# Patient Record
Sex: Female | Born: 1935 | Race: White | Hispanic: No | Smoking: Former smoker
Health system: Southern US, Community
[De-identification: ages and names within clinical notes are randomized; demographics above are authoritative.]

## PROBLEM LIST (undated history)

## (undated) DIAGNOSIS — K5909 Other constipation: Secondary | ICD-10-CM

## (undated) DIAGNOSIS — M5416 Radiculopathy, lumbar region: Secondary | ICD-10-CM

## (undated) DIAGNOSIS — IMO0001 Reserved for inherently not codable concepts without codable children: Secondary | ICD-10-CM

## (undated) DIAGNOSIS — E7800 Pure hypercholesterolemia, unspecified: Secondary | ICD-10-CM

## (undated) DIAGNOSIS — F411 Generalized anxiety disorder: Secondary | ICD-10-CM

## (undated) DIAGNOSIS — I700 Atherosclerosis of aorta: Secondary | ICD-10-CM

## (undated) DIAGNOSIS — E785 Hyperlipidemia, unspecified: Secondary | ICD-10-CM

## (undated) DIAGNOSIS — G43909 Migraine, unspecified, not intractable, without status migrainosus: Secondary | ICD-10-CM

## (undated) DIAGNOSIS — K219 Gastro-esophageal reflux disease without esophagitis: Secondary | ICD-10-CM

## (undated) DIAGNOSIS — J40 Bronchitis, not specified as acute or chronic: Secondary | ICD-10-CM

## (undated) DIAGNOSIS — J309 Allergic rhinitis, unspecified: Secondary | ICD-10-CM

## (undated) DIAGNOSIS — M129 Arthropathy, unspecified: Secondary | ICD-10-CM

## (undated) DIAGNOSIS — I10 Essential (primary) hypertension: Secondary | ICD-10-CM

## (undated) DIAGNOSIS — F410 Panic disorder [episodic paroxysmal anxiety] without agoraphobia: Secondary | ICD-10-CM

## (undated) DIAGNOSIS — D509 Iron deficiency anemia, unspecified: Secondary | ICD-10-CM

## (undated) DIAGNOSIS — H269 Unspecified cataract: Secondary | ICD-10-CM

## (undated) DIAGNOSIS — J45909 Unspecified asthma, uncomplicated: Secondary | ICD-10-CM

## (undated) DIAGNOSIS — N3010 Interstitial cystitis (chronic) without hematuria: Secondary | ICD-10-CM

## (undated) HISTORY — PX: ROTATOR CUFF REPAIR: SHX139

## (undated) HISTORY — DX: Essential (primary) hypertension: I10

## (undated) HISTORY — PX: TOTAL SHOULDER ARTHROPLASTY: SHX126

## (undated) HISTORY — PX: APPENDECTOMY: SHX54

## (undated) HISTORY — DX: Unspecified cataract: H26.9

## (undated) HISTORY — PX: BACK SURGERY: SHX140

## (undated) HISTORY — DX: Generalized anxiety disorder: F41.1

## (undated) HISTORY — PX: ABDOMINAL HYSTERECTOMY: SHX81

## (undated) HISTORY — PX: LUMBAR DISC SURGERY: SHX700

## (undated) HISTORY — DX: Radiculopathy, lumbar region: M54.16

## (undated) HISTORY — DX: Panic disorder (episodic paroxysmal anxiety): F41.0

## (undated) HISTORY — DX: Bronchitis, not specified as acute or chronic: J40

## (undated) HISTORY — PX: CATARACT EXTRACTION: SUR2

## (undated) HISTORY — DX: Pure hypercholesterolemia, unspecified: E78.00

## (undated) HISTORY — DX: Atherosclerosis of aorta: I70.0

## (undated) HISTORY — DX: Hyperlipidemia, unspecified: E78.5

## (undated) HISTORY — DX: Other constipation: K59.09

## (undated) HISTORY — PX: DOPPLER ECHOCARDIOGRAPHY: SHX263

## (undated) HISTORY — PX: ESOPHAGOGASTRODUODENOSCOPY ENDOSCOPY: SHX5814

## (undated) HISTORY — PX: JOINT REPLACEMENT: SHX530

## (undated) HISTORY — DX: Reserved for inherently not codable concepts without codable children: IMO0001

## (undated) HISTORY — DX: Allergic rhinitis, unspecified: J30.9

## (undated) HISTORY — PX: TOTAL HIP ARTHROPLASTY: SHX124

## (undated) HISTORY — DX: Iron deficiency anemia, unspecified: D50.9

## (undated) HISTORY — PX: OTHER SURGICAL HISTORY: SHX169

## (undated) HISTORY — DX: Migraine, unspecified, not intractable, without status migrainosus: G43.909

## (undated) HISTORY — DX: Unspecified asthma, uncomplicated: J45.909

## (undated) HISTORY — PX: NM MYOVIEW LTD: HXRAD82

## (undated) HISTORY — PX: COLONOSCOPY: SHX174

## (undated) HISTORY — DX: Arthropathy, unspecified: M12.9

## (undated) HISTORY — DX: Gastro-esophageal reflux disease without esophagitis: K21.9

## (undated) HISTORY — DX: Interstitial cystitis (chronic) without hematuria: N30.10

---

## 1997-10-04 ENCOUNTER — Inpatient Hospital Stay (HOSPITAL_COMMUNITY): Admission: RE | Admit: 1997-10-04 | Discharge: 1997-10-10 | Payer: Self-pay | Admitting: Orthopedic Surgery

## 1997-10-11 ENCOUNTER — Encounter (HOSPITAL_COMMUNITY): Admission: RE | Admit: 1997-10-11 | Discharge: 1998-01-09 | Payer: Self-pay | Admitting: Orthopedic Surgery

## 1998-03-28 ENCOUNTER — Encounter: Payer: Self-pay | Admitting: Orthopedic Surgery

## 1998-04-04 ENCOUNTER — Inpatient Hospital Stay (HOSPITAL_COMMUNITY): Admission: RE | Admit: 1998-04-04 | Discharge: 1998-04-08 | Payer: Self-pay | Admitting: Orthopedic Surgery

## 1998-04-04 ENCOUNTER — Encounter: Payer: Self-pay | Admitting: Orthopedic Surgery

## 1998-04-11 ENCOUNTER — Encounter (HOSPITAL_COMMUNITY): Admission: RE | Admit: 1998-04-11 | Discharge: 1998-07-10 | Payer: Self-pay | Admitting: Orthopedic Surgery

## 1998-05-09 ENCOUNTER — Encounter: Payer: Self-pay | Admitting: Endocrinology

## 1998-05-09 ENCOUNTER — Ambulatory Visit (HOSPITAL_COMMUNITY): Admission: RE | Admit: 1998-05-09 | Discharge: 1998-05-09 | Payer: Self-pay | Admitting: Endocrinology

## 1999-05-15 ENCOUNTER — Encounter: Payer: Self-pay | Admitting: Endocrinology

## 1999-05-15 ENCOUNTER — Ambulatory Visit (HOSPITAL_COMMUNITY): Admission: RE | Admit: 1999-05-15 | Discharge: 1999-05-15 | Payer: Self-pay | Admitting: Endocrinology

## 1999-06-21 ENCOUNTER — Other Ambulatory Visit: Admission: RE | Admit: 1999-06-21 | Discharge: 1999-06-21 | Payer: Self-pay | Admitting: Endocrinology

## 2000-05-16 ENCOUNTER — Encounter: Payer: Self-pay | Admitting: Endocrinology

## 2000-05-16 ENCOUNTER — Ambulatory Visit (HOSPITAL_COMMUNITY): Admission: RE | Admit: 2000-05-16 | Discharge: 2000-05-16 | Payer: Self-pay | Admitting: Endocrinology

## 2000-07-02 ENCOUNTER — Other Ambulatory Visit: Admission: RE | Admit: 2000-07-02 | Discharge: 2000-07-02 | Payer: Self-pay | Admitting: Endocrinology

## 2001-05-25 ENCOUNTER — Encounter: Payer: Self-pay | Admitting: Endocrinology

## 2001-05-25 ENCOUNTER — Ambulatory Visit (HOSPITAL_COMMUNITY): Admission: RE | Admit: 2001-05-25 | Discharge: 2001-05-25 | Payer: Self-pay | Admitting: Endocrinology

## 2001-08-06 ENCOUNTER — Other Ambulatory Visit: Admission: RE | Admit: 2001-08-06 | Discharge: 2001-08-06 | Payer: Self-pay | Admitting: Endocrinology

## 2002-02-16 ENCOUNTER — Encounter: Payer: Self-pay | Admitting: Critical Care Medicine

## 2002-05-17 ENCOUNTER — Encounter: Payer: Self-pay | Admitting: Critical Care Medicine

## 2002-06-14 ENCOUNTER — Ambulatory Visit (HOSPITAL_COMMUNITY): Admission: RE | Admit: 2002-06-14 | Discharge: 2002-06-14 | Payer: Self-pay | Admitting: Endocrinology

## 2002-06-14 ENCOUNTER — Encounter: Payer: Self-pay | Admitting: Endocrinology

## 2002-08-10 ENCOUNTER — Encounter: Payer: Self-pay | Admitting: Gastroenterology

## 2003-07-07 ENCOUNTER — Ambulatory Visit (HOSPITAL_COMMUNITY): Admission: RE | Admit: 2003-07-07 | Discharge: 2003-07-07 | Payer: Self-pay | Admitting: Endocrinology

## 2003-07-20 ENCOUNTER — Other Ambulatory Visit: Admission: RE | Admit: 2003-07-20 | Discharge: 2003-07-20 | Payer: Self-pay | Admitting: Endocrinology

## 2003-08-10 ENCOUNTER — Encounter: Payer: Self-pay | Admitting: Gastroenterology

## 2004-01-10 ENCOUNTER — Emergency Department (HOSPITAL_COMMUNITY): Admission: EM | Admit: 2004-01-10 | Discharge: 2004-01-10 | Payer: Self-pay | Admitting: Emergency Medicine

## 2004-07-09 ENCOUNTER — Ambulatory Visit (HOSPITAL_COMMUNITY): Admission: RE | Admit: 2004-07-09 | Discharge: 2004-07-09 | Payer: Self-pay | Admitting: Endocrinology

## 2005-03-30 ENCOUNTER — Emergency Department (HOSPITAL_COMMUNITY): Admission: EM | Admit: 2005-03-30 | Discharge: 2005-03-30 | Payer: Self-pay | Admitting: Emergency Medicine

## 2005-04-08 ENCOUNTER — Encounter: Admission: RE | Admit: 2005-04-08 | Discharge: 2005-04-08 | Payer: Self-pay | Admitting: Orthopedic Surgery

## 2005-04-12 ENCOUNTER — Inpatient Hospital Stay (HOSPITAL_COMMUNITY): Admission: RE | Admit: 2005-04-12 | Discharge: 2005-04-14 | Payer: Self-pay | Admitting: Orthopedic Surgery

## 2005-07-11 ENCOUNTER — Ambulatory Visit (HOSPITAL_COMMUNITY): Admission: RE | Admit: 2005-07-11 | Discharge: 2005-07-11 | Payer: Self-pay | Admitting: Endocrinology

## 2006-06-04 ENCOUNTER — Ambulatory Visit: Payer: Self-pay | Admitting: Gastroenterology

## 2006-06-30 ENCOUNTER — Encounter: Admission: RE | Admit: 2006-06-30 | Discharge: 2006-06-30 | Payer: Self-pay | Admitting: Orthopedic Surgery

## 2006-08-13 ENCOUNTER — Ambulatory Visit (HOSPITAL_COMMUNITY): Admission: RE | Admit: 2006-08-13 | Discharge: 2006-08-13 | Payer: Self-pay | Admitting: Endocrinology

## 2007-06-17 ENCOUNTER — Emergency Department (HOSPITAL_COMMUNITY): Admission: EM | Admit: 2007-06-17 | Discharge: 2007-06-17 | Payer: Self-pay | Admitting: Emergency Medicine

## 2007-08-05 ENCOUNTER — Encounter: Payer: Self-pay | Admitting: Critical Care Medicine

## 2007-08-12 DIAGNOSIS — F411 Generalized anxiety disorder: Secondary | ICD-10-CM | POA: Insufficient documentation

## 2007-08-12 DIAGNOSIS — E785 Hyperlipidemia, unspecified: Secondary | ICD-10-CM

## 2007-08-12 DIAGNOSIS — I10 Essential (primary) hypertension: Secondary | ICD-10-CM | POA: Insufficient documentation

## 2007-08-12 DIAGNOSIS — M1990 Unspecified osteoarthritis, unspecified site: Secondary | ICD-10-CM | POA: Insufficient documentation

## 2007-08-12 DIAGNOSIS — K219 Gastro-esophageal reflux disease without esophagitis: Secondary | ICD-10-CM | POA: Insufficient documentation

## 2007-08-12 DIAGNOSIS — J309 Allergic rhinitis, unspecified: Secondary | ICD-10-CM | POA: Insufficient documentation

## 2007-08-12 DIAGNOSIS — M797 Fibromyalgia: Secondary | ICD-10-CM

## 2007-08-12 DIAGNOSIS — Z8719 Personal history of other diseases of the digestive system: Secondary | ICD-10-CM

## 2007-08-12 DIAGNOSIS — M129 Arthropathy, unspecified: Secondary | ICD-10-CM

## 2007-08-12 HISTORY — DX: Hyperlipidemia, unspecified: E78.5

## 2007-08-12 HISTORY — DX: Gastro-esophageal reflux disease without esophagitis: K21.9

## 2007-08-12 HISTORY — DX: Allergic rhinitis, unspecified: J30.9

## 2007-08-12 HISTORY — DX: Essential (primary) hypertension: I10

## 2007-08-12 HISTORY — DX: Generalized anxiety disorder: F41.1

## 2007-08-12 HISTORY — DX: Unspecified osteoarthritis, unspecified site: M19.90

## 2007-08-13 ENCOUNTER — Ambulatory Visit: Payer: Self-pay | Admitting: Critical Care Medicine

## 2007-08-14 ENCOUNTER — Ambulatory Visit: Payer: Self-pay | Admitting: Cardiovascular Disease

## 2007-08-15 ENCOUNTER — Encounter: Payer: Self-pay | Admitting: Critical Care Medicine

## 2007-08-18 ENCOUNTER — Encounter: Payer: Self-pay | Admitting: Critical Care Medicine

## 2007-08-31 ENCOUNTER — Ambulatory Visit (HOSPITAL_COMMUNITY): Admission: RE | Admit: 2007-08-31 | Discharge: 2007-08-31 | Payer: Self-pay | Admitting: Endocrinology

## 2007-09-21 ENCOUNTER — Ambulatory Visit: Payer: Self-pay | Admitting: Critical Care Medicine

## 2007-12-10 ENCOUNTER — Ambulatory Visit: Payer: Self-pay | Admitting: Critical Care Medicine

## 2008-03-17 ENCOUNTER — Ambulatory Visit: Payer: Self-pay | Admitting: Critical Care Medicine

## 2008-03-17 ENCOUNTER — Telehealth (INDEPENDENT_AMBULATORY_CARE_PROVIDER_SITE_OTHER): Payer: Self-pay | Admitting: *Deleted

## 2008-03-22 ENCOUNTER — Telehealth (INDEPENDENT_AMBULATORY_CARE_PROVIDER_SITE_OTHER): Payer: Self-pay | Admitting: *Deleted

## 2008-03-23 ENCOUNTER — Ambulatory Visit: Payer: Self-pay | Admitting: Critical Care Medicine

## 2008-03-25 ENCOUNTER — Encounter: Payer: Self-pay | Admitting: Internal Medicine

## 2008-03-25 LAB — HM MAMMOGRAPHY

## 2008-04-01 ENCOUNTER — Telehealth: Payer: Self-pay | Admitting: Adult Health

## 2008-04-25 ENCOUNTER — Telehealth (INDEPENDENT_AMBULATORY_CARE_PROVIDER_SITE_OTHER): Payer: Self-pay | Admitting: *Deleted

## 2008-05-20 ENCOUNTER — Ambulatory Visit: Payer: Self-pay | Admitting: Critical Care Medicine

## 2008-08-18 ENCOUNTER — Ambulatory Visit: Payer: Self-pay | Admitting: Critical Care Medicine

## 2008-09-06 ENCOUNTER — Ambulatory Visit: Payer: Self-pay | Admitting: Critical Care Medicine

## 2008-09-16 ENCOUNTER — Telehealth: Payer: Self-pay | Admitting: Adult Health

## 2008-12-19 ENCOUNTER — Ambulatory Visit: Payer: Self-pay | Admitting: Critical Care Medicine

## 2008-12-19 ENCOUNTER — Ambulatory Visit (HOSPITAL_COMMUNITY): Admission: RE | Admit: 2008-12-19 | Discharge: 2008-12-19 | Payer: Self-pay | Admitting: Endocrinology

## 2008-12-19 DIAGNOSIS — J4530 Mild persistent asthma, uncomplicated: Secondary | ICD-10-CM

## 2008-12-19 HISTORY — DX: Mild persistent asthma, uncomplicated: J45.30

## 2009-01-11 ENCOUNTER — Telehealth: Payer: Self-pay | Admitting: Critical Care Medicine

## 2009-01-11 ENCOUNTER — Ambulatory Visit: Payer: Self-pay | Admitting: Critical Care Medicine

## 2009-03-01 ENCOUNTER — Ambulatory Visit: Payer: Self-pay | Admitting: Critical Care Medicine

## 2009-03-03 ENCOUNTER — Encounter: Payer: Self-pay | Admitting: Critical Care Medicine

## 2009-06-13 ENCOUNTER — Telehealth: Payer: Self-pay | Admitting: Critical Care Medicine

## 2009-06-21 ENCOUNTER — Ambulatory Visit: Payer: Self-pay | Admitting: Critical Care Medicine

## 2009-09-18 ENCOUNTER — Telehealth: Payer: Self-pay | Admitting: Gastroenterology

## 2009-10-04 ENCOUNTER — Telehealth: Payer: Self-pay | Admitting: Critical Care Medicine

## 2009-10-04 ENCOUNTER — Ambulatory Visit: Payer: Self-pay | Admitting: Critical Care Medicine

## 2009-10-12 ENCOUNTER — Encounter: Payer: Self-pay | Admitting: Gastroenterology

## 2009-10-13 ENCOUNTER — Ambulatory Visit: Payer: Self-pay | Admitting: Critical Care Medicine

## 2009-10-13 DIAGNOSIS — J209 Acute bronchitis, unspecified: Secondary | ICD-10-CM

## 2009-10-13 LAB — CONVERTED CEMR LAB
Eosinophils Absolute: 0.2 10*3/uL (ref 0.0–0.7)
Eosinophils Relative: 3.2 % (ref 0.0–5.0)
HCT: 31.5 % — ABNORMAL LOW (ref 36.0–46.0)
Lymphs Abs: 1.3 10*3/uL (ref 0.7–4.0)
MCHC: 33.3 g/dL (ref 30.0–36.0)
MCV: 94.7 fL (ref 78.0–100.0)
Monocytes Absolute: 0.4 10*3/uL (ref 0.1–1.0)
Neutrophils Relative %: 60.8 % (ref 43.0–77.0)
Platelets: 152 10*3/uL (ref 150.0–400.0)
WBC: 4.8 10*3/uL (ref 4.5–10.5)

## 2009-10-16 ENCOUNTER — Encounter: Payer: Self-pay | Admitting: Gastroenterology

## 2009-10-17 ENCOUNTER — Encounter: Payer: Self-pay | Admitting: Gastroenterology

## 2009-10-23 ENCOUNTER — Ambulatory Visit: Payer: Self-pay | Admitting: Gastroenterology

## 2009-10-23 DIAGNOSIS — R1319 Other dysphagia: Secondary | ICD-10-CM

## 2009-10-24 ENCOUNTER — Encounter: Payer: Self-pay | Admitting: Gastroenterology

## 2009-10-24 LAB — CONVERTED CEMR LAB
Ferritin: 68.1 ng/mL (ref 10.0–291.0)
Folate: >20 ng/mL
Iron: 53 ug/dL (ref 42–145)
Vitamin B-12: 496 pg/mL (ref 211–911)

## 2009-11-03 ENCOUNTER — Ambulatory Visit: Payer: Self-pay | Admitting: Critical Care Medicine

## 2009-11-03 DIAGNOSIS — D509 Iron deficiency anemia, unspecified: Secondary | ICD-10-CM | POA: Insufficient documentation

## 2009-11-03 HISTORY — DX: Iron deficiency anemia, unspecified: D50.9

## 2009-11-03 LAB — CONVERTED CEMR LAB
Basophils Relative: 0.3 % (ref 0.0–3.0)
HCT: 31.3 % — ABNORMAL LOW (ref 36.0–46.0)
Hemoglobin: 10.5 g/dL — ABNORMAL LOW (ref 12.0–15.0)
Lymphocytes Relative: 25 % (ref 12.0–46.0)
Lymphs Abs: 1.3 10*3/uL (ref 0.7–4.0)
Monocytes Relative: 7.9 % (ref 3.0–12.0)
Neutro Abs: 3.5 10*3/uL (ref 1.4–7.7)
RBC: 3.26 M/uL — ABNORMAL LOW (ref 3.87–5.11)
RDW: 17.2 % — ABNORMAL HIGH (ref 11.5–14.6)

## 2009-11-08 ENCOUNTER — Encounter: Payer: Self-pay | Admitting: Gastroenterology

## 2009-11-13 ENCOUNTER — Ambulatory Visit: Payer: Self-pay | Admitting: Gastroenterology

## 2009-11-13 DIAGNOSIS — R141 Gas pain: Secondary | ICD-10-CM

## 2009-11-13 DIAGNOSIS — R143 Flatulence: Secondary | ICD-10-CM

## 2009-11-13 DIAGNOSIS — R142 Eructation: Secondary | ICD-10-CM

## 2009-11-14 ENCOUNTER — Ambulatory Visit: Payer: Self-pay | Admitting: Gastroenterology

## 2009-11-16 ENCOUNTER — Encounter: Payer: Self-pay | Admitting: Gastroenterology

## 2009-11-17 ENCOUNTER — Telehealth: Payer: Self-pay | Admitting: Gastroenterology

## 2009-11-17 ENCOUNTER — Ambulatory Visit: Payer: Self-pay | Admitting: Oncology

## 2009-11-20 ENCOUNTER — Encounter: Payer: Self-pay | Admitting: Gastroenterology

## 2009-11-20 LAB — CBC & DIFF AND RETIC
BASO%: 0.4 % (ref 0.0–2.0)
HCT: 37.7 % (ref 34.8–46.6)
Immature Retic Fract: 9 % (ref 0.00–10.70)
MCHC: 31.8 g/dL (ref 31.5–36.0)
MONO#: 0.4 10*3/uL (ref 0.1–0.9)
RBC: 3.88 10*6/uL (ref 3.70–5.45)
Retic %: 0.67 % (ref 0.50–1.50)
WBC: 5.6 10*3/uL (ref 3.9–10.3)
lymph#: 1.2 10*3/uL (ref 0.9–3.3)

## 2009-11-20 LAB — COMPREHENSIVE METABOLIC PANEL
ALT: 25 U/L (ref 0–35)
Alkaline Phosphatase: 65 U/L (ref 39–117)
Sodium: 142 mEq/L (ref 135–145)
Total Bilirubin: 0.5 mg/dL (ref 0.3–1.2)
Total Protein: 6.8 g/dL (ref 6.0–8.3)

## 2010-02-16 ENCOUNTER — Ambulatory Visit: Payer: Self-pay | Admitting: Oncology

## 2010-02-20 LAB — CBC WITH DIFFERENTIAL/PLATELET
BASO%: 0.4 % (ref 0.0–2.0)
EOS%: 2.9 % (ref 0.0–7.0)
HCT: 37.3 % (ref 34.8–46.6)
LYMPH%: 27.2 % (ref 14.0–49.7)
MCH: 30.6 pg (ref 25.1–34.0)
MCHC: 33.9 g/dL (ref 31.5–36.0)
MONO#: 0.3 10*3/uL (ref 0.1–0.9)
NEUT%: 62 % (ref 38.4–76.8)
Platelets: 155 10*3/uL (ref 145–400)

## 2010-02-20 LAB — IRON AND TIBC: %SAT: 24 % (ref 20–55)

## 2010-03-05 ENCOUNTER — Ambulatory Visit: Payer: Self-pay | Admitting: Critical Care Medicine

## 2010-03-13 ENCOUNTER — Ambulatory Visit: Payer: Self-pay | Admitting: Critical Care Medicine

## 2010-04-15 ENCOUNTER — Encounter: Payer: Self-pay | Admitting: Orthopedic Surgery

## 2010-04-16 ENCOUNTER — Ambulatory Visit
Admission: RE | Admit: 2010-04-16 | Discharge: 2010-04-16 | Payer: Self-pay | Source: Home / Self Care | Attending: Critical Care Medicine | Admitting: Critical Care Medicine

## 2010-04-26 NOTE — Assessment & Plan Note (Signed)
Summary: Pulmonary OV   Visit Type:  Follow-up Copy to:  Ranjan sharma Primary Provider/Referring Provider:  Dr. Adela Lank  CC:  Asthma.  The patient has no complaints today. Marland Kitchen  History of Present Illness: 77  years old female who presents with asthma with reflux disease and mild sinusitis as triggers  for the patient's asthma.  The patient had received immunotherapy in the past without improvement.  She had did have positive skin testing.  However, her IgE level obtained at the last visit was only 4.5.  Sinus CT scan showed only mild sinusitis without overt infection.  She is not a Xolair candidate due to the low IgE levels.     October 04, 2009 10:39 AM Doing welll No new issues.   Pt denies any significant sore throat, nasal congestion or excess secretions, fever, chills, sweats, unintended weight loss, pleurtic or exertional chest pain, orthopnea PND, or leg swelling Pt denies any increase in rescue therapy over baseline, denies waking up needing it or having any early am or nocturnal exacerbations of coughing/wheezing/or dyspnea.   Asthma History    Asthma Control Assessment:    Age range: 12+ years    Symptoms: 0-2 days/week    Nighttime Awakenings: 0-2/month    Interferes w/ normal activity: no limitations    SABA use (not for EIB): 0-2 days/week    ATAQ questionnaire: 0    FEV1: 2.05 liters (today)    FEV1 Pred: 2.20 liters (today)    Exacerbations requiring oral systemic steroids: 0-1/year    Asthma Control Assessment: Well Controlled   Preventive Screening-Counseling & Management  Alcohol-Tobacco     Smoking Status: quit > 6 months     Year Quit: 1970     Pack years: 2 cigarettes daily x10 years  Current Medications (verified): 1)  Meloxicam 15 Mg  Tabs (Meloxicam) .... One Tablet Daily 2)  Diovan 160 Mg  Tabs (Valsartan) .... One Tablet Daily. 3)  Vitamin B-1 100 Mg  Tabs (Thiamine Hcl) .... One As Needed 4)  Multivitamins   Tabs (Multiple Vitamin) .... One  Tablet Daily. 5)  Zyrtec Allergy 10 Mg  Tabs (Cetirizine Hcl) .... By Mouth Daily 6)  Singulair 10 Mg  Tabs (Montelukast Sodium) .... One Tablet Daily. 7)  Paxil Cr 25 Mg Xr24h-Tab (Paroxetine Hcl) .Marland Kitchen.. 1 By Mouth Daily 8)  Proair Hfa 108 (90 Base) Mcg/act  Aers (Albuterol Sulfate) .... Inhale 2 Puffs Every 4-6 Hours As Needed. 9)  Skelaxin 800 Mg  Tabs (Metaxalone) .... Take 1/2 As Needed 10)  Crestor 20 Mg  Tabs (Rosuvastatin Calcium) .... One Tablet Daily. 11)  Actos 30 Mg  Tabs (Pioglitazone Hcl) .Marland Kitchen.. 1 By Mouth Daily 12)  Clonazepam 0.5 Mg  Tabs (Clonazepam) .... 1/2 By Mouth Dailly 13)  Pulmicort Flexhaler 180 Mcg/act  Inha (Budesonide (Inhalation)) .... Two  Puffs Twice Daily 14)  Fluticasone Propionate 50 Mcg/act Susp (Fluticasone Propionate) .... Two Sprays Each Nostril Daily  Allergies (verified): 1)  ! * Morphine Sulfate  Past History:  Past medical, surgical, family and social histories (including risk factors) reviewed, and no changes noted (except as noted below).  Past Medical History: Current Problems:  CONSTIPATION, CHRONIC, HX OF (ICD-V12.79) * S/P HYSTERECTOMY ANXIETY (ICD-300.00) FIBROMYALGIA (ICD-729.1) ARTHRITIS (ICD-716.90) HYPERTENSION (ICD-401.9) HYPERLIPIDEMIA (ICD-272.4) G E R D (ICD-530.81) ALLERGIC RHINITIS (ICD-477.9) Asthma Interstitial Cystitis     -Darvin Neighbours GU  Past Surgical History: Reviewed history from 08/12/2007 and no changes required. Cataract Extraction Total Hip Arthroplasty (bilateral)  Status post hysterectomy  Past Pulmonary History:  Pulmonary History:      Exam Type: CT Sinus Results: IMPRESSION: Very mild chronic sinusitis bilaterally.  Pulmonary Function Test  Date: 08/13/2007 Height (in.): 66 Gender: Female  Pre-Spirometry  FVC     Value: 2.68 L/min   Pred: 2.94 L/min     % Pred: 91 % FEV1     Value: 2.05 L     Pred: 2.20 L     % Pred: 93 % FEV1/FVC   Value: 76 %     Pred: 77 %     FEF 25-75   Value: 1.72 L/min    Pred: 1.94 L/min     % Pred: 88 %  Comments:  Normal spirometry  Evaluation:  normal  Family History: Reviewed history from 08/13/2007 and no changes required. non contrib  Social History: Reviewed history from 08/13/2007 and no changes required. Former smoker Married with two children Retired Scientist, clinical (histocompatibility and immunogenetics)  Review of Systems  The patient denies shortness of breath with activity, shortness of breath at rest, productive cough, non-productive cough, coughing up blood, chest pain, irregular heartbeats, acid heartburn, indigestion, loss of appetite, weight change, abdominal pain, difficulty swallowing, sore throat, tooth/dental problems, headaches, nasal congestion/difficulty breathing through nose, sneezing, itching, ear ache, anxiety, depression, hand/feet swelling, joint stiffness or pain, rash, change in color of mucus, and fever.    Vital Signs:  Patient profile:   77 year old female Height:      65 inches (165.10 cm) Weight:      164 pounds (74.55 kg) BMI:     27.39 O2 Sat:      94 % on Room air Temp:     97.7 degrees F (36.50 degrees C) oral Pulse rate:   95 / minute BP sitting:   136 / 70  (right arm) Cuff size:   regular  Vitals Entered By: Michel Bickers CMA (October 04, 2009 10:35 AM)  O2 Sat at Rest %:  94 O2 Flow:  Room air CC: Asthma.  The patient has no complaints today.  Comments Medications reviewed. Daytime phone verified. Michel Bickers [facility]  October 04, 2009 10:36 AM   Physical Exam  Additional Exam:  Gen: Pleasant, well nourished, in no distress ENT: no lesions, clear discharge.  Neck: No JVD, no TMG, no carotid bruits Lungs:clear Cardiovascular: RRR, heart sounds normal, no murmurs or gallops, no peripheral edema Musculoskeletal: No deformities, no cyanosis or clubbing     Pre-Spirometry FEV1    Value: 2.05 L     Pred: 2.20 L     Impression & Recommendations:  Problem # 1:  ASTHMA, EXTRINSIC (ICD-493.00) Assessment Improved  Moderate persistent asthma  improved plan Try to eliminate LABA and go with ICS alone >>>pulmicort   Medications Added to Medication List This Visit: 1)  Pulmicort Flexhaler 180 Mcg/act Inha (Budesonide (inhalation)) .... Two  puffs twice daily  Complete Medication List: 1)  Meloxicam 15 Mg Tabs (Meloxicam) .... One tablet daily 2)  Diovan 160 Mg Tabs (Valsartan) .... One tablet daily. 3)  Vitamin B-1 100 Mg Tabs (Thiamine hcl) .... One as needed 4)  Multivitamins Tabs (Multiple vitamin) .... One tablet daily. 5)  Zyrtec Allergy 10 Mg Tabs (Cetirizine hcl) .... By mouth daily 6)  Singulair 10 Mg Tabs (Montelukast sodium) .... One tablet daily. 7)  Paxil Cr 25 Mg Xr24h-tab (Paroxetine hcl) .Marland Kitchen.. 1 by mouth daily 8)  Proair Hfa 108 (90 Base) Mcg/act Aers (Albuterol sulfate) .... Inhale 2  puffs every 4-6 hours as needed. 9)  Skelaxin 800 Mg Tabs (Metaxalone) .... Take 1/2 as needed 10)  Crestor 20 Mg Tabs (Rosuvastatin calcium) .... One tablet daily. 11)  Actos 30 Mg Tabs (Pioglitazone hcl) .Marland Kitchen.. 1 by mouth daily 12)  Clonazepam 0.5 Mg Tabs (Clonazepam) .... 1/2 by mouth dailly 13)  Pulmicort Flexhaler 180 Mcg/act Inha (Budesonide (inhalation)) .... Two  puffs twice daily 14)  Fluticasone Propionate 50 Mcg/act Susp (Fluticasone propionate) .... Two sprays each nostril daily 15)  Phenazopyridine Hcl 200 Mg Tabs (Phenazopyridine hcl) .... One by mouth two times a day  Other Orders: Est. Patient Level IV (78295)  Patient Instructions: 1)  Stop symbicort one week before cannister runs out then start Pulmicort two puff twice daily 2)  No other medication changes 3)  Call with report on how you are doing on pulmicort 4)  Return 4 months Prescriptions: PULMICORT FLEXHALER 180 MCG/ACT  INHA (BUDESONIDE (INHALATION)) Two  puffs twice daily  #1 x 6   Entered and Authorized by:   Storm Frisk MD   Signed by:   Storm Frisk MD on 10/04/2009   Method used:   Print then Give to Patient   RxID:    [id number]     Appended Document: Pulmonary OV fax dennis kohut

## 2010-04-26 NOTE — Letter (Signed)
Summary: Diabetic Instructions  [facility]  [address] [place], [place] [address]   Phone: [phone]  Fax: [phone]    [name] 15-Sep-1933 MRN: [id number]   _ x _   ORAL DIABETIC MEDICATION INSTRUCTIONS  The day before your procedure:   Take your diabetic pill as you do normally  The day of your procedure:   Do not take your diabetic pill    We will check your blood sugar levels during the admission process and again in Recovery before discharging you home

## 2010-04-26 NOTE — Progress Notes (Signed)
Summary: triage   Phone Note Call from Patient   Caller: Patient Call For: Dr. Russella Dar Reason for Call: Talk to Nurse Summary of Call: would like to be worked in for dysphagia Initial call taken by: Vallarie Mare,  September 18, 2009 3:51 PM  Follow-up for Phone Call        Patient c/o 4-5 episodes of dysphagia mostly with chips.  Patient  advised to eat her food slowly, chew her bites carefully.  She is scheduled to see Dr Russella Dar 10/23/09 3:00 Follow-up by: Darcey Nora RN, CGRN,  September 18, 2009 4:05 PM

## 2010-04-26 NOTE — Progress Notes (Signed)
Summary: talk to nurse  Phone Note Call from Patient Call back at Home Phone [phone]   Caller: Patient Call For: wright Summary of Call: Pt states that PW wanted her to call and give him the name of a new med she was taking, it is phenazopyridine hcl 200mg  and she takes one tab 2x a day. Initial call taken by: Darletta Moll,  October 04, 2009 2:32 PM  Follow-up for Phone Call        called and spoke with pt and she stated that the new med that she is on--phenazopyridine hcl 200mg   1 by mouth two times a day  and she has been taking this for 2 wks.  FYI for Dr. Delford Field. Randell Loop [facility]  October 04, 2009 2:51 PM   Additional Follow-up for Phone Call Additional follow up Details #1::        noted Additional Follow-up by: Storm Frisk MD,  October 04, 2009 3:07 PM    New/Updated Medications: PHENAZOPYRIDINE HCL 200 MG TABS (PHENAZOPYRIDINE HCL) one by mouth two times a day

## 2010-04-26 NOTE — Assessment & Plan Note (Signed)
Summary: F/U APPT, tightness in chest, belching...LSW.   History of Present Illness Visit Type: Follow-up Visit Primary GI MD: Elie Goody MD [facility] Primary Provider: Adela Lank, MD Chief Complaint: Pt states she is having 4-5 firm, black stools per day.  Her GERD is better, although she still has some belching.   History of Present Illness:   This is a return office visit for GERD, dysphagia, diarrhea, and iron deficiency anemia. Her reflux symptoms have improved with the regular use of omeprazole, but she has persistent problems with 4-5 stools per day, increased flatus, and intermittent solid dysphagia. Stools have been black since beginning iron.  Iron studies returned showing a saturation of 16.4%. Her hemoglobin has remained stable at 10.5.   GI Review of Systems    Reports bloating and  dysphagia with solids.      Denies abdominal pain, acid reflux, belching, chest pain, dysphagia with liquids, heartburn, loss of appetite, nausea, vomiting, vomiting blood, weight loss, and  weight gain.      Reports black tarry stools.     Denies anal fissure, change in bowel habit, constipation, diarrhea, diverticulosis, fecal incontinence, heme positive stool, hemorrhoids, irritable bowel syndrome, jaundice, light color stool, liver problems, rectal bleeding, and  rectal pain.   Current Medications (verified): 1)  Meloxicam 15 Mg  Tabs (Meloxicam) .... One Tablet Daily Hold For Now 2)  Diovan 160 Mg  Tabs (Valsartan) .... One Tablet Daily. 3)  Multivitamins   Tabs (Multiple Vitamin) .... One Tablet Daily. 4)  Zyrtec Allergy 10 Mg  Tabs (Cetirizine Hcl) .... By Mouth Daily 5)  Singulair 10 Mg  Tabs (Montelukast Sodium) .... One Tablet Daily. 6)  Paxil Cr 25 Mg Xr24h-Tab (Paroxetine Hcl) .Marland Kitchen.. 1 By Mouth Daily 7)  Proair Hfa 108 (90 Base) Mcg/act  Aers (Albuterol Sulfate) .... Inhale 2 Puffs Every 4-6 Hours As Needed. 8)  Skelaxin 800 Mg  Tabs (Metaxalone) .... Take 1/2 As Needed 9)  Actos 30  Mg  Tabs (Pioglitazone Hcl) .Marland Kitchen.. 1 By Mouth Daily 10)  Clonazepam 0.5 Mg  Tabs (Clonazepam) .... 1/2 By Mouth Dailly 11)  Symbicort 160-4.5 Mcg/act  Aero (Budesonide-Formoterol Fumarate) .... Two Puffs Twice Daily Cancel Pulmicort Rx 12)  Fluticasone Propionate 50 Mcg/act Susp (Fluticasone Propionate) .... Two Sprays Each Nostril Daily 13)  Super Calcium/d 600-125 Mg-Unit Tabs (Calcium-Vitamin D) .... Take Two By Mouth Once Daily 14)  Omeprazole 20 Mg Cpdr (Omeprazole) .... One Tablet By Mouth Once Daily 15)  Iron 325 (65 Fe) Mg Tabs (Ferrous Sulfate) .Marland Kitchen.. 1 By Mouth Two Times A Day 16)  Tylenol Arthritis Pain 650 Mg Cr-Tabs (Acetaminophen) .... Take 2 Tablets By Mouth Two Times A Day  Allergies (verified): 1)  ! * Morphine Sulfate  Past History:  Past Medical History: Reviewed history from 10/23/2009 and no changes required. CONSTIPATION, CHRONIC, HX OF (ICD-V12.79) ANXIETY (ICD-300.00) FIBROMYALGIA (ICD-729.1) ARTHRITIS (ICD-716.90) HYPERTENSION (ICD-401.9) HYPERLIPIDEMIA (ICD-272.4) GERD (ICD-530.81) ALLERGIC RHINITIS (ICD-477.9) Asthma Hemorrhoids, internal and external Interstitial Cystitis     -Darvin Neighbours GU  Past Surgical History: Reviewed history from 10/23/2009 and no changes required. Cataract Extraction bilateral Total Hip Arthroplasty (bilateral) Hysterectomy Ovarian cyst removed Lumbar disc surgery  Family History: Reviewed history from 10/23/2009 and no changes required. non contrib No FH of Colon Cancer: Family History of Heart Disease: father  Social History: Former smoker Married with two children Retired Scientist, clinical (histocompatibility and immunogenetics) Alcohol Use - no Daily Caffeine Use coffee in AM Illicit Drug Use - no  Review of  Systems       The patient complains of arthritis/joint pain and fatigue.         The pertinent positives and negatives are noted as above and in the HPI. All other ROS were reviewed and were negative.   Vital Signs:  Patient profile:   77 year old  female Height:      65 inches Weight:      166 pounds BMI:     27.72 Pulse rate:   64 / minute Pulse rhythm:   irregular BP sitting:   114 / 72  (left arm) Cuff size:   regular  Vitals Entered By: Francee Piccolo CMA Duncan Dull) (November 13, 2009 3:21 PM)  Physical Exam  General:  Well developed, well nourished, no acute distress. Head:  Normocephalic and atraumatic. Eyes:  PERRLA, no icterus. Mouth:  No deformity or lesions, dentition normal. Lungs:  Clear throughout to auscultation. Heart:  Regular rate and rhythm; no murmurs, rubs,  or bruits. Abdomen:  Soft, nontender and nondistended. No masses, hepatosplenomegaly or hernias noted. Normal bowel sounds. Rectal:  deferred until time of colonoscopy.   Psych:  Alert and cooperative. Normal mood and affect.  Impression & Recommendations:  Problem # 1:  ANEMIA, IRON DEFICIENCY (ICD-280.9) Iron deficiency anemia associated with diarrhea, GERD, flatus, and dysphagia. Rule out colorectal neoplasms, IBD, ulcer disease, AVMs, and other disorders. Continue omeprazole 20 mg daily. Continue iron replacement. Continue to avoid Mexilocam until endoscopy and colonoscopy are completed. The risks, benefits and alternatives to colonoscopy with possible biopsy and possible polypectomy were discussed with the patient and they consent to proceed. The procedure will be scheduled electively. The risks, benefits and alternatives to endoscopy with possible biopsy and possible dilation were discussed with the patient and they consent to proceed. The procedure will be scheduled electively.  Problem # 2:  DYSPHAGIA (ICD-787.29) Rule out strictures, neoplasms and esophagitis. EGD as above.  Problem # 3:  DIARRHEA (ICD-787.91) See problem #1.  Problem # 4:  FLATULENCE ERUCTATION AND GAS PAIN (ICD-787.3) Begin Gas-X t.i.d. as needed.  Other Orders: Colon/Endo (Colon/Endo)  Patient Instructions: 1)  Colonoscopy brochure given.  2)  Upper Endoscopy brochure  given.  3)  Copy sent to : Adela Lank, MD 4)  The medication list was reviewed and reconciled.  All changed / newly prescribed medications were explained.  A complete medication list was provided to the patient / caregiver.  Prescriptions: MOVIPREP 100 GM  SOLR (PEG-KCL-NACL-NASULF-NA ASC-C) As per prep instructions.  #1 x 0   Entered by:   Christie Nottingham CMA (AAMA)   Authorized by:   Meryl Dare MD [facility]   Signed by:   Meryl Dare MD [facility] on 11/13/2009   Method used:   Electronically to        [facility]* (retail)       [address] [place], [place]  [address]       Ph: [phone] or [phone]       Fax: [phone]   RxID:   [id number]

## 2010-04-26 NOTE — Letter (Signed)
Summary: [facility] Instructions  [facility]  [address] [place], [place] [address]   Phone: [phone]  Fax: [phone]       [name]    July 01, 1933    MRN: [id number]        Procedure Day /Date: Tuesday August 23rd, 2011     Arrival Time: 12:30pm     Procedure Time: 1:30pmx     Location of Procedure:                    _x _  [facility] (4th Floor)                        PREPARATION FOR COLONOSCOPY WITH MOVIPREP   Starting 5 days prior to your procedure today do not eat nuts, seeds, popcorn, corn, beans, peas,  salads, or any raw vegetables.  Do not take any fiber supplements (e.g. Metamucil, Citrucel, and Benefiber).  THE DAY BEFORE YOUR PROCEDURE         DATE:11/13/09  DAY: Today  1.  Drink clear liquids the entire day-NO SOLID FOOD  2.  Do not drink anything colored red or purple.  Avoid juices with pulp.  No orange juice.  3.  Drink at least 64 oz. (8 glasses) of fluid/clear liquids during the day to prevent dehydration and help the prep work efficiently.  CLEAR LIQUIDS INCLUDE: Water Jello Ice Popsicles Tea (sugar ok, no milk/cream) Powdered fruit flavored drinks Coffee (sugar ok, no milk/cream) Gatorade Juice: apple, white grape, white cranberry  Lemonade Clear bullion, consomm, broth Carbonated beverages (any kind) Strained chicken noodle soup Hard Candy                             4.  In the morning, mix first dose of MoviPrep solution:    Empty 1 Pouch A and 1 Pouch B into the disposable container    Add lukewarm drinking water to the top line of the container. Mix to dissolve    Refrigerate (mixed solution should be used within 24 hrs)  5.  Begin drinking the prep at 5:00 p.m. The MoviPrep container is divided by 4 marks.   Every 15 minutes drink the solution down to the next mark (approximately 8 oz) until the full liter is complete.   6.  Follow completed prep with 16 oz of clear liquid of your choice  (Nothing red or purple).  Continue to drink clear liquids until bedtime.  7.  Before going to bed, mix second dose of MoviPrep solution:    Empty 1 Pouch A and 1 Pouch B into the disposable container    Add lukewarm drinking water to the top line of the container. Mix to dissolve    Refrigerate  THE DAY OF YOUR PROCEDURE      DATE: 11/14/09 DAY: Tuesday  Beginning at 8:30 a.m. (5 hours before procedure):         1. Every 15 minutes, drink the solution down to the next mark (approx 8 oz) until the full liter is complete.  2. Follow completed prep with 16 oz. of clear liquid of your choice.    3. You may drink clear liquids until 11:30am (2 HOURS BEFORE PROCEDURE).   MEDICATION INSTRUCTIONS  Unless otherwise instructed, you should take regular prescription medications with a small sip of water   as early as possible the morning of your procedure.  Diabetic patients - see separate instructions.        OTHER INSTRUCTIONS  You will need a responsible adult at least 75 years of age to accompany you and drive you home.   This person must remain in the waiting room during your procedure.  Wear loose fitting clothing that is easily removed.  Leave jewelry and other valuables at home.  However, you may wish to bring a book to read or  an iPod/MP3 player to listen to music as you wait for your procedure to start.  Remove all body piercing jewelry and leave at home.  Total time from sign-in until discharge is approximately 2-3 hours.  You should go home directly after your procedure and rest.  You can resume normal activities the  day after your procedure.  The day of your procedure you should not:   Drive   Make legal decisions   Operate machinery   Drink alcohol   Return to work  You will receive specific instructions about eating, activities and medications before you leave.    The above instructions have been reviewed and explained to me by   Marchelle Folks.     I  fully understand and can verbalize these instructions _____________________________ Date _________

## 2010-04-26 NOTE — Letter (Signed)
Summary: [facility]  [facility]   Imported By: Lester Alberta 12/07/2009 07:23:18  _____________________________________________________________________  External Attachment:    Type:   Image     Comment:   External Document

## 2010-04-26 NOTE — Progress Notes (Signed)
Summary: Talk to nurse   Phone Note Call from Patient Call back at Home Phone [phone]   Caller: Patient Call For: Dr. Russella Dar Reason for Call: Talk to Nurse Summary of Call: Pt had colon on Tues, take 650mg  iron daily...but concerned that she has not had a BM since her colon.  Please call her at home and advise what she should do.   Initial call taken by: Misty Stanley,  November 17, 2009 8:21 AM  Follow-up for Phone Call        Patient  hasn't had a BM since the colon on Monday.  Patient advised that she can start on Miralax 1-3 times a day until she has a BM then titrate as needed.  patient verblaized understanding she will call back for further questions or concerns. Passing gas, denies any other complaints. Follow-up by: Darcey Nora RN, CGRN,  November 17, 2009 8:45 AM  Additional Follow-up for Phone Call Additional follow up Details #1::        Agree. Additional Follow-up by: Meryl Dare MD Clementeen Graham,  November 17, 2009 9:14 AM

## 2010-04-26 NOTE — Assessment & Plan Note (Signed)
Summary: Pulmonary OV   Copy to:  Ranjan sharma Primary Provider/Referring Provider:  Dr. Adela Lank  CC:  Acute Visit.  c/o prod cough with green mucus, chest tightness, hoarseness, increased SOB with activity x 9days.  Denies wheezing, and f/c/s.  Marland Kitchen  History of Present Illness: 77  years old female who presents with asthma with reflux disease and mild sinusitis as triggers  for the patient's asthma.  The patient had received immunotherapy in the past without improvement.  She had did have positive skin testing.  However, her IgE level obtained at the last visit was only 4.5.  Sinus CT scan showed only mild sinusitis without overt infection.  She is not a Xolair candidate due to the low IgE levels.    October 04, 2009 10:39 AM Doing welll No new issues.   Pt denies any significant sore throat, nasal congestion or excess secretions, fever, chills, sweats, unintended weight loss, pleurtic or exertional chest pain, orthopnea PND, or leg swelling Pt denies any increase in rescue therapy over baseline, denies waking up needing it or having any early am or nocturnal exacerbations of coughing/wheezing/or dyspnea.   October 13, 2009 10:03 AM Micah Flesher outside in heat and did water aerobics and now worse x one week.   Symptoms : tight, green, cough, hoarse, raw in throat, nose is ok,  feels tired,  pt is more dyspneic. Nares are draining.  Sinuses have some pressure.  Pt feels very fatigued.   ? of anemia. At last ov pt was to try pulmicort and stop symbicort, she has not yet done this   Preventive Screening-Counseling & Management  Alcohol-Tobacco     Smoking Status: quit > 6 months     Year Quit: 1970     Pack years: 2 cigarettes daily x10 years  Current Medications (verified): 1)  Meloxicam 15 Mg  Tabs (Meloxicam) .... One Tablet Daily 2)  Diovan 160 Mg  Tabs (Valsartan) .... One Tablet Daily. 3)  Vitamin B-1 100 Mg  Tabs (Thiamine Hcl) .... One As Needed 4)  Multivitamins   Tabs (Multiple  Vitamin) .... One Tablet Daily. 5)  Zyrtec Allergy 10 Mg  Tabs (Cetirizine Hcl) .... By Mouth Daily 6)  Singulair 10 Mg  Tabs (Montelukast Sodium) .... One Tablet Daily. 7)  Paxil Cr 25 Mg Xr24h-Tab (Paroxetine Hcl) .Marland Kitchen.. 1 By Mouth Daily 8)  Proair Hfa 108 (90 Base) Mcg/act  Aers (Albuterol Sulfate) .... Inhale 2 Puffs Every 4-6 Hours As Needed. 9)  Skelaxin 800 Mg  Tabs (Metaxalone) .... Take 1/2 As Needed 10)  Crestor 20 Mg  Tabs (Rosuvastatin Calcium) .... One Tablet Daily. 11)  Actos 30 Mg  Tabs (Pioglitazone Hcl) .Marland Kitchen.. 1 By Mouth Daily 12)  Clonazepam 0.5 Mg  Tabs (Clonazepam) .... 1/2 By Mouth Dailly 13)  Pulmicort Flexhaler 180 Mcg/act  Inha (Budesonide (Inhalation)) .... Two  Puffs Twice Daily 14)  Fluticasone Propionate 50 Mcg/act Susp (Fluticasone Propionate) .... Two Sprays Each Nostril Daily 15)  Phenazopyridine Hcl 200 Mg Tabs (Phenazopyridine Hcl) .... One By Mouth Two Times A Day  Allergies (verified): 1)  ! * Morphine Sulfate  Past History:  Past medical, surgical, family and social histories (including risk factors) reviewed, and no changes noted (except as noted below).  Past Medical History: Reviewed history from 10/04/2009 and no changes required. Current Problems:  CONSTIPATION, CHRONIC, HX OF (ICD-V12.79) * S/P HYSTERECTOMY ANXIETY (ICD-300.00) FIBROMYALGIA (ICD-729.1) ARTHRITIS (ICD-716.90) HYPERTENSION (ICD-401.9) HYPERLIPIDEMIA (ICD-272.4) G E R D (ICD-530.81) ALLERGIC RHINITIS (ICD-477.9)  Asthma Interstitial Cystitis     -Darvin Neighbours GU  Past Surgical History: Reviewed history from 08/12/2007 and no changes required. Cataract Extraction Total Hip Arthroplasty (bilateral) Status post hysterectomy  Past Pulmonary History:  Pulmonary History:      Exam Type: CT Sinus Results: IMPRESSION: Very mild chronic sinusitis bilaterally.  Pulmonary Function Test  Date: 08/13/2007 Height (in.): 66 Gender: Female  Pre-Spirometry  FVC     Value: 2.68  L/min   Pred: 2.94 L/min     % Pred: 91 % FEV1     Value: 2.05 L     Pred: 2.20 L     % Pred: 93 % FEV1/FVC   Value: 76 %     Pred: 77 %     FEF 25-75   Value: 1.72 L/min   Pred: 1.94 L/min     % Pred: 88 %  Comments:  Normal spirometry  Evaluation:  normal  Family History: Reviewed history from 08/13/2007 and no changes required. non contrib  Social History: Reviewed history from 08/13/2007 and no changes required. Former smoker Married with two children Retired Scientist, clinical (histocompatibility and immunogenetics)  Review of Systems       The patient complains of shortness of breath with activity, shortness of breath at rest, productive cough, non-productive cough, nasal congestion/difficulty breathing through nose, and change in color of mucus.  The patient denies coughing up blood, chest pain, irregular heartbeats, acid heartburn, indigestion, loss of appetite, weight change, abdominal pain, difficulty swallowing, sore throat, tooth/dental problems, headaches, sneezing, itching, ear ache, anxiety, depression, hand/feet swelling, joint stiffness or pain, rash, and fever.    Vital Signs:  Patient profile:   77 year old female Height:      65 inches Weight:      164.13 pounds BMI:     27.41 O2 Sat:      90 % on Room air Temp:     98.1 degrees F oral Pulse rate:   80 / minute BP sitting:   140 / 60  (left arm) Cuff size:   regular  Vitals Entered By: Gweneth Dimitri RN (October 13, 2009 9:56 AM)  O2 Flow:  Room air CC: Acute Visit.  c/o prod cough with green mucus, chest tightness, hoarseness, increased SOB with activity x 9days.  Denies wheezing, f/c/s.   Comments Medications reviewed with patient Daytime contact number verified with patient. Gweneth Dimitri RN  October 13, 2009 9:57 AM    Physical Exam  Additional Exam:  Gen: Pleasant, well nourished, in no distress ENT: no lesions, clear discharge.  Neck: No JVD, no TMG, no carotid bruits Lungs:inspir/exp wheeze,   Cardiovascular: RRR, heart sounds normal, no  murmurs or gallops, no peripheral edema Musculoskeletal: No deformities, no cyanosis or clubbing   White Cell Count          4.8 K/uL                    4.5-10.5   Red Cell Count       [L]  3.32 Mil/uL                 3.87-5.11   Hemoglobin           [L]  10.5 g/dL                   16.1-09.6   Hematocrit           [L]  31.5 %  36.0-46.0   MCV                       94.7 fl                     78.0-100.0   MCHC                      33.3 g/dL                   35.0-09.3   RDW                  [H]  16.5 %                      11.5-14.6   Platelet Count            152.0 K/uL                  150.0-400.0   Neutrophil %              60.8 %                      43.0-77.0   Lymphocyte %              27.0 %                      12.0-46.0   Monocyte %                8.5 %                       3.0-12.0   Eosinophils%              3.2 %                       0.0-5.0   Basophils %               0.5 %                       0.0-3.0   Neutrophill Absolute      2.9 K/uL                    1.4-7.7   Lymphocyte Absolute       1.3 K/uL                    0.7-4.0   Monocyte Absolute         0.4 K/uL                    0.1-1.0  Eosinophils, Absolute                             0.2 K/uL                    0.0-0.7   Basophils Absolute        0.0 K/uL                    0.0-0.1  Impression & Recommendations:  Problem # 1:  ACUTE BRONCHITIS (ICD-466.0) Assessment Deteriorated acute tracheobronchitis with flare plan augmenin x 7days pulse prednisone cont inhaled meds, do not change to pulmicort,  maintain symbicort for now Her updated medication list for this problem includes:    Singulair 10 Mg Tabs (Montelukast sodium) ..... One tablet daily.    Proair Hfa 108 (90 Base) Mcg/act Aers (Albuterol sulfate) ..... Inhale 2 puffs every 4-6 hours as needed.    Symbicort 160-4.5 Mcg/act Aero (Budesonide-formoterol fumarate) .Marland Kitchen..Marland Kitchen Two puffs twice daily cancel pulmicort rx    Amoxicillin-pot  Clavulanate 875-125 Mg Tabs (Amoxicillin-pot clavulanate) ..... One by mouth two times a day  Orders: Est. Patient Level IV (16109) Prescription Created Electronically [phone] [id number]) TLB-CBC Platelet - w/Differential (85025-CBCD)  Problem # 2:  G E R D (ICD-530.81) Assessment: Unchanged no true gerd hx but pt fatigued and has hx of gastritis in past plan note CBC reveals normocytic normochromic anemia ? cause?   f/u with pcp dr Juleen China.  Medications Added to Medication List This Visit: 1)  Symbicort 160-4.5 Mcg/act Aero (Budesonide-formoterol fumarate) .... Two puffs twice daily cancel pulmicort rx 2)  Prednisone 10 Mg Tabs (Prednisone) .... Take as directed 4 each am x3days, 3 x 3days, 2 x 3days, 1 x 3days then stop 3)  Amoxicillin-pot Clavulanate 875-125 Mg Tabs (Amoxicillin-pot clavulanate) .... One by mouth two times a day  Complete Medication List: 1)  Meloxicam 15 Mg Tabs (Meloxicam) .... One tablet daily 2)  Diovan 160 Mg Tabs (Valsartan) .... One tablet daily. 3)  Vitamin B-1 100 Mg Tabs (Thiamine hcl) .... One as needed 4)  Multivitamins Tabs (Multiple vitamin) .... One tablet daily. 5)  Zyrtec Allergy 10 Mg Tabs (Cetirizine hcl) .... By mouth daily 6)  Singulair 10 Mg Tabs (Montelukast sodium) .... One tablet daily. 7)  Paxil Cr 25 Mg Xr24h-tab (Paroxetine hcl) .Marland Kitchen.. 1 by mouth daily 8)  Proair Hfa 108 (90 Base) Mcg/act Aers (Albuterol sulfate) .... Inhale 2 puffs every 4-6 hours as needed. 9)  Skelaxin 800 Mg Tabs (Metaxalone) .... Take 1/2 as needed 10)  Crestor 20 Mg Tabs (Rosuvastatin calcium) .... One tablet daily. 11)  Actos 30 Mg Tabs (Pioglitazone hcl) .Marland Kitchen.. 1 by mouth daily 12)  Clonazepam 0.5 Mg Tabs (Clonazepam) .... 1/2 by mouth dailly 13)  Symbicort 160-4.5 Mcg/act Aero (Budesonide-formoterol fumarate) .... Two puffs twice daily cancel pulmicort rx 14)  Fluticasone Propionate 50 Mcg/act Susp (Fluticasone propionate) .... Two sprays each nostril daily 15)  Phenazopyridine  Hcl 200 Mg Tabs (Phenazopyridine hcl) .... One by mouth two times a day 16)  Prednisone 10 Mg Tabs (Prednisone) .... Take as directed 4 each am x3days, 3 x 3days, 2 x 3days, 1 x 3days then stop 17)  Amoxicillin-pot Clavulanate 875-125 Mg Tabs (Amoxicillin-pot clavulanate) .... One by mouth two times a day  Patient Instructions: 1)  Augmentin one two times a day x 7days 2)  Prednisone 10mg  4 each am x3days, 3 x 3days, 2 x 3days, 1 x 3days then stop 3)  Do not start pulmicort 4)  STay on symbicort two puff two times a day  5)  Labs today 6)  Return 3 weeks for recheck Prescriptions: AMOXICILLIN-POT CLAVULANATE 875-125 MG TABS (AMOXICILLIN-POT CLAVULANATE) one by mouth two times a day  #14 x 0   Entered and Authorized by:   Storm Frisk MD   Signed by:   Storm Frisk MD on 10/13/2009   Method used:   Electronically to        [facility]* (retail)       [address]       [place]  [address] [place], [place]  [address]       Ph: [phone] or [phone]       Fax: [phone]   RxID:   [id number] PREDNISONE 10 MG  TABS (PREDNISONE) Take as directed 4 each am x3days, 3 x 3days, 2 x 3days, 1 x 3days then stop  #30 x 0   Entered and Authorized by:   Storm Frisk MD   Signed by:   Storm Frisk MD on 10/13/2009   Method used:   Electronically to        [facility]* (retail)       [address] [place], [place]  [address]       Ph: [phone] or [phone]       Fax: [phone]   RxID:   [id number] SYMBICORT 160-4.5 MCG/ACT  AERO (BUDESONIDE-FORMOTEROL FUMARATE) Two puffs twice daily cancel pulmicort Rx  #1 x 6   Entered and Authorized by:   Storm Frisk MD   Signed by:   Storm Frisk MD on 10/13/2009   Method used:   Electronically to        [facility]* (retail)       [address] [place], [place]  [address]       Ph: [phone] or [phone]       Fax: [phone]   RxID:   [id number]   Appended Document: Pulmonary OV fax dennis kohut

## 2010-04-26 NOTE — Procedures (Signed)
Summary: EGD   EGD  Procedure date:  08/10/2003  Findings:      Findings: Gastritis  Location: [facility]   Patient Name: [name], [name]. MRN:  Procedure Procedures: Panendoscopy (EGD) CPT: 43235.    with biopsy(s)/brushing(s). CPT: D1846139.  Personnel: Endoscopist: Venita Lick. Russella Dar, MD, Clementeen Graham.  Exam Location: Exam performed in Outpatient Clinic. Outpatient  Patient Consent: Procedure, Alternatives, Risks and Benefits discussed, consent obtained, from patient. Consent was obtained by the RN.  Indications Symptoms: Dysphagia. Chest Pain. Nausea. Vomiting.  History  Current Medications: Patient is not currently taking Coumadin.  Pre-Exam Physical: Performed Aug 10, 2003  Entire physical exam was normal.  Exam Exam Info: Maximum depth of insertion Duodenum, intended Duodenum. Vocal cords not visualized. Gastric retroflexion performed. ASA Classification: II. Tolerance: excellent.  Sedation Meds: Patient assessed and found to be appropriate for moderate (conscious) sedation. Fentanyl 50 mcg. given IV. Versed 5 mg. given IV. Cetacaine Spray 2 sprays given aerosolized.  Monitoring: BP and pulse monitoring done. Oximetry used. Supplemental O2 given  Findings Normal: Proximal Esophagus to Body.  Normal: Duodenal Bulb to Duodenal 2nd Portion.  MUCOSAL ABNORMALITY: Antrum to Pyloric Sphincter. Erythematous mucosa. RUT done, results pending. ICD9: Gastritis, Unspecified: 535.50.   Assessment  Diagnoses: 535.50: Gastritis, Unspecified.   Events  Unplanned Intervention: No unplanned interventions were required.  Unplanned Events: There were no complications. Plans Medication(s): Await pathology. Continue current medications. PPI: Pantoprazole/Protonix 40 mg QAM,   Patient Education: Patient given standard instructions for: Reflux. Mucosal Abnormality.  Disposition: After procedure patient sent to recovery. After recovery patient sent home.    Scheduling: Office Visit, to [employer]. Russella Dar, MD, Clementeen Graham, prn  Primary Care Provider, to Adela Lank, MD,    This report was created from the original endoscopy report, which was reviewed and signed by the above listed endoscopist.    cc: Adela Lank, MD   RUT Negative

## 2010-04-26 NOTE — Assessment & Plan Note (Signed)
Summary: NP follow up - bronchitis   Copy to:  Ranjan sharma Primary Provider/Referring Provider:  Adela Lank, MD  CC:  still having tightness in chest, dry cough, chest congestion - states does feel better than last ov, and finished zpak and pred taper.  was given cortisone injection 4-5days ago by Dr. Teressa Senter which she states helped.Marland Kitchen  History of Present Illness: 77  years old female who presents with asthma with reflux disease and mild sinusitis as triggers  for the patient's asthma.  The patient had received immunotherapy in the past without improvement.  She had did have positive skin testing.  However, her IgE level obtained at the last visit was only 4.5.  Sinus CT scan showed only mild sinusitis without overt infection.  She is not a Xolair candidate due to the low IgE levels.   October 13, 2009 10:03 AM Micah Flesher outside in heat and did water aerobics and now worse x one week.   Symptoms : tight, green, cough, hoarse, raw in throat, nose is ok,  feels tired,  pt is more dyspneic. Nares are draining.  Sinuses have some pressure.  Pt feels very fatigued.   ? of anemia. At last ov pt was to try pulmicort and stop symbicort, she has not yet done this  November 03, 2009 2:20 PM Pt took pred and abx for sinusitis.  Now is better but is having reflux.  Has Fe and this causes burping and belching.  The patient notes now is less dyspneic. There is no cough or wheezing.  There is no chest pain.     March 05, 2010 -Presents for an acute office visit. Complains of prod cough with small amount of yellow mucus, wheezing, hoarseness, some increased SOB x3-4 days. She is under enormous stress. 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 "do not have time to be sick". Also has cold sore for last 1 week on top lips. Denies chest pain, , orthopnea, hemoptysis, fever, n/v/d, edema, headache,recent travel or antibiotics.   March 13, 2010--Presents for follow up. Tx last week for bronchitis w/ zpack and  prednisone taper. Returns she is improved however not completely well yet. Complains she is still having tightness in chest, dry cough, chest congestion - states does feel better than last ov. No discolored mucus or fever. just not back to baseline. Husband going for Bypass surgery 03/30/10 , needs to be well. Denies chest pain, orthopnea, hemoptysis, fever, n/v/d, headache.   Medications Prior to Update: 1)  Meloxicam 15 Mg  Tabs (Meloxicam) .... One Tablet Daily Hold For Now 2)  Diovan 160 Mg  Tabs (Valsartan) .... One Tablet Daily. 3)  Multivitamins   Tabs (Multiple Vitamin) .... One Tablet Daily. 4)  Zyrtec Allergy 10 Mg  Tabs (Cetirizine Hcl) .... By Mouth Daily 5)  Singulair 10 Mg  Tabs (Montelukast Sodium) .... One Tablet Daily. 6)  Paxil Cr 25 Mg Xr24h-Tab (Paroxetine Hcl) .Marland Kitchen.. 1 By Mouth Daily 7)  Proair Hfa 108 (90 Base) Mcg/act  Aers (Albuterol Sulfate) .... Inhale 2 Puffs Every 4-6 Hours As Needed. 8)  Skelaxin 800 Mg  Tabs (Metaxalone) .... Take 1/2 As Needed 9)  Actos 30 Mg  Tabs (Pioglitazone Hcl) .Marland Kitchen.. 1 By Mouth Daily 10)  Clonazepam 0.5 Mg  Tabs (Clonazepam) .... 1/2 By Mouth Dailly 11)  Symbicort 160-4.5 Mcg/act  Aero (Budesonide-Formoterol Fumarate) .... Two Puffs Twice Daily Cancel Pulmicort Rx 12)  Fluticasone Propionate 50 Mcg/act Susp (Fluticasone Propionate) .... Two Sprays Each Nostril Daily  13)  Super Calcium/d 600-125 Mg-Unit Tabs (Calcium-Vitamin D) .... Take Two By Mouth Once Daily 14)  Omeprazole 20 Mg Cpdr (Omeprazole) .... One Tablet By Mouth Once Daily 15)  Iron 325 (65 Fe) Mg Tabs (Ferrous Sulfate) .Marland Kitchen.. 1 By Mouth Two Times A Day 16)  Tylenol Arthritis Pain 650 Mg Cr-Tabs (Acetaminophen) .... Take 2 Tablets By Mouth Two Times A Day 17)  Zithromax Z-Pak 250 Mg Tabs (Azithromycin) .... Take As Directed 18)  Valtrex 1 Gm Tabs (Valacyclovir Hcl) .... 2 By Mouth Two Times A Day X 1 Day 19)  Hydromet 5-1.5 Mg/52ml Syrp (Hydrocodone-Homatropine) .Marland Kitchen.. 1-2 Tsp Every 4-6 Hrs  As Needed Cough , May Make You Sleepy 20)  Prednisone 10 Mg Tabs (Prednisone) .... 4 Tabs For 2 Days, Then 3 Tabs For 2 Days, 2 Tabs For 2 Days, Then 1 Tab For 2 Days, Then Stop  Current Medications (verified): 1)  Meloxicam 15 Mg  Tabs (Meloxicam) .... One Tablet Daily Hold For Now 2)  Diovan 160 Mg  Tabs (Valsartan) .... One Tablet Daily. 3)  Multivitamins   Tabs (Multiple Vitamin) .... One Tablet Daily. 4)  Zyrtec Allergy 10 Mg  Tabs (Cetirizine Hcl) .... By Mouth Daily 5)  Singulair 10 Mg  Tabs (Montelukast Sodium) .... One Tablet Daily. 6)  Paxil Cr 25 Mg Xr24h-Tab (Paroxetine Hcl) .Marland Kitchen.. 1 By Mouth Daily 7)  Proair Hfa 108 (90 Base) Mcg/act  Aers (Albuterol Sulfate) .... Inhale 2 Puffs Every 4-6 Hours As Needed. 8)  Skelaxin 800 Mg  Tabs (Metaxalone) .... Take 1/2 As Needed 9)  Actos 30 Mg  Tabs (Pioglitazone Hcl) .Marland Kitchen.. 1 By Mouth Daily 10)  Clonazepam 0.5 Mg  Tabs (Clonazepam) .... 1/2 By Mouth Dailly 11)  Symbicort 160-4.5 Mcg/act  Aero (Budesonide-Formoterol Fumarate) .... Two Puffs Twice Daily Cancel Pulmicort Rx 12)  Fluticasone Propionate 50 Mcg/act Susp (Fluticasone Propionate) .... Two Sprays Each Nostril Daily 13)  Super Calcium/d 600-125 Mg-Unit Tabs (Calcium-Vitamin D) .... Take Two By Mouth Once Daily 14)  Omeprazole 20 Mg Cpdr (Omeprazole) .... One Tablet By Mouth Once Daily 15)  Iron 325 (65 Fe) Mg Tabs (Ferrous Sulfate) .Marland Kitchen.. 1 By Mouth Two Times A Day 16)  Tylenol Arthritis Pain 650 Mg Cr-Tabs (Acetaminophen) .... Take 2 Tablets By Mouth Two Times A Day 17)  Hydromet 5-1.5 Mg/73ml Syrp (Hydrocodone-Homatropine) .Marland Kitchen.. 1-2 Tsp Every 4-6 Hrs As Needed Cough , May Make You Sleepy  Allergies (verified): 1)  ! * Morphine Sulfate  Past History:  Past Medical History: Last updated: 10/23/2009 CONSTIPATION, CHRONIC, HX OF (ICD-V12.79) ANXIETY (ICD-300.00) FIBROMYALGIA (ICD-729.1) ARTHRITIS (ICD-716.90) HYPERTENSION (ICD-401.9) HYPERLIPIDEMIA (ICD-272.4) GERD  (ICD-530.81) ALLERGIC RHINITIS (ICD-477.9) Asthma Hemorrhoids, internal and external Interstitial Cystitis     -Darvin Neighbours GU  Past Surgical History: Last updated: 10/23/2009 Cataract Extraction bilateral Total Hip Arthroplasty (bilateral) Hysterectomy Ovarian cyst removed Lumbar disc surgery  Family History: Last updated: 10/23/2009 non contrib No FH of Colon Cancer: Family History of Heart Disease: father  Social History: Last updated: 11/13/2009 Former smoker Married with two children Retired Med [employer] Alcohol Use - no Daily Caffeine Use coffee in AM Illicit Drug Use - no  Risk Factors: Smoking Status: quit > 6 months (11/03/2009)  Past Pulmonary History:  Pulmonary History:      Exam Type: CT Sinus Results: IMPRESSION: Very mild chronic sinusitis bilaterally.  Pulmonary Function Test  Date: 08/13/2007 Height (in.): 66 Gender: Female  Pre-Spirometry  FVC     Value: 2.68 L/min   Pred:  2.94 L/min     % Pred: 91 % FEV1     Value: 2.05 L     Pred: 2.20 L     % Pred: 93 % FEV1/FVC   Value: 76 %     Pred: 77 %     FEF 25-75   Value: 1.72 L/min   Pred: 1.94 L/min     % Pred: 88 %  Comments:  Normal spirometry  Evaluation:  normal  Review of Systems      See HPI  Vital Signs:  Patient profile:   77 year old female Height:      65 inches Weight:      161.50 pounds BMI:     26.97 O2 Sat:      99 % on Room air Temp:     97.6 degrees F oral Pulse rate:   80 / minute BP sitting:   130 / 82  (left arm) Cuff size:   regular  Vitals Entered By: Boone Master CNA/MA (March 13, 2010 2:15 PM)  O2 Flow:  Room air CC: still having tightness in chest, dry cough, chest congestion - states does feel better than last ov, finished zpak and pred taper.  was given cortisone injection 4-5days ago by Dr. Teressa Senter which she states helped. Is Patient Diabetic? No Comments Medications reviewed with patient Daytime contact number verified with patient. Boone Master  CNA/MA  March 13, 2010 2:14 PM    Physical Exam  Additional Exam:  Gen: Pleasant, well nourished, in no distress ENT: no lesions, clear discharge, post pharynx clear  Neck: No JVD, no TMG, no carotid bruits Lungs coarse BS w/ no wheezing  Cardiovascular: RRR, heart sounds normal, no murmurs or gallops, no peripheral edema Musculoskeletal: No deformities, no cyanosis or clubbing     Impression & Recommendations:  Problem # 1:  ACUTE BRONCHITIS (ICD-466.0)  Slow to resolve flare.  No further abx or steroids at this time.  xopenex neb in office  Plan:  Mucinex DM two times a day as needed cough/congestion  Hydromet 1-2 tsp every 4-6 hrs as needed cough/congestion  Increase fluids and rest .  Please contact office for sooner follow up if symptoms do not improve or worsen  The following medications were removed from the medication list:    Zithromax Z-pak 250 Mg Tabs (Azithromycin) .Marland Kitchen... Take as directed Her updated medication list for this problem includes:    Singulair 10 Mg Tabs (Montelukast sodium) ..... One tablet daily.    Proair Hfa 108 (90 Base) Mcg/act Aers (Albuterol sulfate) ..... Inhale 2 puffs every 4-6 hours as needed.    Symbicort 160-4.5 Mcg/act Aero (Budesonide-formoterol fumarate) .Marland Kitchen..Marland Kitchen Two puffs twice daily cancel pulmicort rx    Hydromet 5-1.5 Mg/35ml Syrp (Hydrocodone-homatropine) .Marland Kitchen... 1-2 tsp every 4-6 hrs as needed cough , may make you sleepy  Orders: Est. Patient Level III (11914)  Patient Instructions: 1)  Mucinex DM two times a day as needed cough/congestion  2)  Hydromet 1-2 tsp every 4-6 hrs as needed cough/congestion  3)  Increase fluids and rest .  4)  Please contact office for sooner follow up if symptoms do not improve or worsen

## 2010-04-26 NOTE — Assessment & Plan Note (Signed)
Summary: Pulmonary OV   Copy to:  Ranjan sharma Primary Provider/Referring Provider:  Adela Lank, MD  CC:  1 month follow up.  Pt states she does have some chest tightness, sinus congestion, drainage, and prod cough with light yellow mucus when using netti pot x 1 month.  Denies SOB.Marland Kitchen  History of Present Illness: 77  years old female who presents with asthma with reflux disease and mild sinusitis as triggers  for the patient's asthma.  The patient had received immunotherapy in the past without improvement.  She had did have positive skin testing.  However, her IgE level obtained at the last visit was only 4.5.  Sinus CT scan showed only mild sinusitis without overt infection.  She is not a Xolair candidate due to the low IgE levels.   October 13, 2009 10:03 AM Micah Flesher outside in heat and did water aerobics and now worse x one week.   Symptoms : tight, green, cough, hoarse, raw in throat, nose is ok,  feels tired,  pt is more dyspneic. Nares are draining.  Sinuses have some pressure.  Pt feels very fatigued.   ? of anemia. At last ov pt was to try pulmicort and stop symbicort, she has not yet done this  November 03, 2009 2:20 PM Pt took pred and abx for sinusitis.  Now is better but is having reflux.  Has Fe and this causes burping and belching.  The patient notes now is less dyspneic. There is no cough or wheezing.  There is no chest pain.     March 05, 2010 -Presents for an acute office visit. Complains of prod cough with small amount of yellow mucus, wheezing, hoarseness, some increased SOB x3-4 days. She is under enormous stress. 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 "do not have time to be sick". Also has cold sore for last 1 week on top lips. Denies chest pain, , orthopnea, hemoptysis, fever, n/v/d, edema, headache,recent travel or antibiotics.   March 13, 2010--Presents for follow up. Tx last week for bronchitis w/ zpack and prednisone taper. Returns she is improved however not  completely well yet. Complains she is still having tightness in chest, dry cough, chest congestion - states does feel better than last ov. No discolored mucus or fever. just not back to baseline. Husband going for Bypass surgery 03/30/10 , needs to be well. Denies chest pain, orthopnea, hemoptysis, fever, n/v/d, headache. April 16, 2010 9:44 AM Had two OVs for bronchitis and sinusitis in 12/11.  Uses nedi pot.  Not coughing now.  Now no wheeze.  No real chest pain.  Has occ chest tightness.   Pt overall now is better.  No other new issues.  Current Medications (verified): 1)  Meloxicam 15 Mg  Tabs (Meloxicam) .... One Tablet Daily Hold For Now 2)  Diovan 160 Mg  Tabs (Valsartan) .... One Tablet Daily. 3)  Multivitamins   Tabs (Multiple Vitamin) .... One Tablet Daily. 4)  Zyrtec Allergy 10 Mg  Tabs (Cetirizine Hcl) .... By Mouth Daily 5)  Singulair 10 Mg  Tabs (Montelukast Sodium) .... One Tablet Daily. 6)  Paxil Cr 25 Mg Xr24h-Tab (Paroxetine Hcl) .Marland Kitchen.. 1 By Mouth Daily 7)  Proair Hfa 108 (90 Base) Mcg/act  Aers (Albuterol Sulfate) .... Inhale 2 Puffs Every 4-6 Hours As Needed. 8)  Skelaxin 800 Mg  Tabs (Metaxalone) .... Take 1/2 As Needed 9)  Actos 30 Mg  Tabs (Pioglitazone Hcl) .Marland Kitchen.. 1 By Mouth Daily 10)  Clonazepam 0.5 Mg  Tabs (Clonazepam) .... 1/2 By Mouth Dailly 11)  Symbicort 160-4.5 Mcg/act  Aero (Budesonide-Formoterol Fumarate) .... Two Puffs Twice Daily Cancel Pulmicort Rx 12)  Fluticasone Propionate 50 Mcg/act Susp (Fluticasone Propionate) .... Two Sprays Each Nostril Daily 13)  Super Calcium/d 600-125 Mg-Unit Tabs (Calcium-Vitamin D) .... Take Two By Mouth Once Daily 14)  Omeprazole 20 Mg Cpdr (Omeprazole) .... One Tablet By Mouth Once Daily 15)  Iron 325 (65 Fe) Mg Tabs (Ferrous Sulfate) .Marland Kitchen.. 1 By Mouth Two Times A Day 16)  Tylenol Arthritis Pain 650 Mg Cr-Tabs (Acetaminophen) .... Take 2 Tablets By Mouth Two Times A Day As Needed  Allergies (verified): 1)  ! * Morphine  Sulfate  Past History:  Past medical, surgical, family and social histories (including risk factors) reviewed, and no changes noted (except as noted below).  Past Medical History: Reviewed history from 10/23/2009 and no changes required. CONSTIPATION, CHRONIC, HX OF (ICD-V12.79) ANXIETY (ICD-300.00) FIBROMYALGIA (ICD-729.1) ARTHRITIS (ICD-716.90) HYPERTENSION (ICD-401.9) HYPERLIPIDEMIA (ICD-272.4) GERD (ICD-530.81) ALLERGIC RHINITIS (ICD-477.9) Asthma Hemorrhoids, internal and external Interstitial Cystitis     -[facility]  Past Surgical History: Reviewed history from 10/23/2009 and no changes required. Cataract Extraction bilateral Total Hip Arthroplasty (bilateral) Hysterectomy Ovarian cyst removed Lumbar disc surgery  Past Pulmonary History:  Pulmonary History:      Exam Type: CT Sinus Results: IMPRESSION: Very mild chronic sinusitis bilaterally.  Pulmonary Function Test  Date: 08/13/2007 Height (in.): 66 Gender: Female  Pre-Spirometry  FVC     Value: 2.68 L/min   Pred: 2.94 L/min     % Pred: 91 % FEV1     Value: 2.05 L     Pred: 2.20 L     % Pred: 93 % FEV1/FVC   Value: 76 %     Pred: 77 %     FEF 25-75   Value: 1.72 L/min   Pred: 1.94 L/min     % Pred: 88 %  Comments:  Normal spirometry  Evaluation:  normal  Family History: Reviewed history from 10/23/2009 and no changes required. non contrib No FH of Colon Cancer: Family History of Heart Disease: father  Social History: Reviewed history from 11/13/2009 and no changes required. Former smoker Married with two children Retired Scientist, clinical (histocompatibility and immunogenetics) Alcohol Use - no Daily Caffeine Use coffee in AM Illicit Drug Use - no  Review of Systems       The patient complains of shortness of breath with activity.  The patient denies shortness of breath at rest, productive cough, non-productive cough, coughing up blood, chest pain, irregular heartbeats, acid heartburn, indigestion, loss of appetite, weight change,  abdominal pain, difficulty swallowing, sore throat, tooth/dental problems, headaches, nasal congestion/difficulty breathing through nose, sneezing, itching, ear ache, anxiety, depression, hand/feet swelling, joint stiffness or pain, rash, change in color of mucus, and fever.    Vital Signs:  Patient profile:   77 year old female Height:      65 inches Weight:      164.25 pounds BMI:     27.43 O2 Sat:      99 % on Room air Temp:     98.3 degrees F oral Pulse rate:   83 / minute BP sitting:   140 / 88  (right arm) Cuff size:   regular  Vitals Entered By: Gweneth Dimitri RN (April 16, 2010 9:38 AM)  O2 Flow:  Room air CC: 1 month follow up.  Pt states she does have some chest tightness, sinus congestion, drainage, prod cough with light  yellow mucus when using netti pot x 1 month.  Denies SOB. Comments Medications reviewed with patient Daytime contact number verified with patient. Gweneth Dimitri RN  April 16, 2010 9:38 AM    Physical Exam  Additional Exam:  Gen: Pleasant, well nourished, in no distress ENT: no lesions, clear discharge, post pharynx clear  Neck: No JVD, no TMG, no carotid bruits Lungs coarse BS w/ no wheezing  Cardiovascular: RRR, heart sounds normal, no murmurs or gallops, no peripheral edema Musculoskeletal: No deformities, no cyanosis or clubbing     Impression & Recommendations:  Problem # 1:  ASTHMA, EXTRINSIC (ICD-493.00) Assessment Unchanged  Moderate persistent asthma improved plan stay on Laba/ics for now  Medications Added to Medication List This Visit: 1)  Tylenol Arthritis Pain 650 Mg Cr-tabs (Acetaminophen) .... Take 2 tablets by mouth two times a day as needed  Complete Medication List: 1)  Meloxicam 15 Mg Tabs (Meloxicam) .... One tablet daily hold for now 2)  Diovan 160 Mg Tabs (Valsartan) .... One tablet daily. 3)  Multivitamins Tabs (Multiple vitamin) .... One tablet daily. 4)  Zyrtec Allergy 10 Mg Tabs (Cetirizine hcl) .... By mouth  daily 5)  Singulair 10 Mg Tabs (Montelukast sodium) .... One tablet daily. 6)  Paxil Cr 25 Mg Xr24h-tab (Paroxetine hcl) .Marland Kitchen.. 1 by mouth daily 7)  Proair Hfa 108 (90 Base) Mcg/act Aers (Albuterol sulfate) .... Inhale 2 puffs every 4-6 hours as needed. 8)  Skelaxin 800 Mg Tabs (Metaxalone) .... Take 1/2 as needed 9)  Actos 30 Mg Tabs (Pioglitazone hcl) .Marland Kitchen.. 1 by mouth daily 10)  Clonazepam 0.5 Mg Tabs (Clonazepam) .... 1/2 by mouth dailly 11)  Symbicort 160-4.5 Mcg/act Aero (Budesonide-formoterol fumarate) .... Two puffs twice daily cancel pulmicort rx 12)  Fluticasone Propionate 50 Mcg/act Susp (Fluticasone propionate) .... Two sprays each nostril daily 13)  Super Calcium/d 600-125 Mg-unit Tabs (Calcium-vitamin d) .... Take two by mouth once daily 14)  Omeprazole 20 Mg Cpdr (Omeprazole) .... One tablet by mouth once daily 15)  Iron 325 (65 Fe) Mg Tabs (Ferrous sulfate) .Marland Kitchen.. 1 by mouth two times a day 16)  Tylenol Arthritis Pain 650 Mg Cr-tabs (Acetaminophen) .... Take 2 tablets by mouth two times a day as needed  Other Orders: Est. Patient Level III (57846)  Patient Instructions: 1)  No change in medications 2)  Return in    4      months     Prevention & Chronic Care Immunizations   Influenza vaccine: Historical  (12/23/2009)    Tetanus booster: Not documented    Pneumococcal vaccine: Historical  (12/24/2007)    H. zoster vaccine: Not documented  Colorectal Screening   Hemoccult: Not documented    Colonoscopy: DONE  (11/14/2009)   Colonoscopy due: 08/2009  Other Screening   Pap smear: Not documented    Mammogram: Not documented    DXA bone density scan: Not documented   Smoking status: quit > 6 months  (11/03/2009)  Lipids   Total Cholesterol: Not documented   LDL: Not documented   LDL Direct: Not documented   HDL: Not documented   Triglycerides: Not documented    SGOT (AST): Not documented   SGPT (ALT): Not documented   Alkaline phosphatase: Not  documented   Total bilirubin: Not documented  Hypertension   Last Blood Pressure: 140 / 88  (04/16/2010)   Serum creatinine: Not documented   Serum potassium Not documented  Self-Management Support :    Hypertension self-management support: Not documented  Lipid self-management support: Not documented

## 2010-04-26 NOTE — Progress Notes (Signed)
Summary: proair / pharmacy calling  Phone Note From Pharmacy   Caller: Patient Call For: Candies Palm Summary of Call: [facility] requests a new rx for pt wanting refill of proair hfa. [phone] Caller: [facility] Call For: Aidin Doane  Summary of Call: pharmacy says pt is requesting refill of proair hfa. this rx has expired. [phone] Initial call taken by: Tivis Ringer, CNA,  June 13, 2009 9:34 AM    Prescriptions: PROAIR HFA 108 (90 BASE) MCG/ACT  AERS (ALBUTEROL SULFATE) Inhale 2 puffs every 4-6 hours as needed.  #1 x 5   Entered by:   Carron Curie CMA   Authorized by:   Storm Frisk MD   Signed by:   Carron Curie CMA on 06/13/2009   Method used:   Electronically to        [facility]* (retail)       [address] [place], [place]  [address]       Ph: [phone] or [phone]       Fax: [phone]   RxID:   [id number]

## 2010-04-26 NOTE — Letter (Signed)
Summary: List of current problems/Patient  List of current problems/Patient   Imported By: Sherian Rein 10/26/2009 09:13:09  _____________________________________________________________________  External Attachment:    Type:   Image     Comment:   External Document

## 2010-04-26 NOTE — Assessment & Plan Note (Signed)
Summary: Pulmonary OV   Copy to:  Ranjan sharma Primary Provider/Referring Provider:  Dr. Adela Lank  CC:  3 month follow up.  Pt states breathing doing well overall.  Denies wheezing and chest tightness and cough.  Denies problems with sinuses.  No complaints.  .  History of Present Illness: 77  years old female who presents with asthma with reflux disease and mild sinusitis as triggers  for the patient's asthma.  The patient had received immunotherapy in the past without improvement.  She had did have positive skin testing.  However, her IgE level obtained at the last visit was only 4.5.  Sinus CT scan showed only mild sinusitis without overt infection.  She is not a Xolair candidate due to the low IgE levels.     March 01, 2009 9:22 AM at last ov rx for sinusitis with avelox and depomedrol/neil med system Doing better, and still using nasal wash.  Overall much improved. Pt denies any significant sore throat, nasal congestion or excess secretions, fever, chills, sweats, unintended weight loss, pleurtic or exertional chest pain, orthopnea PND, or leg swelling Pt denies any increase in rescue therapy over baseline, denies waking up needing it or having any early am or nocturnal exacerbations of coughing/wheezing/or dyspnea.   June 21, 2009 4:38 PM Since dec 10 is ok.  No real cough . Pt denies any significant sore throat, nasal congestion or excess secretions, fever, chills, sweats, unintended weight loss, pleurtic or exertional chest pain, orthopnea PND, or leg swelling Pt denies any increase in rescue therapy over baseline, denies waking up needing it or having any early am or nocturnal exacerbations of coughing/wheezing/or dyspnea.   Asthma History    Asthma Control Assessment:    Age range: 12+ years    Symptoms: 0-2 days/week    Nighttime Awakenings: 0-2/month    Interferes w/ normal activity: no limitations    SABA use (not for EIB): 0-2 days/week    ATAQ questionnaire:  0    FEV1: 2.05 liters (today)    FEV1 Pred: 2.20 liters (today)    Exacerbations requiring oral systemic steroids: 0-1/year    Asthma Control Assessment: Well Controlled   Preventive Screening-Counseling & Management  Alcohol-Tobacco     Smoking Status: quit > 6 months  Current Medications (verified): 1)  Meloxicam 15 Mg  Tabs (Meloxicam) .... One Tablet Daily 2)  Diovan 160 Mg  Tabs (Valsartan) .... One Tablet Daily. 3)  Vitamin B-1 100 Mg  Tabs (Thiamine Hcl) .... One As Needed 4)  Multivitamins   Tabs (Multiple Vitamin) .... One Tablet Daily. 5)  Zyrtec Allergy 10 Mg  Tabs (Cetirizine Hcl) .... By Mouth Daily 6)  Singulair 10 Mg  Tabs (Montelukast Sodium) .... One Tablet Daily. 7)  Paxil Cr 25 Mg Xr24h-Tab (Paroxetine Hcl) .Marland Kitchen.. 1 By Mouth Daily 8)  Proair Hfa 108 (90 Base) Mcg/act  Aers (Albuterol Sulfate) .... Inhale 2 Puffs Every 4-6 Hours As Needed. 9)  Skelaxin 800 Mg  Tabs (Metaxalone) .... Take 1/2 As Needed 10)  Crestor 20 Mg  Tabs (Rosuvastatin Calcium) .... One Tablet Daily. 11)  Actos 30 Mg  Tabs (Pioglitazone Hcl) .Marland Kitchen.. 1 By Mouth Daily 12)  Clonazepam 0.5 Mg  Tabs (Clonazepam) .... 1/2 By Mouth Dailly 13)  Symbicort 160-4.5 Mcg/act Aero (Budesonide-Formoterol Fumarate) .... 2 Puffs 2 Times A Day 14)  Flonase 50 Mcg/act Susp (Fluticasone Propionate) .... 2 Sprays Each Nostril Once Daily  Allergies (verified): 1)  ! * Morphine Sulfate  Past History:  Past medical, surgical, family and social histories (including risk factors) reviewed, and no changes noted (except as noted below).  Past Medical History: Current Problems:  CONSTIPATION, CHRONIC, HX OF (ICD-V12.79) * S/P HYSTERECTOMY ANXIETY (ICD-300.00) FIBROMYALGIA (ICD-729.1) ARTHRITIS (ICD-716.90) HYPERTENSION (ICD-401.9) HYPERLIPIDEMIA (ICD-272.4) G E R D (ICD-530.81) ALLERGIC RHINITIS (ICD-477.9) Asthma  Past Surgical History: Reviewed history from 08/12/2007 and no changes required. Cataract  Extraction Total Hip Arthroplasty (bilateral) Status post hysterectomy  Past Pulmonary History:  Pulmonary History:      Exam Type: CT Sinus Results: IMPRESSION: Very mild chronic sinusitis bilaterally.  Pulmonary Function Test  Date: 08/13/2007 Height (in.): 66 Gender: Female  Pre-Spirometry  FVC     Value: 2.68 L/min   Pred: 2.94 L/min     % Pred: 91 % FEV1     Value: 2.05 L     Pred: 2.20 L     % Pred: 93 % FEV1/FVC   Value: 76 %     Pred: 77 %     FEF 25-75   Value: 1.72 L/min   Pred: 1.94 L/min     % Pred: 88 %  Comments:  Normal spirometry  Evaluation:  normal  Family History: Reviewed history from 08/13/2007 and no changes required. non contrib  Social History: Reviewed history from 08/13/2007 and no changes required. 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  The patient denies shortness of breath with activity, shortness of breath at rest, productive cough, non-productive cough, coughing up blood, chest pain, irregular heartbeats, acid heartburn, loss of appetite, weight change, abdominal pain, difficulty swallowing, sore throat, tooth/dental problems, headaches, nasal congestion/difficulty breathing through nose, sneezing, itching, ear ache, anxiety, depression, hand/feet swelling, joint stiffness or pain, rash, change in color of mucus, and fever.    Vital Signs:  Patient profile:   77 year old female Height:      65 inches Weight:      164.13 pounds BMI:     27.41 O2 Sat:      93 % on Room air Temp:     97.6 degrees F oral Pulse rate:   73 / minute BP sitting:   136 / 64  (right arm) Cuff size:   regular  Vitals Entered By: Gweneth Dimitri RN (June 21, 2009 4:33 PM)  O2 Flow:  Room air CC: 3 month follow up.  Pt states breathing doing well overall.  Denies wheezing, chest tightness and cough.  Denies problems with sinuses.  No complaints.     Comments Medications reviewed with patient Daytime contact number verified with patient. Gweneth Dimitri RN  June 21, 2009 4:33 PM    Physical Exam  Additional Exam:  Gen: Pleasant, well nourished, in no distress ENT: no lesions, clear discharge.  Neck: No JVD, no TMG, no carotid bruits Lungs:clear Cardiovascular: RRR, heart sounds normal, no murmurs or gallops, no peripheral edema Musculoskeletal: No deformities, no cyanosis or clubbing     Pre-Spirometry FEV1    Value: 2.05 L     Pred: 2.20 L     Impression & Recommendations:  Problem # 1:  ASTHMA, EXTRINSIC (ICD-493.00) Assessment Improved  Moderate persistent asthma improved plan No change in inhaled medications.   Maintain treatment program as currently prescribed.  Medications Added to Medication List This Visit: 1)  Fluticasone Propionate 50 Mcg/act Susp (Fluticasone propionate) .... Two sprays each  nostril daily  Complete Medication List: 1)  Meloxicam 15 Mg Tabs (Meloxicam) .... One tablet daily 2)  Diovan 160 Mg Tabs (Valsartan) .... One tablet daily. 3)  Vitamin B-1 100 Mg Tabs (Thiamine hcl) .... One as needed 4)  Multivitamins Tabs (Multiple vitamin) .... One tablet daily. 5)  Zyrtec Allergy 10 Mg Tabs (Cetirizine hcl) .... By mouth daily 6)  Singulair 10 Mg Tabs (Montelukast sodium) .... One tablet daily. 7)  Paxil Cr 25 Mg Xr24h-tab (Paroxetine hcl) .Marland Kitchen.. 1 by mouth daily 8)  Proair Hfa 108 (90 Base) Mcg/act Aers (Albuterol sulfate) .... Inhale 2 puffs every 4-6 hours as needed. 9)  Skelaxin 800 Mg Tabs (Metaxalone) .... Take 1/2 as needed 10)  Crestor 20 Mg Tabs (Rosuvastatin calcium) .... One tablet daily. 11)  Actos 30 Mg Tabs (Pioglitazone hcl) .Marland Kitchen.. 1 by mouth daily 12)  Clonazepam 0.5 Mg Tabs (Clonazepam) .... 1/2 by mouth dailly 13)  Symbicort 160-4.5 Mcg/act Aero (Budesonide-formoterol fumarate) .... 2 puffs 2 times a day 14)  Fluticasone Propionate 50 Mcg/act Susp (Fluticasone propionate) .... Two  sprays each nostril daily  Other Orders: Est. Patient Level III (69629)  Patient Instructions: 1)  No change in medications 2)  Return in      4    months Prescriptions: FLUTICASONE PROPIONATE 50 MCG/ACT SUSP (FLUTICASONE PROPIONATE) two sprays each nostril daily  #3 x 4   Entered and Authorized by:   Storm Frisk MD   Signed by:   Storm Frisk MD on 06/21/2009   Method used:   Electronically to        [facility]* (retail)       [address] [place], [place]  [address]       Ph: [phone] or [phone]       Fax: [phone]   RxID:   [id number] SYMBICORT 160-4.5 MCG/ACT AERO (BUDESONIDE-FORMOTEROL FUMARATE) 2 puffs 2 times a day  #3 x 4   Entered and Authorized by:   Storm Frisk MD   Signed by:   Storm Frisk MD on 06/21/2009   Method used:   Electronically to        [facility]* (retail)       [address] [place], [place]  [address]       Ph: [phone] or [phone]       Fax: [phone]   RxID:   [id number] SINGULAIR 10 MG  TABS (MONTELUKAST SODIUM) One tablet daily.  #90 x 4   Entered and Authorized by:   Storm Frisk MD   Signed by:   Storm Frisk MD on 06/21/2009   Method used:   Electronically to        [facility]* (retail)       [address] [place], [place]  [address]       Ph: [phone] or [phone]       Fax: [phone]   RxID:   [id number]    Immunization History:  Pneumovax Immunization History:    Pneumovax:  historical (12/24/2007)

## 2010-04-26 NOTE — Assessment & Plan Note (Signed)
Summary: Pulmonary OV   Copy to:  Ranjan sharma Primary Provider/Referring Provider:  Adela Lank, MD  CC:  3 week follow up.  Pt states she is better since last OV but still having SOB with any exertion and chest heaviness/tightness x2wks.  Denies wheezing and cough..  History of Present Illness: 77  years old female who presents with asthma with reflux disease and mild sinusitis as triggers  for the patient's asthma.  The patient had received immunotherapy in the past without improvement.  She had did have positive skin testing.  However, her IgE level obtained at the last visit was only 4.5.  Sinus CT scan showed only mild sinusitis without overt infection.  She is not a Xolair candidate due to the low IgE levels.   October 13, 2009 10:03 AM Micah Flesher outside in heat and did water aerobics and now worse x one week.   Symptoms : tight, green, cough, hoarse, raw in throat, nose is ok,  feels tired,  pt is more dyspneic. Nares are draining.  Sinuses have some pressure.  Pt feels very fatigued.   ? of anemia. At last ov pt was to try pulmicort and stop symbicort, she has not yet done this  November 03, 2009 2:20 PM Pt took pred and abx for sinusitis.  Now is better but is having reflux.  Has Fe and this causes burping and belching.  The patient notes now is less dyspneic. There is no cough or wheezing.  There is no chest pain.   Pt denies any significant sore throat, nasal congestion or excess secretions, fever, chills, sweats, unintended weight loss, pleurtic or exertional chest pain, orthopnea PND, or leg swelling Pt denies any increase in rescue therapy over baseline, denies waking up needing it or having any early am or nocturnal exacerbations of coughing/wheezing/or dyspnea.    Preventive Screening-Counseling & Management  Alcohol-Tobacco     Smoking Status: quit > 6 months     Year Quit: 1970     Pack years: 2 cigarettes daily x10 years  Current Medications (verified): 1)  Meloxicam 15 Mg   Tabs (Meloxicam) .... One Tablet Daily 2)  Diovan 160 Mg  Tabs (Valsartan) .... One Tablet Daily. 3)  Multivitamins   Tabs (Multiple Vitamin) .... One Tablet Daily. 4)  Zyrtec Allergy 10 Mg  Tabs (Cetirizine Hcl) .... By Mouth Daily 5)  Singulair 10 Mg  Tabs (Montelukast Sodium) .... One Tablet Daily. 6)  Paxil Cr 25 Mg Xr24h-Tab (Paroxetine Hcl) .Marland Kitchen.. 1 By Mouth Daily 7)  Proair Hfa 108 (90 Base) Mcg/act  Aers (Albuterol Sulfate) .... Inhale 2 Puffs Every 4-6 Hours As Needed. 8)  Skelaxin 800 Mg  Tabs (Metaxalone) .... Take 1/2 As Needed 9)  Actos 30 Mg  Tabs (Pioglitazone Hcl) .Marland Kitchen.. 1 By Mouth Daily 10)  Clonazepam 0.5 Mg  Tabs (Clonazepam) .... 1/2 By Mouth Dailly 11)  Symbicort 160-4.5 Mcg/act  Aero (Budesonide-Formoterol Fumarate) .... Two Puffs Twice Daily Cancel Pulmicort Rx 12)  Fluticasone Propionate 50 Mcg/act Susp (Fluticasone Propionate) .... Two Sprays Each Nostril Daily 13)  Super Calcium/d 600-125 Mg-Unit Tabs (Calcium-Vitamin D) .... Take Two By Mouth Once Daily 14)  Omeprazole 20 Mg Cpdr (Omeprazole) .... One Tablet By Mouth Once Daily 15)  Iron 325 (65 Fe) Mg Tabs (Ferrous Sulfate) .Marland Kitchen.. 1 By Mouth Two Times A Day  Allergies (verified): 1)  ! * Morphine Sulfate  Past History:  Past medical, surgical, family and social histories (including risk factors) reviewed, and no  changes noted (except as noted below).  Past Medical History: Reviewed history from 10/23/2009 and no changes required. CONSTIPATION, CHRONIC, HX OF (ICD-V12.79) ANXIETY (ICD-300.00) FIBROMYALGIA (ICD-729.1) ARTHRITIS (ICD-716.90) HYPERTENSION (ICD-401.9) HYPERLIPIDEMIA (ICD-272.4) GERD (ICD-530.81) ALLERGIC RHINITIS (ICD-477.9) Asthma Hemorrhoids, internal and external Interstitial Cystitis     -[facility]  Past Surgical History: Reviewed history from 10/23/2009 and no changes required. Cataract Extraction bilateral Total Hip Arthroplasty (bilateral) Hysterectomy Ovarian cyst  removed Lumbar disc surgery  Past Pulmonary History:  Pulmonary History:      Exam Type: CT Sinus Results: IMPRESSION: Very mild chronic sinusitis bilaterally.  Pulmonary Function Test  Date: 08/13/2007 Height (in.): 66 Gender: Female  Pre-Spirometry  FVC     Value: 2.68 L/min   Pred: 2.94 L/min     % Pred: 91 % FEV1     Value: 2.05 L     Pred: 2.20 L     % Pred: 93 % FEV1/FVC   Value: 76 %     Pred: 77 %     FEF 25-75   Value: 1.72 L/min   Pred: 1.94 L/min     % Pred: 88 %  Comments:  Normal spirometry  Evaluation:  normal  Family History: Reviewed history from 10/23/2009 and no changes required. non contrib No FH of Colon Cancer: Family History of Heart Disease: father  Social History: Reviewed history from 10/23/2009 and no changes required. Former smoker Married with two children Retired Scientist, clinical (histocompatibility and immunogenetics) Alcohol Use - no Daily Caffeine Use alot of soda Illicit Drug Use - no  Review of Systems       The patient complains of shortness of breath with activity.  The patient denies shortness of breath at rest, productive cough, non-productive cough, coughing up blood, chest pain, irregular heartbeats, acid heartburn, indigestion, loss of appetite, weight change, abdominal pain, difficulty swallowing, sore throat, tooth/dental problems, headaches, nasal congestion/difficulty breathing through nose, sneezing, itching, ear ache, anxiety, depression, hand/feet swelling, joint stiffness or pain, rash, change in color of mucus, and fever.    Vital Signs:  Patient profile:   77 year old female Height:      65 inches Weight:      167.38 pounds BMI:     27.95 O2 Sat:      97 % on Room air Temp:     98.4 degrees F oral Pulse rate:   79 / minute BP sitting:   120 / 70  (left arm) Cuff size:   regular  Vitals Entered By: Gweneth Dimitri RN (November 03, 2009 2:22 PM)  O2 Flow:  Room air CC: 3 week follow up.  Pt states she is better since last OV but still having SOB with any  exertion and chest heaviness/tightness x2wks.  Denies wheezing and cough. Comments Medications reviewed with patient Daytime contact number verified with patient. Gweneth Dimitri RN  November 03, 2009 2:25 PM    Physical Exam  Additional Exam:  Gen: Pleasant, well nourished, in no distress ENT: no lesions, clear discharge.  Neck: No JVD, no TMG, no carotid bruits Lungs clear Cardiovascular: RRR, heart sounds normal, no murmurs or gallops, no peripheral edema Musculoskeletal: No deformities, no cyanosis or clubbing     Pre-Spirometry FEV1    Value: 2.05 L     Pred: 2.20 L     Impression & Recommendations:  Problem # 1:  ACUTE BRONCHITIS (ICD-466.0) Assessment Improved resolved acute bronchitis, stable asthma plan cont symbicort on schedule  The following medications were removed from the  medication list:    Keflex 250 Mg Caps (Cephalexin) .Marland Kitchen... Take one by mouth four times a day Her updated medication list for this problem includes:    Singulair 10 Mg Tabs (Montelukast sodium) ..... One tablet daily.    Proair Hfa 108 (90 Base) Mcg/act Aers (Albuterol sulfate) ..... Inhale 2 puffs every 4-6 hours as needed.    Symbicort 160-4.5 Mcg/act Aero (Budesonide-formoterol fumarate) .Marland Kitchen..Marland Kitchen Two puffs twice daily cancel pulmicort rx  Orders: Est. Patient Level IV ([address])  Problem # 2:  ASTHMA, EXTRINSIC (ICD-493.00) Assessment: Improved  Moderate persistent asthma improved plan stay on Laba/ics for now  Problem # 3:  ANEMIA, IRON DEFICIENCY (ICD-280.9) Assessment: Unchanged Hx of Fe deficiency anemia with neg GI w/u to date plan recheck CBC cont fe supp Her updated medication list for this problem includes:    Iron 325 (65 Fe) Mg Tabs (Ferrous sulfate) .Marland Kitchen... 1 by mouth two times a day  Orders: Est. Patient Level IV ([address]) TLB-CBC Platelet - w/Differential (85025-CBCD)  Medications Added to Medication List This Visit: 1)  Meloxicam 15 Mg Tabs (Meloxicam) .... One tablet daily  hold for now  Complete Medication List: 1)  Meloxicam 15 Mg Tabs (Meloxicam) .... One tablet daily hold for now 2)  Diovan 160 Mg Tabs (Valsartan) .... One tablet daily. 3)  Multivitamins Tabs (Multiple vitamin) .... One tablet daily. 4)  Zyrtec Allergy 10 Mg Tabs (Cetirizine hcl) .... By mouth daily 5)  Singulair 10 Mg Tabs (Montelukast sodium) .... One tablet daily. 6)  Paxil Cr 25 Mg Xr24h-tab (Paroxetine hcl) .Marland Kitchen.. 1 by mouth daily 7)  Proair Hfa 108 (90 Base) Mcg/act Aers (Albuterol sulfate) .... Inhale 2 puffs every 4-6 hours as needed. 8)  Skelaxin 800 Mg Tabs (Metaxalone) .... Take 1/2 as needed 9)  Actos 30 Mg Tabs (Pioglitazone hcl) .Marland Kitchen.. 1 by mouth daily 10)  Clonazepam 0.5 Mg Tabs (Clonazepam) .... 1/2 by mouth dailly 11)  Symbicort 160-4.5 Mcg/act Aero (Budesonide-formoterol fumarate) .... Two puffs twice daily cancel pulmicort rx 12)  Fluticasone Propionate 50 Mcg/act Susp (Fluticasone propionate) .... Two sprays each nostril daily 13)  Super Calcium/d 600-125 Mg-unit Tabs (Calcium-vitamin d) .... Take two by mouth once daily 14)  Omeprazole 20 Mg Cpdr (Omeprazole) .... One tablet by mouth once daily 15)  Iron 325 (65 Fe) Mg Tabs (Ferrous sulfate) .Marland Kitchen.. 1 by mouth two times a day  Patient Instructions: 1)  Labs today 2)  Stop meloxicam 3)  No other medication changes 4)  May use tylenol for arthritis off meloxicam 5)  Return 2 months  Appended Document: Pulmonary OV fax Adela Lank

## 2010-04-26 NOTE — Procedures (Signed)
Summary: Colonoscopy  Patient: [name] Note: All result statuses are Final unless otherwise noted.  Tests: (1) Colonoscopy (COL)   COL Colonoscopy           DONE     [facility]     [address]     [place], [place]  [address]           COLONOSCOPY PROCEDURE REPORT     PATIENT:  [name], [name]  MR#:  [id number]     BIRTHDATE:  03-24-1934, 76 yrs. old  GENDER:  female     ENDOSCOPIST:  Judie Petit T. Russella Dar, MD, [facility]           PROCEDURE DATE:  11/14/2009     PROCEDURE:  Colonoscopy with biopsy     ASA CLASS:  Class II     INDICATIONS:  1) Iron deficiency anemia  2) unexplained diarrhea           MEDICATIONS:   Fentanyl 100 mcg IV, Versed 8 mg IV     DESCRIPTION OF PROCEDURE:   After the risks benefits and     alternatives of the procedure were thoroughly explained, informed     consent was obtained.  Digital rectal exam was performed and     revealed external hemorrhoids.  The LB [id number]-[id number]     endoscope was introduced through the anus and advanced to the     cecum, which was identified by both the appendix and ileocecal     valve, limited by a tortuous colon. The quality of the prep was     good, using MoviPrep.  The instrument was then slowly withdrawn as     the colon was fully examined.     <<PROCEDUREIMAGES>>     FINDINGS:  A normal appearing cecum, ileocecal valve, and     appendiceal orifice were identified. The ascending, hepatic     flexure, transverse, splenic flexure, descending, sigmoid colon,     and rectum appeared unremarkable.  Random biopsies were obtained     and sent to pathology.   Retroflexed views in the rectum revealed     internal hemorrhoids, small.  The time to cecum =  6.75  minutes.     The scope was then withdrawn (time =  8.25  min) from the patient     and the procedure completed.           COMPLICATIONS:  None           ENDOSCOPIC IMPRESSION:     1) Normal colon     2) Internal and external hemorrhoids        RECOMMENDATIONS:     1) Await pathology results     2) No plans for future screening colonoscopies given age and     normal exam today           Malcolm T. Russella Dar, MD, [facility]           CC: Adela Lank, MD           n.     Rosalie DoctorVenita Lick. Stark at 11/14/2009 01:59 PM           [name] Sours, [id number]  Note: An exclamation mark (!) indicates a result that was not dispersed into the flowsheet. Document Creation Date: 11/14/2009 2:01 PM _______________________________________________________________________  (1) Order result status: Final Collection or observation date-time: 11/14/2009 13:54 Requested date-time:  Receipt date-time:  Reported date-time:  Referring Physician:  Ordering Physician: Claudette Head [id number]) Specimen Source:  Source: Launa Grill Order Number: [id number] Lab site:

## 2010-04-26 NOTE — Letter (Signed)
Summary: Patient Notice- Colon Biospy Results  [facility]  [address] [place], [place] [address]   Phone: [phone]  Fax: [phone]        November 16, 2009 MRN: [id number]    [name] [address] [place], [place]  [address]    Dear Ms. Lindor,  I am pleased to inform you that the biopsies taken during your recent colonoscopy did not show any evidence of cancer upon pathologic examination. The colonic biopsies were normal. The duodenal biopsies showed duodenitis.  Continue with the treatment plan as outlined on the day of your      exam.  Please call us if you are having persistent problems or have questions about your condition that have not been fully answered at this time.  Sincerely,  Meryl Dare MD [facility]  This letter has been electronically signed by your physician.  Appended Document: Patient Notice- Colon Biospy Results letter mailed

## 2010-04-26 NOTE — Assessment & Plan Note (Signed)
Summary: Acute NP office visit - bronchitis   Copy to:  Ranjan sharma Primary Chantea Surace/Referring Offie Waide:  Adela Lank, MD  CC:  prod cough with small amount of yellow mucus, wheezing, hoarseness, and some increased SOB x3days - denies f/c/s.  History of Present Illness: 77  years old female who presents with asthma with reflux disease and mild sinusitis as triggers  for the patient's asthma.  The patient had received immunotherapy in the past without improvement.  She had did have positive skin testing.  However, her IgE level obtained at the last visit was only 4.5.  Sinus CT scan showed only mild sinusitis without overt infection.  She is not a Xolair candidate due to the low IgE levels.   October 13, 2009 10:03 AM Micah Flesher outside in heat and did water aerobics and now worse x one week.   Symptoms : tight, green, cough, hoarse, raw in throat, nose is ok,  feels tired,  pt is more dyspneic. Nares are draining.  Sinuses have some pressure.  Pt feels very fatigued.   ? of anemia. At last ov pt was to try pulmicort and stop symbicort, she has not yet done this  November 03, 2009 2:20 PM Pt took pred and abx for sinusitis.  Now is better but is having reflux.  Has Fe and this causes burping and belching.  The patient notes now is less dyspneic. There is no cough or wheezing.  There is no chest pain.     March 05, 2010 -Presents for an acute office visit. Complains of prod cough with small amount of yellow mucus, wheezing, hoarseness, some increased SOB x3-4 days. She is under enormous stress. 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 "do not have time to be sick". Also has cold sore for last 1 week on top lips. Denies chest pain, , orthopnea, hemoptysis, fever, n/v/d, edema, headache,recent travel or antibiotics.   Medications Prior to Update: 1)  Meloxicam 15 Mg  Tabs (Meloxicam) .... One Tablet Daily Hold For Now 2)  Diovan 160 Mg  Tabs (Valsartan) .... One Tablet Daily. 3)   Multivitamins   Tabs (Multiple Vitamin) .... One Tablet Daily. 4)  Zyrtec Allergy 10 Mg  Tabs (Cetirizine Hcl) .... By Mouth Daily 5)  Singulair 10 Mg  Tabs (Montelukast Sodium) .... One Tablet Daily. 6)  Paxil Cr 25 Mg Xr24h-Tab (Paroxetine Hcl) .Marland Kitchen.. 1 By Mouth Daily 7)  Proair Hfa 108 (90 Base) Mcg/act  Aers (Albuterol Sulfate) .... Inhale 2 Puffs Every 4-6 Hours As Needed. 8)  Skelaxin 800 Mg  Tabs (Metaxalone) .... Take 1/2 As Needed 9)  Actos 30 Mg  Tabs (Pioglitazone Hcl) .Marland Kitchen.. 1 By Mouth Daily 10)  Clonazepam 0.5 Mg  Tabs (Clonazepam) .... 1/2 By Mouth Dailly 11)  Symbicort 160-4.5 Mcg/act  Aero (Budesonide-Formoterol Fumarate) .... Two Puffs Twice Daily Cancel Pulmicort Rx 12)  Fluticasone Propionate 50 Mcg/act Susp (Fluticasone Propionate) .... Two Sprays Each Nostril Daily 13)  Super Calcium/d 600-125 Mg-Unit Tabs (Calcium-Vitamin D) .... Take Two By Mouth Once Daily 14)  Omeprazole 20 Mg Cpdr (Omeprazole) .... One Tablet By Mouth Once Daily 15)  Iron 325 (65 Fe) Mg Tabs (Ferrous Sulfate) .Marland Kitchen.. 1 By Mouth Two Times A Day 16)  Tylenol Arthritis Pain 650 Mg Cr-Tabs (Acetaminophen) .... Take 2 Tablets By Mouth Two Times A Day  Current Medications (verified): 1)  Meloxicam 15 Mg  Tabs (Meloxicam) .... One Tablet Daily Hold For Now 2)  Diovan 160 Mg  Tabs (Valsartan) .Marland KitchenMarland KitchenMarland Kitchen  One Tablet Daily. 3)  Multivitamins   Tabs (Multiple Vitamin) .... One Tablet Daily. 4)  Zyrtec Allergy 10 Mg  Tabs (Cetirizine Hcl) .... By Mouth Daily 5)  Singulair 10 Mg  Tabs (Montelukast Sodium) .... One Tablet Daily. 6)  Paxil Cr 25 Mg Xr24h-Tab (Paroxetine Hcl) .Marland Kitchen.. 1 By Mouth Daily 7)  Proair Hfa 108 (90 Base) Mcg/act  Aers (Albuterol Sulfate) .... Inhale 2 Puffs Every 4-6 Hours As Needed. 8)  Skelaxin 800 Mg  Tabs (Metaxalone) .... Take 1/2 As Needed 9)  Actos 30 Mg  Tabs (Pioglitazone Hcl) .Marland Kitchen.. 1 By Mouth Daily 10)  Clonazepam 0.5 Mg  Tabs (Clonazepam) .... 1/2 By Mouth Dailly 11)  Symbicort 160-4.5 Mcg/act   Aero (Budesonide-Formoterol Fumarate) .... Two Puffs Twice Daily Cancel Pulmicort Rx 12)  Fluticasone Propionate 50 Mcg/act Susp (Fluticasone Propionate) .... Two Sprays Each Nostril Daily 13)  Super Calcium/d 600-125 Mg-Unit Tabs (Calcium-Vitamin D) .... Take Two By Mouth Once Daily 14)  Omeprazole 20 Mg Cpdr (Omeprazole) .... One Tablet By Mouth Once Daily 15)  Iron 325 (65 Fe) Mg Tabs (Ferrous Sulfate) .Marland Kitchen.. 1 By Mouth Two Times A Day 16)  Tylenol Arthritis Pain 650 Mg Cr-Tabs (Acetaminophen) .... Take 2 Tablets By Mouth Two Times A Day  Allergies (verified): 1)  ! * Morphine Sulfate  Past History:  Past Medical History: Last updated: 10/23/2009 CONSTIPATION, CHRONIC, HX OF (ICD-V12.79) ANXIETY (ICD-300.00) FIBROMYALGIA (ICD-729.1) ARTHRITIS (ICD-716.90) HYPERTENSION (ICD-401.9) HYPERLIPIDEMIA (ICD-272.4) GERD (ICD-530.81) ALLERGIC RHINITIS (ICD-477.9) Asthma Hemorrhoids, internal and external Interstitial Cystitis     -Darvin Neighbours GU  Past Surgical History: Last updated: 10/23/2009 Cataract Extraction bilateral Total Hip Arthroplasty (bilateral) Hysterectomy Ovarian cyst removed Lumbar disc surgery  Family History: Last updated: 10/23/2009 non contrib No FH of Colon Cancer: Family History of Heart Disease: father  Social History: Last updated: 11/13/2009 Former smoker Married with two children Retired Med [employer] Alcohol Use - no Daily Caffeine Use coffee in AM Illicit Drug Use - no  Risk Factors: Smoking Status: quit > 6 months (11/03/2009)  Past Pulmonary History:  Pulmonary History:      Exam Type: CT Sinus Results: IMPRESSION: Very mild chronic sinusitis bilaterally.  Pulmonary Function Test  Date: 08/13/2007 Height (in.): 66 Gender: Female  Pre-Spirometry  FVC     Value: 2.68 L/min   Pred: 2.94 L/min     % Pred: 91 % FEV1     Value: 2.05 L     Pred: 2.20 L     % Pred: 93 % FEV1/FVC   Value: 76 %     Pred: 77 %     FEF 25-75   Value: 1.72 L/min    Pred: 1.94 L/min     % Pred: 88 %  Comments:  Normal spirometry  Evaluation:  normal  Review of Systems      See HPI  Vital Signs:  Patient profile:   77 year old female Height:      65 inches Weight:      164.13 pounds BMI:     27.41 O2 Sat:      97 % on Room air Temp:     98.1 degrees F oral Pulse rate:   81 / minute BP sitting:   140 / 80  (left arm) Cuff size:   regular  Vitals Entered By: Boone Master CNA/MA (March 05, 2010 12:10 PM)  O2 Flow:  Room air CC: prod cough with small amount of yellow mucus, wheezing, hoarseness, some increased SOB x3days -  denies f/c/s Is Patient Diabetic? Yes Comments Medications reviewed with patient Daytime contact number verified with patient. Boone Master CNA/MA  March 05, 2010 12:11 PM    Physical Exam  Additional Exam:  Gen: Pleasant, well nourished, in no distress ENT: no lesions, clear discharge. ., hoarse voice  Neck: No JVD, no TMG, no carotid bruits Lungs coarse BS w/ no wheezing  Cardiovascular: RRR, heart sounds normal, no murmurs or gallops, no peripheral edema Musculoskeletal: No deformities, no cyanosis or clubbing     Impression & Recommendations:  Problem # 1:  ACUTE BRONCHITIS (ICD-466.0)  Flare w/ HSV 1  Zpack take as directed.  Mucinex DM two times a day as needed cough/congestion Hydromet 1-2 tsp every 4-6 hr as needed cough, may make you sleepy.  Prednisone taper over next week.  Valtrex 1 gram two times a day x 1 day for cold sore.  Please contact office for sooner follow up if symptoms do not improve or worsen  follow up Dr. Delford Field 6 weeks  Her updated medication list for this problem includes:    Singulair 10 Mg Tabs (Montelukast sodium) ..... One tablet daily.    Proair Hfa 108 (90 Base) Mcg/act Aers (Albuterol sulfate) ..... Inhale 2 puffs every 4-6 hours as needed.    Symbicort 160-4.5 Mcg/act Aero (Budesonide-formoterol fumarate) .Marland Kitchen..Marland Kitchen Two puffs twice daily cancel pulmicort rx     Zithromax Z-pak 250 Mg Tabs (Azithromycin) .Marland Kitchen... Take as directed    Hydromet 5-1.5 Mg/29ml Syrp (Hydrocodone-homatropine) .Marland Kitchen... 1-2 tsp every 4-6 hrs as needed cough , may make you sleepy  Orders: Est. Patient Level IV (16109)  Medications Added to Medication List This Visit: 1)  Zithromax Z-pak 250 Mg Tabs (Azithromycin) .... Take as directed 2)  Valtrex 1 Gm Tabs (Valacyclovir hcl) .... 2 by mouth two times a day x 1 day 3)  Hydromet 5-1.5 Mg/5ml Syrp (Hydrocodone-homatropine) .Marland Kitchen.. 1-2 tsp every 4-6 hrs as needed cough , may make you sleepy 4)  Prednisone 10 Mg Tabs (Prednisone) .... 4 tabs for 2 days, then 3 tabs for 2 days, 2 tabs for 2 days, then 1 tab for 2 days, then stop  Complete Medication List: 1)  Meloxicam 15 Mg Tabs (Meloxicam) .... One tablet daily hold for now 2)  Diovan 160 Mg Tabs (Valsartan) .... One tablet daily. 3)  Multivitamins Tabs (Multiple vitamin) .... One tablet daily. 4)  Zyrtec Allergy 10 Mg Tabs (Cetirizine hcl) .... By mouth daily 5)  Singulair 10 Mg Tabs (Montelukast sodium) .... One tablet daily. 6)  Paxil Cr 25 Mg Xr24h-tab (Paroxetine hcl) .Marland Kitchen.. 1 by mouth daily 7)  Proair Hfa 108 (90 Base) Mcg/act Aers (Albuterol sulfate) .... Inhale 2 puffs every 4-6 hours as needed. 8)  Skelaxin 800 Mg Tabs (Metaxalone) .... Take 1/2 as needed 9)  Actos 30 Mg Tabs (Pioglitazone hcl) .Marland Kitchen.. 1 by mouth daily 10)  Clonazepam 0.5 Mg Tabs (Clonazepam) .... 1/2 by mouth dailly 11)  Symbicort 160-4.5 Mcg/act Aero (Budesonide-formoterol fumarate) .... Two puffs twice daily cancel pulmicort rx 12)  Fluticasone Propionate 50 Mcg/act Susp (Fluticasone propionate) .... Two sprays each nostril daily 13)  Super Calcium/d 600-125 Mg-unit Tabs (Calcium-vitamin d) .... Take two by mouth once daily 14)  Omeprazole 20 Mg Cpdr (Omeprazole) .... One tablet by mouth once daily 15)  Iron 325 (65 Fe) Mg Tabs (Ferrous sulfate) .Marland Kitchen.. 1 by mouth two times a day 16)  Tylenol Arthritis Pain 650 Mg  Cr-tabs (Acetaminophen) .... Take 2 tablets by mouth  two times a day 17)  Zithromax Z-pak 250 Mg Tabs (Azithromycin) .... Take as directed 18)  Valtrex 1 Gm Tabs (Valacyclovir hcl) .... 2 by mouth two times a day x 1 day 19)  Hydromet 5-1.5 Mg/39ml Syrp (Hydrocodone-homatropine) .Marland Kitchen.. 1-2 tsp every 4-6 hrs as needed cough , may make you sleepy 20)  Prednisone 10 Mg Tabs (Prednisone) .... 4 tabs for 2 days, then 3 tabs for 2 days, 2 tabs for 2 days, then 1 tab for 2 days, then stop  Patient Instructions: 1)  Zpack take as directed.  2)  Mucinex DM two times a day as needed cough/congestion 3)  Hydromet 1-2 tsp every 4-6 hr as needed cough, may make you sleepy.  4)  Prednisone taper over next week.  5)  Valtrex 1 gram 2 tabs two times a day x 1 day for cold sore  6)  Please contact office for sooner follow up if symptoms do not improve or worsen  7)  follow up Dr. Delford Field 6 weeks  Prescriptions: PREDNISONE 10 MG TABS (PREDNISONE) 4 tabs for 2 days, then 3 tabs for 2 days, 2 tabs for 2 days, then 1 tab for 2 days, then stop  #20 x 0   Entered and Authorized by:   Rubye Oaks NP   Signed by:   [name] Parrett NP on 03/05/2010   Method used:   Electronically to        [facility]* (retail)       [address] [place], [place]  [address]       Ph: [phone] or [phone]       Fax: [phone]   RxID:   [id number] HYDROMET 5-1.5 MG/5ML SYRP (HYDROCODONE-HOMATROPINE) 1-2 tsp every 4-6 hrs as needed cough , may make you sleepy  #8 oz x 0   Entered and Authorized by:   Rubye Oaks NP   Signed by:   [name] Parrett NP on 03/05/2010   Method used:   Print then Give to Patient   RxID:   [id number] VALTREX 1 GM TABS (VALACYCLOVIR HCL) 2 by mouth two times a day x 1 day  #4 x 0   Entered and Authorized by:   Rubye Oaks NP   Signed by:   [name] Parrett NP on 03/05/2010   Method used:   Electronically to        [facility]* (retail)       [address] [place], [place]  [address]       Ph: [phone] or [phone]       Fax: [phone]   RxID:   [id number] ZITHROMAX Z-PAK 250 MG TABS (AZITHROMYCIN) take as directed  #1 x 0   Entered and Authorized by:   Rubye Oaks NP   Signed by:   [name] Parrett NP on 03/05/2010   Method used:   Electronically to        [facility]* (retail)       [address] [place], [place]  [address]       Ph: [phone] or [phone]       Fax: [phone]   RxID:  [id number]    Immunization History:  Influenza Immunization History:    Influenza:  historical (12/23/2009)

## 2010-04-26 NOTE — Procedures (Signed)
Summary: Upper Endoscopy w/DIL  Patient: [name] Note: All result statuses are Final unless otherwise noted.  Tests: (1) Upper Endoscopy w/DIL (UED)  UED Upper Endoscopy w/DIL                             DONE     [facility]     [address]     [place], [place]  [address]           ENDOSCOPY PROCEDURE REPORT     PATIENT:  [name], [name]  MR#:  [id number]     BIRTHDATE:  Dec 25, 1933, 76 yrs. old  GENDER:  female     ENDOSCOPIST:  Judie Petit T. Russella Dar, MD, [facility]           PROCEDURE DATE:  11/14/2009     PROCEDURE:  EGD with dilatation over guidewire, EGD with biopsy     ASA CLASS:  Class II     INDICATIONS:  1) dysphagia  2) GERD  3) iron deficiency anemia     MEDICATIONS:  There was residual sedation effect present from     prior procedure, Versed 2 mg IV     TOPICAL ANESTHETIC:  Exactacain Spray     DESCRIPTION OF PROCEDURE:   After the risks benefits and     alternatives of the procedure were thoroughly explained, informed     consent was obtained.  The [facility] [id number] endoscope was     introduced through the mouth and advanced to the second portion of     the duodenum, without limitations.  The instrument was slowly     withdrawn as the mucosa was carefully examined.     <<PROCEDUREIMAGES>>     The esophagus and gastroesophageal junction were completely normal     in appearance.  The stomach was entered and closely examined. The     angularis, and lesser curvature were well visualized, including a     retroflexed view of the cardia and fundus. The stomach wall was     normally distensable. The scope passed easily through the pylorus     into the duodenum. The duodenal bulb was normal in appearance, as     was the postbulbar duodenum. Random biopsies were obtained and     sent to pathology.  Mild gastritis was found in the antrum and     prepyloric area. It was erythematous.    Dilation was then     performed at the total esophagus for dysphagia without a   stricture:           1) Dilator:  Savary over guidewire  Size:  17 mm     Resistance:  none  Heme:  none           COMPLICATIONS:  None           ENDOSCOPIC IMPRESSION:     1) Mild gastritis           RECOMMENDATIONS:     1) await pathology results     2) anti-reflux regimen long term     3) continue PPI qam long term, increase strength of PPI if     symptoms not fully controlled     4) post dilation instructions     5) follow up with Dr. Juleen China, if fe def anemia does not resolve or     recurs consider SB capsule endoscopy  Venita Lick. Russella Dar, MD, [facility]           n.     eSIGNED:   Venita Lick. Stark at 11/14/2009 02:10 PM           [name], [id number]  Note: An exclamation mark (!) indicates a result that was not dispersed into the flowsheet. Document Creation Date: 11/14/2009 2:12 PM _______________________________________________________________________  (1) Order result status: Final Collection or observation date-time: 11/14/2009 14:04 Requested date-time:  Receipt date-time:  Reported date-time:  Referring Physician:   Ordering Physician: Claudette Head [phone]) Specimen Source:  Source: Launa Grill Order Number: [id number] Lab site:

## 2010-04-26 NOTE — Assessment & Plan Note (Signed)
Summary: dysphagia, bloating/sheri   History of Present Illness Visit Type: Initial Visit Primary GI MD: Elie Goody MD [facility] Primary Provider: Adela Lank, MD Chief Complaint: Patient noticed that when she has milk she has more diarrhea so she has been taking Lactaid, and it helps. She has some solid food dysphagia since April.   History of Present Illness:   This is a 77 year old female seen in the past for GERD, gastritis and chronic constipation. She underwent colonoscopy in May 2004 which showed hemorrhoids, and an upper endoscopy in May 2005 that showed gastritis. She was treated for presumed GERD as well.  She has noted 4-6 loose stools, primarily in the mornings and after meals for the past 2 months associated with abdominal bloating and. She has been on several antibiotics over the past several weeks. Her diarrhea started before she started antibiotics. She has worsening diarrhea and intestinal gas with lactose products and she now avoids latose.  She has noted recurrent reflux symptoms and intermittent solid food dysphagia for the past several months. CBC recently performed Dr. Juleen China office revealed a hemoglobin of 10.9, and is an MCV of 93.7. A comprehensive metabolic panel and TSH were unremarkable.   GI Review of Systems    Reports bloating and  dysphagia with solids.      Denies abdominal pain, acid reflux, belching, chest pain, dysphagia with liquids, heartburn, loss of appetite, nausea, vomiting, vomiting blood, weight loss, and  weight gain.      Reports diarrhea and  rectal pain.     Denies anal fissure, black tarry stools, change in bowel habit, constipation, diverticulosis, fecal incontinence, heme positive stool, hemorrhoids, irritable bowel syndrome, jaundice, light color stool, liver problems, and  rectal bleeding. Preventive Screening-Counseling & Management      Drug Use:  no.     Current Medications (verified): 1)  Meloxicam 15 Mg  Tabs (Meloxicam) .... One  Tablet Daily 2)  Diovan 160 Mg  Tabs (Valsartan) .... One Tablet Daily. 3)  Multivitamins   Tabs (Multiple Vitamin) .... One Tablet Daily. 4)  Zyrtec Allergy 10 Mg  Tabs (Cetirizine Hcl) .... By Mouth Daily 5)  Singulair 10 Mg  Tabs (Montelukast Sodium) .... One Tablet Daily. 6)  Paxil Cr 25 Mg Xr24h-Tab (Paroxetine Hcl) .Marland Kitchen.. 1 By Mouth Daily 7)  Proair Hfa 108 (90 Base) Mcg/act  Aers (Albuterol Sulfate) .... Inhale 2 Puffs Every 4-6 Hours As Needed. 8)  Skelaxin 800 Mg  Tabs (Metaxalone) .... Take 1/2 As Needed 9)  Actos 30 Mg  Tabs (Pioglitazone Hcl) .Marland Kitchen.. 1 By Mouth Daily 10)  Clonazepam 0.5 Mg  Tabs (Clonazepam) .... 1/2 By Mouth Dailly 11)  Symbicort 160-4.5 Mcg/act  Aero (Budesonide-Formoterol Fumarate) .... Two Puffs Twice Daily Cancel Pulmicort Rx 12)  Fluticasone Propionate 50 Mcg/act Susp (Fluticasone Propionate) .... Two Sprays Each Nostril Daily 13)  Prednisone 10 Mg  Tabs (Prednisone) .... Take As Directed 4 Each Am X3days, 3 X 3days, 2 X 3days, 1 X 3days Then Stop 14)  Keflex 250 Mg Caps (Cephalexin) .... Take One By Mouth Four Times A Day 15)  Colace 100 Mg Caps (Docusate Sodium) .... Take One By Mouth Once Daily 16)  Super Calcium/d 600-125 Mg-Unit Tabs (Calcium-Vitamin D) .... Take Two By Mouth Once Daily  Allergies (verified): 1)  ! * Morphine Sulfate  Past History:  Past Medical History: CONSTIPATION, CHRONIC, HX OF (ICD-V12.79) ANXIETY (ICD-300.00) FIBROMYALGIA (ICD-729.1) ARTHRITIS (ICD-716.90) HYPERTENSION (ICD-401.9) HYPERLIPIDEMIA (ICD-272.4) GERD (ICD-530.81) ALLERGIC RHINITIS (ICD-477.9) Asthma Hemorrhoids,  internal and external Interstitial Cystitis     -Darvin Neighbours GU  Past Surgical History: Cataract Extraction bilateral Total Hip Arthroplasty (bilateral) Hysterectomy Ovarian cyst removed Lumbar disc surgery  Family History: non contrib No FH of Colon Cancer: Family History of Heart Disease: father  Social History: Former smoker Married with  two children Retired Scientist, clinical (histocompatibility and immunogenetics) Alcohol Use - no Daily Caffeine Use alot of soda Illicit Drug Use - no Drug Use:  no  Review of Systems       The patient complains of allergy/sinus, anxiety-new, arthritis/joint pain, muscle pains/cramps, shortness of breath, thirst - excessive, urination - excessive, and urination changes/pain.         The pertinent positives and negatives are noted as above and in the HPI. All other ROS were reviewed and were negative.   Vital Signs:  Patient profile:   77 year old female Height:      65 inches Weight:      164.2 pounds BMI:     27.42 Pulse rate:   88 / minute Pulse rhythm:   regular BP sitting:   140 / 58  (left arm) Cuff size:   regular  Vitals Entered By: Harlow Mares CMA Duncan Dull) (October 23, 2009 2:46 PM)  Physical Exam  General:  Well developed, well nourished, no acute distress. Head:  Normocephalic and atraumatic. Eyes:  PERRLA, no icterus. Mouth:  No deformity or lesions, dentition normal. Lungs:  Clear throughout to auscultation. Heart:  Regular rate and rhythm; no murmurs, rubs,  or bruits. Abdomen:  Soft and nondistended. No masses, hepatosplenomegaly or hernias noted. Normal bowel sounds. Mild lower abdominal tenderness to deep palpation without rebound or guarding. Rectal:  Exam by Dr. Juleen China last week showed no lesions and Hemoccult-negative stool by patient report. Pulses:  Normal pulses noted. Extremities:  No clubbing, cyanosis, edema or deformities noted. Neurologic:  Alert and  oriented x4;  grossly normal neurologically. Cervical Nodes:  No significant cervical adenopathy. Inguinal Nodes:  No significant inguinal adenopathy. Psych:  Alert and cooperative. Normal mood and affect.  Impression & Recommendations:  Problem # 1:  DIARRHEA (ICD-787.91) Diarrhea for 2 months without a clear etiology. Rule out chronic intestinal infections, colitis. She appears to have a component of lactose intolerance. Avoid lactose products.  Consider colonoscopy if no cause is uncovered in symptoms do not abate. Consider an empiric course of metronidazole and/or probiotics given antibiotic usage. Orders: T-Culture, Stool ([id number]) T-Stool for Fat, Quantitative [id number]) T-Stool Fats [place] Stain [id number]) T-Stool Giardia / Crypto- EIA (40347) T-Stool for O&P ([id number]) T-Fecal WBC ([id number]) T-Culture, C-Diff Toxin A/B ([id number])  Problem # 2:  DYSPHAGIA ([facility]-932.35) Solid food dysphasia with recurrent GERD symptoms. Similar symptom complex in 2005 with no stricture noted at endoscopy. Begin treatment for GERD with omeprazole 20 mg daily, and standard antireflux measures. Consider a barium esophagram or EGD for further evaluation if her symptoms do not resolve.  Problem # 3:  GERD (ICD-530.81) See problem #2.  Problem # 4:  UNSPECIFIED ANEMIA (ICD-285.9) Normocytic anemia with Hemoccult negative stool. Standard anemia evaluation. Orders: TLB-Ferritin (82728-FER) TLB-IBC Pnl (Iron/FE;Transferrin) (83550-IBC) TLB-Folic Acid (Folate) (82746-FOL) TLB-B12, Serum-Total ONLY (82607-B12) TLB-Iron, (Fe) Total (83540-FE)  Patient Instructions: 1)  Get your labs drawn today in the basement.  2)  Pick up your prescription from your pharmacy.  3)  Avoid foods high in acid content ( tomatoes, citrus juices, spicy foods) . Avoid eating within 3 to 4 hours of lying down or before exercising. Do not  over eat; try smaller more frequent meals. Elevate head of bed four inches when sleeping.  4)  Copy sent to : Adela Lank, MD 5)  The medication list was reviewed and reconciled.  All changed / newly prescribed medications were explained.  A complete medication list was provided to the patient / caregiver.  Prescriptions: OMEPRAZOLE 20 MG CPDR (OMEPRAZOLE) one tablet by mouth once daily  #30 x 11   Entered by:   Christie Nottingham CMA (AAMA)   Authorized by:   Meryl Dare MD [facility]   Signed by:   Meryl Dare  MD [facility] on 10/23/2009   Method used:   Electronically to        [facility]* (retail)       [address] [place], [place]  [address]       Ph: [phone] or [phone]       Fax: [phone]   RxID:   [id number]

## 2010-04-26 NOTE — Procedures (Signed)
Summary: Colonoscopy   Colonoscopy  Procedure date:  08/10/2002  Findings:      Results: Hemorrhoids.     Location:  [facility].    Procedures Next Due Date:    Colonoscopy: 08/2009 Patient Name: [name], Sonya A. MRN:  Procedure Procedures: Colonoscopy CPT: [phone].  Personnel: Endoscopist: Venita Lick. Russella Dar, MD, Clementeen Graham.  Referred By: Adela Lank, MD.  Exam Location: Exam performed in Outpatient Clinic. Outpatient  Patient Consent: Procedure, Alternatives, Risks and Benefits discussed, consent obtained, from patient. Consent was obtained by the RN.  Indications Symptoms: Constipation  Average Risk Screening Routine.  History  Pre-Exam Physical: Performed Aug 10, 2002. Cardio-pulmonary exam WNL. Rectal exam abnormal. HEENT exam , Abdominal exam, Extremity exam, Neurological exam, Mental status exam WNL. Abnormal PE findings include: ext. hem.  Exam Exam: Extent of exam reached: Cecum, extent intended: Cecum.  The cecum was identified by appendiceal orifice and IC valve. Colon retroflexion performed. ASA Classification: II. Tolerance: excellent.  Monitoring: Pulse and BP monitoring, Oximetry used. Supplemental O2 given.  Colon Prep Used Golytely for colon prep. Prep results: excellent.  Sedation Meds: Patient assessed and found to be appropriate for moderate (conscious) sedation. Fentanyl 50 mcg. given IV. Versed 5 mg. given IV.  Findings NORMAL EXAM: Cecum to Sigmoid Colon.  HEMORRHOIDS: Internal. Size: Small. Not bleeding. Not thrombosed. ICD9: Hemorrhoids, Internal and  External: 455.6.   Assessment  Diagnoses: 455.6: Hemorrhoids, Internal and  External.   Events  Unplanned Interventions: No intervention was required.  Unplanned Events: There were no complications. Plans Medication Plan: Continue current medications.  Patient Education: Patient given standard instructions for: Hemorrhoids. Constipation. Disposition: After  procedure patient sent to recovery. After recovery patient sent home.  Scheduling/Referral: Colonoscopy, to [facility] T. Russella Dar, MD, [facility], around Aug 09, 2009.  Primary Care Provider, to Adela Lank, MD,    This report was created from the original endoscopy report, which was reviewed and signed by the above listed endoscopist.    cc: Adela Lank, MD

## 2010-05-21 ENCOUNTER — Other Ambulatory Visit: Payer: Self-pay | Admitting: Oncology

## 2010-05-21 ENCOUNTER — Encounter: Payer: Self-pay | Admitting: Internal Medicine

## 2010-05-21 ENCOUNTER — Encounter (HOSPITAL_BASED_OUTPATIENT_CLINIC_OR_DEPARTMENT_OTHER): Payer: Medicare Other | Admitting: Oncology

## 2010-05-21 DIAGNOSIS — D649 Anemia, unspecified: Secondary | ICD-10-CM

## 2010-05-21 DIAGNOSIS — M810 Age-related osteoporosis without current pathological fracture: Secondary | ICD-10-CM

## 2010-05-21 DIAGNOSIS — K219 Gastro-esophageal reflux disease without esophagitis: Secondary | ICD-10-CM

## 2010-05-21 DIAGNOSIS — I10 Essential (primary) hypertension: Secondary | ICD-10-CM

## 2010-05-21 LAB — IRON AND TIBC
%SAT: 20 % (ref 20–55)
Iron: 63 ug/dL (ref 42–145)
TIBC: 322 ug/dL (ref 250–470)
UIBC: 259 ug/dL

## 2010-05-21 LAB — COMPREHENSIVE METABOLIC PANEL
Albumin: 4.3 g/dL (ref 3.5–5.2)
BUN: 13 mg/dL (ref 6–23)
CO2: 27 mEq/L (ref 19–32)
Calcium: 9.1 mg/dL (ref 8.4–10.5)
Chloride: 102 mEq/L (ref 96–112)
Creatinine, Ser: 0.9 mg/dL (ref 0.40–1.20)
Glucose, Bld: 110 mg/dL — ABNORMAL HIGH (ref 70–99)
Potassium: 3.8 mEq/L (ref 3.5–5.3)

## 2010-05-21 LAB — CBC WITH DIFFERENTIAL/PLATELET
Basophils Absolute: 0 10*3/uL (ref 0.0–0.1)
Eosinophils Absolute: 0.1 10*3/uL (ref 0.0–0.5)
HCT: 36.4 % (ref 34.8–46.6)
HGB: 12.2 g/dL (ref 11.6–15.9)
MCH: 30.9 pg (ref 25.1–34.0)
NEUT#: 2.8 10*3/uL (ref 1.5–6.5)
NEUT%: 61.8 % (ref 38.4–76.8)
RDW: 15.4 % — ABNORMAL HIGH (ref 11.2–14.5)
lymph#: 1.3 10*3/uL (ref 0.9–3.3)

## 2010-05-30 ENCOUNTER — Encounter: Payer: Self-pay | Admitting: Internal Medicine

## 2010-05-30 DIAGNOSIS — Z87448 Personal history of other diseases of urinary system: Secondary | ICD-10-CM | POA: Insufficient documentation

## 2010-05-30 DIAGNOSIS — J438 Other emphysema: Secondary | ICD-10-CM | POA: Insufficient documentation

## 2010-05-30 DIAGNOSIS — G43909 Migraine, unspecified, not intractable, without status migrainosus: Secondary | ICD-10-CM | POA: Insufficient documentation

## 2010-05-30 DIAGNOSIS — Z9189 Other specified personal risk factors, not elsewhere classified: Secondary | ICD-10-CM | POA: Insufficient documentation

## 2010-05-30 DIAGNOSIS — Z87440 Personal history of urinary (tract) infections: Secondary | ICD-10-CM | POA: Insufficient documentation

## 2010-05-30 HISTORY — DX: Personal history of urinary (tract) infections: Z87.440

## 2010-05-31 ENCOUNTER — Other Ambulatory Visit: Payer: Medicare Other

## 2010-05-31 ENCOUNTER — Ambulatory Visit (INDEPENDENT_AMBULATORY_CARE_PROVIDER_SITE_OTHER): Payer: Medicare Other | Admitting: Internal Medicine

## 2010-05-31 ENCOUNTER — Encounter: Payer: Self-pay | Admitting: Internal Medicine

## 2010-05-31 ENCOUNTER — Other Ambulatory Visit: Payer: Self-pay | Admitting: Internal Medicine

## 2010-05-31 DIAGNOSIS — E785 Hyperlipidemia, unspecified: Secondary | ICD-10-CM

## 2010-05-31 DIAGNOSIS — F411 Generalized anxiety disorder: Secondary | ICD-10-CM

## 2010-05-31 DIAGNOSIS — E119 Type 2 diabetes mellitus without complications: Secondary | ICD-10-CM

## 2010-05-31 DIAGNOSIS — I10 Essential (primary) hypertension: Secondary | ICD-10-CM

## 2010-05-31 DIAGNOSIS — Z79899 Other long term (current) drug therapy: Secondary | ICD-10-CM

## 2010-05-31 LAB — HEPATIC FUNCTION PANEL
ALT: 26 U/L (ref 0–35)
AST: 26 U/L (ref 0–37)
Albumin: 4.1 g/dL (ref 3.5–5.2)
Total Bilirubin: 0.4 mg/dL (ref 0.3–1.2)
Total Protein: 6.2 g/dL (ref 6.0–8.3)

## 2010-05-31 LAB — LIPID PANEL
Cholesterol: 295 mg/dL — ABNORMAL HIGH (ref 0–200)
Total CHOL/HDL Ratio: 6

## 2010-06-05 NOTE — Assessment & Plan Note (Signed)
Summary: new pt to establish/medicare/aarp,#/cd---mailed new pkt   Vital Signs:  Patient profile:   77 year old female Weight:      168.4 pounds (76.55 kg) O2 Sat:      97 % on Room air Temp:     97.9 degrees F (36.61 degrees C) oral Pulse rate:   88 / minute BP sitting:   130 / 84  (left arm) Cuff size:   regular  Vitals Entered By: Orlan Leavens RMA (May 31, 2010 9:46 AM)  O2 Flow:  Room air CC: New patient Is Patient Diabetic? No Pain Assessment Patient in pain? no        Primary Care Provider:  Newt Lukes MD  CC:  New patient.  History of Present Illness: new pt to me and our division, here to est care prev followed with dr. Juleen China - known to our other divisions (pulm, GI)  anxiety and depression -concerned about inc symptoms - onset change past 3 months - feels symptoms precipitated by stress (illness and hosp/rehab of spouse) - hx same - on paxil for years w/o change for depression/anxiety hx - no SI/HI - currently using additional clonazepam for same with good relief but needing higher dose (full tab, not 1/2 as prev rx'd)  DM2 - dx years ago - intol of metformin trial - on actos w/o change since that time. does not check cbgs at home.  dyslipidemia - prev on simva for years- stopped 01/2010 due to generalized myalgias and feeling bad - no significant improvement in symptoms off med and concerned about uncontrolled chol off tx - willing to resume simva if needed  Preventive Screening-Counseling & Management  Alcohol-Tobacco     Alcohol drinks/day: 0     Alcohol Counseling: not indicated; patient does not drink     Smoking Status: quit > 6 months     Year Quit: 1970     Pack years: 2 cigarettes daily x10 years     Tobacco Counseling: not indicated; no tobacco use  Caffeine-Diet-Exercise     Exercise Counseling: to improve exercise regimen  Safety-Violence-Falls     Seat Belt Counseling: not indicated; patient wears seat belts     Helmet Counseling:  not indicated; patient wears helmet when riding bicycle/motocycle     Firearm Counseling: not applicable     Violence Counseling: not indicated; no violence risk noted  Clinical Review Panels:  Prevention   Last Mammogram:  No specific mammographic evidence of malignancy.   (03/25/2008)   Last Pap Smear:  Interpretation Result:Negative for intraepithelial Lesion or Malignancy.    (03/25/2008)   Last Colonoscopy:  DONE (11/14/2009)  Immunizations   Last Flu Vaccine:  Historical (12/23/2009)   Last Pneumovax:  Historical (12/24/2007)  Diabetes Management   Last Dilated Eye Exam:  normal (03/25/2009)   Last Flu Vaccine:  Historical (12/23/2009)   Last Pneumovax:  Historical (12/24/2007)  CBC   WBC:  5.4 (11/03/2009)   RBC:  3.26 (11/03/2009)   Hgb:  10.5 (11/03/2009)   Hct:  31.3 (11/03/2009)   Platelets:  153.0 (11/03/2009)   MCV  95.8 (11/03/2009)   MCHC  33.5 (11/03/2009)   RDW  17.2 (11/03/2009)   PMN:  64.8 (11/03/2009)   Lymphs:  25.0 (11/03/2009)   Monos:  7.9 (11/03/2009)   Eosinophils:  2.0 (11/03/2009)   Basophil:  0.3 (11/03/2009)   Current Medications (verified): 1)  Diovan 160 Mg  Tabs (Valsartan) .... One Tablet Daily. 2)  Multivitamins  Tabs (Multiple Vitamin) .... One Tablet Daily. 3)  Zyrtec Allergy 10 Mg  Tabs (Cetirizine Hcl) .... By Mouth Daily 4)  Singulair 10 Mg  Tabs (Montelukast Sodium) .... One Tablet Daily. 5)  Paxil Cr 25 Mg Xr24h-Tab (Paroxetine Hcl) .Marland Kitchen.. 1 By Mouth Daily 6)  Proair Hfa 108 (90 Base) Mcg/act  Aers (Albuterol Sulfate) .... Inhale 2 Puffs Every 4-6 Hours As Needed. 7)  Skelaxin 800 Mg  Tabs (Metaxalone) .... Take 1/2 As Needed 8)  Actos 30 Mg  Tabs (Pioglitazone Hcl) .Marland Kitchen.. 1 By Mouth Daily 9)  Clonazepam 0.5 Mg  Tabs (Clonazepam) .... 1/2 By Mouth Dailly 10)  Symbicort 160-4.5 Mcg/act  Aero (Budesonide-Formoterol Fumarate) .... Two Puffs Twice Daily Cancel Pulmicort Rx 11)  Fluticasone Propionate 50 Mcg/act Susp (Fluticasone  Propionate) .... Two Sprays Each Nostril Daily 12)  Super Calcium/d 600-125 Mg-Unit Tabs (Calcium-Vitamin D) .... Take Two By Mouth Once Daily 13)  Omeprazole 20 Mg Cpdr (Omeprazole) .... One Tablet By Mouth Once Daily 14)  Iron 325 (65 Fe) Mg Tabs (Ferrous Sulfate) .Marland Kitchen.. 1 By Mouth Two Times A Day 15)  Diphenhydramine Hcl 25 Mg Tabs (Diphenhydramine Hcl) .... Take 1 By Mouth Once Daily 16)  Stool Softener 100 Mg Caps (Docusate Sodium) .... Take 1 By Mouth Once Daily 17)  Vitamin D 1000 Unit Tabs (Cholecalciferol) .... Take 1 By Mouth Once Daily  Allergies (verified): 1)  ! * Morphine Sulfate  Past History:  Past Medical History: CONSTIPATION, CHRONIC ANXIETY FIBROMYALGIA ARTHRITIS HYPERTENSION  HYPERLIPIDEMIA DM2 GERD ALLERGIC RHINITIS  Asthma Hemorrhoids, internal and external Interstitial Cystitis  anemia, iron defic  MD roster: pulm - [facility] - [facility] -Ron Davis,[facility] - [facility] - [facility]- gioffre hand - sypher  Past Surgical History: Cataract Extraction bilateral Total Hip Arthroplasty (bilateral) Hysterectomy Ovarian cyst removed  Lumbar disc surgery  Family History: No FH of Colon Cancer: Family History of Heart Disease: father Family History of Arthritis (parent) Family History High cholesterol (parent, other relative) Family History Hypertension (parent, other relative)  father had alzheimers, died from MI @ 43 in 23-Jul-1994. Had right hip replacement at 88.  Mother is still living age 51 1 sister has high cholestrol & depression  Social History: Former smoker (remote - quit 65s) Married with two children Retired Med [employer] Alcohol Use - no Daily Caffeine Use coffee in AM Illicit Drug Use - no  Review of Systems       see HPI above. I have reviewed all other systems and they were negative.   Physical Exam  General:  alert, well-developed, well-nourished, and cooperative to examination.    Head:  Normocephalic and atraumatic  without obvious abnormalities. No apparent alopecia or balding. Eyes:  vision grossly intact; pupils equal, round and reactive to light.  conjunctiva and lids normal.    Ears:  R ear normal and L ear normal.   Neck:  supple, full ROM, no masses, no thyromegaly; no thyroid nodules or tenderness. no JVD or carotid bruits.   Lungs:  normal respiratory effort, no intercostal retractions or use of accessory muscles; normal breath sounds bilaterally - no crackles and no wheezes.    Heart:  normal rate, regular rhythm, no murmur, and no rub. BLE without edema. Abdomen:  soft, non-tender, normal bowel sounds, no distention; no masses and no appreciable hepatomegaly or splenomegaly.   Genitalia:  defer Msk:  No deformity or scoliosis noted of thoracic or lumbar spine.   Neurologic:  alert & oriented  X3 and cranial nerves II-XII symetrically intact.  strength normal in all extremities, sensation intact to light touch, and gait normal. speech fluent without dysarthria or aphasia; follows commands with good comprehension.  Skin:  no rashes, vesicles, ulcers, or erythema. No nodules or irregularity to palpation.  Psych:  Oriented X3, memory intact for recent and remote, normally interactive, good eye contact,min anxious appearing, min depressed appearing, and not agitated.      Impression & Recommendations:  Problem # 1:  ANXIETY (ICD-300.00) inc symptoms precipitated by illnes of spouse inc SSRI dose now and cont BZ as ongoing - encouraged as needed dosing at lowest poss dose f/u 4-6 wk on same- ?other med tx if not improved Her updated medication list for this problem includes:    Paxil Cr 25 Mg Xr24h-tab (Paroxetine hcl) .Marland Kitchen... 1 by mouth two times a day    Clonazepam 0.5 Mg Tabs (Clonazepam) .Marland Kitchen... 1/2 by mouth dailly  Problem # 2:  DIABETES MELLITUS, TYPE II (ICD-250.00)  does not check home cbg prior tx metformin not well tol (n/v despite 3 wk use) send for prior records to review and check a1c  now Her updated medication list for this problem includes:    Diovan 160 Mg Tabs (Valsartan) ..... One tablet daily.    Actos 30 Mg Tabs (Pioglitazone hcl) .Marland Kitchen... 1 by mouth daily  Orders: TLB-A1C / Hgb A1C (Glycohemoglobin) (83036-A1C) TLB-Creatinine, Blood (82565-CREA)  Last Eye Exam: normal (03/25/2009)  Problem # 3:  HYPERLIPIDEMIA (ICD-272.4) send for prior records and check now with LFTs simva 40mg  stopped 01/2010 due to myalgias but reports symptoms not improved off statin will plan to resume same if FLP shows need for same - pt understands and agrees Orders: TLB-Lipid Panel (80061-LIPID)  Problem # 4:  FIBROMYALGIA (ICD-729.1)  Her updated medication list for this problem includes:    Skelaxin 800 Mg Tabs (Metaxalone) .Marland Kitchen... Take 1/2 as needed  Problem # 5:  HYPERTENSION (ICD-401.9)  Her updated medication list for this problem includes:    Diovan 160 Mg Tabs (Valsartan) ..... One tablet daily.  Orders: TLB-Creatinine, Blood (82565-CREA)  BP today: 130/84 Prior BP: 140/88 (04/16/2010)  Problem # 6:  G E R D (ICD-530.81)  Her updated medication list for this problem includes:    Omeprazole 20 Mg Cpdr (Omeprazole) ..... One tablet by mouth once daily  EGD: DONE (11/14/2009)  Complete Medication List: 1)  Diovan 160 Mg Tabs (Valsartan) .... One tablet daily. 2)  Multivitamins Tabs (Multiple vitamin) .... One tablet daily. 3)  Zyrtec Allergy 10 Mg Tabs (Cetirizine hcl) .... By mouth daily 4)  Singulair 10 Mg Tabs (Montelukast sodium) .... One tablet daily. 5)  Paxil Cr 25 Mg Xr24h-tab (Paroxetine hcl) .Marland Kitchen.. 1 by mouth two times a day 6)  Proair Hfa 108 (90 Base) Mcg/act Aers (Albuterol sulfate) .... Inhale 2 puffs every 4-6 hours as needed. 7)  Skelaxin 800 Mg Tabs (Metaxalone) .... Take 1/2 as needed 8)  Actos 30 Mg Tabs (Pioglitazone hcl) .Marland Kitchen.. 1 by mouth daily 9)  Clonazepam 0.5 Mg Tabs (Clonazepam) .... 1/2 by mouth dailly 10)  Symbicort 160-4.5 Mcg/act Aero  (Budesonide-formoterol fumarate) .... Two puffs twice daily cancel pulmicort rx 11)  Fluticasone Propionate 50 Mcg/act Susp (Fluticasone propionate) .... Two sprays each nostril daily 12)  Super Calcium/d 600-125 Mg-unit Tabs (Calcium-vitamin d) .... Take two by mouth once daily 13)  Omeprazole 20 Mg Cpdr (Omeprazole) .... One tablet by mouth once daily 14)  Iron 325 (65 Fe)  Mg Tabs (Ferrous sulfate) .Marland Kitchen.. 1 by mouth two times a day 15)  Diphenhydramine Hcl 25 Mg Tabs (Diphenhydramine hcl) .... Take 1 by mouth once daily 16)  Stool Softener 100 Mg Caps (Docusate sodium) .... Take 1 by mouth once daily 17)  Vitamin D 1000 Unit Tabs (Cholecalciferol) .... Take 1 by mouth once daily  Other Orders: TLB-Hepatic/Liver Function Pnl (80076-HEPATIC)  Patient Instructions: 1)  it was good to see you today. 2)  medications and history reviewed today 3)  test(s) ordered today - your results will be posted on the phone tree for review in 48-72 hours from the time of test completion; call [phone] and enter your 9 digit MRN (listed above on this page, just below your name); if any changes need to be made or there are abnormal results, you will be contacted directly.  4)  increase paxil to two times a day and continue clonazepam as ongoing 5)  will send for records from dr. Juleen China to review as discussed 6)  Please schedule a follow-up appointment in 4-6 weeks to review depression and anxiety symptoms, call sooner if problems.    Orders Added: 1)  TLB-A1C / Hgb A1C (Glycohemoglobin) [83036-A1C] 2)  TLB-Hepatic/Liver Function Pnl [80076-HEPATIC] 3)  TLB-Lipid Panel [80061-LIPID] 4)  TLB-Creatinine, Blood [82565-CREA] 5)  New Patient Level IV [09811]

## 2010-06-08 LAB — GLUCOSE, CAPILLARY
Glucose-Capillary: 72 mg/dL (ref 70–99)
Glucose-Capillary: 91 mg/dL (ref 70–99)

## 2010-06-12 NOTE — Letter (Signed)
Summary: [facility]  [facility]   Imported By: Sherian Rein 06/07/2010 10:57:35  _____________________________________________________________________  External Attachment:    Type:   Image     Comment:   External Document

## 2010-06-12 NOTE — Letter (Signed)
Summary: Med List & Patient History / Patient  Med List & Patient History / Patient   Imported ByLennie Odor 06/05/2010 16:13:52  _____________________________________________________________________  External Attachment:    Type:   Image     Comment:   External Document

## 2010-06-26 ENCOUNTER — Other Ambulatory Visit: Payer: Self-pay | Admitting: Adult Health

## 2010-06-26 NOTE — Progress Notes (Signed)
Received refill request from [facility] for pt's valacyclovir 1gm.  Pt was first given rx for this med by TP 02/2010 in EMR @ ov.  Per TP, okay to fill x1 but future refills must go to PCP.  This was written on the refill request and faxed back to [facility] on 06-22-10.  rx sent to be scanned into Epic.

## 2010-07-02 ENCOUNTER — Ambulatory Visit (INDEPENDENT_AMBULATORY_CARE_PROVIDER_SITE_OTHER): Payer: Medicare Other | Admitting: Internal Medicine

## 2010-07-02 ENCOUNTER — Encounter: Payer: Self-pay | Admitting: Internal Medicine

## 2010-07-02 DIAGNOSIS — E785 Hyperlipidemia, unspecified: Secondary | ICD-10-CM

## 2010-07-02 DIAGNOSIS — E119 Type 2 diabetes mellitus without complications: Secondary | ICD-10-CM

## 2010-07-02 DIAGNOSIS — F411 Generalized anxiety disorder: Secondary | ICD-10-CM

## 2010-07-02 DIAGNOSIS — N3010 Interstitial cystitis (chronic) without hematuria: Secondary | ICD-10-CM | POA: Insufficient documentation

## 2010-07-02 NOTE — Assessment & Plan Note (Signed)
Resumed simva 05/2010 - The current medical regimen is effective;  continue present plan and medications.

## 2010-07-02 NOTE — Assessment & Plan Note (Signed)
The current medical regimen is effective;  continue present plan and medications. Lab Results  Component Value Date   HGBA1C 6.0 05/31/2010

## 2010-07-02 NOTE — Assessment & Plan Note (Signed)
  inc symptoms precipitated by illnes of spouse The current medical regimen is effective;  continue present plan and medications.  encouraged as needed dosing at lowest poss dose Consider behav health - if not improved

## 2010-07-02 NOTE — Patient Instructions (Addendum)
It was good to see you today. Medications reviewed, no changes at this time. Your depression and anxiety seem appropriate for the situation at hand, let us know if there are any changes. Please schedule followup in 3-4 months for diabetes and medication review, call sooner if problems.

## 2010-07-02 NOTE — Progress Notes (Signed)
  Subjective:    Patient ID: [name], female    DOB: 07/02/1933, 77 y.o.   MRN: [id number]  HPI  Here for followup - reviewed chronic medical issues today:  anxiety and depression -chronic symptoms with increase symptoms 02/2010 precipitated by stress (illness and hosp/rehab of spouse) - on paxil for years w/o change for depression/anxiety hx - no SI/HI - currently using additional clonazepam for same with good relief -recommended increase ssri dose 05/2010 but not needed -   DM2 - prev intol of metformin trial - on actos w/o change since that time. does not check cbgs at home.  dyslipidemia - prev on simva for years- stopped 01/2010 due to generalized myalgias and feeling bad - no significant improvement in symptoms off med and concerned about uncontrolled chol off tx - resumed simva 04/2010 due to uncontrolled lipids - the patient reports compliance with medication(s) as prescribed. Denies adverse side effects.  Past Medical History  Diagnosis Date  . HYPERLIPIDEMIA 08/12/2007  . ANEMIA, IRON DEFICIENCY 11/03/2009  . ANXIETY 08/12/2007  . HYPERTENSION 08/12/2007  . ALLERGIC RHINITIS 08/12/2007  . ASTHMA, EXTRINSIC 12/19/2008  . G E R D 08/12/2007  . ARTHRITIS 08/12/2007  . MIGRAINE HEADACHE 05/30/2010  . EMPHYSEMA 05/30/2010  . FIBROMYALGIA 08/12/2007    fibromyalgia  . DIABETES MELLITUS, TYPE II 05/31/2010  . Interstitial cystitis    Review of Systems  Constitutional: Positive for fatigue. Negative for fever and unexpected weight change.  Psychiatric/Behavioral: Negative for suicidal ideas, hallucinations, behavioral problems, sleep disturbance, dysphoric mood and decreased concentration. The patient is not nervous/anxious.        Objective:   Physical Exam  Constitutional: She appears well-developed and well-nourished. No distress.  Cardiovascular: Normal rate, regular rhythm and normal heart sounds.  Exam reveals no friction rub.   No murmur heard. Pulmonary/Chest: Effort normal  and breath sounds normal. No respiratory distress. She has no wheezes. She exhibits no tenderness.  Psychiatric: She has a normal mood and affect. Her behavior is normal. Judgment and thought content normal.   BP 132/72  Pulse 86  Temp(Src) 97.4 F (36.3 C) (Oral)  Ht 5\' 5"  (1.651 m)  Wt 167 lb (75.751 kg)  BMI 27.79 kg/m2  SpO2 96%      Assessment & Plan:  See problem list. Medications and labs reviewed today. Time spent with the patient 25 minutes, greater than 50% time spent counseling patient on diabetes, cholesterol, depression and medication review. Also review of prior records

## 2010-07-17 ENCOUNTER — Other Ambulatory Visit: Payer: Self-pay | Admitting: Critical Care Medicine

## 2010-08-10 NOTE — Op Note (Signed)
[name], [name]              ACCOUNT NO.:  [id number]   MEDICAL RECORD NO.:  [id number]          PATIENT TYPE:  INP   LOCATION:  X005                         FACILITY:  [facility]   PHYSICIAN:  Georges Lynch. Gioffre, M.D.DATE OF BIRTH:  [birth year]   DATE OF PROCEDURE:  04/12/2005  DATE OF DISCHARGE:                                 OPERATIVE REPORT   SURGEON:  Dr. Darrelyn Hillock.   ASSISTANT:  Dr. Marlowe Kays.   PREOPERATIVE DIAGNOSES:  1.  Severe spinal stenosis at L3-4.  2.  Large herniated lumbar disk at L3-4 on the left. She had strictly severe      left lower extremity pain preop.   POSTOPERATIVE DIAGNOSES:  1.  Severe spinal stenosis at L3-4.  2.  Large herniated lumbar disk at L3-4 on the left. She had strictly severe      left lower extremity pain preop.   OPERATION:  1.  Complete decompressive lumbar laminectomy at L3-4.  2.  Exploration of two nerve roots, the L3 root and the L4 root.  3.  Microdiskectomy at L3-4 on the left and foraminotomies.   DESCRIPTION OF PROCEDURE:  Under general anesthesia, routine orthopedic prep  and draping of the lower back was carried out. The patient was placed in a  spinal frame with great care taken not to injure the total hips. At this  time, we went ahead and put two needles in the back, x-ray was taken for  verification of our level. We then made an incision over the L3-4, 4-5  space. Bleeders identified and cauterized. The muscle was taken down on both  sides. We then inserted self retaining retractor and another x-ray was taken  to verify our position. We then removed the spinous processes of L3 and L4  two levels. We then went down and did a complete decompressive lumbar  laminectomy at L3-L4. We went down distally and proximally. We then removed  the ligamentum flavum. We decompressed the central canal quite nicely. The  microscope was used during the procedure. Following that, we went out  laterally and decompressed the lateral  recess, cauterized the lateral recess  veins. We identified the dura and the L3 root and the L4 root. We then  gently retracted the dura with the D'Errico retractor. We protected the dura  with cottonoid. At this time, I made a cruciate incision in the disk space,  went down into the L3-4 space and did a diskectomy. Note when one reviewed  the myelograms preop and the CAT scan, we noted that disk fragment migrated  proximally so we went up proximal, isolated the L3 root and there was a  large disk herniation up under the L3 root. We gently teased that out with  the nerve hook, removed all the fragments, the root now was completely free.  We were able to easily pass the hockey stick out the foramina for the 3 root  and the 4 root as well. We decompressed the recess quite nicely. We made  multiple passages into the disk space and also made sure that the dura and  roots were  free. We thoroughly irrigated out the area, removed all the  fluid, loosely applied some thrombin-soaked Gelfoam and closed the wound in  layers in the usual fashion except I left the deep proximal part of the  wound open for drainage purposes. Sterile dressings were applied after we  closed the skin. Prior to closing the  skin, I injected 20 mL of 0.5% Marcaine with epinephrine into the  surrounding muscle. The patient left the operating room in satisfactory  condition. She had 1 gram of IV Ancef preop. At the end of the case, we gave  her 30 mg of IV Toradol.           ______________________________  Georges Lynch. Darrelyn Hillock, M.D.     RAG/MEDQ  D:  04/12/2005  T:  04/12/2005  Job:  [id number]

## 2010-08-10 NOTE — H&P (Signed)
NAMEELVETA, RAPE              ACCOUNT NO.:  [id number]   MEDICAL RECORD NO.:  [id number]          PATIENT TYPE:  INP   LOCATION:  NA                           FACILITY:  [facility]   PHYSICIAN:  Georges Lynch. Gioffre, M.D.DATE OF BIRTH:  Mar 06, 1934   DATE OF ADMISSION:  04/12/2005  DATE OF DISCHARGE:                                HISTORY & PHYSICAL   CHIEF COMPLAINT:  Lower back and left leg pain.   HISTORY OF PRESENT ILLNESS:  Patient is a 77 year old female who has been  having severe lower back and left leg pain who was seen in the office by Dr.  Darrelyn Hillock and myself.  Patient's evaluation revealed severe spinal stenosis  and a large herniated disc on the left at L3-4.  Patient is significantly  symptomatic in this fashion, so Dr. Darrelyn Hillock and the patient discussed  surgical treatment and the patient would like to proceed with a central  decompression at L3-4 with left L3-4 HNP on microdiscectomy.   ALLERGIES:  NO KNOWN DRUG ALLERGIES.   CURRENT MEDICATIONS:  1.  Mobic 15 mg a day.  2.  Diovan 80 mg a day.  3.  Clarinex 5 mg a day.  4.  Singulair 10 mg a day.  5.  Paxil 5 mg a day.  6.  Tylenol arthritis four times a day.  7.  Advair Diskus 100/50 two puffs a day.  8.  Rhinocort Aqua nasal spray two times a day.  9.  Albuterol inhaler p.r.n.  10. Skelaxin 400 mg a day p.r.n.  11. Mepergan Fortis one tablet every four hours at night for pain.  12. Valium 5 mg q.12h. p.r.n. spasm.  13. Percocet one or two tablets every six hours during the day for pain.   PRESENT MEDICAL HISTORY:  1.  Hypertension.  2.  Migraines.  3.  Frequent bronchitis.  4.  Occasional reflux.   PAST SURGICAL HISTORY:  1.  Hysterectomy.  2.  Right total hip arthroplasty.  3.  Left total hip arthroplasty.  4.  Cataract removal with lens implants.  5.  Patient denies any complications.   SOCIAL HISTORY:  Patient denies any alcohol.  She has not smoked since 77  years of age.   PRIMARY CARE PHYSICIAN:   Adela Lank, M.D.   REVIEW OF SYSTEMS:  Positive for migraines but have not recurred since being  on blood pressure medicines.  She does have a history of cataracts but  presently has lens implants.  She has had significant issues in the past  with bronchitis but it has not recurred in several years since being on the  above mentioned respiratory medications.  She did have a cardiac work-up in  2000 without any significant cardiac disease other than slight electrical  variant.  She does have occasional reflux for which she uses over-the-  counter Zantac.  She did have a recent colonoscopy which was clear and no  follow-up for seven years.  She does have easy bruising and bleeding.  She  indicates that a lab tech has checked her and she has some type of factor  K  deficiency.   PHYSICAL EXAMINATION:  GENERAL APPEARANCE:  Patient is conscious, alert and  appropriate 77 year old female.  She does ambulate with the use of a walker.  She does ambulate with a slight stooped forward position.  She gets herself  on and off the exam table.  VITAL SIGNS:  Height is 5 feet 5 inches, weight is 157, blood pressure  138/88, pulse 80 and regular, respirations 12.  Patient was afebrile.  HEENT:  Head was normocephalic.  Pupils are equal, round and reactive.  Gross hearing is intact.  NECK:  Supple.  No palpable lymphadenopathy.  Good range of motion.  CHEST:  Lung sounds clear and equal bilaterally.  CARDIOVASCULAR:  Regular rate and rhythm.  ABDOMEN:  Soft and nontender.  EXTREMITIES:  Upper extremities were symmetrically sized and shaped.  She  had good range of motion of her shoulders, elbows and wrists.  Lower  extremities:  Right and left hip had full extension, flexion up to 110  degrees, 20 degrees internal and external rotation.  Both knees were normal-  appearing without any discomfort.  No erythema or ecchymosis.  She had full  extension.  Flexed them back to 100% easily.  Calves were soft  and  nontender.  Ankles had good dorsi- and plantar flexion.  NEUROLOGIC:  Patient was conscious, alert and appropriate.  Easy  conversation with examiner.  Cranial nerves II-XII grossly intact.  Bilateral lower extremities had normal sensation.  Good motor strength at  great toe extension, ankle flexion, knee extension but she was about a 3/5  on hip flexor on the left and 5/5 on the right.  PERIPHERAL VASCULAR:  Carotid pulses were 2+, no bruits.  Radial pulses were  2+.  Dorsalis pedis pulses were 2+.  She had a few __________ varicosities  in the lower extremity.  BREAST/RECTAL/GU:  Examinations were deferred at this time.   IMPRESSION:  1.  Spinal stenosis at L3-4 with large herniated nucleus pulposus on the      left.  2.  History of hypertension.  3.  History of migraines.  4.  History of frequent bronchitis.  5.  History of reflux.  6.  History of easy bruising with factor K deficiency.   PLAN:  Patient will undergo routine labs and tests prior to having a central  decompression at L3-4 and a microdiscectomy on L3-4 on the left with Dr.  Darrelyn Hillock at [facility] on April 12, 2005.      Jamelle Rushing, P.A.    ______________________________  Georges Lynch Darrelyn Hillock, M.D.    RWK/MEDQ  D:  04/11/2005  T:  04/11/2005  Job:  [id number]

## 2010-08-10 NOTE — Assessment & Plan Note (Signed)
[facility]                         GASTROENTEROLOGY OFFICE NOTE   NAME:Facer, MAEZIE JUSTIN                     MRN:          [id number]  DATE:06/04/2006                            DOB:          1933/05/12    Mrs. Manter notes worsening problems with belching, regurgitation,  intermittent solid food dysphagia, and a tightness in her throat and  upper chest. These symptoms are related to swallowing and reflux. The  symptoms are not related to exertion or associated with shortness of  breath. The pain and tightness do not radiate. She has a history of GERD  and underwent endoscopy in May 2005 which showed gastritis and GERD. She  was treated with Protonix for a short period of time and her symptoms  resolved. Over the past 2-3 months, all of the above symptoms have  recurred. She has tried over-the-counter Zantac which has substantially  improved her symptoms. Her weight is stable and her appetite is good.  She notes no odynophagia.   MEDICATIONS:  Listed on the chart, updated and reviewed.   ALLERGIES:  MORPHINE   PHYSICAL EXAMINATION:  GENERAL: No acute distress.  VITAL SIGNS: Weight 155.8, blood pressure 140/88, pulse 72 and regular.  HEENT: Anicteric sclerae, oral pharynx clear.  CHEST: Clear to auscultation bilaterally.  CARDIAC: Regular rate and rhythm without murmurs appreciated.  ABDOMEN: Soft and nontender with normal active bowel sounds. No palpable  organomegaly, masses, or hernias.   ASSESSMENT AND PLAN:  Presumed GERD. Resume standard anti-reflux  measures and begin Omeprazole 20 mg p.o. q.a.m. for the next 6-8 weeks.  If her symptoms do not completely resolve within the next 6-8 weeks, she  is to return for further follow up. If her symptoms completely resolve,  she may use Omeprazole on demand.     Venita Lick. Russella Dar, MD, [facility]  Electronically Signed    MTS/MedQ  DD: 06/04/2006  DT: 06/06/2006  Job #: [id number]

## 2010-08-10 NOTE — Discharge Summary (Signed)
[name], [name]              ACCOUNT NO.:  [id number]   MEDICAL RECORD NO.:  [id number]          PATIENT TYPE:  INP   LOCATION:  1503                         FACILITY:  [facility]   PHYSICIAN:  Georges Lynch. Gioffre, M.D.DATE OF BIRTH:  [birth year]   DATE OF ADMISSION:  04/12/2005  DATE OF DISCHARGE:  04/14/2005                                 DISCHARGE SUMMARY   ADMISSION DIAGNOSES:  1.  Spinal stenosis at lumbar verterbrae-3/4 with a large herniated disc on      the left.  2.  History of hypertension.  3.  History of migraines.  4.  History of bronchitis.  5.  History of reflux.  6.  History of easy bruising with a factor K deficiency.   DISCHARGE DIAGNOSES:  1.  Central decompression at lumbar vertebrae-3/4 for stenosis with a lumbar      vertebrae-3/4 microdiskectomy.  2.  Hypertension.  3.  History of migraines.  4.  History of bronchitis.  5.  History of reflux.  6.  History of easy bruising with factor K deficiency.   HISTORY OF PRESENT ILLNESS:  The patient is a 77 year old female who  presented with Dr. Georges Lynch. Gioffre with severe lower back pain and left  leg pain. Evaluation revealed severe spinal stenosis with a large herniated  disc on the left at L3/4. The patient was received with a central  decompression at L3/4 with a left L3/4 microdiskectomy.   ALLERGIES:  No known drug allergies.   MEDICATIONS:  1.  Mobic 15 milligrams a day.  2.  Diovan 80 milligrams a day.  3.  Clarinex 5 milligrams a day.  4.  Singulair 10 milligrams a day.  5.  Paxil 5 milligrams a day.  6.  Tylenol arthritis four times a day p.r.n.  7.  Advair discus 100/50 2 puffs a day.  8.  Rhinocort Aqua Nasal Spray 2 times a day.  9.  Albuterol inhaler p.r.n.  10. Skelaxin 400 milligrams daily p.r.n.  11. Mepergan fortis 1 tablet q.4h. at night for pain.  12. Valium 5 milligrams q.12h. p.r.n. spasm.  13. Percocet 1-2 tablets every six hours during the day for pain.   PROCEDURE:  On  April 12, 2005, the patient was taken to the OR by Dr.  Georges Lynch. Gioffre, assisted by Dr. Marlowe Kays. Under general  anesthesia, the patient underwent central decompression at L3/4 for a spinal  stenosis and a left 3/4 microdiskectomy. No complications. The patient was  transferred to the recovery room and then to the orthopedic floor in good  condition.   CONSULTATIONS:  The following routine consults requested:  Occupational,  physical therapy, case management.   HOSPITAL COURSE:  On April 12, 2005, the patient was admitted to [facility] under the care of Dr. Windy Fast A. Gioffre. The patient was taken  to the OR where a central decompression at L3/4 with a left L3/4  microdiskectomy was performed without any complications. The patient was  transferred to the recovery room and then the orthopedic floor in good  condition where the patient spent two days  postoperative care on the  orthopedic floor. The patient did very well during her postoperative course.  She was able to transition from IV pain medications to p.o. medications  well. She tolerated her IV antibiotics in the first 24 hours. The patient's  wound remained benign for any signs of infections. Her legs remained  neuromotor vascularly intact and she has minimal lower back or leg pain.   On postoperative day two, the patient's vital signs were stable. She was  neuromotor vascularly intact and ready and eager to go home for routine  outpatient care. The patient was discharged in good condition.   LABORATORY DATA:  CBC on admission showed a WBC 5.6, hemoglobin 13.4,  hematocrit 39.6, platelets of 176,000. H&H on April 13, 2004 showed  hemoglobin 11.4, hematocrit 33.0. Routine chemistries on admission were  normal with the exception of glucose at 158 and urinalysis on admission was  normal with the exception of some small leukocyte esterase.   DIET:  No restrictions.   ACTIVITY:  The patient is to follow  the restrictions on the printed  laminectomy discharge sheet and ambulate with the use of crutches.   WOUND CARE:  The patient is to change the dressing daily and keep it clean  and dry.   DISCHARGE MEDICATIONS:  The patient is to resume routine home medications  with the addition of Percocet 1-2 tablets every four to six hours for pain,  Robaxin 500 milligrams p.o. q.6h. p.r.n. spasm.   FOLLOW UP:  The patient is to follow up with Dr. Georges Lynch. Gioffre in two  weeks from discharge in his office. The patient is to call for an  appointment.   CONDITION ON DISCHARGE:  Listed as improved.      Jamelle Rushing, P.A.    ______________________________  Georges Lynch Darrelyn Hillock, M.D.    RWK/MEDQ  D:  04/23/2005  T:  04/24/2005  Job:  [id number]

## 2010-08-21 ENCOUNTER — Other Ambulatory Visit: Payer: Self-pay | Admitting: Oncology

## 2010-08-21 ENCOUNTER — Encounter (HOSPITAL_BASED_OUTPATIENT_CLINIC_OR_DEPARTMENT_OTHER): Payer: Medicare Other | Admitting: Oncology

## 2010-08-21 DIAGNOSIS — D649 Anemia, unspecified: Secondary | ICD-10-CM

## 2010-08-21 LAB — CBC WITH DIFFERENTIAL/PLATELET
Basophils Absolute: 0 10*3/uL (ref 0.0–0.1)
Eosinophils Absolute: 0.1 10*3/uL (ref 0.0–0.5)
HGB: 12.5 g/dL (ref 11.6–15.9)
LYMPH%: 28.8 % (ref 14.0–49.7)
MCV: 92.3 fL (ref 79.5–101.0)
MONO#: 0.4 10*3/uL (ref 0.1–0.9)
NEUT#: 2.4 10*3/uL (ref 1.5–6.5)
Platelets: 158 10*3/uL (ref 145–400)
RBC: 3.97 10*6/uL (ref 3.70–5.45)
WBC: 4.2 10*3/uL (ref 3.9–10.3)

## 2010-08-21 LAB — IRON AND TIBC
%SAT: 17 % — ABNORMAL LOW (ref 20–55)
Iron: 50 ug/dL (ref 42–145)
TIBC: 302 ug/dL (ref 250–470)

## 2010-08-21 LAB — FERRITIN: Ferritin: 79 ng/mL (ref 10–291)

## 2010-09-04 ENCOUNTER — Encounter: Payer: Self-pay | Admitting: Critical Care Medicine

## 2010-09-04 ENCOUNTER — Ambulatory Visit (INDEPENDENT_AMBULATORY_CARE_PROVIDER_SITE_OTHER): Payer: Medicare Other | Admitting: Critical Care Medicine

## 2010-09-04 DIAGNOSIS — J45909 Unspecified asthma, uncomplicated: Secondary | ICD-10-CM

## 2010-09-04 NOTE — Progress Notes (Signed)
Subjective:    Patient ID: [name], female    DOB: [birth year], 77 y.o.   MRN: [id number]  HPI 77 y.o.    female who presents with asthma with reflux disease and mild sinusitis as triggers for the patient's asthma. The patient had received immunotherapy in the past without improvement. She had did have positive skin testing. However, her IgE level obtained at the last visit was only 4.5. Sinus CT scan showed only mild sinusitis without overt infection. She is not a Xolair candidate due to the low IgE levels.  October 13, 2009 10:03 AM  [name] outside in heat and did water aerobics and now worse x one week.  Symptoms : tight, green, cough, hoarse, raw in throat, nose is ok, feels tired, pt is more dyspneic.  Nares are draining. Sinuses have some pressure. Pt feels very fatigued. ? of anemia.  At last ov pt was to try pulmicort and stop symbicort, she has not yet done this  November 03, 2009 2:20 PM  Pt took pred and abx for sinusitis. Now is better but is having reflux. Has Fe and this causes burping and belching. The patient notes now is less dyspneic. There is no cough or wheezing. There is no chest pain.  March 05, 2010 -Presents for an acute office visit. Complains of prod cough with small amount of yellow mucus, wheezing, hoarseness, some increased SOB x3-4 days. She is under enormous stress. 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 "do not have time to be sick". Also has cold sore for last 1 week on top lips. Denies chest pain, , orthopnea, hemoptysis, fever, n/v/d, edema, headache,recent travel or antibiotics.  March 13, 2010--Presents for follow up. Tx last week for bronchitis w/ zpack and prednisone taper. Returns she is improved however not completely well yet. Complains she is still having tightness in chest, dry cough, chest congestion - states does feel better than last ov. No discolored mucus or fever. just not back to baseline. Husband going for Bypass surgery 03/30/10 , needs  to be well. Denies chest pain, orthopnea, hemoptysis, fever, n/v/d, headache.   April 16, 2010 9:44 AM  Had two OVs for bronchitis and sinusitis in 12/11. Uses nedi pot. Not coughing now. Now no wheeze. No real chest pain. Has occ chest tightness. Pt overall now is better. No other new issues.  09/04/2010 Here for 61month checkup , ok until got a new dog and has some pn drip and congestion.  Odor issues.   No sneezing or itching.  Notes sl chest tightness.   PUL ASTHMA HISTORY 09/04/2010  Symptoms 0-2 days/week  Nighttime awakenings 0-2/month  Interference with activity No limitations  SABA use 0-2 days/wk  Exacerbations requiring oral steroids 0-1 / year   PFT Conversion 08/13/2007  FVC 2.68  FVC PREDICT 2.94  FVC  % Predicted 91  FEV1 2.05  FEV1 PREDICT 2.20  FEV % Predicted 93  FEV1/FVC 76.5  FEV1/FVC PRE 77  FeF 25-75 1.72  FeF 25-75 % Predicted 1.94  FEF % EXPEC 88  PFT RSLT normal   Review of Systems Constitutional:   No  weight loss, night sweats,  Fevers, chills, fatigue, lassitude. HEENT:   No headaches,  Difficulty swallowing,  Tooth/dental problems,  Sore throat,                No sneezing, itching, ear ache, nasal congestion, post nasal drip,   CV:  No chest pain,  Orthopnea, PND, swelling in lower extremities,  anasarca, dizziness, palpitations  GI  No heartburn, indigestion, abdominal pain, nausea, vomiting, diarrhea, change in bowel habits, loss of appetite  Resp: No shortness of breath with exertion or at rest.  No excess mucus, no productive cough,  No non-productive cough,  No coughing up of blood.  No change in color of mucus.  No wheezing.  No chest wall deformity  Skin: no rash or lesions.  GU: no dysuria, change in color of urine, no urgency or frequency.  No flank pain.  MS:  No joint pain or swelling.  No decreased range of motion.  No back pain.  Psych:  No change in mood or affect. No depression or anxiety.  No memory loss.     Objective:    Physical Exam Filed Vitals:   09/04/10 1409  BP: 124/66  Pulse: 82  Temp: 97.9 F (36.6 C)  TempSrc: Oral  Height: 5' 5.5" (1.664 m)  Weight: 169 lb 9.6 oz (76.93 kg)  SpO2: 94%    Gen: Pleasant, well-nourished, in no distress,  normal affect  ENT: No lesions,  mouth clear,  oropharynx clear, no postnasal drip  Neck: No JVD, no TMG, no carotid bruits  Lungs: No use of accessory muscles, no dullness to percussion, clear without rales or rhonchi  Cardiovascular: RRR, heart sounds normal, no murmur or gallops, no peripheral edema  Abdomen: soft and NT, no HSM,  BS normal  Musculoskeletal: No deformities, no cyanosis or clubbing  Neuro: alert, non focal  Skin: Warm, no lesions or rashes        Assessment & Plan:   ASTHMA, EXTRINSIC Stable moderate persistent asthma controlled with singulair and nasal steroid,  No need for ICS Plan No change in inhaled or maintenance medications. Return in  6 months    Updated Medication List Outpatient Encounter Prescriptions as of 09/04/2010  Medication Sig Dispense Refill  . albuterol (PROAIR HFA) 108 (90 BASE) MCG/ACT inhaler Inhale 2 puffs into the lungs every 6 (six) hours as needed.        . budesonide-formoterol (SYMBICORT) 160-4.5 MCG/ACT inhaler Inhale 2 puffs into the lungs 2 (two) times daily.        . Calcium-Vitamin D (SUPER CALCIUM/D) 600-125 MG-UNIT TABS Take by mouth 2 (two) times daily.        . cetirizine (ZYRTEC) 10 MG tablet Take 10 mg by mouth daily.        . Cholecalciferol (VITAMIN D3) 1000 UNITS CAPS Take by mouth daily.        . clonazePAM (KLONOPIN) 0.5 MG tablet Take 0.5 mg by mouth daily as needed.        Tery Sanfilippo Sodium (STOOL SOFTENER) 100 MG capsule Take 100 mg by mouth daily.        . ferrous sulfate (IRON SUPPLEMENT) 325 (65 FE) MG tablet Take 325 mg by mouth daily.       . fluticasone (FLONASE) 50 MCG/ACT nasal spray 2 sprays by Nasal route daily.        . metaxalone (SKELAXIN) 800 MG tablet Take  800 mg by mouth daily as needed.        . Multiple Vitamin (MULTIVITAMIN) tablet Take 1 tablet by mouth daily.        Marland Kitchen omeprazole (PRILOSEC) 20 MG capsule Take 20 mg by mouth daily.        Marland Kitchen PARoxetine (PAXIL-CR) 25 MG 24 hr tablet Take 25 mg by mouth 2 (two) times daily.        Marland Kitchen  pioglitazone (ACTOS) 30 MG tablet Take 30 mg by mouth daily.        Marland Kitchen SINGULAIR 10 MG tablet TAKE 1 TABLET BY MOUTH ONCE DAILY  90 tablet  6  . valsartan (DIOVAN) 160 MG tablet Take 160 mg by mouth daily.        Marland Kitchen DISCONTD: simvastatin (ZOCOR) 40 MG tablet Take 40 mg by mouth at bedtime.        Marland Kitchen DISCONTD: valACYclovir (VALTREX) 1000 MG tablet Take 2 tablets (2,000 mg total) by mouth 2 (two) times daily.  4 tablet  0

## 2010-09-04 NOTE — Patient Instructions (Signed)
No change in medications. Return in        6 months        

## 2010-09-05 NOTE — Assessment & Plan Note (Signed)
Stable moderate persistent asthma controlled with singulair and nasal steroid,  No need for ICS Plan No change in inhaled or maintenance medications. Return in  6 months

## 2010-09-10 ENCOUNTER — Encounter: Payer: Self-pay | Admitting: Internal Medicine

## 2010-09-10 ENCOUNTER — Ambulatory Visit (INDEPENDENT_AMBULATORY_CARE_PROVIDER_SITE_OTHER): Payer: Medicare Other | Admitting: Internal Medicine

## 2010-09-10 DIAGNOSIS — IMO0002 Reserved for concepts with insufficient information to code with codable children: Secondary | ICD-10-CM

## 2010-09-10 DIAGNOSIS — E785 Hyperlipidemia, unspecified: Secondary | ICD-10-CM

## 2010-09-10 DIAGNOSIS — F411 Generalized anxiety disorder: Secondary | ICD-10-CM

## 2010-09-10 DIAGNOSIS — E119 Type 2 diabetes mellitus without complications: Secondary | ICD-10-CM

## 2010-09-10 DIAGNOSIS — S86899A Other injury of other muscle(s) and tendon(s) at lower leg level, unspecified leg, initial encounter: Secondary | ICD-10-CM

## 2010-09-10 MED ORDER — PAROXETINE HCL 20 MG PO TABS: 20.0000 mg | ORAL_TABLET | Freq: Two times a day (BID) | ORAL | Status: DC

## 2010-09-10 MED ORDER — NAPROXEN 500 MG PO TABS: 500.0000 mg | ORAL_TABLET | Freq: Two times a day (BID) | ORAL | Status: DC

## 2010-09-10 NOTE — Assessment & Plan Note (Signed)
  inc symptoms precipitated by illnes of spouse - increased dose paxil cr to bid spring 2012 and doing well Change SSRI to short acting form, slightly lower dose

## 2010-09-10 NOTE — Assessment & Plan Note (Signed)
Resumed simva 05/2010 - caused leg pains Will change to crestor 20 every other day or as tolerated - Recheck lipids 10/2010

## 2010-09-10 NOTE — Progress Notes (Signed)
  Subjective:    Patient ID: [name], female    DOB: Jul 04, 1933, 77 y.o.   MRN: [id number]  HPI Here for follow up - reviewed chronic medical issues:  anxiety and depression - inc symptoms spring 2012 precipitated by stress (illness and hosp/rehab of spouse) - hx same - on paxil for years with increase dose 06/2010 - improved but ?alt med for cost- no SI/HI - also using additional clonazepam for same with good relief - dose increase 06/2010  DM2 - dx years ago - intol of metformin trial - on actos without change since that time. does not check cbgs at home.  dyslipidemia - prev on simva for years- stopped 01/2010 due to generalized myalgias and feeling bad - no significant improvement in symptoms off med and concerned about uncontrolled chol off tx - tried to resume 06/2010 but recurrent myalgia so no tx in past months - ?crestor trial at qod dosing?  Past Medical History  Diagnosis Date  . ARTHRITIS     s/p bilateral THR  . MIGRAINE HEADACHE   . Interstitial cystitis   . ALLERGIC RHINITIS   . ANEMIA, IRON DEFICIENCY   . ANXIETY   . ASTHMA, EXTRINSIC   . EMPHYSEMA   . HYPERTENSION   . HYPERLIPIDEMIA   . G E R D   . DIABETES MELLITUS, TYPE II   . FIBROMYALGIA     fibromyalgia  . Chronic constipation       Review of Systems  Respiratory: Negative for shortness of breath.   Cardiovascular: Negative for chest pain.  Musculoskeletal:       Complains of left shin pain x 2 weeks, no trauma but +overuse  Neurological: Negative for weakness and headaches.       Objective:   Physical Exam BP 130/80  Pulse 70  Temp(Src) 97 F (36.1 C) (Oral)  Ht 5\' 5"  (1.651 m)  Wt 169 lb (76.658 kg)  BMI 28.12 kg/m2  SpO2 97% Physical Exam  Constitutional: She is oriented to person, place, and time. She appears well-developed and well-nourished. No distress. Cardiovascular: Normal rate, regular rhythm and normal heart sounds.  No murmur heard. No BLE edema. Pulmonary/Chest: Effort  normal and breath sounds normal. No respiratory distress. She has no wheezes.  Musculoskeletal: tender to palpation over left sinh, painful dorsiflexion of foot but FROM. Normal range of motion, no joint effusions. No gross deformities Neurological: She is alert and oriented to person, place, and time. No cranial nerve deficit. Coordination normal.  Skin: Skin is warm and dry. No rash noted. No erythema or bruising.  Psychiatric: She has a normal mood and affect. Her behavior is normal. Judgment and thought content normal.   Lab Results  Component Value Date   WBC 5.4 11/03/2009   HGB 12.5 08/21/2010   HCT 36.7 08/21/2010   PLT 158 08/21/2010   CHOL 295* 05/31/2010   TRIG 247.0* 05/31/2010   HDL 52.60 05/31/2010   LDLDIRECT 213.1 05/31/2010   ALT 26 05/31/2010   AST 26 05/31/2010   NA 139 05/21/2010   NA 139 05/21/2010   K 3.8 05/21/2010   K 3.8 05/21/2010   CL 102 05/21/2010   CL 102 05/21/2010   CREATININE 0.7 05/31/2010   BUN 13 05/21/2010   BUN 13 05/21/2010   CO2 27 05/21/2010   CO2 27 05/21/2010   HGBA1C 6.0 05/31/2010        Assessment & Plan:  See problem list. Medications and labs reviewed today.

## 2010-09-10 NOTE — Patient Instructions (Signed)
It was good to see you today. Use crestor in place of simvastatin, change paxil to short acting and use naprosyn for right shin splint pain - Your prescription(s) have been submitted to your pharmacy. Please take as directed and contact our office if you believe you are having problem(s) with the medication(s). Wear supportive shoes and avoid running/straining as discussed Please schedule followup in 2 months for physical, call sooner if problems.

## 2010-09-11 ENCOUNTER — Encounter: Payer: Self-pay | Admitting: Internal Medicine

## 2010-09-11 NOTE — Assessment & Plan Note (Signed)
Intolerant of metformin due to diarrhea Cont actos - The current medical regimen is effective;  continue present plan and medications.  Lab Results  Component Value Date   HGBA1C 6.0 05/31/2010

## 2010-10-29 ENCOUNTER — Other Ambulatory Visit: Payer: Self-pay | Admitting: Critical Care Medicine

## 2010-11-08 ENCOUNTER — Other Ambulatory Visit: Payer: Medicare Other

## 2010-11-13 ENCOUNTER — Other Ambulatory Visit: Payer: Self-pay | Admitting: Internal Medicine

## 2010-11-13 ENCOUNTER — Other Ambulatory Visit (INDEPENDENT_AMBULATORY_CARE_PROVIDER_SITE_OTHER): Payer: Medicare Other

## 2010-11-13 ENCOUNTER — Encounter: Payer: Self-pay | Admitting: Internal Medicine

## 2010-11-13 ENCOUNTER — Ambulatory Visit (INDEPENDENT_AMBULATORY_CARE_PROVIDER_SITE_OTHER): Payer: Medicare Other | Admitting: Internal Medicine

## 2010-11-13 VITALS — BP 112/78 | HR 68 | Temp 97.7°F | Ht 65.0 in | Wt 168.0 lb

## 2010-11-13 DIAGNOSIS — Z136 Encounter for screening for cardiovascular disorders: Secondary | ICD-10-CM

## 2010-11-13 DIAGNOSIS — E785 Hyperlipidemia, unspecified: Secondary | ICD-10-CM

## 2010-11-13 DIAGNOSIS — I447 Left bundle-branch block, unspecified: Secondary | ICD-10-CM

## 2010-11-13 DIAGNOSIS — R5383 Other fatigue: Secondary | ICD-10-CM

## 2010-11-13 DIAGNOSIS — E119 Type 2 diabetes mellitus without complications: Secondary | ICD-10-CM

## 2010-11-13 DIAGNOSIS — Z23 Encounter for immunization: Secondary | ICD-10-CM

## 2010-11-13 DIAGNOSIS — Z0000 Encounter for general adult medical examination without abnormal findings: Secondary | ICD-10-CM

## 2010-11-13 DIAGNOSIS — R5381 Other malaise: Secondary | ICD-10-CM

## 2010-11-13 DIAGNOSIS — F411 Generalized anxiety disorder: Secondary | ICD-10-CM

## 2010-11-13 HISTORY — DX: Left bundle-branch block, unspecified: I44.7

## 2010-11-13 LAB — BASIC METABOLIC PANEL
BUN: 24 mg/dL — ABNORMAL HIGH (ref 6–23)
Calcium: 9.5 mg/dL (ref 8.4–10.5)
GFR: 60.13 mL/min (ref >60.00)
Glucose, Bld: 85 mg/dL (ref 70–99)

## 2010-11-13 LAB — HEPATIC FUNCTION PANEL
AST: 20 U/L (ref 0–37)
Alkaline Phosphatase: 55 U/L (ref 39–117)
Total Bilirubin: 0.5 mg/dL (ref 0.3–1.2)

## 2010-11-13 LAB — CBC WITH DIFFERENTIAL/PLATELET
Basophils Absolute: 0 10*3/uL (ref 0.0–0.1)
Eosinophils Absolute: 0.2 10*3/uL (ref 0.0–0.7)
Lymphocytes Relative: 26.6 % (ref 12.0–46.0)
Lymphs Abs: 1.5 10*3/uL (ref 0.7–4.0)
Monocytes Relative: 9.5 % (ref 3.0–12.0)
Platelets: 149 10*3/uL — ABNORMAL LOW (ref 150.0–400.0)
RDW: 15.4 % — ABNORMAL HIGH (ref 11.5–14.6)

## 2010-11-13 LAB — LIPID PANEL
Cholesterol: 215 mg/dL — ABNORMAL HIGH (ref 0–200)
Total CHOL/HDL Ratio: 4
VLDL: 27.4 mg/dL (ref 0.0–40.0)

## 2010-11-13 LAB — LDL CHOLESTEROL, DIRECT: Direct LDL: 136.3 mg/dL

## 2010-11-13 MED ORDER — OMEPRAZOLE 20 MG PO CPDR: 20.0000 mg | DELAYED_RELEASE_CAPSULE | Freq: Every day | ORAL | Status: DC

## 2010-11-13 MED ORDER — METAXALONE 800 MG PO TABS: 800.0000 mg | ORAL_TABLET | Freq: Every day | ORAL | Status: DC | PRN

## 2010-11-13 MED ORDER — ROSUVASTATIN CALCIUM 20 MG PO TABS: 20.0000 mg | ORAL_TABLET | ORAL | Status: DC

## 2010-11-13 MED ORDER — VALSARTAN 160 MG PO TABS: 160.0000 mg | ORAL_TABLET | Freq: Every day | ORAL | Status: DC

## 2010-11-13 MED ORDER — PAROXETINE HCL 20 MG PO TABS: 20.0000 mg | ORAL_TABLET | Freq: Two times a day (BID) | ORAL | Status: DC

## 2010-11-13 NOTE — Assessment & Plan Note (Addendum)
Intolerant of metformin due to diarrhea Continue actos - check a1c and titrate up/down as needed Also rx glucometer/supplies to look for ?hypoglycemia  Lab Results  Component Value Date   HGBA1C 6.0 05/31/2010

## 2010-11-13 NOTE — Assessment & Plan Note (Signed)
Resumed simva 05/2010 - caused leg pains Changed to crestor 20 every other day 08/2010  Recheck lipids today

## 2010-11-13 NOTE — Progress Notes (Signed)
Subjective:    Patient ID: [name], female    DOB: 1933/08/06, 77 y.o.   MRN: [id number]  HPI Here for medicare wellness  Diet: heart healthy, DM  Physical activity: fair/active Depression/mood screen: negative/well controlled on tx Hearing: intact to whispered voice Visual acuity: grossly normal, performs annual eye exam  ADLs: capable Fall risk: none Home safety: good Cognitive evaluation: intact to orientation, naming, recall and repetition EOL planning: adv directives, full code/ I agree  I have personally reviewed and have noted 1. The patient's medical and social history 2. Their use of alcohol, tobacco or illicit drugs 3. Their current medications and supplements 4. The patient's functional ability including ADL's, fall risks, home safety risks and hearing or visual impairment. 5. Diet and physical activities 6. Evidence for depression or mood disorders  Also reviewed chronic medical issues:  anxiety and depression - well controlled - no sadness/SI/HI/sleep issues, prior increase depression symptoms spring 2012 precipitated by stress (illness and hosp/rehab of spouse) - long hx same so paxil dose increased 06/2010 - improved, no SI/HI - also using prn clonazepam for same with good relief -  DM2 - intol of metformin due to side effects - on actos without change since that time. does not check cbgs at home. occ "shaky and sweaty" before meals (1-2x/wk), ?hypoglycemia  dyslipidemia - prev on simva - stopped 01/2010 due to generalized myalgias - no significant improvement in symptoms off med and concerned about uncontrolled chol off tx - tried to resume 06/2010 but recurrent myalgia  - resumed 08/2010 crestor at qod dose  Past Medical History  Diagnosis Date  . ARTHRITIS     s/p bilateral THR  . MIGRAINE HEADACHE   . Interstitial cystitis   . ALLERGIC RHINITIS   . ANEMIA, IRON DEFICIENCY   . ANXIETY   . ASTHMA, EXTRINSIC   . EMPHYSEMA   . HYPERTENSION   .  HYPERLIPIDEMIA   . G E R D   . DIABETES MELLITUS, TYPE II   . FIBROMYALGIA     fibromyalgia  . Chronic constipation    Family History  Problem Relation Age of Onset  . Arthritis Mother   . Hypertension Mother   . Heart disease Father   . Arthritis Father   . Hypertension Father   . Depression Sister   . Hyperlipidemia Sister   . Hypertension Other   . Hyperlipidemia Other    History  Substance Use Topics  . Smoking status: Former Smoker    Types: Cigarettes    Quit date: 03/25/1968  . Smokeless tobacco: Never Used   Comment: Married with two children. Retired Scientist, clinical (histocompatibility and immunogenetics)  . Alcohol Use: No    Review of Systems  Constitutional: Positive for fatigue.  Respiratory: Negative for shortness of breath.   Cardiovascular: Negative for chest pain.  Neurological: Negative for weakness and headaches.  No other specific complaints in a complete review of systems (except as listed in HPI above).      Objective:   Physical Exam  BP 112/78  Pulse 68  Temp(Src) 97.7 F (36.5 C) (Oral)  Ht 5\' 5"  (1.651 m)  Wt 168 lb (76.204 kg)  BMI 27.96 kg/m2  SpO2 96% Wt Readings from Last 3 Encounters:  11/13/10 168 lb (76.204 kg)  09/10/10 169 lb (76.658 kg)  09/04/10 169 lb 9.6 oz (76.93 kg)   Constitutional: She appears well-developed and well-nourished. No distress. spouse at side HENT: Head: Normocephalic and atraumatic. Ears: B TMs ok, no erythema  or effusion; Nose: Nose normal.  Mouth/Throat: Oropharynx is clear and moist. No oropharyngeal exudate.  Eyes: Conjunctivae and EOM are normal. Pupils are equal, round, and reactive to light. No scleral icterus.  Neck: Normal range of motion. Neck supple. No JVD or LAD present. No thyromegaly present.  Cardiovascular: Normal rate, regular rhythm and normal heart sounds.  No murmur heard. No BLE edema. Pulmonary/Chest: Effort normal and breath sounds normal. No respiratory distress. She has no wheezes.  Abdominal: Soft. Bowel sounds are  normal. She exhibits no distension. There is no tenderness. no masses Musculoskeletal: Normal range of motion, no joint effusions. No gross deformities Neurological: She is alert and oriented to person, place, and time. No cranial nerve deficit. Coordination normal.  Skin: Skin is warm and dry. No rash noted. No erythema.  Psychiatric: She has a normal mood and affect. Her behavior is normal. Judgment and thought content normal.    Lab Results  Component Value Date   WBC 5.4 11/03/2009   HGB 12.5 08/21/2010   HCT 36.7 08/21/2010   PLT 158 08/21/2010   CHOL 295* 05/31/2010   TRIG 247.0* 05/31/2010   HDL 52.60 05/31/2010   LDLDIRECT 213.1 05/31/2010   ALT 26 05/31/2010   AST 26 05/31/2010   NA 139 05/21/2010   NA 139 05/21/2010   K 3.8 05/21/2010   K 3.8 05/21/2010   CL 102 05/21/2010   CL 102 05/21/2010   CREATININE 0.7 05/31/2010   BUN 13 05/21/2010   BUN 13 05/21/2010   CO2 27 05/21/2010   CO2 27 05/21/2010   HGBA1C 6.0 05/31/2010   EKG: LBBB, 71bpm - (no prior EKG on record to compare)     Assessment & Plan:  AWV - v70.0 -Today patient counseled on age appropriate routine health concerns for screening and prevention, each reviewed and up to date or declined. Immunizations reviewed and up to date or declined. Labs/ECG reviewed. Risk factors for depression reviewed and negative/treated. Hearing function and visual acuity are intact. ADLs screened and addressed as needed. Functional ability and level of safety reviewed and appropriate. Education, counseling and referrals performed based on assessed risks today. Patient provided with a copy of personalized plan for preventive services.  LBBB - no anginal symptoms - no prior EKG on EMR to compare - refer to cards to eval same given multiple CRF (dm, chol, age)  Also see problem list. Medications and labs reviewed today.

## 2010-11-13 NOTE — Assessment & Plan Note (Signed)
inc symptoms 06/2010 precipitated by illness of spouse - increased dose paxil cr to bid spring 2012 and doing well Changes SSRI to short acting form, slightly lower dose 08/2010 - continue same

## 2010-11-13 NOTE — Patient Instructions (Signed)
It was good to see you today. Test(s) ordered today. Your results will be called to you after review (48-72hours after test completion). If any changes need to be made, you will be notified at that time. Tdap shot given today - other immunizations and health maintained up to date Medications reviewed, no changes at this time. Refill on medication(s) as discussed today. we'll make referral to Dr. Allyson Sabal for cardiac review. Our office will contact you regarding appointment(s) once made. Glucometer and supplies to check your sugar 1-2x/week and as needed for ?low sugar episodes - call if >200 or <70 Please schedule followup in 6 months, call sooner if problems.

## 2010-11-14 ENCOUNTER — Other Ambulatory Visit: Payer: Self-pay | Admitting: Internal Medicine

## 2010-11-14 DIAGNOSIS — E785 Hyperlipidemia, unspecified: Secondary | ICD-10-CM

## 2010-11-22 ENCOUNTER — Other Ambulatory Visit: Payer: Self-pay | Admitting: Oncology

## 2010-11-22 ENCOUNTER — Encounter (HOSPITAL_BASED_OUTPATIENT_CLINIC_OR_DEPARTMENT_OTHER): Payer: Medicare Other | Admitting: Oncology

## 2010-11-22 DIAGNOSIS — K219 Gastro-esophageal reflux disease without esophagitis: Secondary | ICD-10-CM

## 2010-11-22 DIAGNOSIS — I10 Essential (primary) hypertension: Secondary | ICD-10-CM

## 2010-11-22 DIAGNOSIS — D649 Anemia, unspecified: Secondary | ICD-10-CM

## 2010-11-22 DIAGNOSIS — M810 Age-related osteoporosis without current pathological fracture: Secondary | ICD-10-CM

## 2010-11-22 LAB — COMPREHENSIVE METABOLIC PANEL
AST: 19 U/L (ref 0–37)
Albumin: 4.5 g/dL (ref 3.5–5.2)
Alkaline Phosphatase: 54 U/L (ref 39–117)
Potassium: 4.6 mEq/L (ref 3.5–5.3)
Sodium: 141 mEq/L (ref 135–145)
Total Bilirubin: 0.3 mg/dL (ref 0.3–1.2)
Total Protein: 6.3 g/dL (ref 6.0–8.3)

## 2010-11-22 LAB — CBC WITH DIFFERENTIAL/PLATELET
Basophils Absolute: 0 10*3/uL (ref 0.0–0.1)
EOS%: 2.4 % (ref 0.0–7.0)
Eosinophils Absolute: 0.1 10*3/uL (ref 0.0–0.5)
HCT: 36.4 % (ref 34.8–46.6)
HGB: 12.4 g/dL (ref 11.6–15.9)
MCH: 31.5 pg (ref 25.1–34.0)
NEUT#: 2.8 10*3/uL (ref 1.5–6.5)
NEUT%: 58.9 % (ref 38.4–76.8)
lymph#: 1.3 10*3/uL (ref 0.9–3.3)

## 2010-11-22 LAB — IRON AND TIBC
%SAT: 14 % — ABNORMAL LOW (ref 20–55)
UIBC: 283 ug/dL (ref 125–400)

## 2010-12-11 ENCOUNTER — Other Ambulatory Visit: Payer: Self-pay | Admitting: *Deleted

## 2010-12-11 MED ORDER — PIOGLITAZONE HCL 30 MG PO TABS: 15.0000 mg | ORAL_TABLET | Freq: Every day | ORAL | Status: DC

## 2010-12-11 MED ORDER — MELOXICAM 15 MG PO TABS: 15.0000 mg | ORAL_TABLET | Freq: Every day | ORAL | Status: DC

## 2010-12-11 MED ORDER — CLONAZEPAM 0.5 MG PO TABS: 0.5000 mg | ORAL_TABLET | Freq: Every day | ORAL | Status: DC | PRN

## 2010-12-12 NOTE — Telephone Encounter (Signed)
Rx faxed to pharmacy  

## 2011-01-01 ENCOUNTER — Encounter: Payer: Self-pay | Admitting: Critical Care Medicine

## 2011-01-01 ENCOUNTER — Ambulatory Visit (INDEPENDENT_AMBULATORY_CARE_PROVIDER_SITE_OTHER): Payer: Medicare Other | Admitting: Critical Care Medicine

## 2011-01-01 DIAGNOSIS — J45909 Unspecified asthma, uncomplicated: Secondary | ICD-10-CM

## 2011-01-01 NOTE — Assessment & Plan Note (Signed)
Moderate persistent asthma now stable with significant atopic component Plan Continued inhaled medications as prescribed Note patient has already received flu vaccine Continue Singulair

## 2011-01-01 NOTE — Progress Notes (Signed)
Subjective:    Patient ID: [name], female    DOB: [birth year], 77 y.o.   MRN: [id number]  HPI  78 y.o.    female who presents with asthma with reflux disease and mild sinusitis as triggers for the patient's asthma. The patient had received immunotherapy in the past without improvement. She had did have positive skin testing. However, her IgE level obtained at the last visit was only 4.5. Sinus CT scan showed only mild sinusitis without overt infection. She is not a Xolair candidate due to the low IgE levels.   01/01/2011 No new issues since 6/12 Asthma History:   Symptoms  0-2 days/week         Nighttime Awakenings  0-2/month         Asthma interference with normal activity  No limitations         SABA use (not for EIB)  0-2 days/wk         Risk: Exacerbations requiring oral systemic steroids  0-1 / year           Now no real cough.  No real heartburn   Review of Systems  Constitutional:   No  weight loss, night sweats,  Fevers, chills, fatigue, lassitude. HEENT:   No headaches,  Difficulty swallowing,  Tooth/dental problems,  Sore throat,                No sneezing, itching, ear ache, nasal congestion, post nasal drip,   CV:  No chest pain,  Orthopnea, PND, swelling in lower extremities, anasarca, dizziness, palpitations  GI  No heartburn, indigestion, abdominal pain, nausea, vomiting, diarrhea, change in bowel habits, loss of appetite  Resp: No shortness of breath with exertion or at rest.  No excess mucus, no productive cough,  No non-productive cough,  No coughing up of blood.  No change in color of mucus.  No wheezing.  No chest wall deformity  Skin: no rash or lesions.  GU: no dysuria, change in color of urine, no urgency or frequency.  No flank pain.  MS:  No joint pain or swelling.  No decreased range of motion.  No back pain.  Psych:  No change in mood or affect. No depression or anxiety.  No memory loss.     Objective:   Physical Exam  Filed Vitals:   01/01/11 1105  BP: 136/84  Pulse: 78  Temp: 97.7 F (36.5 C)  TempSrc: Oral  Height: 5\' 5"  (1.651 m)  Weight: 169 lb 12.8 oz (77.021 kg)  SpO2: 94%    Gen: Pleasant, well-nourished, in no distress,  normal affect  ENT: No lesions,  mouth clear,  oropharynx clear, no postnasal drip  Neck: No JVD, no TMG, no carotid bruits  Lungs: No use of accessory muscles, no dullness to percussion, clear without rales or rhonchi  Cardiovascular: RRR, heart sounds normal, no murmur or gallops, no peripheral edema  Abdomen: soft and NT, no HSM,  BS normal  Musculoskeletal: No deformities, no cyanosis or clubbing  Neuro: alert, non focal  Skin: Warm, no lesions or rashes        Assessment & Plan:   ASTHMA, EXTRINSIC Moderate persistent asthma now stable with significant atopic component Plan Continued inhaled medications as prescribed Note patient has already received flu vaccine Continue Singulair     Updated Medication List Outpatient Encounter Prescriptions as of 01/01/2011  Medication Sig Dispense Refill  . albuterol (PROAIR HFA) 108 (90 BASE) MCG/ACT inhaler Inhale 2 puffs into the  lungs every 6 (six) hours as needed.        . Calcium-Vitamin D (SUPER CALCIUM/D) 600-125 MG-UNIT TABS Take by mouth 2 (two) times daily.        . cetirizine (ZYRTEC) 10 MG tablet Take 10 mg by mouth daily.        . Cholecalciferol (VITAMIN D3) 1000 UNITS CAPS Take by mouth daily.        . clonazePAM (KLONOPIN) 0.5 MG tablet Take 1 tablet (0.5 mg total) by mouth daily as needed.  30 tablet  1  . Docusate Sodium (STOOL SOFTENER) 100 MG capsule Take 100 mg by mouth daily.        . ferrous sulfate (IRON SUPPLEMENT) 325 (65 FE) MG tablet Take 325 mg by mouth daily.       . fluticasone (FLONASE) 50 MCG/ACT nasal spray 2 sprays by Nasal route daily.        . meloxicam (MOBIC) 15 MG tablet Take 1 tablet (15 mg total) by mouth daily.  90 tablet  1  . metaxalone (SKELAXIN) 800 MG tablet Take 1 tablet (800  mg total) by mouth daily as needed.  90 tablet  1  . Multiple Vitamin (MULTIVITAMIN) tablet Take 1 tablet by mouth daily.        Marland Kitchen omeprazole (PRILOSEC) 20 MG capsule Take 1 capsule (20 mg total) by mouth daily.  90 capsule  3  . PARoxetine (PAXIL) 20 MG tablet Take 1 tablet (20 mg total) by mouth 2 (two) times daily.  180 tablet  3  . pioglitazone (ACTOS) 30 MG tablet Take 0.5 tablets (15 mg total) by mouth daily.  90 tablet  2  . rosuvastatin (CRESTOR) 20 MG tablet Take 20mg  tab by mouth 5 times weekly (or as directed)  90 tablet  3  . SINGULAIR 10 MG tablet TAKE 1 TABLET BY MOUTH ONCE DAILY  90 tablet  6  . SYMBICORT 160-4.5 MCG/ACT inhaler INHALE 2 PUFFS BY MOUTH TWICE DAILY  10.2 g  5  . valsartan (DIOVAN) 160 MG tablet Take 1 tablet (160 mg total) by mouth daily.  90 tablet  3  . DISCONTD: naproxen (NAPROSYN) 500 MG tablet Take 1 tablet (500 mg total) by mouth 2 (two) times daily with a meal. X 1 week, then as needed  60 tablet  0

## 2011-01-01 NOTE — Patient Instructions (Signed)
No change in medications. Return in         4 months 

## 2011-01-04 ENCOUNTER — Ambulatory Visit: Payer: Medicare Other

## 2011-02-11 ENCOUNTER — Telehealth: Payer: Self-pay | Admitting: Internal Medicine

## 2011-02-11 NOTE — Telephone Encounter (Signed)
Received copies from [facility],on 02/11/11. Forwarded  2pages to Dr. Morrison Old review.

## 2011-02-19 ENCOUNTER — Other Ambulatory Visit: Payer: Self-pay | Admitting: *Deleted

## 2011-02-19 MED ORDER — CLONAZEPAM 0.5 MG PO TABS: 0.5000 mg | ORAL_TABLET | Freq: Every day | ORAL | Status: DC | PRN

## 2011-02-19 NOTE — Telephone Encounter (Signed)
Faxed script back to [facility]...02/19/11@4 ;09pm/LMB

## 2011-02-27 ENCOUNTER — Ambulatory Visit: Payer: Medicare Other | Admitting: Critical Care Medicine

## 2011-04-04 ENCOUNTER — Encounter: Payer: Self-pay | Admitting: Internal Medicine

## 2011-04-05 ENCOUNTER — Other Ambulatory Visit: Payer: Self-pay | Admitting: Critical Care Medicine

## 2011-04-05 MED ORDER — MONTELUKAST SODIUM 10 MG PO TABS: 10.0000 mg | ORAL_TABLET | Freq: Every day | ORAL | Status: DC

## 2011-04-05 MED ORDER — FLUTICASONE PROPIONATE 50 MCG/ACT NA SUSP: 2.0000 | Freq: Every day | NASAL | Status: DC

## 2011-04-05 MED ORDER — ALBUTEROL SULFATE HFA 108 (90 BASE) MCG/ACT IN AERS: 2.0000 | INHALATION_SPRAY | Freq: Four times a day (QID) | RESPIRATORY_TRACT | Status: DC | PRN

## 2011-04-05 MED ORDER — BUDESONIDE-FORMOTEROL FUMARATE 160-4.5 MCG/ACT IN AERO: 2.0000 | INHALATION_SPRAY | Freq: Two times a day (BID) | RESPIRATORY_TRACT | Status: DC

## 2011-04-06 ENCOUNTER — Telehealth: Payer: Self-pay | Admitting: Oncology

## 2011-04-06 NOTE — Telephone Encounter (Signed)
Mailed the pt her feb 2013 appt calendar °

## 2011-04-12 ENCOUNTER — Ambulatory Visit (INDEPENDENT_AMBULATORY_CARE_PROVIDER_SITE_OTHER): Payer: Medicare Other | Admitting: Internal Medicine

## 2011-04-12 ENCOUNTER — Encounter: Payer: Self-pay | Admitting: Internal Medicine

## 2011-04-12 ENCOUNTER — Other Ambulatory Visit (INDEPENDENT_AMBULATORY_CARE_PROVIDER_SITE_OTHER): Payer: Medicare Other

## 2011-04-12 VITALS — BP 136/82 | HR 80 | Temp 97.2°F | Resp 16 | Wt 163.0 lb

## 2011-04-12 DIAGNOSIS — B029 Zoster without complications: Secondary | ICD-10-CM

## 2011-04-12 DIAGNOSIS — R233 Spontaneous ecchymoses: Secondary | ICD-10-CM

## 2011-04-12 DIAGNOSIS — D509 Iron deficiency anemia, unspecified: Secondary | ICD-10-CM | POA: Diagnosis not present

## 2011-04-12 DIAGNOSIS — E785 Hyperlipidemia, unspecified: Secondary | ICD-10-CM

## 2011-04-12 DIAGNOSIS — E119 Type 2 diabetes mellitus without complications: Secondary | ICD-10-CM | POA: Diagnosis not present

## 2011-04-12 DIAGNOSIS — I10 Essential (primary) hypertension: Secondary | ICD-10-CM

## 2011-04-12 HISTORY — DX: Zoster without complications: B02.9

## 2011-04-12 LAB — CBC WITH DIFFERENTIAL/PLATELET
Basophils Absolute: 0 10*3/uL (ref 0.0–0.1)
Eosinophils Absolute: 0.1 10*3/uL (ref 0.0–0.7)
Hemoglobin: 13.4 g/dL (ref 12.0–15.0)
Lymphocytes Relative: 16.3 % (ref 12.0–46.0)
MCHC: 33.9 g/dL (ref 30.0–36.0)
Monocytes Relative: 6.2 % (ref 3.0–12.0)
Neutrophils Relative %: 75.3 % (ref 43.0–77.0)
Platelets: 158 10*3/uL (ref 150.0–400.0)
RDW: 15.4 % — ABNORMAL HIGH (ref 11.5–14.6)

## 2011-04-12 LAB — LIPID PANEL
Cholesterol: 298 mg/dL — ABNORMAL HIGH (ref 0–200)
HDL: 58.8 mg/dL (ref >39.00)
Triglycerides: 311 mg/dL — ABNORMAL HIGH (ref 0.0–149.0)
VLDL: 62.2 mg/dL — ABNORMAL HIGH (ref 0.0–40.0)

## 2011-04-12 LAB — COMPREHENSIVE METABOLIC PANEL
Alkaline Phosphatase: 63 U/L (ref 39–117)
BUN: 18 mg/dL (ref 6–23)
CO2: 29 mEq/L (ref 19–32)
GFR: 63.09 mL/min (ref >60.00)
Glucose, Bld: 120 mg/dL — ABNORMAL HIGH (ref 70–99)
Total Bilirubin: 0.5 mg/dL (ref 0.3–1.2)
Total Protein: 6.6 g/dL (ref 6.0–8.3)

## 2011-04-12 LAB — IBC PANEL: Saturation Ratios: 16.5 % — ABNORMAL LOW (ref 20.0–50.0)

## 2011-04-12 LAB — TSH: TSH: 1.67 u[IU]/mL (ref 0.35–5.50)

## 2011-04-12 LAB — PROTIME-INR: INR: 0.9 ratio (ref 0.8–1.0)

## 2011-04-12 LAB — APTT: aPTT: 28.9 s — ABNORMAL HIGH (ref 21.7–28.8)

## 2011-04-12 LAB — SEDIMENTATION RATE: Sed Rate: 11 mm/hr (ref 0–22)

## 2011-04-12 LAB — FERRITIN: Ferritin: 86.1 ng/mL (ref 10.0–291.0)

## 2011-04-12 MED ORDER — VALACYCLOVIR HCL 500 MG PO TABS: 500.0000 mg | ORAL_TABLET | Freq: Three times a day (TID) | ORAL | Status: DC

## 2011-04-12 NOTE — Patient Instructions (Signed)
Diabetes, Type 2 Diabetes is a long-lasting (chronic) disease. In type 2 diabetes, the pancreas does not make enough insulin (a hormone), and the body does not respond normally to the insulin that is made. This type of diabetes was also previously called adult-onset diabetes. It usually occurs after the age of 34, but it can occur at any age.  CAUSES  Type 2 diabetes happens because the pancreasis not making enough insulin or your body has trouble using the insulin that your pancreas does make properly. SYMPTOMS   Drinking more than usual.   Urinating more than usual.   Blurred vision.   Dry, itchy skin.   Frequent infections.   Feeling more tired than usual (fatigue).  DIAGNOSIS The diagnosis of type 2 diabetes is usually made by one of the following tests:  Fasting blood glucose test. You will not eat for at least 8 hours and then take a blood test.   Random blood glucose test. Your blood glucose (sugar) is checked at any time of the day regardless of when you ate.   Oral glucose tolerance test (OGTT). Your blood glucose is measured after you have not eaten (fasted) and then after you drink a glucose containing beverage.  TREATMENT   Healthy eating.   Exercise.   Medicine, if needed.   Monitoring blood glucose.   Seeing your caregiver regularly.  HOME CARE INSTRUCTIONS   Check your blood glucose at least once a day. More frequent monitoring may be necessary, depending on your medicines and on how well your diabetes is controlled. Your caregiver will advise you.   Take your medicine as directed by your caregiver.   Do not smoke.   Make wise food choices. Ask your caregiver for information. Weight loss can improve your diabetes.   Learn about low blood glucose (hypoglycemia) and how to treat it.   Get your eyes checked regularly.   Have a yearly physical exam. Have your blood pressure checked and your blood and urine tested.   Wear a pendant or bracelet saying  that you have diabetes.   Check your feet every night for cuts, sores, blisters, and redness. Let your caregiver know if you have any problems.  SEEK MEDICAL CARE IF:   You have problems keeping your blood glucose in target range.   You have problems with your medicines.   You have symptoms of an illness that do not improve after 24 hours.   You have a sore or wound that is not healing.   You notice a change in vision or a new problem with your vision.   You have a fever.  MAKE SURE YOU:  Understand these instructions.   Will watch your condition.   Will get help right away if you are not doing well or get worse.  Document Released: 03/11/2005 Document Revised: 11/22/2010 Document Reviewed: 08/27/2010 [facility] Patient Information 2012 [place], [place].Shingles Shingles is caused by the same virus that causes chickenpox (varicella zoster virus or VZV). Shingles often occurs many years or decades after having chickenpox. That is why it is more common in adults older than 50 years. The virus reactivates and breaks out as an infection in a nerve root. SYMPTOMS   The initial feeling (sensations) may be pain. This pain is usually described as:   Burning.   Stabbing.   Throbbing.   Tingling in the nerve root.   A red rash will follow in a couple days. The rash may occur in any area of the body  and is usually on one side (unilateral) of the body in a band or belt-like pattern. The rash usually starts out as very small blisters (vesicles). They will dry up after 7 to 10 days. This is not usually a significant problem except for the pain it causes.   Long-lasting (chronic) pain is more likely in an elderly person. It can last months to years. This condition is called postherpetic neuralgia.  Shingles can be an extremely severe infection in someone with AIDS, a weakened immune system, or with forms of leukemia. It can also be severe if you are taking transplant medicines or other  medicines that weaken the immune system. TREATMENT  Your caregiver will often treat you with:  Antiviral drugs.   Anti-inflammatory drugs.   Pain medicines.  Bed rest is very important in preventing the pain associated with herpes zoster (postherpetic neuralgia). Application of heat in the form of a hot water bottle or electric heating pad or gentle pressure with the hand is recommended to help with the pain or discomfort. PREVENTION  A varicella zoster vaccine is available to help protect against the virus. The Food and Drug Administration approved the varicella zoster vaccine for individuals 76 years of age and older. HOME CARE INSTRUCTIONS   Cool compresses to the area of rash may be helpful.   Only take over-the-counter or prescription medicines for pain, discomfort, or fever as directed by your caregiver.   Avoid contact with:   Babies.   Pregnant women.   Children with eczema.   Elderly people with transplants.   People with chronic illnesses, such as leukemia and AIDS.   If the area involved is on your face, you may receive a referral for follow-up to a specialist. It is very important to keep all follow-up appointments. This will help avoid eye complications, chronic pain, or disability.  SEEK IMMEDIATE MEDICAL CARE IF:   You develop any pain (headache) in the area of the face or eye. This must be followed carefully by your caregiver or ophthalmologist. An infection in part of your eye (cornea) can be very serious. It could lead to blindness.   You do not have pain relief from prescribed medicines.   Your redness or swelling spreads.   The area involved becomes very swollen and painful.   You have a fever.   You notice any red or painful lines extending away from the affected area toward your heart (lymphangitis).   Your condition is worsening or has changed.  Document Released: 03/11/2005 Document Revised: 11/21/2010 Document Reviewed: 02/13/2009 [facility]  Patient Information 2012 [place], [place].

## 2011-04-12 NOTE — Progress Notes (Signed)
  Subjective:    Patient ID: [name], female    DOB: [birth year], 78 y.o.   MRN: [id number]  HPI New to me she complains of rash and itching over her right lower back into her right flank. She says the area has spots that look bruised. The rash is not painful.   Review of Systems  Constitutional: Negative for fever, chills, diaphoresis, activity change, appetite change, fatigue and unexpected weight change.  HENT: Negative.   Eyes: Negative.   Respiratory: Negative for cough, choking, shortness of breath, wheezing and stridor.   Cardiovascular: Negative for chest pain, palpitations and leg swelling.  Gastrointestinal: Negative.   Genitourinary: Negative.   Musculoskeletal: Negative for myalgias, back pain, joint swelling, arthralgias and gait problem.  Skin: Positive for rash. Negative for color change, pallor and wound.  Neurological: Negative for dizziness, tremors, seizures, syncope, facial asymmetry, speech difficulty, weakness, light-headedness, numbness and headaches.  Hematological: Negative for adenopathy. Does not bruise/bleed easily.  Psychiatric/Behavioral: Negative.        Objective:   Physical Exam  Vitals reviewed. Constitutional: She is oriented to person, place, and time. She appears well-developed and well-nourished. No distress.  HENT:  Head: Normocephalic and atraumatic.  Mouth/Throat: Oropharynx is clear and moist. No oropharyngeal exudate.  Eyes: Conjunctivae are normal. Right eye exhibits no discharge. Left eye exhibits no discharge. No scleral icterus.  Neck: Normal range of motion. Neck supple. No JVD present. No tracheal deviation present. No thyromegaly present.  Cardiovascular: Normal rate, regular rhythm, normal heart sounds and intact distal pulses.  Exam reveals no gallop and no friction rub.   No murmur heard. Pulmonary/Chest: Effort normal and breath sounds normal. No stridor. No respiratory distress. She has no wheezes. She has no rales. She  exhibits no tenderness.  Abdominal: Soft. Bowel sounds are normal. She exhibits no distension and no mass. There is no tenderness. There is no rebound and no guarding.  Musculoskeletal: Normal range of motion. She exhibits no edema.  Lymphadenopathy:    She has no cervical adenopathy.  Neurological: She is oriented to person, place, and time.  Skin: Skin is warm and dry. Rash noted. She is not diaphoretic. There is erythema. No pallor.          She has about 7 dusky/erythematous macules over her right lower back and flank, there is no streaking, vesicles, warmth, induration, fluctuance. The lesions do follow a dermatomal pattern.  Psychiatric: She has a normal mood and affect. Her behavior is normal. Judgment and thought content normal.      Lab Results  Component Value Date   WBC 4.8 11/22/2010   HGB 12.4 11/22/2010   HCT 36.4 11/22/2010   PLT 151 11/22/2010   GLUCOSE 97 11/22/2010   GLUCOSE 97 11/22/2010   CHOL 215* 11/13/2010   TRIG 137.0 11/13/2010   HDL 61.30 11/13/2010   LDLDIRECT 136.3 11/13/2010   ALT 17 11/22/2010   ALT 17 11/22/2010   AST 19 11/22/2010   AST 19 11/22/2010   NA 141 11/22/2010   NA 141 11/22/2010   K 4.6 11/22/2010   K 4.6 11/22/2010   CL 105 11/22/2010   CL 105 11/22/2010   CREATININE 1.00 11/22/2010   CREATININE 1.00 11/22/2010   BUN 26* 11/22/2010   BUN 26* 11/22/2010   CO2 26 11/22/2010   CO2 26 11/22/2010   TSH 2.43 11/13/2010   HGBA1C 6.0 11/13/2010      Assessment & Plan:

## 2011-04-14 ENCOUNTER — Encounter: Payer: Self-pay | Admitting: Internal Medicine

## 2011-04-14 NOTE — Assessment & Plan Note (Signed)
Her BP is well controlled, I will check her lytes and renal function 

## 2011-04-14 NOTE — Assessment & Plan Note (Signed)
The appearance of the rash is atypical but the pattern is classic, I will start her on antivirals and will check her labs to look for other causes.

## 2011-04-14 NOTE — Assessment & Plan Note (Signed)
I will check her labs to look for secondary causes and complications, she will let me knw if she develops any new or worsening symptoms

## 2011-04-14 NOTE — Assessment & Plan Note (Signed)
I will check her FLP 

## 2011-04-19 ENCOUNTER — Ambulatory Visit: Payer: Medicare Other | Admitting: Internal Medicine

## 2011-05-03 ENCOUNTER — Encounter: Payer: Self-pay | Admitting: Critical Care Medicine

## 2011-05-03 ENCOUNTER — Ambulatory Visit (INDEPENDENT_AMBULATORY_CARE_PROVIDER_SITE_OTHER): Payer: Medicare Other | Admitting: Critical Care Medicine

## 2011-05-03 DIAGNOSIS — J45909 Unspecified asthma, uncomplicated: Secondary | ICD-10-CM | POA: Diagnosis not present

## 2011-05-03 MED ORDER — FLUTICASONE PROPIONATE 50 MCG/ACT NA SUSP: 2.0000 | Freq: Every day | NASAL | Status: DC

## 2011-05-03 MED ORDER — BUDESONIDE-FORMOTEROL FUMARATE 160-4.5 MCG/ACT IN AERO: 2.0000 | INHALATION_SPRAY | Freq: Two times a day (BID) | RESPIRATORY_TRACT | Status: DC

## 2011-05-03 MED ORDER — ALBUTEROL SULFATE HFA 108 (90 BASE) MCG/ACT IN AERS: 2.0000 | INHALATION_SPRAY | Freq: Four times a day (QID) | RESPIRATORY_TRACT | Status: DC | PRN

## 2011-05-03 MED ORDER — MONTELUKAST SODIUM 10 MG PO TABS: 10.0000 mg | ORAL_TABLET | Freq: Every day | ORAL | Status: DC

## 2011-05-03 NOTE — Assessment & Plan Note (Addendum)
Stable moderate persistent asthma on current treatment plan of long-acting bronchodilator and inhaled steroid Plan Maintain Symbicort as prescribed Return 4 months

## 2011-05-03 NOTE — Patient Instructions (Signed)
No change in medications. Return in         4 months 

## 2011-05-03 NOTE — Progress Notes (Signed)
Subjective:    Patient ID: [name], female    DOB: 1933-10-22, 78 y.o.   MRN: [id number]  Asthma Her past medical history is significant for asthma.   78 y.o.    female who presents with asthma with reflux disease   05/03/2011 Had shingles on R lower chest wall area.  Otherwise doing well PUL ASTHMA HISTORY 05/03/2011 01/01/2011 09/05/2010 09/04/2010  Symptoms 0-2 days/week 0-2 days/week 0-2 days/week 0-2 days/week  Nighttime awakenings 0-2/month 0-2/month 0-2/month 0-2/month  Interference with activity No limitations No limitations No limitations No limitations  SABA use 0-2 days/wk 0-2 days/wk 0-2 days/wk 0-2 days/wk  Exacerbations requiring oral steroids 0-1 / year 0-1 / year 0-1 / year 0-1 / year   Past Medical History  Diagnosis Date  . ARTHRITIS     s/p bilateral THR  . MIGRAINE HEADACHE   . Interstitial cystitis   . ALLERGIC RHINITIS   . ANEMIA, IRON DEFICIENCY   . ANXIETY   . ASTHMA, EXTRINSIC   . HYPERTENSION   . HYPERLIPIDEMIA   . G E R D   . DIABETES MELLITUS, TYPE II   . FIBROMYALGIA     fibromyalgia  . Chronic constipation      Family History  Problem Relation Age of Onset  . Arthritis Mother   . Hypertension Mother   . Heart disease Father   . Arthritis Father   . Hypertension Father   . Depression Sister   . Hyperlipidemia Sister   . Hypertension Other   . Hyperlipidemia Other      History   Social History  . Marital Status: Married    Spouse Name: N/A    Number of Children: N/A  . Years of Education: N/A   Occupational History  . Not on file.   Social History Main Topics  . Smoking status: Former Smoker -- 0.3 packs/day for 10 years    Types: Cigarettes    Quit date: 03/25/1968  . Smokeless tobacco: Never Used   Comment: Married with two children. Retired Scientist, clinical (histocompatibility and immunogenetics)  . Alcohol Use: No  . Drug Use: No  . Sexually Active: Not on file   Other Topics Concern  . Not on file   Social History Narrative  . No narrative on file      Allergies  Allergen Reactions  . Morphine Sulfate      Outpatient Prescriptions Prior to Visit  Medication Sig Dispense Refill  . Calcium-Vitamin D (SUPER CALCIUM/D) 600-125 MG-UNIT TABS Take by mouth 2 (two) times daily.        . cetirizine (ZYRTEC) 10 MG tablet Take 10 mg by mouth daily.        . Cholecalciferol (VITAMIN D3) 1000 UNITS CAPS Take by mouth daily.        . clonazePAM (KLONOPIN) 0.5 MG tablet Take 1 tablet (0.5 mg total) by mouth daily as needed.  30 tablet  1  . Docusate Sodium (STOOL SOFTENER) 100 MG capsule Take 100 mg by mouth daily.        . ferrous sulfate (IRON SUPPLEMENT) 325 (65 FE) MG tablet Take 325 mg by mouth daily.       . meloxicam (MOBIC) 15 MG tablet Take 1 tablet (15 mg total) by mouth daily.  90 tablet  1  . metaxalone (SKELAXIN) 800 MG tablet Take 1 tablet (800 mg total) by mouth daily as needed.  90 tablet  1  . Multiple Vitamin (MULTIVITAMIN) tablet Take 1 tablet by mouth daily.        Marland Kitchen  omeprazole (PRILOSEC) 20 MG capsule Take 1 capsule (20 mg total) by mouth daily.  90 capsule  3  . pioglitazone (ACTOS) 30 MG tablet Take 0.5 tablets (15 mg total) by mouth daily.  90 tablet  2  . rosuvastatin (CRESTOR) 20 MG tablet Take 20mg  tab by mouth 5 times weekly (or as directed)  90 tablet  3  . valsartan (DIOVAN) 160 MG tablet Take 1 tablet (160 mg total) by mouth daily.  90 tablet  3  . albuterol (PROAIR HFA) 108 (90 BASE) MCG/ACT inhaler Inhale 2 puffs into the lungs every 6 (six) hours as needed.  3 Inhaler  3  . budesonide-formoterol (SYMBICORT) 160-4.5 MCG/ACT inhaler Inhale 2 puffs into the lungs 2 (two) times daily.  3 Inhaler  3  . fluticasone (FLONASE) 50 MCG/ACT nasal spray Place 2 sprays into the nose daily.  3 g  3  . montelukast (SINGULAIR) 10 MG tablet Take 1 tablet (10 mg total) by mouth at bedtime.  90 tablet  3  . PARoxetine (PAXIL) 20 MG tablet Take 1 tablet (20 mg total) by mouth 2 (two) times daily.  180 tablet  3  . valACYclovir (VALTREX)  500 MG tablet Take 1 tablet (500 mg total) by mouth 3 (three) times daily.  30 tablet  0     Review of Systems  Constitutional:   No  weight loss, night sweats,  Fevers, chills, fatigue, lassitude. HEENT:   No headaches,  Difficulty swallowing,  Tooth/dental problems,  Sore throat,                No sneezing, itching, ear ache, nasal congestion, post nasal drip,   CV:  No chest pain,  Orthopnea, PND, swelling in lower extremities, anasarca, dizziness, palpitations  GI  No heartburn, indigestion, abdominal pain, nausea, vomiting, diarrhea, change in bowel habits, loss of appetite  Resp: No shortness of breath with exertion or at rest.  No excess mucus, no productive cough,  No non-productive cough,  No coughing up of blood.  No change in color of mucus.  No wheezing.  No chest wall deformity  Skin: no rash or lesions.  GU: no dysuria, change in color of urine, no urgency or frequency.  No flank pain.  MS:  No joint pain or swelling.  No decreased range of motion.  No back pain.  Psych:  No change in mood or affect. No depression or anxiety.  No memory loss.     Objective:   Physical Exam  Filed Vitals:   05/03/11 1402  BP: 122/70  Pulse: 77  Temp: 97.9 F (36.6 C)  TempSrc: Oral  Height: 5\' 5"  (1.651 m)  Weight: 165 lb 9.6 oz (75.116 kg)  SpO2: 95%    Gen: Pleasant, well-nourished, in no distress,  normal affect  ENT: No lesions,  mouth clear,  oropharynx clear, no postnasal drip  Neck: No JVD, no TMG, no carotid bruits  Lungs: No use of accessory muscles, no dullness to percussion, clear without rales or rhonchi  Cardiovascular: RRR, heart sounds normal, no murmur or gallops, no peripheral edema  Abdomen: soft and NT, no HSM,  BS normal  Musculoskeletal: No deformities, no cyanosis or clubbing  Neuro: alert, non focal  Skin: Warm, no lesions or rashes        Assessment & Plan:   ASTHMA, EXTRINSIC Stable moderate persistent asthma on current treatment  plan of long-acting bronchodilator and inhaled steroid Plan Maintain Symbicort as prescribed Return 4 months  Updated Medication List Outpatient Encounter Prescriptions as of 05/03/2011  Medication Sig Dispense Refill  . albuterol (PROAIR HFA) 108 (90 BASE) MCG/ACT inhaler Inhale 2 puffs into the lungs every 6 (six) hours as needed.  3 Inhaler  3  . budesonide-formoterol (SYMBICORT) 160-4.5 MCG/ACT inhaler Inhale 2 puffs into the lungs 2 (two) times daily.  3 Inhaler  3  . Calcium-Vitamin D (SUPER CALCIUM/D) 600-125 MG-UNIT TABS Take by mouth 2 (two) times daily.        . cetirizine (ZYRTEC) 10 MG tablet Take 10 mg by mouth daily.        . Cholecalciferol (VITAMIN D3) 1000 UNITS CAPS Take by mouth daily.        . clonazePAM (KLONOPIN) 0.5 MG tablet Take 1 tablet (0.5 mg total) by mouth daily as needed.  30 tablet  1  . Docusate Sodium (STOOL SOFTENER) 100 MG capsule Take 100 mg by mouth daily.        . ferrous sulfate (IRON SUPPLEMENT) 325 (65 FE) MG tablet Take 325 mg by mouth daily.       . fluticasone (FLONASE) 50 MCG/ACT nasal spray Place 2 sprays into the nose daily.  3 g  3  . meloxicam (MOBIC) 15 MG tablet Take 1 tablet (15 mg total) by mouth daily.  90 tablet  1  . metaxalone (SKELAXIN) 800 MG tablet Take 1 tablet (800 mg total) by mouth daily as needed.  90 tablet  1  . montelukast (SINGULAIR) 10 MG tablet Take 1 tablet (10 mg total) by mouth at bedtime.  90 tablet  3  . Multiple Vitamin (MULTIVITAMIN) tablet Take 1 tablet by mouth daily.        Marland Kitchen omeprazole (PRILOSEC) 20 MG capsule Take 1 capsule (20 mg total) by mouth daily.  90 capsule  3  . PARoxetine (PAXIL) 20 MG tablet Take 20 mg by mouth daily.      . pioglitazone (ACTOS) 30 MG tablet Take 0.5 tablets (15 mg total) by mouth daily.  90 tablet  2  . rosuvastatin (CRESTOR) 20 MG tablet Take 20mg  tab by mouth 5 times weekly (or as directed)  90 tablet  3  . valsartan (DIOVAN) 160 MG tablet Take 1 tablet (160 mg total) by  mouth daily.  90 tablet  3  . DISCONTD: albuterol (PROAIR HFA) 108 (90 BASE) MCG/ACT inhaler Inhale 2 puffs into the lungs every 6 (six) hours as needed.  3 Inhaler  3  . DISCONTD: budesonide-formoterol (SYMBICORT) 160-4.5 MCG/ACT inhaler Inhale 2 puffs into the lungs 2 (two) times daily.  3 Inhaler  3  . DISCONTD: fluticasone (FLONASE) 50 MCG/ACT nasal spray Place 2 sprays into the nose daily.  3 g  3  . DISCONTD: montelukast (SINGULAIR) 10 MG tablet Take 1 tablet (10 mg total) by mouth at bedtime.  90 tablet  3  . DISCONTD: PARoxetine (PAXIL) 20 MG tablet Take 1 tablet (20 mg total) by mouth 2 (two) times daily.  180 tablet  3  . DISCONTD: valACYclovir (VALTREX) 500 MG tablet Take 1 tablet (500 mg total) by mouth 3 (three) times daily.  30 tablet  0

## 2011-05-20 ENCOUNTER — Ambulatory Visit (HOSPITAL_BASED_OUTPATIENT_CLINIC_OR_DEPARTMENT_OTHER): Payer: Medicare Other | Admitting: Oncology

## 2011-05-20 ENCOUNTER — Other Ambulatory Visit: Payer: Medicare Other | Admitting: Lab

## 2011-05-20 VITALS — BP 130/59 | HR 85 | Temp 97.6°F | Ht 65.0 in | Wt 165.9 lb

## 2011-05-20 DIAGNOSIS — D509 Iron deficiency anemia, unspecified: Secondary | ICD-10-CM | POA: Diagnosis not present

## 2011-05-20 LAB — IRON AND TIBC
Iron: 48 ug/dL (ref 42–145)
UIBC: 269 ug/dL (ref 125–400)

## 2011-05-20 LAB — COMPREHENSIVE METABOLIC PANEL
Albumin: 4.5 g/dL (ref 3.5–5.2)
Alkaline Phosphatase: 58 U/L (ref 39–117)
CO2: 27 mEq/L (ref 19–32)
Glucose, Bld: 110 mg/dL — ABNORMAL HIGH (ref 70–99)
Potassium: 4 mEq/L (ref 3.5–5.3)
Sodium: 142 mEq/L (ref 135–145)
Total Protein: 6 g/dL (ref 6.0–8.3)

## 2011-05-20 LAB — CBC WITH DIFFERENTIAL/PLATELET
Basophils Absolute: 0 10*3/uL (ref 0.0–0.1)
Eosinophils Absolute: 0.1 10*3/uL (ref 0.0–0.5)
HGB: 12.6 g/dL (ref 11.6–15.9)
MCV: 93.7 fL (ref 79.5–101.0)
MONO#: 0.4 10*3/uL (ref 0.1–0.9)
MONO%: 7 % (ref 0.0–14.0)
NEUT#: 4.3 10*3/uL (ref 1.5–6.5)
Platelets: 156 10*3/uL (ref 145–400)
RBC: 4 10*6/uL (ref 3.70–5.45)
RDW: 15.3 % — ABNORMAL HIGH (ref 11.2–14.5)
WBC: 6.1 10*3/uL (ref 3.9–10.3)

## 2011-05-20 NOTE — Progress Notes (Signed)
[facility] OFFICE PROGRESS NOTE  Cc:  Rene Paci, MD, MD  DIAGNOSIS:   Normocytic anemia, most likely anemia of chronic disease and ineffective erythropoiesis and possibly iron-deficiency anemia.  CURRENT THERAPY:  Iron sulfate 325 mg p.o. daily.  INTERVAL HISTORY: [name] 78 y.o. female returns for regular follow up.  She feels stressed.  Her mother and sister recently passed away these past few weeks.  Her husband is not doing too well with URI symptoms.  She denies depression or feeling hopelessness.  However, she recently had oral herpes and zoster in the right flank.   Otherwise, she denies fatigue, headache, visual changes, confusion, drenching night sweats, palpable lymph node swelling, mucositis, odynophagia, dysphagia, nausea vomiting, jaundice, chest pain, palpitation, shortness of breath, dyspnea on exertion, productive cough, gum bleeding, epistaxis, hematemesis, hemoptysis, abdominal pain, abdominal swelling, early satiety, melena, hematochezia, hematuria, spontaneous bleeding, joint swelling, joint pain, heat or cold intolerance, bowel bladder incontinence, back pain, focal motor weakness, paresthesia, depression, suicidal or homocidal ideation, feeling hopelessness.   Past Medical History  Diagnosis Date  . ARTHRITIS     s/p bilateral THR  . MIGRAINE HEADACHE   . Interstitial cystitis   . ALLERGIC RHINITIS   . ANEMIA, IRON DEFICIENCY   . ANXIETY   . ASTHMA, EXTRINSIC   . HYPERTENSION   . HYPERLIPIDEMIA   . G E R D   . DIABETES MELLITUS, TYPE II   . FIBROMYALGIA     fibromyalgia  . Chronic constipation     Past Surgical History  Procedure Date  . Cataract extraction   . Total hip arthroplasty   . Abdominal hysterectomy   . Ovarian cyst removed   . Lumbar disc surgery     Current Outpatient Prescriptions  Medication Sig Dispense Refill  . albuterol (PROAIR HFA) 108 (90 BASE) MCG/ACT inhaler Inhale 2 puffs into the lungs every 6  (six) hours as needed.  3 Inhaler  3  . budesonide-formoterol (SYMBICORT) 160-4.5 MCG/ACT inhaler Inhale 2 puffs into the lungs 2 (two) times daily.  3 Inhaler  3  . Calcium-Vitamin D (SUPER CALCIUM/D) 600-125 MG-UNIT TABS Take by mouth 2 (two) times daily.        . cetirizine (ZYRTEC) 10 MG tablet Take 10 mg by mouth daily.        . Cholecalciferol (VITAMIN D3) 1000 UNITS CAPS Take by mouth daily.        . clonazePAM (KLONOPIN) 0.5 MG tablet Take 1 tablet (0.5 mg total) by mouth daily as needed.  30 tablet  1  . Docusate Sodium (STOOL SOFTENER) 100 MG capsule Take 100 mg by mouth daily.        . ferrous sulfate (IRON SUPPLEMENT) 325 (65 FE) MG tablet Take 325 mg by mouth daily.       . fluticasone (FLONASE) 50 MCG/ACT nasal spray Place 2 sprays into the nose daily.  3 g  3  . meloxicam (MOBIC) 15 MG tablet Take 1 tablet (15 mg total) by mouth daily.  90 tablet  1  . metaxalone (SKELAXIN) 800 MG tablet Take 1 tablet (800 mg total) by mouth daily as needed.  90 tablet  1  . montelukast (SINGULAIR) 10 MG tablet Take 1 tablet (10 mg total) by mouth at bedtime.  90 tablet  3  . Multiple Vitamin (MULTIVITAMIN) tablet Take 1 tablet by mouth daily.        Marland Kitchen omeprazole (PRILOSEC) 20 MG capsule Take 1 capsule (20 mg  total) by mouth daily.  90 capsule  3  . PARoxetine (PAXIL) 20 MG tablet Take 20 mg by mouth daily.      . pioglitazone (ACTOS) 30 MG tablet Take 0.5 tablets (15 mg total) by mouth daily.  90 tablet  2  . rosuvastatin (CRESTOR) 20 MG tablet Take 20mg  tab by mouth 5 times weekly (or as directed)  90 tablet  3  . valsartan (DIOVAN) 160 MG tablet Take 1 tablet (160 mg total) by mouth daily.  90 tablet  3    ALLERGIES:  is allergic to morphine sulfate.  REVIEW OF SYSTEMS:  The rest of the 14-point review of system was negative.   Filed Vitals:   05/20/11 1352  BP: 130/59  Pulse: 85  Temp: 97.6 F (36.4 C)   Wt Readings from Last 3 Encounters:  05/20/11 165 lb 14.4 oz (75.252 kg)    05/03/11 165 lb 9.6 oz (75.116 kg)  04/12/11 163 lb (73.936 kg)   ECOG Performance status: 0  PHYSICAL EXAMINATION:   General:  well-nourished in no acute distress.  Eyes:  no scleral icterus.  ENT:  There were no oropharyngeal lesions.  There was a honey crusted lesion on the right lower lib.  Neck was without thyromegaly.  Lymphatics:  Negative cervical, supraclavicular or axillary adenopathy.  Respiratory: lungs were clear bilaterally without wheezing or crackles.  Cardiovascular:  Regular rate and rhythm, S1/S2, without murmur, rub or gallop.  There was no pedal edema.  GI:  abdomen was soft, flat, nontender, nondistended, without organomegaly.  Muscoloskeletal:  no spinal tenderness of palpation of vertebral spine.  Skin exam was without echymosis, petichae.  There was a scab in the right lower abdomen residual from her recent  Neuro exam was nonfocal.  Patient was able to get on and off exam table without assistance.  Gait was normal.  Patient was alerted and oriented.  Attention was good.   Language was appropriate.  Mood was normal without depression.  Speech was not pressured.  Thought content was not tangential.     LABORATORY/RADIOLOGY DATA:  Lab Results  Component Value Date   WBC 6.1 05/20/2011   HGB 12.6 05/20/2011   HCT 37.5 05/20/2011   PLT 156 05/20/2011   GLUCOSE 110* 05/20/2011   CHOL 298* 04/12/2011   TRIG 311.0* 04/12/2011   HDL 58.80 04/12/2011   LDLDIRECT 195.1 04/12/2011   ALT 20 05/20/2011   AST 23 05/20/2011   NA 142 05/20/2011   K 4.0 05/20/2011   CL 105 05/20/2011   CREATININE 0.90 05/20/2011   BUN 14 05/20/2011   CO2 27 05/20/2011   TSH 1.67 04/12/2011   INR 0.9 04/12/2011   HGBA1C 6.0 04/12/2011   ASSESSMENT AND PLAN:   1. Normocytic anemia, most likely anemia of chronic disease/ineffective erythropoiesis and possible iron-deficiency anemia.  Her hemoglobin has been normal for this past year.  I advised her to continue oral iron sup since it is not causing her much  problem except for some constipation.  2. Hypertension:  Well controlled on Valsartan per PCP.   3. Osteoarthritis:  She is on Mobic per PCP.  4. Asthma:  She is on Proair, Symbicort, Singulair, Flonase per PCP.  5. Gastroesophageal reflux disease:  She is on PPI per PCP.  6. HLP:  She is on Crestor per PCP.  7. HSV and shingles:  Recently on Acyclovir per PCP.  Resolving symptoms without post herpetic neuralgia.  8. Depression/anxiety:  Well adjusted despite her  mother and sister's recent passing.  She claims that she is at peace with their passing.  We spent a lot of time talking about this issue today.  She is on Paxil per PCP.  9. Diabetes mellitus type 2.  She denies history of diabetic complications such as neuropathy, nephropathy, coronary artery disease, CHF, CVA. 10. Dispo:  Since her anemia has completely resolved, I discharge patient back to her PCP.  I recommend yearly CBC.  I will be more than happy to see her again if her Hgb <10 or pancytopenia.  She expressed informed understanding and agreed with stated recommendation.   The length of time of the face-to-face encounter was 15 minutes. More than 50% of time was spent counseling and coordination of care.

## 2011-05-21 ENCOUNTER — Encounter: Payer: Self-pay | Admitting: Internal Medicine

## 2011-05-21 ENCOUNTER — Ambulatory Visit (INDEPENDENT_AMBULATORY_CARE_PROVIDER_SITE_OTHER): Payer: Medicare Other | Admitting: Internal Medicine

## 2011-05-21 DIAGNOSIS — E119 Type 2 diabetes mellitus without complications: Secondary | ICD-10-CM

## 2011-05-21 DIAGNOSIS — J45909 Unspecified asthma, uncomplicated: Secondary | ICD-10-CM

## 2011-05-21 DIAGNOSIS — E785 Hyperlipidemia, unspecified: Secondary | ICD-10-CM

## 2011-05-21 DIAGNOSIS — I10 Essential (primary) hypertension: Secondary | ICD-10-CM

## 2011-05-21 NOTE — Assessment & Plan Note (Signed)
Stable mod persist asthma - LABA and inhaled steroid - follows with pulm for same

## 2011-05-21 NOTE — Patient Instructions (Signed)
It was good to see you today. We have reviewed your interval history and records including labs and tests today Medications reviewed, no changes at this time. Please schedule followup in 6 months, call sooner if problems.

## 2011-05-21 NOTE — Assessment & Plan Note (Signed)
BP Readings from Last 3 Encounters:  05/21/11 122/80  05/20/11 130/59  05/03/11 122/70   The current medical regimen is effective;  continue present plan and medications.

## 2011-05-21 NOTE — Assessment & Plan Note (Signed)
Resumed simva 05/2010 - caused leg pains Changed to crestor 20 every other day 08/2010 - minimally compliant due to side effects Reviewed lipids from last month - pt will consider the rx meds No new rx change recommended today

## 2011-05-21 NOTE — Assessment & Plan Note (Signed)
Intolerant of metformin due to diarrhea Continue actos - reviewed recent a1c The current medical regimen is effective;  continue present plan and medications.   Lab Results  Component Value Date   HGBA1C 6.0 04/12/2011

## 2011-05-21 NOTE — Progress Notes (Signed)
  Subjective:    Patient ID: [name], female    DOB: 07/27/1933, 78 y.o.   MRN: [id number]  HPI  Here for follow up - reviewed chronic medical issues:  anxiety and depression - despite family stressors (mother's death and other family illness) feels symptoms well controlled - no sadness/SI/HI/sleep issues, last flare depression spring 2012 precipitated by stress (illness and hosp/rehab of spouse) - long hx same so paxil dose increased 06/2010 - improved, no SI/HI - also using prn clonazepam for same with good relief -  DM2 - intol of metformin due to side effects - on actos without change since that time. does not check cbgs at home. occ "shaky and sweaty" before meals (1-2x/wk), hypoglycemia  dyslipidemia - prev on simva - stopped 01/2010 due to generalized myalgias - no significant improvement in symptoms off med and concerned about uncontrolled chol off tx - tried to resume 06/2010 but recurrent myalgia  - resumed 08/2010 crestor at qod dose  Past Medical History  Diagnosis Date  . ARTHRITIS     s/p bilateral THR  . MIGRAINE HEADACHE   . Interstitial cystitis   . ALLERGIC RHINITIS   . ANEMIA, IRON DEFICIENCY   . ANXIETY   . ASTHMA, EXTRINSIC   . HYPERTENSION   . HYPERLIPIDEMIA   . G E R D   . DIABETES MELLITUS, TYPE II   . FIBROMYALGIA     fibromyalgia  . Chronic constipation     Review of Systems  Constitutional: Positive for fatigue. Negative for fever and unexpected weight change.  Respiratory: Negative for cough and shortness of breath.   Cardiovascular: Negative for chest pain and palpitations.  Neurological: Negative for weakness and headaches.       Objective:   Physical Exam  BP 122/80  Pulse 79  Temp(Src) 97.3 F (36.3 C) (Oral)  Ht 5\' 6"  (1.676 m)  Wt 164 lb 6.4 oz (74.571 kg)  BMI 26.53 kg/m2  SpO2 98% Wt Readings from Last 3 Encounters:  05/21/11 164 lb 6.4 oz (74.571 kg)  05/20/11 165 lb 14.4 oz (75.252 kg)  05/03/11 165 lb 9.6 oz (75.116 kg)     Constitutional: She appears well-developed and well-nourished. No distress.  Neck: Normal range of motion. Neck supple. No JVD or LAD present. No thyromegaly present.  Cardiovascular: Normal rate, regular rhythm and normal heart sounds.  No murmur heard. No BLE edema. Pulmonary/Chest: Effort normal and breath sounds normal. No respiratory distress. She has no wheezes.  Psychiatric: She has a normal mood and affect. Her behavior is normal. Judgment and thought content normal.    Lab Results  Component Value Date   WBC 6.1 05/20/2011   HGB 12.6 05/20/2011   HCT 37.5 05/20/2011   PLT 156 05/20/2011   CHOL 298* 04/12/2011   TRIG 311.0* 04/12/2011   HDL 58.80 04/12/2011   LDLDIRECT 195.1 04/12/2011   ALT 20 05/20/2011   AST 23 05/20/2011   NA 142 05/20/2011   K 4.0 05/20/2011   CL 105 05/20/2011   CREATININE 0.90 05/20/2011   BUN 14 05/20/2011   CO2 27 05/20/2011   TSH 1.67 04/12/2011   INR 0.9 04/12/2011   HGBA1C 6.0 04/12/2011        Assessment & Plan:   see problem list. Medications and labs reviewed today. Time spent with pt today 25 minutes, greater than 50% time spent counseling patient on diabetes, depression/anxiety/stressors and recent lab-medication review. Also review of interval records from other providers

## 2011-05-22 ENCOUNTER — Other Ambulatory Visit: Payer: Self-pay | Admitting: Internal Medicine

## 2011-05-22 DIAGNOSIS — Z1231 Encounter for screening mammogram for malignant neoplasm of breast: Secondary | ICD-10-CM

## 2011-06-14 ENCOUNTER — Ambulatory Visit (HOSPITAL_COMMUNITY)
Admission: RE | Admit: 2011-06-14 | Discharge: 2011-06-14 | Disposition: A | Discharge status: Home / Self Care | Payer: Medicare Other | Source: Ambulatory Visit | Attending: Internal Medicine | Admitting: Internal Medicine

## 2011-06-14 DIAGNOSIS — Z1231 Encounter for screening mammogram for malignant neoplasm of breast: Secondary | ICD-10-CM | POA: Diagnosis not present

## 2011-07-03 DIAGNOSIS — R350 Frequency of micturition: Secondary | ICD-10-CM | POA: Diagnosis not present

## 2011-07-03 DIAGNOSIS — N3000 Acute cystitis without hematuria: Secondary | ICD-10-CM | POA: Diagnosis not present

## 2011-07-03 DIAGNOSIS — R109 Unspecified abdominal pain: Secondary | ICD-10-CM | POA: Diagnosis not present

## 2011-07-08 DIAGNOSIS — M719 Bursopathy, unspecified: Secondary | ICD-10-CM | POA: Diagnosis not present

## 2011-07-08 DIAGNOSIS — M67919 Unspecified disorder of synovium and tendon, unspecified shoulder: Secondary | ICD-10-CM | POA: Diagnosis not present

## 2011-07-16 DIAGNOSIS — M67919 Unspecified disorder of synovium and tendon, unspecified shoulder: Secondary | ICD-10-CM | POA: Diagnosis not present

## 2011-07-16 DIAGNOSIS — M719 Bursopathy, unspecified: Secondary | ICD-10-CM | POA: Diagnosis not present

## 2011-07-23 DIAGNOSIS — M719 Bursopathy, unspecified: Secondary | ICD-10-CM | POA: Diagnosis not present

## 2011-07-23 DIAGNOSIS — M67919 Unspecified disorder of synovium and tendon, unspecified shoulder: Secondary | ICD-10-CM | POA: Diagnosis not present

## 2011-07-26 DIAGNOSIS — M67919 Unspecified disorder of synovium and tendon, unspecified shoulder: Secondary | ICD-10-CM | POA: Diagnosis not present

## 2011-07-29 DIAGNOSIS — M67919 Unspecified disorder of synovium and tendon, unspecified shoulder: Secondary | ICD-10-CM | POA: Diagnosis not present

## 2011-07-29 DIAGNOSIS — M719 Bursopathy, unspecified: Secondary | ICD-10-CM | POA: Diagnosis not present

## 2011-08-05 DIAGNOSIS — I10 Essential (primary) hypertension: Secondary | ICD-10-CM | POA: Diagnosis not present

## 2011-08-05 DIAGNOSIS — E782 Mixed hyperlipidemia: Secondary | ICD-10-CM | POA: Diagnosis not present

## 2011-08-05 DIAGNOSIS — R9431 Abnormal electrocardiogram [ECG] [EKG]: Secondary | ICD-10-CM | POA: Diagnosis not present

## 2011-08-06 DIAGNOSIS — M67919 Unspecified disorder of synovium and tendon, unspecified shoulder: Secondary | ICD-10-CM | POA: Diagnosis not present

## 2011-08-06 DIAGNOSIS — M719 Bursopathy, unspecified: Secondary | ICD-10-CM | POA: Diagnosis not present

## 2011-08-08 DIAGNOSIS — M67919 Unspecified disorder of synovium and tendon, unspecified shoulder: Secondary | ICD-10-CM | POA: Diagnosis not present

## 2011-08-08 DIAGNOSIS — M719 Bursopathy, unspecified: Secondary | ICD-10-CM | POA: Diagnosis not present

## 2011-08-18 ENCOUNTER — Ambulatory Visit (INDEPENDENT_AMBULATORY_CARE_PROVIDER_SITE_OTHER): Payer: Medicare Other | Admitting: Family Medicine

## 2011-08-18 VITALS — BP 147/77 | HR 77 | Temp 97.4°F | Resp 16 | Ht 64.0 in | Wt 166.4 lb

## 2011-08-18 DIAGNOSIS — L259 Unspecified contact dermatitis, unspecified cause: Secondary | ICD-10-CM | POA: Diagnosis not present

## 2011-08-18 DIAGNOSIS — L309 Dermatitis, unspecified: Secondary | ICD-10-CM

## 2011-08-18 MED ORDER — TRIAMCINOLONE ACETONIDE 0.1 % EX CREA: TOPICAL_CREAM | Freq: Two times a day (BID) | CUTANEOUS | Status: DC

## 2011-08-18 MED ORDER — VALACYCLOVIR HCL 1 G PO TABS: 1000.0000 mg | ORAL_TABLET | Freq: Two times a day (BID) | ORAL | Status: DC

## 2011-08-18 NOTE — Patient Instructions (Signed)
Return if at all worse or a painful blistered rash occurs.

## 2011-08-18 NOTE — Progress Notes (Signed)
Subjective: 78 year old lady with a rash on her face and neck that occurred yesterday. It itches him. It is not really painful. She has had previous shingles around the right side of her chest and had concerns that this was shingles. Her father had shingles in the same distribution. She knows of no contact allergens. Has not been working in the yard, though she has walking by a cold a few weeks and things. She does have a dog. She's been to comp care for her husband who has had a lot of health problems.  Objective: Erythematous macular dermatitis on a right for head with a little bit better the channel the right side of the neck. There is one area that has a linear pattern just in today to the hairline at the top of the forehead.  Assessment: Nonspecific dermatitis, probably contact. Doubt zoster.  Plan: Triamcinolone cream. Did decide going to give her Valtrex just in case. Return if worse. Discussed shingles vaccine.

## 2011-08-27 ENCOUNTER — Telehealth: Payer: Self-pay

## 2011-08-27 MED ORDER — PAROXETINE HCL 20 MG PO TABS: 20.0000 mg | ORAL_TABLET | Freq: Every day | ORAL | Status: DC

## 2011-08-27 MED ORDER — CLONAZEPAM 0.5 MG PO TABS: 0.5000 mg | ORAL_TABLET | Freq: Every day | ORAL | Status: DC | PRN

## 2011-08-27 NOTE — Telephone Encounter (Signed)
Pt called requesting 90 day supply of Klonopin and Paroxetine. Paroxetine sent to mail order, please advise on Klonopin for this Dr Felicity Coyer pt, thanks!

## 2011-08-27 NOTE — Telephone Encounter (Signed)
Called pt no answer LMOM sent paroxetine to mail service, but Klonopin was only filled for # 30. Has to see md b4 quanity can be change since it is control substance. Faxing over klonopin renewal to [facility]... 08/27/11@12 :57pm/LMB

## 2011-08-27 NOTE — Telephone Encounter (Signed)
I can only refill what has been done before as this is a controlled substance -   Done hardcopy to [name]

## 2011-09-02 ENCOUNTER — Telehealth: Payer: Self-pay | Admitting: Critical Care Medicine

## 2011-09-02 NOTE — Telephone Encounter (Signed)
Made in error. Emily E McAlister  °

## 2011-09-10 DIAGNOSIS — H40019 Open angle with borderline findings, low risk, unspecified eye: Secondary | ICD-10-CM | POA: Diagnosis not present

## 2011-09-10 DIAGNOSIS — H26499 Other secondary cataract, unspecified eye: Secondary | ICD-10-CM | POA: Diagnosis not present

## 2011-09-10 DIAGNOSIS — Z961 Presence of intraocular lens: Secondary | ICD-10-CM | POA: Diagnosis not present

## 2011-09-10 DIAGNOSIS — H04129 Dry eye syndrome of unspecified lacrimal gland: Secondary | ICD-10-CM | POA: Diagnosis not present

## 2011-10-04 ENCOUNTER — Encounter: Payer: Self-pay | Admitting: Critical Care Medicine

## 2011-10-04 ENCOUNTER — Ambulatory Visit (INDEPENDENT_AMBULATORY_CARE_PROVIDER_SITE_OTHER): Payer: Medicare Other | Admitting: Critical Care Medicine

## 2011-10-04 VITALS — BP 126/86 | HR 90 | Temp 97.6°F | Ht 65.0 in | Wt 164.8 lb

## 2011-10-04 DIAGNOSIS — J45909 Unspecified asthma, uncomplicated: Secondary | ICD-10-CM | POA: Diagnosis not present

## 2011-10-04 MED ORDER — PREDNISONE 10 MG PO TABS: ORAL_TABLET | ORAL | Status: DC

## 2011-10-04 NOTE — Patient Instructions (Addendum)
Prednisone 10mg  Take 4 for two days three for two days two for two days one for two days No other medication changes Get an air purifier to sleep in the home Return 4 months

## 2011-10-04 NOTE — Progress Notes (Signed)
Subjective:    Patient ID: [name], female    DOB: 29-Dec-1933, 78 y.o.   MRN: [id number]  Asthma Her past medical history is significant for asthma.   78 y.o.    female who presents with asthma with reflux disease   10/04/2011 Pt noting more tightness in chest and difficulty in deep breathing. Has a sister in [place] and went to visit for three days .  No A/C and damp and not clean.   No real mucus.    PUL ASTHMA HISTORY 10/04/2011 05/03/2011 01/01/2011 09/05/2010 09/04/2010  Symptoms Daily 0-2 days/week 0-2 days/week 0-2 days/week 0-2 days/week  Nighttime awakenings 0-2/month 0-2/month 0-2/month 0-2/month 0-2/month  Interference with activity Some limitations No limitations No limitations No limitations No limitations  SABA use Daily 0-2 days/wk 0-2 days/wk 0-2 days/wk 0-2 days/wk  Exacerbations requiring oral steroids 0-1 / year 0-1 / year 0-1 / year 0-1 / year 0-1 / year   Past Medical History  Diagnosis Date  . ARTHRITIS     s/p bilateral THR  . MIGRAINE HEADACHE   . Interstitial cystitis   . ALLERGIC RHINITIS   . ANEMIA, IRON DEFICIENCY   . ANXIETY   . ASTHMA, EXTRINSIC   . HYPERTENSION   . HYPERLIPIDEMIA   . G E R D   . DIABETES MELLITUS, TYPE II   . FIBROMYALGIA     fibromyalgia  . Chronic constipation      Family History  Problem Relation Age of Onset  . Arthritis Mother   . Hypertension Mother   . Heart disease Father   . Arthritis Father   . Hypertension Father   . Depression Sister   . Hyperlipidemia Sister   . Hypertension Other   . Hyperlipidemia Other      History   Social History  . Marital Status: Married    Spouse Name: N/A    Number of Children: N/A  . Years of Education: N/A   Occupational History  . Not on file.   Social History Main Topics  . Smoking status: Former Smoker -- 0.3 packs/day for 10 years    Types: Cigarettes    Quit date: 03/25/1968  . Smokeless tobacco: Never Used   Comment: Married with two children. Retired  Scientist, clinical (histocompatibility and immunogenetics)  . Alcohol Use: No  . Drug Use: No  . Sexually Active: Not on file   Other Topics Concern  . Not on file   Social History Narrative  . No narrative on file     Allergies  Allergen Reactions  . Morphine Sulfate      Outpatient Prescriptions Prior to Visit  Medication Sig Dispense Refill  . albuterol (PROAIR HFA) 108 (90 BASE) MCG/ACT inhaler Inhale 2 puffs into the lungs every 6 (six) hours as needed.  3 Inhaler  3  . budesonide-formoterol (SYMBICORT) 160-4.5 MCG/ACT inhaler Inhale 2 puffs into the lungs 2 (two) times daily.  3 Inhaler  3  . Calcium-Vitamin D (SUPER CALCIUM/D) 600-125 MG-UNIT TABS Take by mouth 2 (two) times daily.        . cetirizine (ZYRTEC) 10 MG tablet Take 10 mg by mouth daily.        . Cholecalciferol (VITAMIN D3) 1000 UNITS CAPS Take by mouth daily.        . clonazePAM (KLONOPIN) 0.5 MG tablet Take 1 tablet (0.5 mg total) by mouth daily as needed.  30 tablet  1  . Docusate Sodium (STOOL SOFTENER) 100 MG capsule Take 100 mg  by mouth daily.        . ferrous sulfate (IRON SUPPLEMENT) 325 (65 FE) MG tablet Take 325 mg by mouth daily.       . fluticasone (FLONASE) 50 MCG/ACT nasal spray Place 2 sprays into the nose daily.  3 g  3  . metaxalone (SKELAXIN) 800 MG tablet Take 1 tablet (800 mg total) by mouth daily as needed.  90 tablet  1  . montelukast (SINGULAIR) 10 MG tablet Take 1 tablet (10 mg total) by mouth at bedtime.  90 tablet  3  . Multiple Vitamin (MULTIVITAMIN) tablet Take 1 tablet by mouth daily.        Marland Kitchen omeprazole (PRILOSEC) 20 MG capsule Take 1 capsule (20 mg total) by mouth daily.  90 capsule  3  . PARoxetine (PAXIL) 20 MG tablet Take 1 tablet (20 mg total) by mouth daily.  90 tablet  1  . pioglitazone (ACTOS) 30 MG tablet Take 0.5 tablets (15 mg total) by mouth daily.  90 tablet  2  . valsartan (DIOVAN) 160 MG tablet Take 1 tablet (160 mg total) by mouth daily.  90 tablet  3  . triamcinolone cream (KENALOG) 0.1 % Apply topically 2 (two)  times daily.  30 g  0  . valACYclovir (VALTREX) 1000 MG tablet Take 1 tablet (1,000 mg total) by mouth 2 (two) times daily.  20 tablet  0  . meloxicam (MOBIC) 15 MG tablet Take 1 tablet (15 mg total) by mouth daily.  90 tablet  1  . rosuvastatin (CRESTOR) 20 MG tablet Take 20mg  tab by mouth 5 times weekly (or as directed)  90 tablet  3     Review of Systems  Constitutional:   No  weight loss, night sweats,  Fevers, chills, fatigue, lassitude. HEENT:   No headaches,  Difficulty swallowing,  Tooth/dental problems,  Sore throat,                No sneezing, itching, ear ache, nasal congestion, post nasal drip,   CV:  No chest pain,  Orthopnea, PND, swelling in lower extremities, anasarca, dizziness, palpitations  GI  No heartburn, indigestion, abdominal pain, nausea, vomiting, diarrhea, change in bowel habits, loss of appetite  Resp: No shortness of breath with exertion or at rest.  No excess mucus, no productive cough,  No non-productive cough,  No coughing up of blood.  No change in color of mucus.  No wheezing.  No chest wall deformity  Skin: no rash or lesions.  GU: no dysuria, change in color of urine, no urgency or frequency.  No flank pain.  MS:  No joint pain or swelling.  No decreased range of motion.  No back pain.  Psych:  No change in mood or affect. No depression or anxiety.  No memory loss.     Objective:   Physical Exam  Filed Vitals:   10/04/11 1535  BP: 126/86  Pulse: 90  Temp: 97.6 F (36.4 C)  TempSrc: Oral  Height: 5\' 5"  (1.651 m)  Weight: 164 lb 12.8 oz (74.753 kg)  SpO2: 95%    Gen: Pleasant, well-nourished, in no distress,  normal affect  ENT: No lesions,  mouth clear,  oropharynx clear, no postnasal drip  Neck: No JVD, no TMG, no carotid bruits  Lungs: No use of accessory muscles, no dullness to percussion, expired wheeze  Cardiovascular: RRR, heart sounds normal, no murmur or gallops, no peripheral edema  Abdomen: soft and NT, no HSM,  BS  normal  Musculoskeletal: No deformities, no cyanosis or clubbing  Neuro: alert, non focal  Skin: Warm, no lesions or rashes        Assessment & Plan:   ASTHMA, EXTRINSIC Moderate persistent asthma with flare d/t dust/mold exposure  Plan Prednisone 10mg  Take 4 for two days three for two days two for two days one for two days No other medication changes Get an air purifier to sleep in the home Return 4 months      Updated Medication List Outpatient Encounter Prescriptions as of 10/04/2011  Medication Sig Dispense Refill  . albuterol (PROAIR HFA) 108 (90 BASE) MCG/ACT inhaler Inhale 2 puffs into the lungs every 6 (six) hours as needed.  3 Inhaler  3  . budesonide-formoterol (SYMBICORT) 160-4.5 MCG/ACT inhaler Inhale 2 puffs into the lungs 2 (two) times daily.  3 Inhaler  3  . Calcium-Vitamin D (SUPER CALCIUM/D) 600-125 MG-UNIT TABS Take by mouth 2 (two) times daily.        . cetirizine (ZYRTEC) 10 MG tablet Take 10 mg by mouth daily.        . Cholecalciferol (VITAMIN D3) 1000 UNITS CAPS Take by mouth daily.        . clonazePAM (KLONOPIN) 0.5 MG tablet Take 1 tablet (0.5 mg total) by mouth daily as needed.  30 tablet  1  . Docusate Sodium (STOOL SOFTENER) 100 MG capsule Take 100 mg by mouth daily.        . ferrous sulfate (IRON SUPPLEMENT) 325 (65 FE) MG tablet Take 325 mg by mouth daily.       . fluticasone (FLONASE) 50 MCG/ACT nasal spray Place 2 sprays into the nose daily.  3 g  3  . metaxalone (SKELAXIN) 800 MG tablet Take 1 tablet (800 mg total) by mouth daily as needed.  90 tablet  1  . montelukast (SINGULAIR) 10 MG tablet Take 1 tablet (10 mg total) by mouth at bedtime.  90 tablet  3  . Multiple Vitamin (MULTIVITAMIN) tablet Take 1 tablet by mouth daily.        Marland Kitchen omeprazole (PRILOSEC) 20 MG capsule Take 1 capsule (20 mg total) by mouth daily.  90 capsule  3  . PARoxetine (PAXIL) 20 MG tablet Take 1 tablet (20 mg total) by mouth daily.  90 tablet  1  . pioglitazone (ACTOS)  30 MG tablet Take 0.5 tablets (15 mg total) by mouth daily.  90 tablet  2  . valACYclovir (VALTREX) 1000 MG tablet Take 1,000 mg by mouth 2 (two) times daily as needed.      . valsartan (DIOVAN) 160 MG tablet Take 1 tablet (160 mg total) by mouth daily.  90 tablet  3  . DISCONTD: triamcinolone cream (KENALOG) 0.1 % Apply topically 2 (two) times daily.  30 g  0  . DISCONTD: valACYclovir (VALTREX) 1000 MG tablet Take 1 tablet (1,000 mg total) by mouth 2 (two) times daily.  20 tablet  0  . predniSONE (DELTASONE) 10 MG tablet Take 4 for two days three for two days two for two days one for two days  20 tablet  0  . triamcinolone cream (KENALOG) 0.1 % Apply topically 2 (two) times daily as needed.      Marland Kitchen DISCONTD: meloxicam (MOBIC) 15 MG tablet Take 1 tablet (15 mg total) by mouth daily.  90 tablet  1  . DISCONTD: rosuvastatin (CRESTOR) 20 MG tablet Take 20mg  tab by mouth 5 times weekly (or as directed)  90 tablet  3      

## 2011-10-06 NOTE — Assessment & Plan Note (Signed)
Moderate persistent asthma with flare d/t dust/mold exposure  Plan Prednisone 10mg  Take 4 for two days three for two days two for two days one for two days No other medication changes Get an air purifier to sleep in the home Return 4 months

## 2011-10-14 ENCOUNTER — Telehealth: Payer: Self-pay | Admitting: Critical Care Medicine

## 2011-10-14 NOTE — Telephone Encounter (Signed)
Pt has been scheduled per Crystal to see PW on Wed., 7/24 @ 9am. Pt aware that if her breathing or any of her symptoms become worse she should contact her PCP or go to the ER. Pt verbalized understanding.

## 2011-10-14 NOTE — Telephone Encounter (Signed)
Needs to see someone   ??overbook?? Or ED or PCP

## 2011-10-14 NOTE — Telephone Encounter (Signed)
I spoke with pt and she c/o shallow breathing this AM, occasional dry cough, pain in the lower part of chest. Denies any wheezing. She was giving prednisone 10/04/11 from last OV but did not help. Pt is requesting to be seen or further recs. No available openings with any provider and TP is not here this week. Please advise Dr. Delford Field thanks  Allergies  Allergen Reactions  . Morphine Sulfate

## 2011-10-16 ENCOUNTER — Encounter: Payer: Self-pay | Admitting: Critical Care Medicine

## 2011-10-16 ENCOUNTER — Ambulatory Visit (INDEPENDENT_AMBULATORY_CARE_PROVIDER_SITE_OTHER): Payer: Medicare Other | Admitting: Critical Care Medicine

## 2011-10-16 VITALS — BP 138/70 | HR 79 | Temp 97.9°F | Ht 65.0 in | Wt 163.4 lb

## 2011-10-16 DIAGNOSIS — J45909 Unspecified asthma, uncomplicated: Secondary | ICD-10-CM

## 2011-10-16 MED ORDER — PREDNISONE 10 MG PO TABS: ORAL_TABLET | ORAL | Status: DC

## 2011-10-16 NOTE — Assessment & Plan Note (Signed)
Moderate persistent asthma with poor HFA technique.   Plan  work on [facility] technique. Pulse prednisone

## 2011-10-16 NOTE — Progress Notes (Signed)
Subjective:    Patient ID: [name], female    DOB: 02/06/1934, 77 y.o.   MRN: [id number]  Asthma Her past medical history is significant for asthma.   78 y.o.    female who presents with asthma with reflux disease   10/04/2011 Pt noting more tightness in chest and difficulty in deep breathing. Has a sister in [place] and went to visit for three days .  No A/C and damp and not clean.   No real mucus.    10/16/2011 Just finished pred taper and is unimproved.  Pt notes still tight in chest.  Pt notes dry cough. No heartburn. Pt notes some pndrip.  No mucus.  No regurg or dysphagia. Pt notes a headache. No wheezing.  Pt remains very dyspneic with exertion.  No qhs dyspnea  PUL ASTHMA HISTORY 10/16/2011 10/04/2011 05/03/2011 01/01/2011 09/05/2010  Symptoms Daily Daily 0-2 days/week 0-2 days/week 0-2 days/week  Nighttime awakenings 0-2/month 0-2/month 0-2/month 0-2/month 0-2/month  Interference with activity Some limitations Some limitations No limitations No limitations No limitations  SABA use Daily Daily 0-2 days/wk 0-2 days/wk 0-2 days/wk  Exacerbations requiring oral steroids 0-1 / year 0-1 / year 0-1 / year 0-1 / year 0-1 / year   Past Medical History  Diagnosis Date  . ARTHRITIS     s/p bilateral THR  . MIGRAINE HEADACHE   . Interstitial cystitis   . ALLERGIC RHINITIS   . ANEMIA, IRON DEFICIENCY   . ANXIETY   . ASTHMA, EXTRINSIC   . HYPERTENSION   . HYPERLIPIDEMIA   . G E R D   . DIABETES MELLITUS, TYPE II   . FIBROMYALGIA     fibromyalgia  . Chronic constipation      Family History  Problem Relation Age of Onset  . Arthritis Mother   . Hypertension Mother   . Heart disease Father   . Arthritis Father   . Hypertension Father   . Depression Sister   . Hyperlipidemia Sister   . Hypertension Other   . Hyperlipidemia Other      History   Social History  . Marital Status: Married    Spouse Name: N/A    Number of Children: N/A  . Years of Education: N/A    Occupational History  . Not on file.   Social History Main Topics  . Smoking status: Former Smoker -- 0.3 packs/day for 10 years    Types: Cigarettes    Quit date: 03/25/1968  . Smokeless tobacco: Never Used   Comment: Married with two children. Retired Scientist, clinical (histocompatibility and immunogenetics)  . Alcohol Use: No  . Drug Use: No  . Sexually Active: Not on file   Other Topics Concern  . Not on file   Social History Narrative  . No narrative on file     Allergies  Allergen Reactions  . Morphine Sulfate      Outpatient Prescriptions Prior to Visit  Medication Sig Dispense Refill  . albuterol (PROAIR HFA) 108 (90 BASE) MCG/ACT inhaler Inhale 2 puffs into the lungs every 6 (six) hours as needed.  3 Inhaler  3  . budesonide-formoterol (SYMBICORT) 160-4.5 MCG/ACT inhaler Inhale 2 puffs into the lungs 2 (two) times daily.  3 Inhaler  3  . Calcium-Vitamin D (SUPER CALCIUM/D) 600-125 MG-UNIT TABS Take by mouth daily.       . cetirizine (ZYRTEC) 10 MG tablet Take 10 mg by mouth daily.        . Cholecalciferol (VITAMIN D3) 1000 UNITS  CAPS Take by mouth daily.        . clonazePAM (KLONOPIN) 0.5 MG tablet Take 1 tablet (0.5 mg total) by mouth daily as needed.  30 tablet  1  . Docusate Sodium (STOOL SOFTENER) 100 MG capsule Take 100 mg by mouth daily.        . ferrous sulfate (IRON SUPPLEMENT) 325 (65 FE) MG tablet Take 325 mg by mouth daily.       . fluticasone (FLONASE) 50 MCG/ACT nasal spray Place 2 sprays into the nose daily.  3 g  3  . metaxalone (SKELAXIN) 800 MG tablet Take 1 tablet (800 mg total) by mouth daily as needed.  90 tablet  1  . montelukast (SINGULAIR) 10 MG tablet Take 1 tablet (10 mg total) by mouth at bedtime.  90 tablet  3  . Multiple Vitamin (MULTIVITAMIN) tablet Take 1 tablet by mouth daily.        Marland Kitchen omeprazole (PRILOSEC) 20 MG capsule Take 1 capsule (20 mg total) by mouth daily.  90 capsule  3  . PARoxetine (PAXIL) 20 MG tablet Take 1 tablet (20 mg total) by mouth daily.  90 tablet  1  .  pioglitazone (ACTOS) 30 MG tablet Take 0.5 tablets (15 mg total) by mouth daily.  90 tablet  2  . triamcinolone cream (KENALOG) 0.1 % Apply topically 2 (two) times daily as needed.      . valACYclovir (VALTREX) 1000 MG tablet Take 1,000 mg by mouth 2 (two) times daily as needed.      . valsartan (DIOVAN) 160 MG tablet Take 1 tablet (160 mg total) by mouth daily.  90 tablet  3  . predniSONE (DELTASONE) 10 MG tablet Take 4 for two days three for two days two for two days one for two days  20 tablet  0     Review of Systems  Constitutional:   No  weight loss, night sweats,  Fevers, chills, fatigue, lassitude. HEENT:   No headaches,  Difficulty swallowing,  Tooth/dental problems,  Sore throat,                No sneezing, itching, ear ache, nasal congestion, post nasal drip,   CV:  No chest pain,  Orthopnea, PND, swelling in lower extremities, anasarca, dizziness, palpitations  GI  No heartburn, indigestion, abdominal pain, nausea, vomiting, diarrhea, change in bowel habits, loss of appetite  Resp: No shortness of breath with exertion or at rest.  No excess mucus, no productive cough,  No non-productive cough,  No coughing up of blood.  No change in color of mucus.  No wheezing.  No chest wall deformity  Skin: no rash or lesions.  GU: no dysuria, change in color of urine, no urgency or frequency.  No flank pain.  MS:  No joint pain or swelling.  No decreased range of motion.  No back pain.  Psych:  No change in mood or affect. No depression or anxiety.  No memory loss.     Objective:   Physical Exam  Filed Vitals:   10/16/11 0901  BP: 138/70  Pulse: 79  Temp: 97.9 F (36.6 C)  TempSrc: Oral  Height: 5\' 5"  (1.651 m)  Weight: 74.118 kg (163 lb 6.4 oz)  SpO2: 97%    Gen: Pleasant, well-nourished, in no distress,  normal affect  ENT: No lesions,  mouth clear,  oropharynx clear, no postnasal drip  Neck: No JVD, no TMG, no carotid bruits  Lungs: No use of accessory  muscles, no  dullness to percussion, expired wheeze  Cardiovascular: RRR, heart sounds normal, no murmur or gallops, no peripheral edema  Abdomen: soft and NT, no HSM,  BS normal  Musculoskeletal: No deformities, no cyanosis or clubbing  Neuro: alert, non focal  Skin: Warm, no lesions or rashes        Assessment & Plan:   ASTHMA, EXTRINSIC Moderate persistent asthma with poor HFA technique.   Plan  work on [facility] technique. Pulse prednisone     Updated Medication List Outpatient Encounter Prescriptions as of 10/16/2011  Medication Sig Dispense Refill  . albuterol (PROAIR HFA) 108 (90 BASE) MCG/ACT inhaler Inhale 2 puffs into the lungs every 6 (six) hours as needed.  3 Inhaler  3  . budesonide-formoterol (SYMBICORT) 160-4.5 MCG/ACT inhaler Inhale 2 puffs into the lungs 2 (two) times daily.  3 Inhaler  3  . Calcium-Vitamin D (SUPER CALCIUM/D) 600-125 MG-UNIT TABS Take by mouth daily.       . cetirizine (ZYRTEC) 10 MG tablet Take 10 mg by mouth daily.        . Cholecalciferol (VITAMIN D3) 1000 UNITS CAPS Take by mouth daily.        . clonazePAM (KLONOPIN) 0.5 MG tablet Take 1 tablet (0.5 mg total) by mouth daily as needed.  30 tablet  1  . Docusate Sodium (STOOL SOFTENER) 100 MG capsule Take 100 mg by mouth daily.        . ferrous sulfate (IRON SUPPLEMENT) 325 (65 FE) MG tablet Take 325 mg by mouth daily.       . fluticasone (FLONASE) 50 MCG/ACT nasal spray Place 2 sprays into the nose daily.  3 g  3  . metaxalone (SKELAXIN) 800 MG tablet Take 1 tablet (800 mg total) by mouth daily as needed.  90 tablet  1  . montelukast (SINGULAIR) 10 MG tablet Take 1 tablet (10 mg total) by mouth at bedtime.  90 tablet  3  . Multiple Vitamin (MULTIVITAMIN) tablet Take 1 tablet by mouth daily.        Marland Kitchen omeprazole (PRILOSEC) 20 MG capsule Take 1 capsule (20 mg total) by mouth daily.  90 capsule  3  . PARoxetine (PAXIL) 20 MG tablet Take 1 tablet (20 mg total) by mouth daily.  90 tablet  1  . pioglitazone  (ACTOS) 30 MG tablet Take 0.5 tablets (15 mg total) by mouth daily.  90 tablet  2  . triamcinolone cream (KENALOG) 0.1 % Apply topically 2 (two) times daily as needed.      . valACYclovir (VALTREX) 1000 MG tablet Take 1,000 mg by mouth 2 (two) times daily as needed.      . valsartan (DIOVAN) 160 MG tablet Take 1 tablet (160 mg total) by mouth daily.  90 tablet  3  . predniSONE (DELTASONE) 10 MG tablet Take 4 tablets daily for 5 days then stop  20 tablet  0  . DISCONTD: predniSONE (DELTASONE) 10 MG tablet Take 4 for two days three for two days two for two days one for two days  20 tablet  0

## 2011-10-16 NOTE — Patient Instructions (Addendum)
Work on inhaler technique Prednisone 10mg  Take 4 tablets daily for 5 days then stop Return 3 months

## 2011-10-22 DIAGNOSIS — R350 Frequency of micturition: Secondary | ICD-10-CM | POA: Diagnosis not present

## 2011-10-22 DIAGNOSIS — N3000 Acute cystitis without hematuria: Secondary | ICD-10-CM | POA: Diagnosis not present

## 2011-11-19 ENCOUNTER — Encounter: Payer: Self-pay | Admitting: Internal Medicine

## 2011-11-19 ENCOUNTER — Other Ambulatory Visit (INDEPENDENT_AMBULATORY_CARE_PROVIDER_SITE_OTHER): Payer: Medicare Other

## 2011-11-19 ENCOUNTER — Ambulatory Visit (INDEPENDENT_AMBULATORY_CARE_PROVIDER_SITE_OTHER): Payer: Medicare Other | Admitting: Internal Medicine

## 2011-11-19 VITALS — BP 120/82 | HR 82 | Temp 97.4°F | Ht 65.5 in | Wt 165.0 lb

## 2011-11-19 DIAGNOSIS — J45909 Unspecified asthma, uncomplicated: Secondary | ICD-10-CM | POA: Diagnosis not present

## 2011-11-19 DIAGNOSIS — E119 Type 2 diabetes mellitus without complications: Secondary | ICD-10-CM

## 2011-11-19 DIAGNOSIS — IMO0001 Reserved for inherently not codable concepts without codable children: Secondary | ICD-10-CM

## 2011-11-19 DIAGNOSIS — E785 Hyperlipidemia, unspecified: Secondary | ICD-10-CM | POA: Diagnosis not present

## 2011-11-19 MED ORDER — PREGABALIN 25 MG PO CAPS: 25.0000 mg | ORAL_CAPSULE | Freq: Three times a day (TID) | ORAL | Status: DC

## 2011-11-19 MED ORDER — ROSUVASTATIN CALCIUM 20 MG PO TABS: 20.0000 mg | ORAL_TABLET | Freq: Every day | ORAL | Status: DC

## 2011-11-19 MED ORDER — MELOXICAM 15 MG PO TABS: 15.0000 mg | ORAL_TABLET | Freq: Every day | ORAL | Status: DC

## 2011-11-19 MED ORDER — CLONAZEPAM 0.5 MG PO TABS: 0.5000 mg | ORAL_TABLET | Freq: Every day | ORAL | Status: DC | PRN

## 2011-11-19 MED ORDER — PAROXETINE HCL 20 MG PO TABS: 20.0000 mg | ORAL_TABLET | Freq: Every day | ORAL | Status: DC

## 2011-11-19 MED ORDER — VALSARTAN 160 MG PO TABS: 160.0000 mg | ORAL_TABLET | Freq: Every day | ORAL | Status: DC

## 2011-11-19 MED ORDER — METAXALONE 800 MG PO TABS: 800.0000 mg | ORAL_TABLET | Freq: Every day | ORAL | Status: DC | PRN

## 2011-11-19 MED ORDER — PIOGLITAZONE HCL 30 MG PO TABS: 15.0000 mg | ORAL_TABLET | Freq: Every day | ORAL | Status: DC

## 2011-11-19 MED ORDER — OMEPRAZOLE 20 MG PO CPDR: 20.0000 mg | DELAYED_RELEASE_CAPSULE | Freq: Every day | ORAL | Status: DC

## 2011-11-19 NOTE — Assessment & Plan Note (Signed)
Stable mod persist asthma, recent flares summer 2013 reviewed LABA and inhaled steroid - follows with pulm for same

## 2011-11-19 NOTE — Assessment & Plan Note (Signed)
Prev rx'd simva 05/2010 - caused leg pains Changed to crestor 20 every other day 08/2010 - non compliant due to side effects Reviewed last lipids from last month No new rx change recommended today

## 2011-11-19 NOTE — Assessment & Plan Note (Signed)
Intolerant of metformin due to diarrhea Continue actos - check a1c and titrate if needed On ARB and statin, regular eye exams The current medical regimen is effective;  continue present plan and medications.  Lab Results  Component Value Date   HGBA1C 6.0 04/12/2011

## 2011-11-19 NOTE — Patient Instructions (Signed)
It was good to see you today. We have reviewed your interval history and records including labs and tests today Test(s) ordered today. Your results will be called to you after review (48-72hours after test completion). If any changes need to be made, you will be notified at that time. Medications reviewed, add Lyrica for fibromyalgia as discussed - no other changes at this time. Your prescription(s) have been submitted to your local and mail order pharmacy. Please take as directed and contact our office if you believe you are having problem(s) with the medication(s). Please schedule followup in 6 months, call sooner if problems.

## 2011-11-19 NOTE — Progress Notes (Signed)
Subjective:    Patient ID: [name], female    DOB: 03-08-1934, 78 y.o.   MRN: [id number]  HPI  Here for follow up - reviewed chronic medical issues:  anxiety and depression - stable despite family stressors (mother's death, sister's dementia and other family illness). no sadness/SI/HI but increase in chronic poor sleep issues - last flare depression spring 2012 precipitated by stress (illness and hosp/rehab of spouse) - paxil dose increased 06/2010 - improved, no SI/HI - also using prn clonazepam for same with good relief -  DM2 - intol of metformin due to side effects - on actos without change since that time. does not check cbgs at home. Occasionally "shaky and sweaty" before meals (1-2x/wk) -?hypoglycemia  dyslipidemia - prev on simva (stopped 01/2010 due to generalized myalgias) - no significant improvement in symptoms off med and concerned about uncontrolled chol off tx - tried to resume simva 06/2010 but recurrent myalgia  - in 08/2010, began crestor at qod dose but stopped 08/2011 due to myalgias  fibromyalgia - increase in diffuse pain with increase emotional stress - ?try Lyrica  Past Medical History  Diagnosis Date  . ARTHRITIS     s/p bilateral THR  . MIGRAINE HEADACHE   . Interstitial cystitis   . ALLERGIC RHINITIS   . ANEMIA, IRON DEFICIENCY   . ANXIETY   . ASTHMA, EXTRINSIC   . HYPERTENSION   . HYPERLIPIDEMIA   . G E R D   . DIABETES MELLITUS, TYPE II   . FIBROMYALGIA     fibromyalgia  . Chronic constipation     Review of Systems  Constitutional: Positive for fatigue. Negative for fever and unexpected weight change.  Respiratory: Negative for cough and shortness of breath.   Cardiovascular: Negative for chest pain and palpitations.  Neurological: Negative for weakness and headaches.       Objective:   Physical Exam  BP 120/82  Pulse 82  Temp 97.4 F (36.3 C) (Oral)  Ht 5' 5.5" (1.664 m)  Wt 165 lb (74.844 kg)  BMI 27.04 kg/m2  SpO2 95% Wt  Readings from Last 3 Encounters:  11/19/11 165 lb (74.844 kg)  10/16/11 163 lb 6.4 oz (74.118 kg)  10/04/11 164 lb 12.8 oz (74.753 kg)   Constitutional: She appears well-developed and well-nourished. No distress. Spouse at side Neck: Normal range of motion. Neck supple. No JVD or LAD present. No thyromegaly present.  Cardiovascular: Normal rate, regular rhythm and normal heart sounds.  No murmur heard. No BLE edema. Pulmonary/Chest: Effort normal and breath sounds normal. No respiratory distress. She has no wheezes.  Psychiatric: She has a normal mood and affect. Her behavior is normal. Judgment and thought content normal.    Lab Results  Component Value Date   WBC 6.1 05/20/2011   HGB 12.6 05/20/2011   HCT 37.5 05/20/2011   PLT 156 05/20/2011   CHOL 298* 04/12/2011   TRIG 311.0* 04/12/2011   HDL 58.80 04/12/2011   LDLDIRECT 195.1 04/12/2011   ALT 20 05/20/2011   AST 23 05/20/2011   NA 142 05/20/2011   K 4.0 05/20/2011   CL 105 05/20/2011   CREATININE 0.90 05/20/2011   BUN 14 05/20/2011   CO2 27 05/20/2011   TSH 1.67 04/12/2011   INR 0.9 04/12/2011   HGBA1C 6.0 04/12/2011        Assessment & Plan:   see problem list. Medications and labs reviewed today.  Time spent with pt today 25 minutes, greater than 50% time  spent counseling patient on diabetes, depression/anxiety/stressors and lab-medication review. Also review of interval records from other providers

## 2011-11-19 NOTE — Assessment & Plan Note (Signed)
Dx 1998 - exac by emotional stressors and weather Prior treatment with amitriptyline poorly tolerated due to side effects  Will add Lyrica tid to ongoing paxil/clonazeam - consider cymbalta in place of paxil if lyrica poorly tolerated

## 2011-11-29 ENCOUNTER — Ambulatory Visit: Payer: Medicare Other | Admitting: Critical Care Medicine

## 2011-12-05 DIAGNOSIS — M12269 Villonodular synovitis (pigmented), unspecified knee: Secondary | ICD-10-CM | POA: Diagnosis not present

## 2011-12-05 DIAGNOSIS — I8310 Varicose veins of unspecified lower extremity with inflammation: Secondary | ICD-10-CM | POA: Diagnosis not present

## 2011-12-20 ENCOUNTER — Other Ambulatory Visit: Payer: Self-pay | Admitting: Orthopedic Surgery

## 2011-12-20 DIAGNOSIS — M5137 Other intervertebral disc degeneration, lumbosacral region: Secondary | ICD-10-CM | POA: Diagnosis not present

## 2011-12-20 DIAGNOSIS — M5136 Other intervertebral disc degeneration, lumbar region: Secondary | ICD-10-CM

## 2011-12-30 ENCOUNTER — Ambulatory Visit
Admission: RE | Admit: 2011-12-30 | Discharge: 2011-12-30 | Disposition: A | Discharge status: Home / Self Care | Payer: Medicare Other | Source: Ambulatory Visit | Attending: Orthopedic Surgery | Admitting: Orthopedic Surgery

## 2011-12-30 VITALS — BP 146/63 | HR 64

## 2011-12-30 DIAGNOSIS — M5136 Other intervertebral disc degeneration, lumbar region: Secondary | ICD-10-CM

## 2011-12-30 DIAGNOSIS — M5126 Other intervertebral disc displacement, lumbar region: Secondary | ICD-10-CM | POA: Diagnosis not present

## 2011-12-30 DIAGNOSIS — M48061 Spinal stenosis, lumbar region without neurogenic claudication: Secondary | ICD-10-CM | POA: Diagnosis not present

## 2011-12-30 MED ORDER — IOHEXOL 180 MG/ML  SOLN
15.0000 mL | Freq: Once | INTRAMUSCULAR | Status: AC | PRN
  Administered 2011-12-30: 15 mL via INTRATHECAL

## 2011-12-30 MED ORDER — DIAZEPAM 5 MG PO TABS
10.0000 mg | ORAL_TABLET | Freq: Once | ORAL | Status: AC
  Administered 2011-12-30: 5 mg via ORAL

## 2011-12-30 NOTE — Progress Notes (Signed)
Patient states she has been off Paroxetine for the past two days.  Donell Sievert, RN

## 2012-01-02 DIAGNOSIS — M5137 Other intervertebral disc degeneration, lumbosacral region: Secondary | ICD-10-CM | POA: Diagnosis not present

## 2012-01-03 DIAGNOSIS — Z23 Encounter for immunization: Secondary | ICD-10-CM | POA: Diagnosis not present

## 2012-01-06 ENCOUNTER — Ambulatory Visit (INDEPENDENT_AMBULATORY_CARE_PROVIDER_SITE_OTHER): Payer: Medicare Other | Admitting: Critical Care Medicine

## 2012-01-06 ENCOUNTER — Encounter: Payer: Self-pay | Admitting: Critical Care Medicine

## 2012-01-06 ENCOUNTER — Telehealth: Payer: Self-pay | Admitting: Internal Medicine

## 2012-01-06 VITALS — BP 140/88 | HR 80 | Temp 97.7°F | Ht 65.0 in | Wt 166.6 lb

## 2012-01-06 DIAGNOSIS — M5416 Radiculopathy, lumbar region: Secondary | ICD-10-CM

## 2012-01-06 DIAGNOSIS — J45909 Unspecified asthma, uncomplicated: Secondary | ICD-10-CM | POA: Diagnosis not present

## 2012-01-06 NOTE — Patient Instructions (Addendum)
No change in medications. Return in         4 months 

## 2012-01-06 NOTE — Progress Notes (Signed)
Subjective:    Patient ID: [name], female    DOB: Mar 16, 1934, 78 y.o.   MRN: [id number]  Asthma Her past medical history is significant for asthma.   78 y.o.    female who presents with asthma with reflux disease   10/04/2011 Pt noting more tightness in chest and difficulty in deep breathing. Has a sister in [place] and went to visit for three days .  No A/C and damp and not clean.   No real mucus.    10/16/2011 Just finished pred taper and is unimproved.  Pt notes still tight in chest.  Pt notes dry cough. No heartburn. Pt notes some pndrip.  No mucus.  No regurg or dysphagia. Pt notes a headache. No wheezing.  Pt remains very dyspneic with exertion.  No qhs dyspnea  01/06/2012 At last OV was unimproved d/t poor HFA technique and so got another round of pred. Now using inhaler better and has helped.  Now no real cough. No chest pain.  No wheeze Notes pndrip. Recently Dx Lumbar 3-4-5 disk rupture.  Ramos to perform an injection. Pt denies any significant sore throat, nasal congestion or excess secretions, fever, chills, sweats, unintended weight loss, pleurtic or exertional chest pain, orthopnea PND, or leg swelling Pt denies any increase in rescue therapy over baseline, denies waking up needing it or having any early am or nocturnal exacerbations of coughing/wheezing/or dyspnea. Pt also denies any obvious fluctuation in symptoms with  weather or environmental change or other alleviating or aggravating factors     PUL ASTHMA HISTORY 01/06/2012 10/16/2011 10/04/2011 05/03/2011 01/01/2011  Symptoms 0-2 days/week Daily Daily 0-2 days/week 0-2 days/week  Nighttime awakenings 0-2/month 0-2/month 0-2/month 0-2/month 0-2/month  Interference with activity No limitations Some limitations Some limitations No limitations No limitations  SABA use 0-2 days/wk Daily Daily 0-2 days/wk 0-2 days/wk  Exacerbations requiring oral steroids 0-1 / year 0-1 / year 0-1 / year 0-1 / year 0-1 / year    Past Medical History  Diagnosis Date  . ARTHRITIS     s/p bilateral THR  . MIGRAINE HEADACHE   . Interstitial cystitis   . ALLERGIC RHINITIS   . ANEMIA, IRON DEFICIENCY   . ANXIETY   . ASTHMA, EXTRINSIC   . HYPERTENSION   . HYPERLIPIDEMIA   . G E R D   . DIABETES MELLITUS, TYPE II   . FIBROMYALGIA     fibromyalgia  . Chronic constipation      Family History  Problem Relation Age of Onset  . Arthritis Mother   . Hypertension Mother   . Heart disease Father   . Arthritis Father   . Hypertension Father   . Depression Sister   . Hyperlipidemia Sister   . Hypertension Other   . Hyperlipidemia Other      History   Social History  . Marital Status: Married    Spouse Name: N/A    Number of Children: N/A  . Years of Education: N/A   Occupational History  . Not on file.   Social History Main Topics  . Smoking status: Former Smoker -- 0.3 packs/day for 10 years    Types: Cigarettes    Quit date: 03/25/1968  . Smokeless tobacco: Never Used   Comment: Married with two children. Retired Scientist, clinical (histocompatibility and immunogenetics)  . Alcohol Use: No  . Drug Use: No  . Sexually Active: Not on file   Other Topics Concern  . Not on file   Social History Narrative  .  No narrative on file     Allergies  Allergen Reactions  . Morphine Sulfate Other (See Comments)    Makes her hyper     Outpatient Prescriptions Prior to Visit  Medication Sig Dispense Refill  . albuterol (PROAIR HFA) 108 (90 BASE) MCG/ACT inhaler Inhale 2 puffs into the lungs every 6 (six) hours as needed.  3 Inhaler  3  . budesonide-formoterol (SYMBICORT) 160-4.5 MCG/ACT inhaler Inhale 2 puffs into the lungs 2 (two) times daily.  3 Inhaler  3  . Calcium-Vitamin D (SUPER CALCIUM/D) 600-125 MG-UNIT TABS Take by mouth daily.       . cetirizine (ZYRTEC) 10 MG tablet Take 10 mg by mouth daily.        . Cholecalciferol (VITAMIN D3) 1000 UNITS CAPS Take by mouth daily.        . clonazePAM (KLONOPIN) 0.5 MG tablet Take 1 tablet (0.5 mg  total) by mouth daily as needed.  90 tablet  0  . Docusate Sodium (STOOL SOFTENER) 100 MG capsule Take 100 mg by mouth daily.        . ferrous sulfate (IRON SUPPLEMENT) 325 (65 FE) MG tablet Take 325 mg by mouth daily.       . fluticasone (FLONASE) 50 MCG/ACT nasal spray Place 2 sprays into the nose daily.  3 g  3  . meloxicam (MOBIC) 15 MG tablet Take 1 tablet (15 mg total) by mouth daily.  90 tablet  1  . metaxalone (SKELAXIN) 800 MG tablet Take 1 tablet (800 mg total) by mouth daily as needed.  90 tablet  1  . montelukast (SINGULAIR) 10 MG tablet Take 1 tablet (10 mg total) by mouth at bedtime.  90 tablet  3  . Multiple Vitamin (MULTIVITAMIN) tablet Take 1 tablet by mouth daily.        Marland Kitchen omeprazole (PRILOSEC) 20 MG capsule Take 1 capsule (20 mg total) by mouth daily.  90 capsule  3  . pioglitazone (ACTOS) 30 MG tablet Take 0.5 tablets (15 mg total) by mouth daily.  45 tablet  3  . rosuvastatin (CRESTOR) 20 MG tablet Take 1 tablet (20 mg total) by mouth daily. On HOLD since 08/2011  90 tablet  3  . valACYclovir (VALTREX) 1000 MG tablet Take 1,000 mg by mouth 2 (two) times daily as needed.      . valsartan (DIOVAN) 160 MG tablet Take 1 tablet (160 mg total) by mouth daily.  90 tablet  3  . PARoxetine (PAXIL) 20 MG tablet Take 1 tablet (20 mg total) by mouth daily.  90 tablet  3  . pregabalin (LYRICA) 25 MG capsule Take 1 capsule (25 mg total) by mouth 3 (three) times daily.  90 capsule  1  . triamcinolone cream (KENALOG) 0.1 % Apply topically 2 (two) times daily as needed.         Review of Systems  Constitutional:   No  weight loss, night sweats,  Fevers, chills, fatigue, lassitude. HEENT:   No headaches,  Difficulty swallowing,  Tooth/dental problems,  Sore throat,                No sneezing, itching, ear ache, nasal congestion, post nasal drip,   CV:  No chest pain,  Orthopnea, PND, swelling in lower extremities, anasarca, dizziness, palpitations  GI  No heartburn, indigestion,  abdominal pain, nausea, vomiting, diarrhea, change in bowel habits, loss of appetite  Resp: No shortness of breath with exertion or at rest.  No excess mucus,  no productive cough,  No non-productive cough,  No coughing up of blood.  No change in color of mucus.  No wheezing.  No chest wall deformity  Skin: no rash or lesions.  GU: no dysuria, change in color of urine, no urgency or frequency.  No flank pain.  MS:  No joint pain or swelling.  No decreased range of motion.  No back pain.  Psych:  No change in mood or affect. No depression or anxiety.  No memory loss.     Objective:   Physical Exam  Filed Vitals:   01/06/12 1520  BP: 140/88  Pulse: 80  Temp: 97.7 F (36.5 C)  TempSrc: Oral  Height: 5\' 5"  (1.651 m)  Weight: 166 lb 9.6 oz (75.569 kg)  SpO2: 97%    Gen: Pleasant, well-nourished, in no distress,  normal affect  ENT: No lesions,  mouth clear,  oropharynx clear, no postnasal drip  Neck: No JVD, no TMG, no carotid bruits  Lungs: No use of accessory muscles, no dullness to percussion, expired wheeze  Cardiovascular: RRR, heart sounds normal, no murmur or gallops, no peripheral edema  Abdomen: soft and NT, no HSM,  BS normal  Musculoskeletal: No deformities, no cyanosis or clubbing  Neuro: alert, non focal  Skin: Warm, no lesions or rashes        Assessment & Plan:   ASTHMA, EXTRINSIC Moderate persistent asthma with significant atopic features stable now with recent prednisone pulse and taper Plan Maintain inhaled medications as prescribed   Note patient's HFA technique is a major issue at the last visit and she is improved from 10-20% to 70% with coaching   this is a major improvement in her lung function didn't improve inhaler technique Updated Medication List Outpatient Encounter Prescriptions as of 01/06/2012  Medication Sig Dispense Refill  . albuterol (PROAIR HFA) 108 (90 BASE) MCG/ACT inhaler Inhale 2 puffs into the lungs every 6 (six) hours  as needed.  3 Inhaler  3  . budesonide-formoterol (SYMBICORT) 160-4.5 MCG/ACT inhaler Inhale 2 puffs into the lungs 2 (two) times daily.  3 Inhaler  3  . Calcium-Vitamin D (SUPER CALCIUM/D) 600-125 MG-UNIT TABS Take by mouth daily.       . cetirizine (ZYRTEC) 10 MG tablet Take 10 mg by mouth daily.        . Cholecalciferol (VITAMIN D3) 1000 UNITS CAPS Take by mouth daily.        . clonazePAM (KLONOPIN) 0.5 MG tablet Take 1 tablet (0.5 mg total) by mouth daily as needed.  90 tablet  0  . Docusate Sodium (STOOL SOFTENER) 100 MG capsule Take 100 mg by mouth daily.        . ferrous sulfate (IRON SUPPLEMENT) 325 (65 FE) MG tablet Take 325 mg by mouth daily.       . fluticasone (FLONASE) 50 MCG/ACT nasal spray Place 2 sprays into the nose daily.  3 g  3  . HYDROcodone-acetaminophen (NORCO/VICODIN) 5-325 MG per tablet as needed.      . meloxicam (MOBIC) 15 MG tablet Take 1 tablet (15 mg total) by mouth daily.  90 tablet  1  . metaxalone (SKELAXIN) 800 MG tablet Take 1 tablet (800 mg total) by mouth daily as needed.  90 tablet  1  . montelukast (SINGULAIR) 10 MG tablet Take 1 tablet (10 mg total) by mouth at bedtime.  90 tablet  3  . Multiple Vitamin (MULTIVITAMIN) tablet Take 1 tablet by mouth daily.        Marland Kitchen  omeprazole (PRILOSEC) 20 MG capsule Take 1 capsule (20 mg total) by mouth daily.  90 capsule  3  . pioglitazone (ACTOS) 30 MG tablet Take 0.5 tablets (15 mg total) by mouth daily.  45 tablet  3  . rosuvastatin (CRESTOR) 20 MG tablet Take 1 tablet (20 mg total) by mouth daily. On HOLD since 08/2011  90 tablet  3  . traMADol (ULTRAM) 50 MG tablet Take 1 tablet by mouth as needed.      . valACYclovir (VALTREX) 1000 MG tablet Take 1,000 mg by mouth 2 (two) times daily as needed.      . valsartan (DIOVAN) 160 MG tablet Take 1 tablet (160 mg total) by mouth daily.  90 tablet  3  . PARoxetine (PAXIL) 20 MG tablet ON HOLD      . pregabalin (LYRICA) 25 MG capsule ON HOLD      . DISCONTD: PARoxetine (PAXIL)  20 MG tablet Take 1 tablet (20 mg total) by mouth daily.  90 tablet  3  . DISCONTD: pregabalin (LYRICA) 25 MG capsule Take 1 capsule (25 mg total) by mouth 3 (three) times daily.  90 capsule  1  . DISCONTD: triamcinolone cream (KENALOG) 0.1 % Apply topically 2 (two) times daily as needed.

## 2012-01-06 NOTE — Telephone Encounter (Signed)
Request referral to Neurology Dr Barnett Abu for back problems

## 2012-01-07 NOTE — Assessment & Plan Note (Signed)
Moderate persistent asthma with significant atopic features stable now with recent prednisone pulse and taper Plan Maintain inhaled medications as prescribed

## 2012-01-07 NOTE — Telephone Encounter (Signed)
done

## 2012-01-08 DIAGNOSIS — M5137 Other intervertebral disc degeneration, lumbosacral region: Secondary | ICD-10-CM | POA: Diagnosis not present

## 2012-01-13 DIAGNOSIS — IMO0002 Reserved for concepts with insufficient information to code with codable children: Secondary | ICD-10-CM | POA: Diagnosis not present

## 2012-01-13 DIAGNOSIS — M48061 Spinal stenosis, lumbar region without neurogenic claudication: Secondary | ICD-10-CM | POA: Diagnosis not present

## 2012-01-24 DIAGNOSIS — M5137 Other intervertebral disc degeneration, lumbosacral region: Secondary | ICD-10-CM | POA: Diagnosis not present

## 2012-01-24 DIAGNOSIS — M961 Postlaminectomy syndrome, not elsewhere classified: Secondary | ICD-10-CM | POA: Diagnosis not present

## 2012-01-28 DIAGNOSIS — M5137 Other intervertebral disc degeneration, lumbosacral region: Secondary | ICD-10-CM | POA: Diagnosis not present

## 2012-01-31 DIAGNOSIS — E782 Mixed hyperlipidemia: Secondary | ICD-10-CM | POA: Diagnosis not present

## 2012-01-31 DIAGNOSIS — I10 Essential (primary) hypertension: Secondary | ICD-10-CM | POA: Diagnosis not present

## 2012-01-31 DIAGNOSIS — R9431 Abnormal electrocardiogram [ECG] [EKG]: Secondary | ICD-10-CM | POA: Diagnosis not present

## 2012-01-31 DIAGNOSIS — E119 Type 2 diabetes mellitus without complications: Secondary | ICD-10-CM | POA: Diagnosis not present

## 2012-02-10 ENCOUNTER — Ambulatory Visit (INDEPENDENT_AMBULATORY_CARE_PROVIDER_SITE_OTHER): Payer: Medicare Other | Admitting: Internal Medicine

## 2012-02-10 ENCOUNTER — Encounter: Payer: Self-pay | Admitting: Internal Medicine

## 2012-02-10 VITALS — BP 122/80 | HR 82 | Temp 97.0°F | Ht 65.5 in | Wt 166.6 lb

## 2012-02-10 DIAGNOSIS — E785 Hyperlipidemia, unspecified: Secondary | ICD-10-CM | POA: Diagnosis not present

## 2012-02-10 DIAGNOSIS — IMO0002 Reserved for concepts with insufficient information to code with codable children: Secondary | ICD-10-CM | POA: Diagnosis not present

## 2012-02-10 DIAGNOSIS — M5416 Radiculopathy, lumbar region: Secondary | ICD-10-CM

## 2012-02-10 DIAGNOSIS — B029 Zoster without complications: Secondary | ICD-10-CM | POA: Diagnosis not present

## 2012-02-10 DIAGNOSIS — M5137 Other intervertebral disc degeneration, lumbosacral region: Secondary | ICD-10-CM | POA: Diagnosis not present

## 2012-02-10 MED ORDER — VALACYCLOVIR HCL 1 G PO TABS: 1000.0000 mg | ORAL_TABLET | Freq: Three times a day (TID) | ORAL | Status: DC

## 2012-02-10 NOTE — Patient Instructions (Signed)
It was good to see you today. Valtrex 3x/day x 1 week - Your prescription(s) have been given to you to submit to your pharmacy. Please take as directed and contact our office if you believe you are having problem(s) with the medication(s). follow up with Dr Dione Booze as planned and call if problems Ok for shingles shot once this episode resolves

## 2012-02-10 NOTE — Progress Notes (Signed)
  Subjective:    Patient ID: [name], female    DOB: 1933-07-06, 78 y.o.   MRN: [id number]  HPI  ?shingles - Hx same, currently affecting L upper eyelid and L scalp  Past Medical History  Diagnosis Date  . ARTHRITIS     s/p bilateral THR  . MIGRAINE HEADACHE   . Interstitial cystitis   . ALLERGIC RHINITIS   . ANEMIA, IRON DEFICIENCY   . ANXIETY   . ASTHMA, EXTRINSIC   . HYPERTENSION   . HYPERLIPIDEMIA   . G E R D   . DIABETES MELLITUS, TYPE II   . FIBROMYALGIA     fibromyalgia  . Chronic constipation    Review of Systems  Constitutional: Negative for fever and fatigue.  Eyes: Negative for redness and visual disturbance.       Objective:   Physical Exam BP 122/80  Pulse 82  Temp 97 F (36.1 C) (Oral)  Ht 5' 5.5" (1.664 m)  Wt 166 lb 9.6 oz (75.569 kg)  BMI 27.30 kg/m2  SpO2 98% Wt Readings from Last 3 Encounters:  02/10/12 166 lb 9.6 oz (75.569 kg)  01/06/12 166 lb 9.6 oz (75.569 kg)  11/19/11 165 lb (74.844 kg)   Constitutional: She appears well-developed and well-nourished. No distress.  HENT: Head: small vesicles on L side of scalp -Normocephalic and atraumatic. Ears: B TMs ok, no erythema or effusion; Nose: Nose normal. Mouth/Throat: Oropharynx is clear and moist. No oropharyngeal exudate.  Eyes: mild swelling and edema of upper L eyelid without vesicles -Conjunctivae and EOM are normal. Pupils are equal, round, and reactive to light. No scleral icterus.  Neck: Normal range of motion. Neck supple. No JVD present. No thyromegaly present.  Cardiovascular: Normal rate, regular rhythm and normal heart sounds.  No murmur heard. No BLE edema. Pulmonary/Chest: Effort normal and breath sounds normal. No respiratory distress. She has no wheezes.  Neurological: She is alert and oriented to person, place, and time. No cranial nerve deficit. Coordination normal.  Skin: see above -Skin is warm and dry. No rash noted. No erythema.   Lab Results  Component Value  Date   WBC 6.1 05/20/2011   HGB 12.6 05/20/2011   HCT 37.5 05/20/2011   PLT 156 05/20/2011   GLUCOSE 110* 05/20/2011   CHOL 298* 04/12/2011   TRIG 311.0* 04/12/2011   HDL 58.80 04/12/2011   LDLDIRECT 195.1 04/12/2011   ALT 20 05/20/2011   AST 23 05/20/2011   NA 142 05/20/2011   K 4.0 05/20/2011   CL 105 05/20/2011   CREATININE 0.90 05/20/2011   BUN 14 05/20/2011   CO2 27 05/20/2011   TSH 1.67 04/12/2011   INR 0.9 04/12/2011   HGBA1C 5.9 11/19/2011      Assessment & Plan:   Recurrent shingles - not classic vesicles on forehead/eyelid but scalp affected - describes neuropathy sensation in same distribution - tx with valtrex and follow up with optho in 48h as scheduled (groat)  RLE lumbar radiculopathy symptoms - interval hx reviewed -working with Geographical information systems officer (ortho), Ramos and [facility]) on same - some improvement s/p "injection" at [employer] office x 1 - ?benefit of additional injections vs surgery - advised pt on same and encouraged to maximize less invasive therapy before surgery so long as finding some benefit from injections

## 2012-02-10 NOTE — Assessment & Plan Note (Signed)
Resumed simva 05/2010 - caused leg pains Changed to crestor 20 every other day 08/2010 -  minimally compliant due to side effects, but resumed regular effort at compliance with same fall 2013 Reviewed last lipids - No new rx change recommended today  

## 2012-02-12 DIAGNOSIS — B0239 Other herpes zoster eye disease: Secondary | ICD-10-CM | POA: Diagnosis not present

## 2012-02-12 DIAGNOSIS — H40019 Open angle with borderline findings, low risk, unspecified eye: Secondary | ICD-10-CM | POA: Diagnosis not present

## 2012-02-12 DIAGNOSIS — Z961 Presence of intraocular lens: Secondary | ICD-10-CM | POA: Diagnosis not present

## 2012-02-25 ENCOUNTER — Telehealth: Payer: Self-pay | Admitting: Internal Medicine

## 2012-02-25 NOTE — Telephone Encounter (Signed)
Patient informed. 

## 2012-02-25 NOTE — Telephone Encounter (Signed)
No further tx for the shingles itself is needed and will cont to heal over the next few wks  If the pain is significant to her, or persists in the next few wks, this would likely be the PHN  (post herpetic neuralgia) and might need tx such as gabapentin

## 2012-02-25 NOTE — Telephone Encounter (Signed)
Patient Information:  Caller Name: [name]  Phone: [phone]  Patient: [name]  Gender: Female  DOB: 1933/08/26  Age: 78 Years  PCP: Rene Paci (Adults only)   Symptoms  Reason For Call & Symptoms: Shingles still present after treatment completed  Reviewed Health History In EMR: Yes  Reviewed Medications In EMR: Yes  Reviewed Allergies In EMR: Yes  Reviewed Surgeries / Procedures: Yes  Date of Onset of Symptoms: 02/02/2012  Treatments Tried: Valtrex  Treatments Tried Worked: Yes  Guideline(s) Used:  No Protocol Available - Sick Adult  Disposition Per Guideline:   Discuss with PCP and Callback by Nurse within 1 Hour  Reason For Disposition Reached:   Nursing judgment  Advice Given:  Call Back If:  New symptoms develop  You become worse.  Office Follow Up:  Does the office need to follow up with this patient?: Yes  Instructions For The Office: Patient has completed medication prescribed for Shingles as of last week.  Shingles to face and left eye have dried up however is still having itching and pain with touch to scalp area.  See nurses note and if any additional follow up needed please follow up with patient with further instructions.  RN Note:  Itchy, no drainage to lesions, tiny pimple like.  Still present to left of eye and forehead area.  States is drying up on left head and left eye area. Area in scalp are drying but are not gone.  Still painful if touched 8/10.  Reviewed Health eduction with patient to inform that even after treatment that it can take a few months for symptoms to completely subside.  Patient would like to know if further medication treatment is needed.

## 2012-03-13 ENCOUNTER — Ambulatory Visit (INDEPENDENT_AMBULATORY_CARE_PROVIDER_SITE_OTHER): Payer: Medicare Other | Admitting: Critical Care Medicine

## 2012-03-13 ENCOUNTER — Encounter: Payer: Self-pay | Admitting: Critical Care Medicine

## 2012-03-13 VITALS — BP 136/94 | HR 75 | Temp 98.1°F | Ht 65.0 in | Wt 163.0 lb

## 2012-03-13 DIAGNOSIS — J019 Acute sinusitis, unspecified: Secondary | ICD-10-CM

## 2012-03-13 DIAGNOSIS — J45909 Unspecified asthma, uncomplicated: Secondary | ICD-10-CM

## 2012-03-13 MED ORDER — AMOXICILLIN-POT CLAVULANATE 875-125 MG PO TABS: 1.0000 | ORAL_TABLET | Freq: Two times a day (BID) | ORAL | Status: DC

## 2012-03-13 MED ORDER — CETIRIZINE HCL 10 MG PO TABS: ORAL_TABLET | ORAL | Status: DC

## 2012-03-13 MED ORDER — PREDNISONE 10 MG PO TABS: ORAL_TABLET | ORAL | Status: DC

## 2012-03-13 NOTE — Assessment & Plan Note (Signed)
Moderate persistent asthma with acute sinusitis and flare Plan Augmentin 875mg  twice daily for 7days HOLD zyrtec for 10days Prednisone 10mg  Take 4 for two days three for two days two for two days one for two days Stay on flonase Sinus rinse twice a day for 7days Return 4 months or sooner if unimproving

## 2012-03-13 NOTE — Progress Notes (Signed)
Subjective:    Patient ID: [name], female    DOB: 13-Aug-1933, 78 y.o.   MRN: [id number]  Asthma Her past medical history is significant for asthma.   78 y.o.    female who presents with asthma with reflux disease   10/04/2011 Pt noting more tightness in chest and difficulty in deep breathing. Has a sister in [place] and went to visit for three days .  No A/C and damp and not clean.   No real mucus.    10/16/2011 Just finished pred taper and is unimproved.  Pt notes still tight in chest.  Pt notes dry cough. No heartburn. Pt notes some pndrip.  No mucus.  No regurg or dysphagia. Pt notes a headache. No wheezing.  Pt remains very dyspneic with exertion.  No qhs dyspnea  01/06/2012 At last OV was unimproved d/t poor HFA technique and so got another round of pred. Now using inhaler better and has helped.  Now no real cough. No chest pain.  No wheeze Notes pndrip. Recently Dx Lumbar 3-4-5 disk rupture.  Ramos to perform an injection. Pt denies any significant sore throat, nasal congestion or excess secretions, fever, chills, sweats, unintended weight loss, pleurtic or exertional chest pain, orthopnea PND, or leg swelling Pt denies any increase in rescue therapy over baseline, denies waking up needing it or having any early am or nocturnal exacerbations of coughing/wheezing/or dyspnea. Pt also denies any obvious fluctuation in symptoms with  weather or environmental change or other alleviating or aggravating factors  03/13/2012 Had a 78yo for 9days and then developed sore throat, H/A, and cough.  Now mucus is productive yellow. Notes more dyspnea, no wheezing.  No use of rescue inhaler. Pt denies any significant sore throat, nasal congestion or excess secretions, fever, chills, sweats, unintended weight loss, pleurtic or exertional chest pain, orthopnea PND, or leg swelling Pt denies any increase in rescue therapy over baseline, denies waking up needing it or having any early am or  nocturnal exacerbations of coughing/wheezing/or dyspnea. Pt also denies any obvious fluctuation in symptoms with  weather or environmental change or other alleviating or aggravating factors      PUL ASTHMA HISTORY 03/13/2012 01/06/2012 10/16/2011 10/04/2011 05/03/2011  Symptoms Daily 0-2 days/week Daily Daily 0-2 days/week  Nighttime awakenings 0-2/month 0-2/month 0-2/month 0-2/month 0-2/month  Interference with activity No limitations No limitations Some limitations Some limitations No limitations  SABA use 0-2 days/wk 0-2 days/wk Daily Daily 0-2 days/wk  Exacerbations requiring oral steroids 0-1 / year 0-1 / year 0-1 / year 0-1 / year 0-1 / year   Past Medical History  Diagnosis Date  . ARTHRITIS     s/p bilateral THR  . MIGRAINE HEADACHE   . Interstitial cystitis   . ALLERGIC RHINITIS   . ANEMIA, IRON DEFICIENCY   . ANXIETY   . ASTHMA, EXTRINSIC   . HYPERTENSION   . HYPERLIPIDEMIA   . G E R D   . DIABETES MELLITUS, TYPE II   . FIBROMYALGIA     fibromyalgia  . Chronic constipation      Family History  Problem Relation Age of Onset  . Arthritis Mother   . Hypertension Mother   . Heart disease Father   . Arthritis Father   . Hypertension Father   . Depression Sister   . Hyperlipidemia Sister   . Hypertension Other   . Hyperlipidemia Other      History   Social History  . Marital Status: Married  Spouse Name: N/A    Number of Children: N/A  . Years of Education: N/A   Occupational History  . Not on file.   Social History Main Topics  . Smoking status: Former Smoker -- 0.3 packs/day for 10 years    Types: Cigarettes    Quit date: 03/25/1968  . Smokeless tobacco: Never Used     Comment: Married with two children. Retired Scientist, clinical (histocompatibility and immunogenetics)  . Alcohol Use: No  . Drug Use: No  . Sexually Active: Not on file   Other Topics Concern  . Not on file   Social History Narrative  . No narrative on file     Allergies  Allergen Reactions  . Morphine Sulfate Other (See  Comments)    Makes her hyper     Outpatient Prescriptions Prior to Visit  Medication Sig Dispense Refill  . albuterol (PROAIR HFA) 108 (90 BASE) MCG/ACT inhaler Inhale 2 puffs into the lungs every 6 (six) hours as needed.  3 Inhaler  3  . budesonide-formoterol (SYMBICORT) 160-4.5 MCG/ACT inhaler Inhale 2 puffs into the lungs 2 (two) times daily.  3 Inhaler  3  . Calcium-Vitamin D (SUPER CALCIUM/D) 600-125 MG-UNIT TABS Take by mouth daily.       . Cholecalciferol (VITAMIN D3) 1000 UNITS CAPS Take by mouth daily.        . clonazePAM (KLONOPIN) 0.5 MG tablet Take 1 tablet (0.5 mg total) by mouth daily as needed.  90 tablet  0  . Docusate Sodium (STOOL SOFTENER) 100 MG capsule Take 100 mg by mouth daily.        . fluticasone (FLONASE) 50 MCG/ACT nasal spray Place 2 sprays into the nose daily.  3 g  3  . HYDROcodone-acetaminophen (NORCO/VICODIN) 5-325 MG per tablet as needed.      . meloxicam (MOBIC) 15 MG tablet Take 1 tablet (15 mg total) by mouth daily.  90 tablet  1  . montelukast (SINGULAIR) 10 MG tablet Take 1 tablet (10 mg total) by mouth at bedtime.  90 tablet  3  . Multiple Vitamin (MULTIVITAMIN) tablet Take 1 tablet by mouth daily.        Marland Kitchen omeprazole (PRILOSEC) 20 MG capsule Take 1 capsule (20 mg total) by mouth daily.  90 capsule  3  . pioglitazone (ACTOS) 30 MG tablet Take 0.5 tablets (15 mg total) by mouth daily.  45 tablet  3  . traMADol (ULTRAM) 50 MG tablet Take 1 tablet by mouth as needed.      . valsartan (DIOVAN) 160 MG tablet Take 1 tablet (160 mg total) by mouth daily.  90 tablet  3  . [DISCONTINUED] cetirizine (ZYRTEC) 10 MG tablet Take 10 mg by mouth daily.        . [DISCONTINUED] rosuvastatin (CRESTOR) 20 MG tablet Take 1 tablet (20 mg total) by mouth daily. On HOLD since 08/2011  90 tablet  3  . PARoxetine (PAXIL) 20 MG tablet ON HOLD      . [DISCONTINUED] pregabalin (LYRICA) 25 MG capsule ON HOLD      . [DISCONTINUED] valACYclovir (VALTREX) 1000 MG tablet Take 1 tablet  (1,000 mg total) by mouth 3 (three) times daily.  21 tablet  0  Last reviewed on 03/13/2012 10:44 AM by Storm Frisk, MD   Review of Systems  Constitutional:   No  weight loss, night sweats,  Fevers, chills, fatigue, lassitude. HEENT:   No headaches,  Difficulty swallowing,  Tooth/dental problems,  +++Sore throat,  No sneezing, itching, ear ache,+++ nasal congestion, +++post nasal drip, facial pain is noted  CV:  No chest pain,  Orthopnea, PND, swelling in lower extremities, anasarca, dizziness, palpitations  GI  No heartburn, indigestion, abdominal pain, nausea, vomiting, diarrhea, change in bowel habits, loss of appetite  Resp: No shortness of breath with exertion or at rest.  No excess mucus, no productive cough,  No non-productive cough,  No coughing up of blood.  No change in color of mucus.  No wheezing.  No chest wall deformity  Skin: no rash or lesions.  GU: no dysuria, change in color of urine, no urgency or frequency.  No flank pain.  MS:  No joint pain or swelling.  No decreased range of motion.  No back pain.  Psych:  No change in mood or affect. No depression or anxiety.  No memory loss.     Objective:   Physical Exam  Filed Vitals:   03/13/12 1035  BP: 136/94  Pulse: 75  Temp: 98.1 F (36.7 C)  TempSrc: Oral  Height: 5\' 5"  (1.651 m)  Weight: 163 lb (73.936 kg)  SpO2: 95%    Gen: Pleasant, well-nourished, in no distress,  normal affect  ENT: No lesions,  mouth clear,  oropharynx clear, ++ postnasal drip, nasal purulence  Neck: No JVD, no TMG, no carotid bruits  Lungs: No use of accessory muscles, no dullness to percussion, expired wheeze  Cardiovascular: RRR, heart sounds normal, no murmur or gallops, no peripheral edema  Abdomen: soft and NT, no HSM,  BS normal  Musculoskeletal: No deformities, no cyanosis or clubbing  Neuro: alert, non focal  Skin: Warm, no lesions or rashes        Assessment & Plan:   Moderate  persistent asthma with atopic features Moderate persistent asthma with acute sinusitis and flare Plan Augmentin 875mg  twice daily for 7days HOLD zyrtec for 10days Prednisone 10mg  Take 4 for two days three for two days two for two days one for two days Stay on flonase Sinus rinse twice a day for 7days Return 4 months or sooner if unimproving    Updated Medication List Outpatient Encounter Prescriptions as of 03/13/2012  Medication Sig Dispense Refill  . albuterol (PROAIR HFA) 108 (90 BASE) MCG/ACT inhaler Inhale 2 puffs into the lungs every 6 (six) hours as needed.  3 Inhaler  3  . budesonide-formoterol (SYMBICORT) 160-4.5 MCG/ACT inhaler Inhale 2 puffs into the lungs 2 (two) times daily.  3 Inhaler  3  . Calcium-Vitamin D (SUPER CALCIUM/D) 600-125 MG-UNIT TABS Take by mouth daily.       . cetirizine (ZYRTEC) 10 MG tablet HOLD for 10days then may resume      . Cholecalciferol (VITAMIN D3) 1000 UNITS CAPS Take by mouth daily.        . clonazePAM (KLONOPIN) 0.5 MG tablet Take 1 tablet (0.5 mg total) by mouth daily as needed.  90 tablet  0  . Docusate Sodium (STOOL SOFTENER) 100 MG capsule Take 100 mg by mouth daily.        . fluticasone (FLONASE) 50 MCG/ACT nasal spray Place 2 sprays into the nose daily.  3 g  3  . HYDROcodone-acetaminophen (NORCO/VICODIN) 5-325 MG per tablet as needed.      . meloxicam (MOBIC) 15 MG tablet Take 1 tablet (15 mg total) by mouth daily.  90 tablet  1  . montelukast (SINGULAIR) 10 MG tablet Take 1 tablet (10 mg total) by mouth at bedtime.  90 tablet  3  . Multiple Vitamin (MULTIVITAMIN) tablet Take 1 tablet by mouth daily.        Marland Kitchen omeprazole (PRILOSEC) 20 MG capsule Take 1 capsule (20 mg total) by mouth daily.  90 capsule  3  . pioglitazone (ACTOS) 30 MG tablet Take 0.5 tablets (15 mg total) by mouth daily.  45 tablet  3  . rosuvastatin (CRESTOR) 20 MG tablet Take 20 mg by mouth daily.      . traMADol (ULTRAM) 50 MG tablet Take 1 tablet by mouth as needed.       . valsartan (DIOVAN) 160 MG tablet Take 1 tablet (160 mg total) by mouth daily.  90 tablet  3  . [DISCONTINUED] cetirizine (ZYRTEC) 10 MG tablet Take 10 mg by mouth daily.        . [DISCONTINUED] rosuvastatin (CRESTOR) 20 MG tablet Take 1 tablet (20 mg total) by mouth daily. On HOLD since 08/2011  90 tablet  3  . amoxicillin-clavulanate (AUGMENTIN) 875-125 MG per tablet Take 1 tablet by mouth 2 (two) times daily.  14 tablet  0  . PARoxetine (PAXIL) 20 MG tablet ON HOLD      . predniSONE (DELTASONE) 10 MG tablet Take 4 for two days three for two days two for two days one for two days  20 tablet  0  . [DISCONTINUED] pregabalin (LYRICA) 25 MG capsule ON HOLD      . [DISCONTINUED] valACYclovir (VALTREX) 1000 MG tablet Take 1 tablet (1,000 mg total) by mouth 3 (three) times daily.  21 tablet  0

## 2012-03-13 NOTE — Patient Instructions (Addendum)
Augmentin 875mg  twice daily for 7days HOLD zyrtec for 10days Prednisone 10mg  Take 4 for two days three for two days two for two days one for two days Stay on flonase Sinus rinse twice a day for 7days Return 4 months or sooner if unimproving

## 2012-03-17 ENCOUNTER — Telehealth: Payer: Self-pay | Admitting: Critical Care Medicine

## 2012-03-17 MED ORDER — HYDROCOD POLST-CHLORPHEN POLST 10-8 MG/5ML PO LQCR: 5.0000 mL | Freq: Two times a day (BID) | ORAL | Status: DC | PRN

## 2012-03-17 NOTE — Telephone Encounter (Signed)
rx sent. Pt is aware. Jennifer Castillo, CMA  

## 2012-03-17 NOTE — Telephone Encounter (Signed)
Per SN----call in tussionex #4 oz  1 tsp every 12 hours as needed for cough.  Use plain mucinex 600 mg( blue box)   2 po bid with plenty of fluids.  thanks

## 2012-03-17 NOTE — Telephone Encounter (Signed)
Dr. Delford Field patient, last seen on 03-13-12 and was given Augmentin and prednisone. Pt states she is having increased cough and is having a hard time coughing up mucus. She states she coughs sometimes until she vomits. Pt states she feels a rattling in her chest but is having a hard time getting mucus up. I did recommend that the pt try plain mucinex 1200mg  twice daily with plenty of water to try to thin out mucus. Pt also wants something to help with the cough, especially at night. PW out of office so I will send to doc-of-the-day. Please advise.Carron Curie, CMA   Allergies  Allergen Reactions  . Morphine Sulfate Other (See Comments)    Makes her hyper

## 2012-03-23 ENCOUNTER — Encounter: Payer: Self-pay | Admitting: Internal Medicine

## 2012-03-23 ENCOUNTER — Ambulatory Visit (INDEPENDENT_AMBULATORY_CARE_PROVIDER_SITE_OTHER)
Admission: RE | Admit: 2012-03-23 | Discharge: 2012-03-23 | Disposition: A | Discharge status: Home / Self Care | Payer: Medicare Other | Source: Ambulatory Visit | Attending: Internal Medicine | Admitting: Internal Medicine

## 2012-03-23 ENCOUNTER — Telehealth: Payer: Self-pay | Admitting: Critical Care Medicine

## 2012-03-23 ENCOUNTER — Ambulatory Visit (INDEPENDENT_AMBULATORY_CARE_PROVIDER_SITE_OTHER): Payer: Medicare Other | Admitting: Internal Medicine

## 2012-03-23 VITALS — BP 130/90 | HR 81 | Ht 65.0 in | Wt 162.6 lb

## 2012-03-23 DIAGNOSIS — J45909 Unspecified asthma, uncomplicated: Secondary | ICD-10-CM | POA: Diagnosis not present

## 2012-03-23 DIAGNOSIS — J40 Bronchitis, not specified as acute or chronic: Secondary | ICD-10-CM

## 2012-03-23 DIAGNOSIS — B029 Zoster without complications: Secondary | ICD-10-CM | POA: Diagnosis not present

## 2012-03-23 DIAGNOSIS — B009 Herpesviral infection, unspecified: Secondary | ICD-10-CM | POA: Diagnosis not present

## 2012-03-23 DIAGNOSIS — B001 Herpesviral vesicular dermatitis: Secondary | ICD-10-CM

## 2012-03-23 MED ORDER — HYDROCODONE-HOMATROPINE 5-1.5 MG/5ML PO SYRP: 5.0000 mL | ORAL_SOLUTION | Freq: Four times a day (QID) | ORAL | Status: DC | PRN

## 2012-03-23 MED ORDER — ACYCLOVIR 5 % EX OINT: TOPICAL_OINTMENT | CUTANEOUS | Status: DC

## 2012-03-23 MED ORDER — DOXYCYCLINE HYCLATE 100 MG PO TABS: ORAL_TABLET | ORAL | Status: DC

## 2012-03-23 NOTE — Progress Notes (Signed)
sthma Her past medical history is significant for asthma.   78 y.o.    female who presents with asthma with reflux disease   10/04/2011 Pt noting more tightness in chest and difficulty in deep breathing. Has a sister in [place] and went to visit for three days .  No A/C and damp and not clean.   No real mucus.    10/16/2011 Just finished pred taper and is unimproved.  Pt notes still tight in chest.  Pt notes dry cough. No heartburn. Pt notes some pndrip.  No mucus.  No regurg or dysphagia. Pt notes a headache. No wheezing.  Pt remains very dyspneic with exertion.  No qhs dyspnea  01/06/2012 At last OV was unimproved d/t poor HFA technique and so got another round of pred. Now using inhaler better and has helped.  Now no real cough. No chest pain.  No wheeze Notes pndrip. Recently Dx Lumbar 3-4-5 disk rupture.  Ramos to perform an injection. Pt denies any significant sore throat, nasal congestion or excess secretions, fever, chills, sweats, unintended weight loss, pleurtic or exertional chest pain, orthopnea PND, or leg swelling Pt denies any increase in rescue therapy over baseline, denies waking up needing it or having any early am or nocturnal exacerbations of coughing/wheezing/or dyspnea. Pt also denies any obvious fluctuation in symptoms with  weather or environmental change or other alleviating or aggravating factors  03/13/2012 Had a 78yo for 9days and then developed sore throat, H/A, and cough.  Now mucus is productive yellow. Notes more dyspnea, no wheezing.  No use of rescue inhaler. Pt denies any significant sore throat, nasal congestion or excess secretions, fever, chills, sweats, unintended weight loss, pleurtic or exertional chest pain, orthopnea PND, or leg swelling Pt denies any increase in rescue therapy over baseline, denies waking up needing it or having any early am or nocturnal exacerbations of coughing/wheezing/or dyspnea. Pt also denies any obvious fluctuation in  symptoms with  weather or environmental change or other alleviating or aggravating factors  03/23/12- 78 yoF former smoker followed by Dr Delford Field for asthma, GERD  ACUTE VISIT- [facility]- chest tightness(feels raw), coughing spells-frothy with slight yellow in color. Denies any fevers but has been sweating; "cool" feelings yesterday. Started Augmentin and prednisone with cough syrup at last visit. Still blowing clear mucus from nose and coughing it from upper chest. Feels secretions move in her upper airway. Denies fever or pain. Resolving her fourth round of shingles-left face and also has a new "fever blister" on upper lip.  ROS-see HPI Constitutional:   No-   weight loss, night sweats, fevers, chills, fatigue, lassitude. HEENT:   No-  headaches, difficulty swallowing, tooth/dental problems, sore throat,       No-  sneezing, itching, ear ache, +nasal congestion, post nasal drip,  CV:  No-   chest pain, orthopnea, PND, swelling in lower extremities, anasarca,                                  dizziness, palpitations Resp: + shortness of breath with exertion or at rest.              +  productive cough,  No non-productive cough,  No- coughing up of blood.              No-   change in color of mucus.  No- wheezing.   Skin: No-   rash or lesions. GI:  No-   heartburn, indigestion, abdominal pain, nausea, vomiting,  GU:  MS:  No-   joint pain or swelling.  . Neuro-     nothing unusual Psych:  No- change in mood or affect. No depression or anxiety.  No memory loss.  OBJ- Physical Exam General- Alert, Oriented, Affect-appropriate, Distress- none acute Skin- rash-none, lesions- none, excoriation- none Lymphadenopathy- none Head- atraumatic            Eyes- Gross vision intact, PERRLA, conjunctivae and secretions clear            Ears- Hearing, canals-normal            Nose- Clear, no-Septal dev, mucus, polyps, erosion, perforation             Throat- Mallampati II , mucosa clear , drainage- none,  tonsils- atrophic Neck- flexible , trachea midline, no stridor , thyroid nl, carotid no bruit Chest - symmetrical excursion , unlabored           Heart/CV- RRR , no murmur , no gallop  , no rub, nl s1 s2                           - JVD- none , edema- none, stasis changes- none, varices- none           Lung- clear to P&A, wheeze- none, + raspy cough , dullness-none, rub- none           Chest wall-  Abd-  Br/ Gen/ Rectal- Not done, not indicated Extrem- cyanosis- none, clubbing, none, atrophy- none, strength- nl Neuro- grossly intact to observation

## 2012-03-23 NOTE — Patient Instructions (Addendum)
Scripts to hold for cough syrup and for doxycycline antibiotic to use if needed  Script for valacyclovir for your lip  Order- CXR  Dx bronchitis  Consider trying a probiotic with your antibiotic- such as Florastor, or Activia yogurt

## 2012-03-23 NOTE — Telephone Encounter (Signed)
Patient seen 03/13/12 and was given augmentin, cough syrup and pred. Patient states that she is still coughing up light yellow mucus, some at times thick. Patient has been using mucinex also since appt. Patient states that she still have some chest congestion. Patient would like to be seen today. Patient is available to come and been seen 1:30-2:30 (has to take husband to physical therapy but can come be seen while waiting. Patient agreed to come in today or tomorrow, whatever we have available.  Patient aware that PW out of office but there is some availability with Dr Maple Hudson. Patient is to come in and see Dr Maple Hudson at 11:15 today.    Will route this message to [facility] as FYI that CY "hold" appt was filled @11 :15.

## 2012-03-24 ENCOUNTER — Telehealth: Payer: Self-pay | Admitting: *Deleted

## 2012-03-24 MED ORDER — VALACYCLOVIR HCL 500 MG PO TABS: ORAL_TABLET | ORAL | Status: DC

## 2012-03-24 NOTE — Telephone Encounter (Signed)
Per CY - Valtrex 500mg  take 4 tabs this AM, then 4 tabs this PM #8.  Rx has been sent in, pt is aware.

## 2012-03-24 NOTE — Telephone Encounter (Signed)
Spoke to pt to give the results of her CXR and she states that the ointment that CY gave her yesterday to put on a cold sore on her lip is $200. The pharmacist recommended Valtrex which will only cost her $5.  Please advise.

## 2012-04-03 ENCOUNTER — Emergency Department (HOSPITAL_COMMUNITY): Payer: Medicare Other

## 2012-04-03 ENCOUNTER — Emergency Department (HOSPITAL_COMMUNITY)
Admission: EM | Admit: 2012-04-03 | Discharge: 2012-04-03 | Disposition: A | Discharge status: Home / Self Care | Payer: Medicare Other | Attending: Emergency Medicine | Admitting: Emergency Medicine

## 2012-04-03 ENCOUNTER — Encounter (HOSPITAL_COMMUNITY): Payer: Self-pay | Admitting: *Deleted

## 2012-04-03 DIAGNOSIS — Y9389 Activity, other specified: Secondary | ICD-10-CM | POA: Insufficient documentation

## 2012-04-03 DIAGNOSIS — Z8739 Personal history of other diseases of the musculoskeletal system and connective tissue: Secondary | ICD-10-CM | POA: Insufficient documentation

## 2012-04-03 DIAGNOSIS — Z87891 Personal history of nicotine dependence: Secondary | ICD-10-CM | POA: Diagnosis not present

## 2012-04-03 DIAGNOSIS — K219 Gastro-esophageal reflux disease without esophagitis: Secondary | ICD-10-CM | POA: Diagnosis not present

## 2012-04-03 DIAGNOSIS — T148XXA Other injury of unspecified body region, initial encounter: Secondary | ICD-10-CM | POA: Diagnosis not present

## 2012-04-03 DIAGNOSIS — I10 Essential (primary) hypertension: Secondary | ICD-10-CM | POA: Diagnosis not present

## 2012-04-03 DIAGNOSIS — E119 Type 2 diabetes mellitus without complications: Secondary | ICD-10-CM | POA: Diagnosis not present

## 2012-04-03 DIAGNOSIS — E785 Hyperlipidemia, unspecified: Secondary | ICD-10-CM | POA: Diagnosis not present

## 2012-04-03 DIAGNOSIS — Z862 Personal history of diseases of the blood and blood-forming organs and certain disorders involving the immune mechanism: Secondary | ICD-10-CM | POA: Insufficient documentation

## 2012-04-03 DIAGNOSIS — Z79899 Other long term (current) drug therapy: Secondary | ICD-10-CM | POA: Insufficient documentation

## 2012-04-03 DIAGNOSIS — W010XXA Fall on same level from slipping, tripping and stumbling without subsequent striking against object, initial encounter: Secondary | ICD-10-CM | POA: Insufficient documentation

## 2012-04-03 DIAGNOSIS — S43016A Anterior dislocation of unspecified humerus, initial encounter: Secondary | ICD-10-CM | POA: Diagnosis not present

## 2012-04-03 DIAGNOSIS — J45909 Unspecified asthma, uncomplicated: Secondary | ICD-10-CM | POA: Insufficient documentation

## 2012-04-03 DIAGNOSIS — K5900 Constipation, unspecified: Secondary | ICD-10-CM | POA: Diagnosis not present

## 2012-04-03 DIAGNOSIS — Y92009 Unspecified place in unspecified non-institutional (private) residence as the place of occurrence of the external cause: Secondary | ICD-10-CM | POA: Insufficient documentation

## 2012-04-03 DIAGNOSIS — B001 Herpesviral vesicular dermatitis: Secondary | ICD-10-CM | POA: Insufficient documentation

## 2012-04-03 DIAGNOSIS — M25519 Pain in unspecified shoulder: Secondary | ICD-10-CM | POA: Diagnosis not present

## 2012-04-03 DIAGNOSIS — F411 Generalized anxiety disorder: Secondary | ICD-10-CM | POA: Diagnosis not present

## 2012-04-03 DIAGNOSIS — M79609 Pain in unspecified limb: Secondary | ICD-10-CM | POA: Diagnosis not present

## 2012-04-03 DIAGNOSIS — IMO0002 Reserved for concepts with insufficient information to code with codable children: Secondary | ICD-10-CM | POA: Insufficient documentation

## 2012-04-03 DIAGNOSIS — G43909 Migraine, unspecified, not intractable, without status migrainosus: Secondary | ICD-10-CM | POA: Diagnosis not present

## 2012-04-03 DIAGNOSIS — Z8719 Personal history of other diseases of the digestive system: Secondary | ICD-10-CM | POA: Insufficient documentation

## 2012-04-03 DIAGNOSIS — Z4789 Encounter for other orthopedic aftercare: Secondary | ICD-10-CM | POA: Diagnosis not present

## 2012-04-03 HISTORY — DX: Herpesviral vesicular dermatitis: B00.1

## 2012-04-03 MED ORDER — MIDAZOLAM HCL 2 MG/2ML IJ SOLN
2.0000 mg | Freq: Once | INTRAMUSCULAR | Status: AC
  Administered 2012-04-03: 2 mg via INTRAVENOUS
  Filled 2012-04-03: qty 2

## 2012-04-03 MED ORDER — HYDROMORPHONE HCL PF 1 MG/ML IJ SOLN
1.0000 mg | Freq: Once | INTRAMUSCULAR | Status: AC
  Administered 2012-04-03: 1 mg via INTRAVENOUS
  Filled 2012-04-03: qty 1

## 2012-04-03 MED ORDER — MIDAZOLAM HCL 2 MG/2ML IJ SOLN
INTRAMUSCULAR | Status: AC | PRN
  Administered 2012-04-03: 2 mg via INTRAVENOUS

## 2012-04-03 MED ORDER — ETOMIDATE 2 MG/ML IV SOLN
0.1500 mg/kg | Freq: Once | INTRAVENOUS | Status: AC
  Administered 2012-04-03: 8 mg via INTRAVENOUS
  Filled 2012-04-03: qty 10

## 2012-04-03 MED ORDER — HYDROCODONE-ACETAMINOPHEN 5-325 MG PO TABS: ORAL_TABLET | ORAL | Status: DC

## 2012-04-03 MED ORDER — ETOMIDATE 2 MG/ML IV SOLN
INTRAVENOUS | Status: AC | PRN
  Administered 2012-04-03: 8 mg via INTRAVENOUS

## 2012-04-03 MED ORDER — ETOMIDATE 2 MG/ML IV SOLN
INTRAVENOUS | Status: AC | PRN
  Administered 2012-04-03: 2 mg via INTRAVENOUS

## 2012-04-03 MED ORDER — ONDANSETRON HCL 4 MG/2ML IJ SOLN
4.0000 mg | Freq: Once | INTRAMUSCULAR | Status: AC
  Administered 2012-04-03: 4 mg via INTRAVENOUS
  Filled 2012-04-03: qty 2

## 2012-04-03 NOTE — ED Notes (Signed)
Vital signs stable. 

## 2012-04-03 NOTE — ED Notes (Signed)
Tripped & fell landing on left shoulder. Denies LOC. Given total of fentanyl per EMS without any relief.

## 2012-04-03 NOTE — ED Notes (Signed)
Patient transported to X-ray 

## 2012-04-03 NOTE — Discharge Instructions (Signed)
 Shoulder Dislocation Your shoulder is made up of three bones: the collar bone (clavicle); the shoulder blade (scapula), which includes the socket (glenoid cavity); and the upper arm bone (humerus). Your shoulder joint is the place where these bones meet. Strong, fibrous tissues hold these bones together (ligaments). Muscles and strong, fibrous tissues that connect the muscles to these bones (tendons) allow your arm to move through this joint. The range of motion of your shoulder joint is more extensive than most of your other joints, and the glenoid cavity is very shallow. That is the reason that your shoulder joint is one of the most unstable joints in your body. It is far more prone to dislocation than your other joints. Shoulder dislocation is when your humerus is forced out of your shoulder joint. CAUSES Shoulder dislocation is caused by a forceful impact on your shoulder. This impact usually is from an injury, such as a sports injury or a fall. SYMPTOMS Symptoms of shoulder dislocation include:  Deformity of your shoulder.  Intense pain.  Inability to move your shoulder joint.  Numbness, weakness, or tingling around your shoulder joint (your neck or down your arm).  Bruising or swelling around your shoulder. DIAGNOSIS In order to diagnose a dislocated shoulder, your caregiver will perform a physical exam. Your caregiver also may have an X-ray exam done to see if you have any broken bones. Magnetic resonance imaging (MRI) is a procedure that sometimes is done to help your caregiver see any damage to the soft tissues around your shoulder, particularly your rotator cuff tendons. Additionally, your caregiver also may have electromyography done to measure the electrical discharges produced in your muscles if you have signs or symptoms of nerve damage. TREATMENT A shoulder dislocation is treated by placing the humerus back in the joint (reduction). Your caregiver does this either manually (closed  reduction), by moving your humerus back into the joint through manipulation, or through surgery (open reduction). When your humerus is back in place, severe pain should improve almost immediately. You also may need to have surgery if you have a weak shoulder joint or ligaments, and you have recurring shoulder dislocations, despite rehabilitation. In rare cases, surgery is necessary if your nerves or blood vessels are damaged during the dislocation. After your reduction, your arm will be placed in a shoulder immobilizer or sling to keep it from moving. Your caregiver will have you wear your shoulder immoblizer or sling for 3 days to 3 weeks, depending on how serious your dislocation is. When your shoulder immobilizer or sling is removed, your caregiver may prescribe physical therapy to help improve the range of motion in your shoulder joint. HOME CARE INSTRUCTIONS  The following measures can help to reduce pain and speed up the healing process:  Rest your injured joint. Do not move it. Avoid activities similar to the one that caused your injury.  Apply ice to your injured joint for the first day or two after your reduction or as directed by your caregiver. Applying ice helps to reduce inflammation and pain.  Put ice in a plastic bag.  Place a towel between your skin and the bag.  Leave the ice on for 15 to 20 minutes at a time, every 2 hours while you are awake.  Exercise your hand by squeezing a soft ball. This helps to eliminate stiffness and swelling in your hand and wrist.  Take over-the-counter or prescription medicine for pain or discomfort as told by your caregiver. SEEK IMMEDIATE MEDICAL CARE IF:  Your shoulder immobilizer or sling becomes damaged.  Your pain becomes worse rather than better.  You lose feeling in your arm or hand, or they become white and cold. MAKE SURE YOU:   Understand these instructions.  Will watch your condition.  Will get help right away if you are not  doing well or get worse. Document Released: 12/04/2000 Document Revised: 06/03/2011 Document Reviewed: 12/30/2010 [facility] Patient Information 2013 [place], [place].    Narcotic and benzodiazepine use may cause drowsiness, slowed breathing or dependence.  Please use with caution and do not drive, operate machinery or watch young children alone while taking them.  Taking combinations of these medications or drinking alcohol will potentiate these effects.

## 2012-04-03 NOTE — Assessment & Plan Note (Signed)
Asthma with bronchitis-acute exacerbation. Just finished Augmentin and prednisone. She is not describing purulent discharge. Plan-chest x-ray. Discussed use of probiotics with antibiotics. Cough syrup.

## 2012-04-03 NOTE — Assessment & Plan Note (Signed)
Concerning that she has had 4 separate episodes. Consider immune evaluation when she is stable.

## 2012-04-03 NOTE — Progress Notes (Signed)
Orthopedic Tech Progress Note Patient Details:  [name] 01/22/1934 [id number]  Ortho Devices Type of Ortho Device: Sling immobilizer Ortho Device/Splint Location: lefty arm Ortho Device/Splint Interventions: Application   [name] 04/03/2012, 8:07 PM

## 2012-04-03 NOTE — ED Notes (Signed)
MD at bedside. 

## 2012-04-03 NOTE — ED Notes (Signed)
States landed on left shoulder blade area when fell on concrete. Denies other injuries. No obvious deformity noted. Presently LUE in a sling placed per EMS. Good CSM distal LUE.

## 2012-04-03 NOTE — ED Provider Notes (Signed)
History     CSN: [id number]  Arrival date & time 04/03/12  1742   First MD Initiated Contact with Patient 04/03/12 1757      Chief Complaint  Patient presents with  . Fall  . Shoulder Injury    (Consider location/radiation/quality/duration/timing/severity/associated sxs/prior treatment) HPI Comments: Level V caveat due to patient condition and severe pain. Patient was at home and do to an uneven floor, the patient lost her balance and fell backward right onto her posterior left shoulder. She denies head injury or loss of consciousness. She has severe pain to her left shoulder blade and posterior shoulder area that is much worse with palpation in that area or any movement of her left shoulder. She denies prior shoulder injury. She denies neck pain. She does have back pain but an area of her posterior shoulder. She denies any low back pain, pelvic pain, lower extremity pain. She's not on Coumadin. She has a prior history of multiple orthopedic surgeries including hip prostheses. She denies syncopal event, headache. EMS provided 150 mcg of  IV fentanyl in total with no significant improvement in her pain. She denies tingling or numbness of her hand. She reports movement of her left hand causes pain in her shoulder. She denies any pain in her elbow or forearm.  Patient is a 79 y.o. female presenting with fall and shoulder injury. The history is provided by the patient and a relative.  Fall  Shoulder Injury    Past Medical History  Diagnosis Date  . ARTHRITIS     s/p bilateral THR  . MIGRAINE HEADACHE   . Interstitial cystitis   . ALLERGIC RHINITIS   . ANEMIA, IRON DEFICIENCY   . ANXIETY   . ASTHMA, EXTRINSIC   . HYPERTENSION   . HYPERLIPIDEMIA   . G E R D   . DIABETES MELLITUS, TYPE II   . FIBROMYALGIA     fibromyalgia  . Chronic constipation     Past Surgical History  Procedure Date  . Cataract extraction   . Total hip arthroplasty   . Abdominal hysterectomy   . Ovarian  cyst removed   . Lumbar disc surgery     Family History  Problem Relation Age of Onset  . Arthritis Mother   . Hypertension Mother   . Heart disease Father   . Arthritis Father   . Hypertension Father   . Depression Sister   . Hyperlipidemia Sister   . Hypertension Other   . Hyperlipidemia Other     History  Substance Use Topics  . Smoking status: Former Smoker -- 0.3 packs/day for 10 years    Types: Cigarettes    Quit date: 03/25/1968  . Smokeless tobacco: Never Used     Comment: Married with two children. Retired Scientist, clinical (histocompatibility and immunogenetics)  . Alcohol Use: No    OB History    Grav Para Term Preterm Abortions TAB SAB Ect Mult Living                  Review of Systems  Unable to perform ROS: Other    Allergies  Morphine sulfate  Home Medications   Current Outpatient Rx  Name  Route  Sig  Dispense  Refill  . ACETAMINOPHEN 500 MG PO TABS   Oral   Take 1,000 mg by mouth every 6 (six) hours as needed. For pain         . ALBUTEROL SULFATE HFA 108 (90 BASE) MCG/ACT IN AERS   Inhalation  Inhale 2 puffs into the lungs every 6 (six) hours as needed.   3 Inhaler   3   . BUDESONIDE-FORMOTEROL FUMARATE 160-4.5 MCG/ACT IN AERO   Inhalation   Inhale 2 puffs into the lungs 2 (two) times daily.   3 Inhaler   3   . CALCIUM-VITAMIN D 600-125 MG-UNIT PO TABS   Oral   Take 1 tablet by mouth daily.          Marland Kitchen CETIRIZINE HCL 10 MG PO TABS      HOLD for 10days then may resume         . VITAMIN D3 1000 UNITS PO CAPS   Oral   Take 1 capsule by mouth daily.          Marland Kitchen CLONAZEPAM 0.5 MG PO TABS   Oral   Take 1 tablet (0.5 mg total) by mouth daily as needed.   90 tablet   0   . DOCUSATE SODIUM 100 MG PO TABS   Oral   Take 100 mg by mouth daily.           . DULOXETINE HCL 60 MG PO CPEP   Oral   Take 60 mg by mouth daily.         Marland Kitchen FLUTICASONE PROPIONATE 50 MCG/ACT NA SUSP   Nasal   Place 2 sprays into the nose daily.   3 g   3   . GUAIFENESIN ER 600 MG PO  TB12   Oral   Take 1,200 mg by mouth 2 (two) times daily.         Marland Kitchen HYDROCODONE-ACETAMINOPHEN 5-325 MG PO TABS   Oral   Take 1 tablet by mouth every 4 (four) hours as needed. For pain         . HYDROCODONE-HOMATROPINE 5-1.5 MG/5ML PO SYRP   Oral   Take 5 mLs by mouth every 6 (six) hours as needed for cough.   180 mL   0   . MELOXICAM 15 MG PO TABS   Oral   Take 1 tablet (15 mg total) by mouth daily.   90 tablet   1   . MONTELUKAST SODIUM 10 MG PO TABS   Oral   Take 1 tablet (10 mg total) by mouth at bedtime.   90 tablet   3   . ONE-DAILY MULTI VITAMINS PO TABS   Oral   Take 1 tablet by mouth daily.           Marland Kitchen OMEPRAZOLE 20 MG PO CPDR   Oral   Take 1 capsule (20 mg total) by mouth daily.   90 capsule   3   . PIOGLITAZONE HCL 30 MG PO TABS   Oral   Take 0.5 tablets (15 mg total) by mouth daily.   45 tablet   3   . ROSUVASTATIN CALCIUM 20 MG PO TABS   Oral   Take 20 mg by mouth daily.         . TRAMADOL HCL 50 MG PO TABS   Oral   Take 1 tablet by mouth every 6 (six) hours as needed. For pain         . VALSARTAN 160 MG PO TABS   Oral   Take 1 tablet (160 mg total) by mouth daily.   90 tablet   3     BP 166/73  Pulse 92  Temp 98 F (36.7 C)  Resp 24  SpO2 97%  Physical Exam  Nursing note and  vitals reviewed. Constitutional: She is oriented to person, place, and time. She appears well-developed and well-nourished. She appears distressed.  HENT:  Head: Normocephalic and atraumatic.  Eyes: No scleral icterus.  Neck: Normal range of motion. Neck supple. No spinous process tenderness and no muscular tenderness present.  Cardiovascular: Normal rate and regular rhythm.   No murmur heard. Pulmonary/Chest: Effort normal. No respiratory distress. She has no wheezes.  Abdominal: Soft. There is no tenderness. There is no rebound and no guarding.  Musculoskeletal:       Left shoulder: She exhibits decreased range of motion and deformity. She  exhibits no bony tenderness, normal pulse and normal strength.       Arms:      No clavicular deformity, crepitus or tenderness.  Neurological: She is alert and oriented to person, place, and time. She has normal strength. No sensory deficit. She exhibits normal muscle tone. GCS eye subscore is 4. GCS verbal subscore is 5. GCS motor subscore is 6.  Skin: Skin is warm and dry. No rash noted. She is not diaphoretic.  Psychiatric: She has a normal mood and affect.    ED Course  Reduction of dislocation Date/Time: 04/03/2012 8:04 PM Performed by: Lear Ng. Authorized by: Lear Ng Consent: Verbal consent obtained. Written consent obtained. Risks and benefits: risks, benefits and alternatives were discussed Consent given by: patient Patient understanding: patient states understanding of the procedure being performed Patient consent: the patient's understanding of the procedure matches consent given Procedure consent: procedure consent matches procedure scheduled Patient identity confirmed: arm band Time out: Immediately prior to procedure a "time out" was called to verify the correct patient, procedure, equipment, support staff and site/side marked as required. Patient sedated: yes Sedation type: moderate (conscious) sedation Sedatives: etomidate, diazepam and fentanyl Sedation start date/time: 04/03/2012 7:50 PM Sedation end date/time: 04/03/2012 8:05 PM Patient tolerance: Patient tolerated the procedure well with no immediate complications.   (including critical care time)  Labs Reviewed - No data to display Dg Shoulder Left  04/03/2012  *RADIOLOGY REPORT*  Clinical Data: Left shoulder pain  LEFT SHOULDER - 2+ VIEW  Comparison: None.  Findings: There is anterior shoulder dislocation with the humeral head anterior and  medial to the glenoid fossa.  No evidence of fracture of the humeral head or scapula.  IMPRESSION: Anterior shoulder dislocation.   Original Report Authenticated  By: Genevive Bi, M.D.      1. Anterior shoulder dislocation     Room air saturation is 97% interpret this to be normal.  8:21 PM I reviewed the second portable shoulder x-ray and reduction is successful. No fractures are noted.  MDM   7:09 PM I reviewed the patient's review left shoulder x-ray myself and CVA anterior-inferior dislocation without obvious fracture. Will perform procedural sedation for purpose of reduction of dislocation.          Gavin Pound. Oletta Lamas, MD 04/03/12 2021

## 2012-04-03 NOTE — Assessment & Plan Note (Addendum)
Fever blister sports impression that her recent airway symptoms were initially a viral illness Plan- antiviral therapy

## 2012-04-03 NOTE — Sedation Documentation (Signed)
Medication dose calculated and verified for 

## 2012-04-03 NOTE — ED Notes (Signed)
Family updated as to patient's status.

## 2012-04-06 DIAGNOSIS — M24819 Other specific joint derangements of unspecified shoulder, not elsewhere classified: Secondary | ICD-10-CM | POA: Diagnosis not present

## 2012-04-17 DIAGNOSIS — M5137 Other intervertebral disc degeneration, lumbosacral region: Secondary | ICD-10-CM | POA: Diagnosis not present

## 2012-04-17 DIAGNOSIS — M961 Postlaminectomy syndrome, not elsewhere classified: Secondary | ICD-10-CM | POA: Diagnosis not present

## 2012-05-19 ENCOUNTER — Telehealth: Payer: Self-pay | Admitting: Critical Care Medicine

## 2012-05-19 MED ORDER — MONTELUKAST SODIUM 10 MG PO TABS: 10.0000 mg | ORAL_TABLET | Freq: Every day | ORAL | Status: DC

## 2012-05-19 NOTE — Telephone Encounter (Signed)
lmtcb x1 for pt. rx has been sent 

## 2012-05-20 NOTE — Telephone Encounter (Signed)
[email or web address]  

## 2012-05-21 ENCOUNTER — Ambulatory Visit (INDEPENDENT_AMBULATORY_CARE_PROVIDER_SITE_OTHER): Payer: Medicare Other | Admitting: Internal Medicine

## 2012-05-21 ENCOUNTER — Encounter: Payer: Self-pay | Admitting: Internal Medicine

## 2012-05-21 ENCOUNTER — Other Ambulatory Visit (INDEPENDENT_AMBULATORY_CARE_PROVIDER_SITE_OTHER): Payer: Medicare Other

## 2012-05-21 VITALS — BP 132/70 | HR 81 | Temp 97.7°F | Wt 167.8 lb

## 2012-05-21 DIAGNOSIS — IMO0002 Reserved for concepts with insufficient information to code with codable children: Secondary | ICD-10-CM

## 2012-05-21 DIAGNOSIS — E119 Type 2 diabetes mellitus without complications: Secondary | ICD-10-CM

## 2012-05-21 DIAGNOSIS — M5416 Radiculopathy, lumbar region: Secondary | ICD-10-CM | POA: Insufficient documentation

## 2012-05-21 DIAGNOSIS — F411 Generalized anxiety disorder: Secondary | ICD-10-CM | POA: Diagnosis not present

## 2012-05-21 DIAGNOSIS — E785 Hyperlipidemia, unspecified: Secondary | ICD-10-CM

## 2012-05-21 DIAGNOSIS — S43005S Unspecified dislocation of left shoulder joint, sequela: Secondary | ICD-10-CM

## 2012-05-21 DIAGNOSIS — S43429A Sprain of unspecified rotator cuff capsule, initial encounter: Secondary | ICD-10-CM

## 2012-05-21 DIAGNOSIS — M75102 Unspecified rotator cuff tear or rupture of left shoulder, not specified as traumatic: Secondary | ICD-10-CM

## 2012-05-21 LAB — HEMOGLOBIN A1C: Hgb A1c MFr Bld: 6.1 % (ref 4.6–6.5)

## 2012-05-21 LAB — LIPID PANEL
Cholesterol: 184 mg/dL (ref 0–200)
LDL Cholesterol: 100 mg/dL — ABNORMAL HIGH (ref 0–99)
Triglycerides: 117 mg/dL (ref 0.0–149.0)

## 2012-05-21 MED ORDER — MELOXICAM 15 MG PO TABS: 15.0000 mg | ORAL_TABLET | Freq: Every day | ORAL | Status: DC

## 2012-05-21 MED ORDER — CLONAZEPAM 0.5 MG PO TABS
0.5000 mg | ORAL_TABLET | Freq: Every day | ORAL | Status: DC | PRN
Start: 2012-05-21 — End: 2012-11-19

## 2012-05-21 NOTE — Telephone Encounter (Signed)
Pt aware that rx has been sent.

## 2012-05-21 NOTE — Assessment & Plan Note (Signed)
Intolerant of metformin due to diarrhea Continue actos - check a1c and titrate if needed On ARB and statin, regular eye exams The current medical regimen is effective;  continue present plan and medications.  Lab Results  Component Value Date   HGBA1C 5.9 11/19/2011

## 2012-05-21 NOTE — Assessment & Plan Note (Signed)
inc symptoms 06/2010 precipitated by illness of spouse - increased dose paxil cr to bid spring 2012 and doing well Changed SSRI to short acting form, slightly lower dose 08/2010 - continue same Has never been prescribed Cymbalta, question entered and air or that emergency room visit January 10. Medication removed from med list today

## 2012-05-21 NOTE — Progress Notes (Signed)
  Subjective:    Patient ID: [name], female    DOB: 1933/08/14, 79 y.o.   MRN: [id number]  HPI  Here for follow up  -reviewed interval history Pending surgery for left rotator cuff tear next Friday 05/01/12  Past Medical History  Diagnosis Date  . ARTHRITIS     s/p bilateral THR  . MIGRAINE HEADACHE   . Interstitial cystitis   . ALLERGIC RHINITIS   . ANEMIA, IRON DEFICIENCY   . ANXIETY   . ASTHMA, EXTRINSIC   . HYPERTENSION   . HYPERLIPIDEMIA   . G E R D   . DIABETES MELLITUS, TYPE II   . FIBROMYALGIA     fibromyalgia  . Chronic constipation    Review of Systems  Constitutional: Negative for fever and fatigue.  Respiratory: Negative for cough and shortness of breath.   Cardiovascular: Negative for chest pain and palpitations.  Musculoskeletal: Negative for back pain and gait problem.       Objective:   Physical Exam  BP 132/70  Pulse 81  Temp(Src) 97.7 F (36.5 C) (Oral)  Wt 167 lb 12.8 oz (76.114 kg)  BMI 27.92 kg/m2  SpO2 97% Wt Readings from Last 3 Encounters:  05/21/12 167 lb 12.8 oz (76.114 kg)  03/23/12 162 lb 9.6 oz (73.755 kg)  03/13/12 163 lb (73.936 kg)   Constitutional: She appears well-developed and well-nourished. No distress. spouse at side Neck: Normal range of motion. Neck supple. No JVD present. No thyromegaly present.  Cardiovascular: Normal rate, regular rhythm and normal heart sounds.  No murmur heard. No BLE edema. Pulmonary/Chest: Effort normal and breath sounds normal. No respiratory distress. She has no wheezes.  MSkel: L shoulder not examined today Neurological: She is alert and oriented to person, place, and time. No cranial nerve deficit. Coordination normal.    Lab Results  Component Value Date   WBC 6.1 05/20/2011   HGB 12.6 05/20/2011   HCT 37.5 05/20/2011   PLT 156 05/20/2011   GLUCOSE 110* 05/20/2011   CHOL 298* 04/12/2011   TRIG 311.0* 04/12/2011   HDL 58.80 04/12/2011   LDLDIRECT 195.1 04/12/2011   ALT 20 05/20/2011   AST 23 05/20/2011   NA 142 05/20/2011   K 4.0 05/20/2011   CL 105 05/20/2011   CREATININE 0.90 05/20/2011   BUN 14 05/20/2011   CO2 27 05/20/2011   TSH 1.67 04/12/2011   INR 0.9 04/12/2011   HGBA1C 5.9 11/19/2011      Assessment & Plan:  Left shoulder rotator cuff tear - sequela of anterior dislocation from accidental fall 04/03/2012 reviewed. Working with orthopedics for same (supple) and planning surgical repair February 7. Interval history reviewed, continue current pain medications, refill him meloxicam as for other arthritic pain -encouragement and support provided at length today  RLE lumbar radiculopathy symptoms, improved in interval following lumbar injection x2 by Dr [name] 02/2012 -hx reviewed -has been working with [name] (ortho), [name] and [name] [name]) on same - some improvement s/p "injection" at [employer] office x 1, then 2 at [name] - ?benefit of additional opinion from ortho spine ([name]) if flares again - advised pt on same and encouraged to maximize less invasive therapy before surgery so long as finding some benefit from injections

## 2012-05-21 NOTE — Patient Instructions (Signed)
It was good to see you today. We have reviewed your interval history and records including labs and tests today Test(s) ordered today. Your results will be released to MyChart (or called to you) after review, usually within 72hours after test completion. If any changes need to be made, you will be notified at that same time. Medications reviewed and updated, no changes at this time.. Your prescription(s) have been submitted to your local and mail order pharmacy. Please take as directed and contact our office if you believe you are having problem(s) with the medication(s). Please schedule followup in 6 months, call sooner if problems.

## 2012-05-21 NOTE — Assessment & Plan Note (Signed)
Resumed simva 05/2010 - caused leg pains Changed to crestor 20 every other day 08/2010 -  minimally compliant due to side effects, but resumed regular effort at compliance with same fall 2013 Reviewed last lipids - No new rx change recommended today

## 2012-06-02 DIAGNOSIS — M19019 Primary osteoarthritis, unspecified shoulder: Secondary | ICD-10-CM | POA: Diagnosis not present

## 2012-06-02 DIAGNOSIS — S43006A Unspecified dislocation of unspecified shoulder joint, initial encounter: Secondary | ICD-10-CM | POA: Diagnosis not present

## 2012-06-02 DIAGNOSIS — Y929 Unspecified place or not applicable: Secondary | ICD-10-CM | POA: Diagnosis not present

## 2012-06-02 DIAGNOSIS — M9420 Chondromalacia, unspecified site: Secondary | ICD-10-CM | POA: Diagnosis not present

## 2012-06-02 DIAGNOSIS — M24119 Other articular cartilage disorders, unspecified shoulder: Secondary | ICD-10-CM | POA: Diagnosis not present

## 2012-06-02 DIAGNOSIS — M67919 Unspecified disorder of synovium and tendon, unspecified shoulder: Secondary | ICD-10-CM | POA: Diagnosis not present

## 2012-06-02 DIAGNOSIS — R296 Repeated falls: Secondary | ICD-10-CM | POA: Diagnosis not present

## 2012-06-02 DIAGNOSIS — M75100 Unspecified rotator cuff tear or rupture of unspecified shoulder, not specified as traumatic: Secondary | ICD-10-CM | POA: Diagnosis not present

## 2012-06-10 DIAGNOSIS — M25519 Pain in unspecified shoulder: Secondary | ICD-10-CM | POA: Diagnosis not present

## 2012-06-10 DIAGNOSIS — Z9889 Other specified postprocedural states: Secondary | ICD-10-CM | POA: Diagnosis not present

## 2012-06-16 DIAGNOSIS — M25519 Pain in unspecified shoulder: Secondary | ICD-10-CM | POA: Diagnosis not present

## 2012-06-18 DIAGNOSIS — M25519 Pain in unspecified shoulder: Secondary | ICD-10-CM | POA: Diagnosis not present

## 2012-06-23 DIAGNOSIS — M25519 Pain in unspecified shoulder: Secondary | ICD-10-CM | POA: Diagnosis not present

## 2012-06-25 DIAGNOSIS — M25519 Pain in unspecified shoulder: Secondary | ICD-10-CM | POA: Diagnosis not present

## 2012-06-30 DIAGNOSIS — M25519 Pain in unspecified shoulder: Secondary | ICD-10-CM | POA: Diagnosis not present

## 2012-07-02 DIAGNOSIS — M25519 Pain in unspecified shoulder: Secondary | ICD-10-CM | POA: Diagnosis not present

## 2012-07-06 DIAGNOSIS — M25519 Pain in unspecified shoulder: Secondary | ICD-10-CM | POA: Diagnosis not present

## 2012-07-08 DIAGNOSIS — M25519 Pain in unspecified shoulder: Secondary | ICD-10-CM | POA: Diagnosis not present

## 2012-07-14 DIAGNOSIS — M25519 Pain in unspecified shoulder: Secondary | ICD-10-CM | POA: Diagnosis not present

## 2012-07-16 DIAGNOSIS — M25519 Pain in unspecified shoulder: Secondary | ICD-10-CM | POA: Diagnosis not present

## 2012-07-21 DIAGNOSIS — M25519 Pain in unspecified shoulder: Secondary | ICD-10-CM | POA: Diagnosis not present

## 2012-07-23 DIAGNOSIS — M25519 Pain in unspecified shoulder: Secondary | ICD-10-CM | POA: Diagnosis not present

## 2012-07-28 DIAGNOSIS — M25519 Pain in unspecified shoulder: Secondary | ICD-10-CM | POA: Diagnosis not present

## 2012-07-31 DIAGNOSIS — M5137 Other intervertebral disc degeneration, lumbosacral region: Secondary | ICD-10-CM | POA: Diagnosis not present

## 2012-07-31 DIAGNOSIS — M961 Postlaminectomy syndrome, not elsewhere classified: Secondary | ICD-10-CM | POA: Diagnosis not present

## 2012-08-03 DIAGNOSIS — Z9889 Other specified postprocedural states: Secondary | ICD-10-CM | POA: Diagnosis not present

## 2012-08-04 DIAGNOSIS — M961 Postlaminectomy syndrome, not elsewhere classified: Secondary | ICD-10-CM | POA: Diagnosis not present

## 2012-08-06 DIAGNOSIS — M25519 Pain in unspecified shoulder: Secondary | ICD-10-CM | POA: Diagnosis not present

## 2012-08-11 ENCOUNTER — Encounter: Payer: Self-pay | Admitting: Critical Care Medicine

## 2012-08-11 ENCOUNTER — Ambulatory Visit (INDEPENDENT_AMBULATORY_CARE_PROVIDER_SITE_OTHER): Payer: Medicare Other | Admitting: Critical Care Medicine

## 2012-08-11 VITALS — BP 130/82 | HR 84 | Temp 97.4°F | Ht 65.0 in | Wt 166.0 lb

## 2012-08-11 DIAGNOSIS — J45909 Unspecified asthma, uncomplicated: Secondary | ICD-10-CM | POA: Diagnosis not present

## 2012-08-11 DIAGNOSIS — M25519 Pain in unspecified shoulder: Secondary | ICD-10-CM | POA: Diagnosis not present

## 2012-08-11 MED ORDER — METHYLPREDNISOLONE ACETATE 80 MG/ML IJ SUSP
120.0000 mg | Freq: Once | INTRAMUSCULAR | Status: AC
  Administered 2012-08-11: 120 mg via INTRAMUSCULAR

## 2012-08-11 NOTE — Progress Notes (Signed)
Subjective:    Patient ID: [name], female    DOB: Jan 28, 1934, 79 y.o.   MRN: [id number]  HPI sthma Her past medical history is significant for asthma.   79 y.o.    female who presents with asthma with reflux disease   08/11/2012 Notes more chest tightness. No mucus.  More dyspnea. Using rescue inhaler more. PUL ASTHMA HISTORY 08/11/2012 03/13/2012 01/06/2012 10/16/2011 10/04/2011  Symptoms Daily Daily 0-2 days/week Daily Daily  Nighttime awakenings 3-4/month 0-2/month 0-2/month 0-2/month 0-2/month  Interference with activity No limitations No limitations No limitations Some limitations Some limitations  SABA use Daily 0-2 days/wk 0-2 days/wk Daily Daily  Exacerbations requiring oral steroids 2 or more / year 0-1 / year 0-1 / year 0-1 / year 0-1 / year        Review of Systems Constitutional:   No  weight loss, night sweats,  Fevers, chills, fatigue, lassitude. HEENT:   No headaches,  Difficulty swallowing,  Tooth/dental problems,  Sore throat,                No sneezing, itching, ear ache, nasal congestion, post nasal drip,   CV:  No chest pain,  Orthopnea, PND, swelling in lower extremities, anasarca, dizziness, palpitations  GI  No heartburn, indigestion, abdominal pain, nausea, vomiting, diarrhea, change in bowel habits, loss of appetite  Resp: ++ shortness of breath with exertion not  at rest.  No excess mucus, no productive cough,  No non-productive cough,  No coughing up of blood.  No change in color of mucus.  No wheezing.  No chest wall deformity  Skin: no rash or lesions.  GU: no dysuria, change in color of urine, no urgency or frequency.  No flank pain.  MS:  No joint pain or swelling.  No decreased range of motion.  No back pain.  Psych:  No change in mood or affect. No depression or anxiety.  No memory loss.     Objective:   Physical Exam Filed Vitals:   08/11/12 0950  BP: 130/82  Pulse: 84  Temp: 97.4 F (36.3 C)  TempSrc: Oral  Height: 5\' 5"   (1.651 m)  Weight: 166 lb (75.297 kg)  SpO2: 96%    Gen: Pleasant, well-nourished, in no distress,  normal affect  ENT: No lesions,  mouth clear,  oropharynx clear, no postnasal drip  Neck: No JVD, no TMG, no carotid bruits  Lungs: No use of accessory muscles, no dullness to percussion,Expired wheezes  Cardiovascular: RRR, heart sounds normal, no murmur or gallops, no peripheral edema  Abdomen: soft and NT, no HSM,  BS normal  Musculoskeletal: No deformities, no cyanosis or clubbing  Neuro: alert, non focal  Skin: Warm, no lesions or rashes  No results found.        Assessment & Plan:   Moderate persistent asthma with atopic features Moderate persistent asthma with significant atopic  features now with acute flare due to significant allergic trigger Plan A depomedrol 120mg  injection was given Stay on symbicort two puff twice daily Use proair as needed Return 4 months     Updated Medication List Outpatient Encounter Prescriptions as of 08/11/2012  Medication Sig Dispense Refill  . acetaminophen (TYLENOL) 500 MG tablet Take 1,000 mg by mouth every 6 (six) hours as needed. For pain      . albuterol (PROAIR HFA) 108 (90 BASE) MCG/ACT inhaler Inhale 2 puffs into the lungs every 6 (six) hours as needed.  3 Inhaler  3  . Calcium-Vitamin  D (SUPER CALCIUM/D) 600-125 MG-UNIT TABS Take 1 tablet by mouth daily.       . cetirizine (ZYRTEC) 10 MG tablet       . Cholecalciferol (VITAMIN D3) 1000 UNITS CAPS Take 1 capsule by mouth daily.       . clonazePAM (KLONOPIN) 0.5 MG tablet Take 1 tablet (0.5 mg total) by mouth daily as needed for anxiety.  90 tablet  1  . Docusate Sodium (STOOL SOFTENER) 100 MG capsule Take 100 mg by mouth daily.        . fluticasone (FLONASE) 50 MCG/ACT nasal spray Place 2 sprays into the nose daily.  3 g  3  . guaiFENesin (MUCINEX) 600 MG 12 hr tablet Take 1,200 mg by mouth 2 (two) times daily.      Marland Kitchen HYDROcodone-acetaminophen (NORCO/VICODIN) 5-325 MG  per tablet 1-2 tablets po q 6 hours prn moderate to severe pain  20 tablet  0  . meloxicam (MOBIC) 15 MG tablet Take 1 tablet (15 mg total) by mouth daily.  90 tablet  1  . montelukast (SINGULAIR) 10 MG tablet Take 1 tablet (10 mg total) by mouth at bedtime.  90 tablet  3  . Multiple Vitamin (MULTIVITAMIN) tablet Take 1 tablet by mouth daily.        Marland Kitchen omeprazole (PRILOSEC) 20 MG capsule Take 1 capsule (20 mg total) by mouth daily.  90 capsule  3  . PARoxetine (PAXIL) 20 MG tablet Take 1 by mouth daily      . pioglitazone (ACTOS) 30 MG tablet Take 0.5 tablets (15 mg total) by mouth daily.  45 tablet  3  . rosuvastatin (CRESTOR) 20 MG tablet Take 20 mg by mouth daily.      . valsartan (DIOVAN) 160 MG tablet Take 1 tablet (160 mg total) by mouth daily.  90 tablet  3  . zolpidem (AMBIEN) 10 MG tablet Take 10 mg by mouth at bedtime as needed.       . [DISCONTINUED] cetirizine (ZYRTEC) 10 MG tablet HOLD for 10days then may resume      . budesonide-formoterol (SYMBICORT) 160-4.5 MCG/ACT inhaler Inhale 2 puffs into the lungs 2 (two) times daily.  3 Inhaler  3  . [EXPIRED] methylPREDNISolone acetate (DEPO-MEDROL) injection 120 mg        No facility-administered encounter medications on file as of 08/11/2012.

## 2012-08-11 NOTE — Patient Instructions (Addendum)
A depomedrol 120mg  injection was given Stay on symbicort two puff twice daily Use proair as needed Return 4 months

## 2012-08-11 NOTE — Assessment & Plan Note (Signed)
Moderate persistent asthma with significant atopic  features now with acute flare due to significant allergic trigger Plan A depomedrol 120mg  injection was given Stay on symbicort two puff twice daily Use proair as needed Return 4 months

## 2012-08-21 DIAGNOSIS — M25519 Pain in unspecified shoulder: Secondary | ICD-10-CM | POA: Diagnosis not present

## 2012-08-21 DIAGNOSIS — M75120 Complete rotator cuff tear or rupture of unspecified shoulder, not specified as traumatic: Secondary | ICD-10-CM | POA: Diagnosis not present

## 2012-08-26 DIAGNOSIS — M25519 Pain in unspecified shoulder: Secondary | ICD-10-CM | POA: Diagnosis not present

## 2012-08-26 DIAGNOSIS — M75120 Complete rotator cuff tear or rupture of unspecified shoulder, not specified as traumatic: Secondary | ICD-10-CM | POA: Diagnosis not present

## 2012-08-31 DIAGNOSIS — M25519 Pain in unspecified shoulder: Secondary | ICD-10-CM | POA: Diagnosis not present

## 2012-08-31 DIAGNOSIS — M75120 Complete rotator cuff tear or rupture of unspecified shoulder, not specified as traumatic: Secondary | ICD-10-CM | POA: Diagnosis not present

## 2012-09-05 DIAGNOSIS — M25519 Pain in unspecified shoulder: Secondary | ICD-10-CM | POA: Diagnosis not present

## 2012-09-05 DIAGNOSIS — M75120 Complete rotator cuff tear or rupture of unspecified shoulder, not specified as traumatic: Secondary | ICD-10-CM | POA: Diagnosis not present

## 2012-09-11 DIAGNOSIS — H40019 Open angle with borderline findings, low risk, unspecified eye: Secondary | ICD-10-CM | POA: Diagnosis not present

## 2012-09-11 DIAGNOSIS — Z961 Presence of intraocular lens: Secondary | ICD-10-CM | POA: Diagnosis not present

## 2012-09-11 DIAGNOSIS — H26499 Other secondary cataract, unspecified eye: Secondary | ICD-10-CM | POA: Diagnosis not present

## 2012-09-11 DIAGNOSIS — H04129 Dry eye syndrome of unspecified lacrimal gland: Secondary | ICD-10-CM | POA: Diagnosis not present

## 2012-09-15 DIAGNOSIS — M25519 Pain in unspecified shoulder: Secondary | ICD-10-CM | POA: Diagnosis not present

## 2012-09-15 DIAGNOSIS — M75120 Complete rotator cuff tear or rupture of unspecified shoulder, not specified as traumatic: Secondary | ICD-10-CM | POA: Diagnosis not present

## 2012-09-17 DIAGNOSIS — M25519 Pain in unspecified shoulder: Secondary | ICD-10-CM | POA: Diagnosis not present

## 2012-09-17 DIAGNOSIS — M75120 Complete rotator cuff tear or rupture of unspecified shoulder, not specified as traumatic: Secondary | ICD-10-CM | POA: Diagnosis not present

## 2012-09-22 DIAGNOSIS — M25519 Pain in unspecified shoulder: Secondary | ICD-10-CM | POA: Diagnosis not present

## 2012-09-22 DIAGNOSIS — M75120 Complete rotator cuff tear or rupture of unspecified shoulder, not specified as traumatic: Secondary | ICD-10-CM | POA: Diagnosis not present

## 2012-09-24 DIAGNOSIS — M75120 Complete rotator cuff tear or rupture of unspecified shoulder, not specified as traumatic: Secondary | ICD-10-CM | POA: Diagnosis not present

## 2012-09-24 DIAGNOSIS — M25519 Pain in unspecified shoulder: Secondary | ICD-10-CM | POA: Diagnosis not present

## 2012-09-29 DIAGNOSIS — M25519 Pain in unspecified shoulder: Secondary | ICD-10-CM | POA: Diagnosis not present

## 2012-09-29 DIAGNOSIS — M75120 Complete rotator cuff tear or rupture of unspecified shoulder, not specified as traumatic: Secondary | ICD-10-CM | POA: Diagnosis not present

## 2012-09-30 ENCOUNTER — Telehealth: Payer: Self-pay | Admitting: Critical Care Medicine

## 2012-09-30 MED ORDER — BUDESONIDE-FORMOTEROL FUMARATE 160-4.5 MCG/ACT IN AERO: 2.0000 | INHALATION_SPRAY | Freq: Two times a day (BID) | RESPIRATORY_TRACT | Status: DC

## 2012-09-30 NOTE — Telephone Encounter (Signed)
LMOM that rx for Symbicort was sent to pharmacy.

## 2012-10-01 DIAGNOSIS — M25519 Pain in unspecified shoulder: Secondary | ICD-10-CM | POA: Diagnosis not present

## 2012-10-01 DIAGNOSIS — M75120 Complete rotator cuff tear or rupture of unspecified shoulder, not specified as traumatic: Secondary | ICD-10-CM | POA: Diagnosis not present

## 2012-10-02 DIAGNOSIS — Z9889 Other specified postprocedural states: Secondary | ICD-10-CM | POA: Diagnosis not present

## 2012-10-06 DIAGNOSIS — M25519 Pain in unspecified shoulder: Secondary | ICD-10-CM | POA: Diagnosis not present

## 2012-10-06 DIAGNOSIS — M75120 Complete rotator cuff tear or rupture of unspecified shoulder, not specified as traumatic: Secondary | ICD-10-CM | POA: Diagnosis not present

## 2012-10-08 DIAGNOSIS — M75120 Complete rotator cuff tear or rupture of unspecified shoulder, not specified as traumatic: Secondary | ICD-10-CM | POA: Diagnosis not present

## 2012-10-08 DIAGNOSIS — M25519 Pain in unspecified shoulder: Secondary | ICD-10-CM | POA: Diagnosis not present

## 2012-10-12 DIAGNOSIS — M25519 Pain in unspecified shoulder: Secondary | ICD-10-CM | POA: Diagnosis not present

## 2012-10-12 DIAGNOSIS — M75120 Complete rotator cuff tear or rupture of unspecified shoulder, not specified as traumatic: Secondary | ICD-10-CM | POA: Diagnosis not present

## 2012-10-15 DIAGNOSIS — M25519 Pain in unspecified shoulder: Secondary | ICD-10-CM | POA: Diagnosis not present

## 2012-10-15 DIAGNOSIS — M75120 Complete rotator cuff tear or rupture of unspecified shoulder, not specified as traumatic: Secondary | ICD-10-CM | POA: Diagnosis not present

## 2012-10-20 DIAGNOSIS — M25519 Pain in unspecified shoulder: Secondary | ICD-10-CM | POA: Diagnosis not present

## 2012-10-20 DIAGNOSIS — M75120 Complete rotator cuff tear or rupture of unspecified shoulder, not specified as traumatic: Secondary | ICD-10-CM | POA: Diagnosis not present

## 2012-10-22 DIAGNOSIS — M75120 Complete rotator cuff tear or rupture of unspecified shoulder, not specified as traumatic: Secondary | ICD-10-CM | POA: Diagnosis not present

## 2012-10-22 DIAGNOSIS — M25519 Pain in unspecified shoulder: Secondary | ICD-10-CM | POA: Diagnosis not present

## 2012-10-27 DIAGNOSIS — M75120 Complete rotator cuff tear or rupture of unspecified shoulder, not specified as traumatic: Secondary | ICD-10-CM | POA: Diagnosis not present

## 2012-10-27 DIAGNOSIS — M25519 Pain in unspecified shoulder: Secondary | ICD-10-CM | POA: Diagnosis not present

## 2012-11-03 DIAGNOSIS — M25519 Pain in unspecified shoulder: Secondary | ICD-10-CM | POA: Diagnosis not present

## 2012-11-03 DIAGNOSIS — M75120 Complete rotator cuff tear or rupture of unspecified shoulder, not specified as traumatic: Secondary | ICD-10-CM | POA: Diagnosis not present

## 2012-11-05 DIAGNOSIS — M25519 Pain in unspecified shoulder: Secondary | ICD-10-CM | POA: Diagnosis not present

## 2012-11-05 DIAGNOSIS — M75120 Complete rotator cuff tear or rupture of unspecified shoulder, not specified as traumatic: Secondary | ICD-10-CM | POA: Diagnosis not present

## 2012-11-06 ENCOUNTER — Encounter: Payer: Self-pay | Admitting: Internal Medicine

## 2012-11-06 ENCOUNTER — Ambulatory Visit (INDEPENDENT_AMBULATORY_CARE_PROVIDER_SITE_OTHER): Payer: Medicare Other | Admitting: Internal Medicine

## 2012-11-06 VITALS — BP 152/90 | HR 80 | Temp 97.7°F | Wt 168.0 lb

## 2012-11-06 DIAGNOSIS — B372 Candidiasis of skin and nail: Secondary | ICD-10-CM

## 2012-11-06 DIAGNOSIS — L259 Unspecified contact dermatitis, unspecified cause: Secondary | ICD-10-CM | POA: Diagnosis not present

## 2012-11-06 DIAGNOSIS — B029 Zoster without complications: Secondary | ICD-10-CM | POA: Diagnosis not present

## 2012-11-06 MED ORDER — PREGABALIN 50 MG PO CAPS: 50.0000 mg | ORAL_CAPSULE | Freq: Three times a day (TID) | ORAL | Status: DC

## 2012-11-06 MED ORDER — TRIAMCINOLONE ACETONIDE 0.1 % EX CREA: TOPICAL_CREAM | Freq: Two times a day (BID) | CUTANEOUS | Status: DC

## 2012-11-06 MED ORDER — VALACYCLOVIR HCL 1 G PO TABS: 1000.0000 mg | ORAL_TABLET | Freq: Three times a day (TID) | ORAL | Status: DC

## 2012-11-06 MED ORDER — NYSTATIN 100000 UNIT/GM EX POWD: CUTANEOUS | Status: DC

## 2012-11-06 NOTE — Patient Instructions (Signed)
It was good to see you today. Valtrex 3 times daily for one week and Lyrica as needed for shingles pain Triamcinolone steroid cream to right face as needed for poison ivy dermatitis Nystatin powder beneath breasts for yeast infection as needed Your prescription(s) have been submitted to your pharmacy. Please take as directed and contact our office if you believe you are having problem(s) with the medication(s). Call if symptoms worse or unimproved

## 2012-11-06 NOTE — Progress Notes (Addendum)
  Subjective:    Patient ID: [name], female    DOB: 09-21-1933, 79 y.o.   MRN: [id number]  HPI  complains of rash - 2-3 different kinds?  Shingles on neck - hx same x 5 episodes in past - associated with burning Also under L breast and r face  Past Medical History  Diagnosis Date  . ARTHRITIS     s/p bilateral THR  . MIGRAINE HEADACHE   . Interstitial cystitis   . ALLERGIC RHINITIS   . ANEMIA, IRON DEFICIENCY   . ANXIETY   . ASTHMA, EXTRINSIC   . HYPERTENSION   . HYPERLIPIDEMIA   . G E R D   . DIABETES MELLITUS, TYPE II   . FIBROMYALGIA     fibromyalgia  . Chronic constipation     Review of Systems  Constitutional: Positive for fatigue. Negative for fever.  Skin: Negative for color change and wound.  Hematological: Negative for adenopathy. Does not bruise/bleed easily.  Psychiatric/Behavioral: Negative for suicidal ideas, sleep disturbance and self-injury. The patient is not nervous/anxious.        Objective:   Physical Exam  BP 152/90  Pulse 80  Temp(Src) 97.7 F (36.5 C) (Oral)  Wt 168 lb (76.204 kg)  BMI 27.96 kg/m2  SpO2 97% Wt Readings from Last 3 Encounters:  11/06/12 168 lb (76.204 kg)  08/11/12 166 lb (75.297 kg)  05/21/12 167 lb 12.8 oz (76.114 kg)   Constitutional: She appears well-developed and well-nourished. No distress. nontoxic Neck: Normal range of motion. Neck supple. No JVD present. No thyromegaly present.  Cardiovascular: Normal rate, regular rhythm and normal heart sounds.  No murmur heard. No BLE edema. Pulmonary/Chest: Effort normal and breath sounds normal. No respiratory distress. She has no wheezes.  Skin: contact dermatitis in 2 cm patch over right jaw. Vesicular rash of herpetic nature over left shoulder and neck. Candidiasis changes under left breast - Psychiatric: She has a normal mood and affect. Her behavior is normal. Judgment and thought content normal.   Lab Results  Component Value Date   WBC 6.1 05/20/2011   HGB  12.6 05/20/2011   HCT 37.5 05/20/2011   PLT 156 05/20/2011   GLUCOSE 110* 05/20/2011   CHOL 184 05/21/2012   TRIG 117.0 05/21/2012   HDL 60.30 05/21/2012   LDLDIRECT 195.1 04/12/2011   LDLCALC 100* 05/21/2012   ALT 20 05/20/2011   AST 23 05/20/2011   NA 142 05/20/2011   K 4.0 05/20/2011   CL 105 05/20/2011   CREATININE 0.90 05/20/2011   BUN 14 05/20/2011   CO2 27 05/20/2011   TSH 1.67 04/12/2011   INR 0.9 04/12/2011   HGBA1C 6.1 05/21/2012       Assessment & Plan:   Recurrent herpetic outbreak, left shoulder and neck - Valtrex x1 week prescribed with Lyrica for pain  Contact dermatitis right mandible. Presumed poison ivy contact from patient's yard by history - topical cortisone and education provided  Candidiasis, beneath right breast - nystatin powder prescribed

## 2012-11-10 ENCOUNTER — Encounter: Payer: Self-pay | Admitting: Internal Medicine

## 2012-11-10 DIAGNOSIS — M75120 Complete rotator cuff tear or rupture of unspecified shoulder, not specified as traumatic: Secondary | ICD-10-CM | POA: Diagnosis not present

## 2012-11-10 DIAGNOSIS — M25519 Pain in unspecified shoulder: Secondary | ICD-10-CM | POA: Diagnosis not present

## 2012-11-11 DIAGNOSIS — Z9889 Other specified postprocedural states: Secondary | ICD-10-CM | POA: Diagnosis not present

## 2012-11-17 DIAGNOSIS — M25519 Pain in unspecified shoulder: Secondary | ICD-10-CM | POA: Diagnosis not present

## 2012-11-17 DIAGNOSIS — M75120 Complete rotator cuff tear or rupture of unspecified shoulder, not specified as traumatic: Secondary | ICD-10-CM | POA: Diagnosis not present

## 2012-11-18 ENCOUNTER — Encounter: Payer: Self-pay | Admitting: Gastroenterology

## 2012-11-19 ENCOUNTER — Encounter: Payer: Self-pay | Admitting: Internal Medicine

## 2012-11-19 ENCOUNTER — Ambulatory Visit (INDEPENDENT_AMBULATORY_CARE_PROVIDER_SITE_OTHER): Payer: Medicare Other | Admitting: Internal Medicine

## 2012-11-19 ENCOUNTER — Other Ambulatory Visit (INDEPENDENT_AMBULATORY_CARE_PROVIDER_SITE_OTHER): Payer: Medicare Other

## 2012-11-19 VITALS — BP 132/84 | HR 74 | Temp 97.7°F | Wt 165.4 lb

## 2012-11-19 DIAGNOSIS — R5383 Other fatigue: Secondary | ICD-10-CM

## 2012-11-19 DIAGNOSIS — Z0000 Encounter for general adult medical examination without abnormal findings: Secondary | ICD-10-CM

## 2012-11-19 DIAGNOSIS — Z23 Encounter for immunization: Secondary | ICD-10-CM

## 2012-11-19 DIAGNOSIS — F411 Generalized anxiety disorder: Secondary | ICD-10-CM | POA: Diagnosis not present

## 2012-11-19 DIAGNOSIS — R5381 Other malaise: Secondary | ICD-10-CM

## 2012-11-19 DIAGNOSIS — E785 Hyperlipidemia, unspecified: Secondary | ICD-10-CM | POA: Diagnosis not present

## 2012-11-19 DIAGNOSIS — E119 Type 2 diabetes mellitus without complications: Secondary | ICD-10-CM

## 2012-11-19 DIAGNOSIS — IMO0001 Reserved for inherently not codable concepts without codable children: Secondary | ICD-10-CM

## 2012-11-19 LAB — CBC WITH DIFFERENTIAL/PLATELET
Basophils Relative: 0.2 % (ref 0.0–3.0)
Hemoglobin: 13.4 g/dL (ref 12.0–15.0)
Lymphocytes Relative: 20.6 % (ref 12.0–46.0)
Monocytes Relative: 8.4 % (ref 3.0–12.0)
Neutro Abs: 5.2 10*3/uL (ref 1.4–7.7)
RBC: 4.32 Mil/uL (ref 3.87–5.11)
WBC: 7.7 10*3/uL (ref 4.5–10.5)

## 2012-11-19 LAB — MICROALBUMIN / CREATININE URINE RATIO
Creatinine,U: 93 mg/dL
Microalb Creat Ratio: 0.4 mg/g (ref 0.0–30.0)

## 2012-11-19 MED ORDER — PIOGLITAZONE HCL 30 MG PO TABS: 15.0000 mg | ORAL_TABLET | Freq: Every day | ORAL | Status: DC

## 2012-11-19 MED ORDER — VALSARTAN 160 MG PO TABS: 160.0000 mg | ORAL_TABLET | Freq: Every day | ORAL | Status: DC

## 2012-11-19 MED ORDER — CLONAZEPAM 0.5 MG PO TABS: 0.5000 mg | ORAL_TABLET | Freq: Every day | ORAL | Status: DC | PRN

## 2012-11-19 MED ORDER — PAROXETINE HCL 20 MG PO TABS: 20.0000 mg | ORAL_TABLET | ORAL | Status: DC

## 2012-11-19 MED ORDER — PREGABALIN 25 MG PO CAPS: 25.0000 mg | ORAL_CAPSULE | Freq: Every day | ORAL | Status: DC

## 2012-11-19 MED ORDER — MELOXICAM 15 MG PO TABS: 15.0000 mg | ORAL_TABLET | Freq: Every day | ORAL | Status: DC

## 2012-11-19 MED ORDER — ROSUVASTATIN CALCIUM 20 MG PO TABS: 20.0000 mg | ORAL_TABLET | Freq: Every day | ORAL | Status: DC

## 2012-11-19 MED ORDER — OMEPRAZOLE 20 MG PO CPDR: 20.0000 mg | DELAYED_RELEASE_CAPSULE | Freq: Every day | ORAL | Status: DC

## 2012-11-19 NOTE — Assessment & Plan Note (Signed)
Resumed simva 05/2010 - caused leg pains Changed to crestor 20 every other day 08/2010 -  minimally compliant due to side effects, but resumed regular effort at compliance with same fall 2013 Reviewed last lipids - No new rx change recommended today

## 2012-11-19 NOTE — Assessment & Plan Note (Signed)
Intolerant of metformin due to diarrhea Continue actos - check a1c and titrate if needed On ARB and statin, regular eye exams The current medical regimen is effective;  continue present plan and medications.  Lab Results  Component Value Date   HGBA1C 6.1 05/21/2012

## 2012-11-19 NOTE — Patient Instructions (Signed)
It was good to see you today. We have reviewed your prior records including labs and tests today Health Maintenance reviewed - all recommended immunizations and age-appropriate screenings are up-to-date. Test(s) ordered today. Your results will be released to MyChart (or called to you) after review, usually within 72hours after test completion. If any changes need to be made, you will be notified at that same time. Medications reviewed and updated, reduce Lyrica 25 mg daily-no other changes recommended at this time. Refill on medication(s) as discussed today. Please schedule followup in 6 months, call sooner if problems.

## 2012-11-19 NOTE — Progress Notes (Signed)
Subjective:    Patient ID: [name], female    DOB: [birth year], 79 y.o.   MRN: [id number]  HPI  Here for medicare wellness  Diet: heart healthy and diabetic Physical activity: sedentary Depression/mood screen: negative Hearing: intact to whispered voice Visual acuity: grossly normal, performs annual eye exam  ADLs: capable Fall risk: none Home safety: good Cognitive evaluation: intact to orientation, naming, recall and repetition EOL planning: adv directives, full code/ I agree  I have personally reviewed and have noted 1. The patient's medical and social history 2. Their use of alcohol, tobacco or illicit drugs 3. Their current medications and supplements 4. The patient's functional ability including ADL's, fall risks, home safety risks and hearing or visual impairment. 5. Diet and physical activities 6. Evidence for depression or mood disorders  also reviewed chronic medical issues:  anxiety and depression - stable despite family stressors (mother's death, sister's dementia and other family illness). no sadness/SI/HI but increase in chronic poor sleep issues - last flare depression spring 2012 precipitated by stress (illness and hosp/rehab of spouse) - paxil dose increased 06/2010 - improved, no SI/HI - also using prn clonazepam for same with good relief -  DM2 - intol of metformin due to side effects - on actos without change since that time. does not check cbgs at home. Occasionally "shaky and sweaty" before meals (1-2x/wk)  dyslipidemia - prev on simva (stopped 01/2010 due to generalized myalgias) - no significant improvement in symptoms off med, but concerned about uncontrolled LDL without statin - tried to resume simva 06/2010 but recurrent myalgia  - in 08/2010, began crestor at qod - variable compliance due to myalgias  fibromyalgia - increase in diffuse pain with increase emotional stress - trial Lyrica 10/2012 as for herpetic neuralgia: 50mg  too strong; ?try lower  dose  Past Medical History  Diagnosis Date  . ARTHRITIS     s/p bilateral THR  . MIGRAINE HEADACHE   . Interstitial cystitis   . ALLERGIC RHINITIS   . ANEMIA, IRON DEFICIENCY   . ANXIETY   . ASTHMA, EXTRINSIC   . HYPERTENSION   . HYPERLIPIDEMIA   . G E R D   . DIABETES MELLITUS, TYPE II   . FIBROMYALGIA     fibromyalgia  . Chronic constipation     Review of Systems  Constitutional: Positive for fatigue. Negative for fever and unexpected weight change.  Respiratory: Negative for cough and shortness of breath.   Cardiovascular: Negative for chest pain and palpitations.  Neurological: Negative for weakness and headaches.       Objective:   Physical Exam BP 132/84  Pulse 74  Temp(Src) 97.7 F (36.5 C) (Oral)  Wt 165 lb 6.4 oz (75.025 kg)  BMI 27.52 kg/m2  SpO2 96% Wt Readings from Last 3 Encounters:  11/19/12 165 lb 6.4 oz (75.025 kg)  11/06/12 168 lb (76.204 kg)  08/11/12 166 lb (75.297 kg)   Constitutional: She appears well-developed and well-nourished. No distress. Spouse at side Neck: Normal range of motion. Neck supple. No JVD or LAD present. No thyromegaly present.  Cardiovascular: Normal rate, regular rhythm and normal heart sounds.  No murmur heard. No BLE edema. Pulmonary/Chest: Effort normal and breath sounds normal. No respiratory distress. She has no wheezes.  Psychiatric: She has a normal mood and affect. Her behavior is normal. Judgment and thought content normal.    Lab Results  Component Value Date   WBC 6.1 05/20/2011   HGB 12.6 05/20/2011   HCT 37.5  05/20/2011   PLT 156 05/20/2011   CHOL 184 05/21/2012   TRIG 117.0 05/21/2012   HDL 60.30 05/21/2012   LDLDIRECT 195.1 04/12/2011   ALT 20 05/20/2011   AST 23 05/20/2011   NA 142 05/20/2011   K 4.0 05/20/2011   CL 105 05/20/2011   CREATININE 0.90 05/20/2011   BUN 14 05/20/2011   CO2 27 05/20/2011   TSH 1.67 04/12/2011   INR 0.9 04/12/2011   HGBA1C 6.1 05/21/2012        Assessment & Plan:   AWV/v70.0  - Today patient counseled on age appropriate routine health concerns for screening and prevention, each reviewed and up to date or declined. Immunizations reviewed and up to date or declined. Labs/ECG reviewed. Risk factors for depression reviewed and negative. Hearing function and visual acuity are intact. ADLs screened and addressed as needed. Functional ability and level of safety reviewed and appropriate. Education, counseling and referrals performed based on assessed risks today. Patient provided with a copy of personalized plan for preventive services.  Fatigue - nonspecific symptoms/exam - check screening labs  Also see problem list. Medications and labs reviewed today.

## 2012-11-19 NOTE — Assessment & Plan Note (Signed)
Dx 1998 - exac by emotional stressors and weather Prior treatment with amitriptyline poorly tolerated due to side effects  Will add low dose Lyrica to ongoing paxil/clonazeam - consider cymbalta in place of paxil if lyrica poorly tolerated

## 2012-11-19 NOTE — Assessment & Plan Note (Signed)
inc symptoms 06/2010 precipitated by illness of spouse - increased dose paxil cr to bid spring 2012 and doing well Changed SSRI to short acting form, slightly lower dose 08/2010 - continue same

## 2012-11-20 ENCOUNTER — Other Ambulatory Visit: Payer: Self-pay | Admitting: Critical Care Medicine

## 2012-11-20 LAB — HEPATIC FUNCTION PANEL
Alkaline Phosphatase: 55 U/L (ref 39–117)
Bilirubin, Direct: 0 mg/dL (ref 0.0–0.3)
Total Protein: 6.7 g/dL (ref 6.0–8.3)

## 2012-11-20 LAB — BASIC METABOLIC PANEL
GFR: 76.01 mL/min (ref >60.00)
Glucose, Bld: 87 mg/dL (ref 70–99)
Potassium: 4.8 mEq/L (ref 3.5–5.1)
Sodium: 140 mEq/L (ref 135–145)

## 2012-11-20 LAB — TSH: TSH: 1.58 u[IU]/mL (ref 0.35–5.50)

## 2012-12-03 ENCOUNTER — Encounter: Payer: Self-pay | Admitting: Internal Medicine

## 2012-12-08 DIAGNOSIS — M25519 Pain in unspecified shoulder: Secondary | ICD-10-CM | POA: Diagnosis not present

## 2012-12-08 DIAGNOSIS — M75120 Complete rotator cuff tear or rupture of unspecified shoulder, not specified as traumatic: Secondary | ICD-10-CM | POA: Diagnosis not present

## 2012-12-10 DIAGNOSIS — M25519 Pain in unspecified shoulder: Secondary | ICD-10-CM | POA: Diagnosis not present

## 2012-12-15 DIAGNOSIS — M75120 Complete rotator cuff tear or rupture of unspecified shoulder, not specified as traumatic: Secondary | ICD-10-CM | POA: Diagnosis not present

## 2012-12-15 DIAGNOSIS — M25519 Pain in unspecified shoulder: Secondary | ICD-10-CM | POA: Diagnosis not present

## 2012-12-16 DIAGNOSIS — M545 Low back pain: Secondary | ICD-10-CM | POA: Diagnosis not present

## 2012-12-22 ENCOUNTER — Ambulatory Visit: Payer: Medicare Other | Admitting: Critical Care Medicine

## 2012-12-22 DIAGNOSIS — M25519 Pain in unspecified shoulder: Secondary | ICD-10-CM | POA: Diagnosis not present

## 2012-12-22 DIAGNOSIS — M75120 Complete rotator cuff tear or rupture of unspecified shoulder, not specified as traumatic: Secondary | ICD-10-CM | POA: Diagnosis not present

## 2012-12-28 DIAGNOSIS — M75120 Complete rotator cuff tear or rupture of unspecified shoulder, not specified as traumatic: Secondary | ICD-10-CM | POA: Diagnosis not present

## 2012-12-28 DIAGNOSIS — M25519 Pain in unspecified shoulder: Secondary | ICD-10-CM | POA: Diagnosis not present

## 2013-01-07 ENCOUNTER — Encounter: Payer: Self-pay | Admitting: Critical Care Medicine

## 2013-01-07 ENCOUNTER — Ambulatory Visit (INDEPENDENT_AMBULATORY_CARE_PROVIDER_SITE_OTHER): Payer: Medicare Other | Admitting: Critical Care Medicine

## 2013-01-07 ENCOUNTER — Telehealth: Payer: Self-pay | Admitting: *Deleted

## 2013-01-07 VITALS — BP 116/78 | HR 81 | Ht 65.6 in | Wt 161.6 lb

## 2013-01-07 DIAGNOSIS — J45909 Unspecified asthma, uncomplicated: Secondary | ICD-10-CM

## 2013-01-07 NOTE — Telephone Encounter (Signed)
Message copied by Deatra James on Thu Jan 07, 2013  1:25 PM ------      Message from: Rene Paci A      Created: Thu Jan 07, 2013 12:51 PM       Dennie Bible - Yes - I will have my office call to arrange nurse visit to administer this            Valentina Gu - please arrange same.            Thanks!            VAL            ----- Message -----         From: Storm Frisk, MD         Sent: 01/07/2013  10:30 AM           To: Newt Lukes, MD            Can you give ms [place] a prevnar 13 vaccine?            We do not have this yet and are trying to get this             pw       ------

## 2013-01-07 NOTE — Patient Instructions (Signed)
You need Prevnar 13 pneumonia vaccine per Leschber No other medication changes Return 4 months

## 2013-01-07 NOTE — Progress Notes (Signed)
Subjective:    Patient ID: [name], female    DOB: 24-Oct-1933, 79 y.o.   MRN: [id number]  HPI  sthma Her past medical history is significant for asthma.   79 y.o.    female who presents with asthma with reflux disease   08/11/2012 Notes more chest tightness. No mucus.  More dyspnea. Using rescue inhaler more. PUL ASTHMA HISTORY 01/07/2013 08/11/2012 03/13/2012 01/06/2012 10/16/2011  Symptoms 0-2 days/week Daily Daily 0-2 days/week Daily  Nighttime awakenings 0-2/month 3-4/month 0-2/month 0-2/month 0-2/month  Interference with activity No limitations No limitations No limitations No limitations Some limitations  SABA use 0-2 days/wk Daily 0-2 days/wk 0-2 days/wk Daily  Exacerbations requiring oral steroids 0-1 / year 2 or more / year 0-1 / year 0-1 / year 0-1 / year    01/07/2013 Chief Complaint  Patient presents with  . 4 month follow up    Breathing doing well overall.  Does have runny nose with clear drainage.  No SOB, wheezing, chest tightness, chest pain, or cough at this time.  Has been doing well overall pulm wise.  Pt had rotator cuff surgery L. Supple. Pt fell and dislocated 03/2012.  Now low back issues, L 4-5 L2-3. Ruptured discs. Spinal stenosis.  Injections per Ramos not as helpful.     Review of Systems  Constitutional:   No  weight loss, night sweats,  Fevers, chills, fatigue, lassitude. HEENT:   No headaches,  Difficulty swallowing,  Tooth/dental problems,  Sore throat,                No sneezing, itching, ear ache, nasal congestion, post nasal drip,   CV:  No chest pain,  Orthopnea, PND, swelling in lower extremities, anasarca, dizziness, palpitations  GI  No heartburn, indigestion, abdominal pain, nausea, vomiting, diarrhea, change in bowel habits, loss of appetite  Resp: ++ shortness of breath with exertion not  at rest.  No excess mucus, no productive cough,  No non-productive cough,  No coughing up of blood.  No change in color of mucus.  No wheezing.   No chest wall deformity  Skin: no rash or lesions.  GU: no dysuria, change in color of urine, no urgency or frequency.  No flank pain.  MS:  No joint pain or swelling.  No decreased range of motion.  No back pain.  Psych:  No change in mood or affect. No depression or anxiety.  No memory loss.     Objective:   Physical Exam  Filed Vitals:   01/07/13 0950  BP: 116/78  Pulse: 81  Height: 5' 5.6" (1.666 m)  Weight: 161 lb 9.6 oz (73.301 kg)  SpO2: 98%    Gen: Pleasant, well-nourished, in no distress,  normal affect  ENT: No lesions,  mouth clear,  oropharynx clear, no postnasal drip  Neck: No JVD, no TMG, no carotid bruits  Lungs: No use of accessory muscles, no dullness to percussion,Expired wheezes  Cardiovascular: RRR, heart sounds normal, no murmur or gallops, no peripheral edema  Abdomen: soft and NT, no HSM,  BS normal  Musculoskeletal: No deformities, no cyanosis or clubbing  Neuro: alert, non focal  Skin: Warm, no lesions or rashes  No results found.        Assessment & Plan:   Moderate persistent asthma with atopic features Moderate persistent asthma with significant atopic features stable at this time Plan Maintain inhaled medications as prescribed Would recommend Prevnar 13    Updated Medication List Outpatient Encounter Prescriptions as  of 01/07/2013  Medication Sig Dispense Refill  . acetaminophen (TYLENOL) 500 MG tablet Take 1,000 mg by mouth every 6 (six) hours as needed. For pain      . albuterol (PROAIR HFA) 108 (90 BASE) MCG/ACT inhaler Inhale 2 puffs into the lungs every 6 (six) hours as needed.  3 Inhaler  3  . budesonide-formoterol (SYMBICORT) 160-4.5 MCG/ACT inhaler Inhale 2 puffs into the lungs 2 (two) times daily.  1 Inhaler  6  . Calcium-Vitamin D (SUPER CALCIUM/D) 600-125 MG-UNIT TABS Take 1 tablet by mouth daily.       . cetirizine (ZYRTEC) 10 MG tablet Take 10 mg by mouth daily.       . Cholecalciferol (VITAMIN D3) 1000 UNITS  CAPS Take 1 capsule by mouth daily.       . clonazePAM (KLONOPIN) 0.5 MG tablet Take 1 tablet (0.5 mg total) by mouth daily as needed for anxiety.  90 tablet  1  . Docusate Sodium (STOOL SOFTENER) 100 MG capsule Take 100 mg by mouth daily.        . fluticasone (FLONASE) 50 MCG/ACT nasal spray USE 2 SPRAYS INTO EACH NOSTRIL EVERY DAY  16 g  5  . meloxicam (MOBIC) 15 MG tablet Take 1 tablet (15 mg total) by mouth daily.  90 tablet  1  . montelukast (SINGULAIR) 10 MG tablet Take 1 tablet (10 mg total) by mouth at bedtime.  90 tablet  3  . Multiple Vitamin (MULTIVITAMIN) tablet Take 1 tablet by mouth daily.        Marland Kitchen nystatin (MYCOSTATIN/NYSTOP) 100000 UNIT/GM POWD Apply BID prn  60 g  0  . omeprazole (PRILOSEC) 20 MG capsule Take 1 capsule (20 mg total) by mouth daily.  90 capsule  3  . PARoxetine (PAXIL) 20 MG tablet Take 1 tablet (20 mg total) by mouth every morning. Take 1 by mouth daily  90 tablet  3  . pioglitazone (ACTOS) 30 MG tablet Take 0.5 tablets (15 mg total) by mouth daily.  45 tablet  3  . pregabalin (LYRICA) 25 MG capsule Take 25 mg by mouth as needed.      . rosuvastatin (CRESTOR) 20 MG tablet Take 1 tablet (20 mg total) by mouth daily.  90 tablet  3  . traMADol (ULTRAM) 50 MG tablet as needed.      . triamcinolone cream (KENALOG) 0.1 % Apply topically 2 (two) times daily.  30 g  0  . valsartan (DIOVAN) 160 MG tablet Take 1 tablet (160 mg total) by mouth daily.  90 tablet  3  . zolpidem (AMBIEN) 10 MG tablet Take 10 mg by mouth at bedtime as needed.       . [DISCONTINUED] pregabalin (LYRICA) 25 MG capsule Take 1 capsule (25 mg total) by mouth daily.  30 capsule  0   No facility-administered encounter medications on file as of 01/07/2013.

## 2013-01-07 NOTE — Telephone Encounter (Signed)
Called pt inform her md wanted her to get set-up for nurse visit to get a prevnar. Transfer to schedulers to set up nurse visit...lmb

## 2013-01-08 NOTE — Assessment & Plan Note (Signed)
Moderate persistent asthma with significant atopic features stable at this time Plan Maintain inhaled medications as prescribed Would recommend Prevnar 13

## 2013-01-21 ENCOUNTER — Telehealth: Payer: Self-pay

## 2013-01-21 DIAGNOSIS — M25519 Pain in unspecified shoulder: Secondary | ICD-10-CM | POA: Diagnosis not present

## 2013-01-21 DIAGNOSIS — M75120 Complete rotator cuff tear or rupture of unspecified shoulder, not specified as traumatic: Secondary | ICD-10-CM | POA: Diagnosis not present

## 2013-01-21 NOTE — Telephone Encounter (Signed)
We did send her a recall letter.

## 2013-01-21 NOTE — Telephone Encounter (Signed)
If is it scheduled for screening please cancel. If she is referred for a new sign or symptoms please let me know.  Did we send a recall letter to her?

## 2013-01-21 NOTE — Telephone Encounter (Signed)
In 2011 pt's colonoscopy reads, No need for further colonoscopies as that one was normal.  She is scheduled for a previsit tomorrow.  Do you want me to cancel her procedure?

## 2013-01-21 NOTE — Telephone Encounter (Signed)
I called the pt , she's not having any issues.  Also we are putting in notes to prevent her from getting future recall letters.

## 2013-01-26 ENCOUNTER — Ambulatory Visit: Payer: Medicare Other

## 2013-01-28 ENCOUNTER — Encounter: Payer: Self-pay | Admitting: Cardiovascular Disease

## 2013-01-28 ENCOUNTER — Ambulatory Visit (INDEPENDENT_AMBULATORY_CARE_PROVIDER_SITE_OTHER): Payer: Medicare Other | Admitting: Cardiovascular Disease

## 2013-01-28 ENCOUNTER — Ambulatory Visit (INDEPENDENT_AMBULATORY_CARE_PROVIDER_SITE_OTHER): Payer: Medicare Other | Admitting: *Deleted

## 2013-01-28 VITALS — BP 168/88 | HR 78 | Ht 65.5 in | Wt 156.0 lb

## 2013-01-28 DIAGNOSIS — E785 Hyperlipidemia, unspecified: Secondary | ICD-10-CM

## 2013-01-28 DIAGNOSIS — I10 Essential (primary) hypertension: Secondary | ICD-10-CM | POA: Diagnosis not present

## 2013-01-28 DIAGNOSIS — Z23 Encounter for immunization: Secondary | ICD-10-CM | POA: Diagnosis not present

## 2013-01-28 NOTE — Assessment & Plan Note (Signed)
Blood pressure is elevated today despite antihypertensive medications because of chronic back pain.

## 2013-01-28 NOTE — Assessment & Plan Note (Signed)
On statin therapy with her most recent lipid profile performed 05/21/12 revealing a total cholesterol of 184, LDL of 100 and HDL of 60, adequate for primary prevention

## 2013-01-28 NOTE — Patient Instructions (Signed)
Your physician wants you to follow-up in: 1 year with Dr Berry. You will receive a reminder letter in the mail two months in advance. If you don't receive a letter, please call our office to schedule the follow-up appointment.  

## 2013-01-28 NOTE — Progress Notes (Signed)
01/28/2013 [name]   09/25/1933  [id number]  Primary Physician Rene Paci, MD Primary Cardiologist: Runell Gess MD [name]   HPI:  The patient returns today for followup with her husband, [name]. I saw them both one year  ago. She is a 79 year old mildly-overweight married Caucasian female, mother of 3 and grandmother of 1 grandchild, who works as a Conservator, museum/gallery. Her risk factors include type 2 diabetes, hypertension, and hyperlipidemia. She does have fibromyalgia. She has had a normal 2D echo and Myoview stress test in the past. She is otherwise asymptomatic. Dr. Felicity Coyer follows her laboratories.   Her last lipid profile, performed 05/21/12 revealed a total cholesterol 24, LDL 100 HDL of 60 on Crestor 20 mg a day.     Current Outpatient Prescriptions  Medication Sig Dispense Refill  . acetaminophen (TYLENOL) 500 MG tablet Take 1,000 mg by mouth every 6 (six) hours as needed. For pain      . albuterol (PROAIR HFA) 108 (90 BASE) MCG/ACT inhaler Inhale 2 puffs into the lungs every 6 (six) hours as needed.  3 Inhaler  3  . budesonide-formoterol (SYMBICORT) 160-4.5 MCG/ACT inhaler Inhale 2 puffs into the lungs 2 (two) times daily.  1 Inhaler  6  . Calcium-Vitamin D (SUPER CALCIUM/D) 600-125 MG-UNIT TABS Take 1 tablet by mouth daily.       . cetirizine (ZYRTEC) 10 MG tablet Take 10 mg by mouth daily.       . Cholecalciferol (VITAMIN D3) 1000 UNITS CAPS Take 1 capsule by mouth daily.       . clonazePAM (KLONOPIN) 0.5 MG tablet Take 1 tablet (0.5 mg total) by mouth daily as needed for anxiety.  90 tablet  1  . Docusate Sodium (STOOL SOFTENER) 100 MG capsule Take 100 mg by mouth daily.        . fluticasone (FLONASE) 50 MCG/ACT nasal spray USE 2 SPRAYS INTO EACH NOSTRIL EVERY DAY  16 g  5  . meloxicam (MOBIC) 15 MG tablet Take 1 tablet (15 mg total) by mouth daily.  90 tablet  1  . montelukast (SINGULAIR) 10 MG tablet Take 1 tablet (10  mg total) by mouth at bedtime.  90 tablet  3  . Multiple Vitamin (MULTIVITAMIN) tablet Take 1 tablet by mouth daily.        Marland Kitchen nystatin (MYCOSTATIN/NYSTOP) 100000 UNIT/GM POWD Apply BID prn  60 g  0  . omeprazole (PRILOSEC) 20 MG capsule Take 1 capsule (20 mg total) by mouth daily.  90 capsule  3  . PARoxetine (PAXIL) 20 MG tablet Take 20 mg by mouth daily.      . pioglitazone (ACTOS) 30 MG tablet Take 0.5 tablets (15 mg total) by mouth daily.  45 tablet  3  . rosuvastatin (CRESTOR) 20 MG tablet Take 1 tablet (20 mg total) by mouth daily.  90 tablet  3  . traMADol (ULTRAM) 50 MG tablet as needed.      . triamcinolone cream (KENALOG) 0.1 % Apply topically 2 (two) times daily.  30 g  0  . valsartan (DIOVAN) 160 MG tablet Take 1 tablet (160 mg total) by mouth daily.  90 tablet  3   No current facility-administered medications for this visit.    Allergies  Allergen Reactions  . Morphine Sulfate Other (See Comments)    Makes her hyper    History   Social History  . Marital Status: Married    Spouse Name: N/A  Number of Children: N/A  . Years of Education: N/A   Occupational History  . Not on file.   Social History Main Topics  . Smoking status: Former Smoker -- 0.30 packs/day for 10 years    Types: Cigarettes    Quit date: 03/25/1968  . Smokeless tobacco: Never Used     Comment: Married with two children. Retired Scientist, clinical (histocompatibility and immunogenetics)  . Alcohol Use: No  . Drug Use: No  . Sexual Activity: Not on file   Other Topics Concern  . Not on file   Social History Narrative  . No narrative on file     Review of Systems: General: negative for chills, fever, night sweats or weight changes.  Cardiovascular: negative for chest pain, dyspnea on exertion, edema, orthopnea, palpitations, paroxysmal nocturnal dyspnea or shortness of breath Dermatological: negative for rash Respiratory: negative for cough or wheezing Urologic: negative for hematuria Abdominal: negative for nausea, vomiting,  diarrhea, bright red blood per rectum, melena, or hematemesis Neurologic: negative for visual changes, syncope, or dizziness All other systems reviewed and are otherwise negative except as noted above.    Blood pressure 168/88, pulse 78, height 5' 5.5" (1.664 m), weight 156 lb (70.761 kg).  General appearance: alert and no distress Neck: no adenopathy, no carotid bruit, no JVD, supple, symmetrical, trachea midline and thyroid not enlarged, symmetric, no tenderness/mass/nodules Lungs: clear to auscultation bilaterally Heart: regular rate and rhythm, S1, S2 normal, no murmur, click, rub or gallop Extremities: extremities normal, atraumatic, no cyanosis or edema  EKG sinus rhythm at 78 with left bundle branch block unchanged from prior EKGs  ASSESSMENT AND PLAN:   HYPERTENSION Blood pressure is elevated today despite antihypertensive medications because of chronic back pain.  HYPERLIPIDEMIA On statin therapy with her most recent lipid profile performed 05/21/12 revealing a total cholesterol of 184, LDL of 100 and HDL of 60, adequate for primary prevention      Runell Gess MD [facility], [facility] 01/28/2013 11:39 AM

## 2013-02-02 DIAGNOSIS — M961 Postlaminectomy syndrome, not elsewhere classified: Secondary | ICD-10-CM | POA: Diagnosis not present

## 2013-02-02 DIAGNOSIS — M48062 Spinal stenosis, lumbar region with neurogenic claudication: Secondary | ICD-10-CM | POA: Diagnosis not present

## 2013-02-02 DIAGNOSIS — M4716 Other spondylosis with myelopathy, lumbar region: Secondary | ICD-10-CM | POA: Diagnosis not present

## 2013-02-02 DIAGNOSIS — M5430 Sciatica, unspecified side: Secondary | ICD-10-CM | POA: Diagnosis not present

## 2013-02-05 ENCOUNTER — Encounter: Payer: Medicare Other | Admitting: Gastroenterology

## 2013-02-13 ENCOUNTER — Encounter: Payer: Self-pay | Admitting: Internal Medicine

## 2013-02-15 MED ORDER — PREGABALIN 25 MG PO CAPS: 25.0000 mg | ORAL_CAPSULE | Freq: Every day | ORAL | Status: DC

## 2013-02-23 DIAGNOSIS — M545 Low back pain: Secondary | ICD-10-CM | POA: Diagnosis not present

## 2013-03-02 ENCOUNTER — Other Ambulatory Visit: Payer: Self-pay | Admitting: Critical Care Medicine

## 2013-03-02 ENCOUNTER — Ambulatory Visit (INDEPENDENT_AMBULATORY_CARE_PROVIDER_SITE_OTHER): Payer: Medicare Other | Admitting: Pulmonary Disease

## 2013-03-02 ENCOUNTER — Encounter: Payer: Self-pay | Admitting: Pulmonary Disease

## 2013-03-02 VITALS — BP 132/84 | HR 68 | Ht 65.0 in | Wt 157.0 lb

## 2013-03-02 DIAGNOSIS — J45901 Unspecified asthma with (acute) exacerbation: Secondary | ICD-10-CM | POA: Diagnosis not present

## 2013-03-02 MED ORDER — PREDNISONE 20 MG PO TABS: 20.0000 mg | ORAL_TABLET | Freq: Every day | ORAL | Status: AC

## 2013-03-02 MED ORDER — BUDESONIDE-FORMOTEROL FUMARATE 160-4.5 MCG/ACT IN AERO: 2.0000 | INHALATION_SPRAY | Freq: Two times a day (BID) | RESPIRATORY_TRACT | Status: DC

## 2013-03-02 NOTE — Assessment & Plan Note (Signed)
She is having a mild flare of Asthma due to an upper respiratory infection (likely viral).  Some of this is probably also related to Symbicort non-compliance.  Plan: -encouraged her to use symbicort regularly -prednisone short course -reviewed conservative therapy (ie. OTC meds) -if no improvement or if worse, amoxicillin 500mg  po q8h written Rx given

## 2013-03-02 NOTE — Patient Instructions (Signed)
Take the prednisone 20mg  dailiy for 5 days If you don't see improvement in the next 2-3 days or if you get worse, start taking the amoxicillin Use over the counter nasal decongestants like pseudoephedrine or phenylephrine as needed for sinus congestion We will see you back as previously scheduled with Dr. Delford Field

## 2013-03-02 NOTE — Progress Notes (Signed)
Subjective:    Patient ID: [name], female    DOB: [birth year], 79 y.o.   MRN: [id number]  HPI  03/02/2013 acute visit> This is a patient of Dr. Florene Route who has moderate persistent asthma hwo comes to see me today for concern she is developing bronchitis.  Two days ago she developed a sore throat, nose congestion, and chest tightness.  She had yellow nasal mucus but no sputum production. Her husband has a sinus infection right now and is on antibiotics for it right now.   She notes that she is not completely compliant with the Symbicort.  She may take it ten times in a week.  She forgets when she feels good.  Past Medical History  Diagnosis Date  . ARTHRITIS     s/p bilateral THR  . MIGRAINE HEADACHE   . Interstitial cystitis   . ALLERGIC RHINITIS   . ANEMIA, IRON DEFICIENCY   . ANXIETY   . ASTHMA, EXTRINSIC   . HYPERTENSION   . HYPERLIPIDEMIA   . G E R D   . DIABETES MELLITUS, TYPE II   . FIBROMYALGIA     fibromyalgia  . Chronic constipation      Review of Systems     Objective:   Physical Exam Filed Vitals:   03/02/13 1026  BP: 132/84  Pulse: 68  Height: 5\' 5"  (1.651 m)  Weight: 157 lb (71.215 kg)  SpO2: 98%   RA  Gen:  no acute distress HEENT: NCAT, EOMi, OP with mild soft pallate erythema, no exudate, neck supple without masses PULM: rhonchi R base, slight wheeze left base CV: RRR, no mgr, no JVD AB: BS+, soft, nontender, no hsm Ext: warm, no cyanosis     Assessment & Plan:   Asthma with acute exacerbation She is having a mild flare of Asthma due to an upper respiratory infection (likely viral).  Some of this is probably also related to Symbicort non-compliance.  Plan: -encouraged her to use symbicort regularly -prednisone short course -reviewed conservative therapy (ie. OTC meds) -if no improvement or if worse, amoxicillin 500mg  po q8h written Rx given    Updated Medication List Outpatient Encounter Prescriptions as of 03/02/2013   Medication Sig  . acetaminophen (TYLENOL) 500 MG tablet Take 1,000 mg by mouth every 6 (six) hours as needed. For pain  . albuterol (PROAIR HFA) 108 (90 BASE) MCG/ACT inhaler Inhale 2 puffs into the lungs every 6 (six) hours as needed.  . budesonide-formoterol (SYMBICORT) 160-4.5 MCG/ACT inhaler Inhale 2 puffs into the lungs 2 (two) times daily.  . Calcium-Vitamin D (SUPER CALCIUM/D) 600-125 MG-UNIT TABS Take 1 tablet by mouth daily.   . cetirizine (ZYRTEC) 10 MG tablet Take 10 mg by mouth daily.   . Cholecalciferol (VITAMIN D3) 1000 UNITS CAPS Take 1 capsule by mouth daily.   . clonazePAM (KLONOPIN) 0.5 MG tablet Take 1 tablet (0.5 mg total) by mouth daily as needed for anxiety.  Tery Sanfilippo Sodium (STOOL SOFTENER) 100 MG capsule Take 100 mg by mouth daily.    . fluticasone (FLONASE) 50 MCG/ACT nasal spray USE 2 SPRAYS INTO EACH NOSTRIL EVERY DAY  . meloxicam (MOBIC) 15 MG tablet Take 1 tablet (15 mg total) by mouth daily.  . montelukast (SINGULAIR) 10 MG tablet Take 1 tablet (10 mg total) by mouth at bedtime.  . Multiple Vitamin (MULTIVITAMIN) tablet Take 1 tablet by mouth daily.    Marland Kitchen nystatin (MYCOSTATIN/NYSTOP) 100000 UNIT/GM POWD Apply BID prn  . omeprazole (PRILOSEC) 20  MG capsule Take 1 capsule (20 mg total) by mouth daily.  Marland Kitchen PARoxetine (PAXIL) 20 MG tablet Take 20 mg by mouth daily.  . pioglitazone (ACTOS) 30 MG tablet Take 0.5 tablets (15 mg total) by mouth daily.  . pregabalin (LYRICA) 25 MG capsule Take 1 capsule (25 mg total) by mouth daily.  . rosuvastatin (CRESTOR) 20 MG tablet Take 1 tablet (20 mg total) by mouth daily.  . traMADol (ULTRAM) 50 MG tablet as needed.  . triamcinolone cream (KENALOG) 0.1 % Apply topically 2 (two) times daily.  . valsartan (DIOVAN) 160 MG tablet Take 1 tablet (160 mg total) by mouth daily.

## 2013-04-07 ENCOUNTER — Encounter: Payer: Self-pay | Admitting: Internal Medicine

## 2013-04-08 MED ORDER — OLMESARTAN MEDOXOMIL 20 MG PO TABS: 20.0000 mg | ORAL_TABLET | Freq: Every day | ORAL | Status: DC

## 2013-04-19 ENCOUNTER — Encounter: Payer: Self-pay | Admitting: Adult Health

## 2013-04-19 ENCOUNTER — Ambulatory Visit (INDEPENDENT_AMBULATORY_CARE_PROVIDER_SITE_OTHER): Payer: Medicare Other | Admitting: Adult Health

## 2013-04-19 VITALS — BP 132/66 | HR 81 | Temp 98.4°F | Ht 65.0 in | Wt 155.8 lb

## 2013-04-19 DIAGNOSIS — J45909 Unspecified asthma, uncomplicated: Secondary | ICD-10-CM

## 2013-04-19 MED ORDER — LEVALBUTEROL HCL 0.63 MG/3ML IN NEBU
0.6300 mg | INHALATION_SOLUTION | Freq: Once | RESPIRATORY_TRACT | Status: AC
Start: 2013-04-19 — End: 2013-04-19
  Administered 2013-04-19: 0.63 mg via RESPIRATORY_TRACT

## 2013-04-19 MED ORDER — AZITHROMYCIN 250 MG PO TABS: ORAL_TABLET | ORAL | Status: DC

## 2013-04-19 NOTE — Progress Notes (Signed)
   Subjective:    Patient ID: [name], female    DOB: [birth year], 80 y.o.   MRN: [id number]  HPI 80 yo WF with known hx of Asthma   04/19/2013 Acute OV  Complains of chest tightness, hoarseness, sweats, mucus in back of throat, prod cough with clear mucus, increased SOB, head congestion w/ clear mucus x3days.  Denies fever, nausea, vomiting, hemoptysis, edema, body aches.  No increased SABA use  No recent abx use.      Review of Systems Constitutional:   No  weight loss, night sweats,  Fevers, chills, fatigue, or  lassitude.  HEENT:   No headaches,  Difficulty swallowing,  Tooth/dental problems, or  Sore throat,                No sneezing, itching, ear ache,  +nasal congestion, post nasal drip,   CV:  No chest pain,  Orthopnea, PND, swelling in lower extremities, anasarca, dizziness, palpitations, syncope.   GI  No heartburn, indigestion, abdominal pain, nausea, vomiting, diarrhea, change in bowel habits, loss of appetite, bloody stools.   Resp:    No chest wall deformity  Skin: no rash or lesions.  GU: no dysuria, change in color of urine, no urgency or frequency.  No flank pain, no hematuria   MS:  No joint pain or swelling.  No decreased range of motion.  No back pain.  Psych:  No change in mood or affect. No depression or anxiety.  No memory loss.         Objective:   Physical Exam  GEN: A/Ox3; pleasant , NAD, well nourished   HEENT:  [place]/AT,  EACs-clear, TMs-wnl, NOSE-clear drainage , THROAT-clear, no lesions,+ postnasal drip -clear    NECK:  Supple w/ fair ROM; no JVD; normal carotid impulses w/o bruits; no thyromegaly or nodules palpated; no lymphadenopathy.  RESP  Clear  P & A; w/o, wheezes/ rales/ or rhonchi.no accessory muscle use, no dullness to percussion  CARD:  RRR, no m/r/g  , no peripheral edema, pulses intact, no cyanosis or clubbing.  GI:   Soft & nt; nml bowel sounds; no organomegaly or masses detected.  Musco: Warm bil, no deformities  or joint swelling noted.   Neuro: alert, no focal deficits noted.    Skin: Warm, no lesions or rashes        Assessment & Plan:

## 2013-04-19 NOTE — Addendum Note (Signed)
Addended by: Parke Poisson E on: 04/19/2013 10:14 AM   Modules accepted: Orders

## 2013-04-19 NOTE — Assessment & Plan Note (Signed)
Mild flare with URI /AR  xopenex neb in office  No active wheezing - hold on steroids for now  abx on hold as this is most likely viral   Plan  Z-Pak to have on hold if symptoms worsen with discolored mucus or do not improve. Mucinex DM twice daily as needed. For cough and congestion. Saline nasal rinses as needed. Followup with Dr. Joya Gaskins next month as planned and as needed

## 2013-04-19 NOTE — Patient Instructions (Signed)
Z-Pak to have on hold if symptoms worsen with discolored mucus or do not improve. Mucinex DM twice daily as needed. For cough and congestion. Saline nasal rinses as needed. Followup with Dr. Joya Gaskins next month as planned and as needed

## 2013-04-26 ENCOUNTER — Encounter: Payer: Self-pay | Admitting: Pulmonary Disease

## 2013-04-26 ENCOUNTER — Ambulatory Visit (INDEPENDENT_AMBULATORY_CARE_PROVIDER_SITE_OTHER): Payer: Medicare Other | Admitting: Pulmonary Disease

## 2013-04-26 VITALS — BP 124/72 | HR 81 | Temp 97.5°F | Ht 65.0 in | Wt 156.6 lb

## 2013-04-26 DIAGNOSIS — J45909 Unspecified asthma, uncomplicated: Secondary | ICD-10-CM

## 2013-04-26 DIAGNOSIS — J45901 Unspecified asthma with (acute) exacerbation: Secondary | ICD-10-CM | POA: Diagnosis not present

## 2013-04-26 MED ORDER — PREDNISONE 10 MG PO TABS: ORAL_TABLET | ORAL | Status: DC

## 2013-04-26 NOTE — Progress Notes (Signed)
Subjective:    Patient ID: [name], female    DOB: [birth year], 80 y.o.   MRN: [id number]  HPI  80 y/o female patient of Dr. Joya Gaskins with asthma and frequent exacerbations.  04/26/2013 ROV >> Angelyn Punt is here to see me because she has ben having ongoing cough, wheezing and dyspnea.  She is having her house remodeled with a lot of painting which has been making her lungs worse.  [place] but wan't to get better before that.  She is producing clear nasal mucus and thick mucus which is tan in color.  The color has improved in the last few days on the zpack which she completed on Friday.  She has been having more dyspna, chest tighenss and inability to walk up a in incline wihtout difficulty.  No fevers.  She feels that this spell is different than normal because she not been coughing much at night.  Past Medical History  Diagnosis Date  . ARTHRITIS     s/p bilateral THR  . MIGRAINE HEADACHE   . Interstitial cystitis   . ALLERGIC RHINITIS   . ANEMIA, IRON DEFICIENCY   . ANXIETY   . ASTHMA, EXTRINSIC   . HYPERTENSION   . HYPERLIPIDEMIA   . G E R D   . DIABETES MELLITUS, TYPE II   . FIBROMYALGIA     fibromyalgia  . Chronic constipation      Review of Systems     Objective:   Physical Exam Filed Vitals:   04/26/13 1432  BP: 124/72  Pulse: 81  Temp: 97.5 F (36.4 C)  TempSrc: Oral  Height: 5\' 5"  (1.651 m)  Weight: 156 lb 9.6 oz (71.033 kg)  SpO2: 99%  RA  Gen: no acute distress HEENT: NCAT, EOMi PULM: Wheezing in bases, good air movement CV: RRR, no mgr AB: BS+, soft, nontender Ext: warm, no edema      Assessment & Plan:   Moderate persistent asthma with atopic features She is having another flare of her asthmatic bronchitis  I agree with her that she should have an allergy test at some point given her recent onslaught of flares  Plan: -pred taper again -sputum culture -Abx by week's end if no improvement -consider allergy  eval -f/u with Dr Joya Gaskins as previously scheduled   Updated Medication List Outpatient Encounter Prescriptions as of 04/26/2013  Medication Sig  . acetaminophen (TYLENOL) 500 MG tablet Take 1,000 mg by mouth every 6 (six) hours as needed. For pain  . albuterol (PROAIR HFA) 108 (90 BASE) MCG/ACT inhaler Inhale 2 puffs into the lungs every 6 (six) hours as needed.  . budesonide-formoterol (SYMBICORT) 160-4.5 MCG/ACT inhaler Inhale 2 puffs into the lungs 2 (two) times daily.  . Calcium-Vitamin D (SUPER CALCIUM/D) 600-125 MG-UNIT TABS Take 1 tablet by mouth daily.   . cetirizine (ZYRTEC) 10 MG tablet Take 10 mg by mouth daily.   . Cholecalciferol (VITAMIN D3) 1000 UNITS CAPS Take 1 capsule by mouth daily.   . clonazePAM (KLONOPIN) 0.5 MG tablet Take 1 tablet (0.5 mg total) by mouth daily as needed for anxiety.  Mariane Baumgarten Sodium (STOOL SOFTENER) 100 MG capsule Take 100 mg by mouth daily.    . fluticasone (FLONASE) 50 MCG/ACT nasal spray USE 2 SPRAYS INTO EACH NOSTRIL EVERY DAY  . meloxicam (MOBIC) 15 MG tablet Take 1 tablet (15 mg total) by mouth daily.  . montelukast (SINGULAIR) 10 MG tablet Take 1 tablet (10  mg total) by mouth at bedtime.  . Multiple Vitamin (MULTIVITAMIN) tablet Take 1 tablet by mouth daily.    Marland Kitchen nystatin (MYCOSTATIN/NYSTOP) 100000 UNIT/GM POWD Apply BID prn  . olmesartan (BENICAR) 20 MG tablet Take 1 tablet (20 mg total) by mouth daily.  Marland Kitchen omeprazole (PRILOSEC) 20 MG capsule Take 1 capsule (20 mg total) by mouth daily.  Marland Kitchen PARoxetine (PAXIL) 20 MG tablet Take 20 mg by mouth daily.  . pioglitazone (ACTOS) 30 MG tablet Take 0.5 tablets (15 mg total) by mouth daily.  . pregabalin (LYRICA) 25 MG capsule Take 1 capsule (25 mg total) by mouth daily.  . rosuvastatin (CRESTOR) 20 MG tablet Take 1 tablet (20 mg total) by mouth daily.  . traMADol (ULTRAM) 50 MG tablet as needed.  . triamcinolone cream (KENALOG) 0.1 % Apply 1 application topically 2 (two) times daily as needed.  .  predniSONE (DELTASONE) 10 MG tablet Take 30mg  daily for 3 days, then 10mg  daily for 3 days, then off  . [DISCONTINUED] azithromycin (ZITHROMAX) 250 MG tablet Take as directed

## 2013-04-26 NOTE — Assessment & Plan Note (Signed)
She is having another flare of her asthmatic bronchitis  I agree with her that she should have an allergy test at some point given her recent onslaught of flares  Plan: -pred taper again -sputum culture -Abx by week's end if no improvement -consider allergy eval -f/u with Dr Joya Gaskins as previously scheduled

## 2013-04-26 NOTE — Patient Instructions (Signed)
Take the prednisone as written We will refer you to an allergist Call us if you are not better by the end of the week We wil see you back as previously scheduled

## 2013-05-13 ENCOUNTER — Ambulatory Visit: Payer: Medicare Other | Admitting: Critical Care Medicine

## 2013-05-22 ENCOUNTER — Encounter: Payer: Self-pay | Admitting: Internal Medicine

## 2013-05-24 ENCOUNTER — Other Ambulatory Visit: Payer: Self-pay | Admitting: *Deleted

## 2013-05-24 NOTE — Telephone Encounter (Signed)
Pt sent email requesting refill on her lyrica 90 day supply sent to [facility]. MD out of office pls advise on refill...Johny Chess

## 2013-05-24 NOTE — Telephone Encounter (Signed)
Called to verify [facility] request. No answer LMOM RTC...Johny Chess

## 2013-05-25 ENCOUNTER — Ambulatory Visit: Payer: Medicare Other | Admitting: Internal Medicine

## 2013-05-25 NOTE — Telephone Encounter (Signed)
Called pt back to verify mg on Lyrica. Pt states she is taking 50 mg. She saw md back in 11/06/12 and was rx the 50 mg. Pt states she takes 1 at bedtime. Due to insurance she has to have 90 day supply...[name]

## 2013-05-26 MED ORDER — LYRICA 25 MG PO CAPS: 25.0000 mg | ORAL_CAPSULE | Freq: Every day | ORAL | Status: DC

## 2013-05-26 NOTE — Telephone Encounter (Signed)
Done hardcopy to robin  

## 2013-05-26 NOTE — Telephone Encounter (Signed)
Dr. Jenny Reichmann will you please address this msg for Dr. Asa Lente. Pt has called again requesting status of refill on her lyrica. Pls advise...Johny Chess

## 2013-05-26 NOTE — Telephone Encounter (Signed)
Called pt no answer LMOM Dr. Jenny Reichmann has approve the lyrica 25 mg instead of the 50 mg. Whn she was seen for her cpx 11/19/12 md had decrease lyrica to 25 mg. If she is wanting 50 mg she will need to f/u with Dr. Asa Lente...Johny Chess

## 2013-05-27 ENCOUNTER — Institutional Professional Consult (permissible substitution): Payer: Medicare Other | Admitting: Internal Medicine

## 2013-06-01 DIAGNOSIS — M545 Low back pain, unspecified: Secondary | ICD-10-CM | POA: Diagnosis not present

## 2013-06-04 ENCOUNTER — Other Ambulatory Visit (INDEPENDENT_AMBULATORY_CARE_PROVIDER_SITE_OTHER): Payer: Medicare Other

## 2013-06-04 ENCOUNTER — Ambulatory Visit (INDEPENDENT_AMBULATORY_CARE_PROVIDER_SITE_OTHER): Payer: Medicare Other | Admitting: Internal Medicine

## 2013-06-04 ENCOUNTER — Encounter: Payer: Self-pay | Admitting: Internal Medicine

## 2013-06-04 VITALS — BP 132/80 | HR 76 | Temp 97.0°F | Wt 157.0 lb

## 2013-06-04 DIAGNOSIS — E119 Type 2 diabetes mellitus without complications: Secondary | ICD-10-CM

## 2013-06-04 DIAGNOSIS — F411 Generalized anxiety disorder: Secondary | ICD-10-CM

## 2013-06-04 DIAGNOSIS — E785 Hyperlipidemia, unspecified: Secondary | ICD-10-CM

## 2013-06-04 LAB — BASIC METABOLIC PANEL
BUN: 16 mg/dL (ref 6–23)
CHLORIDE: 102 meq/L (ref 96–112)
CO2: 29 mEq/L (ref 19–32)
Calcium: 9.2 mg/dL (ref 8.4–10.5)
Creatinine, Ser: 0.7 mg/dL (ref 0.4–1.2)
GFR: 81.93 mL/min (ref >60.00)
Glucose, Bld: 95 mg/dL (ref 70–99)
Potassium: 4.2 mEq/L (ref 3.5–5.1)
SODIUM: 142 meq/L (ref 135–145)

## 2013-06-04 LAB — LIPID PANEL
CHOLESTEROL: 197 mg/dL (ref 0–200)
HDL: 69.3 mg/dL (ref >39.00)
LDL Cholesterol: 98 mg/dL (ref 0–99)
Total CHOL/HDL Ratio: 3
Triglycerides: 151 mg/dL — ABNORMAL HIGH (ref 0.0–149.0)
VLDL: 30.2 mg/dL (ref 0.0–40.0)

## 2013-06-04 LAB — HEMOGLOBIN A1C: HEMOGLOBIN A1C: 6.2 % (ref 4.6–6.5)

## 2013-06-04 MED ORDER — MELOXICAM 15 MG PO TABS: 15.0000 mg | ORAL_TABLET | Freq: Every day | ORAL | Status: DC

## 2013-06-04 MED ORDER — OLMESARTAN MEDOXOMIL 20 MG PO TABS: 20.0000 mg | ORAL_TABLET | Freq: Every day | ORAL | Status: DC

## 2013-06-04 MED ORDER — DULOXETINE HCL 20 MG PO CPEP: 20.0000 mg | ORAL_CAPSULE | Freq: Every day | ORAL | Status: DC

## 2013-06-04 MED ORDER — FLUTICASONE PROPIONATE 50 MCG/ACT NA SUSP: NASAL | Status: AC

## 2013-06-04 MED ORDER — ALBUTEROL SULFATE HFA 108 (90 BASE) MCG/ACT IN AERS: 2.0000 | INHALATION_SPRAY | Freq: Four times a day (QID) | RESPIRATORY_TRACT | Status: DC | PRN

## 2013-06-04 MED ORDER — CLONAZEPAM 0.5 MG PO TABS: 0.2500 mg | ORAL_TABLET | ORAL | Status: DC

## 2013-06-04 MED ORDER — OMEPRAZOLE 20 MG PO CPDR: 20.0000 mg | DELAYED_RELEASE_CAPSULE | Freq: Every day | ORAL | Status: DC

## 2013-06-04 MED ORDER — BUDESONIDE-FORMOTEROL FUMARATE 160-4.5 MCG/ACT IN AERO: 2.0000 | INHALATION_SPRAY | Freq: Two times a day (BID) | RESPIRATORY_TRACT | Status: DC

## 2013-06-04 NOTE — Assessment & Plan Note (Signed)
Resumed simva 05/2010 - caused leg pains Changed to crestor 20 every other day 08/2010 -  minimally compliant due to side effects, but resumed regular effort at compliance with same fall 2013 Reviewed last lipids, check annually  No new rx change recommended today 

## 2013-06-04 NOTE — Assessment & Plan Note (Signed)
inc symptoms 06/2010 precipitated by illness of spouse and family stressors-  increased dose paxil cr to bid spring 2012  Then changed SSRI to short acting form, slightly lower dose 08/2010 -  Wean off BZ as tolerated, change to SNRI now we reviewed potential risk/benefit and possible side effects - pt understands and agrees to same

## 2013-06-04 NOTE — Progress Notes (Signed)
Subjective:    Patient ID: [name], female    DOB: [birth year], 80 y.o.   MRN: [id number]  HPI  Here for follow up - reviewed chronic medical issues and interval events:  anxiety and depression; DM2; dyslipidemia; fibromyalgia -  Past Medical History  Diagnosis Date  . ARTHRITIS     s/p bilateral THR  . MIGRAINE HEADACHE   . Interstitial cystitis   . ALLERGIC RHINITIS   . ANEMIA, IRON DEFICIENCY   . ANXIETY   . ASTHMA, EXTRINSIC   . HYPERTENSION   . HYPERLIPIDEMIA   . G E R D   . DIABETES MELLITUS, TYPE II   . FIBROMYALGIA     fibromyalgia  . Chronic constipation     Review of Systems  Constitutional: Positive for fatigue. Negative for fever and unexpected weight change.  Respiratory: Negative for cough and shortness of breath.   Cardiovascular: Negative for chest pain and palpitations.  Neurological: Negative for weakness and headaches.       Objective:   Physical Exam BP 132/80  Pulse 76  Temp(Src) 97 F (36.1 C) (Oral)  Wt 157 lb (71.215 kg)  SpO2 98% Wt Readings from Last 3 Encounters:  06/04/13 157 lb (71.215 kg)  04/26/13 156 lb 9.6 oz (71.033 kg)  04/19/13 155 lb 12.8 oz (70.67 kg)   Constitutional: She appears well-developed and well-nourished. No distress. Spouse at side Neck: Normal range of motion. Neck supple. No JVD or LAD present. No thyromegaly present.  Cardiovascular: Normal rate, regular rhythm and normal heart sounds.  No murmur heard. No BLE edema. Pulmonary/Chest: Effort normal and breath sounds normal. No respiratory distress. She has no wheezes.  Psychiatric: She has a normal mood and affect. Her behavior is normal. Judgment and thought content normal.    Lab Results  Component Value Date   WBC 7.7 11/19/2012   HGB 13.4 11/19/2012   HCT 39.5 11/19/2012   PLT 174.0 11/19/2012   CHOL 184 05/21/2012   TRIG 117.0 05/21/2012   HDL 60.30 05/21/2012   LDLDIRECT 195.1 04/12/2011   ALT 22 11/19/2012   AST 19 11/19/2012   NA 140  11/19/2012   K 4.8 11/19/2012   CL 105 11/19/2012   CREATININE 0.8 11/19/2012   BUN 15 11/19/2012   CO2 30 11/19/2012   TSH 1.58 11/19/2012   INR 0.9 04/12/2011   HGBA1C 6.2 11/19/2012   MICROALBUR 0.4 11/19/2012        Assessment & Plan:   Problem List Items Addressed This Visit   ANXIETY - Primary     inc symptoms 06/2010 precipitated by illness of spouse and family stressors-  increased dose paxil cr to bid spring 2012  Then changed SSRI to short acting form, slightly lower dose 08/2010 -  Wean off BZ as tolerated, change to SNRI now we reviewed potential risk/benefit and possible side effects - pt understands and agrees to same     Relevant Medications      DULoxetine (CYMBALTA) DR capsule   DIABETES MELLITUS, TYPE II      Intolerant of metformin due to diarrhea Continue actos - check a1c and titrate if needed On ARB and statin, regular eye exams The current medical regimen is effective;  continue present plan and medications.  Lab Results  Component Value Date   HGBA1C 6.2 11/19/2012      Relevant Medications      olmesartan (BENICAR) tablet   Other Relevant Orders  Hemoglobin A1c      Lipid panel      Basic metabolic panel   HYPERLIPIDEMIA     Resumed simva 05/2010 - caused leg pains Changed to crestor 20 every other day 08/2010 -  minimally compliant due to side effects, but resumed regular effort at compliance with same fall 2013 Reviewed last lipids, check annually  No new rx change recommended today

## 2013-06-04 NOTE — Assessment & Plan Note (Signed)
Intolerant of metformin due to diarrhea Continue actos - check a1c and titrate if needed On ARB and statin, regular eye exams The current medical regimen is effective;  continue present plan and medications.  Lab Results  Component Value Date   HGBA1C 6.2 11/19/2012

## 2013-06-04 NOTE — Addendum Note (Signed)
Addended by: Earnstine Regal on: 06/04/2013 02:52 PM   Modules accepted: Orders

## 2013-06-04 NOTE — Patient Instructions (Addendum)
It was good to see you today.  We have reviewed your prior records including labs and tests today  Test(s) ordered today. Your results will be released to [place] (or called to you) after review, usually within 72hours after test completion. If any changes need to be made, you will be notified at that same time.  Medications reviewed and updated Wean off clonazepam using half tablet every other day for 2 weeks, then half tablet every third day for 2 weeks, then stop. Okay to use as needed if needed for anxiety or panic attack symptoms Generic Paxil. Begin generic Cymbalta 20 mg daily for anxiety depression and chronic pain symptoms No other changes recommended at this time.  Your prescription(s) have been submitted to your pharmacy. Please take as directed and contact our office if you believe you are having problem(s) with the medication(s).  Refill on medication(s) as discussed today.  Please schedule followup in 6 months, call sooner if problems.  Diabetes and Standards of Medical Care  Diabetes is complicated. You may find that your diabetes team includes a dietitian, nurse, diabetes educator, eye doctor, and more. To help everyone know what is going on and to help you get the care you deserve, the following schedule of care was developed to help keep you on track. Below are the tests, exams, vaccines, medicines, education, and plans you will need. HbA1c test This test shows how well you have controlled your glucose over the past 2 3 months. It is used to see if your diabetes management plan needs to be adjusted.   It is performed at least 2 times a year if you are meeting treatment goals.  It is performed 4 times a year if therapy has changed or if you are not meeting treatment goals. Blood pressure test  This test is performed at every routine medical visit. The goal is less than 140/90 mmHg for most people, but 130/80 mmHg in some cases. Ask your health care provider about your  goal. Dental exam  Follow up with the dentist regularly. Eye exam  If you are diagnosed with type 1 diabetes as a child, get an exam upon reaching the age of 36 years or older and have had diabetes for 3 5 years. Yearly eye exams are recommended after that initial eye exam.  If you are diagnosed with type 1 diabetes as an adult, get an exam within 5 years of diagnosis and then yearly.  If you are diagnosed with type 2 diabetes, get an exam as soon as possible after the diagnosis and then yearly. Foot care exam  Visual foot exams are performed at every routine medical visit. The exams check for cuts, injuries, or other problems with the feet.  A comprehensive foot exam should be done yearly. This includes visual inspection as well as assessing foot pulses and testing for loss of sensation.  Check your feet nightly for cuts, injuries, or other problems with your feet. Tell your health care provider if anything is not healing. Kidney function test (urine microalbumin)  This test is performed once a year.  Type 1 diabetes: The first test is performed 5 years after diagnosis.  Type 2 diabetes: The first test is performed at the time of diagnosis.  A serum creatinine and estimated glomerular filtration rate (eGFR) test is done once a year to assess the level of chronic kidney disease (CKD), if present. Lipid profile (cholesterol, HDL, LDL, triglycerides)  Performed every 5 years for most people.  The goal  for LDL is less than 100 mg/dL. If you are at high risk, the goal is less than 70 mg/dL.  The goal for HDL is 40 mg/dL 50 mg/dL for men and 50 mg/dL 60 mg/dL for women. An HDL cholesterol of 60 mg/dL or higher gives some protection against heart disease.  The goal for triglycerides is less than 150 mg/dL. Influenza vaccine, pneumococcal vaccine, and hepatitis B vaccine  The influenza vaccine is recommended yearly.  The pneumococcal vaccine is generally given once in a lifetime.  However, there are some instances when another vaccination is recommended. Check with your health care provider.  The hepatitis B vaccine is also recommended for adults with diabetes. Diabetes self-management education  Education is recommended at diagnosis and ongoing as needed. Treatment plan  Your treatment plan is reviewed at every medical visit. Document Released: 01/06/2009 Document Revised: 11/11/2012 Document Reviewed: 08/11/2012 [facility] Patient Information 2014 [place].

## 2013-06-04 NOTE — Progress Notes (Signed)
Pre visit review using our clinic review tool, if applicable. No additional management support is needed unless otherwise documented below in the visit note. 

## 2013-06-10 ENCOUNTER — Other Ambulatory Visit: Payer: Self-pay | Admitting: *Deleted

## 2013-07-10 ENCOUNTER — Other Ambulatory Visit: Payer: Self-pay | Admitting: Critical Care Medicine

## 2013-07-13 ENCOUNTER — Encounter: Payer: Self-pay | Admitting: Internal Medicine

## 2013-07-13 ENCOUNTER — Other Ambulatory Visit (INDEPENDENT_AMBULATORY_CARE_PROVIDER_SITE_OTHER): Payer: Medicare Other

## 2013-07-13 ENCOUNTER — Ambulatory Visit (INDEPENDENT_AMBULATORY_CARE_PROVIDER_SITE_OTHER): Payer: Medicare Other | Admitting: Internal Medicine

## 2013-07-13 VITALS — BP 134/90 | HR 91 | Temp 97.2°F | Wt 152.1 lb

## 2013-07-13 DIAGNOSIS — F411 Generalized anxiety disorder: Secondary | ICD-10-CM | POA: Diagnosis not present

## 2013-07-13 DIAGNOSIS — R413 Other amnesia: Secondary | ICD-10-CM | POA: Diagnosis not present

## 2013-07-13 DIAGNOSIS — IMO0001 Reserved for inherently not codable concepts without codable children: Secondary | ICD-10-CM | POA: Diagnosis not present

## 2013-07-13 LAB — TSH: TSH: 2.09 u[IU]/mL (ref 0.35–5.50)

## 2013-07-13 MED ORDER — CLONAZEPAM 0.5 MG PO TABS: 0.2500 mg | ORAL_TABLET | ORAL | Status: DC

## 2013-07-13 MED ORDER — DULOXETINE HCL 20 MG PO CPEP: 40.0000 mg | ORAL_CAPSULE | Freq: Every day | ORAL | Status: DC

## 2013-07-13 NOTE — Patient Instructions (Signed)
It was good to see you today.  We have reviewed your prior records including labs and tests today  Test(s) ordered today. Your results will be released to [place] (or called to you) after review, usually within 72hours after test completion. If any changes need to be made, you will be notified at that same time.  Medications reviewed and updated Increase Cymbalta to 40 mg (2- 20 mg tablet) daily Resume clonazepam half tablet each morning  Please send me a message regarding dosing of Cymbalta prior to need for refills  Please schedule followup in 3-4 months, call sooner if problems.

## 2013-07-13 NOTE — Progress Notes (Signed)
Subjective:    Patient ID: [name], female    DOB: October 10, 1933, 80 y.o.   MRN: [id number]  HPI  Patient is here for follow up  Reviewed chronic medical issues and interval medical events  Past Medical History  Diagnosis Date  . ARTHRITIS     s/p bilateral THR  . MIGRAINE HEADACHE   . Interstitial cystitis   . ALLERGIC RHINITIS   . ANEMIA, IRON DEFICIENCY   . ANXIETY   . ASTHMA, EXTRINSIC   . HYPERTENSION   . HYPERLIPIDEMIA   . G E R D   . DIABETES MELLITUS, TYPE II   . FIBROMYALGIA     fibromyalgia  . Chronic constipation     Review of Systems  Constitutional: Positive for fatigue and unexpected weight change (down approx 5# w/o effort in 2 months). Negative for fever.  Musculoskeletal: Positive for arthralgias and myalgias. Negative for joint swelling.  Skin: Negative for pallor and wound.  Neurological: Negative for weakness and headaches.       ?memory loss  Psychiatric/Behavioral: Positive for sleep disturbance and decreased concentration. Negative for suicidal ideas and self-injury. The patient is nervous/anxious.        Objective:   Physical Exam  BP 134/90  Pulse 91  Temp(Src) 97.2 F (36.2 C) (Oral)  Wt 152 lb 1.9 oz (69.001 kg)  SpO2 96% Wt Readings from Last 3 Encounters:  07/13/13 152 lb 1.9 oz (69.001 kg)  06/04/13 157 lb (71.215 kg)  04/26/13 156 lb 9.6 oz (71.033 kg)   Constitutional: She appears well-developed and well-nourished. No distress.  Neck: Normal range of motion. Neck supple. No JVD present. No thyromegaly present.  Cardiovascular: Normal rate, regular rhythm and normal heart sounds.  No murmur heard. No BLE edema. Pulmonary/Chest: Effort normal and breath sounds normal. No respiratory distress. She has no wheezes.  Psychiatric: She has a mildly anxious mood and affect. Her behavior is normal. Judgment and thought content normal.   Lab Results  Component Value Date   WBC 7.7 11/19/2012   HGB 13.4 11/19/2012   HCT 39.5  11/19/2012   PLT 174.0 11/19/2012   GLUCOSE 95 06/04/2013   CHOL 197 06/04/2013   TRIG 151.0* 06/04/2013   HDL 69.30 06/04/2013   LDLDIRECT 195.1 04/12/2011   LDLCALC 98 06/04/2013   ALT 22 11/19/2012   AST 19 11/19/2012   NA 142 06/04/2013   K 4.2 06/04/2013   CL 102 06/04/2013   CREATININE 0.7 06/04/2013   BUN 16 06/04/2013   CO2 29 06/04/2013   TSH 1.58 11/19/2012   INR 0.9 04/12/2011   HGBA1C 6.2 06/04/2013   MICROALBUR 0.4 11/19/2012    Dg Shoulder Left  04/03/2012   *RADIOLOGY REPORT*  Clinical Data: Left shoulder pain  LEFT SHOULDER - 2+ VIEW  Comparison: None.  Findings: There is anterior shoulder dislocation with the humeral head anterior and  medial to the glenoid fossa.  No evidence of fracture of the humeral head or scapula.  IMPRESSION: Anterior shoulder dislocation.   Original Report Authenticated By: Suzy Bouchard, M.D.   Dg Shoulder Left Port  04/03/2012   *RADIOLOGY REPORT*  Clinical Data: Post reduction  PORTABLE LEFT SHOULDER - 2+ VIEW  Comparison: Plain film 04/03/2012  Findings: Interval reduction of the anterior shoulder dislocation. The glenohumeral joint is intact.  No evidence of fracture.  IMPRESSION: Post reduction of anterior shoulder dislocation.   Original Report Authenticated By: Suzy Bouchard, M.D.       Assessment &  Plan:   Problem List Items Addressed This Visit   ANXIETY     chronic symptoms  Flare 06/2010 precipitated by illness of spouse and family stressors- increased dose paxil cr to bid spring 2012  Then changed SSRI to short acting form, slightly lower dose 08/2010 -  Early 2015 attempted wean from chronic low dose BZ (clonazepam) - increased symptoms so will resume now Changed SSRI to SNRI 05/2013 - increase dose cymbalta now we reviewed potential risk/benefit and possible side effects - pt understands and agrees to same     Relevant Medications      DULoxetine (CYMBALTA) DR capsule   Other Relevant Orders      TSH (Completed)   FIBROMYALGIA -  Primary     Dx 1998 - exac by emotional stressors and weather Prior treatment with amitriptyline poorly tolerated due to side effects  added low dose Lyrica 10/2012 to ongoing SSRI/clonazeam - Changed paxil to cymbalta 04/2013 for overlapping mood disorder - see next Increase dose cymbalta now    Relevant Orders      TSH (Completed)   Memory loss     Appears symptoms overlap with flare of anxiety and FM - ?psudodementia Reviewed FH dementia - dad, sister Continue med stabilization of other mood disorder - see above Will further eval if worsening cognitive fx, neuro change or if memory unimproved when anxiety/FM stable Pt understands and agrees - will call if change/worse    Relevant Orders      TSH (Completed)

## 2013-07-13 NOTE — Assessment & Plan Note (Signed)
Dx 1998 - exac by emotional stressors and weather Prior treatment with amitriptyline poorly tolerated due to side effects  added low dose Lyrica 10/2012 to ongoing SSRI/clonazeam - Changed paxil to cymbalta 04/2013 for overlapping mood disorder - see next Increase dose cymbalta now

## 2013-07-13 NOTE — Assessment & Plan Note (Signed)
Appears symptoms overlap with flare of anxiety and FM - ?psudodementia Reviewed FH dementia - dad, sister Continue med stabilization of other mood disorder - see above Will further eval if worsening cognitive fx, neuro change or if memory unimproved when anxiety/FM stable Pt understands and agrees - will call if change/worse

## 2013-07-13 NOTE — Progress Notes (Signed)
Pre visit review using our clinic review tool, if applicable. No additional management support is needed unless otherwise documented below in the visit note. 

## 2013-07-13 NOTE — Assessment & Plan Note (Signed)
chronic symptoms  Flare 06/2010 precipitated by illness of spouse and family stressors- increased dose paxil cr to bid spring 2012  Then changed SSRI to short acting form, slightly lower dose 08/2010 -  Early 2015 attempted wean from chronic low dose BZ (clonazepam) - increased symptoms so will resume now Changed SSRI to SNRI 05/2013 - increase dose cymbalta now we reviewed potential risk/benefit and possible side effects - pt understands and agrees to same

## 2013-07-23 ENCOUNTER — Encounter: Payer: Self-pay | Admitting: Internal Medicine

## 2013-07-27 MED ORDER — DULOXETINE HCL 30 MG PO CPEP: 30.0000 mg | ORAL_CAPSULE | Freq: Every day | ORAL | Status: DC

## 2013-09-08 DIAGNOSIS — M961 Postlaminectomy syndrome, not elsewhere classified: Secondary | ICD-10-CM | POA: Diagnosis not present

## 2013-09-23 DIAGNOSIS — E119 Type 2 diabetes mellitus without complications: Secondary | ICD-10-CM | POA: Diagnosis not present

## 2013-09-23 DIAGNOSIS — Z961 Presence of intraocular lens: Secondary | ICD-10-CM | POA: Diagnosis not present

## 2013-09-23 DIAGNOSIS — H40019 Open angle with borderline findings, low risk, unspecified eye: Secondary | ICD-10-CM | POA: Diagnosis not present

## 2013-09-23 DIAGNOSIS — H04129 Dry eye syndrome of unspecified lacrimal gland: Secondary | ICD-10-CM | POA: Diagnosis not present

## 2013-11-01 ENCOUNTER — Encounter: Payer: Self-pay | Admitting: Pulmonary Disease

## 2013-11-02 NOTE — Telephone Encounter (Signed)
Please advise on patient email Dr Lake Bells. Thanks.

## 2013-11-03 ENCOUNTER — Telehealth: Payer: Self-pay | Admitting: Cardiovascular Disease

## 2013-11-04 NOTE — Telephone Encounter (Signed)
Closed encounter °

## 2013-12-06 ENCOUNTER — Ambulatory Visit: Payer: Medicare Other | Admitting: Internal Medicine

## 2013-12-07 DIAGNOSIS — M545 Low back pain, unspecified: Secondary | ICD-10-CM | POA: Diagnosis not present

## 2013-12-13 ENCOUNTER — Ambulatory Visit (INDEPENDENT_AMBULATORY_CARE_PROVIDER_SITE_OTHER): Payer: Medicare Other | Admitting: Internal Medicine

## 2013-12-13 ENCOUNTER — Encounter: Payer: Self-pay | Admitting: Internal Medicine

## 2013-12-13 VITALS — BP 130/78 | HR 77 | Temp 97.9°F | Ht 65.0 in | Wt 147.5 lb

## 2013-12-13 DIAGNOSIS — F411 Generalized anxiety disorder: Secondary | ICD-10-CM

## 2013-12-13 DIAGNOSIS — E785 Hyperlipidemia, unspecified: Secondary | ICD-10-CM | POA: Diagnosis not present

## 2013-12-13 DIAGNOSIS — G4700 Insomnia, unspecified: Secondary | ICD-10-CM

## 2013-12-13 DIAGNOSIS — Z23 Encounter for immunization: Secondary | ICD-10-CM | POA: Diagnosis not present

## 2013-12-13 MED ORDER — ROSUVASTATIN CALCIUM 20 MG PO TABS: 20.0000 mg | ORAL_TABLET | Freq: Every day | ORAL | Status: DC

## 2013-12-13 MED ORDER — METAXALONE 800 MG PO TABS: 800.0000 mg | ORAL_TABLET | Freq: Three times a day (TID) | ORAL | Status: DC | PRN

## 2013-12-13 MED ORDER — ZOLPIDEM TARTRATE 5 MG PO TABS: 5.0000 mg | ORAL_TABLET | Freq: Every evening | ORAL | Status: DC | PRN

## 2013-12-13 MED ORDER — DULOXETINE HCL 60 MG PO CPEP: 60.0000 mg | ORAL_CAPSULE | Freq: Every day | ORAL | Status: DC

## 2013-12-13 NOTE — Patient Instructions (Signed)
It was good to see you today.  We have reviewed your prior records including labs and tests today  Your annual flu shot was given and/or updated today.  Medications reviewed and updated, no changes recommended at this time. Refill on medication(s) as discussed today.  Please schedule followup in 6 months for annual exam and labs, call sooner if problems.

## 2013-12-13 NOTE — Assessment & Plan Note (Signed)
chronic symptoms  Flare 06/2010 precipitated by illness of spouse and family stressors- increased dose paxil cr to bid spring 2012  Then changed SSRI to short acting form, slightly lower dose 08/2010 -  Early 2015 attempted wean from chronic low dose BZ (clonazepam) - increased symptoms so will resumed 06/2013 Changed SSRI to SNRI 05/2013 - increase dose cymbalta spring-summer 2015 Doing well on cymbalta 60mg  qd - off BZ again Support offered - The current medical regimen is effective;  continue present plan and medications.

## 2013-12-13 NOTE — Progress Notes (Signed)
Subjective:    Patient ID: [name], female    DOB: Sep 27, 1933, 80 y.o.   MRN: [id number]  HPI  Patient is here for follow up  Reviewed chronic medical issues and interval medical events  Past Medical History  Diagnosis Date  . ARTHRITIS     s/p bilateral THR  . MIGRAINE HEADACHE   . Interstitial cystitis   . ALLERGIC RHINITIS   . ANEMIA, IRON DEFICIENCY   . ANXIETY   . ASTHMA, EXTRINSIC   . HYPERTENSION   . HYPERLIPIDEMIA   . G E R D   . DIABETES MELLITUS, TYPE II   . FIBROMYALGIA     fibromyalgia  . Chronic constipation     Review of Systems  Constitutional: Positive for fatigue. Negative for fever and unexpected weight change.  Respiratory: Negative for cough and shortness of breath.   Cardiovascular: Negative for chest pain and leg swelling.  Psychiatric/Behavioral: Positive for sleep disturbance. Negative for suicidal ideas, behavioral problems, confusion, self-injury, dysphoric mood and decreased concentration. The patient is not nervous/anxious.        Objective:   Physical Exam  BP 130/78  Pulse 77  Temp(Src) 97.9 F (36.6 C) (Oral)  Ht 5\' 5"  (1.651 m)  Wt 147 lb 8 oz (66.906 kg)  BMI 24.55 kg/m2  SpO2 96% Wt Readings from Last 3 Encounters:  12/13/13 147 lb 8 oz (66.906 kg)  07/13/13 152 lb 1.9 oz (69.001 kg)  06/04/13 157 lb (71.215 kg)   Constitutional: She appears well-developed and well-nourished. No distress.  Neck: Normal range of motion. Neck supple. No JVD present. No thyromegaly present.  Cardiovascular: Normal rate, regular rhythm and normal heart sounds.  No murmur heard. No BLE edema. Pulmonary/Chest: Effort normal and breath sounds normal. No respiratory distress. She has no wheezes.  Psychiatric: She has a normal mood and affect. Her behavior is normal. Judgment and thought content normal.   Lab Results  Component Value Date   WBC 7.7 11/19/2012   HGB 13.4 11/19/2012   HCT 39.5 11/19/2012   PLT 174.0 11/19/2012   GLUCOSE 95  06/04/2013   CHOL 197 06/04/2013   TRIG 151.0* 06/04/2013   HDL 69.30 06/04/2013   LDLDIRECT 195.1 04/12/2011   LDLCALC 98 06/04/2013   ALT 22 11/19/2012   AST 19 11/19/2012   NA 142 06/04/2013   K 4.2 06/04/2013   CL 102 06/04/2013   CREATININE 0.7 06/04/2013   BUN 16 06/04/2013   CO2 29 06/04/2013   TSH 2.09 07/13/2013   INR 0.9 04/12/2011   HGBA1C 6.2 06/04/2013   MICROALBUR 0.4 11/19/2012    Dg Shoulder Left  04/03/2012   *RADIOLOGY REPORT*  Clinical Data: Left shoulder pain  LEFT SHOULDER - 2+ VIEW  Comparison: None.  Findings: There is anterior shoulder dislocation with the humeral head anterior and  medial to the glenoid fossa.  No evidence of fracture of the humeral head or scapula.  IMPRESSION: Anterior shoulder dislocation.   Original Report Authenticated By: Suzy Bouchard, M.D.   Dg Shoulder Left Port  04/03/2012   *RADIOLOGY REPORT*  Clinical Data: Post reduction  PORTABLE LEFT SHOULDER - 2+ VIEW  Comparison: Plain film 04/03/2012  Findings: Interval reduction of the anterior shoulder dislocation. The glenohumeral joint is intact.  No evidence of fracture.  IMPRESSION: Post reduction of anterior shoulder dislocation.   Original Report Authenticated By: Suzy Bouchard, M.D.       Assessment & Plan:   Insomnia - off BZ -  ok for low dose Ambien as needed - erx done  Problem List Items Addressed This Visit   ANXIETY     chronic symptoms  Flare 06/2010 precipitated by illness of spouse and family stressors- increased dose paxil cr to bid spring 2012  Then changed SSRI to short acting form, slightly lower dose 08/2010 -  Early 2015 attempted wean from chronic low dose BZ (clonazepam) - increased symptoms so will resumed 06/2013 Changed SSRI to SNRI 05/2013 - increase dose cymbalta spring-summer 2015 Doing well on cymbalta 60mg  qd - off BZ again Support offered - The current medical regimen is effective;  continue present plan and medications.     Relevant Medications      DULoxetine  (CYMBALTA) DR capsule   HYPERLIPIDEMIA     Resumed simva 05/2010 - caused leg pains Changed to crestor 20 every other day 08/2010 -  minimally compliant due to side effects, but resumed regular effort at compliance with same fall 2013 Reviewed last lipids, check annually  No new rx change recommended today    Relevant Medications      rosuvastatin (CRESTOR) tablet    Other Visit Diagnoses   Need for prophylactic vaccination and inoculation against influenza    -  Primary    Relevant Orders       Flu vaccine HIGH DOSE PF (Fluzone Tri High dose)

## 2013-12-13 NOTE — Assessment & Plan Note (Signed)
Resumed simva 05/2010 - caused leg pains Changed to crestor 20 every other day 08/2010 -  minimally compliant due to side effects, but resumed regular effort at compliance with same fall 2013 Reviewed last lipids, check annually  No new rx change recommended today

## 2013-12-27 ENCOUNTER — Encounter: Payer: Self-pay | Admitting: Internal Medicine

## 2014-01-27 ENCOUNTER — Encounter: Payer: Self-pay | Admitting: Cardiovascular Disease

## 2014-01-31 ENCOUNTER — Other Ambulatory Visit: Payer: Self-pay | Admitting: Internal Medicine

## 2014-02-01 ENCOUNTER — Encounter: Payer: Self-pay | Admitting: Cardiovascular Disease

## 2014-02-01 ENCOUNTER — Ambulatory Visit (INDEPENDENT_AMBULATORY_CARE_PROVIDER_SITE_OTHER): Payer: Medicare Other | Admitting: Cardiovascular Disease

## 2014-02-01 VITALS — BP 140/88 | HR 79 | Ht 65.0 in | Wt 148.7 lb

## 2014-02-01 DIAGNOSIS — I10 Essential (primary) hypertension: Secondary | ICD-10-CM | POA: Diagnosis not present

## 2014-02-01 NOTE — Assessment & Plan Note (Signed)
On statin therapy with her most recent lipid profile performed 06/04/13 revealing a total cholesterol 197, LDL of 98 and HDL of 70

## 2014-02-01 NOTE — Assessment & Plan Note (Signed)
Controlled on current medications 

## 2014-02-01 NOTE — Patient Instructions (Signed)
Your physician wants you to follow-up in 1 year with Dr. Berry. You will receive a reminder letter in the mail 2 months in advance. If you do not receive a letter, please call our office to schedule the follow-up appointment.  

## 2014-02-01 NOTE — Progress Notes (Signed)
02/01/2014 [name]   [birth year]  [id number]  Primary Physician Gwendolyn Grant, MD Primary Cardiologist: [name]   HPI:  The patient returns today for followup with her husband, [name]. I saw them both one year ago. She is a 80 year old mildly-overweight married Caucasian female, mother of 3 and grandmother of 1 grandchild, who works as a Acupuncturist. Her risk factors include type 2 diabetes, hypertension, and hyperlipidemia. She does have fibromyalgia. She has had a normal 2D echo and Myoview stress test in the past. She is otherwise asymptomatic. Dr. Asa Lente follows her laboratories.   Her last lipid profile, performed 06/04/13 revealed a total cholesterol 197, LDL of 98 and HDL of 69   Current Outpatient Prescriptions  Medication Sig Dispense Refill  . acetaminophen (TYLENOL) 500 MG tablet Take 1,000 mg by mouth every 6 (six) hours as needed. For pain    . albuterol (PROAIR HFA) 108 (90 BASE) MCG/ACT inhaler Inhale 2 puffs into the lungs every 6 (six) hours as needed. 3 Inhaler 3  . budesonide-formoterol (SYMBICORT) 160-4.5 MCG/ACT inhaler Inhale 2 puffs into the lungs 2 (two) times daily. 3 Inhaler 3  . Calcium-Vitamin D (SUPER CALCIUM/D) 600-125 MG-UNIT TABS Take 1 tablet by mouth daily.     . cetirizine (ZYRTEC) 10 MG tablet Take 10 mg by mouth daily.     . Cholecalciferol (VITAMIN D3) 1000 UNITS CAPS Take 1 capsule by mouth daily.     Mariane Baumgarten Sodium (STOOL SOFTENER) 100 MG capsule Take 100 mg by mouth daily.      . DULoxetine (CYMBALTA) 60 MG capsule Take 1 capsule (60 mg total) by mouth daily. 90 capsule 1  . fluticasone (FLONASE) 50 MCG/ACT nasal spray USE 2 SPRAYS INTO EACH NOSTRIL EVERY DAY 48 g 3  . meloxicam (MOBIC) 15 MG tablet Take 1 tablet (15 mg total) by mouth daily. 90 tablet 3  . metaxalone (SKELAXIN) 800 MG tablet Take 1 tablet (800 mg total) by mouth 3 (three) times daily as needed for muscle  spasms. 90 tablet 0  . montelukast (SINGULAIR) 10 MG tablet Take 1 tablet (10 mg total) by mouth at bedtime. 90 tablet 2  . Multiple Vitamin (MULTIVITAMIN) tablet Take 1 tablet by mouth daily.      Marland Kitchen nystatin (MYCOSTATIN/NYSTOP) 100000 UNIT/GM POWD Apply BID prn 60 g 0  . olmesartan (BENICAR) 20 MG tablet Take 1 tablet (20 mg total) by mouth daily. 90 tablet 3  . omeprazole (PRILOSEC) 20 MG capsule Take 1 capsule (20 mg total) by mouth daily. 90 capsule 3  . pioglitazone (ACTOS) 30 MG tablet Take one-half tablet by  mouth daily 45 tablet 3  . rosuvastatin (CRESTOR) 20 MG tablet Take 1 tablet (20 mg total) by mouth daily. 90 tablet 3  . traMADol (ULTRAM) 50 MG tablet Take 50 mg by mouth every 6 (six) hours as needed.     . triamcinolone cream (KENALOG) 0.1 % Apply 1 application topically 2 (two) times daily as needed.    . zolpidem (AMBIEN) 5 MG tablet Take 1 tablet (5 mg total) by mouth at bedtime as needed for sleep. 30 tablet 3   No current facility-administered medications for this visit.    Allergies  Allergen Reactions  . Morphine Sulfate Other (See Comments)    Makes her hyper    History   Social History  . Marital Status: Married    Spouse Name: N/A    Number of Children:  N/A  . Years of Education: N/A   Occupational History  . Not on file.   Social History Main Topics  . Smoking status: Former Smoker -- 0.30 packs/day for 10 years    Types: Cigarettes    Quit date: 03/25/1968  . 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 Alcohol Use: No  . Drug Use: No  . Sexual Activity: Not on file   Other Topics Concern  . Not on file   Social History Narrative     Review of Systems: General: negative for chills, fever, night sweats or weight changes.  Cardiovascular: negative for chest pain, dyspnea on exertion, edema, orthopnea, palpitations, paroxysmal nocturnal dyspnea or shortness of breath Dermatological: negative for  rash Respiratory: negative for cough or wheezing Urologic: negative for hematuria Abdominal: negative for nausea, vomiting, diarrhea, bright red blood per rectum, melena, or hematemesis Neurologic: negative for visual changes, syncope, or dizziness All other systems reviewed and are otherwise negative except as noted above.    Blood pressure 140/88, pulse 79, height 5\' 5"  (1.651 m), weight 148 lb 11.2 oz (67.45 kg).  General appearance: alert and no distress Neck: no adenopathy, no carotid bruit, no JVD, supple, symmetrical, trachea midline and thyroid not enlarged, symmetric, no tenderness/mass/nodules Lungs: clear to auscultation bilaterally Heart: regular rate and rhythm, S1, S2 normal, no murmur, click, rub or gallop Extremities: extremities normal, atraumatic, no cyanosis or edema  EKG normal sinus rhythm at 79 with left bundle branch block unchanged from prior EKGs  ASSESSMENT AND PLAN:   Essential hypertension Controlled on current medications  HYPERLIPIDEMIA On statin therapy with her most recent lipid profile performed 06/04/13 revealing a total cholesterol 197, LDL of 98 and HDL of [place] MD [facility] 02/01/2014 11:49 AM

## 2014-02-21 ENCOUNTER — Ambulatory Visit: Payer: Medicare Other | Admitting: Critical Care Medicine

## 2014-02-23 ENCOUNTER — Ambulatory Visit (INDEPENDENT_AMBULATORY_CARE_PROVIDER_SITE_OTHER): Payer: Medicare Other | Admitting: Critical Care Medicine

## 2014-02-23 ENCOUNTER — Encounter: Payer: Self-pay | Admitting: Critical Care Medicine

## 2014-02-23 VITALS — BP 138/76 | HR 84 | Temp 96.6°F | Ht 65.0 in | Wt 147.8 lb

## 2014-02-23 DIAGNOSIS — J4540 Moderate persistent asthma, uncomplicated: Secondary | ICD-10-CM | POA: Diagnosis not present

## 2014-02-23 NOTE — Patient Instructions (Signed)
No change in medications. Return in       12 months  

## 2014-02-23 NOTE — Progress Notes (Signed)
Subjective:    Patient ID: [name], female    DOB: [birth year], 80 y.o.   MRN: [id number]  HPI 80 yo WF with known hx of Asthma   02/23/2014 Chief Complaint  Patient presents with  . Follow-up    Has tension - believes this is contributing to SOB.  No wheezing or cough.  No recent issues.  Under stress with ill husband.  Pt notes some dyspnea on exertion.  Pt has some caregiver help.   Pt denies any significant sore throat, nasal congestion or excess secretions, fever, chills, sweats, unintended weight loss, pleurtic or exertional chest pain, orthopnea PND, or leg swelling Pt denies any increase in rescue therapy over baseline, denies waking up needing it or having any early am or nocturnal exacerbations of coughing/wheezing/or dyspnea. Pt also denies any obvious fluctuation in symptoms with  weather or environmental change or other alleviating or aggravating factors   Review of Systems Constitutional:   No  weight loss, night sweats,  Fevers, chills, fatigue, or  lassitude.  HEENT:   No headaches,  Difficulty swallowing,  Tooth/dental problems, or  Sore throat,                No sneezing, itching, ear ache,  +nasal congestion, post nasal drip,   CV:  No chest pain,  Orthopnea, PND, swelling in lower extremities, anasarca, dizziness, palpitations, syncope.   GI  No heartburn, indigestion, abdominal pain, nausea, vomiting, diarrhea, change in bowel habits, loss of appetite, bloody stools.   Resp:    No chest wall deformity  Skin: no rash or lesions.  GU: no dysuria, change in color of urine, no urgency or frequency.  No flank pain, no hematuria   MS:  No joint pain or swelling.  No decreased range of motion.  No back pain.  Psych:  No change in mood or affect. No depression or anxiety.  No memory loss.         Objective:   Physical Exam  GEN: A/Ox3; pleasant , NAD, well nourished   HEENT:  [place]/AT,  EACs-clear, TMs-wnl, NOSE-clear drainage , THROAT-clear, no  lesions,+ postnasal drip -clear    NECK:  Supple w/ fair ROM; no JVD; normal carotid impulses w/o bruits; no thyromegaly or nodules palpated; no lymphadenopathy.  RESP  Clear  P & A; w/o, wheezes/ rales/ or rhonchi.no accessory muscle use, no dullness to percussion  CARD:  RRR, no m/r/g  , no peripheral edema, pulses intact, no cyanosis or clubbing.  GI:   Soft & nt; nml bowel sounds; no organomegaly or masses detected.  Musco: Warm bil, no deformities or joint swelling noted.   Neuro: alert, no focal deficits noted.    Skin: Warm, no lesions or rashes        Assessment & Plan:   Asthma, moderate persistent Moderate persistent asthma stable at this time Plan Maintain inhaled medications as prescribed   Updated Medication List Outpatient Encounter Prescriptions as of 02/23/2014  Medication Sig  . acetaminophen (TYLENOL) 500 MG tablet Take 1,000 mg by mouth every 6 (six) hours as needed. For pain  . albuterol (PROAIR HFA) 108 (90 BASE) MCG/ACT inhaler Inhale 2 puffs into the lungs every 6 (six) hours as needed.  . budesonide-formoterol (SYMBICORT) 160-4.5 MCG/ACT inhaler Inhale 2 puffs into the lungs 2 (two) times daily.  . Calcium-Vitamin D (SUPER CALCIUM/D) 600-125 MG-UNIT TABS Take 1 tablet by mouth daily.   . cetirizine (ZYRTEC) 10 MG tablet Take 10 mg by  mouth daily.   . Cholecalciferol (VITAMIN D3) 1000 UNITS CAPS Take 1 capsule by mouth daily.   Mariane Baumgarten Sodium (STOOL SOFTENER) 100 MG capsule Take 100 mg by mouth daily.    . DULoxetine (CYMBALTA) 60 MG capsule Take 1 capsule (60 mg total) by mouth daily.  . fluticasone (FLONASE) 50 MCG/ACT nasal spray USE 2 SPRAYS INTO EACH NOSTRIL EVERY DAY  . meloxicam (MOBIC) 15 MG tablet Take 1 tablet (15 mg total) by mouth daily.  . metaxalone (SKELAXIN) 800 MG tablet Take 1 tablet (800 mg total) by mouth 3 (three) times daily as needed for muscle spasms.  . montelukast (SINGULAIR) 10 MG tablet Take 1 tablet (10 mg total) by mouth  at bedtime.  . Multiple Vitamin (MULTIVITAMIN) tablet Take 1 tablet by mouth daily.    Marland Kitchen nystatin (MYCOSTATIN/NYSTOP) 100000 UNIT/GM POWD Apply BID prn  . olmesartan (BENICAR) 20 MG tablet Take 1 tablet (20 mg total) by mouth daily.  Marland Kitchen omeprazole (PRILOSEC) 20 MG capsule Take 1 capsule (20 mg total) by mouth daily.  . pioglitazone (ACTOS) 30 MG tablet Take one-half tablet by  mouth daily  . rosuvastatin (CRESTOR) 20 MG tablet Take 1 tablet (20 mg total) by mouth daily.  . traMADol (ULTRAM) 50 MG tablet Take 50 mg by mouth every 6 (six) hours as needed.   . triamcinolone cream (KENALOG) 0.1 % Apply 1 application topically 2 (two) times daily as needed.  . zolpidem (AMBIEN) 5 MG tablet Take 1 tablet (5 mg total) by mouth at bedtime as needed for sleep.

## 2014-02-24 NOTE — Assessment & Plan Note (Signed)
Moderate persistent asthma stable at this time Plan Maintain inhaled medications as prescribed 

## 2014-03-08 DIAGNOSIS — M5415 Radiculopathy, thoracolumbar region: Secondary | ICD-10-CM | POA: Diagnosis not present

## 2014-03-08 DIAGNOSIS — M961 Postlaminectomy syndrome, not elsewhere classified: Secondary | ICD-10-CM | POA: Diagnosis not present

## 2014-03-14 ENCOUNTER — Encounter: Payer: Self-pay | Admitting: Internal Medicine

## 2014-03-15 ENCOUNTER — Encounter: Payer: Self-pay | Admitting: Internal Medicine

## 2014-03-16 ENCOUNTER — Encounter: Payer: Self-pay | Admitting: Internal Medicine

## 2014-03-16 ENCOUNTER — Other Ambulatory Visit: Payer: Self-pay

## 2014-03-16 MED ORDER — VALACYCLOVIR HCL 500 MG PO TABS: 500.0000 mg | ORAL_TABLET | Freq: Every day | ORAL | Status: DC | PRN

## 2014-03-21 DIAGNOSIS — L710 Perioral dermatitis: Secondary | ICD-10-CM | POA: Diagnosis not present

## 2014-04-25 ENCOUNTER — Other Ambulatory Visit: Payer: Self-pay | Admitting: Critical Care Medicine

## 2014-06-01 DIAGNOSIS — M5136 Other intervertebral disc degeneration, lumbar region: Secondary | ICD-10-CM | POA: Diagnosis not present

## 2014-06-14 ENCOUNTER — Encounter: Payer: Medicare Other | Admitting: Internal Medicine

## 2014-06-15 DIAGNOSIS — M5136 Other intervertebral disc degeneration, lumbar region: Secondary | ICD-10-CM | POA: Diagnosis not present

## 2014-06-15 DIAGNOSIS — M961 Postlaminectomy syndrome, not elsewhere classified: Secondary | ICD-10-CM | POA: Diagnosis not present

## 2014-06-20 ENCOUNTER — Telehealth: Payer: Self-pay | Admitting: Critical Care Medicine

## 2014-06-20 MED ORDER — LEVOFLOXACIN 500 MG PO TABS: 500.0000 mg | ORAL_TABLET | Freq: Every day | ORAL | Status: DC

## 2014-06-20 NOTE — Telephone Encounter (Signed)
Call in levoquin 500mg  daily x 5 days

## 2014-06-20 NOTE — Telephone Encounter (Signed)
Rx has been sent in per PW. Pt is aware. Nothing further was needed.

## 2014-06-20 NOTE — Telephone Encounter (Signed)
Spoke with pt. States that she has bronchitis. Reports deep cough with production of green mucus. SOB is also present. Denies fever or chest tightness.  PW - please advise. Thanks.

## 2014-06-27 ENCOUNTER — Other Ambulatory Visit: Payer: Self-pay

## 2014-06-27 NOTE — Telephone Encounter (Signed)
Spoke to [place], pt stated that she has not been able to sleep and that she did not feel like the cymbalta was doing everything that is was before. She wanted to know if she could have an rx for clonazepam to help with recent events that have been taking place.

## 2014-06-28 MED ORDER — CLONAZEPAM 0.5 MG PO TABS: 0.2500 mg | ORAL_TABLET | Freq: Every evening | ORAL | Status: DC | PRN

## 2014-06-28 NOTE — Telephone Encounter (Signed)
Ok to call in pended order as requested - see above - thanks

## 2014-06-28 NOTE — Telephone Encounter (Signed)
Spoke to pt to confirm the pharmacy to send rx to. Pt stated that she wanted it sent to mail order.   Rx called into [facility] as indicated below.

## 2014-07-04 ENCOUNTER — Encounter: Payer: Self-pay | Admitting: Internal Medicine

## 2014-07-04 ENCOUNTER — Ambulatory Visit (INDEPENDENT_AMBULATORY_CARE_PROVIDER_SITE_OTHER): Payer: Medicare Other | Admitting: Internal Medicine

## 2014-07-04 VITALS — BP 156/84 | HR 81 | Temp 97.6°F | Resp 16 | Wt 148.0 lb

## 2014-07-04 DIAGNOSIS — F411 Generalized anxiety disorder: Secondary | ICD-10-CM | POA: Diagnosis not present

## 2014-07-04 MED ORDER — DIAZEPAM 5 MG PO TABS: 5.0000 mg | ORAL_TABLET | Freq: Two times a day (BID) | ORAL | Status: DC | PRN

## 2014-07-04 MED ORDER — DULOXETINE HCL 60 MG PO CPEP: 60.0000 mg | ORAL_CAPSULE | Freq: Every day | ORAL | Status: DC

## 2014-07-04 MED ORDER — OMEPRAZOLE 20 MG PO CPDR: 20.0000 mg | DELAYED_RELEASE_CAPSULE | Freq: Every day | ORAL | Status: DC

## 2014-07-04 MED ORDER — MELOXICAM 15 MG PO TABS: 15.0000 mg | ORAL_TABLET | Freq: Every day | ORAL | Status: DC

## 2014-07-04 NOTE — Progress Notes (Signed)
   Subjective:    Patient ID: [name], female    DOB: [birth year], 81 y.o.   MRN: [id number]  HPI The patient is a 81 YO female who is coming in today to follow up on her anxiety. Her husband is in the end stages of lewy body dementia and hospice is in the house to help out. He is unable to control his actions and is occasionally violent. He is much calmer when she is around but it does not give her much time to relax. He does not hear well and cannot see which adds to his agitation. She is taking the cymbalta which helps some but she is needing something else to help with her stress. She does not have much time for exercise or for herself. Denies SI/HI. Has a daughter who helps to take away some of the stress.   Review of Systems  Constitutional: Positive for fatigue. Negative for fever, activity change, appetite change and unexpected weight change.  Respiratory: Negative.   Cardiovascular: Negative.   Neurological: Negative.   Psychiatric/Behavioral: Positive for dysphoric mood. Negative for suicidal ideas, sleep disturbance, self-injury, decreased concentration and agitation. The patient is nervous/anxious.       Objective:   Physical Exam  Constitutional: She is oriented to person, place, and time. She appears well-developed and well-nourished.  HENT:  Head: Normocephalic and atraumatic.  Cardiovascular: Normal rate and regular rhythm.   Pulmonary/Chest: Effort normal and breath sounds normal.  Abdominal: Soft.  Neurological: She is alert and oriented to person, place, and time. Coordination normal.  Skin: Skin is warm and dry.  Psychiatric: She has a normal mood and affect.  Slightly tearful at times.    Filed Vitals:   07/04/14 1048  BP: 156/84  Pulse: 81  Temp: 97.6 F (36.4 C)  TempSrc: Oral  Resp: 16  Weight: 148 lb (67.132 kg)  SpO2: 96%      Assessment & Plan:

## 2014-07-04 NOTE — Patient Instructions (Signed)
We will have you call us back when you have a couple weeks left of the benicar and we will send a replacement that is very similar to the [facility].   We have sent in valium to the [facility] and you can try using 1/2 or 1 pill up to twice a day for anxiety or stress. Keep taking the cymbalta as this will help to stabilize the mood somewhat.   If there is anything I can do to help out just let me know.

## 2014-07-04 NOTE — Assessment & Plan Note (Signed)
She had been doing okay with just cymbalta. Is not doing well with clonazepam. Will try valium which is longer acting. This will be a good short term solution for her. She does not wish to talk to a counselor. She is also having severe caretaker stress and would benefit from a respite stay however she does not feel that is possible at this time.

## 2014-07-04 NOTE — Progress Notes (Signed)
Pre visit review using our clinic review tool, if applicable. No additional management support is needed unless otherwise documented below in the visit note. 

## 2014-07-13 DIAGNOSIS — M5136 Other intervertebral disc degeneration, lumbar region: Secondary | ICD-10-CM | POA: Diagnosis not present

## 2014-07-25 DIAGNOSIS — H40013 Open angle with borderline findings, low risk, bilateral: Secondary | ICD-10-CM | POA: Diagnosis not present

## 2014-07-25 DIAGNOSIS — Z961 Presence of intraocular lens: Secondary | ICD-10-CM | POA: Diagnosis not present

## 2014-08-02 ENCOUNTER — Other Ambulatory Visit: Payer: Self-pay | Admitting: Internal Medicine

## 2014-08-02 NOTE — Telephone Encounter (Signed)
Faxed script back to [facility].../lmb 

## 2014-08-02 NOTE — Telephone Encounter (Signed)
Printed and signed.  

## 2014-08-02 NOTE — Telephone Encounter (Signed)
MD out of office pls advise on refill.../lmb 

## 2014-08-03 ENCOUNTER — Other Ambulatory Visit: Payer: Self-pay | Admitting: Internal Medicine

## 2014-08-03 ENCOUNTER — Other Ambulatory Visit: Payer: Self-pay | Admitting: Geriatric Medicine

## 2014-08-03 MED ORDER — DIAZEPAM 5 MG PO TABS: 5.0000 mg | ORAL_TABLET | Freq: Two times a day (BID) | ORAL | Status: DC | PRN

## 2014-08-04 ENCOUNTER — Encounter: Payer: Self-pay | Admitting: Internal Medicine

## 2014-08-09 ENCOUNTER — Encounter: Payer: Self-pay | Admitting: Internal Medicine

## 2014-08-09 ENCOUNTER — Other Ambulatory Visit: Payer: Self-pay | Admitting: Internal Medicine

## 2014-08-09 ENCOUNTER — Other Ambulatory Visit (INDEPENDENT_AMBULATORY_CARE_PROVIDER_SITE_OTHER): Payer: Medicare Other

## 2014-08-09 DIAGNOSIS — R300 Dysuria: Secondary | ICD-10-CM

## 2014-08-09 LAB — URINALYSIS, ROUTINE W REFLEX MICROSCOPIC
Bilirubin Urine: NEGATIVE
Ketones, ur: NEGATIVE
Nitrite: NEGATIVE
Specific Gravity, Urine: 1.01 (ref 1.000–1.030)
Total Protein, Urine: NEGATIVE
URINE GLUCOSE: NEGATIVE
UROBILINOGEN UA: 0.2 (ref 0.0–1.0)
pH: 6.5 (ref 5.0–8.0)

## 2014-08-09 MED ORDER — CIPROFLOXACIN HCL 250 MG PO TABS: 250.0000 mg | ORAL_TABLET | Freq: Two times a day (BID) | ORAL | Status: DC

## 2014-08-14 ENCOUNTER — Encounter: Payer: Self-pay | Admitting: Internal Medicine

## 2014-08-14 DIAGNOSIS — R300 Dysuria: Secondary | ICD-10-CM

## 2014-08-16 ENCOUNTER — Other Ambulatory Visit (INDEPENDENT_AMBULATORY_CARE_PROVIDER_SITE_OTHER): Payer: Medicare Other

## 2014-08-16 DIAGNOSIS — R300 Dysuria: Secondary | ICD-10-CM | POA: Diagnosis not present

## 2014-08-16 NOTE — Telephone Encounter (Signed)
Patient would like to know if she need to come back into the office and bring a urine sample or could Dr Doug Sou call some medication in for UTI, if so send [facility] please advise

## 2014-08-16 NOTE — Telephone Encounter (Signed)
She can come in to leave another specimen to check for infection.

## 2014-08-17 ENCOUNTER — Other Ambulatory Visit: Payer: Self-pay | Admitting: Internal Medicine

## 2014-08-17 LAB — URINALYSIS, ROUTINE W REFLEX MICROSCOPIC
BILIRUBIN URINE: NEGATIVE
Ketones, ur: NEGATIVE
NITRITE: NEGATIVE
PH: 6 (ref 5.0–8.0)
Total Protein, Urine: NEGATIVE
Urine Glucose: NEGATIVE
Urobilinogen, UA: 0.2 (ref 0.0–1.0)

## 2014-08-17 MED ORDER — NITROFURANTOIN MONOHYD MACRO 100 MG PO CAPS: 100.0000 mg | ORAL_CAPSULE | Freq: Two times a day (BID) | ORAL | Status: DC

## 2014-08-24 IMAGING — RF DG MYELOGRAM LUMBAR
5 of 16 series · 5 of 16 positions shown · IV contrast (omnipaque)
Comparison: MRI of the lumbar spine 06/30/2006.

CLINICAL DATA: Recurrent low back pain extending into the right
lower extremity.  The patient reports pain along the posterior
aspect of the lower extremity within an S1 distribution.

MYELOGRAM INJECTION
TECHNIQUE: Informed consent was obtained from the patient prior to
the procedure, including potential complications of headache,
allergy, infection and pain.  A timeout procedure was performed.
With the patient prone, the lower back was prepped with Betadine.
1% Lidocaine was used for local anesthesia.  Lumbar puncture was
performed at the right paramidline L2-3 level using a 22 gauge
needle with return of clear CSF.  15 ml of Omnipaque 513was
injected into the subarachnoid space .
TECHNIQUE: I personally performed the lumbar puncture and
administered the intrathecal contrast. I also personally supervised
acquisition of the myelogram images. Following injection of
intrathecal Omnipaque contrast, spine imaging in multiple
projections was performed using fluoroscopy.
Fluoroscopy Time: 30 seconds .
TECHNIQUE: CT imaging of the lumbar spine was performed after
intrathecal contrast administration.  Multiplanar CT image
reconstructions were also generated.

[Series 1: (hospital) · 1 of 1 slices shown]
[im 1/1]
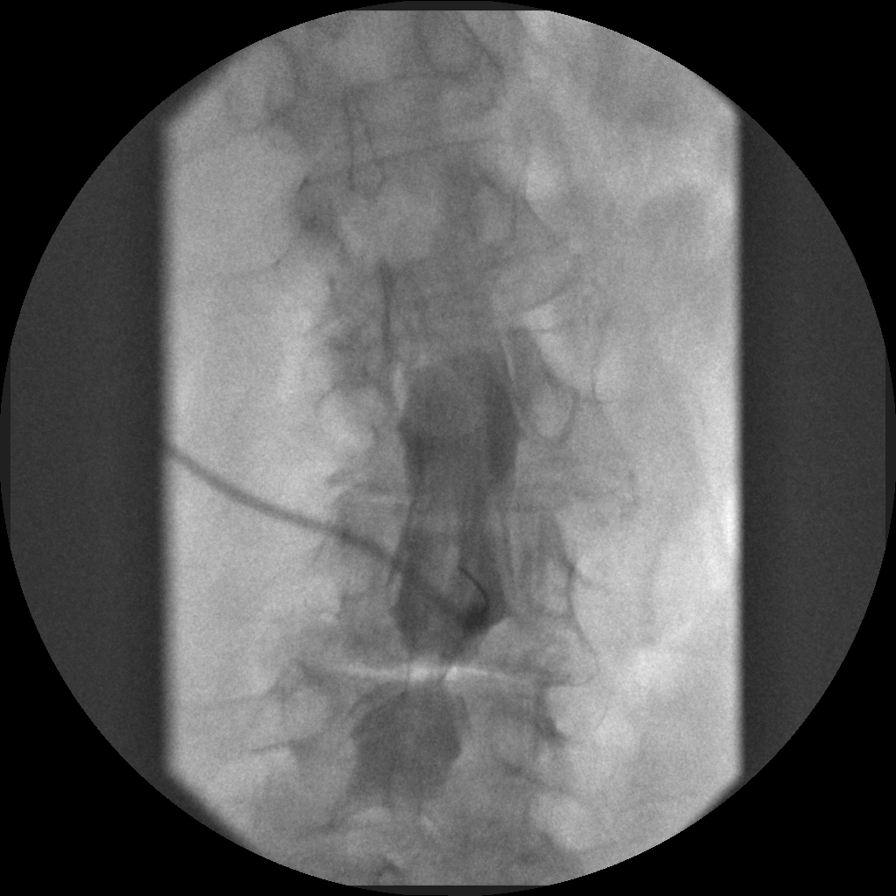

[Series 2: myelogram  white · 1 of 1 slices shown (1 of 4)]
[im 1/1]
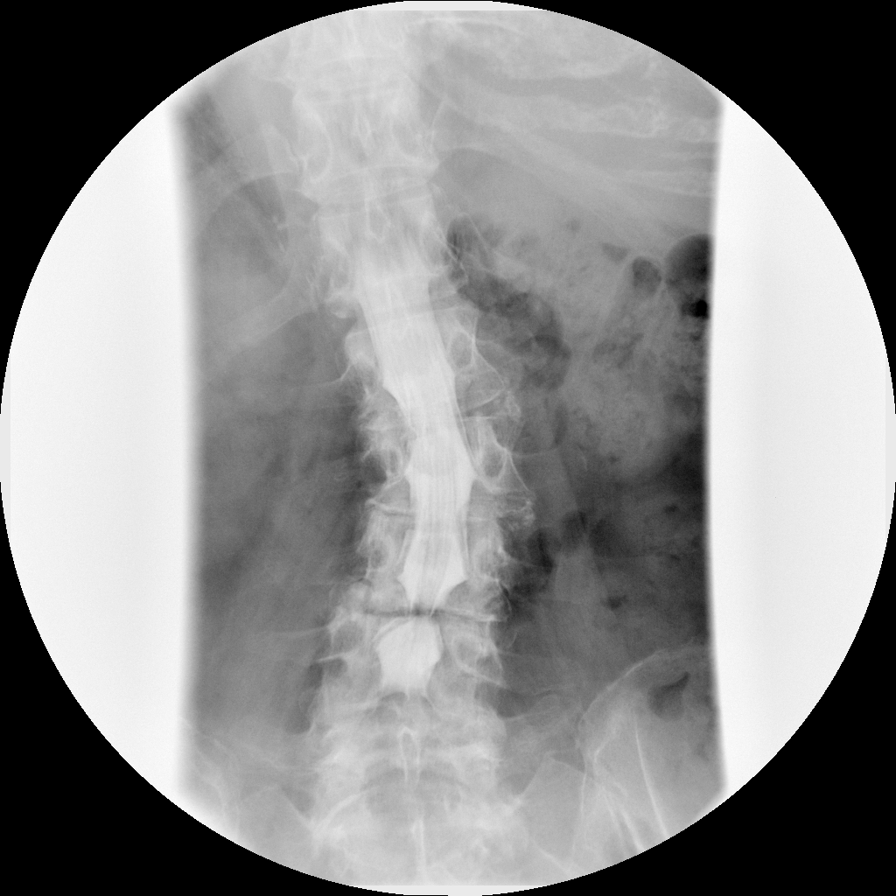

[Series 3: myelogram  white · 1 of 1 slices shown (2 of 4)]
[im 1/1]
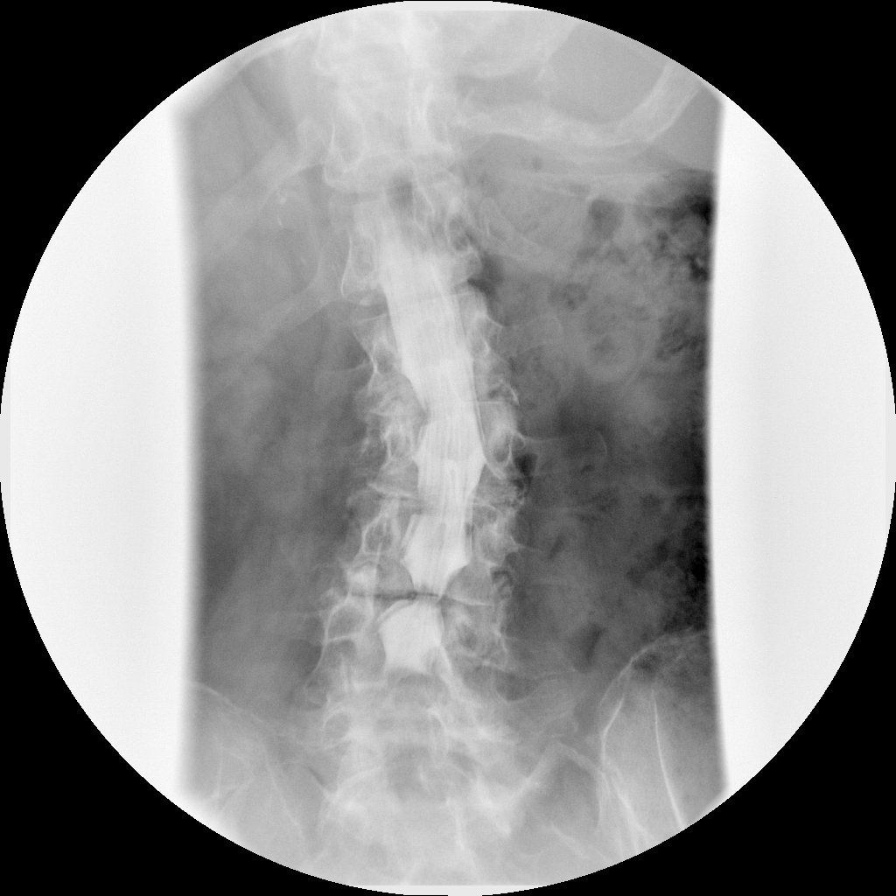

[Series 4: myelogram  white · 1 of 1 slices shown (3 of 4)]
[im 1/1]
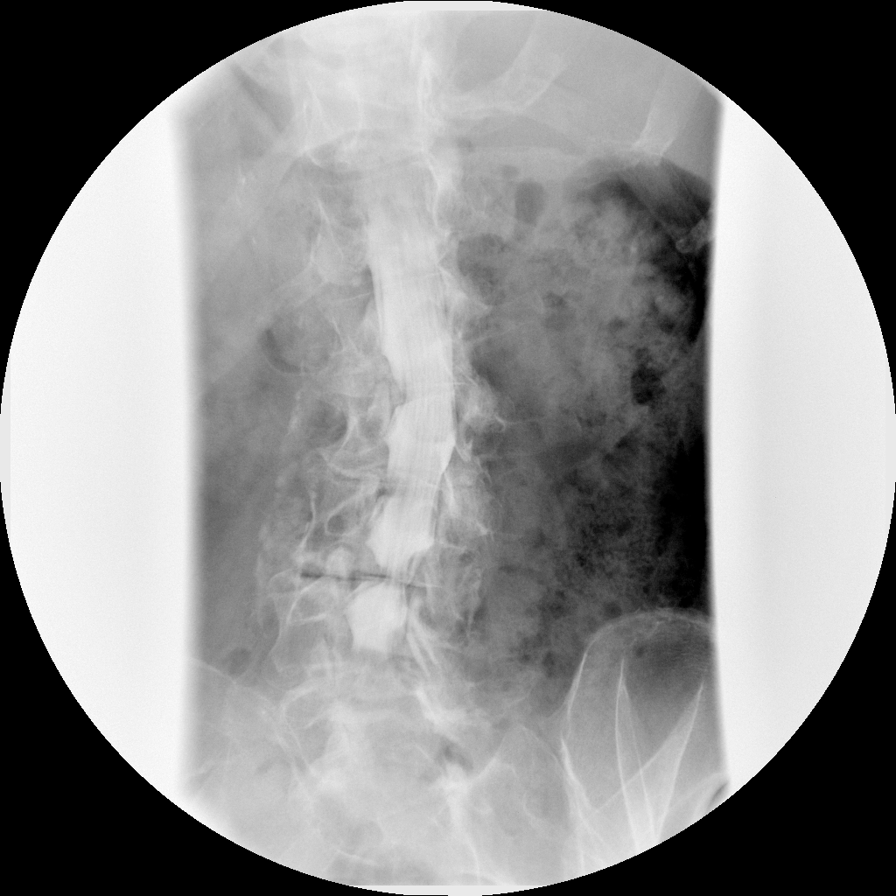

[Series 5: myelogram  white · 1 of 1 slices shown (4 of 4)]
[im 1/1]
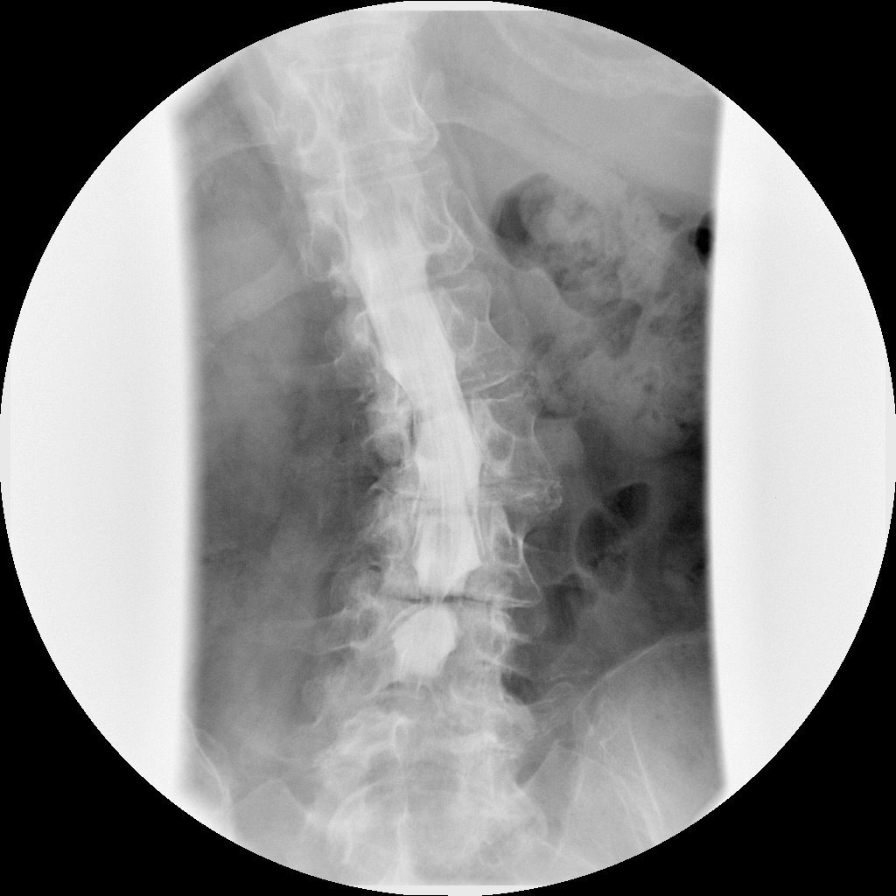

[5 of 16 positions shown; findings below may reference images not displayed]

IMPRESSION: Successful injection of  intrathecal contrast for myelography.

MYELOGRAM LUMBAR
FINDINGS: Five non-rib bearing lumbar type vertebral bodies are
present.  Rightward curvature of the lumbar spine is centered at
L2.  Moderate to severe central canal stenosis is present at L3-4.
There is lateral recess narrowing on the left, along the concave
aspect of the scoliosis at L1-2 and L2-3.  Moderate to severe
lateral recess narrowing is present bilaterally at L3-4 with
significant medial deviation of the lumbar nerve roots.

Moderate left lateral recess narrowing is also present at L4-5 with
impact on the traversing L5 nerve root.  There is mild right
lateral recess narrowing at L4-5 as well.  The L5-S1 disc level is
unremarkable.

Retrolisthesis at L2-3 is stable upon standing.  The disc disease,
particularly at L3-4 L4-5 does not change significantly with
standing.  There is no significant change with flexion or
extension.  Atherosclerotic calcifications are present in the aorta
and branch vessels.
IMPRESSION: 1.  Prominent disc herniation with moderate severe lateral recess
narrowing bilaterally at L3-4, right greater than left.
2.  Left lateral recess narrowing at L1-2 and L2-3.
3.  Mild lateral recess narrowing bilaterally L4-5 is worse on the
right.
4.  Rightward scoliosis is centered at L2.
5.  Atherosclerosis.


CT MYELOGRAPHY LUMBAR SPINE
FINDINGS: The lumbar spine is imaged from the midbody of T12-S2.
The conus medullaris terminates at L1, within normal limits.
Retrolisthesis at L2-3 appears less prominent than on the upright
images, suggesting there may be some motion at this level.
Alignment is otherwise anatomic.  Rightward curvature of lumbar
spine is centered at L2.  Vacuum phenomena are present across each
level.

Limited imaging of the abdomen demonstrates atherosclerotic
calcifications without significant stenosis.

T12-L1:  Asymmetric right-sided facet hypertrophy is present.
There is no significant disc herniation or stenosis.

L1-2:  Asymmetric left-sided facet hypertrophy and scoliosis
results in mild left lateral recess narrowing.  There is mild left
foraminal stenosis due to facet and endplate spurring.

L2-3:  Mild to moderate left lateral recess narrowing has
progressed some, secondary to a leftward disc herniation and facet
hypertrophy.  Mild left foraminal narrowing is similar to the prior
exam.  The right foramen is patent.  Bilateral facet hypertrophy is
evident.

L3-4:  Progressive facet hypertrophy is evident bilaterally.  This
results in moderate to severe lateral recess narrowing bilaterally.
The patient is status post laminectomy.  Moderate foraminal
stenosis is present bilaterally, secondary to a soft disc
herniation and facet hypertrophy. The patient is status post
laminectomy.

L4-5:  A broad-based disc herniation is asymmetric to the right.
Moderate facet hypertrophy is also asymmetric on the right.  This
results in moderate right and mild left lateral recess narrowing.
Moderate right foraminal stenosis is present.  The left foramen is
patent.

L5-S1:  Mild disc bulging is present.  There is mild facet
hypertrophy bilaterally.  No focal stenosis is evident.
IMPRESSION: 1.  Rightward curvature of the lumbar spine is centered at L2.
2.  Retrolisthesis at L2-3 is less prominent than on the plain
films.  There may be some motion at this level.
3.  Mild left foraminal stenosis at L1-2.
4.  Mild to moderate left lateral recess narrowing at L2-3 has
progressed some.  Mild left foraminal narrowing is similar to the
prior exam.
5.  Despite laminectomy, progressive facet hypertrophy and disc
herniation results in moderate to severe lateral recess narrowing
bilaterally at L3-4.
6.  Moderate foraminal stenosis bilaterally at L3-4.
7.  Moderate right and mild left lateral recess narrowing at L4-5.
8.  Moderate right foraminal stenosis at L4-5.
9.  Disc bulging and facet hypertrophy L5-S1 without significant
stenosis.

## 2014-08-26 DIAGNOSIS — R350 Frequency of micturition: Secondary | ICD-10-CM | POA: Diagnosis not present

## 2014-08-26 DIAGNOSIS — N3020 Other chronic cystitis without hematuria: Secondary | ICD-10-CM | POA: Diagnosis not present

## 2014-09-16 DIAGNOSIS — Z96643 Presence of artificial hip joint, bilateral: Secondary | ICD-10-CM | POA: Diagnosis not present

## 2014-09-16 DIAGNOSIS — Z471 Aftercare following joint replacement surgery: Secondary | ICD-10-CM | POA: Diagnosis not present

## 2014-09-19 ENCOUNTER — Other Ambulatory Visit: Payer: Self-pay

## 2014-09-23 ENCOUNTER — Ambulatory Visit: Payer: Medicare Other | Admitting: Internal Medicine

## 2014-09-26 ENCOUNTER — Encounter: Payer: Self-pay | Admitting: Internal Medicine

## 2014-09-27 MED ORDER — OLMESARTAN MEDOXOMIL 20 MG PO TABS: 20.0000 mg | ORAL_TABLET | Freq: Every day | ORAL | Status: DC

## 2014-10-10 ENCOUNTER — Encounter: Payer: Self-pay | Admitting: Gastroenterology

## 2014-10-24 ENCOUNTER — Encounter: Payer: Medicare Other | Admitting: Internal Medicine

## 2014-10-25 DIAGNOSIS — M5136 Other intervertebral disc degeneration, lumbar region: Secondary | ICD-10-CM | POA: Diagnosis not present

## 2014-10-27 ENCOUNTER — Telehealth: Payer: Self-pay

## 2014-10-27 NOTE — Telephone Encounter (Signed)
Spoke to Dtr regarding AWV as the patient was not available and apt made for 9:45 on 8/19 prior to seeing Dr. Doug Sou. [name] called in today to discuss and explained AWV and agreed to come in early at 9:30 or 9:45

## 2014-11-11 ENCOUNTER — Encounter: Payer: Self-pay | Admitting: Internal Medicine

## 2014-11-11 ENCOUNTER — Other Ambulatory Visit (INDEPENDENT_AMBULATORY_CARE_PROVIDER_SITE_OTHER): Payer: Medicare Other

## 2014-11-11 ENCOUNTER — Ambulatory Visit (INDEPENDENT_AMBULATORY_CARE_PROVIDER_SITE_OTHER): Payer: Medicare Other | Admitting: Internal Medicine

## 2014-11-11 VITALS — BP 170/88 | HR 72 | Temp 97.5°F | Resp 16 | Ht 64.0 in | Wt 150.8 lb

## 2014-11-11 DIAGNOSIS — D509 Iron deficiency anemia, unspecified: Secondary | ICD-10-CM

## 2014-11-11 DIAGNOSIS — Z780 Asymptomatic menopausal state: Secondary | ICD-10-CM

## 2014-11-11 DIAGNOSIS — Z0000 Encounter for general adult medical examination without abnormal findings: Secondary | ICD-10-CM | POA: Diagnosis not present

## 2014-11-11 DIAGNOSIS — I10 Essential (primary) hypertension: Secondary | ICD-10-CM

## 2014-11-11 DIAGNOSIS — R5383 Other fatigue: Secondary | ICD-10-CM | POA: Diagnosis not present

## 2014-11-11 DIAGNOSIS — F411 Generalized anxiety disorder: Secondary | ICD-10-CM | POA: Diagnosis not present

## 2014-11-11 DIAGNOSIS — J4530 Mild persistent asthma, uncomplicated: Secondary | ICD-10-CM

## 2014-11-11 DIAGNOSIS — E785 Hyperlipidemia, unspecified: Secondary | ICD-10-CM | POA: Diagnosis not present

## 2014-11-11 DIAGNOSIS — M5416 Radiculopathy, lumbar region: Secondary | ICD-10-CM

## 2014-11-11 DIAGNOSIS — J302 Other seasonal allergic rhinitis: Secondary | ICD-10-CM

## 2014-11-11 DIAGNOSIS — E119 Type 2 diabetes mellitus without complications: Secondary | ICD-10-CM | POA: Diagnosis not present

## 2014-11-11 LAB — COMPREHENSIVE METABOLIC PANEL
ALK PHOS: 59 U/L (ref 39–117)
ALT: 15 U/L (ref 0–35)
AST: 15 U/L (ref 0–37)
Albumin: 4.5 g/dL (ref 3.5–5.2)
BILIRUBIN TOTAL: 0.4 mg/dL (ref 0.2–1.2)
BUN: 17 mg/dL (ref 6–23)
CALCIUM: 9.5 mg/dL (ref 8.4–10.5)
CO2: 32 meq/L (ref 19–32)
CREATININE: 0.82 mg/dL (ref 0.40–1.20)
Chloride: 103 mEq/L (ref 96–112)
GFR: 71.38 mL/min (ref >60.00)
GLUCOSE: 94 mg/dL (ref 70–99)
Potassium: 4.6 mEq/L (ref 3.5–5.1)
Sodium: 141 mEq/L (ref 135–145)
TOTAL PROTEIN: 6.8 g/dL (ref 6.0–8.3)

## 2014-11-11 LAB — LIPID PANEL
CHOL/HDL RATIO: 3
Cholesterol: 208 mg/dL — ABNORMAL HIGH (ref 0–200)
HDL: 70.2 mg/dL (ref >39.00)
LDL Cholesterol: 116 mg/dL — ABNORMAL HIGH (ref 0–99)
NONHDL: 138.16
TRIGLYCERIDES: 112 mg/dL (ref 0.0–149.0)
VLDL: 22.4 mg/dL (ref 0.0–40.0)

## 2014-11-11 LAB — HEMOGLOBIN A1C: HEMOGLOBIN A1C: 5.9 % (ref 4.6–6.5)

## 2014-11-11 LAB — CBC
HCT: 39.6 % (ref 36.0–46.0)
Hemoglobin: 13.3 g/dL (ref 12.0–15.0)
MCHC: 33.5 g/dL (ref 30.0–36.0)
MCV: 92.1 fl (ref 78.0–100.0)
PLATELETS: 145 10*3/uL — AB (ref 150.0–400.0)
RBC: 4.3 Mil/uL (ref 3.87–5.11)
RDW: 15 % (ref 11.5–15.5)
WBC: 7 10*3/uL (ref 4.0–10.5)

## 2014-11-11 MED ORDER — OMEPRAZOLE 20 MG PO CPDR: 20.0000 mg | DELAYED_RELEASE_CAPSULE | Freq: Every day | ORAL | Status: DC

## 2014-11-11 MED ORDER — DULOXETINE HCL 60 MG PO CPEP: 60.0000 mg | ORAL_CAPSULE | Freq: Every day | ORAL | Status: DC

## 2014-11-11 MED ORDER — MONTELUKAST SODIUM 10 MG PO TABS: 10.0000 mg | ORAL_TABLET | Freq: Every day | ORAL | Status: DC

## 2014-11-11 MED ORDER — BUDESONIDE-FORMOTEROL FUMARATE 160-4.5 MCG/ACT IN AERO: 2.0000 | INHALATION_SPRAY | Freq: Two times a day (BID) | RESPIRATORY_TRACT | Status: DC

## 2014-11-11 MED ORDER — METAXALONE 800 MG PO TABS: 800.0000 mg | ORAL_TABLET | Freq: Three times a day (TID) | ORAL | Status: DC | PRN

## 2014-11-11 MED ORDER — MELOXICAM 15 MG PO TABS: 15.0000 mg | ORAL_TABLET | Freq: Every day | ORAL | Status: DC

## 2014-11-11 MED ORDER — ROSUVASTATIN CALCIUM 20 MG PO TABS: 20.0000 mg | ORAL_TABLET | Freq: Every day | ORAL | Status: DC

## 2014-11-11 MED ORDER — LOSARTAN POTASSIUM 50 MG PO TABS: 50.0000 mg | ORAL_TABLET | Freq: Every day | ORAL | Status: DC

## 2014-11-11 NOTE — Patient Instructions (Addendum)
Please stop taking the actos. This could be causing the feeling you are having in the morning.   We have sent in losartan which is a replacement for the benicar. It is once a day and can be taken morning or night.   Come back in about 6 months for a follow up.   Ms. [name] , Thank you for taking time to come for your Medicare Wellness Visit. I appreciate your ongoing commitment to your health goals. Please review the following plan we discussed and let me know if I can assist you in the future.   These are the goals we discussed: Goals    . Attending meditation / other stress management classes     Stress reduction; learn to live with stress; Setting boundaries for "down time"  Also eliminating any stressors than can be eliminated; Includes personnel etc. Made a list of contact       This is a list of the screening recommended for you and due dates:  Health Maintenance  Topic Date Due  . DEXA scan (bone density measurement)  05/02/2000  . Colon Cancer Screening  08/09/2012  . Complete foot exam   11/19/2013  . Urine Protein Check  11/19/2013  . Hemoglobin A1C  12/05/2013  . Flu Shot  10/24/2014  . Shingles Vaccine  11/13/2018*  . Eye exam for diabetics  07/24/2015  . Tetanus Vaccine  11/12/2020  . Pneumonia vaccines  Completed  *Topic was postponed. The date shown is not the original due date.     Health Maintenance Adopting a healthy lifestyle and getting preventive care can go a long way to promote health and wellness. Talk with your health care provider about what schedule of regular examinations is right for you. This is a good chance for you to check in with your provider about disease prevention and staying healthy. In between checkups, there are plenty of things you can do on your own. Experts have done a lot of research about which lifestyle changes and preventive measures are most likely to keep you healthy. Ask your health care provider for more information. WEIGHT  AND DIET  Eat a healthy diet  Be sure to include plenty of vegetables, fruits, low-fat dairy products, and lean protein.  Do not eat a lot of foods high in solid fats, added sugars, or salt.  Get regular exercise. This is one of the most important things you can do for your health.  Most adults should exercise for at least 150 minutes each week. The exercise should increase your heart rate and make you sweat (moderate-intensity exercise).  Most adults should also do strengthening exercises at least twice a week. This is in addition to the moderate-intensity exercise.  Maintain a healthy weight  Body mass index (BMI) is a measurement that can be used to identify possible weight problems. It estimates body fat based on height and weight. Your health care provider can help determine your BMI and help you achieve or maintain a healthy weight.  For females 39 years of age and older:   A BMI below 18.5 is considered underweight.  A BMI of 18.5 to 24.9 is normal.  A BMI of 25 to 29.9 is considered overweight.  A BMI of 30 and above is considered obese.  Watch levels of cholesterol and blood lipids  You should start having your blood tested for lipids and cholesterol at 79 years of age, then have this test every 5 years.  You may need to  have your cholesterol levels checked more often if:  Your lipid or cholesterol levels are high.  You are older than 79 years of age.  You are at high risk for heart disease.  CANCER SCREENING   Lung Cancer  Lung cancer screening is recommended for adults 57-11 years old who are at high risk for lung cancer because of a history of smoking.  A yearly low-dose CT scan of the lungs is recommended for people who:  Currently smoke.  Have quit within the past 15 years.  Have at least a 30-pack-year history of smoking. A pack year is smoking an average of one pack of cigarettes a day for 1 year.  Yearly screening should continue until it has  been 15 years since you quit.  Yearly screening should stop if you develop a health problem that would prevent you from having lung cancer treatment.  Breast Cancer  Practice breast self-awareness. This means understanding how your breasts normally appear and feel.  It also means doing regular breast self-exams. Let your health care provider know about any changes, no matter how small.  If you are in your 20s or 30s, you should have a clinical breast exam (CBE) by a health care provider every 1-3 years as part of a regular health exam.  If you are 8 or older, have a CBE every year. Also consider having a breast X-ray (mammogram) every year.  If you have a family history of breast cancer, talk to your health care provider about genetic screening.  If you are at high risk for breast cancer, talk to your health care provider about having an MRI and a mammogram every year.  Breast cancer gene (BRCA) assessment is recommended for women who have family members with BRCA-related cancers. BRCA-related cancers include:  Breast.  Ovarian.  Tubal.  Peritoneal cancers.  Results of the assessment will determine the need for genetic counseling and BRCA1 and BRCA2 testing. Cervical Cancer Routine pelvic examinations to screen for cervical cancer are no longer recommended for nonpregnant women who are considered low risk for cancer of the pelvic organs (ovaries, uterus, and vagina) and who do not have symptoms. A pelvic examination may be necessary if you have symptoms including those associated with pelvic infections. Ask your health care provider if a screening pelvic exam is right for you.   The Pap test is the screening test for cervical cancer for women who are considered at risk.  If you had a hysterectomy for a problem that was not cancer or a condition that could lead to cancer, then you no longer need Pap tests.  If you are older than 65 years, and you have had normal Pap tests for the  past 10 years, you no longer need to have Pap tests.  If you have had past treatment for cervical cancer or a condition that could lead to cancer, you need Pap tests and screening for cancer for at least 20 years after your treatment.  If you no longer get a Pap test, assess your risk factors if they change (such as having a new sexual partner). This can affect whether you should start being screened again.  Some women have medical problems that increase their chance of getting cervical cancer. If this is the case for you, your health care provider may recommend more frequent screening and Pap tests.  The human papillomavirus (HPV) test is another test that may be used for cervical cancer screening. The HPV test looks for the  virus that can cause cell changes in the cervix. The cells collected during the Pap test can be tested for HPV.  The HPV test can be used to screen women 1 years of age and older. Getting tested for HPV can extend the interval between normal Pap tests from three to five years.  An HPV test also should be used to screen women of any age who have unclear Pap test results.  After 79 years of age, women should have HPV testing as often as Pap tests.  Colorectal Cancer  This type of cancer can be detected and often prevented.  Routine colorectal cancer screening usually begins at 79 years of age and continues through 79 years of age.  Your health care provider may recommend screening at an earlier age if you have risk factors for colon cancer.  Your health care provider may also recommend using home test kits to check for hidden blood in the stool.  A small camera at the end of a tube can be used to examine your colon directly (sigmoidoscopy or colonoscopy). This is done to check for the earliest forms of colorectal cancer.  Routine screening usually begins at age 41.  Direct examination of the colon should be repeated every 5-10 years through 79 years of age. However,  you may need to be screened more often if early forms of precancerous polyps or small growths are found. Skin Cancer  Check your skin from head to toe regularly.  Tell your health care provider about any new moles or changes in moles, especially if there is a change in a mole's shape or color.  Also tell your health care provider if you have a mole that is larger than the size of a pencil eraser.  Always use sunscreen. Apply sunscreen liberally and repeatedly throughout the day.  Protect yourself by wearing long sleeves, pants, a wide-brimmed hat, and sunglasses whenever you are outside. HEART DISEASE, DIABETES, AND HIGH BLOOD PRESSURE   Have your blood pressure checked at least every 1-2 years. High blood pressure causes heart disease and increases the risk of stroke.  If you are between 66 years and 58 years old, ask your health care provider if you should take aspirin to prevent strokes.  Have regular diabetes screenings. This involves taking a blood sample to check your fasting blood sugar level.  If you are at a normal weight and have a low risk for diabetes, have this test once every three years after 79 years of age.  If you are overweight and have a high risk for diabetes, consider being tested at a younger age or more often. PREVENTING INFECTION  Hepatitis B  If you have a higher risk for hepatitis B, you should be screened for this virus. You are considered at high risk for hepatitis B if:  You were born in a country where hepatitis B is common. Ask your health care provider which countries are considered high risk.  Your parents were born in a high-risk country, and you have not been immunized against hepatitis B (hepatitis B vaccine).  You have HIV or AIDS.  You use needles to inject street drugs.  You live with someone who has hepatitis B.  You have had sex with someone who has hepatitis B.  You get hemodialysis treatment.  You take certain medicines for  conditions, including cancer, organ transplantation, and autoimmune conditions. Hepatitis C  Blood testing is recommended for:  Everyone born from [birth year] through 1963.  Anyone with  known risk factors for hepatitis C. Sexually transmitted infections (STIs)  You should be screened for sexually transmitted infections (STIs) including gonorrhea and chlamydia if:  You are sexually active and are younger than 79 years of age.  You are older than 79 years of age and your health care provider tells you that you are at risk for this type of infection.  Your sexual activity has changed since you were last screened and you are at an increased risk for chlamydia or gonorrhea. Ask your health care provider if you are at risk.  If you do not have HIV, but are at risk, it may be recommended that you take a prescription medicine daily to prevent HIV infection. This is called pre-exposure prophylaxis (PrEP). You are considered at risk if:  You are sexually active and do not regularly use condoms or know the HIV status of your partner(s).  You take drugs by injection.  You are sexually active with a partner who has HIV. Talk with your health care provider about whether you are at high risk of being infected with HIV. If you choose to begin PrEP, you should first be tested for HIV. You should then be tested every 3 months for as long as you are taking PrEP.  PREGNANCY   If you are premenopausal and you may become pregnant, ask your health care provider about preconception counseling.  If you may become pregnant, take 400 to 800 micrograms (mcg) of folic acid every day.  If you want to prevent pregnancy, talk to your health care provider about birth control (contraception). OSTEOPOROSIS AND MENOPAUSE   Osteoporosis is a disease in which the bones lose minerals and strength with aging. This can result in serious bone fractures. Your risk for osteoporosis can be identified using a bone density scan.  If  you are 22 years of age or older, or if you are at risk for osteoporosis and fractures, ask your health care provider if you should be screened.  Ask your health care provider whether you should take a calcium or vitamin D supplement to lower your risk for osteoporosis.  Menopause may have certain physical symptoms and risks.  Hormone replacement therapy may reduce some of these symptoms and risks. Talk to your health care provider about whether hormone replacement therapy is right for you.  HOME CARE INSTRUCTIONS   Schedule regular health, dental, and eye exams.  Stay current with your immunizations.   Do not use any tobacco products including cigarettes, chewing tobacco, or electronic cigarettes.  If you are pregnant, do not drink alcohol.  If you are breastfeeding, limit how much and how often you drink alcohol.  Limit alcohol intake to no more than 1 drink per day for nonpregnant women. One drink equals 12 ounces of beer, 5 ounces of wine, or 1 ounces of hard liquor.  Do not use street drugs.  Do not share needles.  Ask your health care provider for help if you need support or information about quitting drugs.  Tell your health care provider if you often feel depressed.  Tell your health care provider if you have ever been abused or do not feel safe at home. Document Released: 09/24/2010 Document Revised: 07/26/2013 Document Reviewed: 02/10/2013 [facility] Patient Information 2015 [place], [place]. This information is not intended to replace advice given to you by your health care provider. Make sure you discuss any questions you have with your health care provider.  Health Maintenance Adopting a healthy lifestyle and getting preventive  care can go a long way to promote health and wellness. Talk with your health care provider about what schedule of regular examinations is right for you. This is a good chance for you to check in with your provider about disease prevention and  staying healthy. In between checkups, there are plenty of things you can do on your own. Experts have done a lot of research about which lifestyle changes and preventive measures are most likely to keep you healthy. Ask your health care provider for more information. WEIGHT AND DIET  Eat a healthy diet  Be sure to include plenty of vegetables, fruits, low-fat dairy products, and lean protein.  Do not eat a lot of foods high in solid fats, added sugars, or salt.  Get regular exercise. This is one of the most important things you can do for your health.  Most adults should exercise for at least 150 minutes each week. The exercise should increase your heart rate and make you sweat (moderate-intensity exercise).  Most adults should also do strengthening exercises at least twice a week. This is in addition to the moderate-intensity exercise.  Maintain a healthy weight  Body mass index (BMI) is a measurement that can be used to identify possible weight problems. It estimates body fat based on height and weight. Your health care provider can help determine your BMI and help you achieve or maintain a healthy weight.  For females 54 years of age and older:   A BMI below 18.5 is considered underweight.  A BMI of 18.5 to 24.9 is normal.  A BMI of 25 to 29.9 is considered overweight.  A BMI of 30 and above is considered obese.  Watch levels of cholesterol and blood lipids  You should start having your blood tested for lipids and cholesterol at 79 years of age, then have this test every 5 years.  You may need to have your cholesterol levels checked more often if:  Your lipid or cholesterol levels are high.  You are older than 79 years of age.  You are at high risk for heart disease.  CANCER SCREENING   Lung Cancer  Lung cancer screening is recommended for adults 88-11 years old who are at high risk for lung cancer because of a history of smoking.  A yearly low-dose CT scan of the  lungs is recommended for people who:  Currently smoke.  Have quit within the past 15 years.  Have at least a 30-pack-year history of smoking. A pack year is smoking an average of one pack of cigarettes a day for 1 year.  Yearly screening should continue until it has been 15 years since you quit.  Yearly screening should stop if you develop a health problem that would prevent you from having lung cancer treatment.  Breast Cancer  Practice breast self-awareness. This means understanding how your breasts normally appear and feel.  It also means doing regular breast self-exams. Let your health care provider know about any changes, no matter how small.  If you are in your 20s or 30s, you should have a clinical breast exam (CBE) by a health care provider every 1-3 years as part of a regular health exam.  If you are 68 or older, have a CBE every year. Also consider having a breast X-ray (mammogram) every year.  If you have a family history of breast cancer, talk to your health care provider about genetic screening.  If you are at high risk for breast cancer, talk to  your health care provider about having an MRI and a mammogram every year.  Breast cancer gene (BRCA) assessment is recommended for women who have family members with BRCA-related cancers. BRCA-related cancers include:  Breast.  Ovarian.  Tubal.  Peritoneal cancers.  Results of the assessment will determine the need for genetic counseling and BRCA1 and BRCA2 testing. Cervical Cancer Routine pelvic examinations to screen for cervical cancer are no longer recommended for nonpregnant women who are considered low risk for cancer of the pelvic organs (ovaries, uterus, and vagina) and who do not have symptoms. A pelvic examination may be necessary if you have symptoms including those associated with pelvic infections. Ask your health care provider if a screening pelvic exam is right for you.   The Pap test is the screening test  for cervical cancer for women who are considered at risk.  If you had a hysterectomy for a problem that was not cancer or a condition that could lead to cancer, then you no longer need Pap tests.  If you are older than 65 years, and you have had normal Pap tests for the past 10 years, you no longer need to have Pap tests.  If you have had past treatment for cervical cancer or a condition that could lead to cancer, you need Pap tests and screening for cancer for at least 20 years after your treatment.  If you no longer get a Pap test, assess your risk factors if they change (such as having a new sexual partner). This can affect whether you should start being screened again.  Some women have medical problems that increase their chance of getting cervical cancer. If this is the case for you, your health care provider may recommend more frequent screening and Pap tests.  The human papillomavirus (HPV) test is another test that may be used for cervical cancer screening. The HPV test looks for the virus that can cause cell changes in the cervix. The cells collected during the Pap test can be tested for HPV.  The HPV test can be used to screen women 35 years of age and older. Getting tested for HPV can extend the interval between normal Pap tests from three to five years.  An HPV test also should be used to screen women of any age who have unclear Pap test results.  After 79 years of age, women should have HPV testing as often as Pap tests.  Colorectal Cancer  This type of cancer can be detected and often prevented.  Routine colorectal cancer screening usually begins at 79 years of age and continues through 79 years of age.  Your health care provider may recommend screening at an earlier age if you have risk factors for colon cancer.  Your health care provider may also recommend using home test kits to check for hidden blood in the stool.  A small camera at the end of a tube can be used to  examine your colon directly (sigmoidoscopy or colonoscopy). This is done to check for the earliest forms of colorectal cancer.  Routine screening usually begins at age 82.  Direct examination of the colon should be repeated every 5-10 years through 79 years of age. However, you may need to be screened more often if early forms of precancerous polyps or small growths are found. Skin Cancer  Check your skin from head to toe regularly.  Tell your health care provider about any new moles or changes in moles, especially if there is a change in  a mole's shape or color.  Also tell your health care provider if you have a mole that is larger than the size of a pencil eraser.  Always use sunscreen. Apply sunscreen liberally and repeatedly throughout the day.  Protect yourself by wearing long sleeves, pants, a wide-brimmed hat, and sunglasses whenever you are outside. HEART DISEASE, DIABETES, AND HIGH BLOOD PRESSURE   Have your blood pressure checked at least every 1-2 years. High blood pressure causes heart disease and increases the risk of stroke.  If you are between 68 years and 62 years old, ask your health care provider if you should take aspirin to prevent strokes.  Have regular diabetes screenings. This involves taking a blood sample to check your fasting blood sugar level.  If you are at a normal weight and have a low risk for diabetes, have this test once every three years after 79 years of age.  If you are overweight and have a high risk for diabetes, consider being tested at a younger age or more often. PREVENTING INFECTION  Hepatitis B  If you have a higher risk for hepatitis B, you should be screened for this virus. You are considered at high risk for hepatitis B if:  You were born in a country where hepatitis B is common. Ask your health care provider which countries are considered high risk.  Your parents were born in a high-risk country, and you have not been immunized against  hepatitis B (hepatitis B vaccine).  You have HIV or AIDS.  You use needles to inject street drugs.  You live with someone who has hepatitis B.  You have had sex with someone who has hepatitis B.  You get hemodialysis treatment.  You take certain medicines for conditions, including cancer, organ transplantation, and autoimmune conditions. Hepatitis C  Blood testing is recommended for:  Everyone born from [birth year] through 1963.  Anyone with known risk factors for hepatitis C. Sexually transmitted infections (STIs)  You should be screened for sexually transmitted infections (STIs) including gonorrhea and chlamydia if:  You are sexually active and are younger than 79 years of age.  You are older than 79 years of age and your health care provider tells you that you are at risk for this type of infection.  Your sexual activity has changed since you were last screened and you are at an increased risk for chlamydia or gonorrhea. Ask your health care provider if you are at risk.  If you do not have HIV, but are at risk, it may be recommended that you take a prescription medicine daily to prevent HIV infection. This is called pre-exposure prophylaxis (PrEP). You are considered at risk if:  You are sexually active and do not regularly use condoms or know the HIV status of your partner(s).  You take drugs by injection.  You are sexually active with a partner who has HIV. Talk with your health care provider about whether you are at high risk of being infected with HIV. If you choose to begin PrEP, you should first be tested for HIV. You should then be tested every 3 months for as long as you are taking PrEP.  PREGNANCY   If you are premenopausal and you may become pregnant, ask your health care provider about preconception counseling.  If you may become pregnant, take 400 to 800 micrograms (mcg) of folic acid every day.  If you want to prevent pregnancy, talk to your health care provider  about birth control (contraception). OSTEOPOROSIS  AND MENOPAUSE   Osteoporosis is a disease in which the bones lose minerals and strength with aging. This can result in serious bone fractures. Your risk for osteoporosis can be identified using a bone density scan.  If you are 21 years of age or older, or if you are at risk for osteoporosis and fractures, ask your health care provider if you should be screened.  Ask your health care provider whether you should take a calcium or vitamin D supplement to lower your risk for osteoporosis.  Menopause may have certain physical symptoms and risks.  Hormone replacement therapy may reduce some of these symptoms and risks. Talk to your health care provider about whether hormone replacement therapy is right for you.  HOME CARE INSTRUCTIONS   Schedule regular health, dental, and eye exams.  Stay current with your immunizations.   Do not use any tobacco products including cigarettes, chewing tobacco, or electronic cigarettes.  If you are pregnant, do not drink alcohol.  If you are breastfeeding, limit how much and how often you drink alcohol.  Limit alcohol intake to no more than 1 drink per day for nonpregnant women. One drink equals 12 ounces of beer, 5 ounces of wine, or 1 ounces of hard liquor.  Do not use street drugs.  Do not share needles.  Ask your health care provider for help if you need support or information about quitting drugs.  Tell your health care provider if you often feel depressed.  Tell your health care provider if you have ever been abused or do not feel safe at home. Document Released: 09/24/2010 Document Revised: 07/26/2013 Document Reviewed: 02/10/2013 [facility] Patient Information 2015 [place], [place]. This information is not intended to replace advice given to you by your health care provider. Make sure you discuss any questions you have with your health care provider.

## 2014-11-11 NOTE — Assessment & Plan Note (Signed)
Much improved with the cymbalta and valium prn. She is doing much better and still dealing with significant stressors in her life. Continue regimen and would continue until her stressors have decreased.

## 2014-11-11 NOTE — Assessment & Plan Note (Signed)
Uses skelaxin intermittently for pain. Suspect that the cymbalta has helped this some as well.

## 2014-11-11 NOTE — Assessment & Plan Note (Addendum)
Doing well on symbicort, montelukast, albuterol prn (has not used much recently). Will refill and still consider her to be mild persistent and continue symbicort and hers is triggered more by her allergic rhinitis which is finally better controlled on zyrtec, flonase, montelukast.

## 2014-11-11 NOTE — Assessment & Plan Note (Signed)
Switch benicar to losartan for better insurance coverage. Mildly elevated today and previous have all been at goal. Will recheck at home and if persistently elevated will increase to 2 drug regimen. Checking CMP today.

## 2014-11-11 NOTE — Progress Notes (Signed)
Medical screening examination/treatment/procedure(s) were performed by non-physician practitioner and as supervising physician I was immediately available for consultation/collaboration. I agree with above. Kollar, Elizabeth A, MD   

## 2014-11-11 NOTE — Assessment & Plan Note (Signed)
Contributes to her asthma and is well controlled right now on her flonase and zyrtec.

## 2014-11-11 NOTE — Progress Notes (Signed)
Subjective:   URIJAH RAYNOR is a 81 y.o. female who presents for Medicare Annual (Subsequent) preventive examination.  Review of Systems:   Cardiac Risk Factors include: advanced age (>64men, >52 women);diabetes mellitus;dyslipidemia;hypertension HRA assessment completed during visit;  Patient is here for Annual Wellness Assessment/    Reviewed problem list; Conditions are being managed medically;  Will educate for lifestyle changes as appropriate for HTN; Asthma; hyperlipidemia   Dx DM2 with hx of diabetes noted in 2008 at this practice; Notes reveal the patient is intolerance of metformin trial - on actos w/o change since that time. 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 Spouse's health declined rapidly after a fall in Sept of 2015;  Now mental status controlled on Ativan and Seroquel without angry outburst; bad dreams and inappropriate behavior; sleeps a lot;  Plan to provide ongoing care at home; dining room not converted to living room.  Dtr comes and walks the dog; Enjoys time to herself to deal with stress; puzzles, the paper; crochets some;  Spouse is also blind since 2006  and deaf secondary to stroke. Doesn't speak well enough to ask for help Stressor is housekeeper which may need to be removed for now; speaks loudly and cries with her spouse  Advance Directive; spouse was coherent a couple of months ago to discuss his funeral in very literal terms; discussing with family what he wants. Asked to see lawyer and accountant and discussed financial situation with wife/ even spoke to his wife about new car that she would need to buy.  Family Hx OA and  HTN mother Heart Disease and HTN: Father; deceased with MI  Social history: Lives in 2 story home; remodeled downstairs so they can live in 3 rooms. Wife retreats upstairs;    Fall in January 2014 noted but did not discuss; No falls this year; Looks young for her stated age; very mobile  Problem list review for risk  Lipids hdl 69;/ cho 197; trig 151/ LDL 98/ Ratio 3 BMI: <25 ; eating well; sleeping well Diet; satisfactory;  Exercise; not completing due to caregiving role  Screenings overdue Ophthalmology exam; eye exam; Dr. Carolynn Sayers; I will call and get result; No glaucoma; cataracts removed 2004 Dexa scan; States she had a bone scan and it was fine; No hx of osteopenia but this was completed a long time ago. Will order repeat  Colonoscopy; 07/2002 normal and the doctor told her she would not need to go back Foot Exam; deferred to Dr. Waylan Boga deferred to Dr. Doug Sou on exam A1c; deferred to Dr. Doug Sou on exam _________________________ Immunizations; Pneumonia series completed Shingles/ Delay 2020 EKG 02/01/2014 Mammogram 05/2011/ No specific mammographic evidence of malignancy/ declines repeating at this time; no hx of breast cancer in family. Hearing: 2000 hz both ears Dental: No issue voiced  Tetanus due 2022   Current Care Team reviewed and updated Needs new physician for pulmonary as Dr. Joya Gaskins left;     Objective:     Vitals: BP 170/88 mmHg  Pulse 72  Temp(Src) 97.5 F (36.4 C) (Oral)  Resp 16  Ht 5\' 4"  (1.626 m)  Wt 150 lb 12 oz (68.38 kg)  BMI 25.86 kg/m2  SpO2 90%  Tobacco History  Smoking status  . Former Smoker -- 0.30 packs/day for 10 years  . Types: Cigarettes  . Quit date: 03/25/1968  Smokeless tobacco  . Never Used    Comment: Married with two children. 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 ARTHRITIS     s/p bilateral THR  . MIGRAINE HEADACHE   . Interstitial cystitis   .  ALLERGIC RHINITIS   . ANEMIA, IRON DEFICIENCY   . ANXIETY   . ASTHMA, EXTRINSIC   . HYPERTENSION   . HYPERLIPIDEMIA   . G E R D   . DIABETES MELLITUS, TYPE II   . FIBROMYALGIA     fibromyalgia  . Chronic constipation   . Bronchitis   . High cholesterol   . Cataracts, both eyes    Past Surgical History  Procedure Laterality Date  . Cataract extraction    . Total hip arthroplasty    . Abdominal hysterectomy    . Ovarian cyst removed    . Lumbar disc surgery    . Colonoscopy    . Esophagogastroduodenoscopy endoscopy    . Doppler echocardiography    . Nm myoview ltd      stress test   Family History  Problem Relation Age of Onset  . Arthritis Mother   . Hypertension Mother   . Heart disease Father   . Arthritis Father   . Hypertension Father   . Depression Sister   . Hyperlipidemia Sister   . Hypertension Other   . Hyperlipidemia Other    History  Sexual Activity  . Sexual Activity: Not on file    Outpatient Encounter Prescriptions as of 11/11/2014  Medication Sig  . acetaminophen (TYLENOL) 500 MG tablet Take 1,000 mg by mouth every 6 (six) hours as needed. For pain  . albuterol (PROAIR HFA) 108 (90 BASE) MCG/ACT inhaler Inhale 2 puffs into the lungs every 6 (six) hours as needed.  . budesonide-formoterol (SYMBICORT) 160-4.5 MCG/ACT inhaler Inhale 2 puffs into the lungs 2 (two) times daily.  . cetirizine (ZYRTEC) 10 MG tablet Take 10 mg by mouth daily.   . Cholecalciferol (VITAMIN D3) 1000 UNITS CAPS Take 1 capsule by mouth daily.   . diazepam (VALIUM) 5 MG tablet Take 1 tablet (5 mg total) by mouth every 12 (twelve) hours as needed. for anxiety  . Docusate Sodium (STOOL SOFTENER) 100 MG capsule Take 100 mg by mouth daily.    . DULoxetine (CYMBALTA) 60 MG capsule Take 1 capsule (60 mg total) by mouth daily.  . fluticasone (FLONASE) 50 MCG/ACT nasal spray USE 2 SPRAYS INTO EACH NOSTRIL EVERY DAY  . meloxicam (MOBIC) 15 MG tablet Take 1 tablet (15 mg total) by mouth  daily.  . metaxalone (SKELAXIN) 800 MG tablet Take 1 tablet (800 mg total) by mouth 3 (three) times daily as needed for muscle spasms.  . montelukast (SINGULAIR) 10 MG tablet Take 1 tablet (10 mg total) by mouth at bedtime.  Marland Kitchen nystatin (MYCOSTATIN/NYSTOP) 100000 UNIT/GM POWD Apply BID prn  . omeprazole (PRILOSEC) 20 MG capsule Take 1 capsule (20 mg total) by mouth daily.  . pioglitazone (ACTOS) 30 MG tablet Take one-half tablet by  mouth daily  . rosuvastatin (CRESTOR) 20 MG tablet Take 1 tablet (20 mg total) by mouth daily.  . traMADol (ULTRAM) 50 MG tablet Take 50 mg  by mouth every 6 (six) hours as needed.   . triamcinolone cream (KENALOG) 0.1 % Apply 1 application topically 2 (two) times daily as needed.  . valACYclovir (VALTREX) 500 MG tablet Take 1 tablet (500 mg total) by mouth daily as needed (for break outs).  . zolpidem (AMBIEN) 5 MG tablet Take 1 tablet (5 mg total) by mouth at bedtime as needed for sleep.  . [DISCONTINUED] budesonide-formoterol (SYMBICORT) 160-4.5 MCG/ACT inhaler Inhale 2 puffs into the lungs 2 (two) times daily.  . [DISCONTINUED] DULoxetine (CYMBALTA) 60 MG capsule Take 1 capsule (60 mg total) by mouth daily.  . [DISCONTINUED] meloxicam (MOBIC) 15 MG tablet Take 1 tablet (15 mg total) by mouth daily.  . [DISCONTINUED] metaxalone (SKELAXIN) 800 MG tablet Take 1 tablet (800 mg total) by mouth 3 (three) times daily as needed for muscle spasms.  . [DISCONTINUED] montelukast (SINGULAIR) 10 MG tablet Take 1 tablet by mouth at  bedtime  . [DISCONTINUED] olmesartan (BENICAR) 20 MG tablet Take 1 tablet (20 mg total) by mouth daily.  . [DISCONTINUED] omeprazole (PRILOSEC) 20 MG capsule Take 1 capsule (20 mg total) by mouth daily.  . [DISCONTINUED] rosuvastatin (CRESTOR) 20 MG tablet Take 1 tablet (20 mg total) by mouth daily.  . Calcium-Vitamin D (SUPER CALCIUM/D) 600-125 MG-UNIT TABS Take 1 tablet by mouth daily.   Marland Kitchen losartan (COZAAR) 50 MG tablet Take 1 tablet (50 mg total)  by mouth daily.  . Multiple Vitamin (MULTIVITAMIN) tablet Take 1 tablet by mouth daily.    . [DISCONTINUED] nitrofurantoin, macrocrystal-monohydrate, (MACROBID) 100 MG capsule Take 1 capsule (100 mg total) by mouth 2 (two) times daily. (Patient not taking: Reported on 11/11/2014)   No facility-administered encounter medications on file as of 11/11/2014.    Activities of Daily Living In your present state of health, do you have any difficulty performing the following activities: 11/11/2014  Hearing? N  Vision? N  Difficulty concentrating or making decisions? N  Walking or climbing stairs? N  Dressing or bathing? N  Doing errands, shopping? N  Preparing Food and eating ? N  Using the Toilet? N  In the past six months, have you accidently leaked urine? N  Do you have problems with loss of bowel control? N  Managing your Medications? N  Managing your Finances? N  Housekeeping or managing your Housekeeping? N    Patient Care Team: Olga Millers, MD as PCP - General (Internal Medicine) Elsie Stain, MD as Referring Physician (Pulmonary Disease) Ladene Artist, MD as Referring Physician (Gastroenterology) Myrlene Broker, MD as Referring Physician (Urology) Clent Jacks, MD as Referring Physician (Ophthalmology) Nobie Putnam, MD as Referring Physician (Hematology and Oncology) Latanya Maudlin, MD as Referring Physician (Orthopedic Surgery) Theodis Sato, MD as Referring Physician (Orthopedic Surgery) [name], MD as Referring Physician (Cardiology)    Assessment:     The goal of the wellness visit is to assist the patient how to close the gaps in care and create a preventative care plan for the patient.  Personalized Education was given regarding stress reduction; the acknowledges sad moods but stable for now; medication helping; states she has a good support system; does puzzles and crochet for stress relief. Also retreats to upstairs to for time alone.   Assessment  included: smoking (secondary) hx reviewed; discussed Bone density scan and last scan many years ago was normal; Agreed to have one this year; Taking meds without issues; no barriers identified except with Benicar due to expense and  will discuss with Dr. Doug Sou; to recheck A1c and creat/albumin    No Risk for hepatitis or high risk social behavior identified via hepatitis screen Vaccines up to date; Cognition assessed by AD8; Score 0 MMSE deferred as the patient stated they had no memory issues; No identified risk were noted; The patient was oriented x 3; appropriate in dress and manner and no objective failures at ADL's or IADL's. Good historian at today's visit  Depression screen negative but admits to grief but coping at present. Functional status reviewed; no losses in function x 1 year  Bowel and bladder issues assessed  End of life planning was discussed and spouse and her have completed and reviewed recently   Exercise Activities and Dietary recommendations Current Exercise Habits:: Exercise is limited by, Limited by:: orthopedic condition(s) (long time back issues being treated)  Goals    . Attending meditation / other stress management classes     Stress reduction; learn to live with stress; Setting boundaries for "down time"  Also eliminating any stressors than can be eliminated; Includes personnel etc. Made a list of contact      Fall Risk Fall Risk  11/11/2014 11/19/2012  Falls in the past year? No No   Depression Screen PHQ 2/9 Scores 11/11/2014 11/19/2012  PHQ - 2 Score 0 1     Cognitive Testing No flowsheet data found.  Immunization History  Administered Date(s) Administered  . Influenza Split 01/03/2012  . Influenza Whole 12/19/2008, 12/23/2009, 11/13/2010  . Influenza, High Dose Seasonal PF 12/13/2013  . Influenza,inj,Quad PF,36+ Mos 11/19/2012  . Pneumococcal Conjugate-13 01/28/2013  . Pneumococcal Polysaccharide-23 12/24/2007  . Tdap 11/13/2010    Screening Tests Health Maintenance  Topic Date Due  . DEXA SCAN  05/02/2000  . COLONOSCOPY  08/09/2012  . FOOT EXAM  11/19/2013  . URINE MICROALBUMIN  11/19/2013  . HEMOGLOBIN A1C  12/05/2013  . INFLUENZA VACCINE  10/24/2014  . ZOSTAVAX  11/13/2018 (Originally 05/03/1995)  . OPHTHALMOLOGY EXAM  07/24/2015  . TETANUS/TDAP  11/12/2020  . PNA vac Low Risk Adult  Completed      Plan:     Plan: Will continue to monitor stress and take time to relax; Family assisting; spouse is 24/7 care Will obtain eye exam from Dr. Velvet Bathe office Will consider dexa scan in the future Declines mammogram; Does self-breast exam  During the course of the visit the patient was educated and counseled about the following appropriate screening and preventive services:   Vaccines to include Pneumoccal, Influenza, Hepatitis B, Td, Zostavax, HCV  Electrocardiogram/ 01/2014  Cardiovascular Disease/ deferred to Dr. Doug Sou  Colorectal cancer screening/ states she does not have to have another  Bone density screening/ to order and will attempt sometime this year  Diabetes screening/ to be done  Glaucoma screening/ Eye Exam Dr. Carolynn Sayers and neg for glaucoma this year  Mammography/PAP/ 2013 and does not want to repeat  Nutrition counseling / n/a  Patient Instructions (the written plan) was given to the patient.   Wynetta Fines, RN  11/11/2014

## 2014-11-11 NOTE — Assessment & Plan Note (Signed)
Checking lipid panel today, currently taking crestor 20 mg daily without side effects. Adjust as needed. Also checking CMP.

## 2014-11-11 NOTE — Assessment & Plan Note (Signed)
Unclear if this is a true diagnosis. Unable to review labs further back than 4 years and no true measurements to classify as diabetes, highest HgA1c is 6.2. Checking today and stopping actos. Suspect she is having hypoglycemia. She is on ARB. Eye exam up to date. Foot exam done today. Not complicated.

## 2014-11-11 NOTE — Assessment & Plan Note (Signed)
Checking CBC as not checked in several years and she is having more fatigue. No signs of blood loss including bleeding, dark stools.

## 2014-11-11 NOTE — Progress Notes (Signed)
Pre visit review using our clinic review tool, if applicable. No additional management support is needed unless otherwise documented below in the visit note. 

## 2014-11-11 NOTE — Progress Notes (Signed)
   Subjective:    Patient ID: [name], female    DOB: Mar 29, 1933, 81 y.o.   MRN: [id number]  HPI The patient is a 81 YO female coming in for follow up of her medical problems: diabetes type 2 (currently on actos, some symptoms concerning for hypoglycemia in the morning, last RKY7C 6.2, not complicated), hypertension (controlled on benicar, not complicated, no side effects), anxiety (doing much better on cymbalta and using valium prn, still dealing with a lot of caregiver stress with her husband's lewy body dementia, no SI/HI and mood stable). She is currently struggling with feeling shaky in the morning and slight headache, relieved by eating. Maybe once or twice a week. Has not checked her sugar during an episode. Breathing is doing well.   Review of Systems  Constitutional: Positive for fatigue. Negative for fever, activity change, appetite change and unexpected weight change.  Respiratory: Negative.   Cardiovascular: Negative.   Gastrointestinal: Negative.   Musculoskeletal: Negative.   Neurological: Positive for weakness.       Shaky episodes some mornings.  Psychiatric/Behavioral: Positive for dysphoric mood. Negative for suicidal ideas, sleep disturbance, self-injury, decreased concentration and agitation. The patient is nervous/anxious.       Objective:   Physical Exam  Constitutional: She is oriented to person, place, and time. She appears well-developed and well-nourished.  HENT:  Head: Normocephalic and atraumatic.  Cardiovascular: Normal rate and regular rhythm.   Pulmonary/Chest: Effort normal and breath sounds normal.  Abdominal: Soft.  Neurological: She is alert and oriented to person, place, and time. Coordination normal.  Skin: Skin is warm and dry.  Psychiatric: She has a normal mood and affect.  Not tearful during the visit.   Filed Vitals:   11/11/14 0956 11/11/14 1030  BP: 160/90 170/88  Pulse: 72   Temp: 97.5 F (36.4 C)   TempSrc: Oral   Resp: 16     Height: 5\' 4"  (1.626 m)   Weight: 150 lb 12 oz (68.38 kg)   SpO2: 90%       Assessment & Plan:

## 2014-11-16 ENCOUNTER — Encounter: Payer: Self-pay | Admitting: Internal Medicine

## 2014-11-16 ENCOUNTER — Other Ambulatory Visit: Payer: Self-pay | Admitting: Internal Medicine

## 2014-11-17 MED ORDER — DIAZEPAM 5 MG PO TABS: 5.0000 mg | ORAL_TABLET | Freq: Two times a day (BID) | ORAL | Status: DC | PRN

## 2014-11-17 NOTE — Telephone Encounter (Signed)
Valium printed and signed.

## 2014-11-20 ENCOUNTER — Encounter: Payer: Self-pay | Admitting: Internal Medicine

## 2015-01-10 DIAGNOSIS — Z23 Encounter for immunization: Secondary | ICD-10-CM | POA: Diagnosis not present

## 2015-01-13 ENCOUNTER — Encounter: Payer: Self-pay | Admitting: Internal Medicine

## 2015-01-24 DIAGNOSIS — M5136 Other intervertebral disc degeneration, lumbar region: Secondary | ICD-10-CM | POA: Diagnosis not present

## 2015-03-09 ENCOUNTER — Ambulatory Visit (INDEPENDENT_AMBULATORY_CARE_PROVIDER_SITE_OTHER): Payer: Medicare Other | Admitting: Pulmonary Disease

## 2015-03-09 ENCOUNTER — Encounter: Payer: Self-pay | Admitting: Pulmonary Disease

## 2015-03-09 VITALS — BP 122/66 | HR 85 | Ht 65.0 in | Wt 156.0 lb

## 2015-03-09 DIAGNOSIS — J302 Other seasonal allergic rhinitis: Secondary | ICD-10-CM | POA: Diagnosis not present

## 2015-03-09 DIAGNOSIS — J4530 Mild persistent asthma, uncomplicated: Secondary | ICD-10-CM

## 2015-03-09 MED ORDER — BUDESONIDE-FORMOTEROL FUMARATE 80-4.5 MCG/ACT IN AERO: 2.0000 | INHALATION_SPRAY | Freq: Two times a day (BID) | RESPIRATORY_TRACT | Status: DC

## 2015-03-09 NOTE — Assessment & Plan Note (Signed)
She continues to have a mild amount of persistent symptoms despite her current medical regimen. She sounds compliant with her medications for allergic rhinitis. She seems to be tolerating the symptoms well so I don't think it's necessary to add a nasal antihistamine at this time. I recommended that she continue the current regimen.

## 2015-03-09 NOTE — Patient Instructions (Signed)
Keep taking your medications as you are doing I will decrease the dose of the Symbicort We will see you back in 6 months or sooner if needed

## 2015-03-09 NOTE — Progress Notes (Signed)
Subjective:    Patient ID: [name], female    DOB: 12-Mar-1934, 81 y.o.   MRN: [id number]   Synopsis: Former patient of Dr. Joya Gaskins who has moderate persistent asthma.  HPI Chief Complaint  Patient presents with  . Follow-up    Former PW pt presents for 1 year rov.  pt c/o sinus congestion, rhinorrhea.    .  [name] is here to establish care with me.  She followed with Dr. Joya Gaskins for years for moderate persistent asthma. She has been struggling lately with taking care of her husband.  He is home bound and requires 24 hour care.  Her family helps and they have an aid. This has been keeping her upset lately.  She continues taking Symbicort.  Her use is fairly consistent, but not strictly 2 puffs bid.  She will sometimes only take one puff or sometimes she will skip days. Her breathing has been OK lately.  No cough or wheeze lately.  She has had a runny nose.  No albuterol recently, she says that she has very rare flare ups.    She says that anxiety is her major problem.  She is not getting out as much as she wants and thinks that this contributes.  She has chronic back pain and is going to see a physician in town on Monday to treat this.  She has received injections but these are of limited value.    Past Medical History  Diagnosis Date  . ARTHRITIS     s/p bilateral THR  . MIGRAINE HEADACHE   . Interstitial cystitis   . ALLERGIC RHINITIS   . ANEMIA, IRON DEFICIENCY   . ANXIETY   . ASTHMA, EXTRINSIC   . HYPERTENSION   . HYPERLIPIDEMIA   . G E R D   . DIABETES MELLITUS, TYPE II   . FIBROMYALGIA     fibromyalgia  . Chronic constipation   . Bronchitis   . High cholesterol   . Cataracts, both eyes       Review of Systems     Objective:   Physical Exam Filed Vitals:   03/09/15 1117  BP: 122/66  Pulse: 85  Height: 5\' 5"  (1.651 m)  Weight: 156 lb (70.761 kg)  SpO2: 96%   RA  Gen: well appearing HENT: OP clear, TM's clear, neck supple PULM: CTA B,  normal percussion CV: RRR, no mgr, trace edema GI: BS+, soft, nontender Derm: no cyanosis or rash Psyche: normal mood and affect        Assessment & Plan:  Allergic rhinitis She continues to have a mild amount of persistent symptoms despite her current medical regimen. She sounds compliant with her medications for allergic rhinitis. She seems to be tolerating the symptoms well so I don't think it's necessary to add a nasal antihistamine at this time. I recommended that she continue the current regimen.  Asthma, mild persistent She has moderate persistent disease and I know personally that she has experienced exacerbations requiring prednisone in the past. However, in terms of her Symbicort use she's anything but consistent. She has not had any sort of albuterol use for several months so I don't think there is a need for her to take the high-dose of Symbicort at this time.  I did educate her today on appropriate use of Symbicort and advised that if there are certain seasons when she has worsening symptoms that she start using the drug on a more consistent basis at least a  month before hand.  Plan: Decrease Symbicort dose to the 80/4.5 dose Continue albuterol as needed Continue Singulair Continue Zyrtec Flu shot is up-to-date Follow-up 6 months, consider backing down on therapy further that time if her disease remains stable     Current outpatient prescriptions:  .  acetaminophen (TYLENOL) 500 MG tablet, Take 1,000 mg by mouth every 6 (six) hours as needed. For pain, Disp: , Rfl:  .  albuterol (PROAIR HFA) 108 (90 BASE) MCG/ACT inhaler, Inhale 2 puffs into the lungs every 6 (six) hours as needed., Disp: 3 Inhaler, Rfl: 3 .  cetirizine (ZYRTEC) 10 MG tablet, Take 10 mg by mouth daily. , Disp: , Rfl:  .  Cholecalciferol (VITAMIN D3) 1000 UNITS CAPS, Take 1 capsule by mouth daily. , Disp: , Rfl:  .  diazepam (VALIUM) 5 MG tablet, Take 1 tablet (5 mg total) by mouth every 12 (twelve)  hours as needed. for anxiety, Disp: 60 tablet, Rfl: 1 .  Docusate Sodium (STOOL SOFTENER) 100 MG capsule, Take 100 mg by mouth daily.  , Disp: , Rfl:  .  DULoxetine (CYMBALTA) 60 MG capsule, Take 1 capsule (60 mg total) by mouth daily., Disp: 90 capsule, Rfl: 3 .  fluticasone (FLONASE) 50 MCG/ACT nasal spray, USE 2 SPRAYS INTO EACH NOSTRIL EVERY DAY, Disp: 48 g, Rfl: 3 .  losartan (COZAAR) 50 MG tablet, Take 1 tablet (50 mg total) by mouth daily., Disp: 90 tablet, Rfl: 3 .  meloxicam (MOBIC) 15 MG tablet, Take 1 tablet (15 mg total) by mouth daily., Disp: 90 tablet, Rfl: 3 .  metaxalone (SKELAXIN) 800 MG tablet, Take 1 tablet (800 mg total) by mouth 3 (three) times daily as needed for muscle spasms., Disp: 90 tablet, Rfl: 0 .  montelukast (SINGULAIR) 10 MG tablet, Take 1 tablet (10 mg total) by mouth at bedtime., Disp: 90 tablet, Rfl: 3 .  nystatin (MYCOSTATIN/NYSTOP) 100000 UNIT/GM POWD, Apply BID prn, Disp: 60 g, Rfl: 0 .  omeprazole (PRILOSEC) 20 MG capsule, Take 1 capsule (20 mg total) by mouth daily., Disp: 90 capsule, Rfl: 3 .  traMADol (ULTRAM) 50 MG tablet, Take 50 mg by mouth every 6 (six) hours as needed. , Disp: , Rfl:  .  triamcinolone cream (KENALOG) 0.1 %, Apply 1 application topically 2 (two) times daily as needed., Disp: , Rfl:  .  valACYclovir (VALTREX) 500 MG tablet, Take 1 tablet (500 mg total) by mouth daily as needed (for break outs)., Disp: 30 tablet, Rfl: 0 .  zolpidem (AMBIEN) 5 MG tablet, Take 1 tablet (5 mg total) by mouth at bedtime as needed for sleep., Disp: 30 tablet, Rfl: 3 .  budesonide-formoterol (SYMBICORT) 80-4.5 MCG/ACT inhaler, Inhale 2 puffs into the lungs 2 (two) times daily., Disp: 1 Inhaler, Rfl: 12

## 2015-03-09 NOTE — Assessment & Plan Note (Signed)
She has moderate persistent disease and I know personally that she has experienced exacerbations requiring prednisone in the past. However, in terms of her Symbicort use she's anything but consistent. She has not had any sort of albuterol use for several months so I don't think there is a need for her to take the high-dose of Symbicort at this time.  I did educate her today on appropriate use of Symbicort and advised that if there are certain seasons when she has worsening symptoms that she start using the drug on a more consistent basis at least a month before hand.  Plan: Decrease Symbicort dose to the 80/4.5 dose Continue albuterol as needed Continue Singulair Continue Zyrtec Flu shot is up-to-date Follow-up 6 months, consider backing down on therapy further that time if her disease remains stable

## 2015-03-13 DIAGNOSIS — M961 Postlaminectomy syndrome, not elsewhere classified: Secondary | ICD-10-CM | POA: Diagnosis not present

## 2015-03-13 DIAGNOSIS — M47816 Spondylosis without myelopathy or radiculopathy, lumbar region: Secondary | ICD-10-CM | POA: Diagnosis not present

## 2015-03-13 DIAGNOSIS — M5136 Other intervertebral disc degeneration, lumbar region: Secondary | ICD-10-CM | POA: Diagnosis not present

## 2015-03-28 DIAGNOSIS — M47816 Spondylosis without myelopathy or radiculopathy, lumbar region: Secondary | ICD-10-CM | POA: Diagnosis not present

## 2015-03-30 ENCOUNTER — Ambulatory Visit (INDEPENDENT_AMBULATORY_CARE_PROVIDER_SITE_OTHER)
Admission: RE | Admit: 2015-03-30 | Discharge: 2015-03-30 | Disposition: A | Discharge status: Home / Self Care | Payer: Medicare Other | Source: Ambulatory Visit | Attending: Internal Medicine | Admitting: Internal Medicine

## 2015-03-30 DIAGNOSIS — Z780 Asymptomatic menopausal state: Secondary | ICD-10-CM | POA: Diagnosis not present

## 2015-04-06 ENCOUNTER — Telehealth: Payer: Self-pay | Admitting: Internal Medicine

## 2015-04-06 NOTE — Telephone Encounter (Signed)
Pt called in and said that she is need a refill on her

## 2015-04-13 ENCOUNTER — Encounter: Payer: Self-pay | Admitting: Internal Medicine

## 2015-04-13 MED ORDER — DIAZEPAM 5 MG PO TABS: 5.0000 mg | ORAL_TABLET | Freq: Two times a day (BID) | ORAL | Status: DC | PRN

## 2015-05-09 DIAGNOSIS — M5136 Other intervertebral disc degeneration, lumbar region: Secondary | ICD-10-CM | POA: Diagnosis not present

## 2015-05-15 ENCOUNTER — Ambulatory Visit: Payer: Medicare Other | Admitting: Internal Medicine

## 2015-05-23 ENCOUNTER — Ambulatory Visit (INDEPENDENT_AMBULATORY_CARE_PROVIDER_SITE_OTHER): Payer: Medicare Other | Admitting: Adult Health

## 2015-05-23 ENCOUNTER — Telehealth: Payer: Self-pay | Admitting: Pulmonary Disease

## 2015-05-23 ENCOUNTER — Encounter: Payer: Self-pay | Admitting: Adult Health

## 2015-05-23 VITALS — BP 142/82 | HR 90 | Temp 97.6°F | Ht 63.0 in | Wt 155.0 lb

## 2015-05-23 DIAGNOSIS — M47816 Spondylosis without myelopathy or radiculopathy, lumbar region: Secondary | ICD-10-CM | POA: Diagnosis not present

## 2015-05-23 DIAGNOSIS — M5136 Other intervertebral disc degeneration, lumbar region: Secondary | ICD-10-CM | POA: Diagnosis not present

## 2015-05-23 DIAGNOSIS — J4531 Mild persistent asthma with (acute) exacerbation: Secondary | ICD-10-CM | POA: Diagnosis not present

## 2015-05-23 DIAGNOSIS — M961 Postlaminectomy syndrome, not elsewhere classified: Secondary | ICD-10-CM | POA: Diagnosis not present

## 2015-05-23 MED ORDER — AZITHROMYCIN 250 MG PO TABS: ORAL_TABLET | ORAL | Status: AC

## 2015-05-23 NOTE — Patient Instructions (Signed)
Zpack to have hold if symptoms worsen with discolored mucus .  Saline nasal rinses As needed   Mucinex DM Twice daily  As needed  Cough/congestion .  Continue on Zyrtec 10mg  At bedtime  As needed  Drainage  follow up Dr. Lake Bells as planned and .As needed   Please contact office for sooner follow up if symptoms do not improve or worsen or seek emergency care

## 2015-05-23 NOTE — Assessment & Plan Note (Signed)
Mild flare with AR/URI  No active wheezing , will hold on steroids for now, informed to call back if not improving will call in pred taper.   Plan  Zpack to have hold if symptoms worsen with discolored mucus .  Saline nasal rinses As needed   Mucinex DM Twice daily  As needed  Cough/congestion .  Continue on Zyrtec 10mg  At bedtime  As needed  Drainage  follow up Dr. Lake Bells as planned and .As needed   Please contact office for sooner follow up if symptoms do not improve or worsen or seek emergency care

## 2015-05-23 NOTE — Progress Notes (Signed)
Subjective:    Patient ID: [name], female    DOB: 1933/03/29, 82 y.o.   MRN: [id number]  HPI 82 yo female with remote smoking with moderate persistent asthma.   05/23/2015 Acute OV  Pt presents for an acute office visit.  Complains of 1 day of  chest tightness/congestion, prod cough with yellow mucus, sinus congestion, SOB and wheezing. . Denies any sinus drainage, fever, nausea or vomiting.  Believes this started after working in her flowers.  Remains on Symbicort, says she is taking. Takes Zyrtec daily.  No recent travel or abx use.  Under a lot of stress with caring for husband at home with dementia-on hospice.       Past Medical History  Diagnosis Date  . ARTHRITIS     s/p bilateral THR  . MIGRAINE HEADACHE   . Interstitial cystitis   . ALLERGIC RHINITIS   . ANEMIA, IRON DEFICIENCY   . ANXIETY   . ASTHMA, EXTRINSIC   . HYPERTENSION   . HYPERLIPIDEMIA   . G E R D   . DIABETES MELLITUS, TYPE II   . FIBROMYALGIA     fibromyalgia  . Chronic constipation   . Bronchitis   . High cholesterol   . Cataracts, both eyes    Current Outpatient Prescriptions on File Prior to Visit  Medication Sig Dispense Refill  . acetaminophen (TYLENOL) 500 MG tablet Take 1,000 mg by mouth every 6 (six) hours as needed. For pain    . albuterol (PROAIR HFA) 108 (90 BASE) MCG/ACT inhaler Inhale 2 puffs into the lungs every 6 (six) hours as needed. 3 Inhaler 3  . budesonide-formoterol (SYMBICORT) 80-4.5 MCG/ACT inhaler Inhale 2 puffs into the lungs 2 (two) times daily. 1 Inhaler 12  . cetirizine (ZYRTEC) 10 MG tablet Take 10 mg by mouth daily.     . Cholecalciferol (VITAMIN D3) 1000 UNITS CAPS Take 1 capsule by mouth daily.     . diazepam (VALIUM) 5 MG tablet Take 1 tablet (5 mg total) by mouth every 12 (twelve) hours as needed. for anxiety 60 tablet 1  . Docusate Sodium (STOOL SOFTENER) 100 MG capsule Take 100 mg by mouth daily.      . DULoxetine (CYMBALTA) 60 MG capsule Take 1  capsule (60 mg total) by mouth daily. 90 capsule 3  . fluticasone (FLONASE) 50 MCG/ACT nasal spray USE 2 SPRAYS INTO EACH NOSTRIL EVERY DAY 48 g 3  . losartan (COZAAR) 50 MG tablet Take 1 tablet (50 mg total) by mouth daily. 90 tablet 3  . meloxicam (MOBIC) 15 MG tablet Take 1 tablet (15 mg total) by mouth daily. 90 tablet 3  . montelukast (SINGULAIR) 10 MG tablet Take 1 tablet (10 mg total) by mouth at bedtime. 90 tablet 3  . nystatin (MYCOSTATIN/NYSTOP) 100000 UNIT/GM POWD Apply BID prn 60 g 0  . omeprazole (PRILOSEC) 20 MG capsule Take 1 capsule (20 mg total) by mouth daily. 90 capsule 3  . triamcinolone cream (KENALOG) 0.1 % Apply 1 application topically 2 (two) times daily as needed.    . valACYclovir (VALTREX) 500 MG tablet Take 1 tablet (500 mg total) by mouth daily as needed (for break outs). 30 tablet 0  . zolpidem (AMBIEN) 5 MG tablet Take 1 tablet (5 mg total) by mouth at bedtime as needed for sleep. 30 tablet 3  . metaxalone (SKELAXIN) 800 MG tablet Take 1 tablet (800 mg total) by mouth 3 (three) times daily as needed for muscle spasms. (Patient  not taking: Reported on 05/23/2015) 90 tablet 0  . traMADol (ULTRAM) 50 MG tablet Take 50 mg by mouth every 6 (six) hours as needed. Reported on 05/23/2015     No current facility-administered medications on file prior to visit.    Review of Systems Constitutional:   No  weight loss, night sweats,  Fevers, chills,  +fatigue, or  lassitude.  HEENT:   No headaches,  Difficulty swallowing,  Tooth/dental problems, or  Sore throat,                No sneezing, itching, ear ache,  +nasal congestion, post nasal drip,   CV:  No chest pain,  Orthopnea, PND, swelling in lower extremities, anasarca, dizziness, palpitations, syncope.   GI  No heartburn, indigestion, abdominal pain, nausea, vomiting, diarrhea, change in bowel habits, loss of appetite, bloody stools.   Resp:  No chest wall deformity  Skin: no rash or lesions.  GU: no dysuria,  change in color of urine, no urgency or frequency.  No flank pain, no hematuria   MS:  No joint pain or swelling.  No decreased range of motion.  No back pain.  Psych:  No change in mood or affect. No depression or anxiety.  No memory loss.         Objective:   Physical Exam  Filed Vitals:   05/23/15 1425  BP: 142/82  Pulse: 90  Temp: 97.6 F (36.4 C)  TempSrc: Oral  Height: 5\' 3"  (1.6 m)  Weight: 155 lb (70.308 kg)  SpO2: 98%   GEN: A/Ox3; pleasant , NAD, elderly   HEENT:  [place]/AT,  EACs-clear, TMs-wnl, NOSE-clear, THROAT-clear drainage, no lesions, no postnasal drip or exudate noted.   NECK:  Supple w/ fair ROM; no JVD; normal carotid impulses w/o bruits; no thyromegaly or nodules palpated; no lymphadenopathy.  RESP  Clear  P & A; w/o, wheezes/ rales/ or rhonchi.no accessory muscle use, no dullness to percussion  CARD:  RRR, no m/r/g  , no peripheral edema, pulses intact, no cyanosis or clubbing.  GI:   Soft & nt; nml bowel sounds; no organomegaly or masses detected.  Musco: Warm bil, no deformities or joint swelling noted.   Neuro: alert, no focal deficits noted.    Skin: Warm, no lesions or rashes         Assessment & Plan:

## 2015-05-23 NOTE — Progress Notes (Signed)
Reviewed, I agree with this plan of care for mild asthma/bronchitis flare up

## 2015-05-23 NOTE — Telephone Encounter (Signed)
Pt being added to TP's schedule today at 2:45.  Nothing further needed.

## 2015-05-30 ENCOUNTER — Ambulatory Visit (INDEPENDENT_AMBULATORY_CARE_PROVIDER_SITE_OTHER): Payer: Medicare Other | Admitting: Internal Medicine

## 2015-05-30 ENCOUNTER — Ambulatory Visit (INDEPENDENT_AMBULATORY_CARE_PROVIDER_SITE_OTHER)
Admission: RE | Admit: 2015-05-30 | Discharge: 2015-05-30 | Disposition: A | Discharge status: Home / Self Care | Payer: Medicare Other | Source: Ambulatory Visit | Attending: Internal Medicine | Admitting: Internal Medicine

## 2015-05-30 ENCOUNTER — Encounter: Payer: Self-pay | Admitting: Internal Medicine

## 2015-05-30 VITALS — BP 142/94 | HR 86 | Temp 97.8°F | Ht 63.0 in | Wt 155.0 lb

## 2015-05-30 DIAGNOSIS — R05 Cough: Secondary | ICD-10-CM | POA: Diagnosis not present

## 2015-05-30 DIAGNOSIS — J4541 Moderate persistent asthma with (acute) exacerbation: Secondary | ICD-10-CM

## 2015-05-30 MED ORDER — PREDNISONE 10 MG PO TABS: ORAL_TABLET | ORAL | Status: DC

## 2015-05-30 NOTE — Progress Notes (Signed)
Subjective:     Patient ID: [name], female   DOB: 1934/03/23, 82 y.o.   MRN: [id number]  PCP Hoyt Koch, MD   HPI  OV 05/30/2015  Chief Complaint  Patient presents with  . Acute Visit    BQ pt. Pt saw TP on 2.28.17 for an acute visit. Pt states her cough has worsened since OV with TP. Pt states the cough now has clear mucus. Pt states overall she feels better but her cough has worsened.    82 year old female with moderate persistent asthma followed by Dr. Lake Bells. Review of the chart indicates that in December 2016 a Symbicort dose was reduced. Then in end of February 2017 saw nurse practitioner for bronchitis and given Z-Pak. She only feels some better but still has significant cough with chest tightness and wheezing and white sputum that is significantly worse than baseline. Therefore she made an acute visit today. She is using albuterol for rescue 3 times daily [baseline is very rare use] although she is not waking up at night. There is no hemoptysis or fever or chills or colored sputum. Symptoms do feel at prior asthma exacerbations. She is worried about this because previous exacerbation was over 3 years ago. In the past prednisone has helped.     has a past medical history of ARTHRITIS; MIGRAINE HEADACHE; Interstitial cystitis; ALLERGIC RHINITIS; ANEMIA, IRON DEFICIENCY; ANXIETY; ASTHMA, EXTRINSIC; HYPERTENSION; HYPERLIPIDEMIA; G E R D; DIABETES MELLITUS, TYPE II; FIBROMYALGIA; Chronic constipation; Bronchitis; High cholesterol; and Cataracts, both eyes.   reports that she quit smoking about 47 years ago. Her smoking use included Cigarettes. She has a 3 pack-year smoking history. She has never used smokeless tobacco.  Past Surgical History  Procedure Laterality Date  . Cataract extraction    . Total hip arthroplasty    . Abdominal hysterectomy    . Ovarian cyst removed    . Lumbar disc surgery    . Colonoscopy    . Esophagogastroduodenoscopy endoscopy    .  Doppler echocardiography    . Nm myoview ltd      stress test    Allergies  Allergen Reactions  . Morphine Sulfate Other (See Comments)    Makes her hyper    Immunization History  Administered Date(s) Administered  . Influenza Split 01/03/2012  . Influenza Whole 12/19/2008, 12/23/2009, 11/13/2010  . Influenza, High Dose Seasonal PF 12/13/2013, 01/10/2015  . Influenza,inj,Quad PF,36+ Mos 11/19/2012  . Influenza-Unspecified 01/10/2015  . Pneumococcal Conjugate-13 01/28/2013  . Pneumococcal Polysaccharide-23 12/24/2007  . Tdap 11/13/2010    Family History  Problem Relation Age of Onset  . Arthritis Mother   . Hypertension Mother   . Heart disease Father   . Arthritis Father   . Hypertension Father   . Depression Sister   . Hyperlipidemia Sister   . Hypertension Other   . Hyperlipidemia Other      Current outpatient prescriptions:  .  acetaminophen (TYLENOL) 500 MG tablet, Take 1,000 mg by mouth every 6 (six) hours as needed. For pain, Disp: , Rfl:  .  albuterol (PROAIR HFA) 108 (90 BASE) MCG/ACT inhaler, Inhale 2 puffs into the lungs every 6 (six) hours as needed., Disp: 3 Inhaler, Rfl: 3 .  budesonide-formoterol (SYMBICORT) 80-4.5 MCG/ACT inhaler, Inhale 2 puffs into the lungs 2 (two) times daily., Disp: 1 Inhaler, Rfl: 12 .  cetirizine (ZYRTEC) 10 MG tablet, Take 10 mg by mouth daily. , Disp: , Rfl:  .  Cholecalciferol (VITAMIN D3) 1000 UNITS CAPS, Take  1 capsule by mouth daily. , Disp: , Rfl:  .  diazepam (VALIUM) 5 MG tablet, Take 1 tablet (5 mg total) by mouth every 12 (twelve) hours as needed. for anxiety, Disp: 60 tablet, Rfl: 1 .  Docusate Sodium (STOOL SOFTENER) 100 MG capsule, Take 100 mg by mouth daily.  , Disp: , Rfl:  .  DULoxetine (CYMBALTA) 60 MG capsule, Take 1 capsule (60 mg total) by mouth daily., Disp: 90 capsule, Rfl: 3 .  fluticasone (FLONASE) 50 MCG/ACT nasal spray, USE 2 SPRAYS INTO EACH NOSTRIL EVERY DAY, Disp: 48 g, Rfl: 3 .  losartan (COZAAR) 50  MG tablet, Take 1 tablet (50 mg total) by mouth daily., Disp: 90 tablet, Rfl: 3 .  meloxicam (MOBIC) 15 MG tablet, Take 1 tablet (15 mg total) by mouth daily., Disp: 90 tablet, Rfl: 3 .  montelukast (SINGULAIR) 10 MG tablet, Take 1 tablet (10 mg total) by mouth at bedtime., Disp: 90 tablet, Rfl: 3 .  nystatin (MYCOSTATIN/NYSTOP) 100000 UNIT/GM POWD, Apply BID prn, Disp: 60 g, Rfl: 0 .  traMADol (ULTRAM) 50 MG tablet, Take 50 mg by mouth every 6 (six) hours as needed. Reported on 05/23/2015, Disp: , Rfl:  .  triamcinolone cream (KENALOG) 0.1 %, Apply 1 application topically 2 (two) times daily as needed., Disp: , Rfl:  .  valACYclovir (VALTREX) 500 MG tablet, Take 1 tablet (500 mg total) by mouth daily as needed (for break outs)., Disp: 30 tablet, Rfl: 0 .  zolpidem (AMBIEN) 5 MG tablet, Take 1 tablet (5 mg total) by mouth at bedtime as needed for sleep., Disp: 30 tablet, Rfl: 3     Review of Systems     Objective:   Physical Exam  Constitutional: She is oriented to person, place, and time. She appears well-developed and well-nourished. No distress.  HENT:  Head: Normocephalic and atraumatic.  Right Ear: External ear normal.  Left Ear: External ear normal.  Mouth/Throat: Oropharynx is clear and moist. No oropharyngeal exudate.  Eyes: Conjunctivae and EOM are normal. Pupils are equal, round, and reactive to light. Right eye exhibits no discharge. Left eye exhibits no discharge. No scleral icterus.  Neck: Normal range of motion. Neck supple. No JVD present. No tracheal deviation present. No thyromegaly present.  Cardiovascular: Normal rate, regular rhythm, normal heart sounds and intact distal pulses.  Exam reveals no gallop and no friction rub.   No murmur heard. Pulmonary/Chest: Effort normal and breath sounds normal. No respiratory distress. She has no wheezes. She has no rales. She exhibits no tenderness.  ? occ wheeze 1 or 2 times and then disappeared  Abdominal: Soft. Bowel sounds are  normal. She exhibits no distension and no mass. There is no tenderness. There is no rebound and no guarding.  Musculoskeletal: Normal range of motion. She exhibits no edema or tenderness.  Lymphadenopathy:    She has no cervical adenopathy.  Neurological: She is alert and oriented to person, place, and time. She has normal reflexes. No cranial nerve deficit. She exhibits normal muscle tone. Coordination normal.  Skin: Skin is warm and dry. No rash noted. She is not diaphoretic. No erythema. No pallor.  Psychiatric: She has a normal mood and affect. Her behavior is normal. Judgment and thought content normal.  Vitals reviewed.   Filed Vitals:   05/30/15 1214  BP: 142/94  Pulse: 86  Temp: 97.8 F (36.6 C)  TempSrc: Oral  Height: 5\' 3"  (1.6 m)  Weight: 155 lb (70.308 kg)  SpO2: 97%  Assessment:       ICD-9-CM ICD-10-CM   1. Asthma, moderate persistent, with acute exacerbation 493.92 J45.41 DG Chest 2 View       Plan:      Do cxr 2 view - will call with results  Please take Take prednisone 40mg  once daily x 3 days, then 30mg  once daily x 3 days, then 20mg  once daily x 3 days, then prednisone 10mg  once daily  x 3 days and stop  Increase symbicort to 160/4.5 , 2puff twice daily  Followup Dr Lake Bells next few weeks or sooner if needed     Dr. Brand Males, M.D., [facility].C.P Pulmonary and Critical Care Medicine Staff Physician [place] [facility] Pager: [phone], If no answer or between  15:00h - 7:00h: call [phone]  05/30/2015 12:34 PM

## 2015-05-30 NOTE — Patient Instructions (Addendum)
ICD-9-CM ICD-10-CM   1. Asthma, moderate persistent, with acute exacerbation 493.92 J45.41     Do cxr 2 view - will call with results  Please take Take prednisone 40mg  once daily x 3 days, then 30mg  once daily x 3 days, then 20mg  once daily x 3 days, then prednisone 10mg  once daily  x 3 days and stop  Increase symbicort to 160/4.5 , 2puff twice daily  Followup Dr Lake Bells next few weeks or sooner if needed

## 2015-06-01 ENCOUNTER — Encounter: Payer: Self-pay | Admitting: Internal Medicine

## 2015-06-01 NOTE — Telephone Encounter (Signed)
Pt is requesting CXR from 05/30/15. Please advise MR thanks

## 2015-06-05 NOTE — Telephone Encounter (Signed)
Patient was informed on 06/02/15 that was normal. Triage pls confirm that is correct  Dr. Brand Males, M.D., [facility].C.P Pulmonary and Critical Care Medicine Staff Physician [place] [facility] Pager: [phone], If no answer or between  15:00h - 7:00h: call [phone]  06/05/2015 5:06 AM

## 2015-06-09 DIAGNOSIS — M47816 Spondylosis without myelopathy or radiculopathy, lumbar region: Secondary | ICD-10-CM | POA: Diagnosis not present

## 2015-06-20 ENCOUNTER — Ambulatory Visit (INDEPENDENT_AMBULATORY_CARE_PROVIDER_SITE_OTHER): Payer: Medicare Other | Admitting: Pulmonary Disease

## 2015-06-20 ENCOUNTER — Other Ambulatory Visit (INDEPENDENT_AMBULATORY_CARE_PROVIDER_SITE_OTHER): Payer: Medicare Other

## 2015-06-20 ENCOUNTER — Encounter: Payer: Self-pay | Admitting: Pulmonary Disease

## 2015-06-20 VITALS — BP 124/72 | HR 109 | Ht 65.0 in | Wt 158.0 lb

## 2015-06-20 DIAGNOSIS — J45909 Unspecified asthma, uncomplicated: Secondary | ICD-10-CM | POA: Diagnosis not present

## 2015-06-20 DIAGNOSIS — J4531 Mild persistent asthma with (acute) exacerbation: Secondary | ICD-10-CM

## 2015-06-20 DIAGNOSIS — R5383 Other fatigue: Secondary | ICD-10-CM

## 2015-06-20 LAB — CBC WITH DIFFERENTIAL/PLATELET
BASOS ABS: 0 10*3/uL (ref 0.0–0.1)
Basophils Relative: 0.6 % (ref 0.0–3.0)
EOS ABS: 0.1 10*3/uL (ref 0.0–0.7)
Eosinophils Relative: 2.1 % (ref 0.0–5.0)
HEMATOCRIT: 39.1 % (ref 36.0–46.0)
Hemoglobin: 12.9 g/dL (ref 12.0–15.0)
LYMPHS PCT: 21.7 % (ref 12.0–46.0)
Lymphs Abs: 1.5 10*3/uL (ref 0.7–4.0)
MCHC: 33 g/dL (ref 30.0–36.0)
MCV: 89.4 fl (ref 78.0–100.0)
Monocytes Absolute: 0.5 10*3/uL (ref 0.1–1.0)
Monocytes Relative: 7.2 % (ref 3.0–12.0)
NEUTROS ABS: 4.7 10*3/uL (ref 1.4–7.7)
NEUTROS PCT: 68.4 % (ref 43.0–77.0)
PLATELETS: 178 10*3/uL (ref 150.0–400.0)
RBC: 4.37 Mil/uL (ref 3.87–5.11)
RDW: 14.8 % (ref 11.5–15.5)
WBC: 6.9 10*3/uL (ref 4.0–10.5)

## 2015-06-20 NOTE — Assessment & Plan Note (Signed)
She has significant fatigue which frankly I think is related to all the stress she is having at home. However, I think it's worth working her up for obstructive sleep apnea if there is no evidence of worsening asthma based on her repeat PFTs and blood work which we have arranged for above.

## 2015-06-20 NOTE — Assessment & Plan Note (Signed)
She has had worsening asthma symptoms in the last 6 months which is unusual for her. She said that the prednisone taper helped her dramatically and today she is no longer wheezing. However, in general she feels more fatigue and dyspnea compared to a year ago at this time. She said about a year ago she had a cardiac workup performed by Dr. Alvester Chou which was normal.  Plan: Workup worsening asthma with serum IgE, CBC with differential, and pulmonary function testing If those are negative then I like to work her up for sleep apnea which could also explain her general fatigue in the setting of weight gain Continue Symbicort 160/4.52 puffs twice a day I will see you back in 6 weeks or sooner if needed

## 2015-06-20 NOTE — Progress Notes (Signed)
Subjective:    Patient ID: [name], female    DOB: 02/24/1934, 82 y.o.   MRN: [id number]   Synopsis: Former patient of Dr. Joya Gaskins who has moderate persistent asthma.  HPI Chief Complaint  Patient presents with  . Follow-up    pt has had 2 acute ov's in the past month-still c/o body weakness.  denies mucus production, fever, cough.     [name] has struggled lately.  She is tired from her recurrent illnesses and all the problems she has taking care of her husband at home.  She says that she is sweating more with exercise of any kind.  Even just dusting will make her have this problem. She runs a vacuum cleaner which will make her more dyspneic and diaphoretic.  She says that this is a marked difference compared to her health a year ago.  She doesn't use the rescue inhaler any more, but she needed it before the prednisone.   She has had two asthma flares in the last two months. This is unusual for her.  She first saw [name] and she took an antibiotic, then she had to see Dr. Chase Caller and needed prednisone.    She sleeps at night a lot but she doesn't awake refreshed.  She notes that she has gained weight.  She isn't sure if she snores or not.    She has never had any heart problems that she is aware of.    No sinus symptoms right now.  However she feels like there is something clogging up the back of her throat because there is a lot of mucus there.   Past Medical History  Diagnosis Date  . ARTHRITIS     s/p bilateral THR  . MIGRAINE HEADACHE   . Interstitial cystitis   . ALLERGIC RHINITIS   . ANEMIA, IRON DEFICIENCY   . ANXIETY   . ASTHMA, EXTRINSIC   . HYPERTENSION   . HYPERLIPIDEMIA   . G E R D   . DIABETES MELLITUS, TYPE II   . FIBROMYALGIA     fibromyalgia  . Chronic constipation   . Bronchitis   . High cholesterol   . Cataracts, both eyes       Review of Systems  Constitutional: Positive for diaphoresis and fatigue. Negative for chills and unexpected weight  change.  HENT: Negative for rhinorrhea, sinus pressure and sneezing.   Respiratory: Positive for shortness of breath. Negative for choking and wheezing.   Cardiovascular: Negative for chest pain, palpitations and leg swelling.       Objective:   Physical Exam Filed Vitals:   06/20/15 1546  BP: 124/72  Pulse: 109  Height: 5\' 5"  (1.651 m)  Weight: 158 lb (71.668 kg)  SpO2: 95%   Walked 500 feet on room air and O2 saturation remained above 95%.  Gen: well appearing HENT: OP clear, TM's clear, neck supple PULM: CTA B, normal percussion CV: RRR, no mgr, trace edema GI: BS+, soft, nontender Derm: no cyanosis or rash Psyche: normal mood and affect  Chest x-ray from March 2017 images personally reviewed showing hyperinflation but no infiltrate Records from my partner reviewed where she was treated for an asthma flare with prednisone.      Assessment & Plan:  Asthma, mild persistent She has had worsening asthma symptoms in the last 6 months which is unusual for her. She said that the prednisone taper helped her dramatically and today she is no longer wheezing. However, in general she feels  more fatigue and dyspnea compared to a year ago at this time. She said about a year ago she had a cardiac workup performed by Dr. Alvester Chou which was normal.  Plan: Workup worsening asthma with serum IgE, CBC with differential, and pulmonary function testing If those are negative then I like to work her up for sleep apnea which could also explain her general fatigue in the setting of weight gain Continue Symbicort 160/4.52 puffs twice a day I will see you back in 6 weeks or sooner if needed  Fatigue She has significant fatigue which frankly I think is related to all the stress she is having at home. However, I think it's worth working her up for obstructive sleep apnea if there is no evidence of worsening asthma based on her repeat PFTs and blood work which we have arranged for above.     Current  outpatient prescriptions:  .  acetaminophen (TYLENOL) 500 MG tablet, Take 1,000 mg by mouth every 6 (six) hours as needed. For pain, Disp: , Rfl:  .  albuterol (PROAIR HFA) 108 (90 BASE) MCG/ACT inhaler, Inhale 2 puffs into the lungs every 6 (six) hours as needed., Disp: 3 Inhaler, Rfl: 3 .  budesonide-formoterol (SYMBICORT) 160-4.5 MCG/ACT inhaler, Inhale 2 puffs into the lungs 2 (two) times daily., Disp: , Rfl:  .  cetirizine (ZYRTEC) 10 MG tablet, Take 10 mg by mouth daily. , Disp: , Rfl:  .  Cholecalciferol (VITAMIN D3) 1000 UNITS CAPS, Take 1 capsule by mouth daily. , Disp: , Rfl:  .  diazepam (VALIUM) 5 MG tablet, Take 1 tablet (5 mg total) by mouth every 12 (twelve) hours as needed. for anxiety, Disp: 60 tablet, Rfl: 1 .  Docusate Sodium (STOOL SOFTENER) 100 MG capsule, Take 100 mg by mouth daily.  , Disp: , Rfl:  .  DULoxetine (CYMBALTA) 60 MG capsule, Take 1 capsule (60 mg total) by mouth daily., Disp: 90 capsule, Rfl: 3 .  fluticasone (FLONASE) 50 MCG/ACT nasal spray, USE 2 SPRAYS INTO EACH NOSTRIL EVERY DAY, Disp: 48 g, Rfl: 3 .  losartan (COZAAR) 50 MG tablet, Take 1 tablet (50 mg total) by mouth daily., Disp: 90 tablet, Rfl: 3 .  meloxicam (MOBIC) 15 MG tablet, Take 1 tablet (15 mg total) by mouth daily., Disp: 90 tablet, Rfl: 3 .  montelukast (SINGULAIR) 10 MG tablet, Take 1 tablet (10 mg total) by mouth at bedtime., Disp: 90 tablet, Rfl: 3 .  nystatin (MYCOSTATIN/NYSTOP) 100000 UNIT/GM POWD, Apply BID prn, Disp: 60 g, Rfl: 0 .  traMADol (ULTRAM) 50 MG tablet, Take 50 mg by mouth every 6 (six) hours as needed. Reported on 05/23/2015, Disp: , Rfl:  .  triamcinolone cream (KENALOG) 0.1 %, Apply 1 application topically 2 (two) times daily as needed., Disp: , Rfl:  .  valACYclovir (VALTREX) 500 MG tablet, Take 1 tablet (500 mg total) by mouth daily as needed (for break outs)., Disp: 30 tablet, Rfl: 0 .  zolpidem (AMBIEN) 5 MG tablet, Take 1 tablet (5 mg total) by mouth at bedtime as needed  for sleep., Disp: 30 tablet, Rfl: 3

## 2015-06-20 NOTE — Patient Instructions (Signed)
We will call you with the results of today's blood work in the upcoming lung function test If those are normal then we will arrange for a home sleep study We will see you back in 6 weeks

## 2015-06-21 LAB — IGE: IGE (IMMUNOGLOBULIN E), SERUM: 3 kU/L (ref <115)

## 2015-06-24 DIAGNOSIS — M5136 Other intervertebral disc degeneration, lumbar region: Secondary | ICD-10-CM | POA: Diagnosis not present

## 2015-06-24 DIAGNOSIS — M47816 Spondylosis without myelopathy or radiculopathy, lumbar region: Secondary | ICD-10-CM | POA: Diagnosis not present

## 2015-06-24 DIAGNOSIS — M961 Postlaminectomy syndrome, not elsewhere classified: Secondary | ICD-10-CM | POA: Diagnosis not present

## 2015-07-21 ENCOUNTER — Encounter: Payer: Self-pay | Admitting: Internal Medicine

## 2015-07-21 DIAGNOSIS — R413 Other amnesia: Secondary | ICD-10-CM

## 2015-07-24 DIAGNOSIS — M5441 Lumbago with sciatica, right side: Secondary | ICD-10-CM | POA: Diagnosis not present

## 2015-07-24 DIAGNOSIS — M9903 Segmental and somatic dysfunction of lumbar region: Secondary | ICD-10-CM | POA: Diagnosis not present

## 2015-07-24 DIAGNOSIS — M9905 Segmental and somatic dysfunction of pelvic region: Secondary | ICD-10-CM | POA: Diagnosis not present

## 2015-07-24 DIAGNOSIS — Q72811 Congenital shortening of right lower limb: Secondary | ICD-10-CM | POA: Diagnosis not present

## 2015-07-24 DIAGNOSIS — M5136 Other intervertebral disc degeneration, lumbar region: Secondary | ICD-10-CM | POA: Diagnosis not present

## 2015-07-24 DIAGNOSIS — M9904 Segmental and somatic dysfunction of sacral region: Secondary | ICD-10-CM | POA: Diagnosis not present

## 2015-07-24 DIAGNOSIS — M25551 Pain in right hip: Secondary | ICD-10-CM | POA: Diagnosis not present

## 2015-07-25 ENCOUNTER — Telehealth: Payer: Self-pay | Admitting: Internal Medicine

## 2015-07-25 ENCOUNTER — Encounter: Payer: Self-pay | Admitting: Internal Medicine

## 2015-07-25 ENCOUNTER — Other Ambulatory Visit (INDEPENDENT_AMBULATORY_CARE_PROVIDER_SITE_OTHER): Payer: Medicare Other

## 2015-07-25 ENCOUNTER — Ambulatory Visit (INDEPENDENT_AMBULATORY_CARE_PROVIDER_SITE_OTHER): Payer: Medicare Other | Admitting: Internal Medicine

## 2015-07-25 ENCOUNTER — Ambulatory Visit: Payer: Medicare Other | Admitting: Family Medicine

## 2015-07-25 VITALS — BP 170/110 | HR 97 | Temp 97.9°F | Resp 18 | Ht 65.0 in | Wt 154.0 lb

## 2015-07-25 DIAGNOSIS — I10 Essential (primary) hypertension: Secondary | ICD-10-CM | POA: Diagnosis not present

## 2015-07-25 LAB — COMPREHENSIVE METABOLIC PANEL
ALBUMIN: 4.7 g/dL (ref 3.5–5.2)
ALK PHOS: 63 U/L (ref 39–117)
ALT: 13 U/L (ref 0–35)
AST: 14 U/L (ref 0–37)
BILIRUBIN TOTAL: 0.4 mg/dL (ref 0.2–1.2)
BUN: 18 mg/dL (ref 6–23)
CHLORIDE: 103 meq/L (ref 96–112)
CO2: 29 meq/L (ref 19–32)
Calcium: 10.1 mg/dL (ref 8.4–10.5)
Creatinine, Ser: 0.8 mg/dL (ref 0.40–1.20)
GFR: 73.31 mL/min (ref >60.00)
GLUCOSE: 108 mg/dL — AB (ref 70–99)
POTASSIUM: 4.4 meq/L (ref 3.5–5.1)
SODIUM: 141 meq/L (ref 135–145)
Total Protein: 7 g/dL (ref 6.0–8.3)

## 2015-07-25 LAB — TSH: TSH: 1.69 u[IU]/mL (ref 0.35–4.50)

## 2015-07-25 MED ORDER — LOSARTAN POTASSIUM 100 MG PO TABS: 100.0000 mg | ORAL_TABLET | Freq: Every day | ORAL | Status: DC

## 2015-07-25 NOTE — Patient Instructions (Addendum)
  Test(s) ordered today. Your results will be released to [place] (or called to you) after review, usually within 72hours after test completion. If any changes need to be made, you will be notified at that same time.  Medications reviewed and updated.  Changes include increasing the losartan to 100 mg daily.    Have the hospice nurse check your blood pressure when she comes.     Your prescription(s) have been submitted to your pharmacy. Please take as directed and contact our office if you believe you are having problem(s) with the medication(s).  Please followup in 1-2 weeks to have your blood pressure rechecked

## 2015-07-25 NOTE — Progress Notes (Signed)
Pre visit review using our clinic review tool, if applicable. No additional management support is needed unless otherwise documented below in the visit note. 

## 2015-07-25 NOTE — Telephone Encounter (Signed)
Patient is requesting to transfer from [place] to [place].  Please advise.

## 2015-07-25 NOTE — Assessment & Plan Note (Signed)
Not controlled Check cmp, tsh Will increase losartan to 100 mg daily She will have her husband's hospice nurse check her blood pressure twice a week and call if her BP is not controlled Will adjust medication as needed Will need to recheck cmp at next visit

## 2015-07-25 NOTE — Telephone Encounter (Signed)
Fine with me

## 2015-07-25 NOTE — Progress Notes (Signed)
Subjective:    Patient ID: [name], female    DOB: 08-08-1933, 82 y.o.   MRN: [id number]  HPI She is here for an acute visit.   Yesterday her blood pressure at the chiropractor was 200/99.  She took it at home yesterday when she got home and it was about the same.   The nurse that comes from her husband took her bp his morning and it was 170/?Marland Kitchen    She has been very tired and has been sleeping a lot.   She has gained weight or the past several months She knows she has been less actiev, but it still seems unusual to have gained this weight.  She has been depressed.  Her husband is on hospice - he has parkinsons with dementia with lewy bodies.    She takes her BP medication nightly.  She does not check her blood pressure at home. Her BP medication was changes last august from benicar to losartan.   She denies chest pain, headaches and leg swelling.   Medications and allergies reviewed with patient and updated if appropriate.  Patient Active Problem List   Diagnosis Date Noted  . Fatigue 06/20/2015  . Memory loss   . Lumbar radiculopathy, chronic   . Herpes simplex labialis 04/03/2012  . Shingles 04/12/2011  . LBBB (left bundle branch block) 11/13/2010  . Diabetes mellitus type 2, uncomplicated ([place]) [id number]  . MIGRAINE HEADACHE 05/30/2010  . Iron deficiency anemia 11/03/2009  . Asthma, mild persistent 12/19/2008  . Hyperlipidemia 08/12/2007  . Anxiety state 08/12/2007  . Essential hypertension 08/12/2007  . Allergic rhinitis 08/12/2007  . G E R D 08/12/2007  . ARTHRITIS 08/12/2007  . FIBROMYALGIA 08/12/2007    Current Outpatient Prescriptions on File Prior to Visit  Medication Sig Dispense Refill  . acetaminophen (TYLENOL) 500 MG tablet Take 1,000 mg by mouth every 6 (six) hours as needed. For pain    . albuterol (PROAIR HFA) 108 (90 BASE) MCG/ACT inhaler Inhale 2 puffs into the lungs every 6 (six) hours as needed. 3 Inhaler 3  . budesonide-formoterol (SYMBICORT)  160-4.5 MCG/ACT inhaler Inhale 2 puffs into the lungs 2 (two) times daily.    . cetirizine (ZYRTEC) 10 MG tablet Take 10 mg by mouth daily.     . Cholecalciferol (VITAMIN D3) 1000 UNITS CAPS Take 1 capsule by mouth daily.     . diazepam (VALIUM) 5 MG tablet Take 1 tablet (5 mg total) by mouth every 12 (twelve) hours as needed. for anxiety 60 tablet 1  . Docusate Sodium (STOOL SOFTENER) 100 MG capsule Take 100 mg by mouth daily.      . DULoxetine (CYMBALTA) 60 MG capsule Take 1 capsule (60 mg total) by mouth daily. 90 capsule 3  . fluticasone (FLONASE) 50 MCG/ACT nasal spray USE 2 SPRAYS INTO EACH NOSTRIL EVERY DAY 48 g 3  . losartan (COZAAR) 50 MG tablet Take 1 tablet (50 mg total) by mouth daily. 90 tablet 3  . meloxicam (MOBIC) 15 MG tablet Take 1 tablet (15 mg total) by mouth daily. 90 tablet 3  . montelukast (SINGULAIR) 10 MG tablet Take 1 tablet (10 mg total) by mouth at bedtime. 90 tablet 3  . nystatin (MYCOSTATIN/NYSTOP) 100000 UNIT/GM POWD Apply BID prn 60 g 0  . traMADol (ULTRAM) 50 MG tablet Take 50 mg by mouth every 6 (six) hours as needed. Reported on 05/23/2015    . triamcinolone cream (KENALOG) 0.1 % Apply 1 application topically 2 (  two) times daily as needed.    . valACYclovir (VALTREX) 500 MG tablet Take 1 tablet (500 mg total) by mouth daily as needed (for break outs). 30 tablet 0  . zolpidem (AMBIEN) 5 MG tablet Take 1 tablet (5 mg total) by mouth at bedtime as needed for sleep. 30 tablet 3   No current facility-administered medications on file prior to visit.    Past Medical History  Diagnosis Date  . ARTHRITIS     s/p bilateral THR  . MIGRAINE HEADACHE   . Interstitial cystitis   . ALLERGIC RHINITIS   . ANEMIA, IRON DEFICIENCY   . ANXIETY   . ASTHMA, EXTRINSIC   . HYPERTENSION   . HYPERLIPIDEMIA   . G E R D   . DIABETES MELLITUS, TYPE II   . FIBROMYALGIA     fibromyalgia  . Chronic constipation   . Bronchitis   . High cholesterol   . Cataracts, both eyes      Past Surgical History  Procedure Laterality Date  . Cataract extraction    . Total hip arthroplasty    . Abdominal hysterectomy    . Ovarian cyst removed    . Lumbar disc surgery    . Colonoscopy    . Esophagogastroduodenoscopy endoscopy    . Doppler echocardiography    . Nm myoview ltd      stress test    Social History   Social History  . Marital Status: Married    Spouse Name: N/A  . Number of Children: N/A  . Years of Education: N/A   Social History Main Topics  . Smoking status: Former Smoker -- 0.30 packs/day for 10 years    Types: Cigarettes    Quit date: 03/25/1968  . 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 Alcohol Use: No  . Drug Use: No  . Sexual Activity: Not Asked   Other Topics Concern  . None   Social History Narrative    Family History  Problem Relation Age of Onset  . Arthritis Mother   . Hypertension Mother   . Heart disease Father   . Arthritis Father   . Hypertension Father   . Depression Sister   . Hyperlipidemia Sister   . Hypertension Other   . Hyperlipidemia Other     Review of Systems  Constitutional: Positive for fatigue.  Respiratory: Positive for shortness of breath (with anxiety).   Cardiovascular: Positive for palpitations (with anxiety). Negative for chest pain and leg swelling.  Neurological: Positive for light-headedness. Negative for headaches (top of head sensitive).  Psychiatric/Behavioral: Positive for dysphoric mood.       Objective:   Filed Vitals:   07/25/15 1513  BP: 170/110  Pulse: 97  Temp: 97.9 F (36.6 C)  Resp: 18   Filed Weights   07/25/15 1513  Weight: 154 lb (69.854 kg)   Body mass index is 25.63 kg/(m^2).   Physical Exam Constitutional: Appears well-developed and well-nourished. No distress.  Neck: Neck supple. No tracheal deviation present. No thyromegaly present.  No carotid bruit. No cervical adenopathy.   Cardiovascular: Normal  rate, regular rhythm and normal heart sounds.   2/6 systolic murmur heard.  No edema Pulmonary/Chest: Effort normal and breath sounds normal. No respiratory distress. No wheezes.        Assessment & Plan:   See Problem List for Assessment and Plan of chronic medical problems.  Follow up in 1-2 weeks to  recheck BP

## 2015-07-26 NOTE — Telephone Encounter (Signed)
Let her know I am not currently accepting new patient transfers

## 2015-07-26 NOTE — Telephone Encounter (Signed)
Notified patient.

## 2015-08-09 ENCOUNTER — Ambulatory Visit (INDEPENDENT_AMBULATORY_CARE_PROVIDER_SITE_OTHER): Payer: Medicare Other | Admitting: Cardiovascular Disease

## 2015-08-09 ENCOUNTER — Encounter: Payer: Self-pay | Admitting: Cardiovascular Disease

## 2015-08-09 VITALS — BP 146/86 | HR 78 | Ht 64.0 in | Wt 153.4 lb

## 2015-08-09 DIAGNOSIS — I447 Left bundle-branch block, unspecified: Secondary | ICD-10-CM | POA: Diagnosis not present

## 2015-08-09 DIAGNOSIS — I10 Essential (primary) hypertension: Secondary | ICD-10-CM | POA: Diagnosis not present

## 2015-08-09 DIAGNOSIS — E785 Hyperlipidemia, unspecified: Secondary | ICD-10-CM | POA: Diagnosis not present

## 2015-08-09 MED ORDER — AMLODIPINE BESYLATE 5 MG PO TABS: 5.0000 mg | ORAL_TABLET | Freq: Every day | ORAL | Status: DC

## 2015-08-09 NOTE — Patient Instructions (Signed)
Medication Instructions:  Your physician has recommended you make the following change in your medication:  1- START amlodipine 5 mg (1 TABLET) by mouth daily.   Labwork: none  Testing/Procedures: none  Follow-Up: Your physician wants you to follow-up in: [place]. You will receive a reminder letter in the mail two months in advance. If you don't receive a letter, please call our office to schedule the follow-up appointment.  Your physician recommends that you schedule a follow-up appointment in 4 weeks with the Pharmacist for blood pressure check. PLEASE BRING A LOG OF YOUR BLOOD PRESSURES FROM THE LAST MONTH AND YOUR BP CUFF WITH YOU TO THIS APPOINTMENT.    Any Other Special Instructions Will Be Listed Below (If Applicable).  Please keep a log of your blood pressure for the next month.     If you need a refill on your cardiac medications before your next appointment, please call your pharmacy.

## 2015-08-09 NOTE — Assessment & Plan Note (Signed)
chronic

## 2015-08-09 NOTE — Progress Notes (Signed)
08/09/2015 [name]   [birth year]  [id number]  Primary Physician Sonya Koch, MD Primary Cardiologist: [name]   HPI:  Mrs. [name] is an 82 year old mildly-overweight married Caucasian female, mother of 3 and grandmother of 1 grandchild, who works as a Acupuncturist. Her husband [name] was a patient of mine as well unfortunately now has [name] and is on home hospice.Her risk factors include type 2 diabetes, hypertension, and hyperlipidemia. She does have fibromyalgia. She has had a normal 2D echo and Myoview stress test in the past. She is otherwise asymptomatic. Since I saw her a year and a half ago she's remained stable. She is somewhat emotional and anxious given her home situation Blood pressure has been running significantly higher at homeas well. She denies chest pain or shortness of breath.     Current Outpatient Prescriptions  Medication Sig Dispense Refill  . acetaminophen (TYLENOL) 500 MG tablet Take 1,000 mg by mouth every 6 (six) hours as needed. For pain    . albuterol (PROAIR HFA) 108 (90 BASE) MCG/ACT inhaler Inhale 2 puffs into the lungs every 6 (six) hours as needed. 3 Inhaler 3  . budesonide-formoterol (SYMBICORT) 160-4.5 MCG/ACT inhaler Inhale 2 puffs into the lungs 2 (two) times daily.    . cetirizine (ZYRTEC) 10 MG tablet Take 10 mg by mouth daily.     . Cholecalciferol (VITAMIN D3) 1000 UNITS CAPS Take 1 capsule by mouth daily.     . diazepam (VALIUM) 5 MG tablet Take 1 tablet (5 mg total) by mouth every 12 (twelve) hours as needed. for anxiety 60 tablet 1  . Docusate Sodium (STOOL SOFTENER) 100 MG capsule Take 100 mg by mouth daily.      . DULoxetine (CYMBALTA) 60 MG capsule Take 1 capsule (60 mg total) by mouth daily. 90 capsule 3  . fluticasone (FLONASE) 50 MCG/ACT nasal spray USE 2 SPRAYS INTO EACH NOSTRIL EVERY DAY 48 g 3  . losartan (COZAAR) 100 MG tablet Take 1 tablet (100 mg  total) by mouth daily. 90 tablet 3  . meloxicam (MOBIC) 15 MG tablet Take 1 tablet (15 mg total) by mouth daily. 90 tablet 3  . montelukast (SINGULAIR) 10 MG tablet Take 1 tablet (10 mg total) by mouth at bedtime. 90 tablet 3  . nystatin (MYCOSTATIN/NYSTOP) 100000 UNIT/GM POWD Apply BID prn 60 g 0  . traMADol (ULTRAM) 50 MG tablet Take 50 mg by mouth every 6 (six) hours as needed. Reported on 05/23/2015    . triamcinolone cream (KENALOG) 0.1 % Apply 1 application topically 2 (two) times daily as needed.    . valACYclovir (VALTREX) 500 MG tablet Take 1 tablet (500 mg total) by mouth daily as needed (for break outs). 30 tablet 0  . zolpidem (AMBIEN) 5 MG tablet Take 1 tablet (5 mg total) by mouth at bedtime as needed for sleep. 30 tablet 3  . amLODipine (NORVASC) 5 MG tablet Take 1 tablet (5 mg total) by mouth daily. 90 tablet 3   No current facility-administered medications for this visit.    Allergies  Allergen Reactions  . Morphine Sulfate Other (See Comments)    Makes her hyper    Social History   Social History  . Marital Status: Married    Spouse Name: N/A  . Number of Children: N/A  . Years of Education: N/A   Occupational History  . Not on file.   Social History Main Topics  .  Smoking status: Former Smoker -- 0.30 packs/day for 10 years    Types: Cigarettes    Quit date: 03/25/1968  . 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 Alcohol Use: No  . Drug Use: No  . Sexual Activity: Not on file   Other Topics Concern  . Not on file   Social History Narrative     Review of Systems: General: negative for chills, fever, night sweats or weight changes.  Cardiovascular: negative for chest pain, dyspnea on exertion, edema, orthopnea, palpitations, paroxysmal nocturnal dyspnea or shortness of breath Dermatological: negative for rash Respiratory: negative for cough or wheezing Urologic: negative for hematuria Abdominal:  negative for nausea, vomiting, diarrhea, bright red blood per rectum, melena, or hematemesis Neurologic: negative for visual changes, syncope, or dizziness All other systems reviewed and are otherwise negative except as noted above.    Blood pressure 146/86, pulse 78, height 5\' 4"  (1.626 m), weight 153 lb 6.4 oz (69.582 kg).  General appearance: alert and no distress Neck: no adenopathy, no carotid bruit, no JVD, supple, symmetrical, trachea midline and thyroid not enlarged, symmetric, no tenderness/mass/nodules Lungs: clear to auscultation bilaterally Heart: regular rate and rhythm, S1, S2 normal, no murmur, click, rub or gallop Extremities: extremities normal, atraumatic, no cyanosis or edema  EKG normal sinus rhythm at 78 with left bundle branch block. I personally reviewed this EKG  ASSESSMENT AND PLAN:   Hyperlipidemia History of hyperlipidemia not on statin therapy with recent lipid profile performed 11/11/14 revealing total cholesterol 208, LDL 116 and HDL of 70.  Essential hypertension History of hypertension with blood pressure measures 146/86. She says that at home her blood pressure has run significantly higher than this. She is on losartan. On 9 at 5 mg of amlodipine. She will keep the blood pressure log daily for a month and see [name] after that for further evaluation.  LBBB (left bundle branch block) chronic      [name] MD [facility], [facility] 08/09/2015 4:27 PM

## 2015-08-09 NOTE — Assessment & Plan Note (Signed)
History of hyperlipidemia not on statin therapy with recent lipid profile performed 11/11/14 revealing total cholesterol 208, LDL 116 and HDL of 70.

## 2015-08-09 NOTE — Assessment & Plan Note (Signed)
History of hypertension with blood pressure measures 146/86. She says that at home her blood pressure has run significantly higher than this. She is on losartan. On 9 at 5 mg of amlodipine. She will keep the blood pressure log daily for a month and see [name] after that for further evaluation.

## 2015-08-10 ENCOUNTER — Encounter: Payer: Self-pay | Admitting: Internal Medicine

## 2015-08-10 MED ORDER — DIAZEPAM 5 MG PO TABS: 5.0000 mg | ORAL_TABLET | Freq: Two times a day (BID) | ORAL | Status: DC | PRN

## 2015-08-10 NOTE — Telephone Encounter (Signed)
Received call pt states she had sent email [facility] sent request to have her Diazepam refill on Monday but haven't had response. Inform pt since Dr. Sharlet Salina is out of the office will forward to another MD for approval & ill give hear call bck once med has been approve. Is it ok to renew her diazepam..../lmb

## 2015-08-10 NOTE — Telephone Encounter (Signed)
NOTIFIED PT RX FAXED TO [facility]

## 2015-08-10 NOTE — Telephone Encounter (Signed)
rx printed

## 2015-08-14 ENCOUNTER — Telehealth: Payer: Self-pay | Admitting: Cardiovascular Disease

## 2015-08-14 NOTE — Telephone Encounter (Signed)
Pt got new rx for amlodipine last Wednesday.  She is not sure when to take and if should take with other bp meds.

## 2015-08-14 NOTE — Telephone Encounter (Signed)
Recommendations reviewed w/ patient. She notes she started taking Bp log about 4 days ago and started amlodipine on 5/20. She notes BPs around 140-150/70-80  Advised to continue amlodipine and daily Bp checks - she probably will not notice a BP lowering effect until on med x several days. Pt voiced understanding and thanks.  She confirmed BP visit appt w/ pharmD. No further questions at present.

## 2015-08-16 ENCOUNTER — Telehealth: Payer: Self-pay

## 2015-08-16 NOTE — Telephone Encounter (Signed)
Patient called and said that her pharmacy had yet to get diazepam refill request. Could you please fax it again. Thank you.

## 2015-08-16 NOTE — Telephone Encounter (Signed)
Called [facility] verified if they received script that was faxed on 5/18, per elizabeth they did not receive. Gave verbal authorization from 5/18 to fill. Notified pt refill has been called in...[name]

## 2015-08-18 ENCOUNTER — Ambulatory Visit (INDEPENDENT_AMBULATORY_CARE_PROVIDER_SITE_OTHER): Payer: Medicare Other | Admitting: Pulmonary Disease

## 2015-08-18 ENCOUNTER — Other Ambulatory Visit (INDEPENDENT_AMBULATORY_CARE_PROVIDER_SITE_OTHER): Payer: Medicare Other

## 2015-08-18 ENCOUNTER — Encounter: Payer: Self-pay | Admitting: Pulmonary Disease

## 2015-08-18 VITALS — BP 128/72 | HR 80 | Ht 63.0 in | Wt 152.0 lb

## 2015-08-18 DIAGNOSIS — J4531 Mild persistent asthma with (acute) exacerbation: Secondary | ICD-10-CM | POA: Diagnosis not present

## 2015-08-18 DIAGNOSIS — R300 Dysuria: Secondary | ICD-10-CM

## 2015-08-18 DIAGNOSIS — J45909 Unspecified asthma, uncomplicated: Secondary | ICD-10-CM

## 2015-08-18 HISTORY — DX: Dysuria: R30.0

## 2015-08-18 LAB — PULMONARY FUNCTION TEST
DL/VA % pred: 88 %
DL/VA: 4.12 ml/min/mmHg/L
DLCO COR % PRED: 74 %
DLCO UNC: 17.47 ml/min/mmHg
DLCO cor: 17.02 ml/min/mmHg
DLCO unc % pred: 76 %
FEF 25-75 POST: 2.41 L/s
FEF 25-75 Pre: 2.36 L/sec
FEF2575-%Change-Post: 1 %
FEF2575-%PRED-POST: 179 %
FEF2575-%Pred-Pre: 176 %
FEV1-%CHANGE-POST: 2 %
FEV1-%PRED-POST: 113 %
FEV1-%Pred-Pre: 111 %
FEV1-PRE: 2.05 L
FEV1-Post: 2.1 L
FEV1FVC-%Change-Post: 1 %
FEV1FVC-%PRED-PRE: 113 %
FEV6-%Change-Post: 0 %
FEV6-%PRED-POST: 104 %
FEV6-%Pred-Pre: 103 %
FEV6-POST: 2.45 L
FEV6-PRE: 2.43 L
FEV6FVC-%CHANGE-POST: 0 %
FEV6FVC-%PRED-POST: 106 %
FEV6FVC-%PRED-PRE: 106 %
FVC-%Change-Post: 0 %
FVC-%PRED-POST: 98 %
FVC-%PRED-PRE: 98 %
FVC-POST: 2.45 L
FVC-PRE: 2.43 L
POST FEV1/FVC RATIO: 86 %
PRE FEV6/FVC RATIO: 100 %
Post FEV6/FVC ratio: 100 %
Pre FEV1/FVC ratio: 84 %
RV % PRED: 92 %
RV: 2.16 L
TLC % pred: 93 %
TLC: 4.57 L

## 2015-08-18 LAB — URINALYSIS, ROUTINE W REFLEX MICROSCOPIC
BILIRUBIN URINE: NEGATIVE
HGB URINE DIPSTICK: NEGATIVE
Ketones, ur: NEGATIVE
Nitrite: POSITIVE — AB
Specific Gravity, Urine: 1.02 (ref 1.000–1.030)
UROBILINOGEN UA: 1 (ref 0.0–1.0)
Urine Glucose: NEGATIVE
pH: 6 (ref 5.0–8.0)

## 2015-08-18 MED ORDER — CIPROFLOXACIN HCL 250 MG PO TABS: 250.0000 mg | ORAL_TABLET | Freq: Two times a day (BID) | ORAL | Status: DC

## 2015-08-18 MED ORDER — BUDESONIDE-FORMOTEROL FUMARATE 80-4.5 MCG/ACT IN AERO: 2.0000 | INHALATION_SPRAY | Freq: Two times a day (BID) | RESPIRATORY_TRACT | Status: DC

## 2015-08-18 NOTE — Progress Notes (Signed)
Subjective:    Patient ID: [name], female    DOB: 1933-12-29, 82 y.o.   MRN: [id number]   Synopsis: Former patient of Dr. Joya Gaskins who has moderate persistent asthma. March 2017 eosinophil count 100 cells per microliter, IgE 26 Jul 2015 pulmonary function testing ratio 86%, FEV1 2.10 L (113% predicted), FVC 2.45 (98% predicted), no change in FEV1 with bronchodilator, total lung capacity 4.57 L (93% predicted). DLCO 17.47 (76% predicted).   HPI Chief Complaint  Patient presents with  . Follow-up    review PFT.  pt states she's doing well, states she has a lot of difficulties at home and does not take notice of her own breathing/health status.     [name] says that she has been struggling with her husband's illness.  She is the primary caretaker but she has some help during the daytime and he is now on hospice. She reports an occasional UTI (including right now). She notes several days of dysuria. She is dealing with chronic back problems. No albuterol use.   Past Medical History  Diagnosis Date  . ARTHRITIS     s/p bilateral THR  . MIGRAINE HEADACHE   . Interstitial cystitis   . ALLERGIC RHINITIS   . ANEMIA, IRON DEFICIENCY   . ANXIETY   . ASTHMA, EXTRINSIC   . HYPERTENSION   . HYPERLIPIDEMIA   . G E R D   . DIABETES MELLITUS, TYPE II   . FIBROMYALGIA     fibromyalgia  . Chronic constipation   . Bronchitis   . High cholesterol   . Cataracts, both eyes       Review of Systems  Constitutional: Positive for diaphoresis and fatigue. Negative for chills and unexpected weight change.  HENT: Negative for rhinorrhea, sinus pressure and sneezing.   Respiratory: Positive for shortness of breath. Negative for choking and wheezing.   Cardiovascular: Negative for chest pain, palpitations and leg swelling.       Objective:   Physical Exam Filed Vitals:   08/18/15 1008  BP: 128/72  Pulse: 80  Height: 5\' 3"  (1.6 m)  Weight: 152 lb (68.947 kg)  SpO2: 98%     Gen:  well appearing HENT: OP clear, TM's clear, neck supple PULM: CTA B, normal percussion CV: RRR, no mgr, trace edema GI: BS+, soft, nontender Derm: no cyanosis or rash Psyche: normal mood and affect  March 2017 eosinophil count 100 cells per microliter, IgE 26 Jul 2015 pulmonary function testing ratio 86%, FEV1 2.10 L (113% predicted), FVC 2.45 (98% predicted), no change in FEV1 with bronchodilator, total lung capacity 4.57 L (93% predicted). DLCO 17.47 (76% predicted).      Assessment & Plan:  Dysuria She reports several days of dysuria consistent with her prior episodes of UTI.  Plan: U/A and culture Cipro x3 days  Asthma, mild persistent This has been a stable interval for her. Her blood work did not show evidence of an elevated serum IgE or eosinophils. Her ulnar function testing today did not show evidence of airflow obstruction. However, I have seen her when she is having an acute asthma attack where she has significant wheezing so I think it's very reasonable to continue controller medicines.  Plan: Arguably, I could reduce her to an inhaled corticosteroid alone but because of the severity of symptoms she's had in the past and all the stress she is under right now from her husband being on hospice and just going to slightly decrease the Symbicort 80/4.5. If  she stable on the next visit then we will consider dropping to Qvar Follow-up 6 months     Current outpatient prescriptions:  .  acetaminophen (TYLENOL) 500 MG tablet, Take 1,000 mg by mouth every 6 (six) hours as needed. For pain, Disp: , Rfl:  .  albuterol (PROAIR HFA) 108 (90 BASE) MCG/ACT inhaler, Inhale 2 puffs into the lungs every 6 (six) hours as needed., Disp: 3 Inhaler, Rfl: 3 .  amLODipine (NORVASC) 5 MG tablet, Take 1 tablet (5 mg total) by mouth daily., Disp: 90 tablet, Rfl: 3 .  cetirizine (ZYRTEC) 10 MG tablet, Take 10 mg by mouth daily. , Disp: , Rfl:  .  Cholecalciferol (VITAMIN D3) 1000 UNITS CAPS, Take 1  capsule by mouth daily. , Disp: , Rfl:  .  diazepam (VALIUM) 5 MG tablet, Take 1 tablet (5 mg total) by mouth every 12 (twelve) hours as needed. for anxiety, Disp: 60 tablet, Rfl: 1 .  Docusate Sodium (STOOL SOFTENER) 100 MG capsule, Take 100 mg by mouth daily.  , Disp: , Rfl:  .  DULoxetine (CYMBALTA) 60 MG capsule, Take 1 capsule (60 mg total) by mouth daily., Disp: 90 capsule, Rfl: 3 .  fluticasone (FLONASE) 50 MCG/ACT nasal spray, USE 2 SPRAYS INTO EACH NOSTRIL EVERY DAY, Disp: 48 g, Rfl: 3 .  losartan (COZAAR) 100 MG tablet, Take 1 tablet (100 mg total) by mouth daily., Disp: 90 tablet, Rfl: 3 .  meloxicam (MOBIC) 15 MG tablet, Take 1 tablet (15 mg total) by mouth daily., Disp: 90 tablet, Rfl: 3 .  montelukast (SINGULAIR) 10 MG tablet, Take 1 tablet (10 mg total) by mouth at bedtime., Disp: 90 tablet, Rfl: 3 .  nystatin (MYCOSTATIN/NYSTOP) 100000 UNIT/GM POWD, Apply BID prn, Disp: 60 g, Rfl: 0 .  traMADol (ULTRAM) 50 MG tablet, Take 50 mg by mouth every 6 (six) hours as needed. Reported on 05/23/2015, Disp: , Rfl:  .  triamcinolone cream (KENALOG) 0.1 %, Apply 1 application topically 2 (two) times daily as needed., Disp: , Rfl:  .  valACYclovir (VALTREX) 500 MG tablet, Take 1 tablet (500 mg total) by mouth daily as needed (for break outs)., Disp: 30 tablet, Rfl: 0 .  zolpidem (AMBIEN) 5 MG tablet, Take 1 tablet (5 mg total) by mouth at bedtime as needed for sleep., Disp: 30 tablet, Rfl: 3 .  budesonide-formoterol (SYMBICORT) 80-4.5 MCG/ACT inhaler, Inhale 2 puffs into the lungs 2 (two) times daily., Disp: 1 Inhaler, Rfl: 12 .  ciprofloxacin (CIPRO) 250 MG tablet, Take 1 tablet (250 mg total) by mouth 2 (two) times daily., Disp: 6 tablet, Rfl: 0

## 2015-08-18 NOTE — Assessment & Plan Note (Signed)
She reports several days of dysuria consistent with her prior episodes of UTI.  Plan: U/A and culture Cipro x3 days

## 2015-08-18 NOTE — Patient Instructions (Signed)
Take the Cipro as prescribed Use the new dose of Symbicort 2 puffs twice a day after you have used up your current supply of Symbicort. I will see you back in 6 months or sooner if needed

## 2015-08-18 NOTE — Assessment & Plan Note (Signed)
This has been a stable interval for her. Her blood work did not show evidence of an elevated serum IgE or eosinophils. Her ulnar function testing today did not show evidence of airflow obstruction. However, I have seen her when she is having an acute asthma attack where she has significant wheezing so I think it's very reasonable to continue controller medicines.  Plan: Arguably, I could reduce her to an inhaled corticosteroid alone but because of the severity of symptoms she's had in the past and all the stress she is under right now from her husband being on hospice and just going to slightly decrease the Symbicort 80/4.5. If she stable on the next visit then we will consider dropping to Qvar Follow-up 6 months

## 2015-08-18 NOTE — Progress Notes (Signed)
PFT done today. 

## 2015-08-20 LAB — URINE CULTURE
COLONY COUNT: NO GROWTH
ORGANISM ID, BACTERIA: NO GROWTH

## 2015-08-25 ENCOUNTER — Encounter: Payer: Self-pay | Admitting: Neurology

## 2015-08-25 ENCOUNTER — Other Ambulatory Visit (INDEPENDENT_AMBULATORY_CARE_PROVIDER_SITE_OTHER): Payer: Medicare Other

## 2015-08-25 ENCOUNTER — Encounter: Payer: Self-pay | Admitting: Internal Medicine

## 2015-08-25 ENCOUNTER — Ambulatory Visit (INDEPENDENT_AMBULATORY_CARE_PROVIDER_SITE_OTHER): Payer: Medicare Other | Admitting: Neurology

## 2015-08-25 ENCOUNTER — Ambulatory Visit: Payer: Medicare Other | Admitting: Internal Medicine

## 2015-08-25 VITALS — BP 150/90 | HR 102 | Ht 63.0 in | Wt 151.6 lb

## 2015-08-25 DIAGNOSIS — G3184 Mild cognitive impairment, so stated: Secondary | ICD-10-CM

## 2015-08-25 DIAGNOSIS — F419 Anxiety disorder, unspecified: Secondary | ICD-10-CM | POA: Diagnosis not present

## 2015-08-25 LAB — VITAMIN B12: Vitamin B-12: 188 pg/mL — ABNORMAL LOW (ref 211–911)

## 2015-08-25 NOTE — Progress Notes (Signed)
[facility] Clinic Note - Initial Visit   Date: 08/25/2015  LURLEAN BATTISTELLI MRN: [id number] DOB: [birth year]   Dear Dr. Hoyt Koch, MD:  Thank you for your kind referral of [name] for consultation of memory changes. Although her history is well known to you, please allow [name] to reiterate it for the purpose of our medical record. The patient was accompanied to the clinic by self.   History of Present Illness: AVANNA RUESCH is a 82 y.o. right-handed Caucasian female with migraine, anemia, hypertension, asthma, anxiety, hyperlipidemia, diabetes mellitus, fibromylagia presenting for evaluation of memory changes.    She is concerned about dementia because her father and sister had dementia.  Her father developed Alzhiemer's at the age of 55 and died at 32.  Her sister is 66 and has severe dementia for the past 5 years.  Mrs. Stordahl denies any current memory problems, she sometimes she has word-finding difficulty and remembering names.  She endorses difficulty with concentration, especially with reading.  She can do her own household chores, manages finances, and denies any problems with driving.  She manages her own medications.  She plays bridge every other weekend.  She often misplaces items, but says this is an old habit.  Sleep is good, she gets about 7 hours per night. She endorses significant changes in her life after her husband was diagnosed with advanced Lwey Body Dementia.  He is at home with home hospice.  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  She endorses significant anxiety and takes Cymbalta 60mg  as well as valium 5mg  as needed for severe anxiety/panic attacks.   She also complains of bilateral hip and low back pain for which she sees Dr. Nelva Bush.  They have recommended to implant a implantable unit, but she would like to explore other noninvasive options.    Out-side paper records, electronic medical record, and  images have been reviewed where available and summarized as:  Lab Results  Component Value Date   TSH 1.69 07/25/2015   Lab Results  Component Value Date   [id number] 10/23/2009   Lab Results  Component Value Date   HGBA1C 5.9 11/11/2014     Past Medical History  Diagnosis Date  . ARTHRITIS     s/p bilateral THR  . MIGRAINE HEADACHE   . Interstitial cystitis   . ALLERGIC RHINITIS   . ANEMIA, IRON DEFICIENCY   . ANXIETY   . ASTHMA, EXTRINSIC   . HYPERTENSION   . HYPERLIPIDEMIA   . G E R D   . DIABETES MELLITUS, TYPE II   . FIBROMYALGIA     fibromyalgia  . Chronic constipation   . Bronchitis   . High cholesterol   . Cataracts, both eyes     Past Surgical History  Procedure Laterality Date  . Cataract extraction    . Total hip arthroplasty    . Abdominal hysterectomy    . Ovarian cyst removed    . Lumbar disc surgery    . Colonoscopy    . Esophagogastroduodenoscopy endoscopy    . Doppler echocardiography    . Nm myoview ltd      stress test     Medications:  Outpatient Encounter Prescriptions as of 08/25/2015  Medication Sig  . acetaminophen (TYLENOL) 500 MG tablet Take 1,000 mg by mouth every 6 (six) hours as needed. For pain  . albuterol (PROAIR HFA) 108 (90 BASE) MCG/ACT inhaler Inhale 2 puffs into  the lungs every 6 (six) hours as needed.  Marland Kitchen amLODipine (NORVASC) 5 MG tablet Take 1 tablet (5 mg total) by mouth daily.  . budesonide-formoterol (SYMBICORT) 80-4.5 MCG/ACT inhaler Inhale 2 puffs into the lungs 2 (two) times daily.  . cetirizine (ZYRTEC) 10 MG tablet Take 10 mg by mouth daily.   . Cholecalciferol (VITAMIN D3) 1000 UNITS CAPS Take 1 capsule by mouth daily.   . diazepam (VALIUM) 5 MG tablet Take 1 tablet (5 mg total) by mouth every 12 (twelve) hours as needed. for anxiety  . Docusate Sodium (STOOL SOFTENER) 100 MG capsule Take 100 mg by mouth daily.    . DULoxetine (CYMBALTA) 60 MG capsule Take 1 capsule (60 mg total) by mouth daily.  .  fluticasone (FLONASE) 50 MCG/ACT nasal spray USE 2 SPRAYS INTO EACH NOSTRIL EVERY DAY  . losartan (COZAAR) 100 MG tablet Take 1 tablet (100 mg total) by mouth daily.  . meloxicam (MOBIC) 15 MG tablet Take 1 tablet (15 mg total) by mouth daily.  . montelukast (SINGULAIR) 10 MG tablet Take 1 tablet (10 mg total) by mouth at bedtime.  Marland Kitchen nystatin (MYCOSTATIN/NYSTOP) 100000 UNIT/GM POWD Apply BID prn  . traMADol (ULTRAM) 50 MG tablet Take 50 mg by mouth every 6 (six) hours as needed. Reported on 05/23/2015  . triamcinolone cream (KENALOG) 0.1 % Apply 1 application topically 2 (two) times daily as needed.  . valACYclovir (VALTREX) 500 MG tablet Take 1 tablet (500 mg total) by mouth daily as needed (for break outs).  . zolpidem (AMBIEN) 5 MG tablet Take 1 tablet (5 mg total) by mouth at bedtime as needed for sleep.  . [DISCONTINUED] ciprofloxacin (CIPRO) 250 MG tablet Take 1 tablet (250 mg total) by mouth 2 (two) times daily.   No facility-administered encounter medications on file as of 08/25/2015.     Allergies:  Allergies  Allergen Reactions  . Morphine Sulfate Other (See Comments)    Makes her hyper    Family History: Family History  Problem Relation Age of Onset  . Arthritis Mother   . Hypertension Mother   . Heart disease Father   . Arthritis Father   . Hypertension Father   . Depression Sister   . Hyperlipidemia Sister   . Hypertension Other   . Hyperlipidemia Other     Social History: Social History  Substance Use Topics  . Smoking status: Former Smoker -- 0.30 packs/day for 10 years    Types: Cigarettes    Quit date: 03/25/1968  . 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 Alcohol Use: No   Social History   Social History Narrative   Lives with husband in a 3 story home.  Has 2 children.  Retired Glass blower/designer.  Education: Scientist, water quality.    Review of Systems:  CONSTITUTIONAL: No fevers, chills, night sweats, or  weight loss.   EYES: No visual changes or eye pain ENT: No hearing changes.  No history of nose bleeds.   RESPIRATORY: No cough, wheezing and shortness of breath.   CARDIOVASCULAR: Negative for chest pain, and palpitations.   GI: Negative for abdominal discomfort, blood in stools or black stools.  No recent change in bowel habits.   GU:  No history of incontinence.   MUSCLOSKELETAL: No history of joint pain or swelling.  No myalgias.   SKIN: Negative for lesions, rash, and itching.   HEMATOLOGY/ONCOLOGY: Negative for prolonged bleeding, bruising easily, and swollen nodes.  No history of cancer.   ENDOCRINE: Negative for cold or heat intolerance, polydipsia or goiter.   PSYCH:  No depression or anxiety symptoms.   NEURO: As Above.   Vital Signs:  BP 150/90 mmHg  Pulse 102  Ht 5\' 3"  (1.6 m)  Wt 151 lb 9 oz (68.748 kg)  BMI 26.85 kg/m2  SpO2 93%   General Medical Exam:   General:  Well appearing, comfortable.   Eyes/ENT: see cranial nerve examination.   Neck: No masses appreciated.  Full range of motion without tenderness.  No carotid bruits. Respiratory:  Clear to auscultation, good air entry bilaterally.   Cardiac:  Regular rate and rhythm, no murmur.   Extremities:  No deformities, edema, or skin discoloration.  Skin:  No rashes or lesions.  Neurological Exam: MENTAL STATUS including orientation to time, place, person, recent and remote memory, attention span and concentration, language, and fund of knowledge is normal.  Speech is not dysarthric. Montreal Cognitive Assessment  08/25/2015  Visuospatial/ Executive (0/5) 4  Naming (0/3) 3  Attention: Read list of digits (0/2) 2  Attention: Read list of letters (0/1) 1  Attention: Serial 7 subtraction starting at 100 (0/3) 3  Language: Repeat phrase (0/2) 2  Language : Fluency (0/1) 1  Abstraction (0/2) 2  Delayed Recall (0/5) 2  Orientation (0/6) 6  Total 26  Adjusted Score (based on education) 26   CRANIAL NERVES: II:  No  visual field defects.  Unremarkable fundi.   III-IV-VI: Pupils equal round and reactive to light.  Normal conjugate, extra-ocular eye movements in all directions of gaze.  No nystagmus.  No ptosis. V:  Normal facial sensation.   VII:  Normal facial symmetry and movements. VIII:  Normal hearing and vestibular function.   IX-X:  Normal palatal movement.   XI:  Normal shoulder shrug and head rotation.   XII:  Normal tongue strength and range of motion, no deviation or fasciculation.  MOTOR:  No atrophy, fasciculations or abnormal movements.  No pronator drift.  Tone is normal.    Right Upper Extremity:    Left Upper Extremity:    Deltoid  5/5   Deltoid  5/5   Biceps  5/5   Biceps  5/5   Triceps  5/5   Triceps  5/5   Wrist extensors  5/5   Wrist extensors  5/5   Wrist flexors  5/5   Wrist flexors  5/5   Finger extensors  5/5   Finger extensors  5/5   Finger flexors  5/5   Finger flexors  5/5   Dorsal interossei  5/5   Dorsal interossei  5/5   Abductor pollicis  5/5   Abductor pollicis  5/5   Tone (Ashworth scale)  0  Tone (Ashworth scale)  0   Right Lower Extremity:    Left Lower Extremity:    Hip flexors  5/5   Hip flexors  5/5   Hip extensors  5/5   Hip extensors  5/5   Knee flexors  5/5   Knee flexors  5/5   Knee extensors  5/5   Knee extensors  5/5   Dorsiflexors  5/5   Dorsiflexors  5/5   Plantarflexors  5/5   Plantarflexors  5/5   Toe extensors  5/5   Toe extensors  5/5   Toe flexors  5/5   Toe flexors  5/5   Tone (Ashworth scale)  0  Tone (Ashworth scale)  0   MSRs:  Right                                                                 Left brachioradialis 2+  brachioradialis 2+  biceps 2+  biceps 2+  triceps 2+  triceps 2+  patellar 2+  patellar 2+  ankle jerk 2+  ankle jerk 2+  Hoffman no  Hoffman no  plantar response down  plantar response down   SENSORY:  Normal and symmetric perception of light touch, pinprick, vibration, and proprioception.  Romberg's sign absent.    COORDINATION/GAIT: Normal finger-to- nose-finger and heel-to-shin.  Intact rapid alternating movements bilaterally. Gait is slightly antalgic, but overall stable.   IMPRESSION: Mrs. Hosea is a very pleasant 82 year-old female referred for evaluation of concerns of cognitive changes.  She scored well on her cognitive screen with MoCA 26/30 and denies any difficulty with her day-to-day activities.  She continues to maintain a very independent life. She endorses significant anxiety, which is understandable with the ailing health of her husband.  I reassured her that she does not have any signs of dementia, if anything, mild cognitive impairment which is most likely due to anxiety and less likely early dementia. I offered to have formal neuropsychological testing to better characterize the nature of her symptoms, but she would like to hold off on any additional testing at this time.           PLAN/RECOMMENDATIONS:  1.  Check vitamin B12 2.  Neuropsychological testing declined at this time by patient, but she will call if she changes her mind 3.  Recommend seeing a behavior counselor, but she would like to explore options through church  4.  Follow-up with cardiology for elevated blood pressure  Return to clinic as needed   The duration of this appointment visit was 45 minutes of face-to-face time with the patient.  Greater than 50% of this time was spent in counseling, explanation of diagnosis, planning of further management, and coordination of care.   Thank you for allowing me to participate in patient's care.  If I can answer any additional questions, I would be pleased to do so.    Sincerely,    Donika K. Posey Pronto, DO

## 2015-08-25 NOTE — Patient Instructions (Addendum)
Check vitamin B12 Recommend seeing a counselor and neuropsychological testing  Return to clinic as needed

## 2015-08-30 ENCOUNTER — Ambulatory Visit (INDEPENDENT_AMBULATORY_CARE_PROVIDER_SITE_OTHER): Payer: Medicare Other | Admitting: Family Medicine

## 2015-08-30 ENCOUNTER — Encounter: Payer: Self-pay | Admitting: Family Medicine

## 2015-08-30 VITALS — BP 148/82 | HR 82 | Ht 61.75 in | Wt 151.1 lb

## 2015-08-30 DIAGNOSIS — M47816 Spondylosis without myelopathy or radiculopathy, lumbar region: Secondary | ICD-10-CM | POA: Diagnosis not present

## 2015-08-30 DIAGNOSIS — M961 Postlaminectomy syndrome, not elsewhere classified: Secondary | ICD-10-CM | POA: Diagnosis not present

## 2015-08-30 DIAGNOSIS — G894 Chronic pain syndrome: Secondary | ICD-10-CM | POA: Diagnosis not present

## 2015-08-30 DIAGNOSIS — I10 Essential (primary) hypertension: Secondary | ICD-10-CM

## 2015-08-30 DIAGNOSIS — E538 Deficiency of other specified B group vitamins: Secondary | ICD-10-CM | POA: Insufficient documentation

## 2015-08-30 DIAGNOSIS — M5416 Radiculopathy, lumbar region: Secondary | ICD-10-CM | POA: Diagnosis not present

## 2015-08-30 HISTORY — DX: Deficiency of other specified B group vitamins: E53.8

## 2015-08-30 MED ORDER — CYANOCOBALAMIN 1000 MCG/ML IJ SOLN
1000.0000 ug | Freq: Once | INTRAMUSCULAR | Status: AC
  Administered 2015-08-30: 1000 ug via INTRAMUSCULAR

## 2015-08-30 NOTE — Assessment & Plan Note (Addendum)
Start Vitamin B12 1069mcg IM injection daily x 7 days, weekly x 4 weeks, then monthly thereafter x 1 year--> this is per Neuro, Dr Serita Grit recent note attached to the result of patient's recent B12 lab work.  -  Recheck in 5 weeks

## 2015-08-30 NOTE — Assessment & Plan Note (Addendum)
Being currently worked up by Dr. [name] of cardiology- last seen 08/09/2015.  She has upcoming appointment with the pharmacologist at their office as well in the very near future.    I explained to patient that I do not want to change or modify her medications at this time- we will leave that to the specialist/Dr. [name].   Also, at 148/82 with patient being 82 years old, she is at goal. We will await Dr. Kennon Holter note for further citation of what her goal blood pressures should be.     Once he has her on a stable medical regimen, I told patient I'm happy to follow her regularly, refill her medicines, get appropriate lab work etc.  She can then follow up with cardiology as they dictate or if she has any problems or concerns as needed.  ---> Continue to check ambulatory BPs and bring BP log in to next office visit with cardiology or myself

## 2015-08-30 NOTE — Assessment & Plan Note (Signed)
On Cymbalta, Mobic and Ultram at times.  We will be discussing whether or not she is a candidate for intrathecal TENS unit in the near future with Dr. Nelva Bush

## 2015-08-30 NOTE — Progress Notes (Signed)
[name], D.O. Primary care at [facility]  Subjective:    CC: New pt, here to establish care.   HPI: [name] is a pleasant 82 y.o. female who presents to [place] at [facility] today to become established.  She has 3 main concerns she would like to discuss today. Her blood pressure: She is currently seeing Dr. Gwenlyn Barr for, her B12 deficiency: Dr. Posey Barr of [place] [facility] and her chronic back pain: Treated by Dr. Nelva Barr pain management at [facility]  Was not happy with her doctor at [facility] on [address] this was close to her house so she thought she would try [name] out.  Very organized and inquisitive.   Dr [name] of [facility] she sees for her migraines, Barr B12 deficiency, and needs B12 injections once daily for 7 days.    B-12 Def Barr by Dr [name], of [facility], pt seeing as well, muscle deterioration, light skin, gen fatigue.   Dr. Nelva Barr- [facility] treats patients chronic lumbar radiculopathy and chronic back pain.  Dr. Gwenlyn Barr- seeing for active management of her rather new onset of hypertension.  Patient checks her blood pressure at home and it runs [id number] systolic; she does not know her diastolic numbers. She denies any chest pain, shortness of breath, visual change, dizziness, or peripheral edema  Emotional distress: Patient's elderly husband is sick and at end stages of Parkinson's disease currently. He has not eaten in 2 days.  This has caused some stress on patient and she occasionally uses Valium as needed if she gets emotionally worked up. She states she is think she is doing very well considering the circumstances. Patient does not think she needs to take a medicine on a daily basis, and is not interested in antidepressants or mood medications beyond her Cymbalta     Past Medical History  Diagnosis Date  . ARTHRITIS     s/p bilateral THR  . MIGRAINE HEADACHE   . Interstitial cystitis   . ALLERGIC RHINITIS    . ANEMIA, IRON DEFICIENCY   . ANXIETY   . ASTHMA, EXTRINSIC   . HYPERTENSION   . HYPERLIPIDEMIA   . G E R D   . DIABETES MELLITUS, TYPE II   . FIBROMYALGIA     fibromyalgia  . Chronic constipation   . Bronchitis   . High cholesterol   . Cataracts, both eyes     Past Surgical History  Procedure Laterality Date  . Cataract extraction    . Total hip arthroplasty    . Abdominal hysterectomy    . Ovarian cyst removed    . Lumbar disc surgery    . Colonoscopy    . Esophagogastroduodenoscopy endoscopy    . Doppler echocardiography    . Nm myoview ltd      stress test    Family History  Problem Relation Age of Onset  . Arthritis Mother   . Hypertension Mother   . Heart disease Father   . Arthritis Father   . Hypertension Father   . Depression Sister   . Hyperlipidemia Sister   . Hypertension Other   . Hyperlipidemia Other     History  Drug Use No  ,  History  Alcohol Use No  ,  History  Smoking status  . Former Smoker -- 0.30 packs/day for 10 years  . Types: Cigarettes  . Quit date: 03/25/1968  Smokeless tobacco  . Never Used  Comment: Married with two children. Retired Web designer  ,  History  Sexual Activity  . Sexual Activity: Not Currently    Patient's Medications  New Prescriptions   No medications on file  Previous Medications   ACETAMINOPHEN (TYLENOL) 500 MG TABLET    Take 1,000 mg by mouth every 6 (six) hours as needed. For pain   ALBUTEROL (PROAIR HFA) 108 (90 BASE) MCG/ACT INHALER    Inhale 2 puffs into the lungs every 6 (six) hours as needed.   AMLODIPINE (NORVASC) 5 MG TABLET    Take 1 tablet (5 mg total) by mouth daily.   BUDESONIDE-FORMOTEROL (SYMBICORT) 80-4.5 MCG/ACT INHALER    Inhale 2 puffs into the lungs 2 (two) times daily.   CETIRIZINE (ZYRTEC) 10 MG TABLET    Take 10 mg by mouth daily.    CHOLECALCIFEROL (VITAMIN D3) 1000 UNITS CAPS    Take 1 capsule by mouth daily.    DIAZEPAM (VALIUM) 5 MG TABLET    Take 1 tablet (5 mg total) by  mouth every 12 (twelve) hours as needed. for anxiety   DOCUSATE SODIUM (STOOL SOFTENER) 100 MG CAPSULE    Take 100 mg by mouth daily.     DULOXETINE (CYMBALTA) 60 MG CAPSULE    Take 1 capsule (60 mg total) by mouth daily.   FLUTICASONE (FLONASE) 50 MCG/ACT NASAL SPRAY    USE 2 SPRAYS INTO EACH NOSTRIL EVERY DAY   LOSARTAN (COZAAR) 100 MG TABLET    Take 1 tablet (100 mg total) by mouth daily.   MELOXICAM (MOBIC) 15 MG TABLET    Take 1 tablet (15 mg total) by mouth daily.   MONTELUKAST (SINGULAIR) 10 MG TABLET    Take 1 tablet (10 mg total) by mouth at bedtime.   NYSTATIN (MYCOSTATIN/NYSTOP) 100000 UNIT/GM POWD    Apply BID prn   TRAMADOL (ULTRAM) 50 MG TABLET    Take 50 mg by mouth every 6 (six) hours as needed. Reported on 05/23/2015   TRIAMCINOLONE CREAM (KENALOG) 0.1 %    Apply 1 application topically 2 (two) times daily as needed.   VALACYCLOVIR (VALTREX) 500 MG TABLET    Take 1 tablet (500 mg total) by mouth daily as needed (for break outs).   ZOLPIDEM (AMBIEN) 5 MG TABLET    Take 1 tablet (5 mg total) by mouth at bedtime as needed for sleep.  Modified Medications   No medications on file  Discontinued Medications   No medications on file    ALLERGIES: Morphine sulfate   Review of Systems: Full 14 point ROS performed via "adult medical history form".  Negative except for noted above    Objective:   Blood pressure 148/82, pulse 82, height 5' 1.75" (1.568 m), weight 151 lb 1.6 oz (68.539 kg). Body mass index is 27.88 kg/(m^2).  General: Well Developed, well nourished, and in no acute distress.  Neuro: Alert and oriented x3, extra-ocular muscles intact, sensation grossly intact.  HEENT: Normocephalic, atraumatic, pupils equal round reactive to light, neck supple, no gross masses, no carotid bruits, no JVD apprec Skin: no gross suspicious lesions or rashes  Cardiac: Regular rate and rhythm, no murmurs rubs or gallops.  Respiratory: Essentially clear to auscultation bilaterally. Not  using accessory muscles, speaking in full sentences.  Abdominal: Soft, not grossly distended Musculoskeletal: Ambulates w/o diff, FROM * 4 ext.  Vasc: less 2 sec cap RF, warm and pink  Psych:  No HI/SI, judgement and insight good.    Impression and Recommendations:  The patient was counselled, risk factors were discussed, anticipatory guidance given.  Gross side effects, risk and benefits, and alternatives of medications discussed with patient.  Patient is aware that all medications have potential side effects and we are unable to predict every sideeffect or drug-drug interaction that may occur.  Expresses verbal understanding and consents to current therapy plan and treatment regiment.    Note: This document was prepared using Dragon voice recognition software and may include unintentional dictation errors.

## 2015-08-30 NOTE — Patient Instructions (Addendum)
Reck levels B12 in 5-6wks- lab only visit Make appt for CPE August including fasting bldwork      Vitamin B12 Deficiency Not having enough vitamin B12 is called a deficiency. Vitamin B12 is an important vitamin. Your body needs vitamin B12 to:   Make red blood cells.  Make DNA. This is the genetic material inside all of your cells.  Help your nerves work properly so they can carry messages from your brain to your body. CAUSES  Not eating enough foods that contain vitamin B12.  Not having enough stomach acid and digestive juices. The body needs these to absorb vitamin B12 from the food you eat.  Having certain digestive system diseases that make it hard to absorb vitamin B12. These diseases include Crohn's disease, chronic pancreatitis, and cystic fibrosis.  Having pernicious anemia, which is a condition where the body has too few red blood cells. People with this condition do not make enough of a protein called "intrinsic factor," which is needed to absorb vitamin B12.  Having a surgery in which part of the stomach or small intestine is removed.  Taking certain medicines that make it hard for the body to absorb vitamin B12. These medicines include:  Heartburn medicine (antacids and proton pump inhibitors).  A certain antibiotic medicine called neomycin, which fights infection.  Some medicines used to treat diabetes, tuberculosis, gout, and high cholesterol. RISK FACTORS Risk factors are things that make you more likely to develop a vitamin B12 deficiency. They include:  Being older than 37.  Being a vegetarian.  Being pregnant and a vegetarian or having a poor diet.  Taking certain drugs.  Being an alcoholic. SYMPTOMS You may have a vitamin B12 deficiency with no symptoms. However, a vitamin B12 deficiency can cause health problems like anemia and nerve damage. These health problems can lead to many possible symptoms, including:  Weakness.  Fatigue.  Loss of  appetite.  Weight loss.  Numbness or tingling in your hands and feet.  Redness and burning of the tongue.  Confusion or memory problems.  Depression.  Dizziness.  Sensory problems, such as loss of taste, color blindness, and ringing in the ears.  Diarrhea or constipation.  Trouble walking. DIAGNOSIS Various types of tests can be given to help find the cause of your vitamin B12 deficiency. These tests include:  A complete blood count (CBC). This test gives your caregiver an overall picture of what makes up your blood.  A blood test to measure your B12 level.  A blood test to measure intrinsic factor.  An endoscopy. This procedure uses a thin tube with a camera on the end to look into your stomach or intestines. TREATMENT Treatment for vitamin B12 deficiency depends on what is causing it. Common options include:  Changing your eating and drinking habits, such as:  Eating more foods that contain vitamin B12.  Not drinking as much alcohol or any alcohol.  Taking vitamin B12 supplements. Your caregiver will tell you what dose is best for you.  Getting vitamin B12 injections. Some people get these a few times a week. Others get them once a month. HOME CARE INSTRUCTIONS  Take all supplements as directed by your caregiver. Follow the directions carefully.  Get any injections your caregiver prescribes. Do not miss your appointments.  Eat lots of healthy foods that contain vitamin B12. Ask your caregiver if you should work with a nutritionist. Good things to include in your diet are:  Meat.  Poultry.  Fish.  Eggs.  Fortified cereal and dairy products. This means vitamin B12 has been added to the food. Check the label on the package to be sure.  Do not abuse alcohol.  Keep all follow-up appointments. Your caregiver will need to perform blood tests to make sure your vitamin B12 deficiency is going away. SEEK MEDICAL CARE IF:  You have any questions about your  treatment.  Your symptoms come back. MAKE SURE YOU:  Understand these instructions.  Will watch your condition.  Will get help right away if you are not doing well or get worse.   This information is not intended to replace advice given to you by your health care provider. Make sure you discuss any questions you have with your health care provider.   Document Released: 06/03/2011 Document Reviewed: 07/27/2014 Elsevier Interactive Patient Education 2016 [place], Vitamin B12 injection What is this medicine? CYANOCOBALAMIN (sye an oh koe BAL a min) is a man made form of vitamin B12. Vitamin B12 is used in the growth of healthy blood cells, nerve cells, and proteins in the body. It also helps with the metabolism of fats and carbohydrates. This medicine is used to treat people who can not absorb vitamin B12. This medicine may be used for other purposes; ask your health care provider or pharmacist if you have questions. What should I tell my health care provider before I take this medicine? They need to know if you have any of these conditions: -kidney disease -Leber's disease -megaloblastic anemia -an unusual or allergic reaction to cyanocobalamin, cobalt, other medicines, foods, dyes, or preservatives -pregnant or trying to get pregnant -breast-feeding How should I use this medicine? This medicine is injected into a muscle or deeply under the skin. It is usually given by a health care professional in a clinic or doctor's office. However, your doctor may teach you how to inject yourself. Follow all instructions. Talk to your pediatrician regarding the use of this medicine in children. Special care may be needed. Overdosage: If you think you have taken too much of this medicine contact a poison control center or emergency room at once. NOTE: This medicine is only for you. Do not share this medicine with others. What if I miss a dose? If you are given your dose at a  clinic or doctor's office, call to reschedule your appointment. If you give your own injections and you miss a dose, take it as soon as you can. If it is almost time for your next dose, take only that dose. Do not take double or extra doses. What may interact with this medicine? -colchicine -heavy alcohol intake This list may not describe all possible interactions. Give your health care provider a list of all the medicines, herbs, non-prescription drugs, or dietary supplements you use. Also tell them if you smoke, drink alcohol, or use illegal drugs. Some items may interact with your medicine. What should I watch for while using this medicine? Visit your doctor or health care professional regularly. You may need blood work done while you are taking this medicine. You may need to follow a special diet. Talk to your doctor. Limit your alcohol intake and avoid smoking to get the best benefit. What side effects may I notice from receiving this medicine? Side effects that you should report to your doctor or health care professional as soon as possible: -allergic reactions like skin rash, itching or hives, swelling of the face, lips, or tongue -blue tint to skin -chest tightness, pain -difficulty breathing, wheezing -  dizziness -red, swollen painful area on the leg Side effects that usually do not require medical attention (report to your doctor or health care professional if they continue or are bothersome): -diarrhea -headache This list may not describe all possible side effects. Call your doctor for medical advice about side effects. You may report side effects to FDA at 1-800-FDA-1088. Where should I keep my medicine? Keep out of the reach of children. Store at room temperature between 15 and 30 degrees C (59 and 85 degrees F). Protect from light. Throw away any unused medicine after the expiration date. NOTE: This sheet is a summary. It may not cover all possible information. If you have  questions about this medicine, talk to your doctor, pharmacist, or health care provider.    2016, Elsevier/Gold Standard. (2007-06-22 22:10:20)

## 2015-08-31 ENCOUNTER — Ambulatory Visit: Payer: Medicare Other

## 2015-08-31 DIAGNOSIS — E538 Deficiency of other specified B group vitamins: Secondary | ICD-10-CM | POA: Diagnosis not present

## 2015-08-31 MED ORDER — CYANOCOBALAMIN 1000 MCG/ML IJ SOLN
1000.0000 ug | Freq: Once | INTRAMUSCULAR | Status: AC
  Administered 2015-08-31: 1000 ug via INTRAMUSCULAR

## 2015-08-31 NOTE — Progress Notes (Signed)
Pt here for B12 injection.  Injection administered in left deltoid.  Pt tolerated well.  Charyl Bigger, CMA

## 2015-09-01 ENCOUNTER — Ambulatory Visit (INDEPENDENT_AMBULATORY_CARE_PROVIDER_SITE_OTHER): Payer: Medicare Other

## 2015-09-01 DIAGNOSIS — E538 Deficiency of other specified B group vitamins: Secondary | ICD-10-CM | POA: Diagnosis not present

## 2015-09-01 MED ORDER — CYANOCOBALAMIN 1000 MCG/ML IJ SOLN
1000.0000 ug | Freq: Once | INTRAMUSCULAR | Status: AC
  Administered 2015-09-01: 1000 ug via INTRAMUSCULAR

## 2015-09-04 ENCOUNTER — Ambulatory Visit (INDEPENDENT_AMBULATORY_CARE_PROVIDER_SITE_OTHER): Payer: Medicare Other | Admitting: Family Medicine

## 2015-09-04 DIAGNOSIS — E538 Deficiency of other specified B group vitamins: Secondary | ICD-10-CM

## 2015-09-05 ENCOUNTER — Ambulatory Visit (INDEPENDENT_AMBULATORY_CARE_PROVIDER_SITE_OTHER): Payer: Medicare Other

## 2015-09-05 DIAGNOSIS — D510 Vitamin B12 deficiency anemia due to intrinsic factor deficiency: Secondary | ICD-10-CM

## 2015-09-05 MED ORDER — CYANOCOBALAMIN 1000 MCG/ML IJ SOLN
1000.0000 ug | Freq: Once | INTRAMUSCULAR | Status: AC
  Administered 2015-09-05: 1000 ug via INTRAMUSCULAR

## 2015-09-05 NOTE — Progress Notes (Unsigned)
This encounter was created in error - please disregard.  This encounter was created in error - please disregard.

## 2015-09-05 NOTE — Progress Notes (Signed)
   Subjective:    Patient ID: [name], female    DOB: Jul 31, 1933, 82 y.o.   MRN: [id number]  HPI  Pt here for Vitamin B12 injection.  Pt states that she has had some diarrhea, but only 1-2 episodes per day.  Review of Systems     Objective:   Physical Exam        Assessment & Plan:  Patient tolerated injection well without complications.  Patient advised to [facility] 09/06/2015 for next injection.

## 2015-09-06 ENCOUNTER — Ambulatory Visit (INDEPENDENT_AMBULATORY_CARE_PROVIDER_SITE_OTHER): Payer: Medicare Other

## 2015-09-06 VITALS — BP 149/81 | HR 78 | Wt 150.4 lb

## 2015-09-06 DIAGNOSIS — D510 Vitamin B12 deficiency anemia due to intrinsic factor deficiency: Secondary | ICD-10-CM | POA: Diagnosis not present

## 2015-09-06 MED ORDER — CYANOCOBALAMIN 1000 MCG/ML IJ SOLN
1000.0000 ug | Freq: Once | INTRAMUSCULAR | Status: AC
  Administered 2015-09-06: 1000 ug via INTRAMUSCULAR

## 2015-09-06 NOTE — Progress Notes (Signed)
   Subjective:    Patient ID: [name], female    DOB: 04-21-1933, 82 y.o.   MRN: [id number]  HPI  Pt is here for a vitamin B12 injection.  Denies gastrointestinal problems or dizziness  Review of Systems     Objective:   Physical Exam        Assessment & Plan:  Patient tolerated injection well without complications. Patient advised to schedule next injection for tomorrow (09/07/2015).  Charyl Bigger, CMA

## 2015-09-07 ENCOUNTER — Ambulatory Visit (INDEPENDENT_AMBULATORY_CARE_PROVIDER_SITE_OTHER): Payer: Medicare Other

## 2015-09-07 ENCOUNTER — Ambulatory Visit: Payer: Medicare Other | Admitting: Internal Medicine

## 2015-09-07 ENCOUNTER — Ambulatory Visit (INDEPENDENT_AMBULATORY_CARE_PROVIDER_SITE_OTHER): Payer: Medicare Other | Admitting: Pharmacist

## 2015-09-07 VITALS — Wt 150.1 lb

## 2015-09-07 VITALS — BP 149/67 | HR 88 | Wt 150.4 lb

## 2015-09-07 DIAGNOSIS — I10 Essential (primary) hypertension: Secondary | ICD-10-CM | POA: Diagnosis not present

## 2015-09-07 DIAGNOSIS — D510 Vitamin B12 deficiency anemia due to intrinsic factor deficiency: Secondary | ICD-10-CM

## 2015-09-07 MED ORDER — CYANOCOBALAMIN 1000 MCG/ML IJ SOLN
1000.0000 ug | Freq: Once | INTRAMUSCULAR | Status: AC
  Administered 2015-09-07: 1000 ug via INTRAMUSCULAR

## 2015-09-07 MED ORDER — AMLODIPINE BESYLATE 10 MG PO TABS: 10.0000 mg | ORAL_TABLET | Freq: Every day | ORAL | Status: DC

## 2015-09-07 NOTE — Patient Instructions (Signed)
Return for a a follow up appointment in 2-3 month.   Your blood pressure today is 140/74 Goal blood pressure is <140/90 if blood pressure decreases below 100/60 please call clinic or if you experience dizziness and low blood pressure.   Check your blood pressure at home daily (if able) and keep record of the readings.  Take your BP meds as follows: Start taking amlodipine 10mg  daily (you can take 2 of the 5 mg tablets until you complete your supply then pick up higher strength at pharmacy).   Bring all of your meds, your BP cuff and your record of home blood pressures to your next appointment.  Exercise as you're able, try to walk approximately 30 minutes per day.  Keep salt intake to a minimum, especially watch canned and prepared boxed foods.  Eat more fresh fruits and vegetables and fewer canned items.  Avoid eating in fast food restaurants.    HOW TO TAKE YOUR BLOOD PRESSURE: . Rest 5 minutes before taking your blood pressure. .  Don't smoke or drink caffeinated beverages for at least 30 minutes before. . Take your blood pressure before (not after) you eat. . Sit comfortably with your back supported and both feet on the floor (don't cross your legs). . Elevate your arm to heart level on a table or a desk. . Use the proper sized cuff. It should fit smoothly and snugly around your bare upper arm. There should be enough room to slip a fingertip under the cuff. The bottom edge of the cuff should be 1 inch above the crease of the elbow. . Ideally, take 3 measurements at one sitting and record the average.

## 2015-09-07 NOTE — Progress Notes (Signed)
Patient ID: [name]                 DOB: Aug 16, 1933                      MRN: [id number]     HPI: [name] is a 82 y.o. female referred by Dr. [name] to HTN clinic for evaluation. Her cardiac history includes HTN, HLD, LBBB. She was recently seen by Dr. [name] who started her on amlodipine 5mg  once daily.  She was also recently found to be vitamin B12 deficient and has been receiving shots to replete her supplies.   She is currently under a lot of stress as her husband is immobile due to his progressive parkinson disease.   Current HTN meds:  Amlodipine 5mg   Losartan 100 mg in the evening  BP goal: <140/90 - diabetes  Family History: no cardiac history that she is aware. One sister has high cholesterol she thinks.   Social History: smoked "recreationally" in 65s and 54s. Quit at 82 years old. Never used smokeless tobacco and denies alcohol  Diet: She eats all of her meals at home due to her husbands condition. Everything is cooked fresh occasionally using seasonings or salts/butter. She endorses 2-3 diet pepsi bottles a day. She also drinks 1 cup of coffee black each day.   Exercise: She is currently not exercising due to her back and taking care of her husband  Home BP readings: She has a cuff that is greater than 30 years old. Morning readings are 150/60-70s Evening readings are 120-130/60s  Wt Readings from Last 3 Encounters:  09/07/15 150 lb 1.6 oz (68.085 kg)  09/06/15 150 lb 6.4 oz (68.221 kg)  08/30/15 151 lb 1.6 oz (68.539 kg)   BP Readings from Last 3 Encounters:  09/06/15 149/81  08/30/15 148/82  08/25/15 150/90   Pulse Readings from Last 3 Encounters:  09/06/15 78  08/30/15 82  08/25/15 102    Renal function: CrCl cannot be calculated (Patient has no serum creatinine result on file.).  Past Medical History  Diagnosis Date  . ARTHRITIS     s/p bilateral THR  . MIGRAINE HEADACHE   . Interstitial cystitis   . ALLERGIC RHINITIS   . ANEMIA, IRON  DEFICIENCY   . ANXIETY   . ASTHMA, EXTRINSIC   . HYPERTENSION   . HYPERLIPIDEMIA   . G E R D   . DIABETES MELLITUS, TYPE II   . FIBROMYALGIA     fibromyalgia  . Chronic constipation   . Bronchitis   . High cholesterol   . Cataracts, both eyes     Current Outpatient Prescriptions on File Prior to Visit  Medication Sig Dispense Refill  . acetaminophen (TYLENOL) 500 MG tablet Take 1,000 mg by mouth every 6 (six) hours as needed. For pain    . albuterol (PROAIR HFA) 108 (90 BASE) MCG/ACT inhaler Inhale 2 puffs into the lungs every 6 (six) hours as needed. 3 Inhaler 3  . amLODipine (NORVASC) 5 MG tablet Take 1 tablet (5 mg total) by mouth daily. 90 tablet 3  . budesonide-formoterol (SYMBICORT) 80-4.5 MCG/ACT inhaler Inhale 2 puffs into the lungs 2 (two) times daily. 1 Inhaler 12  . cetirizine (ZYRTEC) 10 MG tablet Take 10 mg by mouth daily.     . Cholecalciferol (VITAMIN D3) 1000 UNITS CAPS Take 1 capsule by mouth daily.     . diazepam (VALIUM) 5 MG tablet Take 1 tablet (5  mg total) by mouth every 12 (twelve) hours as needed. for anxiety 60 tablet 1  . Docusate Sodium (STOOL SOFTENER) 100 MG capsule Take 100 mg by mouth daily.      . DULoxetine (CYMBALTA) 60 MG capsule Take 1 capsule (60 mg total) by mouth daily. 90 capsule 3  . fluticasone (FLONASE) 50 MCG/ACT nasal spray USE 2 SPRAYS INTO EACH NOSTRIL EVERY DAY 48 g 3  . losartan (COZAAR) 100 MG tablet Take 1 tablet (100 mg total) by mouth daily. 90 tablet 3  . meloxicam (MOBIC) 15 MG tablet Take 1 tablet (15 mg total) by mouth daily. 90 tablet 3  . montelukast (SINGULAIR) 10 MG tablet Take 1 tablet (10 mg total) by mouth at bedtime. 90 tablet 3  . nystatin (MYCOSTATIN/NYSTOP) 100000 UNIT/GM POWD Apply BID prn 60 g 0  . traMADol (ULTRAM) 50 MG tablet Take 50 mg by mouth every 6 (six) hours as needed. Reported on 05/23/2015    . triamcinolone cream (KENALOG) 0.1 % Apply 1 application topically 2 (two) times daily as needed.    .  valACYclovir (VALTREX) 500 MG tablet Take 1 tablet (500 mg total) by mouth daily as needed (for break outs). 30 tablet 0  . zolpidem (AMBIEN) 5 MG tablet Take 1 tablet (5 mg total) by mouth at bedtime as needed for sleep. 30 tablet 3   No current facility-administered medications on file prior to visit.    Allergies  Allergen Reactions  . Morphine Sulfate Other (See Comments)    Makes her hyper     Assessment/Plan: Hypertension: BP is slightly above goal today. Her home cuff appears to be relatively accurate despite its age. Will increase amlodipine to 10mg  once daily. Instructed her to bring her cuff for reevaluation at next visit as well given age. Follow up in hypertension clinic in 2-3 months.   Thank you, Lelan Pons. [place], [place]

## 2015-09-07 NOTE — Progress Notes (Signed)
   Subjective:    Patient ID: [name], female    DOB: 07-04-1933, 82 y.o.   MRN: [id number]  HPI  Pt is here for a vitamin B12 injection.  Pt denies gastrointestinal problems or dizziness.  Review of Systems     Objective:   Physical Exam        Assessment & Plan:  Patient tolerated injection well without complications.  Patient advised to schedule next injection in 30 days.

## 2015-09-08 ENCOUNTER — Ambulatory Visit: Payer: Medicare Other

## 2015-09-10 ENCOUNTER — Encounter: Payer: Self-pay | Admitting: Pharmacist

## 2015-09-10 NOTE — Assessment & Plan Note (Signed)
BP is slightly above goal today. Her home cuff appears to be relatively accurate despite its age. Will increase amlodipine to 10mg  once daily. Instructed her to bring her cuff for reevaluation at next visit as well given age. Follow up in hypertension clinic in 2-3 months.

## 2015-09-14 ENCOUNTER — Ambulatory Visit (INDEPENDENT_AMBULATORY_CARE_PROVIDER_SITE_OTHER): Payer: Medicare Other

## 2015-09-14 DIAGNOSIS — D510 Vitamin B12 deficiency anemia due to intrinsic factor deficiency: Secondary | ICD-10-CM

## 2015-09-14 MED ORDER — CYANOCOBALAMIN 1000 MCG/ML IJ SOLN: 1000.0000 ug | Freq: Once | INTRAMUSCULAR | Status: DC

## 2015-09-14 MED ORDER — CYANOCOBALAMIN 1000 MCG/ML IJ SOLN
1000.0000 ug | Freq: Once | INTRAMUSCULAR | Status: AC
  Administered 2015-09-14: 1000 ug via INTRAMUSCULAR

## 2015-09-14 NOTE — Progress Notes (Signed)
   Subjective:    Patient ID: [name], female    DOB: [birth year], 82 y.o.   MRN: [id number]  HPI  Pt is here for a vitamin B12 injection.  Pt denies gastrointestinal problems or dizziness.   Review of Systems     Objective:   Physical Exam        Assessment & Plan:   Patient tolerated injection well without complications.  Patient advised to schedule next injection in 30 days.   RX sent to pharmacy in error.  Contacted pharmacy and had them cancel the RX.  B12 injection given in office.

## 2015-09-21 ENCOUNTER — Encounter: Payer: Self-pay | Admitting: Family Medicine

## 2015-09-21 ENCOUNTER — Ambulatory Visit (INDEPENDENT_AMBULATORY_CARE_PROVIDER_SITE_OTHER): Payer: Medicare Other | Admitting: Family Medicine

## 2015-09-21 VITALS — BP 120/75 | HR 83

## 2015-09-21 DIAGNOSIS — F411 Generalized anxiety disorder: Secondary | ICD-10-CM | POA: Diagnosis not present

## 2015-09-21 DIAGNOSIS — F329 Major depressive disorder, single episode, unspecified: Secondary | ICD-10-CM | POA: Diagnosis not present

## 2015-09-21 DIAGNOSIS — F430 Acute stress reaction: Secondary | ICD-10-CM | POA: Diagnosis not present

## 2015-09-21 DIAGNOSIS — R5383 Other fatigue: Secondary | ICD-10-CM

## 2015-09-21 DIAGNOSIS — E538 Deficiency of other specified B group vitamins: Secondary | ICD-10-CM

## 2015-09-21 DIAGNOSIS — F32A Depression, unspecified: Secondary | ICD-10-CM

## 2015-09-21 MED ORDER — CYANOCOBALAMIN 1000 MCG/ML IJ SOLN
1000.0000 ug | Freq: Once | INTRAMUSCULAR | Status: AC
  Administered 2015-09-21: 1000 ug via INTRAMUSCULAR

## 2015-09-21 NOTE — Assessment & Plan Note (Signed)
Throughout patient's life, it appears there been multiple incidences where she experiences extreme stress in the form of fatigue and feeling exhausted.

## 2015-09-21 NOTE — Patient Instructions (Addendum)
- I recommend she take the advice of the Neuro doc and do the neuropsychological testing in near future  - use ambien (zolpidem) as needed for sleep if MELATONIN 3mg - 6mg  before bedtime doesn't work     Melatonin oral solid dosage forms What is this medicine? MELATONIN (mel uh TOH nin) is a dietary supplement. It is mostly promoted to help maintain normal sleep patterns. The FDA has not approved this supplement for any medical use. This supplement may be used for other purposes; ask your health care provider or pharmacist if you have questions. This medicine may be used for other purposes; ask your health care provider or pharmacist if you have questions. What should I tell my health care provider before I take this medicine? They need to know if you have any of these conditions: -asthma -cancer -depression or mental illness -diabetes -hormone problems -if you often drink alcohol -immune system problems -liver disease -organ transplant -seizure disorder -an unusual or allergic reaction to melatonin, other medicines, foods, dyes, or preservatives -pregnant or trying to get pregnant -breast-feeding How should I use this medicine? Take this supplement by mouth with a glass of water. Do not take with food. This supplement is usually taken 1 or 2 hours before bedtime. After taking this supplement, limit your activities to those needed to prepare for bed. Some products may be chewed or dissolved in the mouth before swallowing. Some tablets or capsules must be swallowed whole; do not cut, crush or chew. Follow the directions on the package labeling, or take as directed by your health care professional. Do not take this supplement more often than directed. Talk to your pediatrician regarding the use of this supplement in children. Special care may be needed. This supplement is not recommended for use in children without a prescription. Overdosage: If you think you have taken too much of this  medicine contact a poison control center or emergency room at once. NOTE: This medicine is only for you. Do not share this medicine with others. What if I miss a dose? If you miss taking your dose at the usual time, skip that dose. If it is almost time for your next dose, take only that dose. Do not take double or extra doses. What may interact with this medicine? Do not take this medicine with any of the following medications: -fluvoxamine -ramelteon -tasimelteon This medicine may also interact with the following medications: -alcohol -atazanavir -caffeine -carbamazepine -certain antibiotics like ciprofloxacin, enoxacin -certain medicines for depression, anxiety, or psychotic disturbances -cimetidine -female hormones, like estrogens and birth control pills, patches, rings, or injections -methoxsalen -nifedipine -other medications for sleep -other herbal or dietary supplements -phenobarbital -rifampin -smoking tobacco -tamoxifen -treatments for cancer, organ transplant, or immune disorders This list may not describe all possible interactions. Give your health care provider a list of all the medicines, herbs, non-prescription drugs, or dietary supplements you use. Also tell them if you smoke, drink alcohol, or use illegal drugs. Some items may interact with your medicine. What should I watch for while using this medicine? See your doctor if your symptoms do not get better or if they get worse. Do not take this supplement for more than 2 weeks unless your doctor tells you to. You may get drowsy or dizzy. Do not drive, use machinery, or do anything that needs mental alertness until you know how this medicine affects you. Do not stand or sit up quickly, especially if you are an older patient. This reduces the risk of  dizzy or fainting spells. Alcohol may interfere with the effect of this medicine. Avoid alcoholic drinks. Talk to your doctor before you use this supplement if you are  currently being treated for an emotional, mental, or sleep problem. This medicine may interfere with your treatment. Herbal or dietary supplements are not regulated like medicines. Rigid quality control standards are not required for dietary supplements. The purity and strength of these products can vary. The safety and effect of this dietary supplement for a certain disease or illness is not well known. This product is not intended to diagnose, treat, cure or prevent any disease. The Food and Drug Administration suggests the following to help consumers protect themselves: -Always read product labels and follow directions. -Natural does not mean a product is safe for humans to take. -Look for products that include USP after the ingredient name. This means that the manufacturer followed the standards of the [name] Pharmacopoeia. -Supplements made or sold by a nationally known food or drug company are more likely to be made under tight controls. You can write to the company for more information about how the product was made. What side effects may I notice from receiving this medicine? Side effects that you should report to your doctor or health care professional as soon as possible: -allergic reactions like skin rash, itching or hives, swelling of the face, lips, or tongue -breathing problems -change in sex drive or performance -confusion -depressed mood, irritable, or other changes in moods or behaviors -fast, irregular heartbeat -feeling faint or lightheaded, falls -irregular or missed menstrual periods -leakage of milk from the nipples in a person who is not breast-feeding -signs and symptoms of liver injury like dark yellow or brown urine; general ill feeling or flu-like symptoms; light-colored stools; loss of appetite; nausea; right upper belly pain; unusually weak or tired; yellowing of the eyes or skin -sleep-walking -trouble staying awake or alert during the day -unusual activities while you  are still asleep like driving, eating, making phone calls -unusual bleeding or bruising Side effects that usually do not require medical attention (report to your doctor or health care professional if they continue or are bothersome): -dizziness -drowsiness -headache -tiredness -unusual dreams or nightmares -upset stomach This list may not describe all possible side effects. Call your doctor for medical advice about side effects. You may report side effects to FDA at 1-800-FDA-1088. Where should I keep my medicine? Keep out of the reach of children. Store at room temperature or as directed on the package label. Protect from moisture. Throw away any unused supplement after the expiration date. NOTE: This sheet is a summary. It may not cover all possible information. If you have questions about this medicine, talk to your doctor, pharmacist, or health care provider.    2016, Elsevier/Gold Standard. (2014-11-23 09:44:59)

## 2015-09-21 NOTE — Progress Notes (Signed)
Subjective:    Chief Complaint  Patient presents with  . Fatigue    HPI: [name] is a 82 y.o. female who presents to [place] at [facility] today because of fatigue/ feeling tired and unmotivated to do anything.   Her husband died just 9 days ago.  He was ill for a good year or more and she was well prepared financially and logistically but yet not emotionally.    She has a son Nori Riis in [place] and a daughter [name] living right near her here in [facility].    When b12 injections changed from daily to [facility] she felt a decrease in energy level and feels more sluggish/ sleepy and legs ache.   wants to know if she can go to daily injections constantly/ or more than one per week.  She is wondering if her recent sluggishness the past 2 days is from lack of B12.  Mood- "ok".    - last two nights- diff with sleep, prior was sleeping fine.  Feels like she just sits there and feeling depressed.   Have not tried sleeping pills she was given-Ambien and wondering if she should start them.    She took a Valium the day of the funeral but otherwise she does not use it.    During the office visit she recalled many episodes in her past where she denies feeling "depressed ", but was extremely exhausted and tired when going through a very stressful times.  She discussed miscarriages in her past where she just recalls being exhausted and not really depressed. She also recalls difficult times during her marriage and or professional life where exhaustion and fatigue per dominated and not anxiety, or stress, or depression when undergoing very trying times.  I reviewed the most recent note from the neurologist Dr. Posey Pronto who patient recently seen in early June.       Past Medical History  Diagnosis Date  . ARTHRITIS     s/p bilateral THR  . MIGRAINE HEADACHE   . Interstitial cystitis   . ALLERGIC RHINITIS   . ANEMIA, IRON DEFICIENCY   . ANXIETY   . ASTHMA, EXTRINSIC   .  HYPERTENSION   . HYPERLIPIDEMIA   . G E R D   . DIABETES MELLITUS, TYPE II   . FIBROMYALGIA     fibromyalgia  . Chronic constipation   . Bronchitis   . High cholesterol   . Cataracts, both eyes      Past Surgical History  Procedure Laterality Date  . Cataract extraction    . Total hip arthroplasty    . Abdominal hysterectomy    . Ovarian cyst removed    . Lumbar disc surgery    . Colonoscopy    . Esophagogastroduodenoscopy endoscopy    . Doppler echocardiography    . Nm myoview ltd      stress test     Family History  Problem Relation Age of Onset  . Arthritis Mother   . Hypertension Mother   . Heart disease Father   . Arthritis Father   . Hypertension Father   . Depression Sister   . Hyperlipidemia Sister   . Hypertension Other   . Hyperlipidemia Other      History  Drug Use No  ,  History  Alcohol Use No  ,  History  Smoking status  . Former Smoker -- 0.30 packs/day for 10 years  . Types:  Cigarettes  . Quit date: 03/25/1968  Smokeless tobacco  . Never Used    Comment: Married with two children. Retired Web designer  ,  History  Sexual Activity  . Sexual Activity: Not Currently      Current Outpatient Prescriptions on File Prior to Visit  Medication Sig Dispense Refill  . acetaminophen (TYLENOL) 500 MG tablet Take 1,000 mg by mouth every 6 (six) hours as needed. For pain    . albuterol (PROAIR HFA) 108 (90 BASE) MCG/ACT inhaler Inhale 2 puffs into the lungs every 6 (six) hours as needed. 3 Inhaler 3  . amLODipine (NORVASC) 10 MG tablet Take 1 tablet (10 mg total) by mouth daily. 180 tablet 3  . budesonide-formoterol (SYMBICORT) 80-4.5 MCG/ACT inhaler Inhale 2 puffs into the lungs 2 (two) times daily. 1 Inhaler 12  . cetirizine (ZYRTEC) 10 MG tablet Take 10 mg by mouth daily.     . Cholecalciferol (VITAMIN D3) 1000 UNITS CAPS Take 1 capsule by mouth daily.     . cyanocobalamin (,VITAMIN B-12,) 1000 MCG/ML injection Inject 1 mL (1,000 mcg total) into  the muscle once. 1 mL 0  . diazepam (VALIUM) 5 MG tablet Take 1 tablet (5 mg total) by mouth every 12 (twelve) hours as needed. for anxiety 60 tablet 1  . Docusate Sodium (STOOL SOFTENER) 100 MG capsule Take 100 mg by mouth daily.      . DULoxetine (CYMBALTA) 60 MG capsule Take 1 capsule (60 mg total) by mouth daily. 90 capsule 3  . fluticasone (FLONASE) 50 MCG/ACT nasal spray USE 2 SPRAYS INTO EACH NOSTRIL EVERY DAY 48 g 3  . losartan (COZAAR) 100 MG tablet Take 1 tablet (100 mg total) by mouth daily. 90 tablet 3  . meloxicam (MOBIC) 15 MG tablet Take 1 tablet (15 mg total) by mouth daily. 90 tablet 3  . montelukast (SINGULAIR) 10 MG tablet Take 1 tablet (10 mg total) by mouth at bedtime. 90 tablet 3  . traMADol (ULTRAM) 50 MG tablet Take 50 mg by mouth every 6 (six) hours as needed. Reported on 05/23/2015    . valACYclovir (VALTREX) 500 MG tablet Take 1 tablet (500 mg total) by mouth daily as needed (for break outs). 30 tablet 0  . zolpidem (AMBIEN) 5 MG tablet Take 1 tablet (5 mg total) by mouth at bedtime as needed for sleep. 30 tablet 3   No current facility-administered medications on file prior to visit.    Allergies  Allergen Reactions  . Morphine Sulfate Other (See Comments)    Makes her hyper      Review of Systems:  ( Completed via adult medical history intake form today ) General:  Denies fever, chills, appetite changes, unexplained weight loss.  Respiratory: Denies SOB, DOE, cough, wheezing.  Cardiovascular: Denies chest pain, palpitations.  Gastrointestinal: Denies nausea, vomiting, diarrhea, abdominal pain.  Genitourinary: Denies dysuria, increased frequency, flank pain. Endocrine: Denies hot or cold intolerance, polyuria, polydipsia. Musculoskeletal: Denies myalgias, back pain, joint swelling, arthralgias, gait problems.  Skin: Denies pallor, rash, suspicious lesions.  Neurological: Denies dizziness, seizures, syncope, unexplained weakness, lightheadedness, numbness  and headaches.  Psychiatric/Behavioral: Denies mood changes, suicidal or homicidal ideations, hallucinations, sleep disturbances.    Objective:    Blood pressure 120/75, pulse 83. There is no weight on file to calculate BMI. General: Well Developed, well nourished, and in no acute distress.  HEENT: Normocephalic, atraumatic, pupils equal round reactive to light, neck supple, No carotid bruits no JVD Skin: Warm and dry, cap  RF less 2 sec Cardiac: Regular rate and rhythm, S1, S2 WNL's, no murmurs rubs or gallops Respiratory: ECTA B/L, Not using accessory muscles, speaking in full sentences. NeuroM-Sk: Ambulates w/o assistance, moves ext * 4 w/o difficulty, sensation grossly intact.  Psych: A and O *3, judgement and insight good.       Recent Results (from the past 2160 hour(s))  Comprehensive metabolic panel     Status: Abnormal   Collection Time: 07/25/15  4:15 PM  Result Value Ref Range   Sodium 141 135 - 145 mEq/L   Potassium 4.4 3.5 - 5.1 mEq/L   Chloride 103 96 - 112 mEq/L   CO2 29 19 - 32 mEq/L   Glucose, Bld 108 (H) 70 - 99 mg/dL   BUN 18 6 - 23 mg/dL   Creatinine, Ser 0.80 0.40 - 1.20 mg/dL   Total Bilirubin 0.4 0.2 - 1.2 mg/dL   Alkaline Phosphatase 63 39 - 117 U/L   AST 14 0 - 37 U/L   ALT 13 0 - 35 U/L   Total Protein 7.0 6.0 - 8.3 g/dL   Albumin 4.7 3.5 - 5.2 g/dL   Calcium 10.1 8.4 - 10.5 mg/dL   GFR 73.31 >60.00 mL/min  TSH     Status: None   Collection Time: 07/25/15  4:15 PM  Result Value Ref Range   TSH 1.69 0.35 - 4.50 uIU/mL  Pulmonary function test     Status: None (Preliminary result)   Collection Time: 08/18/15  9:00 AM  Result Value Ref Range   FVC-Pre 2.43 L   FVC-%Pred-Pre 98 %   FVC-Post 2.45 L   FVC-%Pred-Post 98 %   FVC-%Change-Post 0 %   FEV1-Pre 2.05 L   FEV1-%Pred-Pre 111 %   FEV1-Post 2.10 L   FEV1-%Pred-Post 113 %   FEV1-%Change-Post 2 %   FEV6-Pre 2.43 L   FEV6-%Pred-Pre 103 %   FEV6-Post 2.45 L   FEV6-%Pred-Post 104 %    FEV6-%Change-Post 0 %   Pre FEV1/FVC ratio 84 %   FEV1FVC-%Pred-Pre 113 %   Post FEV1/FVC ratio 86 %   FEV1FVC-%Change-Post 1 %   Pre FEV6/FVC Ratio 100 %   FEV6FVC-%Pred-Pre 106 %   Post FEV6/FVC ratio 100 %   FEV6FVC-%Pred-Post 106 %   FEV6FVC-%Change-Post 0 %   FEF 25-75 Pre 2.36 L/sec   FEF2575-%Pred-Pre 176 %   FEF 25-75 Post 2.41 L/sec   FEF2575-%Pred-Post 179 %   FEF2575-%Change-Post 1 %   RV 2.16 L   RV % pred 92 %   TLC 4.57 L   TLC % pred 93 %   DLCO unc 17.47 ml/min/mmHg   DLCO unc % pred 76 %   DLCO cor 17.02 ml/min/mmHg   DLCO cor % pred 74 %   DL/VA 4.12 ml/min/mmHg/L   DL/VA % pred 88 %  Urine Culture     Status: None   Collection Time: 08/18/15 10:47 AM  Result Value Ref Range   Colony Count NO GROWTH    Organism ID, Bacteria NO GROWTH   Urinalysis, Routine w reflex microscopic (not at [place])     Status: Abnormal   Collection Time: 08/18/15 10:47 AM  Result Value Ref Range   Color, Urine YELLOW Yellow;Lt. Yellow   APPearance CLEAR Clear   Specific Gravity, Urine 1.020 1.000-1.030   pH 6.0 5.0 - 8.0   Total Protein, Urine TRACE (A) Negative   Urine Glucose NEGATIVE Negative   Ketones, ur NEGATIVE Negative   Bilirubin Urine  NEGATIVE Negative   Hgb urine dipstick NEGATIVE Negative   Urobilinogen, UA 1.0 0.0 - 1.0   Leukocytes, UA TRACE (A) Negative   Nitrite POSITIVE (A) Negative   WBC, UA 0-2/hpf 0-2/hpf   RBC / HPF 0-2/hpf 0-2/hpf   Squamous Epithelial / LPF Rare(0-4/hpf) Rare(0-4/hpf)    Comment: transitional epith present   Renal Epithel, UA Rare(0-4/hpf) (A) None   Bacteria, UA Rare(<10/hpf) (A) None   Hyaline Casts, UA Presence of (A) None  Vitamin B12     Status: Abnormal   Collection Time: 08/25/15 11:02 AM  Result Value Ref Range   Vitamin B-12 188 (L) 211 - 911 pg/mL        Impression and Recommendations:    The patient was counselled, risk factors were discussed, anticipatory guidance given.   Fatigue Recent CBC done in March  2017 was completely within normal limits.  Currently being treated for B12 deficiency with injections under the direction of her neurologist, Dr. Posey Pronto   I explained to patient that I doubt her current symptoms of feeling unmotivated and extreme fatigue is related to lack of B12 or because her B12 injections went from daily dose down to once weekly.  We discussed various treatment options for her acute stress.    Patient does not wish to change her medications much but would rather focus on her sleep and keeping herself busy through reading, [place], church, and family/ friend gatherings.  We had a long discussion regarding side effects of benzodiazepines in the elderly and I explained that I prefer her to try melatonin for her sleep rather than Ambien.   Dosing suggested to patient.  Also suggest that patient consider the neuro psychological testing that Dr. Posey Pronto, her neurologist recently recommended in early June.  Patient has a strong family history of dementia and I explained that more Comprehensive Testing Is Often Needed to Discern Various Neuropsychological Diseases.  Patient did not wish to change around her mood medications today. She will continue Cymbalta at her current dose and if restoring her sleep patterns do not improve her mood and energy levels, we will address this in a follow-up visit in 2 weeks.   Of note, after the patient left and I was charting, I found this prior impression from one of my colleagues:  06/20/2015 Office Visit Written 06/20/2015 4:28 PM by Juanito Doom, MD   She has significant fatigue which frankly I think is related to all the stress she is having at home. However, I think it's worth working her up for obstructive sleep apnea if there is no evidence of worsening asthma based on her repeat PFTs and blood work which we have arranged for above.            Anxiety state Stable, denies current symptoms.  Again, due to patient age, recommend she avoid  Valium if possible  Reaction, situational, acute, to stress(husband dying) Throughout patient's life, it appears there been multiple incidences where she experiences extreme stress in the form of fatigue and feeling exhausted.  Please see AVS handed out to patient at the end of our visit for further patient instructions/ counseling done pertaining to today's office visit.  Pt was seen in the office today for 50+ minutes, with over 50% time spent in face the face counseling and coordination of care  Gross side effects, risk and benefits, and alternatives of medications discussed with patient.  Patient is aware that all medications have potential side effects and we are unable  to predict every sideeffect or drug-drug interaction that may occur.  Expresses verbal understanding and consents to current therapy plan and treatment regiment.  Note: This document was prepared using Dragon voice recognition software and may include unintentional dictation errors.

## 2015-09-21 NOTE — Assessment & Plan Note (Addendum)
Recent CBC done in March 2017 was completely within normal limits.  Currently being treated for B12 deficiency with injections under the direction of her neurologist, Dr. Posey Pronto   I explained to patient that I doubt her current symptoms of feeling unmotivated and extreme fatigue is related to lack of B12 or because her B12 injections went from daily dose down to once weekly.  We discussed various treatment options for her acute stress.    Patient does not wish to change her medications much but would rather focus on her sleep and keeping herself busy through reading, [place], church, and family/ friend gatherings.  We had a long discussion regarding side effects of benzodiazepines in the elderly and I explained that I prefer her to try melatonin for her sleep rather than Ambien.   Dosing suggested to patient.  Also suggest that patient consider the neuro psychological testing that Dr. Posey Pronto, her neurologist recently recommended in early June.  Patient has a strong family history of dementia and I explained that more Comprehensive Testing Is Often Needed to Discern Various Neuropsychological Diseases.  Patient did not wish to change around her mood medications today. She will continue Cymbalta at her current dose and if restoring her sleep patterns do not improve her mood and energy levels, we will address this in a follow-up visit in 2 weeks.   Of note, after the patient left and I was charting, I found this prior impression from one of my colleagues:  06/20/2015 Office Visit Written 06/20/2015 4:28 PM by Juanito Doom, MD   She has significant fatigue which frankly I think is related to all the stress she is having at home. However, I think it's worth working her up for obstructive sleep apnea if there is no evidence of worsening asthma based on her repeat PFTs and blood work which we have arranged for above.

## 2015-09-21 NOTE — Assessment & Plan Note (Addendum)
Stable, denies current symptoms.  Again, due to patient age, recommend she avoid Valium if possible

## 2015-09-25 ENCOUNTER — Telehealth: Payer: Self-pay | Admitting: Cardiovascular Disease

## 2015-09-25 NOTE — Telephone Encounter (Signed)
New Message  Pt requested to speak w/ Pharm about new medication she is going o start. Please call back and discuss.

## 2015-09-25 NOTE — Telephone Encounter (Signed)
Patient dose of amlodipine increased on June 26 to 10 mg daily.  Reports some slight swelling first day or two after increasing.  Then was fine until Friday June 30 developed edema from toes to knees, in both legs.  Held dose x 1 day then cut back to 5 mg daily.  States today edema is only trace, she can "live with that".    Home BP readings all WNL.  She also admits to some stress as her husband died in the past two weeks.    She will continue with amlodipine 5 mg daily and losartan 100 mg daily.  She can call if she notes a consistent trend to > 150, otherwise she can keep CVRR appt scheduled for August.  Patient voiced understanding.

## 2015-09-28 ENCOUNTER — Ambulatory Visit (INDEPENDENT_AMBULATORY_CARE_PROVIDER_SITE_OTHER): Payer: Medicare Other

## 2015-09-28 VITALS — BP 118/70 | HR 91 | Wt 152.0 lb

## 2015-09-28 DIAGNOSIS — E538 Deficiency of other specified B group vitamins: Secondary | ICD-10-CM

## 2015-09-28 MED ORDER — CYANOCOBALAMIN 1000 MCG/ML IJ SOLN
1000.0000 ug | Freq: Once | INTRAMUSCULAR | Status: AC
  Administered 2015-09-28: 1000 ug via INTRAMUSCULAR

## 2015-09-28 NOTE — Progress Notes (Signed)
   Subjective:    Patient ID: [name], female    DOB: 09-Mar-1934, 82 y.o.   MRN: [id number]  HPI  Pt is here for a vitamin B12 injection.  Pt denies gastrointestinal problems or dizziness.   Review of Systems     Objective:   Physical Exam        Assessment & Plan:   Patient tolerated injection well without complications.  Patient advised to schedule next injection in 7 days.

## 2015-09-29 DIAGNOSIS — M9905 Segmental and somatic dysfunction of pelvic region: Secondary | ICD-10-CM | POA: Diagnosis not present

## 2015-09-29 DIAGNOSIS — M5441 Lumbago with sciatica, right side: Secondary | ICD-10-CM | POA: Diagnosis not present

## 2015-09-29 DIAGNOSIS — M25551 Pain in right hip: Secondary | ICD-10-CM | POA: Diagnosis not present

## 2015-09-29 DIAGNOSIS — M9903 Segmental and somatic dysfunction of lumbar region: Secondary | ICD-10-CM | POA: Diagnosis not present

## 2015-09-29 DIAGNOSIS — M5136 Other intervertebral disc degeneration, lumbar region: Secondary | ICD-10-CM | POA: Diagnosis not present

## 2015-09-29 DIAGNOSIS — M9904 Segmental and somatic dysfunction of sacral region: Secondary | ICD-10-CM | POA: Diagnosis not present

## 2015-10-02 DIAGNOSIS — M9905 Segmental and somatic dysfunction of pelvic region: Secondary | ICD-10-CM | POA: Diagnosis not present

## 2015-10-02 DIAGNOSIS — M25551 Pain in right hip: Secondary | ICD-10-CM | POA: Diagnosis not present

## 2015-10-02 DIAGNOSIS — M5136 Other intervertebral disc degeneration, lumbar region: Secondary | ICD-10-CM | POA: Diagnosis not present

## 2015-10-02 DIAGNOSIS — M9903 Segmental and somatic dysfunction of lumbar region: Secondary | ICD-10-CM | POA: Diagnosis not present

## 2015-10-02 DIAGNOSIS — M9904 Segmental and somatic dysfunction of sacral region: Secondary | ICD-10-CM | POA: Diagnosis not present

## 2015-10-02 DIAGNOSIS — M5441 Lumbago with sciatica, right side: Secondary | ICD-10-CM | POA: Diagnosis not present

## 2015-10-03 ENCOUNTER — Other Ambulatory Visit: Payer: Medicare Other

## 2015-10-04 DIAGNOSIS — M5136 Other intervertebral disc degeneration, lumbar region: Secondary | ICD-10-CM | POA: Diagnosis not present

## 2015-10-04 DIAGNOSIS — M5441 Lumbago with sciatica, right side: Secondary | ICD-10-CM | POA: Diagnosis not present

## 2015-10-04 DIAGNOSIS — M9903 Segmental and somatic dysfunction of lumbar region: Secondary | ICD-10-CM | POA: Diagnosis not present

## 2015-10-04 DIAGNOSIS — M25551 Pain in right hip: Secondary | ICD-10-CM | POA: Diagnosis not present

## 2015-10-04 DIAGNOSIS — M9904 Segmental and somatic dysfunction of sacral region: Secondary | ICD-10-CM | POA: Diagnosis not present

## 2015-10-04 DIAGNOSIS — M9905 Segmental and somatic dysfunction of pelvic region: Secondary | ICD-10-CM | POA: Diagnosis not present

## 2015-10-05 ENCOUNTER — Ambulatory Visit (INDEPENDENT_AMBULATORY_CARE_PROVIDER_SITE_OTHER): Payer: Medicare Other

## 2015-10-05 VITALS — BP 120/69 | HR 97 | Wt 153.9 lb

## 2015-10-05 DIAGNOSIS — E538 Deficiency of other specified B group vitamins: Secondary | ICD-10-CM | POA: Diagnosis not present

## 2015-10-05 MED ORDER — CYANOCOBALAMIN 1000 MCG/ML IJ SOLN
1000.0000 ug | Freq: Once | INTRAMUSCULAR | Status: AC
  Administered 2015-10-05: 1000 ug via INTRAMUSCULAR

## 2015-10-05 NOTE — Progress Notes (Signed)
   Subjective:    Patient ID: [name], female    DOB: Apr 02, 1933, 82 y.o.   MRN: [id number]  HPI  Pt is here for a vitamin B12 injection.  Pt denies gastrointestinal problems or dizziness.   Review of Systems     Objective:   Physical Exam        Assessment & Plan:   Patient tolerated injection well without complications.  Patient advised to schedule next injection in 30 days.

## 2015-10-05 NOTE — Progress Notes (Deleted)
Pt is here for a vitamin B12 injection.  Pt denies gastrointestinal problems or dizziness.  Charyl Bigger, CMA

## 2015-10-09 DIAGNOSIS — M9905 Segmental and somatic dysfunction of pelvic region: Secondary | ICD-10-CM | POA: Diagnosis not present

## 2015-10-09 DIAGNOSIS — M9904 Segmental and somatic dysfunction of sacral region: Secondary | ICD-10-CM | POA: Diagnosis not present

## 2015-10-09 DIAGNOSIS — M5136 Other intervertebral disc degeneration, lumbar region: Secondary | ICD-10-CM | POA: Diagnosis not present

## 2015-10-09 DIAGNOSIS — M25551 Pain in right hip: Secondary | ICD-10-CM | POA: Diagnosis not present

## 2015-10-09 DIAGNOSIS — M5441 Lumbago with sciatica, right side: Secondary | ICD-10-CM | POA: Diagnosis not present

## 2015-10-09 DIAGNOSIS — M9903 Segmental and somatic dysfunction of lumbar region: Secondary | ICD-10-CM | POA: Diagnosis not present

## 2015-10-11 DIAGNOSIS — M9903 Segmental and somatic dysfunction of lumbar region: Secondary | ICD-10-CM | POA: Diagnosis not present

## 2015-10-11 DIAGNOSIS — M9905 Segmental and somatic dysfunction of pelvic region: Secondary | ICD-10-CM | POA: Diagnosis not present

## 2015-10-11 DIAGNOSIS — M5441 Lumbago with sciatica, right side: Secondary | ICD-10-CM | POA: Diagnosis not present

## 2015-10-11 DIAGNOSIS — M25551 Pain in right hip: Secondary | ICD-10-CM | POA: Diagnosis not present

## 2015-10-11 DIAGNOSIS — M5136 Other intervertebral disc degeneration, lumbar region: Secondary | ICD-10-CM | POA: Diagnosis not present

## 2015-10-11 DIAGNOSIS — M9904 Segmental and somatic dysfunction of sacral region: Secondary | ICD-10-CM | POA: Diagnosis not present

## 2015-10-13 DIAGNOSIS — M25551 Pain in right hip: Secondary | ICD-10-CM | POA: Diagnosis not present

## 2015-10-13 DIAGNOSIS — M5136 Other intervertebral disc degeneration, lumbar region: Secondary | ICD-10-CM | POA: Diagnosis not present

## 2015-10-13 DIAGNOSIS — M9905 Segmental and somatic dysfunction of pelvic region: Secondary | ICD-10-CM | POA: Diagnosis not present

## 2015-10-13 DIAGNOSIS — M9904 Segmental and somatic dysfunction of sacral region: Secondary | ICD-10-CM | POA: Diagnosis not present

## 2015-10-13 DIAGNOSIS — M5441 Lumbago with sciatica, right side: Secondary | ICD-10-CM | POA: Diagnosis not present

## 2015-10-13 DIAGNOSIS — M9903 Segmental and somatic dysfunction of lumbar region: Secondary | ICD-10-CM | POA: Diagnosis not present

## 2015-10-16 ENCOUNTER — Telehealth: Payer: Self-pay | Admitting: Pulmonary Disease

## 2015-10-16 DIAGNOSIS — M5441 Lumbago with sciatica, right side: Secondary | ICD-10-CM | POA: Diagnosis not present

## 2015-10-16 DIAGNOSIS — M5136 Other intervertebral disc degeneration, lumbar region: Secondary | ICD-10-CM | POA: Diagnosis not present

## 2015-10-16 DIAGNOSIS — M9903 Segmental and somatic dysfunction of lumbar region: Secondary | ICD-10-CM | POA: Diagnosis not present

## 2015-10-16 DIAGNOSIS — M9904 Segmental and somatic dysfunction of sacral region: Secondary | ICD-10-CM | POA: Diagnosis not present

## 2015-10-16 DIAGNOSIS — M25551 Pain in right hip: Secondary | ICD-10-CM | POA: Diagnosis not present

## 2015-10-16 DIAGNOSIS — M9905 Segmental and somatic dysfunction of pelvic region: Secondary | ICD-10-CM | POA: Diagnosis not present

## 2015-10-16 NOTE — Telephone Encounter (Signed)
Called spoke with pt. She states that she is calling for a refill on her Flonase. I explained to her that Dr. Katheren Puller office she states that she will contact her PCP for the refill. She voiced understanding and had no further questions. Nothing further needed.

## 2015-10-18 DIAGNOSIS — M9904 Segmental and somatic dysfunction of sacral region: Secondary | ICD-10-CM | POA: Diagnosis not present

## 2015-10-18 DIAGNOSIS — M9903 Segmental and somatic dysfunction of lumbar region: Secondary | ICD-10-CM | POA: Diagnosis not present

## 2015-10-18 DIAGNOSIS — M25551 Pain in right hip: Secondary | ICD-10-CM | POA: Diagnosis not present

## 2015-10-18 DIAGNOSIS — Z961 Presence of intraocular lens: Secondary | ICD-10-CM | POA: Diagnosis not present

## 2015-10-18 DIAGNOSIS — H40013 Open angle with borderline findings, low risk, bilateral: Secondary | ICD-10-CM | POA: Diagnosis not present

## 2015-10-18 DIAGNOSIS — M9905 Segmental and somatic dysfunction of pelvic region: Secondary | ICD-10-CM | POA: Diagnosis not present

## 2015-10-18 DIAGNOSIS — M5136 Other intervertebral disc degeneration, lumbar region: Secondary | ICD-10-CM | POA: Diagnosis not present

## 2015-10-18 DIAGNOSIS — M5441 Lumbago with sciatica, right side: Secondary | ICD-10-CM | POA: Diagnosis not present

## 2015-10-18 LAB — HM DIABETES EYE EXAM

## 2015-10-20 DIAGNOSIS — M9904 Segmental and somatic dysfunction of sacral region: Secondary | ICD-10-CM | POA: Diagnosis not present

## 2015-10-20 DIAGNOSIS — M5441 Lumbago with sciatica, right side: Secondary | ICD-10-CM | POA: Diagnosis not present

## 2015-10-20 DIAGNOSIS — M25551 Pain in right hip: Secondary | ICD-10-CM | POA: Diagnosis not present

## 2015-10-20 DIAGNOSIS — M9905 Segmental and somatic dysfunction of pelvic region: Secondary | ICD-10-CM | POA: Diagnosis not present

## 2015-10-20 DIAGNOSIS — M5136 Other intervertebral disc degeneration, lumbar region: Secondary | ICD-10-CM | POA: Diagnosis not present

## 2015-10-20 DIAGNOSIS — M9903 Segmental and somatic dysfunction of lumbar region: Secondary | ICD-10-CM | POA: Diagnosis not present

## 2015-10-23 DIAGNOSIS — M5136 Other intervertebral disc degeneration, lumbar region: Secondary | ICD-10-CM | POA: Diagnosis not present

## 2015-10-23 DIAGNOSIS — M9904 Segmental and somatic dysfunction of sacral region: Secondary | ICD-10-CM | POA: Diagnosis not present

## 2015-10-23 DIAGNOSIS — M25551 Pain in right hip: Secondary | ICD-10-CM | POA: Diagnosis not present

## 2015-10-23 DIAGNOSIS — M9905 Segmental and somatic dysfunction of pelvic region: Secondary | ICD-10-CM | POA: Diagnosis not present

## 2015-10-23 DIAGNOSIS — M5441 Lumbago with sciatica, right side: Secondary | ICD-10-CM | POA: Diagnosis not present

## 2015-10-23 DIAGNOSIS — M9903 Segmental and somatic dysfunction of lumbar region: Secondary | ICD-10-CM | POA: Diagnosis not present

## 2015-10-26 DIAGNOSIS — M9903 Segmental and somatic dysfunction of lumbar region: Secondary | ICD-10-CM | POA: Diagnosis not present

## 2015-10-26 DIAGNOSIS — M9904 Segmental and somatic dysfunction of sacral region: Secondary | ICD-10-CM | POA: Diagnosis not present

## 2015-10-26 DIAGNOSIS — M5441 Lumbago with sciatica, right side: Secondary | ICD-10-CM | POA: Diagnosis not present

## 2015-10-26 DIAGNOSIS — M25551 Pain in right hip: Secondary | ICD-10-CM | POA: Diagnosis not present

## 2015-10-26 DIAGNOSIS — M9905 Segmental and somatic dysfunction of pelvic region: Secondary | ICD-10-CM | POA: Diagnosis not present

## 2015-10-26 DIAGNOSIS — M5136 Other intervertebral disc degeneration, lumbar region: Secondary | ICD-10-CM | POA: Diagnosis not present

## 2015-10-30 DIAGNOSIS — M25551 Pain in right hip: Secondary | ICD-10-CM | POA: Diagnosis not present

## 2015-10-30 DIAGNOSIS — M5441 Lumbago with sciatica, right side: Secondary | ICD-10-CM | POA: Diagnosis not present

## 2015-10-30 DIAGNOSIS — M5136 Other intervertebral disc degeneration, lumbar region: Secondary | ICD-10-CM | POA: Diagnosis not present

## 2015-10-30 DIAGNOSIS — M9905 Segmental and somatic dysfunction of pelvic region: Secondary | ICD-10-CM | POA: Diagnosis not present

## 2015-10-30 DIAGNOSIS — M9903 Segmental and somatic dysfunction of lumbar region: Secondary | ICD-10-CM | POA: Diagnosis not present

## 2015-10-30 DIAGNOSIS — M9904 Segmental and somatic dysfunction of sacral region: Secondary | ICD-10-CM | POA: Diagnosis not present

## 2015-11-02 ENCOUNTER — Ambulatory Visit (INDEPENDENT_AMBULATORY_CARE_PROVIDER_SITE_OTHER): Payer: Medicare Other | Admitting: Pharmacist Clinician (PhC)/ Clinical Pharmacy Specialist

## 2015-11-02 DIAGNOSIS — M9903 Segmental and somatic dysfunction of lumbar region: Secondary | ICD-10-CM | POA: Diagnosis not present

## 2015-11-02 DIAGNOSIS — M9905 Segmental and somatic dysfunction of pelvic region: Secondary | ICD-10-CM | POA: Diagnosis not present

## 2015-11-02 DIAGNOSIS — M9904 Segmental and somatic dysfunction of sacral region: Secondary | ICD-10-CM | POA: Diagnosis not present

## 2015-11-02 DIAGNOSIS — M25551 Pain in right hip: Secondary | ICD-10-CM | POA: Diagnosis not present

## 2015-11-02 DIAGNOSIS — I10 Essential (primary) hypertension: Secondary | ICD-10-CM

## 2015-11-02 DIAGNOSIS — M5441 Lumbago with sciatica, right side: Secondary | ICD-10-CM | POA: Diagnosis not present

## 2015-11-02 DIAGNOSIS — M5136 Other intervertebral disc degeneration, lumbar region: Secondary | ICD-10-CM | POA: Diagnosis not present

## 2015-11-02 NOTE — Patient Instructions (Signed)
  Your blood pressure today is 130/72  (goal is < 150/90)   Check your blood pressure at home several times each week and keep record of the readings.  Take your BP meds as follows: continue with amlodipine 5 mg each morning (if your home BP continues to stay up, just re-order the 5 mg tablets).  Also continue with losartan  Bring all of your meds, your BP cuff and your record of home blood pressures to your next appointment.  Exercise as you're able, try to walk approximately 30 minutes per day.  Keep salt intake to a minimum, especially watch canned and prepared boxed foods.  Eat more fresh fruits and vegetables and fewer canned items.  Avoid eating in fast food restaurants.    HOW TO TAKE YOUR BLOOD PRESSURE: . Rest 5 minutes before taking your blood pressure. .  Don't smoke or drink caffeinated beverages for at least 30 minutes before. . Take your blood pressure before (not after) you eat. . Sit comfortably with your back supported and both feet on the floor (don't cross your legs). . Elevate your arm to heart level on a table or a desk. . Use the proper sized cuff. It should fit smoothly and snugly around your bare upper arm. There should be enough room to slip a fingertip under the cuff. The bottom edge of the cuff should be 1 inch above the crease of the elbow. . Ideally, take 3 measurements at one sitting and record the average.

## 2015-11-03 ENCOUNTER — Encounter: Payer: Self-pay | Admitting: Pharmacist Clinician (PhC)/ Clinical Pharmacy Specialist

## 2015-11-03 MED ORDER — AMLODIPINE BESYLATE 5 MG PO TABS: 5.0000 mg | ORAL_TABLET | Freq: Every day | ORAL | 3 refills | Status: DC

## 2015-11-03 NOTE — Progress Notes (Signed)
Patient ID: [name]                 DOB: 02/24/1934                      MRN: [id number]     HPI: [name] is a 82 y.o. female referred by Dr. [name] to HTN clinic for follow up.   Her cardiac history includes HTN, HLD and LBBB. Dr. [name] started her on amlodipine 5 mg, which was then increased to 10 mg in June.  Several days after the dose increase she developed severe swelling and redness in both lower extremities.  Her dose was cut back to 5 mg and she has been doing well since then.  She was also recently found to be vitamin B12 deficient and has been receiving shots to replete her supplies.   Since her last visit with [name], her husband passed away from Parkinson's Dementia.  She states she is dealing with it fairly well, but after 56 years of marriage admits that some days are still very hard.     Current HTN meds:  Amlodipine 5mg   Losartan 100 mg in the evening  BP goal: <140/90 - diabetes  Family History: no cardiac history that she is aware. One sister has high cholesterol she thinks.   Social History: smoked "recreationally" in 60s and 68s. Quit at 82 years old. Never used smokeless tobacco and denies alcohol  Diet: She eats all of her meals at home.. Everything is cooked fresh occasionally using seasonings or salts/butter. She endorses 2-3 diet pepsi bottles a day. She also drinks 1 cup of coffee black each day.   Exercise: She is currently not exercising   Home BP readings: She has a cuff that is greater than 2 years old.  Of 71 readings taken in July and August, 26 were > [id number] systolic and of those only 4 > 150, the highest being 155.  Diastolic readings all WNL.  Interestingly, her pressure has increased in the past 4-5 days, and the only difference she notes is that she ran out of amlodipine 5 mg tablets and is cutting some 10 mg tablets in half.   Morning readings are 150/60-70s Evening readings are 120-130/60s  Wt Readings from Last 3 Encounters:  11/02/15 150  lb 9.6 oz (68.3 kg)  10/05/15 153 lb 14.4 oz (69.8 kg)  09/28/15 152 lb (68.9 kg)   BP Readings from Last 3 Encounters:  11/02/15 130/72  10/05/15 120/69  09/28/15 118/70   Pulse Readings from Last 3 Encounters:  10/05/15 97  09/28/15 91  09/21/15 83    Renal function: CrCl cannot be calculated (Patient's most recent lab result is older than the maximum 21 days allowed.).  Past Medical History:  Diagnosis Date  . ALLERGIC RHINITIS   . ANEMIA, IRON DEFICIENCY   . ANXIETY   . ARTHRITIS    s/p bilateral THR  . ASTHMA, EXTRINSIC   . Bronchitis   . Cataracts, both eyes   . Chronic constipation   . DIABETES MELLITUS, TYPE II   . FIBROMYALGIA    fibromyalgia  . G E R D   . High cholesterol   . HYPERLIPIDEMIA   . HYPERTENSION   . Interstitial cystitis   . MIGRAINE HEADACHE     Current Outpatient Prescriptions on File Prior to Visit  Medication Sig Dispense Refill  . acetaminophen (TYLENOL) 500 MG tablet Take 1,000 mg by mouth every 6 (six)  hours as needed. For pain    . albuterol (PROAIR HFA) 108 (90 BASE) MCG/ACT inhaler Inhale 2 puffs into the lungs every 6 (six) hours as needed. 3 Inhaler 3  . amLODipine (NORVASC) 10 MG tablet Take 1 tablet (10 mg total) by mouth daily. 180 tablet 3  . budesonide-formoterol (SYMBICORT) 80-4.5 MCG/ACT inhaler Inhale 2 puffs into the lungs 2 (two) times daily. 1 Inhaler 12  . cetirizine (ZYRTEC) 10 MG tablet Take 10 mg by mouth daily.     . Cholecalciferol (VITAMIN D3) 1000 UNITS CAPS Take 1 capsule by mouth daily.     . cyanocobalamin (,VITAMIN B-12,) 1000 MCG/ML injection Inject 1 mL (1,000 mcg total) into the muscle once. 1 mL 0  . diazepam (VALIUM) 5 MG tablet Take 1 tablet (5 mg total) by mouth every 12 (twelve) hours as needed. for anxiety 60 tablet 1  . Docusate Sodium (STOOL SOFTENER) 100 MG capsule Take 100 mg by mouth daily.      . DULoxetine (CYMBALTA) 60 MG capsule Take 1 capsule (60 mg total) by mouth daily. 90 capsule 3  .  fluticasone (FLONASE) 50 MCG/ACT nasal spray USE 2 SPRAYS INTO EACH NOSTRIL EVERY DAY 48 g 3  . losartan (COZAAR) 100 MG tablet Take 1 tablet (100 mg total) by mouth daily. 90 tablet 3  . meloxicam (MOBIC) 15 MG tablet Take 1 tablet (15 mg total) by mouth daily. 90 tablet 3  . montelukast (SINGULAIR) 10 MG tablet Take 1 tablet (10 mg total) by mouth at bedtime. 90 tablet 3  . traMADol (ULTRAM) 50 MG tablet Take 50 mg by mouth every 6 (six) hours as needed. Reported on 05/23/2015    . valACYclovir (VALTREX) 500 MG tablet Take 1 tablet (500 mg total) by mouth daily as needed (for break outs). 30 tablet 0  . zolpidem (AMBIEN) 5 MG tablet Take 1 tablet (5 mg total) by mouth at bedtime as needed for sleep. 30 tablet 3   No current facility-administered medications on file prior to visit.     Allergies  Allergen Reactions  . Morphine Sulfate Other (See Comments)    Makes her hyper   .   Thank you, Tommy Medal, PharmD CPP [place]

## 2015-11-03 NOTE — Assessment & Plan Note (Addendum)
BP much improved.  She is still getting some readings greater than her [id number] systolic goal, but with the highest being only in the 150's, my inclination is to leave her medication alone.  If she continues to have elevated pressure with cutting 10 mg tablets, I suggested that she just go to the drugstore and refill her 5 mg tablet.  She will call if she has any further elevated readings.    She asked about getting DNR and MOST (medical orders for scope of treatment) forms signed by Dr. [name].  She is seeing her PCP next week for a physical and I suggested that she get the forms signed while she was there.  If that office does not have the paperwork, she can call and we will try to get it for her.

## 2015-11-07 ENCOUNTER — Ambulatory Visit (INDEPENDENT_AMBULATORY_CARE_PROVIDER_SITE_OTHER): Payer: Medicare Other | Admitting: Family Medicine

## 2015-11-07 ENCOUNTER — Encounter: Payer: Self-pay | Admitting: Family Medicine

## 2015-11-07 VITALS — BP 137/72 | HR 94 | Temp 98.0°F | Resp 20 | Wt 152.0 lb

## 2015-11-07 DIAGNOSIS — R05 Cough: Secondary | ICD-10-CM | POA: Diagnosis not present

## 2015-11-07 DIAGNOSIS — J4530 Mild persistent asthma, uncomplicated: Secondary | ICD-10-CM | POA: Diagnosis not present

## 2015-11-07 DIAGNOSIS — J209 Acute bronchitis, unspecified: Secondary | ICD-10-CM

## 2015-11-07 DIAGNOSIS — R059 Cough, unspecified: Secondary | ICD-10-CM

## 2015-11-07 DIAGNOSIS — R062 Wheezing: Secondary | ICD-10-CM | POA: Diagnosis not present

## 2015-11-07 MED ORDER — PREDNISONE 10 MG (48) PO TBPK: ORAL_TABLET | Freq: Every day | ORAL | 0 refills | Status: DC

## 2015-11-07 MED ORDER — METHYLPREDNISOLONE SODIUM SUCC 125 MG IJ SOLR
125.0000 mg | Freq: Once | INTRAMUSCULAR | Status: AC
Start: 2015-11-07 — End: 2015-11-07
  Administered 2015-11-07: 125 mg via INTRAMUSCULAR

## 2015-11-07 NOTE — Patient Instructions (Signed)
Use Mucinex DM over-the-counter medicine daily and Delsym cough medicine as needed.  We will hold off on antibiotics until your follow-up office visit in 2 days. Start the steroids tomorrow morning since she got an injection of steroids today.      Acute Bronchitis Bronchitis is inflammation of the airways that extend from the windpipe into the lungs (bronchi). The inflammation often causes mucus to develop. This leads to a cough, which is the most common symptom of bronchitis.  In acute bronchitis, the condition usually develops suddenly and goes away over time, usually in a couple weeks. Smoking, allergies, and asthma can make bronchitis worse. Repeated episodes of bronchitis may cause further lung problems.  CAUSES Acute bronchitis is most often caused by the same virus that causes a cold. The virus can spread from person to person (contagious) through coughing, sneezing, and touching contaminated objects. SIGNS AND SYMPTOMS   Cough.   Fever.   Coughing up mucus.   Body aches.   Chest congestion.   Chills.   Shortness of breath.   Sore throat.  DIAGNOSIS  Acute bronchitis is usually diagnosed through a physical exam. Your health care provider will also ask you questions about your medical history. Tests, such as chest X-rays, are sometimes done to rule out other conditions.  TREATMENT  Acute bronchitis usually goes away in a couple weeks. Oftentimes, no medical treatment is necessary. Medicines are sometimes given for relief of fever or cough. Antibiotic medicines are usually not needed but may be prescribed in certain situations. In some cases, an inhaler may be recommended to help reduce shortness of breath and control the cough. A cool mist vaporizer may also be used to help thin bronchial secretions and make it easier to clear the chest.  HOME CARE INSTRUCTIONS  Get plenty of rest.   Drink enough fluids to keep your urine clear or pale yellow (unless you have a  medical condition that requires fluid restriction). Increasing fluids may help thin your respiratory secretions (sputum) and reduce chest congestion, and it will prevent dehydration.   Take medicines only as directed by your health care provider.  If you were prescribed an antibiotic medicine, finish it all even if you start to feel better.  Avoid smoking and secondhand smoke. Exposure to cigarette smoke or irritating chemicals will make bronchitis worse. If you are a smoker, consider using nicotine gum or skin patches to help control withdrawal symptoms. Quitting smoking will help your lungs heal faster.   Reduce the chances of another bout of acute bronchitis by washing your hands frequently, avoiding people with cold symptoms, and trying not to touch your hands to your mouth, nose, or eyes.   Keep all follow-up visits as directed by your health care provider.  SEEK MEDICAL CARE IF: Your symptoms do not improve after 1 week of treatment.  SEEK IMMEDIATE MEDICAL CARE IF:  You develop an increased fever or chills.   You have chest pain.   You have severe shortness of breath.  You have bloody sputum.   You develop dehydration.  You faint or repeatedly feel like you are going to pass out.  You develop repeated vomiting.  You develop a severe headache. MAKE SURE YOU:   Understand these instructions.  Will watch your condition.  Will get help right away if you are not doing well or get worse.   This information is not intended to replace advice given to you by your health care provider. Make sure you discuss any  questions you have with your health care provider.   Document Released: 04/18/2004 Document Revised: 04/01/2014 Document Reviewed: 09/01/2012 Elsevier Interactive Patient Education [employer].

## 2015-11-07 NOTE — Progress Notes (Signed)
Impression and Recommendations:    1. Acute bronchitis, unspecified organism   2. Cough   3. subjective Wheeze   4. Asthma, mild persistent, uncomplicated      Reassured patient that lungs sound clear today on exam.   Use Mucinex DM over-the-counter medicine daily and Delsym cough medicine as needed.  Drink more water   We will hold off on antibiotics until your follow-up office visit in 2 days- start only if you're not beginning to turn around.      Depomedrol 40mg  IM today- this is per patient's preference.   Start the oral steroids tomorrow morning since she got an injection of steroids today.  She understands these can raise blood pressure, blood sugar and possibly increase her anxiety state.  Voices verbal understanding and still would like the medicine.   The patient was counseled, risk factors/ red flag symptoms were discussed, and anticipatory guidance given.   Gross side effects, risk and benefits, and alternatives of medications discussed with patient.  Patient is aware that all medications have potential side effects and we are unable to predict every side effect or drug-drug interaction that may occur.  Expresses verbal understanding and consents to current therapy plan and treatment regimen.    Patient's Medications  New Prescriptions   PREDNISONE (STERAPRED UNI-PAK 48 TAB) 10 MG (48) TBPK TABLET    Take by mouth daily. 12 day dosepack po  Previous Medications   ACETAMINOPHEN (TYLENOL) 500 MG TABLET    Take 1,000 mg by mouth every 6 (six) hours as needed. For pain   ALBUTEROL (PROAIR HFA) 108 (90 BASE) MCG/ACT INHALER    Inhale 2 puffs into the lungs every 6 (six) hours as needed.   AMLODIPINE (NORVASC) 5 MG TABLET    Take 1 tablet (5 mg total) by mouth daily.   BUDESONIDE-FORMOTEROL (SYMBICORT) 80-4.5 MCG/ACT INHALER    Inhale 2 puffs into the lungs 2 (two) times daily.   CETIRIZINE (ZYRTEC) 10 MG TABLET    Take 10 mg by mouth daily.    CHOLECALCIFEROL  (VITAMIN D3) 1000 UNITS CAPS    Take 1 capsule by mouth daily.    DOCUSATE SODIUM (STOOL SOFTENER) 100 MG CAPSULE    Take 100 mg by mouth daily.     DULOXETINE (CYMBALTA) 60 MG CAPSULE    Take 1 capsule (60 mg total) by mouth daily.   FLUTICASONE (FLONASE) 50 MCG/ACT NASAL SPRAY    USE 2 SPRAYS INTO EACH NOSTRIL EVERY DAY   LOSARTAN (COZAAR) 100 MG TABLET    Take 1 tablet (100 mg total) by mouth daily.   MELOXICAM (MOBIC) 15 MG TABLET    Take 1 tablet (15 mg total) by mouth daily.   MONTELUKAST (SINGULAIR) 10 MG TABLET    Take 1 tablet (10 mg total) by mouth at bedtime.   TRAMADOL (ULTRAM) 50 MG TABLET    Take 50 mg by mouth every 6 (six) hours as needed. Reported on 05/23/2015   VALACYCLOVIR (VALTREX) 500 MG TABLET    Take 1 tablet (500 mg total) by mouth daily as needed (for break outs).  Modified Medications   Modified Medication Previous Medication   DIAZEPAM (VALIUM) 5 MG TABLET diazepam (VALIUM) 5 MG tablet      1/2 tab q 12 hrs PRN SEVERE panic anxiety    Take 1 tablet (5 mg total) by mouth every 12 (twelve) hours as needed. for anxiety  Discontinued Medications   CYANOCOBALAMIN (,VITAMIN B-12,) 1000 MCG/ML  INJECTION    Inject 1 mL (1,000 mcg total) into the muscle once.   ZOLPIDEM (AMBIEN) 5 MG TABLET    Take 1 tablet (5 mg total) by mouth at bedtime as needed for sleep.    Return in about 2 days (around 11/09/2015) for Already has appointment scheduled.  Please see AVS handed out to patient at the end of our visit for further patient instructions/ counseling done pertaining to today's office visit.    Note: This document was prepared using Dragon voice recognition software and may include unintentional dictation errors.   --------------------------------------------------------------------------------------------------------------------------------------------------------------------------------------------------------------------------------------------    Subjective:    CC:    Chief Complaint  Patient presents with  . Cough    [name] reports chest congestin and cough with green sputum for 3 days. Denies fever, chills or sweats.     HPI: [name] is a 82 y.o. female who presents to [facility] today for Same day office visit for acute URI symptoms.   Sx started 4 days ago-  tightness in chest, dry hacking cough- worse at night, now productive- thick yellowish.  NO F/C.  + sob/ wh- gets this 2-3 times per year.     Nose- clr, sinuses congested but clear, no one-sided face pain   Tried: Nothing  Wt Readings from Last 3 Encounters:  11/09/15 149 lb 1.6 oz (67.6 kg)  11/07/15 152 lb (68.9 kg)  11/02/15 150 lb 9.6 oz (68.3 kg)   BP Readings from Last 3 Encounters:  11/09/15 121/69  11/07/15 137/72  11/02/15 130/72   Pulse Readings from Last 3 Encounters:  11/09/15 88  11/07/15 94  10/05/15 97     Patient Active Problem List   Diagnosis Date Noted  . Diabetes mellitus type 2, uncomplicated ([place]) [id number]    Priority: High  . Asthma, mild persistent 12/19/2008    Priority: High  . Hyperlipidemia 08/12/2007    Priority: High  . Essential hypertension 08/12/2007    Priority: High  . B12 deficiency 08/30/2015    Priority: Medium  . Iron deficiency anemia 11/03/2009    Priority: Medium  . Anxiety state 08/12/2007    Priority: Medium  . FIBROMYALGIA 08/12/2007    Priority: Medium  . Fatigue 06/20/2015    Priority: Low  . Memory loss     Priority: Low  . Lumbar radiculopathy, chronic     Priority: Low  . LBBB (left bundle branch block) 11/13/2010    Priority: Low  . G E R D 08/12/2007    Priority: Low  . Reaction, situational, acute, to stress(husband dying) 09/21/2015  . Dysuria 08/18/2015  . Herpes simplex labialis 04/03/2012  . Shingles 04/12/2011  . MIGRAINE HEADACHE 05/30/2010  . Allergic rhinitis 08/12/2007  . ARTHRITIS 08/12/2007    Past Medical history, Surgical history, Family history,  Social history, Allergies and Medications have been entered into the medical record, reviewed and changed as needed.   Allergies:  Allergies  Allergen Reactions  . Morphine Sulfate Other (See Comments)    Makes her hyper    Review of Systems: No fever/ chills, night sweats, no unintended weight loss, No chest pain, or increased shortness of breath. No N/V/D.  Pertinent positives and negatives noted in HPI above    Objective:   Blood pressure 137/72, pulse 94, temperature 98 F (36.7 C), temperature source Oral, resp. rate 20, weight 152 lb (68.9 kg), SpO2 98 %. Body mass index is 26.93 kg/m.  General: Well Developed,  well nourished, appropriate for stated age.  Neuro: Alert and oriented x3, extra-ocular muscles intact, sensation grossly intact.  HEENT: Normocephalic, atraumatic, PERRLA, EOMI, no cervical lymphadenopathy, TMs clear bilaterally, no tenderness to palpation of sinuses, neck supple   Skin: Warm and dry, no gross rash. Cardiac: RRR, S1 S2,  no murmurs rubs or gallops.  Respiratory: ECTA B/L, No wheezes, rhonchi or rales. Not using accessory muscles, speaking in full sentences-unlabored. Vascular:  No gross lower ext edema, cap RF less 2 sec. Psych: No SI/HI, Insight and judgement good

## 2015-11-09 ENCOUNTER — Ambulatory Visit (INDEPENDENT_AMBULATORY_CARE_PROVIDER_SITE_OTHER): Payer: Medicare Other | Admitting: Family Medicine

## 2015-11-09 ENCOUNTER — Encounter: Payer: Self-pay | Admitting: Family Medicine

## 2015-11-09 VITALS — BP 121/69 | HR 88 | Wt 149.1 lb

## 2015-11-09 DIAGNOSIS — Z0000 Encounter for general adult medical examination without abnormal findings: Secondary | ICD-10-CM | POA: Diagnosis not present

## 2015-11-09 DIAGNOSIS — E785 Hyperlipidemia, unspecified: Secondary | ICD-10-CM | POA: Diagnosis not present

## 2015-11-09 DIAGNOSIS — Z8709 Personal history of other diseases of the respiratory system: Secondary | ICD-10-CM | POA: Diagnosis not present

## 2015-11-09 DIAGNOSIS — Z719 Counseling, unspecified: Secondary | ICD-10-CM

## 2015-11-09 DIAGNOSIS — J4530 Mild persistent asthma, uncomplicated: Secondary | ICD-10-CM | POA: Diagnosis not present

## 2015-11-09 DIAGNOSIS — E538 Deficiency of other specified B group vitamins: Secondary | ICD-10-CM

## 2015-11-09 DIAGNOSIS — E119 Type 2 diabetes mellitus without complications: Secondary | ICD-10-CM | POA: Diagnosis not present

## 2015-11-09 DIAGNOSIS — F430 Acute stress reaction: Secondary | ICD-10-CM

## 2015-11-09 DIAGNOSIS — I10 Essential (primary) hypertension: Secondary | ICD-10-CM

## 2015-11-09 LAB — POCT GLYCOSYLATED HEMOGLOBIN (HGB A1C): HEMOGLOBIN A1C: 6.2

## 2015-11-09 MED ORDER — CYANOCOBALAMIN 1000 MCG/ML IJ SOLN: 1000.0000 ug | Freq: Once | INTRAMUSCULAR | 0 refills | Status: DC

## 2015-11-09 MED ORDER — CYANOCOBALAMIN 1000 MCG/ML IJ SOLN
1000.0000 ug | Freq: Once | INTRAMUSCULAR | Status: AC
  Administered 2015-11-09: 1000 ug via INTRAMUSCULAR

## 2015-11-09 MED ORDER — DIAZEPAM 5 MG PO TABS: ORAL_TABLET | ORAL | 0 refills | Status: DC

## 2015-11-09 NOTE — Progress Notes (Signed)
Impression and Recommendations:    1. Encounter for health maintenance examination in adult   2. History of recent upper respiratory infection or influenza   3. Asthma, mild persistent, uncomplicated   4. B12 deficiency   5. Diabetes mellitus without complication ([place])   6. Essential hypertension   7. Hyperlipidemia   8. Reaction, situational, acute, to stress(husband dying)   9. Health education/counseling    -Resolving acute bronchitis.  Saw patient for 2 days ago.  Symptoms much improved but told to continue steroid pack til gone.  Reminded her that allergy season is right around the corner and she should be taking all her prevention medicines daily-which include Zyrtec, Flonase and Singulair, among others   1) Anticipatory Guidance: Discussed importance of wearing a seatbelt while driving, sunscreen when outside along with yearly skin surveillance; eating a well balanced and modest diet; physical activity at least 25 minutes per day or 150 min/ week of moderate to intense activity or as much as she can do.  - Patient lives alone after recently losing her husband of 55 years to Parkinson's dementia.  Feels ok about living home alone- has support system.   [name], CMA to review immunization status, do DM foot exam, end-of-life issues with patient today including DNR paperwork, living will etc, depression screen and home/personal safety questions including fall risk (multifactorial risk assessment) etc, etc.   - If these items were not addressed at today's office visit, they will be in the very near future in a separate encounter to be completed under cpt code [id number].     2) Immunizations / Screenings / Labs:  All immunizations and screenings that patient agrees to, are up-to-date per recommendations or will be updated in near future after all records are reviewed.  If we do not have those records, request was made to obtain them.   - Patient understands the needs for q 34mo  dental, and yearly skin, and yrly vision screens- esp for her DM eye exams-  which pt will schedule independently.   3) Weight:   BMI does indicate overweight. Improve nutrient density of diet through increasing intake of fruits and vegetables and decreasing saturated/trans fats, white flour products and refined sugar products.   Please see orders section below for further details of actions taken during this office visit. F-up preventative CPE in 1 year. F/up sooner for chronic care management as discussed and/or prn.  Please see orders placed and AVS handed out to patient at the end of our visit for further patient instructions/ counseling done pertaining to today's office visit.     Subjective:    Chief Complaint  Patient presents with  . Annual Exam   CC:   HPI: [name] is a 82 y.o. female who presents to [place] [facility] today a yearly health maintenance exam.  Patient's acute URI symptoms which I saw her for 2 days ago are much improved today. No wheeze\shortness of breath or difficulty breathing- has not needed her rescue inhaler.  Tolerating steroids well and decreasing cough. No further concerns today.  Told to finish out steroid pack I gave her.    Reminded her that allergy season is right around the corner and she should be taking all her prevention medicines daily-which include Zyrtec, Flonase and Singulair, among others  Health Maintenance Summary Reviewed and updated, unless pt declines services.  Alcohol:        No concerns, no excessive use  Exercise Habits:     Rare- doesn't much at all due to back STD concerns:    NONE Birth control method:    n/a Menses regular:     n/a Lumps or breast concerns:      no Breast Cancer Family History:      no Skin screenings: Sees Dr. Danella Sensing, dermatology   Vitamin B12     Status: Abnormal   Collection Time: 08/25/15 11:02 AM  Result Value Ref Range   Vitamin B-12 188 (L) 211 - 911 pg/mL     POCT A1c in office today-->     6.2   Wt Readings from Last 3 Encounters:  11/09/15 149 lb 1.6 oz (67.6 kg)  11/07/15 152 lb (68.9 kg)  11/02/15 150 lb 9.6 oz (68.3 kg)   BP Readings from Last 3 Encounters:  11/09/15 121/69  11/07/15 137/72  11/02/15 130/72   Pulse Readings from Last 3 Encounters:  11/09/15 88  11/07/15 94  10/05/15 97        Past Medical History:  Diagnosis Date  . ALLERGIC RHINITIS   . ANEMIA, IRON DEFICIENCY   . ANXIETY   . ARTHRITIS    s/p bilateral THR  . ASTHMA, EXTRINSIC   . Bronchitis   . Cataracts, both eyes   . Chronic constipation   . DIABETES MELLITUS, TYPE II   . FIBROMYALGIA    fibromyalgia  . G E R D   . High cholesterol   . HYPERLIPIDEMIA   . HYPERTENSION   . Interstitial cystitis   . MIGRAINE HEADACHE       Past Surgical History:  Procedure Laterality Date  . ABDOMINAL HYSTERECTOMY    . CATARACT EXTRACTION    . COLONOSCOPY    . DOPPLER ECHOCARDIOGRAPHY    . ESOPHAGOGASTRODUODENOSCOPY ENDOSCOPY    . LUMBAR DISC SURGERY    . NM MYOVIEW LTD     stress test  . Ovarian cyst removed    . TOTAL HIP ARTHROPLASTY        Family History  Problem Relation Age of Onset  . Arthritis Mother   . Hypertension Mother   . Heart disease Father   . Arthritis Father   . Hypertension Father   . Depression Sister   . Hyperlipidemia Sister   . Hypertension Other   . Hyperlipidemia Other       History  Drug Use No     History  Alcohol Use No     History  Smoking Status  . Former Smoker  . Packs/day: 0.30  . Years: 10.00  . Types: Cigarettes  . Quit date: 03/25/1968  Smokeless Tobacco  . Never Used    Comment: Married with two children. Retired Web designer     History  Sexual Activity  . Sexual activity: Not Currently      Current Outpatient Prescriptions on File Prior to Visit  Medication Sig Dispense Refill  . acetaminophen (TYLENOL) 500 MG tablet Take 1,000 mg by mouth every 6 (six) hours as needed.  For pain    . albuterol (PROAIR HFA) 108 (90 BASE) MCG/ACT inhaler Inhale 2 puffs into the lungs every 6 (six) hours as needed. 3 Inhaler 3  . amLODipine (NORVASC) 5 MG tablet Take 1 tablet (5 mg total) by mouth daily. 90 tablet 3  . budesonide-formoterol (SYMBICORT) 80-4.5 MCG/ACT inhaler Inhale 2 puffs into the lungs 2 (two) times daily. 1 Inhaler 12  . cetirizine (ZYRTEC) 10 MG tablet Take 10 mg by  mouth daily.     . Cholecalciferol (VITAMIN D3) 1000 UNITS CAPS Take 1 capsule by mouth daily.     Mariane Baumgarten Sodium (STOOL SOFTENER) 100 MG capsule Take 100 mg by mouth daily.      . DULoxetine (CYMBALTA) 60 MG capsule Take 1 capsule (60 mg total) by mouth daily. 90 capsule 3  . fluticasone (FLONASE) 50 MCG/ACT nasal spray USE 2 SPRAYS INTO EACH NOSTRIL EVERY DAY 48 g 3  . losartan (COZAAR) 100 MG tablet Take 1 tablet (100 mg total) by mouth daily. 90 tablet 3  . meloxicam (MOBIC) 15 MG tablet Take 1 tablet (15 mg total) by mouth daily. 90 tablet 3  . montelukast (SINGULAIR) 10 MG tablet Take 1 tablet (10 mg total) by mouth at bedtime. 90 tablet 3  . predniSONE (STERAPRED UNI-PAK 48 TAB) 10 MG (48) TBPK tablet Take by mouth daily. 12 day dosepack po 48 tablet 0  . traMADol (ULTRAM) 50 MG tablet Take 50 mg by mouth every 6 (six) hours as needed. Reported on 05/23/2015    . valACYclovir (VALTREX) 500 MG tablet Take 1 tablet (500 mg total) by mouth daily as needed (for break outs). 30 tablet 0   No current facility-administered medications on file prior to visit.      Allergies: Morphine sulfate    Review of Systems:  ( Completed via her adult medical history intake form today ) General:  Denies fever, chills, appetite changes, unexplained weight loss.  Respiratory: Denies SOB, DOE, cough, wheezing.  Cardiovascular: Denies chest pain, palpitations.  Gastrointestinal: Denies nausea, vomiting, diarrhea, abdominal pain.  Genitourinary: Denies dysuria, increased frequency, flank pain. Endocrine:  Denies hot or cold intolerance, polyuria, polydipsia. Musculoskeletal: Denies myalgias, back pain, joint swelling, arthralgias, gait problems.  Skin: Denies pallor, rash, suspicious lesions.  Neurological: Denies dizziness, seizures, syncope, unexplained weakness, lightheadedness, numbness and headaches.  Psychiatric/Behavioral: Denies mood changes, suicidal or homicidal ideations, hallucinations, sleep disturbances.   Objective:    Blood pressure 121/69, pulse 88, weight 149 lb 1.6 oz (67.6 kg). Body mass index is 26.41 kg/m. General Appearance:    Alert, cooperative, no distress, appears stated age  Head:    Normocephalic, without obvious abnormality, atraumatic  Eyes:    PERRL, conjunctiva/corneas clear, EOM's intact, limited     nondilated exam of fundi benign, both eyes  Ears:    Normal TM's and external ear canals, both ears  Nose:   Nares normal, septum midline, mucosa normal, no drainage    or sinus tenderness  Throat:   Lips w/o lesion, mucosa moist, and tongue normal; teeth and   gums normal  Neck:   Supple, symmetrical, trachea midline, no adenopathy;    thyroid:  no enlargement/tenderness/nodules; no carotid   bruit or JVD  Back:     Symmetric, no curvature, ROM normal, no CVA tenderness  Lungs:     Clear to auscultation bilaterally, respirations unlabored, no       Wh/ R/ R  Chest Wall:    No tenderness or gross deformity; normal excursion   Heart:    Regular rate and rhythm, S1 and S2 normal, no murmur, rub   or gallop  Breast Exam:    deferred  Abdomen:     Soft, non-tender, bowel sounds active all four quadrants, NO   G/R/R, no masses, no organomegaly  Genitalia:    deferred  Rectal:    deferred  Extremities:   Extremities normal, atraumatic, no cyanosis or gross edema  Pulses:  2+ and symmetric all extremities  Skin:   Warm, dry, Skin color, texture, turgor normal, no obvious   rashes or lesions  Neurologic:   CNII-XII intact, normal strength, sensation and  reflexes    Throughout

## 2015-11-09 NOTE — Patient Instructions (Signed)
Do YOUR WATER aerobics at least 2 times weekly and walk 30 minutes the other days of the week.  We will give patient prescription for diazepam today but she knows I would like to get her off these. She will make a 1 month follow-up with me to discuss her depression and mood.   Also we will check B12 levels and any other blood work needed at that time.

## 2015-11-10 LAB — MICROALBUMIN / CREATININE URINE RATIO
CREATININE, URINE: 68 mg/dL (ref 20–320)
MICROALB UR: 1.9 mg/dL
Microalb Creat Ratio: 28 mcg/mg creat (ref <30)

## 2015-11-20 DIAGNOSIS — M5441 Lumbago with sciatica, right side: Secondary | ICD-10-CM | POA: Diagnosis not present

## 2015-11-20 DIAGNOSIS — M9903 Segmental and somatic dysfunction of lumbar region: Secondary | ICD-10-CM | POA: Diagnosis not present

## 2015-11-20 DIAGNOSIS — M9905 Segmental and somatic dysfunction of pelvic region: Secondary | ICD-10-CM | POA: Diagnosis not present

## 2015-11-20 DIAGNOSIS — M5136 Other intervertebral disc degeneration, lumbar region: Secondary | ICD-10-CM | POA: Diagnosis not present

## 2015-11-20 DIAGNOSIS — M9904 Segmental and somatic dysfunction of sacral region: Secondary | ICD-10-CM | POA: Diagnosis not present

## 2015-11-20 DIAGNOSIS — M25551 Pain in right hip: Secondary | ICD-10-CM | POA: Diagnosis not present

## 2015-11-24 DIAGNOSIS — F329 Major depressive disorder, single episode, unspecified: Secondary | ICD-10-CM | POA: Insufficient documentation

## 2015-12-04 ENCOUNTER — Telehealth: Payer: Self-pay | Admitting: Cardiovascular Disease

## 2015-12-04 NOTE — Telephone Encounter (Signed)
New message    *STAT* If patient is at the pharmacy, call can be transferred to refill team.   1. Which medications need to be refilled? (please list name of each medication and dose if known)  lasartan 100mg   2. Which pharmacy/location (including street and city if local pharmacy) is medication to be sent to?[facility]  3. Do they need a 30 day or 90 day supply? [place]

## 2015-12-05 NOTE — Telephone Encounter (Signed)
Returned patient's call letting her know she has more refills at [facility].

## 2015-12-11 ENCOUNTER — Encounter: Payer: Self-pay | Admitting: Family Medicine

## 2015-12-11 ENCOUNTER — Ambulatory Visit (INDEPENDENT_AMBULATORY_CARE_PROVIDER_SITE_OTHER): Payer: Medicare Other | Admitting: Family Medicine

## 2015-12-11 VITALS — BP 124/78 | HR 90 | Ht 63.0 in | Wt 149.0 lb

## 2015-12-11 DIAGNOSIS — E785 Hyperlipidemia, unspecified: Secondary | ICD-10-CM | POA: Diagnosis not present

## 2015-12-11 DIAGNOSIS — E119 Type 2 diabetes mellitus without complications: Secondary | ICD-10-CM | POA: Diagnosis not present

## 2015-12-11 DIAGNOSIS — F411 Generalized anxiety disorder: Secondary | ICD-10-CM

## 2015-12-11 DIAGNOSIS — E538 Deficiency of other specified B group vitamins: Secondary | ICD-10-CM | POA: Diagnosis not present

## 2015-12-11 DIAGNOSIS — I10 Essential (primary) hypertension: Secondary | ICD-10-CM

## 2015-12-11 DIAGNOSIS — J4530 Mild persistent asthma, uncomplicated: Secondary | ICD-10-CM

## 2015-12-11 MED ORDER — CYANOCOBALAMIN 1000 MCG/ML IJ SOLN
1000.0000 ug | Freq: Once | INTRAMUSCULAR | Status: AC
  Administered 2015-12-11: 1000 ug via INTRAMUSCULAR

## 2015-12-11 MED ORDER — ESCITALOPRAM OXALATE 5 MG PO TABS: 5.0000 mg | ORAL_TABLET | Freq: Every day | ORAL | 1 refills | Status: DC

## 2015-12-11 NOTE — Progress Notes (Signed)
Impression and Recommendations:    1. Anxiety state   2. B12 deficiency   3. Hyperlipidemia   4. Type 2 diabetes mellitus without complication, unspecified long term insulin use status ([place])   5. Essential hypertension   6. Asthma, mild persistent, uncomplicated     Recheck CBC, CMP, TSH, B12, fasting lipid profile, vitamin D next office visit   Diabetes mellitus type 2, uncomplicated ([place]) - Sugar stable, patient without complaints/ symptoms. Fasting blood sugars in the 100 to 130 range    Urine Microalbumin w/creat. ratio       Collection Time: 11/09/15 10:40 AM  Result Value Ref Range   Creatinine, Urine 68 20 - 320 mg/dL   Microalb, Ur 1.9 Not estab mg/dL   Microalb Creat Ratio 28 <30 mcg/mg creat    Comment: The ADA has defined abnormalities in albumin excretion as follows:           Category           Result                            (mcg/mg creatinine)                 Normal:    <30       Microalbuminuria:    30 - 299   Clinical albuminuria:    > or = 300   The ADA recommends that at least two of three specimens collected within a 3 - 6 month period be abnormal before considering a patient to be within a diagnostic category.     POCT glycosylated hemoglobin (Hb A1C)     Status: None   Collection Time: 11/09/15 11:27 AM  Result Value Ref Range   Hemoglobin A1C 6.2      Anxiety state Only use Valium ( #30 dispensed on 8\17\17 ) as needed panic attacks!  Patient understands risks associated with this medicine in someone her age and that I only gave it to her when her husband died because she was extremely emotionally distraught. She will use judiciously.  An attempt to control her anxiety and decreased need for ever taking Valium, we will add Lexapro 5 mg today. Risks benefits of medicines discussed with patient. She will continue her duloxetine for her fibromyalgia and chronic pain syndrome. We will monitor her closely.    Hyperlipidemia Treatment  per cardiology. No statin or any other cholesterol medication.    Patient has not had a fasting lipid profile in some time. Told patient to call Dr. Naida Sleight office for follow-up office visit as she is post a yearly and is due.  If they do not obtain it in the next couple months, we will.    Essential hypertension Stable.  Txmnt per [facility] and their team of pharmacists and docs.     Asthma, mild persistent Patient's breathing is stable. She sees her pulmonologist Dr. Lake Bells every 6 months. Due in November     B12 deficiency Patient is on every monthly injections under direction of Dr Posey Pronto- Neuro.       Patient's Medications  New Prescriptions   ESCITALOPRAM (LEXAPRO) 5 MG TABLET    Take 1 tablet (5 mg total) by mouth daily.  Previous Medications   ACETAMINOPHEN (TYLENOL) 500 MG TABLET    Take 1,000 mg by mouth every 6 (six) hours as needed. For pain   ALBUTEROL (PROAIR HFA) 108 (90 BASE) MCG/ACT INHALER  Inhale 2 puffs into the lungs every 6 (six) hours as needed.   AMLODIPINE (NORVASC) 5 MG TABLET    Take 1 tablet (5 mg total) by mouth daily.   BUDESONIDE-FORMOTEROL (SYMBICORT) 80-4.5 MCG/ACT INHALER    Inhale 2 puffs into the lungs 2 (two) times daily.   CETIRIZINE (ZYRTEC) 10 MG TABLET    Take 10 mg by mouth daily.    CHOLECALCIFEROL (VITAMIN D3) 1000 UNITS CAPS    Take 1 capsule by mouth daily.    DIAZEPAM (VALIUM) 5 MG TABLET    1/2 tab q 12 hrs PRN SEVERE panic anxiety   DOCUSATE SODIUM (STOOL SOFTENER) 100 MG CAPSULE    Take 100 mg by mouth daily.     FLUTICASONE (FLONASE) 50 MCG/ACT NASAL SPRAY    USE 2 SPRAYS INTO EACH NOSTRIL EVERY DAY   LOSARTAN (COZAAR) 100 MG TABLET    Take 1 tablet (100 mg total) by mouth daily.   PREDNISONE (STERAPRED UNI-PAK 48 TAB) 10 MG (48) TBPK TABLET    Take by mouth daily. 12 day dosepack po   TRAMADOL (ULTRAM) 50 MG TABLET    Take 50 mg by mouth every 6 (six) hours as needed. Reported on 05/23/2015   VALACYCLOVIR (VALTREX) 500 MG TABLET     Take 1 tablet (500 mg total) by mouth daily as needed (for break outs).  Modified Medications   Modified Medication Previous Medication   DULOXETINE (CYMBALTA) 60 MG CAPSULE DULoxetine (CYMBALTA) 60 MG capsule      Take 1 capsule (60 mg total) by mouth daily.    Take 1 capsule (60 mg total) by mouth daily.   MELOXICAM (MOBIC) 15 MG TABLET meloxicam (MOBIC) 15 MG tablet      Take 1 tablet (15 mg total) by mouth daily.    Take 1 tablet (15 mg total) by mouth daily.   MONTELUKAST (SINGULAIR) 10 MG TABLET montelukast (SINGULAIR) 10 MG tablet      Take 1 tablet (10 mg total) by mouth at bedtime.    Take 1 tablet (10 mg total) by mouth at bedtime.   OMEPRAZOLE (PRILOSEC) 20 MG CAPSULE omeprazole (PRILOSEC) 20 MG capsule      Take 1 capsule (20 mg total) by mouth daily.    Take 20 mg by mouth daily.  Discontinued Medications   No medications on file    Meds ordered this encounter  Medications  . DISCONTD: omeprazole (PRILOSEC) 20 MG capsule    Sig: Take 20 mg by mouth daily.  Marland Kitchen escitalopram (LEXAPRO) 5 MG tablet    Sig: Take 1 tablet (5 mg total) by mouth daily.    Dispense:  90 tablet    Refill:  1  . cyanocobalamin ((VITAMIN B-12)) injection 1,000 mcg   There are no discontinued medications.   The patient was counseled, risk factors were discussed, anticipatory guidance given.  Gross side effects, risk and benefits, and alternatives of medications and treatment plan in general discussed with patient.  Patient is aware that all medications have potential side effects and we are unable to predict every side effect or drug-drug interaction that may occur.   Patient will call with any questions prior to using medication if they have concerns.  Expresses verbal understanding and consents to current therapy and treatment regimen.  No barriers to understanding were identified.  Red flag symptoms and signs discussed in detail.  Patient expressed understanding regarding what to do in case of  emergency\urgent symptoms  Return in about 4  weeks (around 01/08/2016) for 4 weeks recheck to see how Lexapro doing; also fasting bldwork near future.  Please see AVS handed out to patient at the end of our visit for further patient instructions/ counseling done pertaining to today's office visit.    Note: This document was prepared using Dragon voice recognition software and may include unintentional dictation errors.   --------------------------------------------------------------------------------------------------------------------------------------------------------------------------------------------------------------------------------------------    Subjective:    CC:  Chief Complaint  Patient presents with  . Anxiety    Diazepam working well.     HPI: [name] is a 82 y.o. female who presents to [place] at [facility] today for issues as discussed below.   Here for f/up of her mood d/o and chronic diazepam usage.  Tightness in chest comes with panic attacks/ when thinking about things- otherwise no chest pain, palpitations, shortness of breath, orthopnea, paroxysmal nocturnal dyspnea, swelling in lower extremities, claudication symptoms, headaches, dizziness, visual changes.    Right now 2 of her best friends are very ill.  This is very upsetting to patient and she is tearful in the office. There is only been a couple of months since she lost her husband. She states she feels lonely at times. No suicidal or homicidal ideations. She tries to keep herself busy. She has not joined [employer] at [employer] per my recommendation in the past.     Dr Nelva Bush--- goes to him for injections in her back and he treats her chronic back pain.  Going to see him in 9 days 27th this month.  Patient tells me that she has spent 2200 dollars on chiropractic care in recent yrs and little help.  I strongly suggested that she talk to Dr. Jeanell Sparrow most about physical therapy which  would help.   Also needs her B12 injection. This was started by Dr. Narda Amber, of neurology.  I did encourage patient to make a follow-up with her as it's been several months and she wanted to see patient back. We have been giving patient her B12 injections here, per Dr. Serita Grit orders, for ease and convenience of the patient.    Wt Readings from Last 3 Encounters:  12/11/15 149 lb (67.6 kg)  11/09/15 149 lb 1.6 oz (67.6 kg)  11/07/15 152 lb (68.9 kg)   BP Readings from Last 3 Encounters:  12/11/15 124/78  11/09/15 121/69  11/07/15 137/72   Pulse Readings from Last 3 Encounters:  12/11/15 90  11/09/15 88  11/07/15 94   BMI Readings from Last 3 Encounters:  12/11/15 26.39 kg/m  11/09/15 26.41 kg/m  11/07/15 26.93 kg/m     Patient Active Problem List   Diagnosis Date Noted  . Diabetes mellitus type 2, uncomplicated ([place]) [id number]    Priority: High  . Asthma, mild persistent 12/19/2008    Priority: High  . Hyperlipidemia 08/12/2007    Priority: High  . Essential hypertension 08/12/2007    Priority: High  . B12 deficiency 08/30/2015    Priority: Medium  . Iron deficiency anemia 11/03/2009    Priority: Medium  . Anxiety state 08/12/2007    Priority: Medium  . FIBROMYALGIA 08/12/2007    Priority: Medium  . Fatigue 06/20/2015    Priority: Low  . Memory loss     Priority: Low  . Lumbar radiculopathy, chronic     Priority: Low  . LBBB (left bundle branch block) 11/13/2010    Priority: Low  . G E R D 08/12/2007    Priority: Low  .  Reaction, situational, acute, to stress(husband dying) 09/21/2015  . Dysuria 08/18/2015  . Herpes simplex labialis 04/03/2012  . Shingles 04/12/2011  . MIGRAINE HEADACHE 05/30/2010  . Allergic rhinitis 08/12/2007  . ARTHRITIS 08/12/2007    Past Medical history, Surgical history, Family history, Social history, Allergies and Medications have been entered into the medical record, reviewed and changed as needed.   Allergies:    Allergies  Allergen Reactions  . Morphine Sulfate Other (See Comments)    Makes her hyper    Review of Systems: No fever/ chills, night sweats, no unintended weight loss, No chest pain, or increased shortness of breath. No N/V/D.  Pertinent positives and negatives noted in HPI above    Objective:   Blood pressure 124/78, pulse 90, height 5\' 3"  (1.6 m), weight 149 lb (67.6 kg), SpO2 99 %. Body mass index is 26.39 kg/m. General: Well Developed, well nourished, appropriate for stated age.  Neuro: Alert and oriented x3, extra-ocular muscles intact, sensation grossly intact.  HEENT: Normocephalic, atraumatic, neck supple, no JVD, no carotid bruit   Skin: Warm and dry, no gross rash. Cardiac: RRR, S1 S2,  no murmurs rubs or gallops.  Respiratory: ECTA B/L, Not using accessory muscles, speaking in full sentences-unlabored. Vascular:  No gross lower ext edema, cap RF less 2 sec. Psych: No HI/SI, judgement and insight good, Euthymic mood. Full Affect.  Recent Results (from the past 2160 hour(s))  Urine Microalbumin w/creat. ratio     Status: None   Collection Time: 11/09/15 10:40 AM  Result Value Ref Range   Creatinine, Urine 68 20 - 320 mg/dL   Microalb, Ur 1.9 Not estab mg/dL   Microalb Creat Ratio 28 <30 mcg/mg creat    Comment: The ADA has defined abnormalities in albumin excretion as follows:           Category           Result                            (mcg/mg creatinine)                 Normal:    <30       Microalbuminuria:    30 - 299   Clinical albuminuria:    > or = 300   The ADA recommends that at least two of three specimens collected within a 3 - 6 month period be abnormal before considering a patient to be within a diagnostic category.     POCT glycosylated hemoglobin (Hb A1C)     Status: None   Collection Time: 11/09/15 11:27 AM  Result Value Ref Range   Hemoglobin A1C 6.2

## 2015-12-11 NOTE — Patient Instructions (Addendum)
- Patient will make a follow-up with her neurologist, Dr. Posey Pronto regarding her B12 therapy and any further recommendations she may have.     Vitamin B12 oral What is this medicine? CYANOCOBALAMIN (sye an oh koe BAL a min) is a man made form of vitamin B12. Vitamin B12 is essential in the development of healthy blood cells, nerve cells, and proteins in the body. It also helps with the metabolism of fats and carbohydrates. It is added to a healthy diet to prevent or treat low vitamin B-12 levels. This medicine may be used for other purposes; ask your health care provider or pharmacist if you have questions. What should I tell my health care provider before I take this medicine? They need to know if you have any of these conditions: -anemia -kidney disease -Leber's disease -malabsorption disorder -an unusual or allergic reaction to cyanocobalamin, cobalt, other medicines, foods, dyes, or preservatives -pregnant or trying to get pregnant -breast-feeding How should I use this medicine? Take this medicine by mouth with a glass of water. Follow the directions on the package or prescription label. If you are taking the tablets, do not chew, cut, or crush this medicine. If using an vitamin solution, use a specially marked spoon or dropper to measure each dose. Ask your pharmacist if you do not have one. Household spoons are not accurate. For best results take this vitamin with food. Take your medicine at regular intervals. Do not take your medicine more often than directed. Talk to your pediatrician regarding the use of this medicine in children. While this drug may be prescribed for selected conditions, precautions do apply. Overdosage: If you think you have taken too much of this medicine contact a poison control center or emergency room at once. NOTE: This medicine is only for you. Do not share this medicine with others. What if I miss a dose? If you miss a dose, take it as soon as you can. If it is  almost time for your next dose, take only that dose. Do not take double or extra doses. What may interact with this medicine? -alcohol -aminosalicylic acid -colchicine -medicines that suppress your bone marrow like chemotherapy, chloramphenicol This list may not describe all possible interactions. Give your health care provider a list of all the medicines, herbs, non-prescription drugs, or dietary supplements you use. Also tell them if you smoke, drink alcohol, or use illegal drugs. Some items may interact with your medicine. What should I watch for while using this medicine? Follow a healthy diet. Taking a vitamin supplement does not replace the need for a balanced diet. Some foods that have vitamin B-12 naturally are fish, seafood, egg yolk, milk and fermented cheese. Too much of this vitamin can be unsafe. Talk to your doctor or health care provider about how much is right for you. What side effects may I notice from receiving this medicine? Side effects that you should report to your doctor or health care professional as soon as possible: -allergic reactions like skin rash, itching or hives, swelling of the face, lips, or tongue -breathing problems -chest pain, tightness Side effects that usually do not require medical attention (report to your doctor or health care professional if they continue or are bothersome): -diarrhea This list may not describe all possible side effects. Call your doctor for medical advice about side effects. You may report side effects to FDA at 1-800-FDA-1088. Where should I keep my medicine? Keep out of the reach of children. Store at room temperature between  15 and 30 degrees C (59 and 85 degrees F). Protect from heat and light. Throw away any unused medicine after the expiration date. NOTE: This sheet is a summary. It may not cover all possible information. If you have questions about this medicine, talk to your doctor, pharmacist, or health care provider.     2016, Elsevier/Gold Standard. (2011-08-13 07:58:17)

## 2015-12-14 ENCOUNTER — Other Ambulatory Visit: Payer: Self-pay

## 2015-12-14 MED ORDER — MELOXICAM 15 MG PO TABS: 15.0000 mg | ORAL_TABLET | Freq: Every day | ORAL | 0 refills | Status: DC

## 2015-12-14 MED ORDER — DULOXETINE HCL 60 MG PO CPEP
60.0000 mg | ORAL_CAPSULE | Freq: Every day | ORAL | 0 refills | Status: DC
Start: 2015-12-14 — End: 2016-01-11

## 2015-12-14 MED ORDER — OMEPRAZOLE 20 MG PO CPDR
20.0000 mg | DELAYED_RELEASE_CAPSULE | Freq: Every day | ORAL | 0 refills | Status: DC
Start: 2015-12-14 — End: 2016-01-11

## 2015-12-14 MED ORDER — MONTELUKAST SODIUM 10 MG PO TABS: 10.0000 mg | ORAL_TABLET | Freq: Every day | ORAL | 0 refills | Status: DC

## 2015-12-17 NOTE — Assessment & Plan Note (Addendum)
Only use Valium ( #30 dispensed on 8\17\17 ) as needed panic attacks!  Patient understands risks associated with this medicine in someone her age and that I only gave it to her when her husband died because she was extremely emotionally distraught. She will use judiciously.  An attempt to control her anxiety and decreased need for ever taking Valium, we will add Lexapro 5 mg today. Risks benefits of medicines discussed with patient. She will continue her duloxetine for her fibromyalgia and chronic pain syndrome. We will monitor her closely.

## 2015-12-17 NOTE — Assessment & Plan Note (Addendum)
-   Sugar stable, patient without complaints/ symptoms. Fasting blood sugars in the 100 to 130 range.      Urine Microalbumin w/creat. ratio       Collection Time: 11/09/15 10:40 AM  Result Value Ref Range   Creatinine, Urine 68 20 - 320 mg/dL   Microalb, Ur 1.9 Not estab mg/dL   Microalb Creat Ratio 28 <30 mcg/mg creat    Comment: The ADA has defined abnormalities in albumin excretion as follows:           Category           Result                            (mcg/mg creatinine)                 Normal:    <30       Microalbuminuria:    30 - 299   Clinical albuminuria:    > or = 300   The ADA recommends that at least two of three specimens collected within a 3 - 6 month period be abnormal before considering a patient to be within a diagnostic category.     POCT glycosylated hemoglobin (Hb A1C)     Status: None   Collection Time: 11/09/15 11:27 AM  Result Value Ref Range   Hemoglobin A1C 6.2

## 2015-12-17 NOTE — Assessment & Plan Note (Signed)
Patient is on every monthly injections under direction of Dr Posey Pronto- Neuro.

## 2015-12-17 NOTE — Assessment & Plan Note (Signed)
Treatment per cardiology. No statin or any other cholesterol medication.    Patient has not had a fasting lipid profile in some time. Told patient to call Dr. Naida Sleight office for follow-up office visit as she is post a yearly and is due.  If they do not obtain it in the next couple months, we will.

## 2015-12-17 NOTE — Assessment & Plan Note (Signed)
Patient's breathing is stable. She sees her pulmonologist Dr. Lake Bells every 6 months. Due in November

## 2015-12-17 NOTE — Assessment & Plan Note (Signed)
Stable.  Txmnt per [facility] and their team of pharmacists and docs.

## 2015-12-20 ENCOUNTER — Encounter: Payer: Self-pay | Admitting: Family Medicine

## 2015-12-20 DIAGNOSIS — G894 Chronic pain syndrome: Secondary | ICD-10-CM | POA: Diagnosis not present

## 2015-12-20 DIAGNOSIS — M47816 Spondylosis without myelopathy or radiculopathy, lumbar region: Secondary | ICD-10-CM | POA: Diagnosis not present

## 2015-12-21 ENCOUNTER — Telehealth: Payer: Self-pay | Admitting: Family Medicine

## 2015-12-21 NOTE — Telephone Encounter (Signed)
Patient said new Lexapro prescription is not working much for her after 9 days and wonders if she can take a half of a Valium today with it to help.

## 2015-12-21 NOTE — Telephone Encounter (Signed)
Advised pt that medications like Lexapro typically take around 2 weeks to feel the full effects of it.  Advised pt that if the medication is not causing side effects, to continue the medication and if she does not feel it is helping within the next 1-2 weeks, call us back to discuss possible medication change.  Advised pt that Dr. Raliegh Scarlet prescribed her the diazepam to take PRN and if she feels she needs to take this medication today, it is okay to do so.  Pt stated that she has had some constipation for the last few days.  Advised pt that she may try a capful of Miralax daily and if the problem does not resolve, she is to call us back.  Also advised pt that if she develops abdominal pains, fever, chills, nausea or vomiting she needs to be seen at the ER.  Pt expressed understanding and is agreeable.  Charyl Bigger, CMA

## 2016-01-03 DIAGNOSIS — G894 Chronic pain syndrome: Secondary | ICD-10-CM | POA: Diagnosis not present

## 2016-01-03 DIAGNOSIS — M961 Postlaminectomy syndrome, not elsewhere classified: Secondary | ICD-10-CM | POA: Diagnosis not present

## 2016-01-03 DIAGNOSIS — M47816 Spondylosis without myelopathy or radiculopathy, lumbar region: Secondary | ICD-10-CM | POA: Diagnosis not present

## 2016-01-10 DIAGNOSIS — Z789 Other specified health status: Secondary | ICD-10-CM | POA: Insufficient documentation

## 2016-01-10 HISTORY — DX: Other specified health status: Z78.9

## 2016-01-11 ENCOUNTER — Encounter: Payer: Self-pay | Admitting: Family Medicine

## 2016-01-11 ENCOUNTER — Ambulatory Visit (INDEPENDENT_AMBULATORY_CARE_PROVIDER_SITE_OTHER): Payer: Medicare Other | Admitting: Family Medicine

## 2016-01-11 VITALS — BP 130/80 | HR 88 | Ht 63.0 in | Wt 147.6 lb

## 2016-01-11 DIAGNOSIS — E7849 Other hyperlipidemia: Secondary | ICD-10-CM

## 2016-01-11 DIAGNOSIS — M797 Fibromyalgia: Secondary | ICD-10-CM

## 2016-01-11 DIAGNOSIS — D508 Other iron deficiency anemias: Secondary | ICD-10-CM | POA: Diagnosis not present

## 2016-01-11 DIAGNOSIS — E559 Vitamin D deficiency, unspecified: Secondary | ICD-10-CM

## 2016-01-11 DIAGNOSIS — I10 Essential (primary) hypertension: Secondary | ICD-10-CM | POA: Diagnosis not present

## 2016-01-11 DIAGNOSIS — R5382 Chronic fatigue, unspecified: Secondary | ICD-10-CM

## 2016-01-11 DIAGNOSIS — F4323 Adjustment disorder with mixed anxiety and depressed mood: Secondary | ICD-10-CM | POA: Insufficient documentation

## 2016-01-11 DIAGNOSIS — R7303 Prediabetes: Secondary | ICD-10-CM | POA: Diagnosis not present

## 2016-01-11 DIAGNOSIS — R413 Other amnesia: Secondary | ICD-10-CM

## 2016-01-11 DIAGNOSIS — E538 Deficiency of other specified B group vitamins: Secondary | ICD-10-CM | POA: Diagnosis not present

## 2016-01-11 DIAGNOSIS — Z23 Encounter for immunization: Secondary | ICD-10-CM | POA: Diagnosis not present

## 2016-01-11 DIAGNOSIS — E119 Type 2 diabetes mellitus without complications: Secondary | ICD-10-CM | POA: Diagnosis not present

## 2016-01-11 DIAGNOSIS — E784 Other hyperlipidemia: Secondary | ICD-10-CM

## 2016-01-11 DIAGNOSIS — Z5320 Procedure and treatment not carried out because of patient's decision for unspecified reasons: Secondary | ICD-10-CM

## 2016-01-11 DIAGNOSIS — Z789 Other specified health status: Secondary | ICD-10-CM

## 2016-01-11 HISTORY — DX: Adjustment disorder with mixed anxiety and depressed mood: F43.23

## 2016-01-11 HISTORY — DX: Prediabetes: R73.03

## 2016-01-11 MED ORDER — ESCITALOPRAM OXALATE 10 MG PO TABS: 10.0000 mg | ORAL_TABLET | Freq: Every day | ORAL | 1 refills | Status: DC

## 2016-01-11 MED ORDER — CYANOCOBALAMIN 1000 MCG/ML IJ SOLN
1000.0000 ug | Freq: Once | INTRAMUSCULAR | Status: AC
  Administered 2016-01-11: 1000 ug via INTRAMUSCULAR

## 2016-01-11 MED ORDER — DULOXETINE HCL 30 MG PO CPEP: 30.0000 mg | ORAL_CAPSULE | Freq: Every day | ORAL | 0 refills | Status: DC

## 2016-01-11 NOTE — Patient Instructions (Addendum)
Discussed with patient to try to take as little as needed due to increased risk of serotonin syndrome with tramadol, Cymbalta and Lexapro. Our goal is to decrease Cymbalta and increase Lexapro per patient wishes today as she feels the Lexapro is doing better for her mood.  Gave handout to pt for education: [email or web address]    Gastroesophageal Reflux Disease, Adult Normally, food travels down the esophagus and stays in the stomach to be digested. However, when a person has gastroesophageal reflux disease (GERD), food and stomach acid move back up into the esophagus. When this happens, the esophagus becomes sore and inflamed. Over time, GERD can create small holes (ulcers) in the lining of the esophagus.  CAUSES This condition is caused by a problem with the muscle between the esophagus and the stomach (lower esophageal sphincter, or LES). Normally, the LES muscle closes after food passes through the esophagus to the stomach. When the LES is weakened or abnormal, it does not close properly, and that allows food and stomach acid to go back up into the esophagus. The LES can be weakened by certain dietary substances, medicines, and medical conditions, including:  Tobacco use.  Pregnancy.  Having a hiatal hernia.  Heavy alcohol use.  Certain foods and beverages, such as coffee, chocolate, onions, and peppermint. RISK FACTORS This condition is more likely to develop in:  People who have an increased body weight.  People who have connective tissue disorders.  People who use NSAID medicines. SYMPTOMS Symptoms of this condition include:  Heartburn.  Difficult or painful swallowing.  The feeling of having a lump in the throat.  Abitter taste in the mouth.  Bad breath.  Having a large amount of saliva.  Having an upset or bloated stomach.  Belching.  Chest pain.  Shortness of breath or wheezing.  Ongoing  (chronic) cough or a night-time cough.  Wearing away of tooth enamel.  Weight loss. Different conditions can cause chest pain. Make sure to see your health care provider if you experience chest pain. DIAGNOSIS Your health care provider will take a medical history and perform a physical exam. To determine if you have mild or severe GERD, your health care provider may also monitor how you respond to treatment. You may also have other tests, including:  An endoscopy toexamine your stomach and esophagus with a small camera.  A test thatmeasures the acidity level in your esophagus.  A test thatmeasures how much pressure is on your esophagus.  A barium swallow or modified barium swallow to show the shape, size, and functioning of your esophagus. TREATMENT The goal of treatment is to help relieve your symptoms and to prevent complications. Treatment for this condition may vary depending on how severe your symptoms are. Your health care provider may recommend:  Changes to your diet.  Medicine.  Surgery. HOME CARE INSTRUCTIONS Diet  Follow a diet as recommended by your health care provider. This may involve avoiding foods and drinks such as:  Coffee and tea (with or without caffeine).  Drinks that containalcohol.  Energy drinks and sports drinks.  Carbonated drinks or sodas.  Chocolate and cocoa.  Peppermint and mint flavorings.  Garlic and onions.  Horseradish.  Spicy and acidic foods, including peppers, chili powder, curry powder, vinegar, hot sauces, and barbecue sauce.  Citrus fruit juices and citrus fruits, such as oranges, lemons, and limes.  Tomato-based foods, such as red sauce, chili, salsa, and pizza with red sauce.  Fried and fatty foods, such as donuts, french fries, potato  chips, and high-fat dressings.  High-fat meats, such as hot dogs and fatty cuts of red and white meats, such as rib eye steak, sausage, ham, and bacon.  High-fat dairy items, such as  whole milk, butter, and cream cheese.  Eat small, frequent meals instead of large meals.  Avoid drinking large amounts of liquid with your meals.  Avoid eating meals during the 2-3 hours before bedtime.  Avoid lying down right after you eat.  Do not exercise right after you eat. General Instructions  Pay attention to any changes in your symptoms.  Take over-the-counter and prescription medicines only as told by your health care provider. Do not take aspirin, ibuprofen, or other NSAIDs unless your health care provider told you to do so.  Do not use any tobacco products, including cigarettes, chewing tobacco, and e-cigarettes. If you need help quitting, ask your health care provider.  Wear loose-fitting clothing. Do not wear anything tight around your waist that causes pressure on your abdomen.  Raise (elevate) the head of your bed 6 inches (15cm).  Try to reduce your stress, such as with yoga or meditation. If you need help reducing stress, ask your health care provider.  If you are overweight, reduce your weight to an amount that is healthy for you. Ask your health care provider for guidance about a safe weight loss goal.  Keep all follow-up visits as told by your health care provider. This is important. SEEK MEDICAL CARE IF:  You have new symptoms.  You have unexplained weight loss.  You have difficulty swallowing, or it hurts to swallow.  You have wheezing or a persistent cough.  Your symptoms do not improve with treatment.  You have a hoarse voice. SEEK IMMEDIATE MEDICAL CARE IF:  You have pain in your arms, neck, jaw, teeth, or back.  You feel sweaty, dizzy, or light-headed.  You have chest pain or shortness of breath.  You vomit and your vomit looks like blood or coffee grounds.  You faint.  Your stool is bloody or black.  You cannot swallow, drink, or eat.   This information is not intended to replace advice given to you by your health care provider.  Make sure you discuss any questions you have with your health care provider.   Document Released: 12/19/2004 Document Revised: 11/30/2014 Document Reviewed: 07/06/2014 Elsevier Interactive Patient Education 2016 [place] for Gastroesophageal Reflux Disease, Adult When you have gastroesophageal reflux disease (GERD), the foods you eat and your eating habits are very important. Choosing the right foods can help ease the discomfort of GERD. WHAT GENERAL GUIDELINES DO I NEED TO FOLLOW?  Choose fruits, vegetables, whole grains, low-fat dairy products, and low-fat meat, fish, and poultry.  Limit fats such as oils, salad dressings, butter, nuts, and avocado.  Keep a food diary to identify foods that cause symptoms.  Avoid foods that cause reflux. These may be different for different people.  Eat frequent small meals instead of three large meals each day.  Eat your meals slowly, in a relaxed setting.  Limit fried foods.  Cook foods using methods other than frying.  Avoid drinking alcohol.  Avoid drinking large amounts of liquids with your meals.  Avoid bending over or lying down until 2-3 hours after eating. WHAT FOODS ARE NOT RECOMMENDED? The following are some foods and drinks that may worsen your symptoms: Vegetables Tomatoes. Tomato juice. Tomato and spaghetti sauce. Chili peppers. Onion and garlic. Horseradish. Fruits Oranges, grapefruit,  and lemon (fruit and juice). Meats High-fat meats, fish, and poultry. This includes hot dogs, ribs, ham, sausage, salami, and bacon. Dairy Whole milk and chocolate milk. Sour cream. Cream. Butter. Ice cream. Cream cheese.  Beverages Coffee and tea, with or without caffeine. Carbonated beverages or energy drinks. Condiments Hot sauce. Barbecue sauce.  Sweets/Desserts Chocolate and cocoa. Donuts. Peppermint and spearmint. Fats and Oils High-fat foods, including [place] fries and potato chips. Other Vinegar.  Strong spices, such as black pepper, white pepper, red pepper, cayenne, curry powder, cloves, ginger, and chili powder. The items listed above may not be a complete list of foods and beverages to avoid. Contact your dietitian for more information.   This information is not intended to replace advice given to you by your health care provider. Make sure you discuss any questions you have with your health care provider.   Document Released: 03/11/2005 Document Revised: 04/01/2014 Document Reviewed: 01/13/2013 Elsevier Interactive Patient Education [employer].

## 2016-01-11 NOTE — Progress Notes (Signed)
Impression and Recommendations:    1. Essential hypertension   2. B12 deficiency   3. Memory loss   4. Other iron deficiency anemia   5. Other hyperlipidemia   6. Need for prophylactic vaccination and inoculation against influenza   7. Adjustment disorder with mixed anxiety and depressed mood   8. Medication intolerance- statins- (make her FM much W)   9. Chronic fatigue syndrome with fibromyalgia   10. Medication refused- (cholesterol)   11. Vitamin D deficiency     Adjustment disorder with mixed anxiety and depressed mood - we increased Lexapro from 5- 10 mg today because she thinks it's not working quite well  enough for her depression.   Serotonin syndrome info given and discussed with patient.  B12 deficiency B12 injection given today and flu shot   Hyperlipidemia Will order fasting lipid profile in near future.  Dietary changes such as low saturated & trans fat and low carb/ ketogenic diets discussed with patient.   Encouraged regular exercise and weight loss when appropriate.   Educational handouts provided at patient's desire.  Contact us prior with any Q's/ concerns.  Essential hypertension Check CMP in the near future.     Blood pressure well controlled.  Continue current meds  Lifestyle modification in addition to medications discussed with patient  Memory loss We'll order TSH, vitamin D.  B12 was done in the past.  Encourage routine exercise  Chronic fatigue syndrome with fibromyalgia Encourage regular exercise  Iron deficiency anemia CBC ordered for near future. Patient asymptomatic  Vitamin D deficiency Patient will get daily medication OTC  Once weekly 50 K international units. Take indefinitely.  flu shot Given today  Blood drawn for CBC, CMP, TSH, B12, fasting lipid profile, vitamin D and magnesium. A1c was just drawn at the end of August which was 6.2    Pt was in the office today for 40+ minutes, with over 50% time spent in  face to face counseling of various medical concerns and in coordination of care Orders Placed This Encounter  Procedures  . Flu vaccine HIGH DOSE PF (Fluzone High dose)  . CBC w/Diff  . Comprehensive metabolic panel  . Lipid panel  . TSH  . VITAMIN D 25 Hydroxy (Vit-D Deficiency, Fractures)  . Vitamin B12  . Magnesium    New Prescriptions   CHOLECALCIFEROL (VITAMIN D3) 5000 UNITS TABS    5,000 IU OTC vitamin D3 daily.   VITAMIN D, ERGOCALCIFEROL, (DRISDOL) 50000 UNITS CAPS CAPSULE    Take 1 capsule (50,000 Units total) by mouth every 7 (seven) days.    Modified Medications   Modified Medication Previous Medication   DULOXETINE (CYMBALTA) 30 MG CAPSULE DULoxetine (CYMBALTA) 60 MG capsule      Take 1 capsule (30 mg total) by mouth daily.    Take 1 capsule (60 mg total) by mouth daily.   ESCITALOPRAM (LEXAPRO) 10 MG TABLET escitalopram (LEXAPRO) 5 MG tablet      Take 1 tablet (10 mg total) by mouth daily.    Take 1 tablet (5 mg total) by mouth daily.    Discontinued Medications   OMEPRAZOLE (PRILOSEC) 20 MG CAPSULE    Take 1 capsule (20 mg total) by mouth daily.   PREDNISONE (STERAPRED UNI-PAK 48 TAB) 10 MG (48) TBPK TABLET    Take by mouth daily. 12 day dosepack po    The patient was counseled, risk factors were discussed, anticipatory guidance given.  Gross side effects, risk and benefits,  and alternatives of medications and treatment plan in general discussed with patient.  Patient is aware that all medications have potential side effects and we are unable to predict every side effect or drug-drug interaction that may occur.   Patient will call with any questions prior to using medication if they have concerns.  Expresses verbal understanding and consents to current therapy and treatment regimen.  No barriers to understanding were identified.  Red flag symptoms and signs discussed in detail.  Patient expressed understanding regarding what to do in case of emergency\urgent  symptoms    Return in about 2 weeks (around 01/25/2016) for Follow-up to discuss Fasting blood work. Patient wishes to return to clinic in a couple weeks to discuss the blood work in person. Also see how she is doing with the increase in Lexapro. In the future we plan to decrease Cymbalta from 60-30 once she is done with her 90 day supply she just purchased.    Please see AVS handed out to patient at the end of our visit for further patient instructions/ counseling done pertaining to today's office visit.    Note: This document was prepared using Dragon voice recognition software and may include unintentional dictation errors.   --------------------------------------------------------------------------------------------------------------------------------------------------------------------------------------------------------------------------------------------    Subjective:    CC:  Chief Complaint  Patient presents with  . Anxiety    HPI: [name] is a 82 y.o. female who presents to [facility] today for issues as discussed below.   Anxiety\depression-Last office visit on 9\18-we added Lexapro 5 mg daily to help control her anxiety state.   She is here as a follow-up to see how she's doing on that medication.   She was using Valium as needed for panic attacks but we wanted to minimize that medication usage. She is feeling somewhat improved but thinks she needs "more medicine" to reach the appropriate affect.  No SI\HI.  She has not gone to the [facility] or senior center per my recommendations in the past   Wt Readings from Last 3 Encounters:  01/19/16 145 lb 4.8 oz (65.9 kg)  01/11/16 147 lb 9.6 oz (67 kg)  12/11/15 149 lb (67.6 kg)   BP Readings from Last 3 Encounters:  01/19/16 137/77  01/11/16 130/80  12/11/15 124/78   Pulse Readings from Last 3 Encounters:  01/19/16 80  01/11/16 88  12/11/15 90   BMI Readings from Last 3 Encounters:   01/19/16 25.74 kg/m  01/11/16 26.15 kg/m  12/11/15 26.39 kg/m     Patient Care Team    Relationship Specialty Notifications Start End  [name], DO PCP - General Family Medicine  08/30/15   Ladene Artist, MD Referring Physician Gastroenterology  07/02/10   Myrlene Broker, MD Referring Physician Urology  07/02/10   Clent Jacks, MD Referring Physician Ophthalmology  07/02/10   Nobie Putnam, MD Referring Physician Hematology and Oncology  07/02/10   Latanya Maudlin, MD Referring Physician Orthopedic Surgery  07/02/10   Theodis Sato, MD (Inactive) Referring Physician Orthopedic Surgery  07/02/10   [name], MD Referring Physician Cardiology  07/02/10   Danella Sensing, MD Consulting Physician Dermatology  11/09/15   Juanito Doom, MD Consulting Physician Pulmonary Disease  11/09/15   Suella Broad, MD Consulting Physician Physical Medicine and Rehabilitation  11/09/15    Comment: pain mgt :  injections and pain meds  Rockne Menghini, RPH-CPP Pharmacist Cardiology  11/19/15    Comment:  "  [facility]" - med mgt of her HTN per request of Dr Alvester Chou- Cards  Alda Berthold, [place] Physician Neurology  12/17/15      Patient Active Problem List   Diagnosis Date Noted  . Medication refused- (any cholesterol ones) 01/20/2016    Priority: High  . Prediabetes- 6.2 in 8/17 01/11/2016    Priority: High  . Adjustment disorder with mixed anxiety and depressed mood 01/11/2016    Priority: High  . Asthma, mild persistent 12/19/2008    Priority: High  . Hyperlipidemia 08/12/2007    Priority: High  . Essential hypertension 08/12/2007    Priority: High  . Chronic fatigue syndrome with fibromyalgia 01/20/2016    Priority: Medium  . Depression 11/24/2015    Priority: Medium  . B12 deficiency 08/30/2015    Priority: Medium  . Iron deficiency anemia 11/03/2009    Priority: Medium  . Anxiety state 08/12/2007    Priority: Medium  . Muscle pain, fibromyalgia 08/12/2007    Priority:  Medium  . Environmental and seasonal allergies 01/20/2016    Priority: Low  . Vitamin D deficiency 01/19/2016    Priority: Low  . Fatigue 06/20/2015    Priority: Low  . Memory loss     Priority: Low  . Lumbar radiculopathy, chronic     Priority: Low  . LBBB (left bundle branch block) 11/13/2010    Priority: Low  . G E R D 08/12/2007    Priority: Low  . Type 2 diabetes mellitus without complication, without long-term current use of insulin ([place]) 01/28/2016  . Medication intolerance- statins- (make her FM much W) 01/10/2016  . Reaction, situational, acute, to stress(husband dying) 09/21/2015  . Dysuria 08/18/2015  . Herpes simplex labialis 04/03/2012  . Shingles 04/12/2011  . MIGRAINE HEADACHE 05/30/2010  . Allergic rhinitis 08/12/2007  . ARTHRITIS 08/12/2007    Past Medical history, Surgical history, Family history, Social history, Allergies and Medications have been entered into the medical record, reviewed and changed as needed.   Allergies:  Allergies  Allergen Reactions  . Morphine Sulfate Other (See Comments)    Makes her hyper    Review of Systems  Constitutional: Negative.  Negative for chills, diaphoresis, fever, malaise/fatigue and weight loss.  HENT: Negative.  Negative for congestion, sore throat and tinnitus.   Eyes: Negative.  Negative for blurred vision, double vision and photophobia.  Respiratory: Negative.  Negative for cough and wheezing.   Cardiovascular: Negative.  Negative for chest pain and palpitations.  Gastrointestinal: Negative.  Negative for blood in stool, diarrhea, nausea and vomiting.  Genitourinary: Negative.  Negative for dysuria, frequency and urgency.  Musculoskeletal: Negative.  Negative for joint pain and myalgias.  Skin: Negative.  Negative for itching and rash.  Neurological: Negative.  Negative for dizziness, focal weakness, weakness and headaches.  Endo/Heme/Allergies: Negative.  Negative for environmental allergies and polydipsia.  Does not bruise/bleed easily.  Psychiatric/Behavioral: Negative.  Negative for depression and memory loss. The patient is not nervous/anxious and does not have insomnia.      Objective:   Blood pressure 130/80, pulse 88, height 5\' 3"  (1.6 m), weight 147 lb 9.6 oz (67 kg). Body mass index is 26.15 kg/m. General: Well Developed, well nourished, appropriate for stated age.  Neuro: Alert and oriented x3, extra-ocular muscles intact, sensation grossly intact.  HEENT: Normocephalic, atraumatic, neck supple   Skin: Warm and dry, no gross rash. Cardiac: RRR, S1 S2,  no murmurs rubs or gallops.  Respiratory: ECTA B/L, Not using accessory  muscles, speaking in full sentences-unlabored. Vascular:  No gross lower ext edema, cap RF less 2 sec. Psych: No HI/SI, judgement and insight good, Euthymic mood. Full Affect.

## 2016-01-12 LAB — COMPREHENSIVE METABOLIC PANEL
ALBUMIN: 4.6 g/dL (ref 3.6–5.1)
ALT: 12 U/L (ref 6–29)
AST: 15 U/L (ref 10–35)
Alkaline Phosphatase: 75 U/L (ref 33–130)
BUN: 12 mg/dL (ref 7–25)
CALCIUM: 9.3 mg/dL (ref 8.6–10.4)
CHLORIDE: 103 mmol/L (ref 98–110)
CO2: 25 mmol/L (ref 20–31)
Creat: 0.91 mg/dL — ABNORMAL HIGH (ref 0.60–0.88)
Glucose, Bld: 102 mg/dL — ABNORMAL HIGH (ref 65–99)
POTASSIUM: 4.3 mmol/L (ref 3.5–5.3)
SODIUM: 141 mmol/L (ref 135–146)
TOTAL PROTEIN: 6.5 g/dL (ref 6.1–8.1)
Total Bilirubin: 0.4 mg/dL (ref 0.2–1.2)

## 2016-01-12 LAB — CBC WITH DIFFERENTIAL/PLATELET
BASOS ABS: 0 {cells}/uL (ref 0–200)
Basophils Relative: 0 %
EOS ABS: 96 {cells}/uL (ref 15–500)
EOS PCT: 2 %
HCT: 41.8 % (ref 35.0–45.0)
HEMOGLOBIN: 13.3 g/dL (ref 11.7–15.5)
LYMPHS ABS: 1104 {cells}/uL (ref 850–3900)
Lymphocytes Relative: 23 %
MCH: 28.7 pg (ref 27.0–33.0)
MCHC: 31.8 g/dL — AB (ref 32.0–36.0)
MCV: 90.3 fL (ref 80.0–100.0)
MPV: 10 fL (ref 7.5–12.5)
Monocytes Absolute: 336 cells/uL (ref 200–950)
Monocytes Relative: 7 %
NEUTROS ABS: 3264 {cells}/uL (ref 1500–7800)
NEUTROS PCT: 68 %
Platelets: 208 10*3/uL (ref 140–400)
RBC: 4.63 MIL/uL (ref 3.80–5.10)
RDW: 15.3 % — ABNORMAL HIGH (ref 11.0–15.0)
WBC: 4.8 10*3/uL (ref 3.8–10.8)

## 2016-01-12 LAB — LIPID PANEL
CHOL/HDL RATIO: 5.5 ratio — AB (ref <=5.0)
CHOLESTEROL: 336 mg/dL — AB (ref 125–200)
HDL: 61 mg/dL (ref >=46)
LDL CALC: 241 mg/dL — AB (ref <130)
TRIGLYCERIDES: 169 mg/dL — AB (ref <150)
VLDL: 34 mg/dL — AB (ref <30)

## 2016-01-12 LAB — VITAMIN B12: VITAMIN B 12: 509 pg/mL (ref 200–1100)

## 2016-01-12 LAB — MAGNESIUM: Magnesium: 2.2 mg/dL (ref 1.5–2.5)

## 2016-01-12 LAB — TSH: TSH: 1.79 mIU/L

## 2016-01-12 LAB — VITAMIN D 25 HYDROXY (VIT D DEFICIENCY, FRACTURES): Vit D, 25-Hydroxy: 18 ng/mL — ABNORMAL LOW (ref 30–100)

## 2016-01-19 ENCOUNTER — Encounter: Payer: Self-pay | Admitting: Family Medicine

## 2016-01-19 ENCOUNTER — Ambulatory Visit (INDEPENDENT_AMBULATORY_CARE_PROVIDER_SITE_OTHER): Payer: Medicare Other | Admitting: Family Medicine

## 2016-01-19 VITALS — BP 137/77 | HR 80 | Ht 63.0 in | Wt 145.3 lb

## 2016-01-19 DIAGNOSIS — F329 Major depressive disorder, single episode, unspecified: Secondary | ICD-10-CM

## 2016-01-19 DIAGNOSIS — R7303 Prediabetes: Secondary | ICD-10-CM | POA: Diagnosis not present

## 2016-01-19 DIAGNOSIS — E559 Vitamin D deficiency, unspecified: Secondary | ICD-10-CM

## 2016-01-19 DIAGNOSIS — F32A Depression, unspecified: Secondary | ICD-10-CM

## 2016-01-19 DIAGNOSIS — I10 Essential (primary) hypertension: Secondary | ICD-10-CM

## 2016-01-19 DIAGNOSIS — E538 Deficiency of other specified B group vitamins: Secondary | ICD-10-CM

## 2016-01-19 DIAGNOSIS — M797 Fibromyalgia: Secondary | ICD-10-CM

## 2016-01-19 DIAGNOSIS — Z5320 Procedure and treatment not carried out because of patient's decision for unspecified reasons: Secondary | ICD-10-CM

## 2016-01-19 DIAGNOSIS — R5382 Chronic fatigue, unspecified: Secondary | ICD-10-CM

## 2016-01-19 DIAGNOSIS — E7801 Familial hypercholesterolemia: Secondary | ICD-10-CM

## 2016-01-19 HISTORY — DX: Vitamin D deficiency, unspecified: E55.9

## 2016-01-19 MED ORDER — VITAMIN D (ERGOCALCIFEROL) 1.25 MG (50000 UNIT) PO CAPS: 50000.0000 [IU] | ORAL_CAPSULE | ORAL | 10 refills | Status: DC

## 2016-01-19 MED ORDER — VITAMIN D3 125 MCG (5000 UT) PO TABS: ORAL_TABLET | ORAL | 3 refills | Status: AC

## 2016-01-19 NOTE — Assessment & Plan Note (Signed)
Patient to continue injections of B12 once monthly. We will recheck in another 4-6 months.

## 2016-01-19 NOTE — Progress Notes (Signed)
Assessment and plan:  1. Prediabetes- 6.2 in 8/17   2. Depression, unspecified depression type   3. Vitamin D deficiency   4. B12 deficiency   5. Essential hypertension   6. Familial hypercholesterolemia   7. Medication refused- (cholesterol)   8. Muscle pain, fibromyalgia   9. Chronic fatigue syndrome with fibromyalgia     B12 deficiency Patient to continue injections of B12 once monthly. We will recheck in another 4-6 months.  Diabetes mellitus type 2, uncomplicated - diet controlled Well-controlled A1c 6.2  on  8\17\17.  Asymptomatic.  Diet controlled  Muscle pain, fibromyalgia -  Patient doing well on the increase in Lexapro.   -  In the future we plan to decrease Cymbalta from 60-39m once she is done with her 90 day supply she just purchased  Prediabetes- 6.2 in 8/17 Well controlled. Is the highest it's ever been lately with an A1c at 6.2 on 8\17\17.     Asymptomatic.   Diet-controlled    - Upon going back into the chart, A1c is never been higher than 6.2.    I went back as far as 2010.  Depression Lexapro in addition to the Cymbalta which she takes for fibromyalgia.  Vitamin D deficiency Gave patient prescription for weekly in addition to daily dosage.  Essential hypertension Well-controlled continue medications.  Hyperlipidemia Cholesterol 336 (H)  Triglycerides 169 (H)  HDL 61  Total CHOL/HDL Ratio 5.5 (H)  VLDL 34 (H)  LDL Cholesterol 241 (H)    Declines statins. Importance of diet and exercise stressed to patient.  Medication refused- (cholesterol) Patient understands risk of declining medications    New Prescriptions   CHOLECALCIFEROL (VITAMIN D3) 5000 UNITS TABS    5,000 IU OTC vitamin D3 daily.   VITAMIN D, ERGOCALCIFEROL, (DRISDOL) 50000 UNITS CAPS CAPSULE    Take 1 capsule (50,000 Units total) by mouth every 7 (seven) days.    Modified Medications   No medications on file      Discontinued Medications   PREDNISONE (STERAPRED UNI-PAK 48 TAB) 10 MG (48) TBPK TABLET    Take by mouth daily. 12 day dosepack po     Return in about 4 weeks (around 02/16/2016) for a1C AND SEE HOW SHE IS TOLERATING THE VITAMIN d.  Anticipatory guidance and routine counseling done re: condition, txmnt options and need for follow up. All questions of patient's were answered.   Gross side effects, risk and benefits, and alternatives of medications discussed with patient.  Patient is aware that all medications have potential side effects and we are unable to predict every sideeffect or drug-drug interaction that may occur.  Expresses verbal understanding and consents to current therapy plan and treatment regiment.  Please see AVS handed out to patient at the end of our visit for additional patient instructions/ counseling done pertaining to today's office visit.  Note: This document was prepared using Dragon voice recognition software and may include unintentional dictation errors.   ----------------------------------------------------------------------------------------------------------------------  Subjective:   CC:   [name] a 82y.o. [place] [facility] recent bloodwork that was done.  Patient last seen on 10\19\17:  " Patient wishes to return to clinic in a couple weeks to discuss the blood work in person. Also see how she is doing with the increase in Lexapro. In the future we plan to decrease Cymbalta from 60-30 once she is done  with her 90 day supply she just purchased."    1. Pt and all of her family members had high cholesterol.  Statins makes her [facility] W- so she chooses not to take anything\statins for it.  Pt's MOm lived to 76, and Father lived 80yo and they had it too- so she feels pretty comfortable staying away from it also. Understands risks.   Pt never smoked.  2. Nov 10th--->  pain management per Dr Nelva Bush will "deaden the nerves." 3. Mood:  Stable. Seems to be doing a little better with the increase in Lexapro from 5 mg to 10 mg last office visit. Explained to patient would not reach full effect for 6-8 weeks likely.      Wt Readings from Last 3 Encounters:  01/19/16 145 lb 4.8 oz (65.9 kg)  01/11/16 147 lb 9.6 oz (67 kg)  12/11/15 149 lb (67.6 kg)   BP Readings from Last 3 Encounters:  01/19/16 137/77  01/11/16 130/80  12/11/15 124/78   Pulse Readings from Last 3 Encounters:  01/19/16 80  01/11/16 88  12/11/15 90   BMI Readings from Last 3 Encounters:  01/19/16 25.74 kg/m  01/11/16 26.15 kg/m  12/11/15 26.39 kg/m     Patient Care Team    Relationship Specialty Notifications Start End  [name], DO PCP - General Family Medicine  08/30/15   Ladene Artist, MD Referring Physician Gastroenterology  07/02/10   Myrlene Broker, MD Referring Physician Urology  07/02/10   Clent Jacks, MD Referring Physician Ophthalmology  07/02/10   Nobie Putnam, MD Referring Physician Hematology and Oncology  07/02/10   Latanya Maudlin, MD Referring Physician Orthopedic Surgery  07/02/10   Theodis Sato, MD (Inactive) Referring Physician Orthopedic Surgery  07/02/10   [name], MD Referring Physician Cardiology  07/02/10   Danella Sensing, MD Consulting Physician Dermatology  11/09/15   Juanito Doom, MD Consulting Physician Pulmonary Disease  11/09/15   Suella Broad, MD Consulting Physician Physical Medicine and Rehabilitation  11/09/15    Comment: pain mgt :  injections and pain meds  Rockne Menghini, RPH-CPP Pharmacist Cardiology  11/19/15    Comment:  " [facility]" - med mgt of her HTN per request of Dr Alvester Chou- Cards  Alda Berthold, [place] Physician Neurology  12/17/15     Full medical history updated and reviewed in the office today   Patient Active Problem List   Diagnosis Date Noted  . Medication refused- (cholesterol) 01/20/2016    Priority:  High  . Prediabetes- 6.2 in 8/17 01/11/2016    Priority: High  . Asthma, mild persistent 12/19/2008    Priority: High  . Hyperlipidemia 08/12/2007    Priority: High  . Essential hypertension 08/12/2007    Priority: High  . Chronic fatigue syndrome with fibromyalgia 01/20/2016    Priority: Medium  . Depression 11/24/2015    Priority: Medium  . B12 deficiency 08/30/2015    Priority: Medium  . Iron deficiency anemia 11/03/2009    Priority: Medium  . Anxiety state 08/12/2007    Priority: Medium  . Muscle pain, fibromyalgia 08/12/2007    Priority: Medium  . Environmental and seasonal allergies 01/20/2016    Priority: Low  . Vitamin D deficiency 01/19/2016    Priority: Low  . Fatigue 06/20/2015    Priority: Low  . Memory loss     Priority: Low  . Lumbar radiculopathy, chronic     Priority: Low  . LBBB (left  bundle branch block) 11/13/2010    Priority: Low  . G E R D 08/12/2007    Priority: Low  . Adjustment disorder with mixed anxiety and depressed mood 01/11/2016  . Reaction, situational, acute, to stress(husband dying) 09/21/2015  . Dysuria 08/18/2015  . Herpes simplex labialis 04/03/2012  . Shingles 04/12/2011  . MIGRAINE HEADACHE 05/30/2010  . Allergic rhinitis 08/12/2007  . ARTHRITIS 08/12/2007    Past Medical History:  Diagnosis Date  . ALLERGIC RHINITIS   . ANEMIA, IRON DEFICIENCY   . ANXIETY   . ARTHRITIS    s/p bilateral THR  . ASTHMA, EXTRINSIC   . Bronchitis   . Cataracts, both eyes   . Chronic constipation   . DIABETES MELLITUS, TYPE II   . FIBROMYALGIA    fibromyalgia  . G E R D   . High cholesterol   . HYPERLIPIDEMIA   . HYPERTENSION   . Interstitial cystitis   . MIGRAINE HEADACHE     Past Surgical History:  Procedure Laterality Date  . ABDOMINAL HYSTERECTOMY    . CATARACT EXTRACTION    . COLONOSCOPY    . DOPPLER ECHOCARDIOGRAPHY    . ESOPHAGOGASTRODUODENOSCOPY ENDOSCOPY    . LUMBAR DISC SURGERY    . NM MYOVIEW LTD     stress test    . Ovarian cyst removed    . TOTAL HIP ARTHROPLASTY      Social History  Substance Use Topics  . Smoking status: Former Smoker    Packs/day: 0.30    Years: 10.00    Types: Cigarettes    Quit date: 03/25/1968  . 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 Alcohol use No    Family Hx: Family History  Problem Relation Age of Onset  . Arthritis Mother   . Hypertension Mother   . Heart disease Father   . Arthritis Father   . Hypertension Father   . Depression Sister   . Hyperlipidemia Sister   . Hypertension Other   . Hyperlipidemia Other     Medications: Current Outpatient Prescriptions  Medication Sig Dispense Refill  . acetaminophen (TYLENOL) 500 MG tablet Take 1,000 mg by mouth every 6 (six) hours as needed. For pain    . albuterol (PROAIR HFA) 108 (90 BASE) MCG/ACT inhaler Inhale 2 puffs into the lungs every 6 (six) hours as needed. 3 Inhaler 3  . amLODipine (NORVASC) 5 MG tablet Take 1 tablet (5 mg total) by mouth daily. 90 tablet 3  . budesonide-formoterol (SYMBICORT) 80-4.5 MCG/ACT inhaler Inhale 2 puffs into the lungs 2 (two) times daily. 1 Inhaler 12  . cetirizine (ZYRTEC) 10 MG tablet Take 10 mg by mouth daily.     . Cholecalciferol (VITAMIN D3) 1000 UNITS CAPS Take 1 capsule by mouth daily.     . diazepam (VALIUM) 5 MG tablet 1/2 tab q 12 hrs PRN SEVERE panic anxiety 30 tablet 0  . Docusate Sodium (STOOL SOFTENER) 100 MG capsule Take 100 mg by mouth daily.      . DULoxetine (CYMBALTA) 30 MG capsule Take 1 capsule (30 mg total) by mouth daily. 30 capsule 0  . escitalopram (LEXAPRO) 10 MG tablet Take 1 tablet (10 mg total) by mouth daily. 90 tablet 1  . fluticasone (FLONASE) 50 MCG/ACT nasal spray USE 2 SPRAYS INTO EACH NOSTRIL EVERY DAY 48 g 3  . losartan (COZAAR) 100 MG tablet Take 1 tablet (100 mg total) by mouth daily. [place]  tablet 3  . meloxicam (MOBIC) 15 MG tablet Take 1 tablet (15 mg total) by mouth daily. 90 tablet 0   . montelukast (SINGULAIR) 10 MG tablet Take 1 tablet (10 mg total) by mouth at bedtime. 90 tablet 0  . traMADol (ULTRAM) 50 MG tablet Take 50 mg by mouth every 6 (six) hours as needed. Reported on 05/23/2015    . valACYclovir (VALTREX) 500 MG tablet Take 1 tablet (500 mg total) by mouth daily as needed (for break outs). 30 tablet 0  . Cholecalciferol (VITAMIN D3) 5000 units TABS 5,000 IU OTC vitamin D3 daily. 90 tablet 3  . Vitamin D, Ergocalciferol, (DRISDOL) 50000 units CAPS capsule Take 1 capsule (50,000 Units total) by mouth every 7 (seven) days. 12 capsule 10   No current facility-administered medications for this visit.     Allergies:  Allergies  Allergen Reactions  . Morphine Sulfate Other (See Comments)    Makes her hyper     ROS: Review of Systems  Constitutional: Negative.  Negative for chills, diaphoresis, fever, malaise/fatigue and weight loss.  HENT: Negative.  Negative for congestion, sore throat and tinnitus.   Eyes: Negative.  Negative for blurred vision, double vision and photophobia.  Respiratory: Negative.  Negative for cough and wheezing.   Cardiovascular: Negative.  Negative for chest pain and palpitations.  Gastrointestinal: Negative.  Negative for blood in stool, diarrhea, nausea and vomiting.  Genitourinary: Negative.  Negative for dysuria, frequency and urgency.  Musculoskeletal: Positive for back pain. Negative for joint pain and myalgias.       Chronic issue, sees Dr. Nelva Bush  Skin: Negative.  Negative for itching and rash.  Neurological: Negative.  Negative for dizziness, focal weakness, weakness and headaches.  Endo/Heme/Allergies: Negative.  Negative for environmental allergies and polydipsia. Does not bruise/bleed easily.  Psychiatric/Behavioral: Negative.  Negative for depression and memory loss. The patient is not nervous/anxious and does not have insomnia.     Objective:  Blood pressure 137/77, pulse 80, height _0  (1.6 m), weight 145 lb 4.8 oz  (65.9 kg). Body mass index is 25.74 kg/m. Gen:   Well NAD, A and O *3 HEENT:    [place]/AT, EOMI,  MMM, OP- clr Lungs:   Normal work of breathing. CTA B/L, no Wh, rhonchi Heart:   RRR, S1, S2 WNL's, no MRG Abd:   No gross distention Exts:    warm, pink,  Brisk capillary refill, warm and well perfused.  Psych:    No HI/SI, judgement and insight good, Euthymic mood. Full Affect.   Recent Results (from the past 2160 hour(s))  Urine Microalbumin w/creat. ratio     Status: None   Collection Time: 11/09/15 10:40 AM  Result Value Ref Range   Creatinine, Urine 68 20 - 320 mg/dL   Microalb, Ur 1.9 Not estab mg/dL   Microalb Creat Ratio 28 <30 mcg/mg creat    Comment: The ADA has defined abnormalities in albumin excretion as follows:           Category           Result                            (mcg/mg creatinine)                 Normal:    <30       Microalbuminuria:    30 - 299   Clinical albuminuria:    > or =  300   The ADA recommends that at least two of three specimens collected within a 3 - 6 month period be abnormal before considering a patient to be within a diagnostic category.     POCT glycosylated hemoglobin (Hb A1C)     Status: None   Collection Time: 11/09/15 11:27 AM  Result Value Ref Range   Hemoglobin A1C 6.2   CBC w/Diff     Status: Abnormal   Collection Time: 01/11/16 10:38 AM  Result Value Ref Range   WBC 4.8 3.8 - 10.8 K/uL   RBC 4.63 3.80 - 5.10 MIL/uL   Hemoglobin 13.3 11.7 - 15.5 g/dL   HCT 41.8 35.0 - 45.0 %   MCV 90.3 80.0 - 100.0 fL   MCH 28.7 27.0 - 33.0 pg   MCHC 31.8 (L) 32.0 - 36.0 g/dL   RDW 15.3 (H) 11.0 - 15.0 %   Platelets 208 140 - 400 K/uL   MPV 10.0 7.5 - 12.5 fL   Neutro Abs 3,264 1,500 - 7,800 cells/uL   Lymphs Abs 1,104 850 - 3,900 cells/uL   Monocytes Absolute 336 200 - 950 cells/uL   Eosinophils Absolute 96 15 - 500 cells/uL   Basophils Absolute 0 0 - 200 cells/uL   Neutrophils Relative % 68 %   Lymphocytes Relative 23 %   Monocytes  Relative 7 %   Eosinophils Relative 2 %   Basophils Relative 0 %   Smear Review Criteria for review not met   Comprehensive metabolic panel     Status: Abnormal   Collection Time: 01/11/16 10:38 AM  Result Value Ref Range   Sodium 141 135 - 146 mmol/L   Potassium 4.3 3.5 - 5.3 mmol/L   Chloride 103 98 - 110 mmol/L   CO2 25 20 - 31 mmol/L   Glucose, Bld 102 (H) 65 - 99 mg/dL   BUN 12 7 - 25 mg/dL   Creat 0.91 (H) 0.60 - 0.88 mg/dL    Comment:   For patients > or = 80 years of age: The upper reference limit for Creatinine is approximately 13% higher for people identified as African-American.      Total Bilirubin 0.4 0.2 - 1.2 mg/dL   Alkaline Phosphatase 75 33 - 130 U/L   AST 15 10 - 35 U/L   ALT 12 6 - 29 U/L   Total Protein 6.5 6.1 - 8.1 g/dL   Albumin 4.6 3.6 - 5.1 g/dL   Calcium 9.3 8.6 - 10.4 mg/dL  Lipid panel     Status: Abnormal   Collection Time: 01/11/16 10:38 AM  Result Value Ref Range   Cholesterol 336 (H) 125 - 200 mg/dL   Triglycerides 169 (H) <150 mg/dL   HDL 61 >=46 mg/dL   Total CHOL/HDL Ratio 5.5 (H) <=5.0 Ratio   VLDL 34 (H) <30 mg/dL   LDL Cholesterol 241 (H) <130 mg/dL    Comment:   Total Cholesterol/HDL Ratio:CHD Risk                        Coronary Heart Disease Risk Table                                        Men       Women          1/2 Average Risk  3.4        3.3              Average Risk              5.0        4.4           2X Average Risk              9.6        7.1           3X Average Risk             23.4       11.0 Use the calculated Patient Ratio above and the CHD Risk table  to determine the patient's CHD Risk.   TSH     Status: None   Collection Time: 01/11/16 10:38 AM  Result Value Ref Range   TSH 1.79 mIU/L    Comment:   Reference Range   > or = 20 Years  0.40-4.50   Pregnancy Range First trimester  0.26-2.66 Second trimester 0.55-2.73 Third trimester  0.43-2.91     VITAMIN D 25 Hydroxy (Vit-D Deficiency,  Fractures)     Status: Abnormal   Collection Time: 01/11/16 10:38 AM  Result Value Ref Range   Vit D, 25-Hydroxy 18 (L) 30 - 100 ng/mL    Comment: Vitamin D Status           25-OH Vitamin D        Deficiency                <20 ng/mL        Insufficiency         20 - 29 ng/mL        Optimal             > or = 30 ng/mL   For 25-OH Vitamin D testing on patients on D2-supplementation and patients for whom quantitation of D2 and D3 fractions is required, the [facility] 25-OH VIT D, (D2,D3), LC/MS/MS is recommended: order code [id number] (patients > 2 yrs).   Vitamin B12     Status: None   Collection Time: 01/11/16 10:38 AM  Result Value Ref Range   Vitamin B-12 509 200 - 1,100 pg/mL  Magnesium     Status: None   Collection Time: 01/11/16 10:38 AM  Result Value Ref Range   Magnesium 2.2 1.5 - 2.5 mg/dL

## 2016-01-19 NOTE — Patient Instructions (Signed)
For those diagnosed with high triglycerides, it's important to take action to lower your levels and improve your heart health.  Triglyceride is just a fancy word for fat - the fat in our bodies is stored in the form of triglycerides. Triglycerides are found in foods and manufactured in our bodies.  Normal triglyceride levels are defined as less than 150 mg/dL; 150 to 199 is considered borderline high; 200 to 499 is high; and 500 or higher is officially called very high. To me, anything over 150 is a red flag indicating my patient needs to take immediate steps to get the situation under control.   What is the significance of high triglycerides? High triglyceride levels make blood thicker and stickier, which means that it is more likely to form clots. Studies have shown that triglyceride levels are associated with increased risks of cardiovascular disease and stroke - in both men and women - alone or in combination with other risk factors (high triglycerides combined with high LDL cholesterol can be a particularly deadly combination). For example, in one ground-breaking study, high triglycerides alone increased the risk of cardiovascular disease by 14 percent in men, and by 68 percent in women. But when the test subjects also had low HDL cholesterol (that's the good cholesterol) and other risk factors, high triglycerides increased the risk of disease by 32 percent in men and 76 percent in women.   Fortunately, triglycerides can sometimes be controlled with several diet and lifestyle changes.    What Factors Can Increase Triglycerides? As with cholesterol, eating too much of the wrong kinds of fats will raise your blood triglycerides.  Therefore, it's important to restrict the amounts of saturated fats and trans fats you allow into your diet.  Triglyceride levels can also shoot up after eating foods that are high in carbohydrates or after drinking alcohol.  That's why triglyceride blood tests require an  overnight fast.  If you have elevated triglycerides, it's especially important to avoid sugary and refined carbohydrates, including sugar, honey, and other sweeteners, soda and other sugary drinks, candy, baked goods, and anything made with white (refined or enriched) flour, including white bread, rolls, cereals, buns, pastries, regular pasta, and white rice.  You'll also want to limit dried fruit and fruit juice since they're dense in simple sugar.  All of these low-quality carbs cause a sudden rise in insulin, which may lead to a spike in triglycerides.  Triglycerides can also become elevated as a reaction to having diabetes, hypothyroidism, or kidney disease. As with most other heart-related factors, being overweight and inactive also contribute to abnormal triglycerides. And unfortunately, some people have a genetic predisposition that causes them to manufacture way too much triglycerides on their own, no matter how carefully they eat.   How Can You Lower Your Triglyceride Levels? If you are diagnosed with high triglycerides, it's important to take action. There are several things you can do to help lower your triglyceride levels and improve your heart health:  Lose weight if you are overweight.  There is a clear correlation between obesity and high triglycerides - the heavier people are, the higher their triglyceride levels are likely to be. The good news is that losing weight can significantly lower triglycerides. In a large study of individuals with type 2 diabetes, those assigned to the "lifestyle intervention group" - which involved counseling, a low-calorie meal plan, and customized exercise program - lost 8.6% of their body weight and lowered their triglyceride levels by more than 16%. If you're  overweight, find a weight loss plan that works for you and commit to shedding the pounds and getting healthier.  Reduce the amount of saturated fat and trans fat in your diet.  Start by avoiding or  dramatically limiting butter, cream cheese, lard, sour cream, doughnuts, cakes, cookies, candy bars, regular ice cream, fried foods, pizza, cheese sauce, cream-based sauces and salad dressings, high-fat meats (including fatty hamburgers, bologna, pepperoni, sausage, bacon, salami, pastrami, spareribs, and hot dogs), high-fat cuts of beef and pork, and whole-milk dairy products.   Other ways to cut back: Choose lean meats only (including skinless chicken and [place], lean beef, lean pork), fish, and reduced-fat or fat-free dairy products.   Experiment with adding whole soy foods to your diet. Although soy itself may not reduce risk of heart disease, it replaces hazardous animal fats with healthier proteins. Choose high-quality soy foods, such as tofu, tempeh, soy milk, and edamame (whole soybeans).  Always remove skin from poultry.  Prepare foods by baking, roasting, broiling, boiling, poaching, steaming, grilling, or stir-frying in vegetable oil.  Most stick margarines contain trans fats, and trans fats are also found in some packaged baked goods, potato chips, snack foods, fried foods, and fast food that use or create hydrogenated oils.    (All food labels must now list the amount of trans fats, right after the amount of saturated fats - good news for consumers. As a result, many food companies have now reformulated their products to be trans fat free.many, but not all! So it's still just as important to read labels and make sure the packaged foods you buy don't contain trans fats.)     If you use margarine, purchase soft-tub margarine spreads that contain 0 grams trans fats and don't list any partially hydrogenated oils in the ingredients list. By substituting olive oil or vegetable oil for trans fats in just 2 percent of your daily calories, you can reduce your risk of heart disease by 53 percent.   There is no safe amount of trans fats, so try to keep them as far from your plate as possible.  Avoid  foods that are concentrated in sugar (even dried fruit and fruit juice). Sugary foods can elevate triglyceride levels in the blood, so keep them to a bare minimum.  Swap out refined carbohydrates for whole grains.  Refined carbohydrates - like white rice, regular pasta, and anything made with white or "enriched" flour (including white bread, rolls, cereals, buns, and crackers) - raise blood sugar and insulin levels more than fiber-rich whole grains. Higher insulin levels, in turn, can lead to a higher rise in triglycerides after a meal. So, make the switch to whole wheat bread, whole grain pasta, brown or wild rice, and whole grain versions of cereals, crackers, and other bread products. However, it's important to know that individuals with high triglycerides should moderate even their intake of high-quality starches (since all starches raise blood sugar) - I recommend 1 to 2 servings per meal.  Cut way back on alcohol.  If you have high triglycerides, alcohol should be considered a rare treat - if you indulge at all, since even small amounts of alcohol can dramatically increase triglyceride levels.  Incorporate omega-3 fats.  Heart-healthy fish oils are especially rich in omega-3 fatty acids. In multiple studies over the past two decades, people who ate diets high in omega-3s had 30 to 40 percent reductions in heart disease. Although we don't yet know why fish oil works so well, there are several  possibilities. Omega-3s seem to reduce inflammation, reduce high blood pressure, decrease triglycerides, raise HDL cholesterol, and make blood thinner and less sticky so it is less likely to clot. It's as close to a food prescription for heart health as it gets. If you have high triglycerides, I recommend eating at least three servings of one of the omega-3-rich fish every week (fatty fish is the most concentrated food form of omega three fats). If you cannot manage to eat that much fish, speak with your physician  about taking fish oil capsules, which offer similar benefits.The best foods for omega-3 fatty acids include wild salmon (fresh, canned), herring, mackerel (not king), sardines, anchovies, rainbow trout, and Pacific oysters. Non-fish sources of omega-3 fats include omega-3-fortified eggs, ground flaxseed, chia seeds, walnuts, butternuts (white walnuts), seaweed, walnut oil, canola oil, and soybeans.  Quit smoking.  Smoking causes inflammation, not just in your lungs, but throughout your body. Inflammation can contribute to atherosclerosis, blood clots, and risk of heart attack. Smoking makes all heart health indicators worse. If you have high cholesterol, high triglycerides, or high blood pressure, smoking magnifies the danger.  Become more physically active.  Even moderate exercise can help improve cholesterol, triglycerides, and blood pressure. Aerobic exercise seems to be able to stop the sharp rise of triglycerides after eating, perhaps because of a decrease in the amount of triglyceride released by the liver, or because active muscle clears triglycerides out of the blood stream more quickly than inactive muscle. If you haven't exercised regularly (or at all) for years, I recommend starting slowly, by walking at an easy pace for 15 minutes a day. Then, as you feel more comfortable, increase the amount. Your ultimate goal should be at least 30 minutes of moderate physical activity, at least five days a week.    Guidelines for a Low Cholesterol, Low Saturated Fat Diet   Fats - Limit total intake of fats and oils. - Avoid butter, stick margarine, shortening, lard, palm and coconut oils. - Limit mayonnaise, salad dressings, gravies and sauces, unless they are homemade with low-fat ingredients. - Limit chocolate. - Choose low-fat and nonfat products, such as low-fat mayonnaise, low-fat or non-hydrogenated peanut butter, low-fat or fat-free salad dressings and nonfat gravy. - Use vegetable oil, such  as canola or olive oil. - Look for margarine that does not contain trans fatty acids. - Use nuts in moderate amounts. - Read ingredient labels carefully to determine both amount and type of fat present in foods. Limit saturated and trans fats! - Avoid high-fat processed and convenience foods.  Meats and Meat Alternatives - Choose fish, chicken, [place] and lean meats. - Use dried beans, peas, lentils and tofu. - Limit egg yolks to three to four per week. - If you eat red meat, limit to no more than three servings per week and choose loin or round cuts. - Avoid fatty meats, such as bacon, sausage, franks, luncheon meats and ribs. - Avoid all organ meats, including liver.  Dairy - Choose nonfat or low-fat milk, yogurt and cottage cheese. - Most cheeses are high in fat. Choose cheeses made from non-fat milk, such as mozzarella and ricotta cheese. - Choose light or fat-free cream cheese and sour cream. - Avoid cream and sauces made with cream.  Fruits and Vegetables - Eat a wide variety of fruits and vegetables. - Use lemon juice, vinegar or "mist" olive oil on vegetables. - Avoid adding sauces, fat or oil to vegetables.  Breads, Cereals and Grains - Choose whole-grain  breads, cereals, pastas and rice. - Avoid high-fat snack foods, such as granola, cookies, pies, pastries, doughnuts and croissants.  Cooking Tips - Avoid deep fried foods. - Trim visible fat off meats and remove skin from poultry before cooking. - Bake, broil, boil, poach or roast poultry, fish and lean meats. - Drain and discard fat that drains out of meat as you cook it. - Add little or no fat to foods. - Use vegetable oil sprays to grease pans for cooking or baking. - Steam vegetables. - Use herbs or no-oil marinades to flavor foods.

## 2016-01-20 DIAGNOSIS — G9332 Myalgic encephalomyelitis/chronic fatigue syndrome: Secondary | ICD-10-CM | POA: Insufficient documentation

## 2016-01-20 DIAGNOSIS — R5382 Chronic fatigue, unspecified: Secondary | ICD-10-CM

## 2016-01-20 DIAGNOSIS — M797 Fibromyalgia: Secondary | ICD-10-CM

## 2016-01-20 DIAGNOSIS — Z5320 Procedure and treatment not carried out because of patient's decision for unspecified reasons: Secondary | ICD-10-CM | POA: Insufficient documentation

## 2016-01-20 DIAGNOSIS — J3089 Other allergic rhinitis: Secondary | ICD-10-CM | POA: Insufficient documentation

## 2016-01-20 HISTORY — DX: Procedure and treatment not carried out because of patient's decision for unspecified reasons: Z53.20

## 2016-01-20 HISTORY — DX: Fibromyalgia: M79.7

## 2016-01-20 HISTORY — DX: Myalgic encephalomyelitis/chronic fatigue syndrome: G93.32

## 2016-01-20 NOTE — Assessment & Plan Note (Signed)
-    Patient doing well on the increase in Lexapro.   -  In the future we plan to decrease Cymbalta from 60-30mg  once she is done with her 90 day supply she just purchased

## 2016-01-20 NOTE — Assessment & Plan Note (Signed)
Patient understands risk of declining medications

## 2016-01-20 NOTE — Assessment & Plan Note (Signed)
Lexapro in addition to the Cymbalta which she takes for fibromyalgia.

## 2016-01-20 NOTE — Assessment & Plan Note (Signed)
Gave patient prescription for weekly in addition to daily dosage.

## 2016-01-20 NOTE — Assessment & Plan Note (Signed)
Well-controlled A1c 6.2  on  8\17\17.  Asymptomatic.  Diet controlled

## 2016-01-20 NOTE — Assessment & Plan Note (Signed)
Well-controlled continue medications.

## 2016-01-20 NOTE — Assessment & Plan Note (Addendum)
Cholesterol 336 (H)  Triglycerides 169 (H)  HDL 61  Total CHOL/HDL Ratio 5.5 (H)  VLDL 34 (H)  LDL Cholesterol 241 (H)    Declines statins. Importance of diet and exercise stressed to patient.

## 2016-01-20 NOTE — Assessment & Plan Note (Addendum)
Well controlled. Is the highest it's ever been lately with an A1c at 6.2 on 8\17\17.     Asymptomatic.   Diet-controlled    - Upon going back into the chart, A1c is never been higher than 6.2.    I went back as far as 2010.

## 2016-01-28 NOTE — Assessment & Plan Note (Addendum)
Check CMP in the near future.     Blood pressure well controlled.  Continue current meds  Lifestyle modification in addition to medications discussed with patient

## 2016-01-28 NOTE — Assessment & Plan Note (Addendum)
Will order fasting lipid profile in near future.  Dietary changes such as low saturated & trans fat and low carb/ ketogenic diets discussed with patient.   Encouraged regular exercise and weight loss when appropriate.   Educational handouts provided at patient's desire.  Contact us prior with any Q's/ concerns.

## 2016-01-28 NOTE — Assessment & Plan Note (Signed)
B12 injection given today and flu shot

## 2016-01-28 NOTE — Assessment & Plan Note (Signed)
Patient will get daily medication OTC  Once weekly 50 K international units. Take indefinitely.

## 2016-01-28 NOTE — Assessment & Plan Note (Signed)
Encourage regular exercise.

## 2016-01-28 NOTE — Assessment & Plan Note (Signed)
We'll order TSH, vitamin D.  B12 was done in the past.  Encourage routine exercise

## 2016-01-28 NOTE — Assessment & Plan Note (Signed)
-   we increased Lexapro from 5- 10 mg today because she thinks it's not working quite well  enough for her depression.   Serotonin syndrome info given and discussed with patient.

## 2016-01-28 NOTE — Assessment & Plan Note (Addendum)
CBC ordered for near future. Patient asymptomatic

## 2016-01-31 DIAGNOSIS — R109 Unspecified abdominal pain: Secondary | ICD-10-CM | POA: Diagnosis not present

## 2016-01-31 DIAGNOSIS — N3020 Other chronic cystitis without hematuria: Secondary | ICD-10-CM | POA: Diagnosis not present

## 2016-02-02 DIAGNOSIS — M47816 Spondylosis without myelopathy or radiculopathy, lumbar region: Secondary | ICD-10-CM | POA: Diagnosis not present

## 2016-02-02 DIAGNOSIS — M5136 Other intervertebral disc degeneration, lumbar region: Secondary | ICD-10-CM | POA: Diagnosis not present

## 2016-02-08 ENCOUNTER — Other Ambulatory Visit: Payer: Self-pay

## 2016-02-08 MED ORDER — DULOXETINE HCL 30 MG PO CPEP: 30.0000 mg | ORAL_CAPSULE | Freq: Every day | ORAL | 0 refills | Status: DC

## 2016-02-09 ENCOUNTER — Ambulatory Visit (INDEPENDENT_AMBULATORY_CARE_PROVIDER_SITE_OTHER): Payer: Medicare Other

## 2016-02-09 VITALS — BP 117/68 | HR 90 | Wt 144.6 lb

## 2016-02-09 DIAGNOSIS — E538 Deficiency of other specified B group vitamins: Secondary | ICD-10-CM

## 2016-02-09 MED ORDER — CYANOCOBALAMIN 1000 MCG/ML IJ SOLN
1000.0000 ug | Freq: Once | INTRAMUSCULAR | Status: AC
  Administered 2016-02-09: 1000 ug via INTRAMUSCULAR

## 2016-02-09 NOTE — Progress Notes (Signed)
Pt is here for a vitamin B12 injection.  Pt states that she has had some diarrhea recently but just completed a course of Cipro for UTI.  Pt denies any dizziness.  Patient tolerated injection well without complications.  Patient advised to schedule next injection in 30 days.  Charyl Bigger, CMA

## 2016-02-20 ENCOUNTER — Encounter: Payer: Self-pay | Admitting: Pulmonary Disease

## 2016-02-20 ENCOUNTER — Ambulatory Visit (INDEPENDENT_AMBULATORY_CARE_PROVIDER_SITE_OTHER): Payer: Medicare Other | Admitting: Pulmonary Disease

## 2016-02-20 DIAGNOSIS — M961 Postlaminectomy syndrome, not elsewhere classified: Secondary | ICD-10-CM | POA: Diagnosis not present

## 2016-02-20 DIAGNOSIS — G894 Chronic pain syndrome: Secondary | ICD-10-CM | POA: Diagnosis not present

## 2016-02-20 DIAGNOSIS — J4531 Mild persistent asthma with (acute) exacerbation: Secondary | ICD-10-CM

## 2016-02-20 DIAGNOSIS — M5136 Other intervertebral disc degeneration, lumbar region: Secondary | ICD-10-CM | POA: Diagnosis not present

## 2016-02-20 NOTE — Progress Notes (Signed)
Subjective:    Patient ID: [name], female    DOB: 1933-10-05, 82 y.o.   MRN: [id number]   Synopsis: Former patient of Dr. Joya Gaskins who has moderate persistent asthma. March 2017 eosinophil count 100 cells per microliter, IgE 26 Jul 2015 pulmonary function testing ratio 86%, FEV1 2.10 L (113% predicted), FVC 2.45 (98% predicted), no change in FEV1 with bronchodilator, total lung capacity 4.57 L (93% predicted). DLCO 17.47 (76% predicted).   HPI Chief Complaint  Patient presents with  . Follow-up    pt states she has been overall doing well.    [name] says that she is not doing well.  Her husband died in October 07, 2022.  Since then she has lost weight, started on BP medications.  They had been married for 41 years.  She is taking something for depression.  She has family here who are helping take care of her.    She says that her lungs have been OK despite all this.  She says that she had one bout of bronchitis and she was treated by her PCP for this with prednisone.  This happened sometime after her husband died in 10/07/2022.  Currently no trouble with breathing.  Taking symbicort regularly and not using albuterol.  In general she feels optimistic that she will start feeling better.    Past Medical History:  Diagnosis Date  . ALLERGIC RHINITIS   . ANEMIA, IRON DEFICIENCY   . ANXIETY   . ARTHRITIS    s/p bilateral THR  . ASTHMA, EXTRINSIC   . Bronchitis   . Cataracts, both eyes   . Chronic constipation   . DIABETES MELLITUS, TYPE II   . FIBROMYALGIA    fibromyalgia  . G E R D   . High cholesterol   . HYPERLIPIDEMIA   . HYPERTENSION   . Interstitial cystitis   . MIGRAINE HEADACHE       Review of Systems  Constitutional: Positive for diaphoresis and fatigue. Negative for chills and unexpected weight change.  HENT: Negative for rhinorrhea, sinus pressure and sneezing.   Respiratory: Positive for shortness of breath. Negative for choking and wheezing.   Cardiovascular: Negative  for chest pain, palpitations and leg swelling.       Objective:   Physical Exam Vitals:   02/20/16 0959  BP: 132/70  Pulse: 83  SpO2: 98%  Weight: 144 lb (65.3 kg)  Height: 5\' 3"  (1.6 m)     Gen: well appearing HENT: OP clear, TM's clear, neck supple PULM: CTA B, normal percussion CV: RRR, no mgr, trace edema GI: BS+, soft, nontender Derm: no cyanosis or rash Psyche: normal mood and affect  March 2017 eosinophil count 100 cells per microliter, IgE 26 Jul 2015 pulmonary function testing ratio 86%, FEV1 2.10 L (113% predicted), FVC 2.45 (98% predicted), no change in FEV1 with bronchodilator, total lung capacity 4.57 L (93% predicted). DLCO 17.47 (76% predicted).      Assessment & Plan:  Asthma, mild persistent This has been a rough interval for [name] from an emotional standpoint, but fortunately her asthma hasn't given her too much trouble.  However she had an exacerbation at some point in the Summer, so I am not interested in decreasing the Symbicort right now.  Plan: Continue Symbicort 80/4.52 puffs twice a day Flu shot is up-to-date Continue albuterol as needed Follow-up 6 month    Current Outpatient Prescriptions:  .  acetaminophen (TYLENOL) 500 MG tablet, Take 1,000 mg by mouth every 6 (six) hours  as needed. For pain, Disp: , Rfl:  .  albuterol (PROAIR HFA) 108 (90 BASE) MCG/ACT inhaler, Inhale 2 puffs into the lungs every 6 (six) hours as needed., Disp: 3 Inhaler, Rfl: 3 .  budesonide-formoterol (SYMBICORT) 80-4.5 MCG/ACT inhaler, Inhale 2 puffs into the lungs 2 (two) times daily., Disp: 1 Inhaler, Rfl: 12 .  cetirizine (ZYRTEC) 10 MG tablet, Take 10 mg by mouth daily. , Disp: , Rfl:  .  Cholecalciferol (VITAMIN D3) 5000 units TABS, 5,000 IU OTC vitamin D3 daily., Disp: 90 tablet, Rfl: 3 .  diazepam (VALIUM) 5 MG tablet, 1/2 tab q 12 hrs PRN SEVERE panic anxiety, Disp: 30 tablet, Rfl: 0 .  Docusate Sodium (STOOL SOFTENER) 100 MG capsule, Take 100 mg by mouth daily.   , Disp: , Rfl:  .  DULoxetine (CYMBALTA) 30 MG capsule, Take 1 capsule (30 mg total) by mouth daily., Disp: 90 capsule, Rfl: 0 .  escitalopram (LEXAPRO) 10 MG tablet, Take 1 tablet (10 mg total) by mouth daily., Disp: 90 tablet, Rfl: 1 .  fluticasone (FLONASE) 50 MCG/ACT nasal spray, USE 2 SPRAYS INTO EACH NOSTRIL EVERY DAY, Disp: 48 g, Rfl: 3 .  losartan (COZAAR) 100 MG tablet, Take 1 tablet (100 mg total) by mouth daily., Disp: 90 tablet, Rfl: 3 .  meloxicam (MOBIC) 15 MG tablet, Take 1 tablet (15 mg total) by mouth daily., Disp: 90 tablet, Rfl: 0 .  montelukast (SINGULAIR) 10 MG tablet, Take 1 tablet (10 mg total) by mouth at bedtime., Disp: 90 tablet, Rfl: 0 .  traMADol (ULTRAM) 50 MG tablet, Take 50 mg by mouth every 6 (six) hours as needed. Reported on 05/23/2015, Disp: , Rfl:  .  valACYclovir (VALTREX) 500 MG tablet, Take 1 tablet (500 mg total) by mouth daily as needed (for break outs)., Disp: 30 tablet, Rfl: 0 .  Vitamin D, Ergocalciferol, (DRISDOL) 50000 units CAPS capsule, Take 1 capsule (50,000 Units total) by mouth every 7 (seven) days., Disp: 12 capsule, Rfl: 10 .  amLODipine (NORVASC) 5 MG tablet, Take 1 tablet (5 mg total) by mouth daily., Disp: 90 tablet, Rfl: 3

## 2016-02-20 NOTE — Patient Instructions (Signed)
Keep taking the Symbicort as you are doing Keep using the albuterol as you're doing I will see you back in 6 months or sooner if needed

## 2016-02-20 NOTE — Assessment & Plan Note (Signed)
This has been a rough interval for [name] from an emotional standpoint, but fortunately her asthma hasn't given her too much trouble.  However she had an exacerbation at some point in the Summer, so I am not interested in decreasing the Symbicort right now.  Plan: Continue Symbicort 80/4.52 puffs twice a day Flu shot is up-to-date Continue albuterol as needed Follow-up 6 month

## 2016-02-29 ENCOUNTER — Ambulatory Visit (INDEPENDENT_AMBULATORY_CARE_PROVIDER_SITE_OTHER): Payer: Medicare Other | Admitting: Family Medicine

## 2016-02-29 ENCOUNTER — Encounter: Payer: Self-pay | Admitting: Family Medicine

## 2016-02-29 VITALS — BP 131/75 | HR 61 | Ht 63.0 in | Wt 142.5 lb

## 2016-02-29 DIAGNOSIS — R7301 Impaired fasting glucose: Secondary | ICD-10-CM | POA: Diagnosis not present

## 2016-02-29 DIAGNOSIS — R7303 Prediabetes: Secondary | ICD-10-CM

## 2016-02-29 DIAGNOSIS — I10 Essential (primary) hypertension: Secondary | ICD-10-CM

## 2016-02-29 DIAGNOSIS — F4323 Adjustment disorder with mixed anxiety and depressed mood: Secondary | ICD-10-CM

## 2016-02-29 DIAGNOSIS — E559 Vitamin D deficiency, unspecified: Secondary | ICD-10-CM

## 2016-02-29 LAB — POCT GLYCOSYLATED HEMOGLOBIN (HGB A1C): Hemoglobin A1C: 6

## 2016-02-29 NOTE — Assessment & Plan Note (Addendum)
6.0 today.  Doing well. This is down from 6.2 3-4 months ago.

## 2016-02-29 NOTE — Patient Instructions (Signed)
Preventing Type 2 Diabetes Mellitus Type 2 diabetes (type 2 diabetes mellitus) is a long-term (chronic) disease that affects blood sugar (glucose) levels. Normally, a hormone called insulin allows glucose to enter cells in the body. The cells use glucose for energy. In type 2 diabetes, one or both of these problems may be present:  The body does not make enough insulin.  The body does not respond properly to insulin that it makes (insulin resistance). Insulin resistance or lack of insulin causes excess glucose to build up in the blood instead of going into cells. As a result, high blood glucose (hyperglycemia) develops, which can cause many complications. Being overweight or obese and having an inactive (sedentary) lifestyle can increase your risk for diabetes. Type 2 diabetes can be delayed or prevented by making certain nutrition and lifestyle changes. What nutrition changes can be made?  Eat healthy meals and snacks regularly. Keep a healthy snack with you for when you get hungry between meals, such as fruit or a handful of nuts.  Eat lean meats and proteins that are low in saturated fats, such as chicken, fish, egg whites, and beans. Avoid processed meats.  Eat plenty of fruits and vegetables and plenty of grains that have not been processed (whole grains). It is recommended that you eat:  1?2 cups of fruit every day.  2?3 cups of vegetables every day.  6?8 oz of whole grains every day, such as oats, whole wheat, bulgur, brown rice, quinoa, and millet.  Eat low-fat dairy products, such as milk, yogurt, and cheese.  Eat foods that contain healthy fats, such as nuts, avocado, olive oil, and canola oil.  Drink water throughout the day. Avoid drinks that contain added sugar, such as soda or sweet tea.  Follow instructions from your health care provider about specific eating or drinking restrictions.  Control how much food you eat at a time (portion size).  Check food labels to find  out the serving sizes of foods.  Use a kitchen scale to weigh amounts of foods.  Saute or steam food instead of frying it. Cook with water or broth instead of oils or butter.  Limit your intake of:  Salt (sodium). Have no more than 1 tsp (2,400 mg) of sodium a day. If you have heart disease or high blood pressure, have less than ? tsp (1,500 mg) of sodium a day.  Saturated fat. This is fat that is solid at room temperature, such as butter or fat on meat. What lifestyle changes can be made?  Activity  Do moderate-intensity physical activity for at least 30 minutes on at least 5 days of the week, or as much as told by your health care provider.  Ask your health care provider what activities are safe for you. A mix of physical activities may be best, such as walking, swimming, cycling, and strength training.  Try to add physical activity into your day. For example:  Park in spots that are farther away than usual, so that you walk more. For example, park in a far corner of the parking lot when you go to the office or the grocery store.  Take a walk during your lunch break.  Use stairs instead of elevators or escalators. Weight Loss  Lose weight as directed. Your health care provider can determine how much weight loss is best for you and can help you lose weight safely.  If you are overweight or obese, you may be instructed to lose at least 5?7 % of  your body weight. Alcohol and Tobacco   Limit alcohol intake to no more than 1 drink a day for nonpregnant women and 2 drinks a day for men. One drink equals 12 oz of beer, 5 oz of wine, or 1 oz of hard liquor.  Do not use any tobacco products, such as cigarettes, chewing tobacco, and e-cigarettes. If you need help quitting, ask your health care provider. Work With [employer] Provider  Have your blood glucose tested regularly, as told by your health care provider.  Discuss your risk factors and how you can reduce your risk for  diabetes.  Get screening tests as told by your health care provider. You may have screening tests regularly, especially if you have certain risk factors for type 2 diabetes.  Make an appointment with a diet and nutrition specialist (registered dietitian). A registered dietitian can help you make a healthy eating plan and can help you understand portion sizes and food labels. Why are these changes important?  It is possible to prevent or delay type 2 diabetes and related health problems by making lifestyle and nutrition changes.  It can be difficult to recognize signs of type 2 diabetes. The best way to avoid possible damage to your body is to take actions to prevent the disease before you develop symptoms. What can happen if changes are not made?  Your blood glucose levels may keep increasing. Having high blood glucose for a long time is dangerous. Too much glucose in your blood can damage your blood vessels, heart, kidneys, nerves, and eyes.  You may develop prediabetes or type 2 diabetes. Type 2 diabetes can lead to many chronic health problems and complications, such as:  Heart disease.  Stroke.  Blindness.  Kidney disease.  Depression.  Poor circulation in the feet and legs, which could lead to surgical removal (amputation) in severe cases. Where to find support:  Ask your health care provider to recommend a registered dietitian, diabetes educator, or weight loss program.  Look for local or online weight loss groups.  Join a gym, fitness club, or outdoor activity group, such as a walking club. Where to find more information: To learn more about diabetes and diabetes prevention, visit:  American Diabetes Association (ADA): www.diabetes.[employer] of Diabetes and Digestive and Kidney Diseases: FindSpin.nl To learn more about healthy eating, visit:  The U.S. Department of Agriculture Scientist, research (physical sciences)), Choose My Plate:  http://wiley-williams.com/  Office of Disease Prevention and Health Promotion (ODPHP), Dietary Guidelines: SurferLive.at Summary  You can reduce your risk for type 2 diabetes by increasing your physical activity, eating healthy foods, and losing weight as directed.  Talk with your health care provider about your risk for type 2 diabetes. Ask about any blood tests or screening tests that you need to have. This information is not intended to replace advice given to you by your health care provider. Make sure you discuss any questions you have with your health care provider. Document Released: 07/03/2015 Document Revised: 08/17/2015 Document Reviewed: 05/02/2015 Elsevier Interactive Patient Education  2017 Elsevier Inc. Prediabetes Prediabetes is the condition of having a blood sugar (blood glucose) level that is higher than it should be, but not high enough for you to be diagnosed with type 2 diabetes. Having prediabetes puts you at risk for developing type 2 diabetes (type 2 diabetes mellitus). Prediabetes may be called impaired glucose tolerance or impaired fasting glucose. Prediabetes usually does not cause symptoms. Your health care provider can diagnose this condition with blood  tests. You may be tested for prediabetes if you are overweight and if you have at least one other risk factor for prediabetes. Risk factors for prediabetes include:  Having a family member with type 2 diabetes.  Being overweight or obese.  Being older than age 69.  Being of American-Indian, African-American, Hispanic/Latino, or Asian/Pacific Islander descent.  Having an inactive (sedentary) lifestyle.  Having a history of gestational diabetes or polycystic ovarian syndrome (PCOS).  Having low levels of good cholesterol (HDL-C) or high levels of blood fats (triglycerides).  Having high blood pressure. What is blood glucose and how is blood glucose measured?   Blood glucose refers  to the amount of glucose in your bloodstream. Glucose comes from eating foods that contain sugars and starches (carbohydrates) that the body breaks down into glucose. Your blood glucose level may be measured in mg/dL (milligrams per deciliter) or mmol/L (millimoles per liter).Your blood glucose may be checked with one or more of the following blood tests:  A fasting blood glucose (FBG) test. You will not be allowed to eat (you will fast) for at least 8 hours before a blood sample is taken.  A normal range for FBG is 70-100 mg/dl (3.9-5.6 mmol/L).  An A1c (hemoglobin A1c) blood test. This test provides information about blood glucose control over the previous 2?70months.  An oral glucose tolerance test (OGTT). This test measures your blood glucose twice:  After fasting. This is your baseline level.  Two hours after you drink a beverage that contains glucose. You may be diagnosed with prediabetes:  If your FBG is 100?125 mg/dL (5.6-6.9 mmol/L).  If your A1c level is 5.7?6.4%.  If your OGGT result is 140?199 mg/dL (7.8-11 mmol/L). These blood tests may be repeated to confirm your diagnosis. What happens if blood glucose is too high? The pancreas produces a hormone (insulin) that helps move glucose from the bloodstream into cells. When cells in the body do not respond properly to insulin that the body makes (insulin resistance), excess glucose builds up in the blood instead of going into cells. As a result, high blood glucose (hyperglycemia) can develop, which can cause many complications. This is a symptom of prediabetes. What can happen if blood glucose stays higher than normal for a long time? Having high blood glucose for a long time is dangerous. Too much glucose in your blood can damage your nerves and blood vessels. Long-term damage can lead to complications from diabetes, which may include:  Heart disease.  Stroke.  Blindness.  Kidney disease.  Depression.  Poor circulation  in the feet and legs, which could lead to surgical removal (amputation) in severe cases. How can prediabetes be prevented from turning into type 2 diabetes?   To help prevent type 2 diabetes, take the following actions:  Be physically active.  Do moderate-intensity physical activity for at least 30 minutes on at least 5 days of the week, or as much as told by your health care provider. This could be brisk walking, biking, or water aerobics.  Ask your health care provider what activities are safe for you. A mix of physical activities may be best, such as walking, swimming, cycling, and strength training.  Lose weight as told by your health care provider.  Losing 5-7% of your body weight can reverse insulin resistance.  Your health care provider can determine how much weight loss is best for you and can help you lose weight safely.  Follow a healthy meal plan. This includes eating lean proteins, complex  carbohydrates, fresh fruits and vegetables, low-fat dairy products, and healthy fats.  Follow instructions from your health care provider about eating or drinking restrictions.  Make an appointment to see a diet and nutrition specialist (registered dietitian) to help you create a healthy eating plan that is right for you.  Do not smoke or use any tobacco products, such as cigarettes, chewing tobacco, and e-cigarettes. If you need help quitting, ask your health care provider.  Take over-the-counter and prescription medicines as told by your health care provider. You may be prescribed medicines that help lower the risk of type 2 diabetes. This information is not intended to replace advice given to you by your health care provider. Make sure you discuss any questions you have with your health care provider. Document Released: 07/03/2015 Document Revised: 08/17/2015 Document Reviewed: 05/02/2015 Elsevier Interactive Patient Education  2017 [employer].

## 2016-02-29 NOTE — Assessment & Plan Note (Addendum)
Doing well despite holidays. Symptoms stable. Continue medications.

## 2016-02-29 NOTE — Progress Notes (Signed)
Impression and Recommendations:    1. Impaired fasting glucose   2. Vitamin D deficiency   3. Prediabetes- 6.2 in 8/17   4. Essential hypertension   5. Adjustment disorder with mixed anxiety and depressed mood      For [name]'s vitamin D we discussed that her diarrhea may be coming from that. She will see if her symptoms are worse after her 50,000 unit dose on Saturdays. She understands her symptoms would be worse on file Saturday and Sunday if it were from the vitamin D. Otherwise she will let us know in 3-4 months or sooner if need be  Adjustment disorder with mixed anxiety and depressed mood Doing well despite holidays. Symptoms stable. Continue medications.  Prediabetes- 6.0 12/17 6.0 today.  Doing well. This is down from 6.2 3-4 months ago.  Essential hypertension Blood pressure well controlled.  Continue current meds  Lifestyle modification in addition to medications discussed with [name]  Vitamin D deficiency Will take note to see if her loose stools are W after larger 50k IU tabs taken on Saturdays.   Will let us know.   If it is W--> then ONLY TAKE 5 K IU QD OTC.     New Prescriptions   No medications on file    Modified Medications   No medications on file    Discontinued Medications   No medications on file    The [name] was counseled, risk factors were discussed, anticipatory guidance given.  Gross side effects, risk and benefits, and alternatives of medications and treatment plan in general discussed with [name].  [name] is aware that all medications have potential side effects and we are unable to predict every side effect or drug-drug interaction that may occur.   [name] will call with any questions prior to using medication if they have concerns.  Expresses verbal understanding and consents to current therapy and treatment regimen.  No barriers to understanding were identified.  Red flag symptoms and signs discussed in detail.  [name]  expressed understanding regarding what to do in case of emergency\urgent symptoms  Return in about 4 months (around 06/29/2016) for Follow-up of current medical issues.  Please see AVS handed out to [name] at the end of our visit for further [name] instructions/ counseling done pertaining to today's office visit.    Note: This document was prepared using Dragon voice recognition software and may include unintentional dictation errors.   --------------------------------------------------------------------------------------------------------------------------------------------------------------------------------------------------------------------------------------------    Subjective:    CC:  Chief Complaint  [name] presents with  . Follow-up  . impaired fasting glucose    HPI: [name] is a 82 y.o. female who presents to [place] at [facility] today for issues as discussed below.   vit D causing diarhea.  Loose stools actually- not going in pants and can make it to [facility].  HOWever, her loose stools are NOT W after her Sat dose of 50KUnits PO.----> so we are not sure what is causing it. Denies new meds  Pre-dm:  Doing well.  Using [name]- personal trainer - she comes to her house.  Will be doing yoga. Etc.  Pt trying to be more active.   Mood:  Doing well currently.  Declines that she is having a difficult time, even around holidays.  She has not used CBT/ counselor yet which I again recommeneded   Wt Readings from Last 3 Encounters:  02/29/16 142 lb 8 oz (64.6 kg)  02/20/16 144 lb (65.3  kg)  02/09/16 144 lb 9.6 oz (65.6 kg)   BP Readings from Last 3 Encounters:  02/29/16 131/75  02/20/16 132/70  02/09/16 117/68   Pulse Readings from Last 3 Encounters:  02/29/16 61  02/20/16 83  02/09/16 90   BMI Readings from Last 3 Encounters:  02/29/16 25.24 kg/m  02/20/16 25.51 kg/m  02/09/16 25.61 kg/m     [name] Care Team    Relationship  Specialty Notifications Start End  [name], DO PCP - General Family Medicine  08/30/15   Ladene Artist, MD Referring Physician Gastroenterology  07/02/10   Myrlene Broker, MD Referring Physician Urology  07/02/10   Clent Jacks, MD Referring Physician Ophthalmology  07/02/10   Nobie Putnam, MD Referring Physician Hematology and Oncology  07/02/10   Latanya Maudlin, MD Referring Physician Orthopedic Surgery  07/02/10   Theodis Sato, MD (Inactive) Referring Physician Orthopedic Surgery  07/02/10   [name], MD Referring Physician Cardiology  07/02/10   Danella Sensing, MD Consulting Physician Dermatology  11/09/15   Juanito Doom, MD Consulting Physician Pulmonary Disease  11/09/15   Suella Broad, MD Consulting Physician Physical Medicine and Rehabilitation  11/09/15    Comment: pain mgt :  injections and pain meds  Rockne Menghini, [place] Pharmacist Cardiology  11/19/15    Comment:  " [facility]" - med mgt of her HTN per request of Dr Alvester Chou- Cards  Alda Berthold, [place] Physician Neurology  12/17/15     [name] Active Problem List   Diagnosis Date Noted  . Medication refused- (any cholesterol ones) 01/20/2016    Priority: High  . Prediabetes- 6.0 12/17 01/11/2016    Priority: High  . Adjustment disorder with mixed anxiety and depressed mood 01/11/2016    Priority: High  . Asthma, mild persistent 12/19/2008    Priority: High  . Hyperlipidemia 08/12/2007    Priority: High  . Essential hypertension 08/12/2007    Priority: High  . Chronic fatigue syndrome with fibromyalgia 01/20/2016    Priority: Medium  . Depression 11/24/2015    Priority: Medium  . B12 deficiency 08/30/2015    Priority: Medium  . Iron deficiency anemia 11/03/2009    Priority: Medium  . Anxiety state 08/12/2007    Priority: Medium  . Muscle pain, fibromyalgia 08/12/2007    Priority: Medium  . Environmental and seasonal allergies 01/20/2016    Priority: Low  . Vitamin D deficiency 01/19/2016     Priority: Low  . Fatigue 06/20/2015    Priority: Low  . Memory loss     Priority: Low  . Lumbar radiculopathy, chronic     Priority: Low  . LBBB (left bundle branch block) 11/13/2010    Priority: Low  . G E R D 08/12/2007    Priority: Low  . Type 2 diabetes mellitus without complication, without long-term current use of insulin ([place]) 01/28/2016  . Medication intolerance- statins- (make her FM much W) 01/10/2016  . Reaction, situational, acute, to stress(husband dying) 09/21/2015  . Dysuria 08/18/2015  . Herpes simplex labialis 04/03/2012  . Shingles 04/12/2011  . MIGRAINE HEADACHE 05/30/2010  . Allergic rhinitis 08/12/2007  . ARTHRITIS 08/12/2007    Past Medical history, Surgical history, Family history, Social history, Allergies and Medications have been entered into the medical record, reviewed and changed as needed.   Allergies:  Allergies  Allergen Reactions  . Morphine Sulfate Other (See Comments)    Makes her hyper    Review of Systems  Constitutional: Negative.  Negative for diaphoresis and weight loss.  HENT: Negative for nosebleeds.   Eyes: Negative for blurred vision and double vision.  Respiratory: Negative for shortness of breath and wheezing.   Cardiovascular: Negative for chest pain, palpitations, orthopnea and claudication.  Gastrointestinal: Negative for diarrhea, nausea and vomiting.  Musculoskeletal: Negative for falls and myalgias.  Skin: Negative for rash.  Neurological: Negative for dizziness and focal weakness.  Endo/Heme/Allergies: Negative for polydipsia.  Psychiatric/Behavioral: Negative for memory loss.     Objective:   Blood pressure 131/75, pulse 61, height 5\' 3"  (1.6 m), weight 142 lb 8 oz (64.6 kg). Body mass index is 25.24 kg/m. General: Well Developed, well nourished, appropriate for stated age.  Neuro: Alert and oriented x3, extra-ocular muscles intact, sensation grossly intact.  HEENT: Normocephalic, atraumatic, neck supple     Skin: Warm and dry, no gross rash. Cardiac: RRR, S1 S2,  no murmurs rubs or gallops.  Respiratory: ECTA B/L, Not using accessory muscles, speaking in full sentences-unlabored. Vascular:  No gross lower ext edema, cap RF less 2 sec. Psych: No HI/SI, judgement and insight good, Euthymic mood. Full Affect.

## 2016-03-04 ENCOUNTER — Ambulatory Visit (INDEPENDENT_AMBULATORY_CARE_PROVIDER_SITE_OTHER): Payer: Medicare Other | Admitting: Psychology

## 2016-03-04 ENCOUNTER — Encounter: Payer: Self-pay | Admitting: Psychology

## 2016-03-04 DIAGNOSIS — F411 Generalized anxiety disorder: Secondary | ICD-10-CM

## 2016-03-04 DIAGNOSIS — R413 Other amnesia: Secondary | ICD-10-CM | POA: Diagnosis not present

## 2016-03-04 NOTE — Progress Notes (Signed)
NEUROPSYCHOLOGICAL INTERVIEW (CPT: [id number])  Name: [name] Date of Birth: [birth year] Date of Interview: 03/04/2016  Reason for Referral:  [name] is a 82 y.o., widowed female who is referred for neuropsychological evaluation by Dr. Narda Amber of [facility] due to concerns about memory decline. This patient is unaccompanied in the office for her appointment today.  History of Presenting Problem:  [name] was last seen by Dr. Posey Pronto on 08/25/2015. She reported a concern of Alzheimer's disease based on her family history (father had significant memory loss in his mid 67s; 30yo sister has significant short term memory loss; mother lived to 45yo with no memory problems). The patient scored 26/30 on the [place]. MOCA=26/30. She also had significant anxiety and psychosocial stress at that time, in the context of caring for her husband with advanced Lewy body dementia. Her husband passed away on Sep 21, 2015.   The patient reported word finding difficulty and "minor" forgetfulness or attention lapses. When she is cooking, she will glance at the recipe to see the measurement required, and then forget what she just read.   Upon direct questioning, the patient reported the following:   Forgetting recent conversations/events: No Repeating statements/questions: Yes, I know I do that.  Misplacing/losing items: No, but I have to stop and think about where I put something. Forgetting appointments or other obligations: No, I use a calendar and review it weekly. Forgetting to take medications: No Difficulty concentrating: Possibly while I'm reading.  Starting but not finishing tasks: "I've done that all my life." Comprehension difficulty: No. But I may not remember something I've read as well as used to.  Getting lost when driving: No Uncertain about directions when driving or passenger: Yes, especially after she returned to driving after caring for her husband for 3 years.  The  patient lives alone (since her husband passed in 08/2015) and independently manages all instrumental ADLs including driving, medications, finances, appointments and cooking. She denied any difficulties managing these tasks.   The patient reported she has grieved the loss of her husband and, while she was quite depressed in the past, her mood is better now. Her energy level has increased since taking Vitamin B12 injections for several months. She noted a lifelong history of anxiety characterized by tension, physiological symptoms, impatience and panic attacks. Panic attacks are more infrequent now. She feels Lexapro is helping. She denied suicidal ideation or intention.  She also has chronic back pain. She has difficulty pacing herself and taking breaks when doing activities, which can exacerbate the pain. Her pain greatly interferes with her quality of life and mood.  She has had a few falls in the past year but denied any significant injuries. She noted that one leg is longer than the other since having back surgery. She wears a lift in one shoe.   In the past, she has had difficulty falling asleep. She started taking melatonin and it is helping. Her appetite was reduced in the past and she lost 20 lbs unintentionally. She feels she is now eating a normal amount but she is not gaining any of the weight she lost.    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  Marital history: Widowed.  Was married 106 years. Two children (daughter-closeby, son-[place]) and one grandchild.  Daughter and her family live closeby and they have been wonderful.  Alcohol/Tobacco/Substances: No alcohol. Former smoker (only when  in college). No SA.    Medical History: Past Medical History:  Diagnosis Date  . ALLERGIC RHINITIS   . ANEMIA, IRON DEFICIENCY   . ANXIETY   . ARTHRITIS    s/p bilateral THR  . ASTHMA, EXTRINSIC   . Bronchitis   . Cataracts, both eyes   . Chronic constipation   . DIABETES MELLITUS, TYPE II   . FIBROMYALGIA    fibromyalgia  . G E R D   . High cholesterol   . HYPERLIPIDEMIA   . HYPERTENSION   . Interstitial cystitis   . MIGRAINE HEADACHE      Current Medications:  Outpatient Encounter Prescriptions as of 03/04/2016  Medication Sig  . acetaminophen (TYLENOL) 500 MG tablet Take 1,000 mg by mouth every 6 (six) hours as needed. For pain  . albuterol (PROAIR HFA) 108 (90 BASE) MCG/ACT inhaler Inhale 2 puffs into the lungs every 6 (six) hours as needed.  Marland Kitchen amLODipine (NORVASC) 5 MG tablet Take 1 tablet (5 mg total) by mouth daily.  . budesonide-formoterol (SYMBICORT) 80-4.5 MCG/ACT inhaler Inhale 2 puffs into the lungs 2 (two) times daily.  . cetirizine (ZYRTEC) 10 MG tablet Take 10 mg by mouth daily.   . Cholecalciferol (VITAMIN D3) 5000 units TABS 5,000 IU OTC vitamin D3 daily.  . diazepam (VALIUM) 5 MG tablet 1/2 tab q 12 hrs PRN SEVERE panic anxiety  . Docusate Sodium (STOOL SOFTENER) 100 MG capsule Take 100 mg by mouth daily.    . DULoxetine (CYMBALTA) 30 MG capsule Take 1 capsule (30 mg total) by mouth daily.  Marland Kitchen escitalopram (LEXAPRO) 10 MG tablet Take 1 tablet (10 mg total) by mouth daily.  . fluticasone (FLONASE) 50 MCG/ACT nasal spray USE 2 SPRAYS INTO EACH NOSTRIL EVERY DAY  . losartan (COZAAR) 100 MG tablet Take 1 tablet (100 mg total) by mouth daily.  . meloxicam (MOBIC) 15 MG tablet Take 1 tablet (15 mg total) by mouth daily.  . montelukast (SINGULAIR) 10 MG tablet Take 1 tablet (10 mg total) by mouth at bedtime.  . traMADol (ULTRAM) 50 MG tablet Take 50 mg by mouth every 6 (six) hours as needed. Reported on 05/23/2015  . valACYclovir (VALTREX) 500 MG tablet Take 1  tablet (500 mg total) by mouth daily as needed (for break outs).  . Vitamin D, Ergocalciferol, (DRISDOL) 50000 units CAPS capsule Take 1 capsule (50,000 Units total) by mouth every 7 (seven) days.   No facility-administered encounter medications on file as of 03/04/2016.      Behavioral Observations:   Appearance: Neatly and appropriately dressed and groomed. Appears younger than her chronological age. Gait: Ambulated independently, no abnormalities observed Speech: Fluent; normal rate, rhythm and volume. Thought process: Linear, goal directed Affect: Full, stable, appropriate Interpersonal: Pleasant, appropriate   TESTING: There is medical necessity to proceed with neuropsychological assessment as the results will be used to aid in differential diagnosis and clinical decision-making and to inform specific treatment recommendations. Per the patient and medical records reviewed, there has been a change in cognitive functioning and a reasonable suspicion of a neurocognitive disorder such as MCI.   PLAN: The patient will return for a full battery of neuropsychological testing with a psychometrician under my supervision. Education regarding testing procedures was provided. Subsequently, the patient will see this provider for  a follow-up session at which time her test performances and my impressions and treatment recommendations will be reviewed in detail.   Full neuropsychological evaluation report to follow.

## 2016-03-04 NOTE — Assessment & Plan Note (Signed)
Blood pressure well controlled.  Continue current meds  Lifestyle modification in addition to medications discussed with patient

## 2016-03-04 NOTE — Assessment & Plan Note (Signed)
Will take note to see if her loose stools are W after larger 50k IU tabs taken on Saturdays.   Will let us know.   If it is W--> then ONLY TAKE 5 K IU QD OTC.

## 2016-03-06 DIAGNOSIS — M5136 Other intervertebral disc degeneration, lumbar region: Secondary | ICD-10-CM | POA: Diagnosis not present

## 2016-03-11 ENCOUNTER — Ambulatory Visit (INDEPENDENT_AMBULATORY_CARE_PROVIDER_SITE_OTHER): Payer: Medicare Other

## 2016-03-11 ENCOUNTER — Other Ambulatory Visit: Payer: Self-pay

## 2016-03-11 ENCOUNTER — Telehealth: Payer: Self-pay

## 2016-03-11 VITALS — BP 146/76 | HR 85 | Wt 142.0 lb

## 2016-03-11 DIAGNOSIS — D510 Vitamin B12 deficiency anemia due to intrinsic factor deficiency: Secondary | ICD-10-CM | POA: Diagnosis not present

## 2016-03-11 DIAGNOSIS — G8929 Other chronic pain: Secondary | ICD-10-CM

## 2016-03-11 DIAGNOSIS — M549 Dorsalgia, unspecified: Principal | ICD-10-CM

## 2016-03-11 MED ORDER — MONTELUKAST SODIUM 10 MG PO TABS: 10.0000 mg | ORAL_TABLET | Freq: Every day | ORAL | 0 refills | Status: DC

## 2016-03-11 MED ORDER — CYANOCOBALAMIN 1000 MCG/ML IJ SOLN
1000.0000 ug | Freq: Once | INTRAMUSCULAR | Status: AC
  Administered 2016-03-11: 1000 ug via INTRAMUSCULAR

## 2016-03-11 MED ORDER — DIAZEPAM 5 MG PO TABS: ORAL_TABLET | ORAL | 0 refills | Status: DC

## 2016-03-11 NOTE — Progress Notes (Signed)
Pt is here for a vitamin B12 injection.  Pt denies gastrointestinal problems or dizziness.  Patient tolerated injection well without complications.  Patient advised to schedule next injection in 30 days.  T. Nelson, CMA  

## 2016-03-11 NOTE — Telephone Encounter (Signed)
Pt in today for her B12 injection.  Pt states that Dr. Raliegh Scarlet requested she call [facility] to inquire about an appointment with them for her chronic back pain.  Pt states she contacted them and their office stated that pt must have a referral.  Referral placed.  Charyl Bigger, CMA

## 2016-04-02 ENCOUNTER — Other Ambulatory Visit: Payer: Self-pay

## 2016-04-02 ENCOUNTER — Ambulatory Visit (INDEPENDENT_AMBULATORY_CARE_PROVIDER_SITE_OTHER): Payer: Medicare Other | Admitting: Psychology

## 2016-04-02 DIAGNOSIS — R413 Other amnesia: Secondary | ICD-10-CM | POA: Diagnosis not present

## 2016-04-02 MED ORDER — MELOXICAM 15 MG PO TABS: 15.0000 mg | ORAL_TABLET | Freq: Every day | ORAL | 0 refills | Status: DC

## 2016-04-02 NOTE — Progress Notes (Signed)
   Neuropsychology Note  [name] returned today for 2 hours of neuropsychological testing with technician, Milana Kidney, BS, under the supervision of Dr. Macarthur Critchley. The patient did not appear overtly distressed by the testing session, per behavioral observation or via self-report to the technician. Rest breaks were offered. [name] will return within 2 weeks for a feedback session with Dr. Si Raider at which time her test performances, clinical impressions and treatment recommendations will be reviewed in detail. The patient understands she can contact our office should she require our assistance before this time.  Full report to follow.

## 2016-04-09 DIAGNOSIS — Z5181 Encounter for therapeutic drug level monitoring: Secondary | ICD-10-CM | POA: Diagnosis not present

## 2016-04-09 DIAGNOSIS — M5136 Other intervertebral disc degeneration, lumbar region: Secondary | ICD-10-CM | POA: Diagnosis not present

## 2016-04-09 DIAGNOSIS — Z79899 Other long term (current) drug therapy: Secondary | ICD-10-CM | POA: Diagnosis not present

## 2016-04-09 DIAGNOSIS — M5416 Radiculopathy, lumbar region: Secondary | ICD-10-CM | POA: Diagnosis not present

## 2016-04-09 DIAGNOSIS — G894 Chronic pain syndrome: Secondary | ICD-10-CM | POA: Diagnosis not present

## 2016-04-12 ENCOUNTER — Ambulatory Visit: Payer: Medicare Other

## 2016-04-15 ENCOUNTER — Encounter: Payer: Medicare Other | Admitting: Psychology

## 2016-04-16 ENCOUNTER — Ambulatory Visit (INDEPENDENT_AMBULATORY_CARE_PROVIDER_SITE_OTHER): Payer: Medicare Other

## 2016-04-16 VITALS — BP 96/61 | HR 81 | Wt 142.7 lb

## 2016-04-16 DIAGNOSIS — E559 Vitamin D deficiency, unspecified: Secondary | ICD-10-CM | POA: Diagnosis not present

## 2016-04-16 MED ORDER — CYANOCOBALAMIN 1000 MCG/ML IJ SOLN
1000.0000 ug | Freq: Once | INTRAMUSCULAR | Status: AC
  Administered 2016-04-16: 1000 ug via INTRAMUSCULAR

## 2016-04-16 NOTE — Progress Notes (Signed)
HPI:  Pt is here for a vitamin B12 injection.  Pt denies dizziness.  Pt states that she does have bouts of diarrhea and constipation, but this has a chronic problem.  Assessment and Plan:  Patient tolerated injection well without complications.  Patient advised to schedule next injection in 30 days.

## 2016-04-17 DIAGNOSIS — Z4789 Encounter for other orthopedic aftercare: Secondary | ICD-10-CM | POA: Diagnosis not present

## 2016-04-17 DIAGNOSIS — M47817 Spondylosis without myelopathy or radiculopathy, lumbosacral region: Secondary | ICD-10-CM | POA: Diagnosis not present

## 2016-04-17 DIAGNOSIS — M5135 Other intervertebral disc degeneration, thoracolumbar region: Secondary | ICD-10-CM | POA: Diagnosis not present

## 2016-04-17 DIAGNOSIS — M5137 Other intervertebral disc degeneration, lumbosacral region: Secondary | ICD-10-CM | POA: Diagnosis not present

## 2016-04-17 DIAGNOSIS — M47816 Spondylosis without myelopathy or radiculopathy, lumbar region: Secondary | ICD-10-CM | POA: Diagnosis not present

## 2016-04-17 DIAGNOSIS — M47815 Spondylosis without myelopathy or radiculopathy, thoracolumbar region: Secondary | ICD-10-CM | POA: Diagnosis not present

## 2016-04-17 DIAGNOSIS — M5126 Other intervertebral disc displacement, lumbar region: Secondary | ICD-10-CM | POA: Diagnosis not present

## 2016-04-23 ENCOUNTER — Encounter: Payer: Medicare Other | Admitting: Psychology

## 2016-04-23 DIAGNOSIS — G894 Chronic pain syndrome: Secondary | ICD-10-CM | POA: Diagnosis not present

## 2016-04-23 DIAGNOSIS — M5416 Radiculopathy, lumbar region: Secondary | ICD-10-CM | POA: Diagnosis not present

## 2016-04-23 DIAGNOSIS — M961 Postlaminectomy syndrome, not elsewhere classified: Secondary | ICD-10-CM | POA: Diagnosis not present

## 2016-04-24 NOTE — Progress Notes (Signed)
NEUROPSYCHOLOGICAL EVALUATION   Name:    [name]  Date of Birth:   06-16-1933 Date of Interview:  03/04/2016 Date of Testing:  04/02/2016   Date of Feedback:  04/25/2016       Background Information:  Reason for Referral:  [name] is a 83 y.o., widowed female referred by Dr. Narda Amber to assess her current level of cognitive functioning and assist in differential diagnosis. The current evaluation consisted of a review of available medical records, an interview with the patient, and the completion of a neuropsychological testing battery. Informed consent was obtained.  History of Presenting Problem:  [name] was last seen by Dr. Posey Pronto on 08/25/2015. She reported a concern of Alzheimer's disease based on her family history (father had significant memory loss in his mid 38s; 81yo sister has significant short term memory loss; mother lived to 23yo with no memory problems). The patient scored 26/30 on the [name]. She also had significant anxiety and psychosocial stress at that time, in the context of caring for her husband with advanced Lewy body dementia. Her husband passed away on Sep 19, 2015.   The patient reported word finding difficulty and "minor" forgetfulness or attention lapses. When she is cooking, she will glance at the recipe to see the measurement required, and then forget what she just read.   Upon direct questioning, the patient reported the following:   Forgetting recent conversations/events: No Repeating statements/questions: Yes, I know I do that.  Misplacing/losing items: No, but I have to stop and think about where I put something. Forgetting appointments or other obligations: No, I use a calendar and review it weekly. Forgetting to take medications: No Difficulty concentrating: Possibly while I'm reading.  Starting but not finishing tasks: "I've done that all my life." Comprehension difficulty: No. But I may not remember something I've read as well  as used to.  Getting lost when driving: No Uncertain about directions when driving or passenger: Yes, especially after she returned to driving after caring for her husband for 3 years.  The patient lives alone (since her husband passed in 08/2015) and independently manages all instrumental ADLs including driving, medications, finances, appointments and cooking. She denied any difficulties managing these tasks.   The patient reported she has grieved the loss of her husband and, while she was quite depressed in the past, her mood is better now. Her energy level has increased since taking Vitamin B12 injections for several months. She noted a lifelong history of anxiety characterized by tension, physiological symptoms, impatience and panic attacks. Panic attacks are more infrequent now. She feels Lexapro is helping. She denied suicidal ideation or intention.  She also has chronic back pain. She has difficulty pacing herself and taking breaks when doing activities, which can exacerbate the pain. Her pain greatly interferes with her quality of life and mood.  She has had a few falls in the past year but denied any significant injuries. She noted that one leg is longer than the other since having back surgery. She wears a lift in one shoe.   In the past, she has had difficulty falling asleep. She started taking melatonin and it is helping. Her appetite was reduced in the past and she lost 20 lbs unintentionally. She feels she is now eating a normal amount but she is not gaining any of the weight she lost.    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  Marital history: Widowed. Was married 76 years.  Two children (daughter-closeby, son-[place]) and one grandchild.  Daughter and her family live closeby and "they have been wonderful".  Alcohol/Tobacco/Substances: No alcohol. Former smoker (only when in college). No SA.    Medical History:  Past Medical History:  Diagnosis Date  . ALLERGIC RHINITIS   . ANEMIA, IRON DEFICIENCY   . ANXIETY   . ARTHRITIS    s/p bilateral THR  . ASTHMA, EXTRINSIC   . Bronchitis   . Cataracts, both eyes   . Chronic constipation   . DIABETES MELLITUS, TYPE II   . FIBROMYALGIA    fibromyalgia  . G E R D   . High cholesterol   . HYPERLIPIDEMIA   . HYPERTENSION   . Interstitial cystitis   . MIGRAINE HEADACHE     Current medications:  Outpatient Encounter Prescriptions as of 04/25/2016  Medication Sig  . acetaminophen (TYLENOL) 500 MG tablet Take 1,000 mg by mouth every 6 (six) hours as needed. For pain  . albuterol (PROAIR HFA) 108 (90 BASE) MCG/ACT inhaler Inhale 2 puffs into the lungs every 6 (six) hours as needed.  Marland Kitchen amLODipine (NORVASC) 10 MG tablet Take 1 tablet by mouth daily.  Marland Kitchen amLODipine (NORVASC) 5 MG tablet Take 1 tablet (5 mg total) by mouth daily.  . budesonide-formoterol (SYMBICORT) 80-4.5 MCG/ACT inhaler Inhale 2 puffs into the lungs 2 (two) times daily.  . cetirizine (ZYRTEC) 10 MG tablet Take 10 mg by mouth daily.   . Cholecalciferol (VITAMIN D3) 5000 units TABS 5,000 IU OTC vitamin D3 daily.  . diazepam (VALIUM) 5 MG tablet 1/2 tab q 12 hrs PRN SEVERE panic anxiety  . Docusate Sodium (STOOL SOFTENER) 100 MG capsule Take 100 mg by mouth daily.    . DULoxetine (CYMBALTA) 30 MG capsule Take 1 capsule (30 mg total) by mouth daily.  Marland Kitchen escitalopram (LEXAPRO) 10 MG tablet Take 1 tablet (10 mg total) by mouth daily.  . fluticasone (FLONASE) 50 MCG/ACT nasal spray USE 2 SPRAYS INTO EACH NOSTRIL EVERY DAY  . losartan (COZAAR) 100 MG tablet Take 1 tablet (100 mg total) by mouth daily.  . meloxicam (MOBIC) 15 MG tablet Take 1 tablet (15 mg  total) by mouth daily.  . montelukast (SINGULAIR) 10 MG tablet Take 1 tablet (10 mg total) by mouth at bedtime.  . traMADol (ULTRAM) 50 MG tablet Take 50 mg by mouth every 6 (six) hours as needed. Reported on 05/23/2015  . valACYclovir (VALTREX) 500 MG tablet Take 1 tablet (500 mg total) by mouth daily as needed (for break outs).  . Vitamin D, Ergocalciferol, (DRISDOL) 50000 units CAPS capsule Take 1 capsule (50,000 Units total) by mouth every 7 (seven) days.   No facility-administered encounter medications on file as of 04/25/2016.      Current Examination:  Behavioral Observations:  Appearance: Neatly and appropriately dressed and groomed. Appears younger than her chronological age. Gait: Ambulated independently, no abnormalities observed Speech: Fluent; normal rate, rhythm and volume. Thought process: Linear, goal directed Affect: Full, stable, appropriate Interpersonal: Pleasant, appropriate Orientation: Oriented to person, place and most aspects of time including month, year and day (disoriented to date). Accurately named current President but inaccurately named his predecessor as Clinical research associate.  Tests Administered: . Test  of Premorbid Functioning (TOPF) . Wechsler Adult Intelligence Scale-Fourth Edition (WAIS-IV): Similarities, Block Design, Matrix Reasoning, Coding and Digit Span subtests . Wechsler Memory Scale-Fourth Edition (WMS-IV)  (Adult 16-69): Logical Memory I, II and Recognition subtests  . [employer] Verbal Learning Test - 2nd Edition (CVLT-2) Short Form . Repeatable Battery for the Assessment of Neuropsychological Status (RBANS) Form A:  Figure Copy and Recall subtests and Semantic Fluency subtest . Controlled Oral Word Association Test (COWAT) . Trail Making Test A and B . Clock drawing test . [place] (BNT) . Generalized Anxiety Disorder - 7 item screener (GAD-7) . Beck Depression Inventory - Second edition (BDI-II) . Inventory of Complicated Grief  (ICG)   Test Results: Note: Standardized scores are presented only for use by appropriately trained professionals and to allow for any future test-retest comparison. These scores should not be interpreted without consideration of all the information that is contained in the rest of the report. The most recent standardization samples from the test publisher or other sources were used whenever possible to derive standard scores; scores were corrected for age, gender, ethnicity and education when available.  Please note that the wrong version of the WMS-IV Logical Memory subtest was administered to this patient; she was administered the Adult Form which is for ages 38-69; therefore her performances on this test were compared to 81 year old normative sample and may misrepresent her true abilities.   Test Scores:  Test Name Raw Score Standardized Score Descriptor  TOPF 46/70 SS= 104 Average  WAIS-IV Subtests     Similarities 23/36 ss= 11 Average  Block Design 29/66 ss= 11 Average  Matrix Reasoning 15/26 ss= 14 Superior  Coding 60/135 ss= 14 Superior  Digit Span Forward 14/16 ss= 17 Very superior  Digit Span Backward 11/16 ss= 15 Superior  WMS-IV Subtests     LM I 31/50 ss= 12 High average  LM II 25/50 ss= 12 High average  LM II Recognition 26/30 Cum %: >75 Above average  RBANS Subtests     Figure Copy 15/20 Z= -1.2 Low average  Figure Recall 10/20 Z= -0.3 Average  Semantic Fluency 22 Z= 1.2 High average  CVLT-II Scores     Trial 1 6/9 Z= 0.5 Average  Trial 4 6/9 Z= -1 Low average  Trials 1-4 total 27/36 T= 55 Average  SD Free Recall 7/9 Z= 0.5 Average  LD Free Recall 7/9 Z= 0.5 Average  LD Cued Recall 8/9 Z= 1 High average  Recognition Discriminability 9/9 hits, 1 false positive Z= 0.5 Average  Forced Choice Recognition 9/9  WNL  COWAT-FAS 49 T= 61 High average  COWAT-Animals 15 T= 47 Average  Trail Making Test A  23" 0 errors T= 65 Superior  Trail Making Test B  66" 0 errors T=  60 High average  Clock Drawing   WNL   BNT 59/60 T= 62 High average  GAD-7 9/21  Mild   BDI-II 12/63  WNL  ICG 10/76  Below cutoff      Description of Test Results:  Premorbid verbal intellectual abilities were estimated to have been within the average range based on a test of word reading. Psychomotor processing speed was superior. Auditory attention and working memory were very superior to superior. Visual-spatial construction was average to low average. Language abilities were intact. Specifically, confrontation naming was high average, and semantic verbal fluency was average to high average. With regard to verbal memory, encoding and acquisition of non-contextual information (i.e., word list) was average.  After a brief distracter task, free recall was average. After a delay, free recall was average. Cued recall was high average. Performance on a yes/no recognition task was average. On another verbal memory test, encoding and acquisition of contextual auditory information (i.e., short stories) was high average (even though she was compared to 81yo normative sample because the wrong form was administered). After a delay, free recall was high average (again, relative to 81yo normative sample). Performance on yes/no recognition task was above average. With regard to non-verbal memory, delayed free recall of visual information was average. Executive functioning was intact overall. Mental flexibility and set-shifting were high average on Trails B. Verbal fluency with phonemic search restrictions was high average. Verbal abstract reasoning was average. Non-verbal abstract reasoning was superior. Performance on a clock drawing task was intact. On a self-report report of mood, the patient's responses were not indicative of clinically significant depression at the present time. On another self-report measure, she did endorse mild generalized anxiety characterized by difficulty relaxing, worrying too much about  different things, feelings of nervousness, inability to control worrying, restlessness, and irritability. On another measure, there was no evidence of grief related depression.   Clinical Impressions: No cognitive disorder. Mild anxiety. Results of cognitive testing were entirely within normal limits. There were no areas of impairment, and all performances were at least average, with many scores in the high average to very superior range. There is evidence of mild generalized anxiety which appears to be longstanding in nature and improved recently with Lexapro.  There is no evidence of cognitive disorder, dementia or underlying Alzheimer's disease at the present time. The patient's previous cognitive complaints were most likely secondary to psychosocial stressors. Any changes she perceives now are likely due to normal aging.    Recommendations/Plan: Based on the findings of the present evaluation, the following recommendations are offered:   1. Continue Lexapro for anxiety.  2. Continue to engage in activities that provide mental stimulation, safe cardiovascular exercise and social interaction. This well help not only with emotional wellbeing but also brain health.   3. The patient will be reassured that her cognitive test results were entirely within normal limits and not indicative of a cognitive disorder or dementia at this time. These test results will serve as a nice baseline for future comparison if needed at any time.   Feedback to Patient: AIREN GOLDRICK and her daughter returned for a feedback appointment on 04/25/2016 to review the results of her neuropsychological evaluation with this provider. 25 minutes face-to-face time was spent reviewing her test results, my impressions and my recommendations as detailed above.    Total time spent on this patient's case: [id number] unit for interview with psychologist; [phone] units of testing by psychometrician under psychologist's supervision;  [phone] units for medical record review, scoring of neuropsychological tests, interpretation of test results, preparation of this report, and review of results to the patient by psychologist.      Thank you for your referral of [facility]. Please feel free to contact me if you have any questions or concerns regarding this report.

## 2016-04-25 ENCOUNTER — Ambulatory Visit (INDEPENDENT_AMBULATORY_CARE_PROVIDER_SITE_OTHER): Payer: Medicare Other | Admitting: Psychology

## 2016-04-25 ENCOUNTER — Encounter: Payer: Self-pay | Admitting: Psychology

## 2016-04-25 DIAGNOSIS — R413 Other amnesia: Secondary | ICD-10-CM

## 2016-04-25 DIAGNOSIS — F419 Anxiety disorder, unspecified: Secondary | ICD-10-CM

## 2016-04-25 NOTE — Patient Instructions (Signed)
Results of cognitive testing were entirely within normal limits. There were no areas of impairment, and all performances were at least average, with many scores in the high average to very superior range. There is evidence of mild generalized anxiety which appears to be longstanding in nature and improved recently with Lexapro.  There is no evidence of cognitive disorder, dementia or underlying Alzheimer's disease at the present time. The patient's previous cognitive complaints were most likely secondary to psychosocial stressors. Any changes she perceives now are likely due to normal aging.    Recommendations/Plan: Based on the findings of the present evaluation, the following recommendations are offered:   1. Continue Lexapro for anxiety.  2. Continue to engage in activities that provide mental stimulation, safe cardiovascular exercise and social interaction. This well help not only with emotional wellbeing but also brain health.   3. The patient will be reassured that her cognitive test results were entirely within normal limits and not indicative of a cognitive disorder or dementia at this time. These test results will serve as a nice baseline for future comparison if needed at any time.

## 2016-04-29 DIAGNOSIS — M961 Postlaminectomy syndrome, not elsewhere classified: Secondary | ICD-10-CM | POA: Diagnosis not present

## 2016-04-29 DIAGNOSIS — M62838 Other muscle spasm: Secondary | ICD-10-CM | POA: Diagnosis not present

## 2016-04-30 DIAGNOSIS — G894 Chronic pain syndrome: Secondary | ICD-10-CM | POA: Diagnosis not present

## 2016-04-30 DIAGNOSIS — M545 Low back pain: Secondary | ICD-10-CM | POA: Diagnosis not present

## 2016-05-02 DIAGNOSIS — G894 Chronic pain syndrome: Secondary | ICD-10-CM | POA: Diagnosis not present

## 2016-05-02 DIAGNOSIS — M545 Low back pain: Secondary | ICD-10-CM | POA: Diagnosis not present

## 2016-05-06 DIAGNOSIS — G894 Chronic pain syndrome: Secondary | ICD-10-CM | POA: Diagnosis not present

## 2016-05-06 DIAGNOSIS — M545 Low back pain: Secondary | ICD-10-CM | POA: Diagnosis not present

## 2016-05-10 DIAGNOSIS — M545 Low back pain: Secondary | ICD-10-CM | POA: Diagnosis not present

## 2016-05-10 DIAGNOSIS — G894 Chronic pain syndrome: Secondary | ICD-10-CM | POA: Diagnosis not present

## 2016-05-13 DIAGNOSIS — M545 Low back pain: Secondary | ICD-10-CM | POA: Diagnosis not present

## 2016-05-13 DIAGNOSIS — G894 Chronic pain syndrome: Secondary | ICD-10-CM | POA: Diagnosis not present

## 2016-05-17 ENCOUNTER — Telehealth: Payer: Self-pay

## 2016-05-17 ENCOUNTER — Ambulatory Visit (INDEPENDENT_AMBULATORY_CARE_PROVIDER_SITE_OTHER): Payer: Medicare Other

## 2016-05-17 VITALS — BP 123/68 | HR 81 | Wt 138.0 lb

## 2016-05-17 DIAGNOSIS — M545 Low back pain: Secondary | ICD-10-CM | POA: Diagnosis not present

## 2016-05-17 DIAGNOSIS — R198 Other specified symptoms and signs involving the digestive system and abdomen: Secondary | ICD-10-CM

## 2016-05-17 DIAGNOSIS — D510 Vitamin B12 deficiency anemia due to intrinsic factor deficiency: Secondary | ICD-10-CM

## 2016-05-17 DIAGNOSIS — G894 Chronic pain syndrome: Secondary | ICD-10-CM | POA: Diagnosis not present

## 2016-05-17 MED ORDER — CYANOCOBALAMIN 1000 MCG/ML IJ SOLN
1000.0000 ug | Freq: Once | INTRAMUSCULAR | Status: AC
  Administered 2016-05-17: 1000 ug via INTRAMUSCULAR

## 2016-05-17 NOTE — Telephone Encounter (Signed)
Patient in today for B12 injection.  Pt again states that she continues to have small bowel movements, which has been a persistent problem since before beginning B12 injections.  Pt request referral to GI.  Also provided pt with IFOB test kit to screen for occult blood.  Kit explained to pt and she expressed understanding and is agreeable.  May we place referral to GI?  Please advise.  Charyl Bigger, CMA

## 2016-05-17 NOTE — Progress Notes (Signed)
HPI:  Patient is here for Vitamin B12 injection.  Patient denies any new GI problems or dizziness.  Assessment and Plan:  Patient tolerated injection well without complications.  Patient advised to schedule next injection in 30 days.  Charyl Bigger, CMA

## 2016-05-20 ENCOUNTER — Encounter: Payer: Self-pay | Admitting: Gastroenterology

## 2016-05-20 DIAGNOSIS — G894 Chronic pain syndrome: Secondary | ICD-10-CM | POA: Diagnosis not present

## 2016-05-20 DIAGNOSIS — M545 Low back pain: Secondary | ICD-10-CM | POA: Diagnosis not present

## 2016-05-20 NOTE — Telephone Encounter (Signed)
Referral placed.  LVM for pt informing her of referral.  [name], CMA

## 2016-05-20 NOTE — Telephone Encounter (Signed)
[facility] for GI Referral! Thanks! Valetta Fuller

## 2016-05-23 ENCOUNTER — Other Ambulatory Visit (INDEPENDENT_AMBULATORY_CARE_PROVIDER_SITE_OTHER): Payer: Medicare Other

## 2016-05-23 DIAGNOSIS — Z1211 Encounter for screening for malignant neoplasm of colon: Secondary | ICD-10-CM | POA: Diagnosis not present

## 2016-05-23 LAB — IFOBT (OCCULT BLOOD)
IFOBT: NEGATIVE
IFOBT: NEGATIVE
IFOBT: NEGATIVE

## 2016-05-24 ENCOUNTER — Other Ambulatory Visit: Payer: Self-pay | Admitting: Family Medicine

## 2016-05-24 DIAGNOSIS — G894 Chronic pain syndrome: Secondary | ICD-10-CM | POA: Diagnosis not present

## 2016-05-24 DIAGNOSIS — M545 Low back pain: Secondary | ICD-10-CM | POA: Diagnosis not present

## 2016-05-27 DIAGNOSIS — G894 Chronic pain syndrome: Secondary | ICD-10-CM | POA: Diagnosis not present

## 2016-05-27 DIAGNOSIS — M545 Low back pain: Secondary | ICD-10-CM | POA: Diagnosis not present

## 2016-05-27 NOTE — Telephone Encounter (Signed)
Neurologist encounter 04/2016-negative for dementia, Alzheimer's.

## 2016-05-27 NOTE — Telephone Encounter (Signed)
This medication is not on pt's medication list.  Please refill if appropriate. Charyl Bigger, CMA

## 2016-05-28 ENCOUNTER — Encounter: Payer: Self-pay | Admitting: Cardiovascular Disease

## 2016-05-28 ENCOUNTER — Ambulatory Visit (INDEPENDENT_AMBULATORY_CARE_PROVIDER_SITE_OTHER): Payer: Medicare Other | Admitting: Cardiovascular Disease

## 2016-05-28 DIAGNOSIS — I10 Essential (primary) hypertension: Secondary | ICD-10-CM | POA: Diagnosis not present

## 2016-05-28 DIAGNOSIS — E7800 Pure hypercholesterolemia, unspecified: Secondary | ICD-10-CM | POA: Diagnosis not present

## 2016-05-28 MED ORDER — AMLODIPINE BESYLATE 10 MG PO TABS: 5.0000 mg | ORAL_TABLET | Freq: Every day | ORAL | 3 refills | Status: DC

## 2016-05-28 NOTE — Progress Notes (Signed)
05/28/2016 [name]   08/15/1933  [id number]  Primary Physician Sonya Dance, DO Primary Cardiologist: Sonya Harp MD [name]  HPI:   [name] is an 83 year old mildly-overweight married Caucasian female, mother of 3 and grandmother of 1 grandchild, who works as a Acupuncturist. I last saw her in the office 08/09/15. Her husband [name] was a patient of mine as well unfortunately had  [place] body dementia and passed away in Oct 14, 2022 of last year. She lives  by herself with her daughter [name] who lives 2 miles away and a son who lives up in [place]..Her risk factors include type 2 diabetes, hypertension, and hyperlipidemia. She does have fibromyalgia. She has had a normal 2D echo and Myoview stress test in the past. She is otherwise asymptomatic. Since I saw her a year and a half ago she's remained stable. She is somewhat emotional and anxious given her home situation Blood pressure has been running significantly higher at homeas well. She denies chest pain or shortness of breath. Since I saw her almost a year ago she has developed some ankle edema primarily at the end of the day   Current Outpatient Prescriptions  Medication Sig Dispense Refill  . acetaminophen (TYLENOL) 500 MG tablet Take 1,000 mg by mouth every 6 (six) hours as needed. For pain    . albuterol (PROAIR HFA) 108 (90 BASE) MCG/ACT inhaler Inhale 2 puffs into the lungs every 6 (six) hours as needed. 3 Inhaler 3  . amLODipine (NORVASC) 10 MG tablet Take 1 tablet by mouth daily.    . budesonide-formoterol (SYMBICORT) 80-4.5 MCG/ACT inhaler Inhale 2 puffs into the lungs 2 (two) times daily. 1 Inhaler 12  . cetirizine (ZYRTEC) 10 MG tablet Take 10 mg by mouth daily.     . Cholecalciferol (VITAMIN D3) 5000 units TABS 5,000 IU OTC vitamin D3 daily. 90 tablet 3  . diazepam (VALIUM) 5 MG tablet 1/2 tab q 12 hrs PRN SEVERE panic anxiety 30 tablet 0  . Docusate Sodium (STOOL SOFTENER) 100 MG  capsule Take 100 mg by mouth daily.      . DULoxetine (CYMBALTA) 30 MG capsule Take 1 capsule (30 mg total) by mouth daily. 90 capsule 0  . escitalopram (LEXAPRO) 10 MG tablet Take 1 tablet (10 mg total) by mouth daily. 90 tablet 1  . fluticasone (FLONASE) 50 MCG/ACT nasal spray USE 2 SPRAYS INTO EACH NOSTRIL EVERY DAY 48 g 3  . losartan (COZAAR) 100 MG tablet Take 1 tablet (100 mg total) by mouth daily. 90 tablet 3  . meloxicam (MOBIC) 15 MG tablet Take 1 tablet (15 mg total) by mouth daily. 90 tablet 0  . montelukast (SINGULAIR) 10 MG tablet TAKE 1 TABLET BY MOUTH EACH NIGHT AT BEDTIME 90 tablet 0  . omeprazole (PRILOSEC) 20 MG capsule TAKE 1 CAPSULE BY MOUTH DAILY 90 capsule 0  . traMADol (ULTRAM) 50 MG tablet Take 50 mg by mouth every 6 (six) hours as needed. Reported on 05/23/2015     No current facility-administered medications for this visit.     Allergies  Allergen Reactions  . Morphine Sulfate Other (See Comments)    Makes her hyper    Social History   Social History  . Marital status: Married    Spouse name: N/A  . Number of children: N/A  . Years of education: N/A   Occupational History  . Not on file.   Social History Main Topics  .  Smoking status: Former Smoker    Packs/day: 0.30    Years: 10.00    Types: Cigarettes    Quit date: 03/25/1968  . 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 Alcohol use No  . Drug use: No  . Sexual activity: Not Currently   Other Topics Concern  . Not on file   Social History Narrative   Lives with husband in a 3 story home.  Has 2 children.  Retired Glass blower/designer.  Education: Scientist, water quality.     Review of Systems: General: negative for chills, fever, night sweats or weight changes.  Cardiovascular: negative for chest pain, dyspnea on exertion, edema, orthopnea, palpitations, paroxysmal nocturnal dyspnea or shortness of breath Dermatological: negative for rash Respiratory:  negative for cough or wheezing Urologic: negative for hematuria Abdominal: negative for nausea, vomiting, diarrhea, bright red blood per rectum, melena, or hematemesis Neurologic: negative for visual changes, syncope, or dizziness All other systems reviewed and are otherwise negative except as noted above.    Blood pressure 128/62, pulse 84, height 5\' 4"  (1.626 m), weight 141 lb 3.2 oz (64 kg).  General appearance: alert and no distress Neck: no adenopathy, no carotid bruit, no JVD, supple, symmetrical, trachea midline and thyroid not enlarged, symmetric, no tenderness/mass/nodules Lungs: clear to auscultation bilaterally Heart: regular rate and rhythm, S1, S2 normal, no murmur, click, rub or gallop Extremities: extremities normal, atraumatic, no cyanosis or edema  EKG sinus rhythm 84 with left bundle branch block. I personally reviewed this EKG  ASSESSMENT AND PLAN:   Hyperlipidemia History of hyperlipidemia not on statin therapy because of statin intolerance with recent lipid profile performed 01/11/16 revealed total cholesterol 336, LDL 241 and HDL of 61.  Essential hypertension History of hypertension blood pressure measures 128/62. She is on amlodipine 10 mg a day and losartan. She complains of some ankle edema which is new. We will decrease her amlodipine from 10-5 mg a day. She will follow-up with [name] in one month if she continues to have ankle swelling at which point we will consider adding a low-dose diuretic.      Sonya Harp MD FACP,FACC,FAHA, [facility] 05/28/2016 9:55 AM

## 2016-05-28 NOTE — Assessment & Plan Note (Signed)
History of hypertension blood pressure measures 128/62. She is on amlodipine 10 mg a day and losartan. She complains of some ankle edema which is new. We will decrease her amlodipine from 10-5 mg a day. She will follow-up with [name] in one month if she continues to have ankle swelling at which point we will consider adding a low-dose diuretic.

## 2016-05-28 NOTE — Patient Instructions (Signed)
Medication Instructions: Decrease Amlodipine to 5 mg (1/2 tab) daily.  Follow-Up: Your physician recommends that you schedule a follow-up appointment in: 1 month with PharmD if swelling does not go down  Your physician wants you to follow-up in: 1 year with Dr. [name]. You will receive a reminder letter in the mail two months in advance. If you don't receive a letter, please call our office to schedule the follow-up appointment.  If you need a refill on your cardiac medications before your next appointment, please call your pharmacy.

## 2016-05-28 NOTE — Assessment & Plan Note (Signed)
History of hyperlipidemia not on statin therapy because of statin intolerance with recent lipid profile performed 01/11/16 revealed total cholesterol 336, LDL 241 and HDL of 61.

## 2016-05-31 DIAGNOSIS — M545 Low back pain: Secondary | ICD-10-CM | POA: Diagnosis not present

## 2016-05-31 DIAGNOSIS — G894 Chronic pain syndrome: Secondary | ICD-10-CM | POA: Diagnosis not present

## 2016-06-05 ENCOUNTER — Telehealth: Payer: Self-pay | Admitting: Family Medicine

## 2016-06-05 MED ORDER — DULOXETINE HCL 30 MG PO CPEP: 30.0000 mg | ORAL_CAPSULE | Freq: Every day | ORAL | 0 refills | Status: DC

## 2016-06-05 NOTE — Telephone Encounter (Signed)
Patient is requesting a refill of the Cymbalta 30mg 

## 2016-06-05 NOTE — Addendum Note (Signed)
Addended by: Fonnie Mu on: 06/05/2016 11:27 AM   Modules accepted: Orders

## 2016-06-06 DIAGNOSIS — M545 Low back pain: Secondary | ICD-10-CM | POA: Diagnosis not present

## 2016-06-06 DIAGNOSIS — G894 Chronic pain syndrome: Secondary | ICD-10-CM | POA: Diagnosis not present

## 2016-06-14 ENCOUNTER — Ambulatory Visit (INDEPENDENT_AMBULATORY_CARE_PROVIDER_SITE_OTHER): Payer: Medicare Other

## 2016-06-14 VITALS — BP 135/75 | HR 83 | Wt 142.0 lb

## 2016-06-14 DIAGNOSIS — D510 Vitamin B12 deficiency anemia due to intrinsic factor deficiency: Secondary | ICD-10-CM

## 2016-06-14 DIAGNOSIS — R0789 Other chest pain: Secondary | ICD-10-CM | POA: Diagnosis not present

## 2016-06-14 MED ORDER — CYANOCOBALAMIN 1000 MCG/ML IJ SOLN
1000.0000 ug | Freq: Once | INTRAMUSCULAR | Status: AC
  Administered 2016-06-14: 1000 ug via INTRAMUSCULAR

## 2016-06-14 NOTE — Progress Notes (Signed)
Pt is here for a vitamin B12 injection.  Pt denies gastrointestinal problems or dizziness.  Patient tolerated injection well without complications.  Patient advised to schedule next injection in 30 days.  T. Quentyn Kolbeck, CMA  

## 2016-06-24 ENCOUNTER — Ambulatory Visit (INDEPENDENT_AMBULATORY_CARE_PROVIDER_SITE_OTHER): Payer: Medicare Other | Admitting: Gastroenterology

## 2016-06-24 ENCOUNTER — Encounter: Payer: Self-pay | Admitting: Gastroenterology

## 2016-06-24 VITALS — BP 126/80 | HR 78 | Resp 16 | Ht 64.0 in | Wt 143.0 lb

## 2016-06-24 DIAGNOSIS — R143 Flatulence: Secondary | ICD-10-CM | POA: Diagnosis not present

## 2016-06-24 DIAGNOSIS — M545 Low back pain: Secondary | ICD-10-CM | POA: Diagnosis not present

## 2016-06-24 DIAGNOSIS — R194 Change in bowel habit: Secondary | ICD-10-CM | POA: Diagnosis not present

## 2016-06-24 DIAGNOSIS — G894 Chronic pain syndrome: Secondary | ICD-10-CM | POA: Diagnosis not present

## 2016-06-24 NOTE — Patient Instructions (Signed)
Start over the counter Gas-x three times a day.  Decrease Miralax 1/2- 1 capful daily to titrate depending on bowel movements.   Call our office back in one month if you have no improvement in symptoms.   Thank you for choosing me and [facility].  Pricilla Riffle. Dagoberto Ligas., MD., [facility]

## 2016-06-24 NOTE — Progress Notes (Signed)
History of Present Illness: This is an 83 year old female referred by [name], DO for the evaluation of a change in bowel habits. Patient relates for the past year she has noted looser stools and increased gas. She also has had occasional episodes of incontinence. After her dose of amlodipine was decreased her bowel habits significantly improved. She takes MiraLAX on a daily basis and her stools remain loose and not well formed. Recent stool Hemoccults were negative. Denies weight loss, abdominal pain, constipation, change in stool caliber, melena, hematochezia, nausea, vomiting, dysphagia, reflux symptoms, chest pain.  Colonoscopy 2011 ENDOSCOPIC IMPRESSION:     1) Normal colon     2) Internal and external hemorrhoids  Allergies  Allergen Reactions  . Morphine Sulfate Other (See Comments)    Makes her hyper   Outpatient Medications Prior to Visit  Medication Sig Dispense Refill  . acetaminophen (TYLENOL) 500 MG tablet Take 1,000 mg by mouth every 6 (six) hours as needed. For pain    . albuterol (PROAIR HFA) 108 (90 BASE) MCG/ACT inhaler Inhale 2 puffs into the lungs every 6 (six) hours as needed. 3 Inhaler 3  . amLODipine (NORVASC) 10 MG tablet Take 0.5 tablets (5 mg total) by mouth daily. 45 tablet 3  . budesonide-formoterol (SYMBICORT) 80-4.5 MCG/ACT inhaler Inhale 2 puffs into the lungs 2 (two) times daily. 1 Inhaler 12  . cetirizine (ZYRTEC) 10 MG tablet Take 10 mg by mouth daily.     . Cholecalciferol (VITAMIN D3) 5000 units TABS 5,000 IU OTC vitamin D3 daily. 90 tablet 3  . diazepam (VALIUM) 5 MG tablet 1/2 tab q 12 hrs PRN SEVERE panic anxiety 30 tablet 0  . Docusate Sodium (STOOL SOFTENER) 100 MG capsule Take 100 mg by mouth daily.      . DULoxetine (CYMBALTA) 30 MG capsule Take 1 capsule (30 mg total) by mouth daily. 90 capsule 0  . escitalopram (LEXAPRO) 10 MG tablet Take 1 tablet (10 mg total) by mouth daily. 90 tablet 1  . fluticasone (FLONASE) 50 MCG/ACT nasal  spray USE 2 SPRAYS INTO EACH NOSTRIL EVERY DAY 48 g 3  . losartan (COZAAR) 100 MG tablet Take 1 tablet (100 mg total) by mouth daily. 90 tablet 3  . meloxicam (MOBIC) 15 MG tablet Take 1 tablet (15 mg total) by mouth daily. 90 tablet 0  . montelukast (SINGULAIR) 10 MG tablet TAKE 1 TABLET BY MOUTH EACH NIGHT AT BEDTIME 90 tablet 0  . omeprazole (PRILOSEC) 20 MG capsule TAKE 1 CAPSULE BY MOUTH DAILY 90 capsule 0  . traMADol (ULTRAM) 50 MG tablet Take 50 mg by mouth every 6 (six) hours as needed. Reported on 05/23/2015     No facility-administered medications prior to visit.    Past Medical History:  Diagnosis Date  . ALLERGIC RHINITIS   . ANEMIA, IRON DEFICIENCY   . ANXIETY   . ARTHRITIS    s/p bilateral THR  . ASTHMA, EXTRINSIC   . Bronchitis   . Cataracts, both eyes   . Chronic constipation   . DIABETES MELLITUS, TYPE II   . FIBROMYALGIA    fibromyalgia  . G E R D   . High cholesterol   . HYPERLIPIDEMIA   . HYPERTENSION   . Interstitial cystitis   . MIGRAINE HEADACHE    Past Surgical History:  Procedure Laterality Date  . ABDOMINAL HYSTERECTOMY    . CATARACT EXTRACTION    . COLONOSCOPY    . DOPPLER ECHOCARDIOGRAPHY    .  ESOPHAGOGASTRODUODENOSCOPY ENDOSCOPY    . LUMBAR DISC SURGERY    . NM MYOVIEW LTD     stress test  . Ovarian cyst removed    . TOTAL HIP ARTHROPLASTY     Social History   Social History  . Marital status: Married    Spouse name: N/A  . Number of children: N/A  . Years of education: N/A   Social History Main Topics  . Smoking status: Former Smoker    Packs/day: 0.30    Years: 10.00    Types: Cigarettes    Quit date: 03/25/1968  . 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 Alcohol use No  . Drug use: No  . Sexual activity: Not Currently   Other Topics Concern  . None   Social History Narrative   Lives with husband in a 3 story home.  Has 2 children.  Retired Glass blower/designer.  Education:  Scientist, water quality.   Family History  Problem Relation Age of Onset  . Arthritis Mother   . Hypertension Mother   . Heart disease Father   . Arthritis Father   . Hypertension Father   . Depression Sister   . Hyperlipidemia Sister   . Hypertension Other   . Hyperlipidemia Other     Review of Systems: Pertinent positive and negative review of systems were noted in the above HPI section. All other review of systems were otherwise negative.  Physical Exam: General: Well developed, well nourished, no acute distress Head: Normocephalic and atraumatic Eyes:  sclerae anicteric, EOMI Ears: Normal auditory acuity Mouth: No deformity or lesions Neck: Supple, no masses or thyromegaly Lungs: Clear throughout to auscultation Heart: Regular rate and rhythm; no murmurs, rubs or bruits Abdomen: Soft, non tender and non distended. No masses, hepatosplenomegaly or hernias noted. Normal Bowel sounds Rectal: deferred Musculoskeletal: Symmetrical with no gross deformities  Skin: No lesions on visible extremities Pulses:  Normal pulses noted Extremities: No clubbing, cyanosis, edema or deformities noted Neurological: Alert oriented x 4, grossly nonfocal Cervical Nodes:  No significant cervical adenopathy Inguinal Nodes: No significant inguinal adenopathy Psychological:  Alert and cooperative. Normal mood and affect  Assessment and Recommendations:  1. Change in bowel habits with looser stools and increased gas. Presumed Amlodipine side effect as symptoms improved since amlodipine dose was decreased. Patient advised to reduce dose of MiraLAX to one half scoop daily or one half scoop every other day. Patient will titrate dose to achieve more formed stools. Gas-X bid regularly. Consider replacing Amlodipine with another medication if symptoms not resolved. If bowel habits are still not back to her usual pattern will proceed with colonoscopy for further evaluation.    cc: [name], DO [address] [place], [place] [id number]

## 2016-06-27 ENCOUNTER — Telehealth: Payer: Self-pay | Admitting: Neurology

## 2016-06-27 NOTE — Telephone Encounter (Signed)
Patient called in to get a follow up appt with Dr. Posey Pronto.  Last seen in June 2017 for memory issues.  She states she has had migraines from age [age], after she was placed on blood pressure medication.  She has now recently had two episodes of dizziness, migraine, and nausea.  The first episode she had to call EMT and her blood pressure was elevated.  She has not seen PCP for this because her PCP is out and only a physician assistant is available at their practice.  She does state that this started after Dr. [name], her cardiologist, lowered her blood pressure medication due to swelling.  I advised Dr. [name] would probably but the place to start but I would get Dr. Serita Grit advise.  Dr. Posey Pronto please advise.

## 2016-06-27 NOTE — Telephone Encounter (Signed)
Please advise 

## 2016-06-27 NOTE — Telephone Encounter (Signed)
Agree, if headaches started after changes in blood pressure medication, they may be stemming from elevated blood pressure.  Recommend that she contact Dr. Gates Rigg office to assess blood pressure.  If her headaches continue, will need to see her in the office to discuss migraine medications.  If she needs anything for nausea, ok to prescribe compazine 5mg  q8h prn, #20, 0 refills.   Barbie Croston K. Posey Pronto, DO

## 2016-06-28 NOTE — Telephone Encounter (Signed)
Left message giving patient instructions and requested for her to call if she needs compazine.

## 2016-07-01 DIAGNOSIS — G894 Chronic pain syndrome: Secondary | ICD-10-CM | POA: Diagnosis not present

## 2016-07-01 DIAGNOSIS — M545 Low back pain: Secondary | ICD-10-CM | POA: Diagnosis not present

## 2016-07-02 ENCOUNTER — Ambulatory Visit (INDEPENDENT_AMBULATORY_CARE_PROVIDER_SITE_OTHER): Payer: Medicare Other | Admitting: Pharmacist

## 2016-07-02 ENCOUNTER — Other Ambulatory Visit: Payer: Self-pay | Admitting: Family Medicine

## 2016-07-02 ENCOUNTER — Ambulatory Visit: Payer: Medicare Other

## 2016-07-02 VITALS — BP 138/52 | HR 77

## 2016-07-02 DIAGNOSIS — I10 Essential (primary) hypertension: Secondary | ICD-10-CM

## 2016-07-02 MED ORDER — AMLODIPINE BESYLATE 2.5 MG PO TABS: 7.5000 mg | ORAL_TABLET | Freq: Every day | ORAL | 1 refills | Status: DC

## 2016-07-02 NOTE — Patient Instructions (Addendum)
Return for a  follow up appointment in 3 weeks  Your blood pressure today is 138/52 pulse 77   Check your blood pressure at home daily (if able) and keep record of the readings.  Take your BP meds as follows: Losartan 100mg  daily **Amiodarone 7.5mg  daily**  Bring all of your meds, your BP cuff and your record of home blood pressures to your next appointment.  Exercise as you're able, try to walk approximately 30 minutes per day.  Keep salt intake to a minimum, especially watch canned and prepared boxed foods.  Eat more fresh fruits and vegetables and fewer canned items.  Avoid eating in fast food restaurants.    HOW TO TAKE YOUR BLOOD PRESSURE: . Rest 5 minutes before taking your blood pressure. .  Don't smoke or drink caffeinated beverages for at least 30 minutes before. . Take your blood pressure before (not after) you eat. . Sit comfortably with your back supported and both feet on the floor (don't cross your legs). . Elevate your arm to heart level on a table or a desk. . Use the proper sized cuff. It should fit smoothly and snugly around your bare upper arm. There should be enough room to slip a fingertip under the cuff. The bottom edge of the cuff should be 1 inch above the crease of the elbow. . Ideally, take 3 measurements at one sitting and record the average.

## 2016-07-02 NOTE — Progress Notes (Signed)
Patient ID: LIVI MCGANN                 DOB: [birth year]                      MRN: [id number]     HPI: [name] is a 83 y.o. female referred by Dr. [name] to HTN clinic.  PMH includes diabetes, hypertension and hyperlipidemia.  Amlodipine dose was decreased from 56m to 57mby Dr BeGwenlyn Foundn 05/28/16 after patient developed lowe extremity edema.  No additional medication added at that time and no other adverse drug reaction reported.  During chart review was noted patient is complaining of increase migraine type headaches after amlodipine dose was decreased.  Patient presents for HTN evaulation and medication management.   Denies swelling, fatigue or dizziness, but complains of slight and persistent migraine type headache.  Patient started taking amlodipine 1021mgain 3 days ago due to headaches.  Current HTN meds:  Amlodipine 5mg68mily (resumed 10mg51mly 3 days ago) Losartan 100mg 56my  Previously tried:  amlodipine 10mg d66m Valsartan 160mg da30m BP goal: 130/80  Family History: no cardiac history that she is aware. One sister has high cholesterol she thinks.   Social History: smoked "recreationally" in 20s and 53s. [id number] 35 [id number]. Never used smokeless tobacco and denies alcohol  Diet: She eats all of her meals at home.. Everything is cooked fresh occasionally using seasonings or salts/butter. She endorses 2-3 diet pepsi bottles a day. She also drinks 1 cup of coffee black each day.   Exercise: She is currently not exercising due to back problems   Home BP readings:  7 readings while taking amlodipine 5mg dail63m 159/85 average with a reading of 175/100 associated to migraine HA 8 readings after resuming amlodipine 10mg; [id number]  Wt Readings from Last 3 Encounters:  06/24/16 143 lb (64.9 kg)  06/14/16 142 lb (64.4 kg)  05/28/16 141 lb 3.2 oz (64 kg)   BP Readings from Last 3 Encounters:  07/02/16 (!) 138/52  06/24/16 126/80  06/14/16 135/75   Pulse  Readings from Last 3 Encounters:  07/02/16 77  06/24/16 78  06/14/16 83    Past Medical History:  Diagnosis Date  . ALLERGIC RHINITIS   . ANEMIA, IRON DEFICIENCY   . ANXIETY   . ARTHRITIS    s/p bilateral THR  . ASTHMA, EXTRINSIC   . Bronchitis   . Cataracts, both eyes   . Chronic constipation   . DIABETES MELLITUS, TYPE II   . FIBROMYALGIA    fibromyalgia  . G E R D   . High cholesterol   . HYPERLIPIDEMIA   . HYPERTENSION   . Interstitial cystitis   . MIGRAINE HEADACHE     Current Outpatient Prescriptions on File Prior to Visit  Medication Sig Dispense Refill  . acetaminophen (TYLENOL) 500 MG tablet Take 1,000 mg by mouth every 6 (six) hours as needed. For pain    . albuterol (PROAIR HFA) 108 (90 BASE) MCG/ACT inhaler Inhale 2 puffs into the lungs every 6 (six) hours as needed. 3 Inhaler 3  . budesonide-formoterol (SYMBICORT) 80-4.5 MCG/ACT inhaler Inhale 2 puffs into the lungs 2 (two) times daily. 1 Inhaler 12  . cetirizine (ZYRTEC) 10 MG tablet Take 10 mg by mouth daily.     . Cholecalciferol (VITAMIN D3) 5000 units TABS 5,000 IU OTC vitamin D3 daily. 90 tablet 3  . Cyanocobalamin 1000 MCG/ML KIT Inject  as directed.    . diazepam (VALIUM) 5 MG tablet 1/2 tab q 12 hrs PRN SEVERE panic anxiety 30 tablet 0  . Docusate Sodium (STOOL SOFTENER) 100 MG capsule Take 100 mg by mouth daily.      . DULoxetine (CYMBALTA) 30 MG capsule Take 1 capsule (30 mg total) by mouth daily. 90 capsule 0  . escitalopram (LEXAPRO) 10 MG tablet Take 1 tablet (10 mg total) by mouth daily. 90 tablet 1  . fluticasone (FLONASE) 50 MCG/ACT nasal spray USE 2 SPRAYS INTO EACH NOSTRIL EVERY DAY 48 g 3  . losartan (COZAAR) 100 MG tablet Take 1 tablet (100 mg total) by mouth daily. 90 tablet 3  . meloxicam (MOBIC) 15 MG tablet Take 1 tablet (15 mg total) by mouth daily. 90 tablet 0  . montelukast (SINGULAIR) 10 MG tablet TAKE 1 TABLET BY MOUTH EACH NIGHT AT BEDTIME 90 tablet 0  . omeprazole (PRILOSEC) 20  MG capsule TAKE 1 CAPSULE BY MOUTH DAILY 90 capsule 0  . traMADol (ULTRAM) 50 MG tablet Take 50 mg by mouth every 6 (six) hours as needed. Reported on 05/23/2015     No current facility-administered medications on file prior to visit.     Allergies  Allergen Reactions  . Morphine Sulfate Other (See Comments)    Makes her hyper    Blood pressure (!) 138/52, pulse 77, SpO2 98 %.  Essential hypertension:  Systolic BP remains slightly elevated with borderline low diastolic BP today in clinic.  Blood pressure elevated at home as well with average reading of 159/85. Elevated BP is causing migraine type headaches to the patient but amlodipine 78m was causing lower extremities edema. Plan to initiate low dose diuretic but the patient is very reluctant to use diuretics.  Patient also reports her 162mtablets are very difficult to cut because the tablets crumble and need to waits about 1/2 of the tablet every time.  Will change amlodipine to 7.41m3m2.41mg69m3 tablets) every morning.  Patient will continue  BP close monitoring and follow up in clinic in 3 weeks.  [name] Rodriguez-Guzman PharmD, [id number] 11:54 AM

## 2016-07-04 ENCOUNTER — Ambulatory Visit: Payer: Medicare Other | Admitting: Family Medicine

## 2016-07-04 DIAGNOSIS — M545 Low back pain: Secondary | ICD-10-CM | POA: Diagnosis not present

## 2016-07-04 DIAGNOSIS — G894 Chronic pain syndrome: Secondary | ICD-10-CM | POA: Diagnosis not present

## 2016-07-05 ENCOUNTER — Telehealth: Payer: Self-pay | Admitting: Cardiovascular Disease

## 2016-07-05 DIAGNOSIS — I10 Essential (primary) hypertension: Secondary | ICD-10-CM

## 2016-07-05 MED ORDER — CHLORTHALIDONE 25 MG PO TABS: 12.5000 mg | ORAL_TABLET | Freq: Every day | ORAL | 0 refills | Status: DC

## 2016-07-05 NOTE — Telephone Encounter (Signed)
New message   Pt c/o swelling: STAT is pt has developed SOB within 24 hours  1. How long have you been experiencing swelling? 2 days  2. Where is the swelling located? feet  3.  Are you currently taking a "fluid pill"? No  4.  Are you currently SOB? No   5.  Have you traveled recently? No

## 2016-07-05 NOTE — Addendum Note (Signed)
Addended by: Harrington Challenger on: 07/05/2016 04:38 PM   Modules accepted: Orders

## 2016-07-05 NOTE — Telephone Encounter (Signed)
Talked to Ms Struthers today. Lower extremities edema back after increasing amlodipine to 7.5mg  daily.   Recommendation:  1. Decrease amlodipine back down to 5mg  daily  2. Start chlorthalidone 12.5mg  daily  3. BMET (blood work) in 2weeks  4. Call back on Monday or Tuesday (4/16 - 4/17) to follow up with HTN clinic.

## 2016-07-05 NOTE — Addendum Note (Signed)
Addended by: Harrington Challenger on: 07/05/2016 04:40 PM   Modules accepted: Orders

## 2016-07-05 NOTE — Telephone Encounter (Signed)
Returned call to patient she stated for the past 2 days she has swelling in both feet and ankles.Stated she saw Raquel recently and she said she would prescribe a diuretic if needed.Advised I will send message to Raquel for a order.

## 2016-07-11 DIAGNOSIS — M545 Low back pain: Secondary | ICD-10-CM | POA: Diagnosis not present

## 2016-07-11 DIAGNOSIS — G894 Chronic pain syndrome: Secondary | ICD-10-CM | POA: Diagnosis not present

## 2016-07-16 DIAGNOSIS — M545 Low back pain: Secondary | ICD-10-CM | POA: Diagnosis not present

## 2016-07-16 DIAGNOSIS — G894 Chronic pain syndrome: Secondary | ICD-10-CM | POA: Diagnosis not present

## 2016-07-19 ENCOUNTER — Ambulatory Visit: Payer: Medicare Other

## 2016-07-22 ENCOUNTER — Ambulatory Visit (INDEPENDENT_AMBULATORY_CARE_PROVIDER_SITE_OTHER): Payer: Medicare Other

## 2016-07-22 VITALS — BP 117/74 | HR 73 | Wt 141.9 lb

## 2016-07-22 DIAGNOSIS — E538 Deficiency of other specified B group vitamins: Secondary | ICD-10-CM

## 2016-07-22 MED ORDER — CYANOCOBALAMIN 1000 MCG/ML IJ SOLN
1000.0000 ug | Freq: Once | INTRAMUSCULAR | Status: AC
  Administered 2016-07-22: 1000 ug via INTRAMUSCULAR

## 2016-07-22 NOTE — Progress Notes (Signed)
Pt is here for a vitamin B12 injection.  Pt denies gastrointestinal problems or dizziness.  Patient tolerated injection well without complications.  Patient advised to schedule next injection in 30 days.  T. Kristoffer Bala, CMA  

## 2016-07-24 ENCOUNTER — Other Ambulatory Visit: Payer: Self-pay | Admitting: Family Medicine

## 2016-07-24 ENCOUNTER — Ambulatory Visit (INDEPENDENT_AMBULATORY_CARE_PROVIDER_SITE_OTHER): Payer: Medicare Other | Admitting: Pharmacist

## 2016-07-24 ENCOUNTER — Other Ambulatory Visit: Payer: Self-pay | Admitting: Cardiovascular Disease

## 2016-07-24 VITALS — BP 132/80 | HR 67

## 2016-07-24 DIAGNOSIS — I10 Essential (primary) hypertension: Secondary | ICD-10-CM | POA: Diagnosis not present

## 2016-07-24 DIAGNOSIS — M545 Low back pain: Secondary | ICD-10-CM | POA: Diagnosis not present

## 2016-07-24 DIAGNOSIS — G894 Chronic pain syndrome: Secondary | ICD-10-CM | POA: Diagnosis not present

## 2016-07-24 NOTE — Patient Instructions (Addendum)
Return for a  follow up appointment in 5 weeks  Your blood pressure today is 132/80 pulse 67  Check your blood pressure at home daily (if able) and keep record of the readings.  Take your BP meds as follows: Amlodipine 5mg  daily Chlorthalidone 12.5mg  daily Losartan 100mg  daily  Bring all of your meds, your BP cuff and your record of home blood pressures to your next appointment.  Exercise as you're able, try to walk approximately 30 minutes per day.  Keep salt intake to a minimum, especially watch canned and prepared boxed foods.  Eat more fresh fruits and vegetables and fewer canned items.  Avoid eating in fast food restaurants.    HOW TO TAKE YOUR BLOOD PRESSURE: . Rest 5 minutes before taking your blood pressure. .  Don't smoke or drink caffeinated beverages for at least 30 minutes before. . Take your blood pressure before (not after) you eat. . Sit comfortably with your back supported and both feet on the floor (don't cross your legs). . Elevate your arm to heart level on a table or a desk. . Use the proper sized cuff. It should fit smoothly and snugly around your bare upper arm. There should be enough room to slip a fingertip under the cuff. The bottom edge of the cuff should be 1 inch above the crease of the elbow. . Ideally, take 3 measurements at one sitting and record the average.

## 2016-07-24 NOTE — Telephone Encounter (Signed)
Pt has appt 07/29/16.  Charyl Bigger, CMA

## 2016-07-24 NOTE — Progress Notes (Signed)
Patient ID: [name]                 DOB: [birth year]                      MRN: [id number]     HPI: [name] is a 83 y.o. female referred by [name] to HTN clinic.  PMH includes diabetes, hypertension and hyperlipidemia.  Amlodipine dose was decreased from 72m to 564mby [name].  No additional medication added at that time and no other adverse drug reaction reported.  Chart review showed patient complained of increase migraine type headaches after amlodipine dose was decreased.  During most recent HNT clinic visit patient was reluctant to start new medication; therefore, amlodipine dose was increased to 7.58m38maily to assess tolerability. Few days after she called back to reported low extremity Barr again. Amlodipine dose was decreased back to 58mg64md chlorthalidone 12.5ng was initiated.  BMET order entered on 07/05/16 for patient to complete 1-2 weeks after initiating chlorthalidone therapy.   Patient presents for HTN follow up and medication management.   Denies swelling, fatigue, dizziness or chest pains. Her migraine headaches as resolved as well.  Current HTN meds:  Amlodipine 58mg 76mly Chlorthalidone 12.58mg d7my Losartan 100mg d40m  Previously tried:  amlodipine 7.58mg - 17m dai32malsartan 160mg dail658mP goal: 130/80  Family History: no cardiac history that she is aware. One sister has high cholesterol she thinks.   Social History: smoked "recreationally" in 20s and 3261sQuit 38s35 years o62. Never used smokeless tobacco and denies alcohol  Diet:She eats all of her meals at home.. Everything is cooked fresh occasionally using seasonings or salts/butter. She endorses 2-3 diet pepsi bottles a day. She also drinks 1 cup of coffee black each day.   Exercise:She is currently minimal physical activity due to chronic back problems   Home BP readings: 20 readings; average reading 132/75 (pulse 73-91)  Wt  Readings from Last 3 Encounters:  07/22/16 141 lb 14.4 oz (64.4 kg)  06/24/16 143 lb (64.9 kg)  06/14/16 142 lb (64.4 kg)   BP Readings from Last 3 Encounters:  07/24/16 132/80  07/22/16 117/74  07/02/16 (!) 138/52   Pulse Readings from Last 3 Encounters:  07/24/16 67  07/22/16 73  07/02/16 77    Past Medical History:  Diagnosis Date  . ALLERGIC RHINITIS   . ANEMIA, IRON DEFICIENCY   . ANXIETY   . ARTHRITIS    s/p bilateral THR  . ASTHMA, EXTRINSIC   . Bronchitis   . Cataracts, both eyes   . Chronic constipation   . DIABETES MELLITUS, TYPE II   . FIBROMYALGIA    fibromyalgia  . G E R D   . High cholesterol   . HYPERLIPIDEMIA   . HYPERTENSION   . Interstitial cystitis   . MIGRAINE HEADACHE     Current Outpatient Prescriptions on File Prior to Visit  Medication Sig Dispense Refill  . acetaminophen (TYLENOL) 500 MG tablet Take 1,000 mg by mouth every 6 (six) hours as needed. For pain    . albuterol (PROAIR HFA) 108 (90 BASE) MCG/ACT inhaler Inhale 2 puffs into the lungs every 6 (six) hours as needed. 3 Inhaler 3  . amLODipine (NORVASC) 2.5 MG tablet Take 3 tablets (7.5 mg total) by mouth daily. 90 tablet 1  . budesonide-formoterol (SYMBICORT) 80-4.5 MCG/ACT inhaler Inhale 2 puffs into the  lungs 2 (two) times daily. 1 Inhaler 12  . cetirizine (ZYRTEC) 10 MG tablet Take 10 mg by mouth daily.     . Cholecalciferol (VITAMIN D3) 5000 units TABS 5,000 IU OTC vitamin D3 daily. 90 tablet 3  . Cyanocobalamin 1000 MCG/ML KIT Inject as directed.    . diazepam (VALIUM) 5 MG tablet 1/2 tab q 12 hrs PRN SEVERE panic anxiety 30 tablet 0  . Docusate Sodium (STOOL SOFTENER) 100 MG capsule Take 100 mg by mouth daily.      . DULoxetine (CYMBALTA) 30 MG capsule Take 1 capsule (30 mg total) by mouth daily. 90 capsule 0  . fluticasone (FLONASE) 50 MCG/ACT nasal spray USE 2 SPRAYS INTO EACH NOSTRIL EVERY DAY 48 g 3  . losartan (COZAAR) 100 MG tablet Take 1 tablet (100 mg total) by mouth  daily. 90 tablet 3  . meloxicam (MOBIC) 15 MG tablet TAKE 1 TABLET BY MOUTH DAILY 90 tablet 0  . montelukast (SINGULAIR) 10 MG tablet TAKE 1 TABLET BY MOUTH EACH NIGHT AT BEDTIME 90 tablet 0  . omeprazole (PRILOSEC) 20 MG capsule TAKE 1 CAPSULE BY MOUTH DAILY 90 capsule 0  . traMADol (ULTRAM) 50 MG tablet Take 50 mg by mouth every 6 (six) hours as needed. Reported on 05/23/2015     No current facility-administered medications on file prior to visit.     Allergies  Allergen Reactions  . Morphine Sulfate Other (See Comments)    Makes her hyper    Blood pressure 132/80, pulse 67.  Essential hypertension: Blood pressure is much improved today and close to target goal or 130/80. Also noted better BP control at home with an average reading of 132/75, but noted few reading still up to 811S systolic. Her frequent migraine headaches are resolved. Patient denies any ADRs to current therapy but BMET not done yet.  She will have blood work done at PCP office next Monday. I sent a request to PCP office for BMET results when available.  Will continue current therapy without changes and continue daily BP monitoring.  If renal function and electrolytes remain appropriate; chlorthalidone dose may be increased to 77m daily if needed during follow up in 4 weeks.  [name] PharmD, [facility] [id number]/04/2016 8:57 PM

## 2016-07-24 NOTE — Telephone Encounter (Signed)
REFILL 

## 2016-07-29 ENCOUNTER — Encounter: Payer: Self-pay | Admitting: Family Medicine

## 2016-07-29 ENCOUNTER — Ambulatory Visit (INDEPENDENT_AMBULATORY_CARE_PROVIDER_SITE_OTHER): Payer: Medicare Other | Admitting: Family Medicine

## 2016-07-29 VITALS — BP 132/68 | HR 84 | Ht 64.0 in | Wt 141.4 lb

## 2016-07-29 DIAGNOSIS — I10 Essential (primary) hypertension: Secondary | ICD-10-CM | POA: Diagnosis not present

## 2016-07-29 DIAGNOSIS — R7303 Prediabetes: Secondary | ICD-10-CM

## 2016-07-29 DIAGNOSIS — E782 Mixed hyperlipidemia: Secondary | ICD-10-CM | POA: Diagnosis not present

## 2016-07-29 DIAGNOSIS — E538 Deficiency of other specified B group vitamins: Secondary | ICD-10-CM | POA: Diagnosis not present

## 2016-07-29 DIAGNOSIS — Z5320 Procedure and treatment not carried out because of patient's decision for unspecified reasons: Secondary | ICD-10-CM | POA: Diagnosis not present

## 2016-07-29 DIAGNOSIS — F4323 Adjustment disorder with mixed anxiety and depressed mood: Secondary | ICD-10-CM | POA: Diagnosis not present

## 2016-07-29 DIAGNOSIS — E559 Vitamin D deficiency, unspecified: Secondary | ICD-10-CM | POA: Diagnosis not present

## 2016-07-29 DIAGNOSIS — D509 Iron deficiency anemia, unspecified: Secondary | ICD-10-CM | POA: Diagnosis not present

## 2016-07-29 DIAGNOSIS — R5382 Chronic fatigue, unspecified: Secondary | ICD-10-CM

## 2016-07-29 DIAGNOSIS — F411 Generalized anxiety disorder: Secondary | ICD-10-CM | POA: Diagnosis not present

## 2016-07-29 LAB — POCT GLYCOSYLATED HEMOGLOBIN (HGB A1C): HEMOGLOBIN A1C: 5.8

## 2016-07-29 MED ORDER — ESCITALOPRAM OXALATE 20 MG PO TABS: 20.0000 mg | ORAL_TABLET | Freq: Every day | ORAL | 1 refills | Status: DC

## 2016-07-29 NOTE — Assessment & Plan Note (Signed)
Inc lexapro script to 20mg  a day as pt taking this since being seen by Pharm-D at HTN clinic.   - CBT rec  - exercise  - prudent diet

## 2016-07-29 NOTE — Assessment & Plan Note (Signed)
Well controlled- improved from last time  A1c 5.8 today  Diet and exercise d/c pt  Reck 6-16mo

## 2016-07-29 NOTE — Patient Instructions (Signed)
Generalized Anxiety Disorder, Adult Generalized anxiety disorder (GAD) is a mental health disorder. People with this condition constantly worry about everyday events. Unlike normal anxiety, worry related to GAD is not triggered by a specific event. These worries also do not fade or get better with time. GAD interferes with life functions, including relationships, work, and school. GAD can vary from mild to severe. People with severe GAD can have intense waves of anxiety with physical symptoms (panic attacks). What are the causes? The exact cause of GAD is not known. What increases the risk? This condition is more likely to develop in:  Women.  People who have a family history of anxiety disorders.  People who are very shy.  People who experience very stressful life events, such as the death of a loved one.  People who have a very stressful family environment. What are the signs or symptoms? People with GAD often worry excessively about many things in their lives, such as their health and family. They may also be overly concerned about:  Doing well at work.  Being on time.  Natural disasters.  Friendships. Physical symptoms of GAD include:  Fatigue.  Muscle tension or having muscle twitches.  Trembling or feeling shaky.  Being easily startled.  Feeling like your heart is pounding or racing.  Feeling out of breath or like you cannot take a deep breath.  Having trouble falling asleep or staying asleep.  Sweating.  Nausea, diarrhea, or irritable bowel syndrome (IBS).  Headaches.  Trouble concentrating or remembering facts.  Restlessness.  Irritability. How is this diagnosed? Your health care provider can diagnose GAD based on your symptoms and medical history. You will also have a physical exam. The health care provider will ask specific questions about your symptoms, including how severe they are, when they started, and if they come and go. Your health care  provider may ask you about your use of alcohol or drugs, including prescription medicines. Your health care provider may refer you to a mental health specialist for further evaluation. Your health care provider will do a thorough examination and may perform additional tests to rule out other possible causes of your symptoms. To be diagnosed with GAD, a person must have anxiety that:  Is out of his or her control.  Affects several different aspects of his or her life, such as work and relationships.  Causes distress that makes him or her unable to take part in normal activities.  Includes at least three physical symptoms of GAD, such as restlessness, fatigue, trouble concentrating, irritability, muscle tension, or sleep problems. Before your health care provider can confirm a diagnosis of GAD, these symptoms must be present more days than they are not, and they must last for six months or longer. How is this treated? The following therapies are usually used to treat GAD:  Medicine. Antidepressant medicine is usually prescribed for long-term daily control. Antianxiety medicines may be added in severe cases, especially when panic attacks occur.  Talk therapy (psychotherapy). Certain types of talk therapy can be helpful in treating GAD by providing support, education, and guidance. Options include:  Cognitive behavioral therapy (CBT). People learn coping skills and techniques to ease their anxiety. They learn to identify unrealistic or negative thoughts and behaviors and to replace them with positive ones.  Acceptance and commitment therapy (ACT). This treatment teaches people how to be mindful as a way to cope with unwanted thoughts and feelings.  Biofeedback. This process trains you to manage your body's  response (physiological response) through breathing techniques and relaxation methods. You will work with a therapist while machines are used to monitor your physical symptoms.  Stress  management techniques. These include yoga, meditation, and exercise. A mental health specialist can help determine which treatment is best for you. Some people see improvement with one type of therapy. However, other people require a combination of therapies. Follow these instructions at home:  Take over-the-counter and prescription medicines only as told by your health care provider.  Try to maintain a normal routine.  Try to anticipate stressful situations and allow extra time to manage them.  Practice any stress management or self-calming techniques as taught by your health care provider.  Do not punish yourself for setbacks or for not making progress.  Try to recognize your accomplishments, even if they are small.  Keep all follow-up visits as told by your health care provider. This is important. Contact a health care provider if:  Your symptoms do not get better.  Your symptoms get worse.  You have signs of depression, such as:  A persistently sad, cranky, or irritable mood.  Loss of enjoyment in activities that used to bring you joy.  Change in weight or eating.  Changes in sleeping habits.  Avoiding friends or family members.  Loss of energy for normal tasks.  Feelings of guilt or worthlessness. Get help right away if:  You have serious thoughts about hurting yourself or others. If you ever feel like you may hurt yourself or others, or have thoughts about taking your own life, get help right away. You can go to your nearest emergency department or call:  Your local emergency services (911 in the [place]  A suicide crisis helpline, such as the [place] at 873-478-5711. This is open 24 hours a day. Summary  Generalized anxiety disorder (GAD) is a mental health disorder that involves worry that is not triggered by a specific event.  People with GAD often worry excessively about many things in their lives, such as their health and  family.  GAD may cause physical symptoms such as restlessness, trouble concentrating, sleep problems, frequent sweating, nausea, diarrhea, headaches, and trembling or muscle twitching.  A mental health specialist can help determine which treatment is best for you. Some people see improvement with one type of therapy. However, other people require a combination of therapies. This information is not intended to replace advice given to you by your health care provider. Make sure you discuss any questions you have with your health care provider. Document Released: 07/06/2012 Document Revised: 01/30/2016 Document Reviewed: 01/30/2016 Elsevier Interactive Patient Education  2017 [place].      What is Chronic Stress Syndrome, Symptoms & Ways to Deal With it   What is Chronic Stress Syndrome?  Chronic Stress Syndrome is something which can now be called as a medical condition due to the amount of stress an individual is going through these days. Chronic Stress Syndrome causes the body and mind to shutdown and the person has no control over himself or herself. Due to the demands of modern day life and the hardship throughout day and night takes its toll over a period of time and the body and brain starts demanding rest and a break. This leads to certain symptoms where your performance level starts to dip at work, you become irritable both at work and at home, you may stop enjoying activities you previously liked, you may become depressed, you may get angry for even  small things. Chronic Stress Syndrome can significantly impact your quality life. Thus it is important understand the symptoms of Chronic Stress Syndrome and react accordingly in order to cope up with it.  It is important to note here that a balanced work-home equation should be drawn to cut down symptoms of Chronic Stress Syndrome. Minor stressors can be overcome by the body's inbuilt stress response but when there is unending stress for  a long period of time then an external help is required to ease the stress.  Chronic Stress Syndrome can physically and psychologically drain you over a period of time. For such cases stress management is the best way to cope up with Chronic Stress Syndrome. If Chronic Stress Syndrome is not treated then it may result in many health hazards like anxiety, muscle pain, insomnia, and high blood pressure along with a compromised immune system leading to frequent infections and missed days from work.    What are the Symptoms of Chronic Stress Syndrome?   The symptoms of Chronic Stress Syndrome are variable and range from generalized symptoms to emotional symptoms along with behavioral and cognitive symptoms. Some of these symptoms have been delineated below:  Generalized Symptoms of Chronic Stress Syndrome are: Anxiety Depression Social isolation Headache Abdominal pain Lack of sleep Back pain Difficulty in concentrating Hypertension Hemorrhoids Varicose veins Panic attacks/ Panic disorder Cardiovascular diseases.   Some of the Emotional Symptoms of Chronic Stress Syndrome are: To become easily agitated, moody and frustrated Feeling overwhelmed which makes you feel like you are losing control. Having difficulty relaxing and have a peaceful mind Having low self esteem Feeling lonely Feeling worthless Feeling depressed Avoiding social environment.   Some of the Physical Symptoms of Chronic Stress Syndrome are: Headaches Lethargy Alternating diarrhea and constipation Nausea Muscles aches and pains Insomnia Rapid heartbeat and chest pain Infections and frequent colds Decreased libido Nervousness and shaking Tinnitus Sweaty palms Dry mouth Clenched jaw.  Some of the Cognitive Symptoms of Chronic Stress Syndrome are: Constant worrying Racing thoughts Disorganization and forgetfulness Inability to focus Poor judgment Abundance of negativity.  Some of the Behavioral  Symptoms of Chronic Stress Syndrome are: Changes in appetite with less desire to eat Avoiding responsibilities Indulgence in alcohol or recreational drug use Increased nail biting and being fidgety Ways to Deal With Chronic Stress Syndrome    Chronic Stress Syndrome is not something which cannot be addressed. A bit of effort from your side in the form of lifestyle modifications, a little bit of exercise, a balanced work life equation can do wonders and help you get rid of Chronic Stress Syndrome.  Get Proper Sleep: It has been proved that Chronic Stress Syndrome causes loss of sleep where an individual may not even be able to sleep for days unending. This may result in the individual feeling lethargic and unable to focus at work the following morning. This may lead to decreased performance at work. Thus, it is important to have a good sleep-wake cycle. For this, try and not drink any caffeinated beverage about four hours prior to going to sleep, as caffeine pumps up the adrenaline and causes you to stay awake resulting ultimately in Chronic Stress Syndrome.  Avoid Alcohol and Drugs: Another way to get rid of Chronic Stress Syndrome is lifestyle modifications. Stay away from alcohol and other recreational drugs. Take Short Frequent Breaks at Work: Try to take frequent breaks from work and do not work continuously. Try and manage your work in such a way that you  even meet your deadline and come home on time for a happy dinner with family. A good time spent with family and kids does wonders in not only dealing with Chronic Stress Syndrome but also preventing it.  Become Physically Active: Another step towards getting rid of Chronic Stress Syndrome is physical activity. If you do not have time to spend at the gym then at least try and go for daily walks for about half an hour a day which not only keeps the stress away but also is good for your overall health. Physical activity leads to production of  endorphins which will make you feel relaxed and feel good.  Healthy Diet Can Help You Deal With Chronic Stress Syndrome: Have a balanced and healthy diet is another step towards a stress free life and keeping Chronic Stress Syndrome at [place]. If time is a constraint then you can try eating three small meals a day. Try and avoid fast foods and take foods which are healthy and rich in proteins, fiber, and carbohydrates to boost your energy system.  Music Can Soothe Your Mind: Light music is one of the best and most effective relaxation techniques that one can try to overcome stress. It has shown to calm down the mind and take you away from all the stressors that you may be having. These days it is also being used as a therapy in some institutes for overcoming stress. It is important here to discuss the importance of a good social support system for patients with Chronic Stress Syndrome, as a good social support framework can do wonders in taking the stress away from the patient and overcoming Chronic Stress Syndrome.  Meditation Can Help You Deal With Chronic Stress Syndrome Effectively: Meditation and yoga has also shown to be quite effective in relaxing the mind and coping up with Chronic Stress Syndrome   In cases where these measures are not helpful, then it is time for you to consult with a skilled psychologist or a psychiatrist for potential therapies or medications to control the stress response.   The psychologist can help you with a variety of steps for coping up with Chronic Stress Syndrome. Relaxation techniques and behavioral therapy are some of the methods employed by psychologists. In some cases, medications can also be given to help relax the patient.  Since Chronic Stress Syndrome is both emotionally and physically draining for the patient and it also adversely affects the family life of the patient hence it is important for the patient to recognize the condition and taking steps to cope up  with it. Escaping measures like alcohol and drug use are of no help as they only aggravate the condition apart from their other health hazards. If this condition is ignored or left untreated it can lead to various medical conditions like anxiety and depression and various other medical conditions.  Last but not least, smile as often as you can as it is the best gift that you can give to someone. The best way to stay relaxed is to have a good smile, exercise daily, spend time with your family, meditation and if required consultation with a good psychologist so that you can live a stress free life and overcome the symptoms of Chronic Stress Syndrome.

## 2016-07-29 NOTE — Progress Notes (Signed)
Impression and Recommendations:    1. Prediabetes- 6.0 12/17   2. GAD (generalized anxiety disorder)   3. Essential hypertension   4. Medication refused- (any cholesterol ones)   5. Adjustment disorder with mixed anxiety and depressed mood   6. Mixed hyperlipidemia   7. B12 deficiency   8. Anxiety state   9. Vitamin D deficiency   10. Iron deficiency anemia, unspecified iron deficiency anemia type   11. Chronic fatigue      Adjustment disorder with mixed anxiety and depressed mood Inc lexapro script to 20mg  a day as pt taking this since being seen by Pharm-D at HTN clinic.   - CBT rec  - exercise  - prudent diet  Essential hypertension - stable today  - cont meds  - cont salt restriction and exercise QD  Med Mgt by HTN clinic Pharm-D (Rodriguez-Guzman, Raquel, RPH)  and Dr [name].      B12 deficiency On Bi-monthly injections as directed by Neuro- Dr Posey Pronto- to help w FM sx and energy levels.    - will check levels next OV during PE  Hyperlipidemia FLP done on 10/17- elevated Tc: 336, LDL 241, HDL 61.   - Will check at upcoming CPE in Sept  - pt refuses to go on any chol meds due to debilitating myalgias / flare-ups of her FM  Medication refused- (any cholesterol ones) Diet and lifestyle mod d/c pt as first line mgt of HLD  Prediabetes- 6.0 12/17 Well controlled- improved from last time  A1c 5.8 today  Diet and exercise d/c pt  Reck 6-15mo  Anxiety state Relaxation techniques r/w pt.    handouts given  Med inc today    Orders Placed This Encounter  Procedures  . CBC with Differential/Platelet    Standing Status:   Future    Standing Expiration Date:   07/29/2017  . Comprehensive metabolic panel    Standing Status:   Future    Standing Expiration Date:   07/29/2017    Order Specific Question:   Has the patient fasted?    Answer:   Yes  . Lipid panel    Standing Status:   Future    Standing Expiration Date:   07/29/2017    Order  Specific Question:   Has the patient fasted?    Answer:   Yes  . T4, free    Standing Status:   Future    Standing Expiration Date:   07/29/2017  . TSH    Standing Status:   Future    Standing Expiration Date:   07/29/2017  . Vitamin B12    Standing Status:   Future    Standing Expiration Date:   07/29/2017  . VITAMIN D 25 Hydroxy (Vit-D Deficiency, Fractures)    Standing Status:   Future    Standing Expiration Date:   07/29/2017  . Hemoglobin A1c    Standing Status:   Future    Standing Expiration Date:   07/29/2017  . POCT glycosylated hemoglobin (Hb A1C)    New Prescriptions   No medications on file    Modified Medications   Modified Medication Previous Medication   ESCITALOPRAM (LEXAPRO) 20 MG TABLET escitalopram (LEXAPRO) 10 MG tablet      Take 1 tablet (20 mg total) by mouth daily.    TAKE 1 TABLET BY MOUTH DAILY      The patient was counseled, risk factors were discussed, anticipatory guidance given.  Gross side effects, risk and  benefits, and alternatives of medications and treatment plan in general discussed with patient.  Patient is aware that all medications have potential side effects and we are unable to predict every side effect or drug-drug interaction that may occur.   Patient will call with any questions prior to using medication if they have concerns.  Expresses verbal understanding and consents to current therapy and treatment regimen.  No barriers to understanding were identified.  Red flag symptoms and signs discussed in detail.  Patient expressed understanding regarding what to do in case of emergency\urgent symptoms  Return for keep appt in Sept. .  Please see AVS handed out to patient at the end of our visit for further patient instructions/ counseling done pertaining to today's office visit.    Note: This document was prepared using Dragon voice recognition software and may include unintentional dictation  errors.   --------------------------------------------------------------------------------------------------------------------------------------------------------------------------------------------------------------------------------------------    Subjective:    CC:  Chief Complaint  Patient presents with  . Hyperglycemia  . Hypertension  . Adjustment Disorder    HPI: [name] is a 83 y.o. female who presents to [place] at [facility] today for issues as discussed below. No complaints   Chronic back pain:   Went to Dr Lianne Cure- Pain mgt [place]  MRI done- r/w pt today and they rec same injections she gets w Dr Nelva Bush.  They sent her to PT though- which has helped a lot!!    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   Pt trying to be more active.   walking dog 67min QD, sometimes twice daily.   Pain overall improved and has f/up with Dr Nelva Bush for repeat injections in near future   Pre-dm:  Doing well.   A1c today 5.8 --> in Dec  Was 6.0.  Improved with more activity/ walking   HTN:   Amlodipine dose was decreased from 10mg  to 5mg  by Dr [name] on 05/28/16 after patient developed lower extremity edema.    Seen and managed by HTN clinic also and most recently seen by Pharmacist on 07/24/16.  OV and med list reviewed. Denies CV Sx at all today- see ROS    Mood:  More anxiety then depressed now.   Not using valium unless panic attack--> not used in 4 wks or so-->  we again discussed risks assoc with that med.     occ gets tight muscles in shoulders/back with stress.      Cardiology provider mentioned she might want to inc dose of lexapro.  Pt did for past 3 days and helps -->  Shoulders feel more relaxed, less gritting of her teeth.    She has not used CBT/ counselor yet which I again recommended.  Daughter who lives close by see's pt daily.     Wt Readings from Last 3 Encounters:  07/29/16  141 lb 6.4 oz (64.1 kg)  07/22/16 141 lb 14.4 oz (64.4 kg)  06/24/16 143 lb (64.9 kg)   BP Readings from Last 3 Encounters:  07/29/16 132/68  07/24/16 132/80  07/22/16 117/74   Pulse Readings from Last 3 Encounters:  07/29/16 84  07/24/16 67  07/22/16 73   BMI Readings from Last 3 Encounters:  07/29/16 24.27 kg/m  07/22/16 24.36 kg/m  06/24/16 24.55 kg/m     Patient Care Team    Relationship Specialty Notifications Start End  [name], DO PCP -  General Family Medicine  08/30/15   Ladene Artist, MD Referring Physician Gastroenterology  07/02/10   Myrlene Broker, MD Referring Physician Urology  07/02/10   Clent Jacks, MD Referring Physician Ophthalmology  07/02/10   Nobie Putnam, MD Referring Physician Hematology and Oncology  07/02/10   Latanya Maudlin, MD Referring Physician Orthopedic Surgery  07/02/10   Theodis Sato, MD (Inactive) Referring Physician Orthopedic Surgery  07/02/10   [name], MD Referring Physician Cardiology  07/02/10   Danella Sensing, MD Consulting Physician Dermatology  11/09/15   Juanito Doom, MD Consulting Physician Pulmonary Disease  11/09/15   Suella Broad, MD Consulting Physician Physical Medicine and Rehabilitation  11/09/15    Comment: pain mgt :  injections and pain meds  Rockne Menghini, RPH-CPP Pharmacist Cardiology  11/19/15    Comment:  " [facility]" - med mgt of her HTN per request of Dr Alvester Chou- Cards  Alda Berthold, DO Consulting Physician Neurology  12/17/15   Harrington Challenger, [facility]  Pharmacist  07/29/16    Comment: clinical pharm-D in HTN clinic    Patient Active Problem List   Diagnosis Date Noted  . Medication refused- (any cholesterol ones) 01/20/2016    Priority: High  . Prediabetes- 6.0 12/17 01/11/2016    Priority: High  . Adjustment disorder with mixed anxiety and depressed mood 01/11/2016    Priority: High  . Asthma, mild persistent 12/19/2008    Priority: High  . Hyperlipidemia 08/12/2007     Priority: High  . Essential hypertension 08/12/2007    Priority: High  . Chronic fatigue syndrome with fibromyalgia 01/20/2016    Priority: Medium  . Depression 11/24/2015    Priority: Medium  . B12 deficiency 08/30/2015    Priority: Medium  . Iron deficiency anemia 11/03/2009    Priority: Medium  . Anxiety state 08/12/2007    Priority: Medium  . Muscle pain, fibromyalgia 08/12/2007    Priority: Medium  . Environmental and seasonal allergies 01/20/2016    Priority: Low  . Vitamin D deficiency 01/19/2016    Priority: Low  . Chronic Fatigue- 2ndary to FM and Mood d/o  06/20/2015    Priority: Low  . Memory loss     Priority: Low  . Lumbar radiculopathy, chronic     Priority: Low  . LBBB (left bundle branch block) 11/13/2010    Priority: Low  . G E R D 08/12/2007    Priority: Low  . Type 2 diabetes mellitus without complication, without long-term current use of insulin ([place]) 01/28/2016  . Medication intolerance- statins- (make her FM much W) 01/10/2016  . Reaction, situational, acute, to stress(husband dying) 09/21/2015  . Dysuria 08/18/2015  . Herpes simplex labialis 04/03/2012  . Shingles 04/12/2011  . MIGRAINE HEADACHE 05/30/2010  . Allergic rhinitis 08/12/2007  . ARTHRITIS 08/12/2007    Past Medical history, Surgical history, Family history, Social history, Allergies and Medications have been entered into the medical record, reviewed and changed as needed.   Allergies:  Allergies  Allergen Reactions  . Morphine Sulfate Other (See Comments)    Makes her hyper    Review of Systems  Constitutional: Negative.  Negative for diaphoresis and weight loss.  HENT: Negative for nosebleeds.   Eyes: Negative for blurred vision and double vision.  Respiratory: Negative for shortness of breath and wheezing.   Cardiovascular: Negative for chest pain, palpitations, orthopnea and claudication.  Gastrointestinal: Negative for diarrhea, nausea and vomiting.  Musculoskeletal:  Negative for falls and  myalgias.  Skin: Negative for rash.  Neurological: Negative for dizziness and focal weakness.  Endo/Heme/Allergies: Negative for polydipsia.  Psychiatric/Behavioral: Negative for memory loss.     Objective:   Blood pressure 132/68, pulse 84, height 5\' 4"  (1.626 m), weight 141 lb 6.4 oz (64.1 kg). Body mass index is 24.27 kg/m. General: Well Developed, well nourished, appropriate for stated age.  Neuro: Alert and oriented x3, extra-ocular muscles intact, sensation grossly intact.  HEENT: Normocephalic, atraumatic, neck supple  Skin: Warm and dry, no gross rash. Cardiac: RRR, S1 S2,  no murmurs rubs or gallops.  Respiratory: ECTA B/L, no wh/R/R, Not using accessory muscles, speaking in full sentences-unlabored. Vascular:  No gross lower ext edema, cap RF less 2 sec. Psych: No HI/SI, judgement and insight good, Euthymic mood. Full Affect.

## 2016-07-29 NOTE — Assessment & Plan Note (Addendum)
FLP done on 10/17- elevated Tc: 336, LDL 241, HDL 61.   - Will check at upcoming CPE in Sept  - pt refuses to go on any chol meds due to debilitating myalgias / flare-ups of her FM

## 2016-07-29 NOTE — Assessment & Plan Note (Signed)
Relaxation techniques r/w pt.    handouts given  Med inc today

## 2016-07-29 NOTE — Assessment & Plan Note (Addendum)
Diet and lifestyle mod d/c pt as first line mgt of HLD

## 2016-07-29 NOTE — Assessment & Plan Note (Addendum)
On Bi-monthly injections as directed by Neuro- Dr Posey Pronto- to help w FM sx and energy levels.    - will check levels next OV during PE

## 2016-07-29 NOTE — Assessment & Plan Note (Addendum)
-   stable today  - cont meds  - cont salt restriction and exercise QD  Med Mgt by HTN clinic Pharm-D (Rodriguez-Guzman, Raquel, RPH)  and Dr [name].

## 2016-07-30 DIAGNOSIS — M545 Low back pain: Secondary | ICD-10-CM | POA: Diagnosis not present

## 2016-07-30 DIAGNOSIS — G894 Chronic pain syndrome: Secondary | ICD-10-CM | POA: Diagnosis not present

## 2016-07-30 LAB — BASIC METABOLIC PANEL
BUN/Creatinine Ratio: 20 (ref 12–28)
BUN: 18 mg/dL (ref 8–27)
CALCIUM: 9.6 mg/dL (ref 8.7–10.3)
CHLORIDE: 92 mmol/L — AB (ref 96–106)
CO2: 25 mmol/L (ref 18–29)
Creatinine, Ser: 0.88 mg/dL (ref 0.57–1.00)
GFR, EST AFRICAN AMERICAN: 71 mL/min/{1.73_m2} (ref >59)
GFR, EST NON AFRICAN AMERICAN: 62 mL/min/{1.73_m2} (ref >59)
Glucose: 85 mg/dL (ref 65–99)
Potassium: 5.2 mmol/L (ref 3.5–5.2)
Sodium: 135 mmol/L (ref 134–144)

## 2016-08-08 DIAGNOSIS — G894 Chronic pain syndrome: Secondary | ICD-10-CM | POA: Diagnosis not present

## 2016-08-08 DIAGNOSIS — M545 Low back pain: Secondary | ICD-10-CM | POA: Diagnosis not present

## 2016-08-13 DIAGNOSIS — M25562 Pain in left knee: Secondary | ICD-10-CM | POA: Diagnosis not present

## 2016-08-13 DIAGNOSIS — M25561 Pain in right knee: Secondary | ICD-10-CM | POA: Diagnosis not present

## 2016-08-13 DIAGNOSIS — Z96643 Presence of artificial hip joint, bilateral: Secondary | ICD-10-CM | POA: Diagnosis not present

## 2016-08-14 DIAGNOSIS — M961 Postlaminectomy syndrome, not elsewhere classified: Secondary | ICD-10-CM | POA: Diagnosis not present

## 2016-08-20 ENCOUNTER — Other Ambulatory Visit: Payer: Self-pay

## 2016-08-20 MED ORDER — MONTELUKAST SODIUM 10 MG PO TABS: ORAL_TABLET | ORAL | 0 refills | Status: DC

## 2016-08-22 ENCOUNTER — Ambulatory Visit: Payer: Medicare Other | Admitting: Pulmonary Disease

## 2016-08-26 ENCOUNTER — Other Ambulatory Visit: Payer: Self-pay

## 2016-08-26 ENCOUNTER — Other Ambulatory Visit: Payer: Self-pay | Admitting: Internal Medicine

## 2016-08-26 MED ORDER — OMEPRAZOLE 20 MG PO CPDR: 20.0000 mg | DELAYED_RELEASE_CAPSULE | Freq: Every day | ORAL | 1 refills | Status: DC

## 2016-08-26 MED ORDER — DULOXETINE HCL 30 MG PO CPEP: 30.0000 mg | ORAL_CAPSULE | Freq: Every day | ORAL | 1 refills | Status: DC

## 2016-08-27 NOTE — Telephone Encounter (Signed)
Per me last OV note under HTN - plan:  " Med Mgt by HTN clinic Pharm-D (Rodriguez-Guzman, Raquel, RPH)  and Dr [name]."

## 2016-08-28 ENCOUNTER — Encounter: Payer: Self-pay | Admitting: Pharmacist Clinician (PhC)/ Clinical Pharmacy Specialist

## 2016-08-28 ENCOUNTER — Ambulatory Visit (INDEPENDENT_AMBULATORY_CARE_PROVIDER_SITE_OTHER): Payer: Medicare Other | Admitting: Pharmacist Clinician (PhC)/ Clinical Pharmacy Specialist

## 2016-08-28 DIAGNOSIS — I10 Essential (primary) hypertension: Secondary | ICD-10-CM

## 2016-08-28 NOTE — Assessment & Plan Note (Addendum)
Patient with home BP good but not at newest goals.  Because of her ongoing pain/arthritis issues as well as her age, I would not recommend pushing her home readings lower.  She is to continue with her current medications and home monitoring.  Should her pressure increase noticeably to [phone] systolic she will call for further recommendations

## 2016-08-28 NOTE — Patient Instructions (Signed)
  Your blood pressure today is 134/72    Check your blood pressure at home several times each week and keep record of the readings.  If you notice it trending to 140-150's consistently, please give [name] a call.  Take your BP meds as follows:  Continue with all current medications.  Bring all of your meds, your BP cuff and your record of home blood pressures to your next appointment.  Exercise as you're able, try to walk approximately 30 minutes per day.  Keep salt intake to a minimum, especially watch canned and prepared boxed foods.  Eat more fresh fruits and vegetables and fewer canned items.  Avoid eating in fast food restaurants.    HOW TO TAKE YOUR BLOOD PRESSURE: . Rest 5 minutes before taking your blood pressure. .  Don't smoke or drink caffeinated beverages for at least 30 minutes before. . Take your blood pressure before (not after) you eat. . Sit comfortably with your back supported and both feet on the floor (don't cross your legs). . Elevate your arm to heart level on a table or a desk. . Use the proper sized cuff. It should fit smoothly and snugly around your bare upper arm. There should be enough room to slip a fingertip under the cuff. The bottom edge of the cuff should be 1 inch above the crease of the elbow. . Ideally, take 3 measurements at one sitting and record the average.

## 2016-08-28 NOTE — Progress Notes (Signed)
Patient ID: THETA LEAF                 DOB: 03/29/1933                      MRN: [id number]     HPI: [name] is a 83 y.o. female patient of Dr. [name] here for a hypertension follow up visit.    PMH includes diabetes, hypertension and hyperlipidemia.  She developed edema with 10 mg amlodipine, however is doing well with the 5 mg dose.  Her biggest problem is chronic arthritis in her back, for which she gets occasional injections from Dr. Nelva Bush.  She also takes meloxicam daily, which helps keep the pain bearable.  She also uses the occasional tramadol tablet.    She was started on chlorthalidone 12.5 mg daily in April, in addition to her other meds, and has seen much improvement in her home readings.   She had a BMET done after 2 weeks and her SCr was stable.     Today she denies swelling, fatigue, dizziness or chest pains. Her recent migraine headaches have resolved as well.    Current HTN meds:  Amlodipine 20m daily Chlorthalidone 12.591mdaily Losartan 10070maily  Previously tried:  amlodipine 7.5mg63m10mg42mly - caused LEE Valsartan 160mg 61my  BP goal: 130/80  Family History: no cardiac history that she is aware. One sister has high cholesterol she thinks.   Social History: smoked "recreationally" in 20s an37s0s. Q30s at 37 yea89 old. Never used smokeless tobacco and denies alcohol  Diet:She eats all of her meals at home.. Everything is cooked fresh occasionally using seasonings or salts/butter. She endorses 2-3 diet pepsi bottles a day. She also drinks 1 cup of coffee black each day.   Exercise:She is currently minimal physical activity due to chronic back problems   Home BP readings: 20 readings; average reading 135/72 range 123-153/68-84   Wt Readings from Last 3 Encounters:  07/29/16 141 lb 6.4 oz (64.1 kg)  07/22/16 141 lb 14.4 oz (64.4 kg)  06/24/16 143 lb (64.9 kg)   BP Readings from Last 3 Encounters:  07/29/16 132/68  07/24/16 132/80    07/22/16 117/74   Pulse Readings from Last 3 Encounters:  07/29/16 84  07/24/16 67  07/22/16 73    Past Medical History:  Diagnosis Date  . ALLERGIC RHINITIS   . ANEMIA, IRON DEFICIENCY   . ANXIETY   . ARTHRITIS    s/p bilateral THR  . ASTHMA, EXTRINSIC   . Bronchitis   . Cataracts, both eyes   . Chronic constipation   . DIABETES MELLITUS, TYPE II   . FIBROMYALGIA    fibromyalgia  . G E R D   . High cholesterol   . HYPERLIPIDEMIA   . HYPERTENSION   . Interstitial cystitis   . MIGRAINE HEADACHE     Current Outpatient Prescriptions on File Prior to Visit  Medication Sig Dispense Refill  . acetaminophen (TYLENOL) 500 MG tablet Take 1,000 mg by mouth every 6 (six) hours as needed. For pain    . albuterol (PROAIR HFA) 108 (90 BASE) MCG/ACT inhaler Inhale 2 puffs into the lungs every 6 (six) hours as needed. 3 Inhaler 3  . amLODipine (NORVASC) 2.5 MG tablet Take 5 mg by mouth daily.    . budesonide-formoterol (SYMBICORT) 80-4.5 MCG/ACT inhaler Inhale 2 puffs into the lungs 2 (two) times daily. 1 Inhaler 12  . cetirizine (ZYRTEC) 10  MG tablet Take 10 mg by mouth daily.     . chlorthalidone (HYGROTON) 25 MG tablet TAKE 1/2 TABLET BY MOUTH DAILY 15 tablet 4  . Cholecalciferol (VITAMIN D3) 5000 units TABS 5,000 IU OTC vitamin D3 daily. 90 tablet 3  . Cyanocobalamin 1000 MCG/ML KIT Inject as directed.    . diazepam (VALIUM) 5 MG tablet 1/2 tab q 12 hrs PRN SEVERE panic anxiety 30 tablet 0  . Docusate Sodium (STOOL SOFTENER) 100 MG capsule Take 100 mg by mouth daily.      . DULoxetine (CYMBALTA) 30 MG capsule Take 1 capsule (30 mg total) by mouth daily. 90 capsule 1  . escitalopram (LEXAPRO) 20 MG tablet Take 1 tablet (20 mg total) by mouth daily. 90 tablet 1  . fluticasone (FLONASE) 50 MCG/ACT nasal spray USE 2 SPRAYS INTO EACH NOSTRIL EVERY DAY 48 g 3  . losartan (COZAAR) 100 MG tablet Take 1 tablet (100 mg total) by mouth daily. 90 tablet 3  . meloxicam (MOBIC) 15 MG tablet  TAKE 1 TABLET BY MOUTH DAILY 90 tablet 0  . montelukast (SINGULAIR) 10 MG tablet TAKE 1 TABLET BY MOUTH EACH NIGHT AT BEDTIME 90 tablet 0  . omeprazole (PRILOSEC) 20 MG capsule Take 1 capsule (20 mg total) by mouth daily. 90 capsule 1  . traMADol (ULTRAM) 50 MG tablet Take 50 mg by mouth every 6 (six) hours as needed. Reported on 05/23/2015     No current facility-administered medications on file prior to visit.     Allergies  Allergen Reactions  . Morphine Sulfate Other (See Comments)    Makes her hyper    There were no vitals taken for this visit.  Essential hypertension:  Patient with home BP good but not at newest goals.  Because of her ongoing pain/arthritis issues as well as her age, I would not recommend pushing her home readings lower.  She is to continue with her current medications and home monitoring.  Should her pressure increase noticeably to 430-148 systolic she will call for further recommendations  Tommy Medal PharmD CPP [place] [id number] 08/28/2016 10:05 AM

## 2016-08-29 ENCOUNTER — Other Ambulatory Visit: Payer: Self-pay | Admitting: Internal Medicine

## 2016-08-30 ENCOUNTER — Ambulatory Visit (INDEPENDENT_AMBULATORY_CARE_PROVIDER_SITE_OTHER): Payer: Medicare Other | Admitting: Family Medicine

## 2016-08-30 DIAGNOSIS — E538 Deficiency of other specified B group vitamins: Secondary | ICD-10-CM

## 2016-08-30 DIAGNOSIS — R413 Other amnesia: Secondary | ICD-10-CM | POA: Diagnosis not present

## 2016-08-30 MED ORDER — LOSARTAN POTASSIUM 100 MG PO TABS: 100.0000 mg | ORAL_TABLET | Freq: Every day | ORAL | 1 refills | Status: DC

## 2016-08-30 MED ORDER — CYANOCOBALAMIN 1000 MCG/ML IJ SOLN
1000.0000 ug | Freq: Once | INTRAMUSCULAR | Status: AC
  Administered 2016-08-30: 1000 ug via INTRAMUSCULAR

## 2016-08-30 MED ORDER — CYANOCOBALAMIN 1000 MCG/ML IJ SOLN: 1000.0000 ug | Freq: Once | INTRAMUSCULAR | 0 refills | Status: DC

## 2016-08-30 NOTE — Progress Notes (Signed)
Patient seen for B12 injection.  Tolerated well.  Also refilled losartan.

## 2016-09-01 NOTE — Telephone Encounter (Signed)
This message does not state/ indicate which med pt needs RFed.  Please call pt and see what she wants RF.

## 2016-09-02 NOTE — Telephone Encounter (Signed)
Spoke with patient regarding medications.  She also some how picked up B12 injection.  Patient says she is not able to return the medication.  Advised her to bring with her to her next appointment and we could give it to her as patient supplied.  Advised patient to not pick B12 injection up at pharmacy next time we supply it in office.

## 2016-09-03 ENCOUNTER — Encounter: Payer: Self-pay | Admitting: Pulmonary Disease

## 2016-09-03 ENCOUNTER — Ambulatory Visit (INDEPENDENT_AMBULATORY_CARE_PROVIDER_SITE_OTHER): Payer: Medicare Other | Admitting: Pulmonary Disease

## 2016-09-03 DIAGNOSIS — J4531 Mild persistent asthma with (acute) exacerbation: Secondary | ICD-10-CM

## 2016-09-03 DIAGNOSIS — J3089 Other allergic rhinitis: Secondary | ICD-10-CM

## 2016-09-03 DIAGNOSIS — Z96643 Presence of artificial hip joint, bilateral: Secondary | ICD-10-CM | POA: Diagnosis not present

## 2016-09-03 NOTE — Patient Instructions (Signed)
Go home and check to see if your albuterol inhaler isn't date. This is your rescue inhaler which is intended to be used 2 puffs every 4-6 hours as needed for chest tightness, wheezing, or shortness of breath Stay off of Symbicort If you notice that you're having to use albuterol more than 2 times per week and this means that your asthma is poorly controlled. You need to let me know about this right away Otherwise, we will see you back in 6 months

## 2016-09-03 NOTE — Assessment & Plan Note (Signed)
Continue Singulair.

## 2016-09-03 NOTE — Progress Notes (Signed)
Subjective:    Patient ID: [name], female    DOB: 1934/03/11, 83 y.o.   MRN: [id number]   Synopsis: Former patient of Dr. Joya Gaskins who has moderate persistent asthma. March 2017 eosinophil count 100 cells per microliter, IgE 26 Jul 2015 pulmonary function testing ratio 86%, FEV1 2.10 L (113% predicted), FVC 2.45 (98% predicted), no change in FEV1 with bronchodilator, total lung capacity 4.57 L (93% predicted). DLCO 17.47 (76% predicted).   HPI Chief Complaint  Patient presents with  . Follow-up    Pt states her breathing is well; denies SOB or wheezing. Pt states she does have chest tightness related to anxiety d/t husband passing. Pt states she has not taking Symbicort in 2-3 months as she is feeling fine   Timia lost her husband from Parkinson's disease. He passed a year ago in June. She reports no asthma attacks since the last visit. She reports intermittent chest tightness which she attributes to anxiety from the loss of her husband.  She has not had bronchitis requiring antibiotics or prednisone. She says that she has a lot more anxiety and tenseness lately as she is nearing the anniversary of her husband's death. She doesn't wheeze much. She reports some cough with losartan use.  No mucus production.  No acid reflux or post nasal drip symptoms.  The cough only bothers her a little. She has not taken Symbicort in 3 months.  Past Medical History:  Diagnosis Date  . ALLERGIC RHINITIS   . ANEMIA, IRON DEFICIENCY   . ANXIETY   . ARTHRITIS    s/p bilateral THR  . ASTHMA, EXTRINSIC   . Bronchitis   . Cataracts, both eyes   . Chronic constipation   . DIABETES MELLITUS, TYPE II   . FIBROMYALGIA    fibromyalgia  . G E R D   . High cholesterol   . HYPERLIPIDEMIA   . HYPERTENSION   . Interstitial cystitis   . MIGRAINE HEADACHE       Review of Systems  Constitutional: Negative for chills, diaphoresis, fatigue and unexpected weight change.  HENT: Negative for  rhinorrhea, sinus pressure and sneezing.   Respiratory: Negative for choking, shortness of breath and wheezing.   Cardiovascular: Negative for chest pain, palpitations and leg swelling.       Objective:   Physical Exam Vitals:   09/03/16 1100  BP: 130/78  Pulse: 79  SpO2: 99%  Weight: 138 lb 6.4 oz (62.8 kg)  Height: '5\' 4"'  (1.626 m)   Gen: well appearing HENT: OP clear, TM's clear, neck supple PULM: CTA B, normal percussion CV: RRR, no mgr, trace edema GI: BS+, soft, nontender Derm: no cyanosis or rash Psyche: normal mood and affect   March 2017 eosinophil count 100 cells per microliter, IgE 26 Jul 2015 pulmonary function testing ratio 86%, FEV1 2.10 L (113% predicted), FVC 2.45 (98% predicted), no change in FEV1 with bronchodilator, total lung capacity 4.57 L (93% predicted). DLCO 17.47 (76% predicted).      Assessment & Plan:  Asthma, mild persistent This has been a stable interval for Mrs. Salvato. She has not had an exacerbation in over a year now. We can step down her therapy.  Plan: Go home and check to see if your albuterol inhaler is in date. This is your rescue inhaler which is intended to be used 2 puffs every 4-6 hours as needed for chest tightness, wheezing, or shortness of breath Stay off of Symbicort If you notice that you're having  to use albuterol more than 2 times per week and this means that your asthma is poorly controlled. You need to let me know about this right away Otherwise, we will see you back in 6 months    Current Outpatient Prescriptions:  .  acetaminophen (TYLENOL) 500 MG tablet, Take 1,000 mg by mouth every 6 (six) hours as needed. For pain, Disp: , Rfl:  .  albuterol (PROAIR HFA) 108 (90 BASE) MCG/ACT inhaler, Inhale 2 puffs into the lungs every 6 (six) hours as needed., Disp: 3 Inhaler, Rfl: 3 .  amLODipine (NORVASC) 2.5 MG tablet, Take 5 mg by mouth daily., Disp: , Rfl:  .  cetirizine (ZYRTEC) 10 MG tablet, Take 10 mg by mouth daily. ,  Disp: , Rfl:  .  chlorthalidone (HYGROTON) 25 MG tablet, TAKE 1/2 TABLET BY MOUTH DAILY, Disp: 15 tablet, Rfl: 4 .  Cholecalciferol (VITAMIN D3) 5000 units TABS, 5,000 IU OTC vitamin D3 daily., Disp: 90 tablet, Rfl: 3 .  Cyanocobalamin 1000 MCG/ML KIT, Inject as directed., Disp: , Rfl:  .  diazepam (VALIUM) 5 MG tablet, 1/2 tab q 12 hrs PRN SEVERE panic anxiety, Disp: 30 tablet, Rfl: 0 .  Docusate Sodium (STOOL SOFTENER) 100 MG capsule, Take 100 mg by mouth daily.  , Disp: , Rfl:  .  DULoxetine (CYMBALTA) 30 MG capsule, Take 1 capsule (30 mg total) by mouth daily., Disp: 90 capsule, Rfl: 1 .  escitalopram (LEXAPRO) 20 MG tablet, Take 1 tablet (20 mg total) by mouth daily., Disp: 90 tablet, Rfl: 1 .  fluticasone (FLONASE) 50 MCG/ACT nasal spray, USE 2 SPRAYS INTO EACH NOSTRIL EVERY DAY, Disp: 48 g, Rfl: 3 .  losartan (COZAAR) 100 MG tablet, Take 1 tablet (100 mg total) by mouth daily., Disp: 90 tablet, Rfl: 1 .  meloxicam (MOBIC) 15 MG tablet, TAKE 1 TABLET BY MOUTH DAILY, Disp: 90 tablet, Rfl: 0 .  montelukast (SINGULAIR) 10 MG tablet, TAKE 1 TABLET BY MOUTH EACH NIGHT AT BEDTIME, Disp: 90 tablet, Rfl: 0 .  omeprazole (PRILOSEC) 20 MG capsule, Take 1 capsule (20 mg total) by mouth daily., Disp: 90 capsule, Rfl: 1 .  traMADol (ULTRAM) 50 MG tablet, Take 50 mg by mouth every 6 (six) hours as needed. Reported on 05/23/2015, Disp: , Rfl:  .  budesonide-formoterol (SYMBICORT) 80-4.5 MCG/ACT inhaler, Inhale 2 puffs into the lungs 2 (two) times daily. (Patient not taking: Reported on 09/03/2016), Disp: 1 Inhaler, Rfl: 12

## 2016-09-03 NOTE — Assessment & Plan Note (Signed)
This has been a stable interval for [name]. She has not had an exacerbation in over a year now. We can step down her therapy.  Plan: Go home and check to see if your albuterol inhaler is in date. This is your rescue inhaler which is intended to be used 2 puffs every 4-6 hours as needed for chest tightness, wheezing, or shortness of breath Stay off of Symbicort If you notice that you're having to use albuterol more than 2 times per week and this means that your asthma is poorly controlled. You need to let me know about this right away Otherwise, we will see you back in 6 months

## 2016-09-04 ENCOUNTER — Other Ambulatory Visit: Payer: Self-pay

## 2016-09-04 ENCOUNTER — Other Ambulatory Visit: Payer: Self-pay | Admitting: Family Medicine

## 2016-09-04 ENCOUNTER — Other Ambulatory Visit: Payer: Self-pay | Admitting: Pulmonary Disease

## 2016-09-04 MED ORDER — ALBUTEROL SULFATE HFA 108 (90 BASE) MCG/ACT IN AERS: 2.0000 | INHALATION_SPRAY | Freq: Four times a day (QID) | RESPIRATORY_TRACT | 5 refills | Status: DC | PRN

## 2016-09-05 ENCOUNTER — Other Ambulatory Visit: Payer: Self-pay | Admitting: Pulmonary Disease

## 2016-09-06 ENCOUNTER — Other Ambulatory Visit: Payer: Self-pay

## 2016-09-06 MED ORDER — MONTELUKAST SODIUM 10 MG PO TABS: ORAL_TABLET | ORAL | 1 refills | Status: DC

## 2016-09-07 ENCOUNTER — Emergency Department (HOSPITAL_COMMUNITY): Payer: Medicare Other

## 2016-09-07 ENCOUNTER — Emergency Department (HOSPITAL_COMMUNITY)
Admission: EM | Admit: 2016-09-07 | Discharge: 2016-09-07 | Disposition: A | Discharge status: Home / Self Care | Payer: Medicare Other | Attending: Emergency Medicine | Admitting: Emergency Medicine

## 2016-09-07 ENCOUNTER — Encounter (HOSPITAL_COMMUNITY): Payer: Self-pay | Admitting: Emergency Medicine

## 2016-09-07 DIAGNOSIS — T84020A Dislocation of internal right hip prosthesis, initial encounter: Secondary | ICD-10-CM | POA: Insufficient documentation

## 2016-09-07 DIAGNOSIS — I10 Essential (primary) hypertension: Secondary | ICD-10-CM | POA: Insufficient documentation

## 2016-09-07 DIAGNOSIS — Z87891 Personal history of nicotine dependence: Secondary | ICD-10-CM | POA: Diagnosis not present

## 2016-09-07 DIAGNOSIS — S73004A Unspecified dislocation of right hip, initial encounter: Secondary | ICD-10-CM

## 2016-09-07 DIAGNOSIS — T148XXA Other injury of unspecified body region, initial encounter: Secondary | ICD-10-CM | POA: Diagnosis not present

## 2016-09-07 DIAGNOSIS — W010XXA Fall on same level from slipping, tripping and stumbling without subsequent striking against object, initial encounter: Secondary | ICD-10-CM | POA: Diagnosis not present

## 2016-09-07 DIAGNOSIS — Y939 Activity, unspecified: Secondary | ICD-10-CM | POA: Diagnosis not present

## 2016-09-07 DIAGNOSIS — Y92007 Garden or yard of unspecified non-institutional (private) residence as the place of occurrence of the external cause: Secondary | ICD-10-CM | POA: Diagnosis not present

## 2016-09-07 DIAGNOSIS — E119 Type 2 diabetes mellitus without complications: Secondary | ICD-10-CM | POA: Insufficient documentation

## 2016-09-07 DIAGNOSIS — Z79899 Other long term (current) drug therapy: Secondary | ICD-10-CM | POA: Insufficient documentation

## 2016-09-07 DIAGNOSIS — Y792 Prosthetic and other implants, materials and accessory orthopedic devices associated with adverse incidents: Secondary | ICD-10-CM | POA: Insufficient documentation

## 2016-09-07 DIAGNOSIS — Z96641 Presence of right artificial hip joint: Secondary | ICD-10-CM | POA: Diagnosis not present

## 2016-09-07 DIAGNOSIS — Z96643 Presence of artificial hip joint, bilateral: Secondary | ICD-10-CM | POA: Diagnosis not present

## 2016-09-07 DIAGNOSIS — M25551 Pain in right hip: Secondary | ICD-10-CM | POA: Diagnosis not present

## 2016-09-07 DIAGNOSIS — Z471 Aftercare following joint replacement surgery: Secondary | ICD-10-CM | POA: Diagnosis not present

## 2016-09-07 DIAGNOSIS — Y999 Unspecified external cause status: Secondary | ICD-10-CM | POA: Diagnosis not present

## 2016-09-07 DIAGNOSIS — S79911A Unspecified injury of right hip, initial encounter: Secondary | ICD-10-CM | POA: Diagnosis present

## 2016-09-07 DIAGNOSIS — J45909 Unspecified asthma, uncomplicated: Secondary | ICD-10-CM | POA: Diagnosis not present

## 2016-09-07 DIAGNOSIS — S73034A Other anterior dislocation of right hip, initial encounter: Secondary | ICD-10-CM | POA: Diagnosis not present

## 2016-09-07 DIAGNOSIS — Z96642 Presence of left artificial hip joint: Secondary | ICD-10-CM | POA: Diagnosis not present

## 2016-09-07 LAB — BASIC METABOLIC PANEL
Anion gap: 14 (ref 5–15)
BUN: 26 mg/dL — ABNORMAL HIGH (ref 6–20)
CO2: 25 mmol/L (ref 22–32)
Calcium: 9.3 mg/dL (ref 8.9–10.3)
Chloride: 98 mmol/L — ABNORMAL LOW (ref 101–111)
Creatinine, Ser: 0.93 mg/dL (ref 0.44–1.00)
GFR calc Af Amer: >60 mL/min (ref >60)
GFR calc non Af Amer: 56 mL/min — ABNORMAL LOW (ref >60)
Glucose, Bld: 131 mg/dL — ABNORMAL HIGH (ref 65–99)
Potassium: 3.7 mmol/L (ref 3.5–5.1)
Sodium: 137 mmol/L (ref 135–145)

## 2016-09-07 LAB — CBC WITH DIFFERENTIAL/PLATELET
Basophils Absolute: 0 10*3/uL (ref 0.0–0.1)
Basophils Relative: 0 %
Eosinophils Absolute: 0.1 10*3/uL (ref 0.0–0.7)
Eosinophils Relative: 0 %
HCT: 39.8 % (ref 36.0–46.0)
Hemoglobin: 13.6 g/dL (ref 12.0–15.0)
Lymphocytes Relative: 20 %
Lymphs Abs: 2.5 10*3/uL (ref 0.7–4.0)
MCH: 29.9 pg (ref 26.0–34.0)
MCHC: 34.2 g/dL (ref 30.0–36.0)
MCV: 87.5 fL (ref 78.0–100.0)
Monocytes Absolute: 1.2 10*3/uL — ABNORMAL HIGH (ref 0.1–1.0)
Monocytes Relative: 9 %
Neutro Abs: 9.2 10*3/uL — ABNORMAL HIGH (ref 1.7–7.7)
Neutrophils Relative %: 71 %
Platelets: 245 10*3/uL (ref 150–400)
RBC: 4.55 MIL/uL (ref 3.87–5.11)
RDW: 13.8 % (ref 11.5–15.5)
WBC: 12.9 10*3/uL — ABNORMAL HIGH (ref 4.0–10.5)

## 2016-09-07 MED ORDER — FENTANYL CITRATE (PF) 100 MCG/2ML IJ SOLN
50.0000 ug | Freq: Once | INTRAMUSCULAR | Status: AC
  Administered 2016-09-07: 50 ug via INTRAVENOUS
  Filled 2016-09-07: qty 2

## 2016-09-07 MED ORDER — PROPOFOL 10 MG/ML IV BOLUS
INTRAVENOUS | Status: AC | PRN
  Administered 2016-09-07: 15 mg via INTRAVENOUS
  Administered 2016-09-07: 30 mg via INTRAVENOUS
  Administered 2016-09-07: 20 mg via INTRAVENOUS

## 2016-09-07 MED ORDER — HYDROCODONE-ACETAMINOPHEN 5-325 MG PO TABS: 1.0000 | ORAL_TABLET | ORAL | 0 refills | Status: DC | PRN

## 2016-09-07 MED ORDER — PROPOFOL 10 MG/ML IV BOLUS
0.5000 mg/kg | Freq: Once | INTRAVENOUS | Status: AC
  Administered 2016-09-07: 31.4 mg via INTRAVENOUS
  Filled 2016-09-07: qty 20

## 2016-09-07 MED ORDER — HYDROMORPHONE HCL 1 MG/ML IJ SOLN
1.0000 mg | Freq: Once | INTRAMUSCULAR | Status: AC
  Administered 2016-09-07: 1 mg via INTRAVENOUS
  Filled 2016-09-07: qty 1

## 2016-09-07 NOTE — Discharge Instructions (Signed)
Please read attached information. If you experience any new or worsening signs or symptoms please return to the emergency room for evaluation. Please follow-up with your primary care provider or specialist as discussed. Please use medication prescribed only as directed and discontinue taking if you have any concerning signs or symptoms.   °

## 2016-09-07 NOTE — ED Provider Notes (Signed)
[facility] DEPT Provider Note   CSN: [id number] Arrival date & time: 09/07/16  1607     History   Chief Complaint Chief Complaint  Patient presents with  . Fall  . Hip Injury    HPI [name] is a 83 y.o. female.  HPI   83 year old female presents today with right hip pain.  Patient notes that she has had bilateral hip replacements in the late 90s early 2000.  She notes that over the last several weeks she has had right hip pain.  She notes that she was standing today felt a pop in her right hip and collapsed to the ground.  She was amenable to move the right hip, pain to the posterior aspect of the hip and femoral head. Patient denies any history of the same.  She denies any loss of distal sensation strength, unable to range due to discomfort.  Patient denies any trauma from the fall.  She reports her last oral intake was around 1 PM today approximately 3-1/2 hours prior to my evaluation.  Patient received fentanyl in route via EMS.  Past Medical History:  Diagnosis Date  . ALLERGIC RHINITIS   . ANEMIA, IRON DEFICIENCY   . ANXIETY   . ARTHRITIS    s/p bilateral THR  . ASTHMA, EXTRINSIC   . Bronchitis   . Cataracts, both eyes   . Chronic constipation   . DIABETES MELLITUS, TYPE II   . FIBROMYALGIA    fibromyalgia  . G E R D   . High cholesterol   . HYPERLIPIDEMIA   . HYPERTENSION   . Interstitial cystitis   . MIGRAINE HEADACHE     Patient Active Problem List   Diagnosis Date Noted  . Type 2 diabetes mellitus without complication, without long-term current use of insulin ([place]) 01/28/2016  . Medication refused- (any cholesterol ones) 01/20/2016  . Environmental and seasonal allergies 01/20/2016  . Chronic fatigue syndrome with fibromyalgia 01/20/2016  . Vitamin D deficiency 01/19/2016  . Prediabetes- 6.0 12/17 01/11/2016  . Adjustment disorder with mixed anxiety and depressed mood 01/11/2016  . Medication intolerance- statins- (make her FM much W)  01/10/2016  . Depression 11/24/2015  . Reaction, situational, acute, to stress(husband dying) 09/21/2015  . B12 deficiency 08/30/2015  . Dysuria 08/18/2015  . Chronic Fatigue- 2ndary to FM and Mood d/o  06/20/2015  . Memory loss   . Lumbar radiculopathy, chronic   . Herpes simplex labialis 04/03/2012  . Shingles 04/12/2011  . LBBB (left bundle branch block) 11/13/2010  . MIGRAINE HEADACHE 05/30/2010  . Iron deficiency anemia 11/03/2009  . Asthma, mild persistent 12/19/2008  . Hyperlipidemia 08/12/2007  . Anxiety state 08/12/2007  . Essential hypertension 08/12/2007  . Allergic rhinitis 08/12/2007  . G E R D 08/12/2007  . ARTHRITIS 08/12/2007  . Muscle pain, fibromyalgia 08/12/2007    Past Surgical History:  Procedure Laterality Date  . ABDOMINAL HYSTERECTOMY    . CATARACT EXTRACTION    . COLONOSCOPY    . DOPPLER ECHOCARDIOGRAPHY    . ESOPHAGOGASTRODUODENOSCOPY ENDOSCOPY    . LUMBAR DISC SURGERY    . NM MYOVIEW LTD     stress test  . Ovarian cyst removed    . TOTAL HIP ARTHROPLASTY      OB History    No data available       Home Medications    Prior to Admission medications   Medication Sig Start Date End Date Taking? Authorizing Provider  acetaminophen (TYLENOL) 500 MG tablet  Take 1,000 mg by mouth every 6 (six) hours as needed. For pain   Yes [provider]  albuterol (PROAIR HFA) 108 (90 Base) MCG/ACT inhaler Inhale 2 puffs into the lungs every 6 (six) hours as needed. 09/04/16  Yes Juanito Doom, MD  amLODipine (NORVASC) 2.5 MG tablet Take 5 mg by mouth daily.   Yes [provider]  cetirizine (ZYRTEC) 10 MG tablet Take 10 mg by mouth daily.  03/13/12  Yes Elsie Stain, MD  chlorthalidone (HYGROTON) 25 MG tablet TAKE 1/2 TABLET BY MOUTH DAILY 07/24/16  Yes [name], MD  Cholecalciferol (VITAMIN D3) 5000 units TABS 5,000 IU OTC vitamin D3 daily. 01/19/16  Yes Opalski, Deborah, DO  diazepam (VALIUM) 5 MG tablet 1/2 tab q 12 hrs  PRN SEVERE panic anxiety 03/11/16  Yes Opalski, Neoma Laming, DO  Docusate Sodium (STOOL SOFTENER) 100 MG capsule Take 100 mg by mouth daily.     Yes [provider]  DULoxetine (CYMBALTA) 30 MG capsule Take 1 capsule (30 mg total) by mouth daily. 08/26/16  Yes Opalski, Deborah, DO  escitalopram (LEXAPRO) 20 MG tablet Take 1 tablet (20 mg total) by mouth daily. 07/29/16  Yes Opalski, Neoma Laming, DO  fluticasone (FLONASE) 50 MCG/ACT nasal spray USE 2 SPRAYS INTO EACH NOSTRIL EVERY DAY 06/04/13  Yes Rowe Clack, MD  losartan (COZAAR) 100 MG tablet Take 1 tablet (100 mg total) by mouth daily. 08/30/16  Yes Opalski, Neoma Laming, DO  meloxicam (MOBIC) 15 MG tablet TAKE 1 TABLET BY MOUTH DAILY 07/02/16  Yes Danford, Katy D, NP  montelukast (SINGULAIR) 10 MG tablet TAKE 1 TABLET BY MOUTH EACH NIGHT AT BEDTIME 09/06/16  Yes Juanito Doom, MD  omeprazole (PRILOSEC) 20 MG capsule Take 1 capsule (20 mg total) by mouth daily. 08/26/16  Yes Opalski, Neoma Laming, DO  traMADol (ULTRAM) 50 MG tablet Take 50 mg by mouth every 6 (six) hours as needed. Reported on 05/23/2015   Yes [provider]  HYDROcodone-acetaminophen (NORCO/VICODIN) 5-325 MG tablet Take 1 tablet by mouth every 4 (four) hours as needed. 09/07/16   Okey Regal, PA-C  SYMBICORT 80-4.5 MCG/ACT inhaler INHALE 2 PUFFS BY MOUTH TWICE DAILY 09/05/16   Juanito Doom, MD    Family History Family History  Problem Relation Age of Onset  . Arthritis Mother   . Hypertension Mother   . Heart disease Father   . Arthritis Father   . Hypertension Father   . Depression Sister   . Hyperlipidemia Sister   . Hypertension Other   . Hyperlipidemia Other     Social History Social History  Substance Use Topics  . Smoking status: Former Smoker    Packs/day: 0.30    Years: 10.00    Types: Cigarettes    Quit date: 03/25/1968  . 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 Alcohol use No      Allergies   Morphine sulfate   Review of Systems Review of Systems  All other systems reviewed and are negative.  Physical Exam Updated Vital Signs BP 138/67 (BP Location: Right Arm)   Pulse 86   Temp 98.7 F (37.1 C) (Oral)   Resp 16   SpO2 94%   Physical Exam  Constitutional: She is oriented to person, place, and time. She appears well-developed and well-nourished.  HENT:  Head: Normocephalic and atraumatic.  Eyes: Conjunctivae are normal. Pupils are equal, round, and reactive to light. Right  eye exhibits no discharge. Left eye exhibits no discharge. No scleral icterus.  Neck: Normal range of motion. No JVD present. No tracheal deviation present.  Pulmonary/Chest: Effort normal. No stridor.  Musculoskeletal:  Deformity of right hip - distal sensation intact  Neurological: She is alert and oriented to person, place, and time. Coordination normal.  Psychiatric: She has a normal mood and affect. Her behavior is normal. Judgment and thought content normal.  Nursing note and vitals reviewed.    ED Treatments / Results  Labs (all labs ordered are listed, but only abnormal results are displayed) Labs Reviewed  CBC WITH DIFFERENTIAL/PLATELET - Abnormal; Notable for the following:       Result Value   WBC 12.9 (*)    Neutro Abs 9.2 (*)    Monocytes Absolute 1.2 (*)    All other components within normal limits  BASIC METABOLIC PANEL - Abnormal; Notable for the following:    Chloride 98 (*)    Glucose, Bld 131 (*)    BUN 26 (*)    GFR calc non Af Amer 56 (*)    All other components within normal limits    EKG  EKG Interpretation None       Radiology Dg Hip Port Unilat W Or Wo Pelvis 1 View Right  Result Date: 09/07/2016 CLINICAL DATA:  83 y/o  F; post reduction radiographs. EXAM: DG HIP (WITH OR WITHOUT PELVIS) 1V PORT RIGHT COMPARISON:  09/07/2016 right hip radiographs FINDINGS: Two frontal radiographs of the right hip demonstrate the femoral head component  of right hip prosthesis projecting over acetabular component in satisfactory position. No acute fracture. No periprosthetic lucency identified. IMPRESSION: Interval reduction of right hip prosthesis dislocation. Electronically Signed   By: Kristine Garbe M.D.   On: 09/07/2016 19:53   Dg Hip Unilat  With Pelvis 2-3 Views Right  Result Date: 09/07/2016 CLINICAL DATA:  Fall, right hip pain EXAM: DG HIP (WITH OR WITHOUT PELVIS) 2-3V RIGHT COMPARISON:  None. FINDINGS: Status post bilateral total hip arthroplasty. Superolateral dislocation of the right proximal femoral head prosthesis at the right hip joint. No evidence of left hip dislocation. No appreciable hardware fracture or loosening on these limited views. No acute osseous fracture. No suspicious focal osseous lesions. No pelvic diastasis. Degenerative changes in the lower lumbar spine. IMPRESSION: Superolateral right hip dislocation. Bilateral total hip arthroplasty. No appreciable hardware fracture or loosening or osseous fracture on these limited views. Electronically Signed   By: Ilona Sorrel M.D.   On: 09/07/2016 18:10    Procedures Reduction of dislocation Date/Time: 09/07/2016 7:34 PM Performed by: Penni Bombard, Aubrionna Istre Authorized by: Penni Bombard, Lita Flynn  Consent: Verbal consent obtained. Written consent obtained. Risks and benefits: risks, benefits and alternatives were discussed Consent given by: patient Patient understanding: patient states understanding of the procedure being performed Patient consent: the patient's understanding of the procedure matches consent given Procedure consent: procedure consent matches procedure scheduled Relevant documents: relevant documents present and verified Test results: test results available and properly labeled Imaging studies: imaging studies available Required items: required blood products, implants, devices, and special equipment available Patient identity confirmed: verbally with patient and  arm band Time out: Immediately prior to procedure a "time out" was called to verify the correct patient, procedure, equipment, support staff and site/side marked as required.  Sedation: Patient sedated: yes Patient tolerance: Patient tolerated the procedure well with no immediate complications    (including critical care time)  SPLINT APPLICATION Date/Time: 27:25 PM Authorized by: Elmer Ramp Consent:  Verbal consent obtained. Risks and benefits: risks, benefits and alternatives were discussed Consent given by: patient Splint applied by: orthopedic technician Location details: right leg Splint type: knee immobilizer Supplies used: knee immobilizer  Post-procedure: The splinted body part was neurovascularly unchanged following the procedure. Patient tolerance: Patient tolerated the procedure well with no immediate complications.  Medications Ordered in ED Medications  fentaNYL (SUBLIMAZE) injection 50 mcg (50 mcg Intravenous Given 09/07/16 1657)  HYDROmorphone (DILAUDID) injection 1 mg (1 mg Intravenous Given 09/07/16 1724)  propofol (DIPRIVAN) 10 mg/mL bolus/IV push 31.4 mg (31.4 mg Intravenous Given 09/07/16 1901)  fentaNYL (SUBLIMAZE) injection 50 mcg (50 mcg Intravenous Given 09/07/16 1828)  propofol (DIPRIVAN) 10 mg/mL bolus/IV push (20 mg Intravenous Given 09/07/16 1910)     Initial Impression / Assessment and Plan / ED Course  I have reviewed the triage vital signs and the nursing notes.  Pertinent labs & imaging results that were available during my care of the patient were reviewed by me and considered in my medical decision making (see chart for details).    Final Clinical Impressions(s) / ED Diagnoses   Final diagnoses:  Dislocation of right hip, initial encounter ([place])   Labs: CBC, BMP,   Imaging: DG hip unilateral pelvis   Consults:  Therapeutics:  Discharge Meds:   Assessment/Plan: 83 year old female presents today with hip dislocation.  Patient  has no acute fracture, reduced here in the ED.  Patient tolerated sedation without complication.  She is requesting discharge home.  She will be discharged with outpatient follow-up and return precautions.   New Prescriptions Discharge Medication List as of 09/07/2016  9:07 PM    START taking these medications   Details  HYDROcodone-acetaminophen (NORCO/VICODIN) 5-325 MG tablet Take 1 tablet by mouth every 4 (four) hours as needed., Starting Sat 09/07/2016, Print         Tecumseh Yeagley, Dellis Filbert, PA-C 09/07/16 2237    Okey Regal, PA-C 09/07/16 9024    Isla Pence, MD 09/08/16 [phone]

## 2016-09-07 NOTE — ED Notes (Signed)
Bed: WA14 Expected date:  Expected time:  Means of arrival:  Comments: EMS-fall/hip pain 

## 2016-09-07 NOTE — ED Provider Notes (Signed)
Medical screening examination/treatment/procedure(s) were conducted as a shared visit with non-physician practitioner(s) and myself.  I personally evaluated the patient during the encounter.   EKG Interpretation None      83 year old female with history of bilateral hip replacements who presents with right hip pain. Felt right hip pop as she was stepping up with right foot and fell. No head strike. No blood thinners. Right hip deformity, with inability to move. NV in tact distally. xr visualized and with right hip dislocation. Will sedate and reduce at bedside.   Reduced successfully at beside. Reduction x-rays shows successful interval reduction. NV in tact extremity after reduction. Patient to follow-up with her orthopedic surgeon as outpatient.   Reduction of dislocation Date/Time: 8:54 PM Performed by: Lenn Sink Authorized by: Forde Dandy Consent: Verbal consent obtained. Risks and benefits: risks, benefits and alternatives were discussed Consent given by: patient Required items: required blood products, implants, devices, and special equipment available Time out: Immediately prior to procedure a "time out" was called to verify the correct patient, procedure, equipment, support staff and site/side marked as required.  Patient sedated: propofol  Vitals: Vital signs were monitored during sedation. Patient tolerance: Patient tolerated the procedure well with no immediate complications. Joint: right hip Reduction technique: traction      Procedural sedation Performed by: Forde Dandy Consent: Verbal consent obtained. Risks and benefits: risks, benefits and alternatives were discussed Required items: required blood products, implants, devices, and special equipment available Patient identity confirmed: arm band and provided demographic data Time out: Immediately prior to procedure a "time out" was called to verify the correct patient, procedure, equipment, support staff and  site/side marked as required.  Sedation type: moderate (conscious) sedation NPO time confirmed and considedered  Sedatives: PROPOFOL  Physician Time at Bedside: 35  Vitals: Vital signs were monitored during sedation. Cardiac Monitor, pulse oximeter Patient tolerance: Patient tolerated the procedure well with no immediate complications. Comments: Pt with uneventful recovered. Returned to pre-procedural sedation baseline   '    Liu, Dana Duo, MD 09/07/16 2054

## 2016-09-07 NOTE — ED Triage Notes (Signed)
Per EMS, pt tripped on uneven ground in yard, felt "pop" sensation in hip, fell to ground. 200 mcg Fentanyl. Hip deformed. Hx bilateral hip replacement in 1999-2000. No blood thinners, no head injury.

## 2016-09-09 DIAGNOSIS — S73004A Unspecified dislocation of right hip, initial encounter: Secondary | ICD-10-CM | POA: Diagnosis not present

## 2016-09-09 DIAGNOSIS — Z96641 Presence of right artificial hip joint: Secondary | ICD-10-CM | POA: Diagnosis not present

## 2016-09-09 DIAGNOSIS — Z4789 Encounter for other orthopedic aftercare: Secondary | ICD-10-CM | POA: Diagnosis not present

## 2016-09-10 ENCOUNTER — Emergency Department (HOSPITAL_COMMUNITY): Payer: Medicare Other

## 2016-09-10 ENCOUNTER — Encounter (HOSPITAL_COMMUNITY): Payer: Self-pay | Admitting: Emergency Medicine

## 2016-09-10 ENCOUNTER — Emergency Department (HOSPITAL_COMMUNITY)
Admission: EM | Admit: 2016-09-10 | Discharge: 2016-09-10 | Disposition: A | Discharge status: Home / Self Care | Payer: Medicare Other | Attending: Emergency Medicine | Admitting: Emergency Medicine

## 2016-09-10 DIAGNOSIS — Y792 Prosthetic and other implants, materials and accessory orthopedic devices associated with adverse incidents: Secondary | ICD-10-CM | POA: Insufficient documentation

## 2016-09-10 DIAGNOSIS — S73004A Unspecified dislocation of right hip, initial encounter: Secondary | ICD-10-CM

## 2016-09-10 DIAGNOSIS — X58XXXA Exposure to other specified factors, initial encounter: Secondary | ICD-10-CM | POA: Insufficient documentation

## 2016-09-10 DIAGNOSIS — T84020A Dislocation of internal right hip prosthesis, initial encounter: Secondary | ICD-10-CM | POA: Insufficient documentation

## 2016-09-10 DIAGNOSIS — Y999 Unspecified external cause status: Secondary | ICD-10-CM | POA: Diagnosis not present

## 2016-09-10 DIAGNOSIS — Z96643 Presence of artificial hip joint, bilateral: Secondary | ICD-10-CM | POA: Insufficient documentation

## 2016-09-10 DIAGNOSIS — T84020D Dislocation of internal right hip prosthesis, subsequent encounter: Secondary | ICD-10-CM | POA: Diagnosis not present

## 2016-09-10 DIAGNOSIS — Z79899 Other long term (current) drug therapy: Secondary | ICD-10-CM | POA: Insufficient documentation

## 2016-09-10 DIAGNOSIS — Y929 Unspecified place or not applicable: Secondary | ICD-10-CM | POA: Diagnosis not present

## 2016-09-10 DIAGNOSIS — Z87891 Personal history of nicotine dependence: Secondary | ICD-10-CM | POA: Insufficient documentation

## 2016-09-10 DIAGNOSIS — M25551 Pain in right hip: Secondary | ICD-10-CM | POA: Diagnosis not present

## 2016-09-10 DIAGNOSIS — E119 Type 2 diabetes mellitus without complications: Secondary | ICD-10-CM | POA: Diagnosis not present

## 2016-09-10 DIAGNOSIS — I10 Essential (primary) hypertension: Secondary | ICD-10-CM | POA: Diagnosis not present

## 2016-09-10 DIAGNOSIS — S79911A Unspecified injury of right hip, initial encounter: Secondary | ICD-10-CM | POA: Diagnosis present

## 2016-09-10 DIAGNOSIS — J45909 Unspecified asthma, uncomplicated: Secondary | ICD-10-CM | POA: Diagnosis not present

## 2016-09-10 DIAGNOSIS — Y939 Activity, unspecified: Secondary | ICD-10-CM | POA: Insufficient documentation

## 2016-09-10 DIAGNOSIS — T148XXA Other injury of unspecified body region, initial encounter: Secondary | ICD-10-CM | POA: Diagnosis not present

## 2016-09-10 LAB — CBC
HEMATOCRIT: 36.6 % (ref 36.0–46.0)
Hemoglobin: 12.5 g/dL (ref 12.0–15.0)
MCH: 29.7 pg (ref 26.0–34.0)
MCHC: 34.2 g/dL (ref 30.0–36.0)
MCV: 86.9 fL (ref 78.0–100.0)
Platelets: 214 10*3/uL (ref 150–400)
RBC: 4.21 MIL/uL (ref 3.87–5.11)
RDW: 13.9 % (ref 11.5–15.5)
WBC: 8.3 10*3/uL (ref 4.0–10.5)

## 2016-09-10 LAB — BASIC METABOLIC PANEL
ANION GAP: 10 (ref 5–15)
BUN: 28 mg/dL — AB (ref 6–20)
CHLORIDE: 104 mmol/L (ref 101–111)
CO2: 27 mmol/L (ref 22–32)
Calcium: 9 mg/dL (ref 8.9–10.3)
Creatinine, Ser: 0.94 mg/dL (ref 0.44–1.00)
GFR calc Af Amer: >60 mL/min (ref >60)
GFR calc non Af Amer: 55 mL/min — ABNORMAL LOW (ref >60)
GLUCOSE: 136 mg/dL — AB (ref 65–99)
POTASSIUM: 3.5 mmol/L (ref 3.5–5.1)
Sodium: 141 mmol/L (ref 135–145)

## 2016-09-10 LAB — TYPE AND SCREEN
ABO/RH(D): A POS
Antibody Screen: NEGATIVE

## 2016-09-10 LAB — ABO/RH: ABO/RH(D): A POS

## 2016-09-10 MED ORDER — ONDANSETRON HCL 4 MG/2ML IJ SOLN
4.0000 mg | Freq: Once | INTRAMUSCULAR | Status: AC
  Administered 2016-09-10: 4 mg via INTRAVENOUS
  Filled 2016-09-10: qty 2

## 2016-09-10 MED ORDER — SODIUM CHLORIDE 0.9 % IV SOLN
INTRAVENOUS | Status: AC | PRN
  Administered 2016-09-10: 150 mL/h via INTRAVENOUS

## 2016-09-10 MED ORDER — PROPOFOL 10 MG/ML IV BOLUS
0.5000 mg/kg | Freq: Once | INTRAVENOUS | Status: DC
  Filled 2016-09-10: qty 20

## 2016-09-10 MED ORDER — HYDROMORPHONE HCL 1 MG/ML IJ SOLN
1.0000 mg | Freq: Once | INTRAMUSCULAR | Status: AC
Start: 2016-09-10 — End: 2016-09-10
  Administered 2016-09-10: 1 mg via INTRAVENOUS
  Filled 2016-09-10: qty 1

## 2016-09-10 MED ORDER — PROPOFOL 10 MG/ML IV BOLUS
INTRAVENOUS | Status: AC | PRN
  Administered 2016-09-10: 40 mg via INTRAVENOUS
  Administered 2016-09-10: 20 mg via INTRAVENOUS

## 2016-09-10 NOTE — ED Notes (Signed)
Consent signed for moderate sedation for reduction of right hip.

## 2016-09-10 NOTE — ED Triage Notes (Signed)
PT presents by EMS for evaluation of potential right hip dislocation. Pt reports that she was sitting up in bed and felt her right hip pop. Pt states that she was seen on 09/08/16 for right hip dislocation and was seen by orthopedics on 09/09/16 after having hip reduced in ED on 09/08/16. EMS advised to giving 269mcg Fentanyl for pain. Pt reports bilateral hip replacements.

## 2016-09-10 NOTE — ED Provider Notes (Signed)
  Physical Exam  BP (!) 141/66   Pulse 86   Temp 98.3 F (36.8 C) (Oral)   Resp 16   Ht 5\' 4"  (1.626 m)   Wt 62.1 kg (137 lb)   SpO2 100%   BMI 23.52 kg/m   Physical Exam  ED Course  Reduction of Right Hip Dislocation Date/Time: 09/10/2016 7:14 AM Performed by: Shary Decamp Authorized by: Shary Decamp  Consent: Verbal consent obtained. Written consent obtained. Risks and benefits: risks, benefits and alternatives were discussed Consent given by: patient Patient understanding: patient states understanding of the procedure being performed Patient consent: the patient's understanding of the procedure matches consent given Procedure consent: procedure consent matches procedure scheduled Relevant documents: relevant documents present and verified Required items: required blood products, implants, devices, and special equipment available Patient identity confirmed: verbally with patient and arm band Time out: Immediately prior to procedure a "time out" was called to verify the correct patient, procedure, equipment, support staff and site/side marked as required. Preparation: Patient was prepped and draped in the usual sterile fashion.  Sedation: Patient sedated: yes Sedatives: propofol Vitals: Vital signs were monitored during sedation. Patient tolerance: Patient tolerated the procedure well with no immediate complications Comments: Successful reduction of hip dislocation. NVI. Knee Immobilizer placed.           Shary Decamp, PA-C 09/10/16 0715    Ripley Fraise, MD 09/10/16 [phone]

## 2016-09-10 NOTE — Progress Notes (Signed)
RT present for the duration of open reduction sedation procedure.  Pt remained on EtCO2 monitoring and stable throughout the procedure.

## 2016-09-10 NOTE — ED Provider Notes (Signed)
[facility] DEPT Provider Note   CSN: [id number] Arrival date & time: 09/10/16  0541     History   Chief Complaint Chief Complaint  Patient presents with  . Hip Injury    HPI [name] is a 83 y.o. female.  The history is provided by the patient and a relative.  Hip Pain  This is a new problem. The current episode started 1 to 2 hours ago. The problem occurs constantly. The problem has been rapidly worsening. Pertinent negatives include no chest pain and no abdominal pain. Exacerbated by: movement. Nothing relieves the symptoms. She has tried rest (fentanyl) for the symptoms. The treatment provided no relief.   Patient presents with right hip pain, concern for right hip dislocation She has h/o bilateral hip replacements in the [place] she sat up in bed and felt a pop in right hip with immediate pain No falls reported No other pain reported She reports seen in ED on 6/16 for same issue and had closed reduction She saw her orthopedist yesterday who advised she may need surgery  Past Medical History:  Diagnosis Date  . ALLERGIC RHINITIS   . ANEMIA, IRON DEFICIENCY   . ANXIETY   . ARTHRITIS    s/p bilateral THR  . ASTHMA, EXTRINSIC   . Bronchitis   . Cataracts, both eyes   . Chronic constipation   . DIABETES MELLITUS, TYPE II   . FIBROMYALGIA    fibromyalgia  . G E R D   . High cholesterol   . HYPERLIPIDEMIA   . HYPERTENSION   . Interstitial cystitis   . MIGRAINE HEADACHE     Patient Active Problem List   Diagnosis Date Noted  . Type 2 diabetes mellitus without complication, without long-term current use of insulin ([place]) 01/28/2016  . Medication refused- (any cholesterol ones) 01/20/2016  . Environmental and seasonal allergies 01/20/2016  . Chronic fatigue syndrome with fibromyalgia 01/20/2016  . Vitamin D deficiency 01/19/2016  . Prediabetes- 6.0 12/17 01/11/2016  . Adjustment disorder with mixed anxiety and depressed mood 01/11/2016  .  Medication intolerance- statins- (make her FM much W) 01/10/2016  . Depression 11/24/2015  . Reaction, situational, acute, to stress(husband dying) 09/21/2015  . B12 deficiency 08/30/2015  . Dysuria 08/18/2015  . Chronic Fatigue- 2ndary to FM and Mood d/o  06/20/2015  . Memory loss   . Lumbar radiculopathy, chronic   . Herpes simplex labialis 04/03/2012  . Shingles 04/12/2011  . LBBB (left bundle branch block) 11/13/2010  . MIGRAINE HEADACHE 05/30/2010  . Iron deficiency anemia 11/03/2009  . Asthma, mild persistent 12/19/2008  . Hyperlipidemia 08/12/2007  . Anxiety state 08/12/2007  . Essential hypertension 08/12/2007  . Allergic rhinitis 08/12/2007  . G E R D 08/12/2007  . ARTHRITIS 08/12/2007  . Muscle pain, fibromyalgia 08/12/2007    Past Surgical History:  Procedure Laterality Date  . ABDOMINAL HYSTERECTOMY    . CATARACT EXTRACTION    . COLONOSCOPY    . DOPPLER ECHOCARDIOGRAPHY    . ESOPHAGOGASTRODUODENOSCOPY ENDOSCOPY    . LUMBAR DISC SURGERY    . NM MYOVIEW LTD     stress test  . Ovarian cyst removed    . TOTAL HIP ARTHROPLASTY      OB History    No data available       Home Medications    Prior to Admission medications   Medication Sig Start Date End Date Taking? Authorizing Provider  acetaminophen (TYLENOL) 500 MG tablet Take 1,000 mg by  mouth every 6 (six) hours as needed. For pain    [provider]  albuterol (PROAIR HFA) 108 (90 Base) MCG/ACT inhaler Inhale 2 puffs into the lungs every 6 (six) hours as needed. 09/04/16   Juanito Doom, MD  amLODipine (NORVASC) 2.5 MG tablet Take 5 mg by mouth daily.    [provider]  cetirizine (ZYRTEC) 10 MG tablet Take 10 mg by mouth daily.  03/13/12   Elsie Stain, MD  chlorthalidone (HYGROTON) 25 MG tablet TAKE 1/2 TABLET BY MOUTH DAILY 07/24/16   [name], MD  Cholecalciferol (VITAMIN D3) 5000 units TABS 5,000 IU OTC vitamin D3 daily. 01/19/16   [name], DO  diazepam  (VALIUM) 5 MG tablet 1/2 tab q 12 hrs PRN SEVERE panic anxiety 03/11/16   Opalski, Neoma Laming, DO  Docusate Sodium (STOOL SOFTENER) 100 MG capsule Take 100 mg by mouth daily.      [provider]  DULoxetine (CYMBALTA) 30 MG capsule Take 1 capsule (30 mg total) by mouth daily. 08/26/16   Opalski, Deborah, DO  escitalopram (LEXAPRO) 20 MG tablet Take 1 tablet (20 mg total) by mouth daily. 07/29/16   Opalski, Neoma Laming, DO  fluticasone (FLONASE) 50 MCG/ACT nasal spray USE 2 SPRAYS INTO EACH NOSTRIL EVERY DAY 06/04/13   Rowe Clack, MD  HYDROcodone-acetaminophen (NORCO/VICODIN) 5-325 MG tablet Take 1 tablet by mouth every 4 (four) hours as needed. 09/07/16   Hedges, Dellis Filbert, PA-C  losartan (COZAAR) 100 MG tablet Take 1 tablet (100 mg total) by mouth daily. 08/30/16   Opalski, Neoma Laming, DO  meloxicam (MOBIC) 15 MG tablet TAKE 1 TABLET BY MOUTH DAILY 07/02/16   Danford, Valetta Fuller D, NP  montelukast (SINGULAIR) 10 MG tablet TAKE 1 TABLET BY MOUTH EACH NIGHT AT BEDTIME 09/06/16   Juanito Doom, MD  omeprazole (PRILOSEC) 20 MG capsule Take 1 capsule (20 mg total) by mouth daily. 08/26/16   Opalski, Deborah, DO  SYMBICORT 80-4.5 MCG/ACT inhaler INHALE 2 PUFFS BY MOUTH TWICE DAILY 09/05/16   Juanito Doom, MD  traMADol (ULTRAM) 50 MG tablet Take 50 mg by mouth every 6 (six) hours as needed. Reported on 05/23/2015    [provider]    Family History Family History  Problem Relation Age of Onset  . Arthritis Mother   . Hypertension Mother   . Heart disease Father   . Arthritis Father   . Hypertension Father   . Depression Sister   . Hyperlipidemia Sister   . Hypertension Other   . Hyperlipidemia Other     Social History Social History  Substance Use Topics  . Smoking status: Former Smoker    Packs/day: 0.30    Years: 10.00    Types: Cigarettes    Quit date: 03/25/1968  . 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 Alcohol use No      Allergies   Morphine sulfate   Review of Systems Review of Systems  Constitutional: Negative for fever.  Cardiovascular: Negative for chest pain.  Gastrointestinal: Negative for abdominal pain.  Musculoskeletal: Positive for arthralgias.  All other systems reviewed and are negative.    Physical Exam Updated Vital Signs Ht 1.626 m (5\' 4" )   Wt 62.1 kg (137 lb)   BMI 23.52 kg/m   Physical Exam CONSTITUTIONAL: elderly and anxious HEAD: Normocephalic/atraumatic EYES: EOMI  ENMT: Mucous membranes moist NECK: supple no meningeal signs CV: S1/S2 noted, no murmurs/rubs/gallops noted LUNGS:  Lungs are clear to auscultation bilaterally, no apparent distress ABDOMEN: soft, nontender  NEURO: Pt is awake/alert/appropriate, moves all extremitiesx4.  She is able to move toes on right foot without difficulty EXTREMITIES: pulses normal/equal, distal pulses intact Right hip - deformity noted.  Tenderness to palpation of right hip No other tenderness to lower extremities SKIN: warm, color normal PSYCH: no abnormalities of mood noted, alert and oriented to situation   ED Treatments / Results  Labs (all labs ordered are listed, but only abnormal results are displayed) Labs Reviewed  BASIC METABOLIC PANEL - Abnormal; Notable for the following:       Result Value   Glucose, Bld 136 (*)    BUN 28 (*)    GFR calc non Af Amer 55 (*)    All other components within normal limits  CBC  TYPE AND SCREEN  ABO/RH    EKG  EKG Interpretation None       Radiology Dg Hip Unilat W Or Wo Pelvis 1 View Right  Result Date: 09/10/2016 CLINICAL DATA:  83 year old female post reduction right hip dislocation. Subsequent encounter. EXAM: DG HIP (WITH OR WITHOUT PELVIS) 1V RIGHT COMPARISON:  09/10/2016 and 09/08/2026. FINDINGS: Frontal view of the right hip reveals the femoral head component of total hip prosthesis centered in the region of the acetabular component suggesting reduction.  Slight irregularity of the right pelvic sidewall (medial to the acetabular component) without change from 09/07/2016. IMPRESSION: Single view right hip reveals reduction of the right hip prosthesis. Electronically Signed   By: Genia Del M.D.   On: 09/10/2016 07:45   Dg Hip Unilat  With Pelvis 2-3 Views Right  Result Date: 09/10/2016 CLINICAL DATA:  Right hip pain. Felt hip pop out while sitting in a chair. 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 Acetabular cup remains seated. No evidence of acute fracture. Left hip arthroplasty remains intact. Pubic symphysis and sacroiliac joints are congruent. 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 No evidence of acute fracture. Electronically Signed   By: Jeb Levering M.D.   On: 09/10/2016 06:42    Procedures Reduction of dislocation Date/Time: 09/10/2016 7:05 AM Performed by: Ripley Fraise Authorized by: Ripley Fraise  Consent: Written consent obtained. Consent given by: patient and power of attorney Patient identity confirmed: verbally with patient, arm band and provided demographic data Time out: Immediately prior to procedure a "time out" was called to verify the correct patient, procedure, equipment, support staff and site/side marked as required.  Sedation: Patient sedated: yes Vitals: Vital signs were monitored during sedation. Patient tolerance: Patient tolerated the procedure well with no immediate complications Comments: Closed reduction of right hip successful No immediate complications noted     Procedural sedation Performed by: Sharyon Cable Consent: Verbal consent obtained. Risks and benefits: risks, benefits and alternatives were discussed Required items: required  devices, and special equipment available Patient identity  confirmed: arm band and provided demographic data Time out: Immediately prior to procedure a "time out" was called to verify the correct patient, procedure, equipment, support staff and site/side marked as required. Sedation type: moderate (conscious) sedation NPO time confirmed and considered Sedatives: PROPOFOL Physician Time at Bedside: 18 Vitals: Vital signs were monitored during sedation. Cardiac Monitor, pulse oximeter Patient tolerance: Patient tolerated the procedure well with no immediate complications. Comments: Pt with uneventful recovery. Returned to pre-procedural sedation baseline  Medications Ordered in ED Medications  propofol (DIPRIVAN) 10 mg/mL bolus/IV push 31.1 mg (not administered)  HYDROmorphone (DILAUDID) injection 1 mg (1 mg Intravenous Given 09/10/16 0620)  ondansetron (ZOFRAN) injection 4 mg (4 mg Intravenous Given 09/10/16 0620)  0.9 %  sodium chloride infusion (150 mL/hr Intravenous New Bag/Given 09/10/16 0702)  propofol (DIPRIVAN) 10 mg/mL bolus/IV push (20 mg Intravenous Given 09/10/16 0706)     Initial Impression / Assessment and Plan / ED Course  I have reviewed the triage vital signs and the nursing notes.  Pertinent labs & imaging results that were available during my care of the patient were reviewed by me and considered in my medical decision making (see chart for details).     7:39 AM Pt with recurrent right prosthetic hip dislocation We reduced right hip without difficulty  I spoke to Dr Alvan Dame with ortho He would like patient to be seen in his office on 6/21 and potential surgery next week 8:07 AM Pt stable Awake/alert She has no pain She is comfortable going home She declines home health She feels comfortable with using knee immobilizer which she has at home    Final Clinical Impressions(s) / ED Diagnoses   Final diagnoses:  Dislocation of right hip, initial encounter [facility])    New Prescriptions New Prescriptions   No  medications on file     Ripley Fraise, MD 09/10/16 [phone]

## 2016-09-12 DIAGNOSIS — Z96649 Presence of unspecified artificial hip joint: Secondary | ICD-10-CM | POA: Diagnosis not present

## 2016-09-12 DIAGNOSIS — T84028A Dislocation of other internal joint prosthesis, initial encounter: Secondary | ICD-10-CM | POA: Diagnosis not present

## 2016-09-12 DIAGNOSIS — Z96641 Presence of right artificial hip joint: Secondary | ICD-10-CM | POA: Diagnosis not present

## 2016-09-12 DIAGNOSIS — S73004A Unspecified dislocation of right hip, initial encounter: Secondary | ICD-10-CM | POA: Diagnosis not present

## 2016-09-16 ENCOUNTER — Telehealth: Payer: Self-pay | Admitting: Family Medicine

## 2016-09-16 NOTE — Telephone Encounter (Signed)
Pt called states she is having surgery w/Dr. Alvan Dame on July 3rd & his office has sent a request for info-- Please submit req'd info asap. --glh

## 2016-09-17 ENCOUNTER — Ambulatory Visit (INDEPENDENT_AMBULATORY_CARE_PROVIDER_SITE_OTHER): Payer: Medicare Other | Admitting: Adult Health

## 2016-09-17 ENCOUNTER — Encounter: Payer: Self-pay | Admitting: Adult Health

## 2016-09-17 VITALS — BP 122/74 | HR 78 | Wt 131.0 lb

## 2016-09-17 DIAGNOSIS — I10 Essential (primary) hypertension: Secondary | ICD-10-CM

## 2016-09-17 DIAGNOSIS — I447 Left bundle-branch block, unspecified: Secondary | ICD-10-CM

## 2016-09-17 DIAGNOSIS — J4531 Mild persistent asthma with (acute) exacerbation: Secondary | ICD-10-CM

## 2016-09-17 DIAGNOSIS — Z01818 Encounter for other preprocedural examination: Secondary | ICD-10-CM

## 2016-09-17 NOTE — Assessment & Plan Note (Signed)
Chronic in nature and identified on previous/mutiple EKGs. Denies current cardiac sx's.

## 2016-09-17 NOTE — Telephone Encounter (Signed)
Patient seen in office for surgery clearance.

## 2016-09-17 NOTE — Progress Notes (Signed)
Subjective:    Patient ID: [name], female    DOB: [birth year], 83 y.o.   MRN: [id number]  HPI:  [name] is here for medial clearance for upcoming R hip revision RTHA acetabular vs conrtained linear procedure, scheduled for 09/24/2016.  She was seen by cards/Pharm D 08/28/2016-no change to meds and instructed to call clinic if SBPs are consistently 140-160s.  She reports home SBPs 110-120s. R hip pain well controlled with oral medication, R knee brace and walker. She was seen by pulmonology 09/03/16, Symbicort d/c'd, and instructed to all pulm office if she is needing Albuterol > 2 times/week.  She reports using Albuterol "never!"-GREAT! She denies any other changes to current medications and denies SE. She denies tobacco/ETOH use.  Patient Care Team    Relationship Specialty Notifications Start End  Sonya Dance, DO PCP - General Family Medicine  08/30/15   Sonya Artist, MD Referring Physician Gastroenterology  07/02/10   Sonya Broker, MD Referring Physician Urology  07/02/10   Sonya Jacks, MD Referring Physician Ophthalmology  07/02/10   Sonya Putnam, MD Referring Physician Hematology and Oncology  07/02/10   Sonya Maudlin, MD Referring Physician Orthopedic Surgery  07/02/10   Sonya Sato, MD (Inactive) Referring Physician Orthopedic Surgery  07/02/10   Sonya Harp, MD Referring Physician Cardiology  07/02/10   Sonya Sensing, MD Consulting Physician Dermatology  11/09/15   Sonya Doom, MD Consulting Physician Pulmonary Disease  11/09/15   Sonya Broad, MD Consulting Physician Physical Medicine and Rehabilitation  11/09/15    Comment: pain mgt :  injections and pain meds  [name], RPH-CPP Pharmacist Cardiology  11/19/15    Comment:  " [facility]" - med mgt of her HTN per request of [name]- Cards  Sonya Berthold, DO Consulting Physician Neurology  12/17/15   [name], [facility]  Pharmacist  07/29/16    Comment: clinical pharm-D in HTN clinic     Patient Active Problem List   Diagnosis Date Noted  . Type 2 diabetes mellitus without complication, without long-term current use of insulin ([place]) 01/28/2016  . Medication refused- (any cholesterol ones) 01/20/2016  . Environmental and seasonal allergies 01/20/2016  . Chronic fatigue syndrome with fibromyalgia 01/20/2016  . Vitamin D deficiency 01/19/2016  . Prediabetes- 6.0 12/17 01/11/2016  . Adjustment disorder with mixed anxiety and depressed mood 01/11/2016  . Medication intolerance- statins- (make her FM much W) 01/10/2016  . Depression 11/24/2015  . Reaction, situational, acute, to stress(husband dying) 09/21/2015  . B12 deficiency 08/30/2015  . Dysuria 08/18/2015  . Chronic Fatigue- 2ndary to FM and Mood d/o  06/20/2015  . Memory loss   . Lumbar radiculopathy, chronic   . Herpes simplex labialis 04/03/2012  . Shingles 04/12/2011  . LBBB (left bundle branch block) 11/13/2010  . MIGRAINE HEADACHE 05/30/2010  . Iron deficiency anemia 11/03/2009  . Asthma, mild persistent 12/19/2008  . Hyperlipidemia 08/12/2007  . Anxiety state 08/12/2007  . Essential hypertension 08/12/2007  . Allergic rhinitis 08/12/2007  . G E R D 08/12/2007  . ARTHRITIS 08/12/2007  . Muscle pain, fibromyalgia 08/12/2007     Past Medical History:  Diagnosis Date  . ALLERGIC RHINITIS   . ANEMIA, IRON DEFICIENCY   . ANXIETY   . ARTHRITIS    s/p bilateral THR  . ASTHMA, EXTRINSIC   . Bronchitis   . Cataracts, both eyes   . Chronic constipation   . DIABETES MELLITUS, TYPE II   .  FIBROMYALGIA    fibromyalgia  . G E R D   . High cholesterol   . HYPERLIPIDEMIA   . HYPERTENSION   . Interstitial cystitis   . MIGRAINE HEADACHE      Past Surgical History:  Procedure Laterality Date  . ABDOMINAL HYSTERECTOMY    . CATARACT EXTRACTION    . COLONOSCOPY    . DOPPLER ECHOCARDIOGRAPHY    . ESOPHAGOGASTRODUODENOSCOPY ENDOSCOPY    . LUMBAR DISC SURGERY    . NM MYOVIEW LTD     stress test  .  Ovarian cyst removed    . TOTAL HIP ARTHROPLASTY       Family History  Problem Relation Age of Onset  . Arthritis Mother   . Hypertension Mother   . Heart disease Father   . Arthritis Father   . Hypertension Father   . Depression Sister   . Hyperlipidemia Sister   . Hypertension Other   . Hyperlipidemia Other      History  Drug Use No     History  Alcohol Use No     History  Smoking Status  . Former Smoker  . Packs/day: 0.30  . Years: 10.00  . Types: Cigarettes  . Quit date: 03/25/1968  Smokeless Tobacco  . 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 acetaminophen (TYLENOL) 500 MG tablet Take 1,000 mg by mouth every 6 (six) hours as needed. For pain   . albuterol (PROAIR HFA) 108 (90 Base) MCG/ACT inhaler Inhale 2 puffs into the lungs every 6 (six) hours as needed.   Marland Kitchen amLODipine (NORVASC) 2.5 MG tablet Take 5 mg by mouth daily.   . cetirizine (ZYRTEC) 10 MG tablet Take 10 mg by mouth daily.    . chlorthalidone (HYGROTON) 25 MG tablet TAKE 1/2 TABLET BY MOUTH DAILY   . Cholecalciferol (VITAMIN D3) 5000 units TABS 5,000 IU OTC vitamin D3 daily.   . cyanocobalamin (,VITAMIN B-12,) 1000 MCG/ML injection Inject 1,000 mcg into the muscle every 30 (thirty) days.   . diazepam (VALIUM) 5 MG tablet 1/2 tab q 12 hrs PRN SEVERE panic anxiety   . Docusate Sodium (STOOL SOFTENER) 100 MG capsule Take 100 mg by mouth daily.   07/29/2016: Taking 300mg  daily as needed   . DULoxetine (CYMBALTA) 30 MG capsule Take 1 capsule (30 mg total) by mouth daily.   Marland Kitchen escitalopram (LEXAPRO) 20 MG tablet Take 1 tablet (20 mg total) by mouth daily.   . fluticasone (FLONASE) 50 MCG/ACT nasal spray USE 2 SPRAYS INTO EACH NOSTRIL EVERY DAY   . losartan (COZAAR) 100 MG tablet Take 1 tablet (100 mg total) by mouth daily.   . meloxicam (MOBIC) 15 MG tablet TAKE 1 TABLET BY MOUTH DAILY   . montelukast  (SINGULAIR) 10 MG tablet TAKE 1 TABLET BY MOUTH EACH NIGHT AT BEDTIME   . omeprazole (PRILOSEC) 20 MG capsule Take 1 capsule (20 mg total) by mouth daily.   . SYMBICORT 80-4.5 MCG/ACT inhaler INHALE 2 PUFFS BY MOUTH TWICE DAILY   . traMADol (ULTRAM) 50 MG tablet Take 50 mg by mouth every 6 (six) hours as needed. Reported on 05/23/2015   . HYDROcodone-acetaminophen (NORCO/VICODIN) 5-325 MG tablet Take 1 tablet by mouth every 4 (four) hours as needed. (Patient not taking: Reported on 09/17/2016)    No facility-administered encounter medications on file as of 09/17/2016.  Allergies: Morphine sulfate  Body mass index is 22.49 kg/m.  Blood pressure 122/74, pulse 78, weight 131 lb (59.4 kg).     Review of Systems  Constitutional: Positive for activity change and fatigue. Negative for appetite change, chills, diaphoresis, fever and unexpected weight change.  Respiratory: Negative for cough, chest tightness, shortness of breath, wheezing and stridor.   Cardiovascular: Negative for chest pain, palpitations and leg swelling.  Gastrointestinal: Negative for abdominal distention, abdominal pain, blood in stool, constipation, diarrhea, nausea and vomiting.  Endocrine: Negative for cold intolerance, heat intolerance, polydipsia, polyphagia and polyuria.  Genitourinary: Negative for difficulty urinating, flank pain and hematuria.  Musculoskeletal: Positive for arthralgias, back pain, gait problem, joint swelling and myalgias.  Skin: Negative for color change, pallor, rash and wound.  Allergic/Immunologic: Negative for immunocompromised state.  Neurological: Negative for headaches.  Hematological: Does not bruise/bleed easily.       Objective:   Physical Exam  Constitutional: She is oriented to person, place, and time. She appears well-developed and well-nourished. No distress.  Cardiovascular: Normal rate, regular rhythm, normal heart sounds and intact distal pulses.   No murmur  heard. Pulmonary/Chest: Effort normal and breath sounds normal. No respiratory distress. She has no wheezes. She has no rales. She exhibits no tenderness.  Musculoskeletal: She exhibits tenderness.       Right hip: She exhibits decreased range of motion, decreased strength and tenderness.  Neurological: She is alert and oriented to person, place, and time. Coordination normal.  Skin: Skin is warm and dry. No rash noted. She is not diaphoretic. No erythema. No pallor.  Psychiatric: She has a normal mood and affect. Her behavior is normal. Judgment and thought content normal.  Nursing note and vitals reviewed.         Assessment & Plan:   1. Pre-op exam   2. Essential hypertension   3. Mild persistent asthma with acute exacerbation   4. LBBB (left bundle branch block)     Essential hypertension BP 122/74-at goal! Continue Losartan 100mg  daily, Amlodipine 2.5mg  daily, Chlorthalidone 25mg  daily. Last Cards PharmD OV 08/28/2016-no change to meds and instructed to call cards if SBP consistently 140-160s Sonya. [name] OV March 2018, EKG - NSR with L axis deviation, L BBB, abnormal EKG. Today EKG-NSR with L axis deviation, L BBB, abnormal EKG.   L BBB chronic in nature, identified on previous/mutiple EKGs.  Asthma, mild persistent Last seen by pulmonology 09/03/2016 Symbicort d/c's and instructed to call clinic is she needs to use Albuterol > 2 times/week. She has not required albuterol in months.  LBBB (left bundle branch block) Chronic in nature and identified on previous/mutiple EKGs. Denies current cardiac sx's.  MEDICAL CLEARANCE FOR R HIP SURGERY COMPLETED.   FOLLOW-UP:  Return if symptoms worsen or fail to improve.

## 2016-09-17 NOTE — Assessment & Plan Note (Addendum)
Last seen by pulmonology 09/03/2016 Symbicort d/c's and instructed to call clinic is she needs to use Albuterol > 2 times/week. She has not required albuterol in months.

## 2016-09-17 NOTE — Patient Instructions (Addendum)
Hip Pain The hip is the joint between the upper legs and the lower pelvis. The bones, cartilage, tendons, and muscles of your hip joint support your body and allow you to move around. Hip pain can range from a minor ache to severe pain in one or both of your hips. The pain may be felt on the inside of the hip joint near the groin, or the outside near the buttocks and upper thigh. You may also have swelling or stiffness. Follow these instructions at home: Managing pain, stiffness, and swelling  If directed, apply ice to the injured area. ? Put ice in a plastic bag. ? Place a towel between your skin and the bag. ? Leave the ice on for 20 minutes, 2-3 times a day  Sleep with a pillow between your legs on your most comfortable side.  Avoid any activities that cause pain. General instructions  Take over-the-counter and prescription medicines only as told by your health care provider.  Do any exercises as told by your health care provider.  Record the following: ? How often you have hip pain. ? The location of your pain. ? What the pain feels like. ? What makes the pain worse.  Keep all follow-up visits as told by your health care provider. This is important. Contact a health care provider if:  You cannot put weight on your leg.  Your pain or swelling continues or gets worse after one week.  It gets harder to walk.  You have a fever. Get help right away if:  You fall.  You have a sudden increase in pain and swelling in your hip.  Your hip is red or swollen or very tender to touch. Summary  Hip pain can range from a minor ache to severe pain in one or both of your hips.  The pain may be felt on the inside of the hip joint near the groin, or the outside near the buttocks and upper thigh.  Avoid any activities that cause pain.  Record how often you have hip pain, the location of the pain, what makes it worse and what it feels like. This information is not intended to  replace advice given to you by your health care provider. Make sure you discuss any questions you have with your health care provider. Document Released: 08/29/2009 Document Revised: 02/12/2016 Document Reviewed: 02/12/2016 Elsevier Interactive Patient Education  2017 [place] all medications as directed and use of brace/walker. Cleared for surgery. Please call clinic with any questions/concerns.

## 2016-09-17 NOTE — Assessment & Plan Note (Addendum)
BP 122/74-at goal! Continue Losartan 100mg  daily, Amlodipine 2.5mg  daily, Chlorthalidone 25mg  daily. Last Cards PharmD OV 08/28/2016-no change to meds and instructed to call cards if SBP consistently 140-160s Dr. [name] Cardiology OV March 2018, EKG - NSR with L axis deviation, L BBB, abnormal EKG. Today EKG-NSR with L axis deviation, L BBB, abnormal EKG.   L BBB chronic in nature, identified on previous/mutiple EKGs.

## 2016-09-19 ENCOUNTER — Other Ambulatory Visit (HOSPITAL_COMMUNITY): Payer: Self-pay | Admitting: Emergency Medicine

## 2016-09-19 NOTE — Progress Notes (Signed)
LOV/Medical clearance Danford NP 09-17-16 epic  EKG 09-17-16 epic  CBC, CMP, Type and screen 09-10-16, HgA1C 07-29-16 epic  [facility] [name] MD 07-05-16 epic  [facility] McQuaid MD 09-03-16 epic

## 2016-09-19 NOTE — Patient Instructions (Signed)
[name]  09/19/2016   Your procedure is scheduled on: 09-24-16  Report to [facility]  Entrance   Report to admitting at 610AM   Call this number if you have problems the morning of surgery [phone]   Remember: ONLY 1 PERSON MAY GO WITH YOU TO SHORT STAY TO GET  READY MORNING OF YOUR SURGERY.  Do not eat food or drink liquids :After Midnight.     Take these medicines the morning of surgery with A SIP OF WATER: amlodipine(norvasc), duloxetine(cymbalta), escitalopram(lexapro), cetirizine(zyrtec), nasal spray as needed, tramadol as needed, inhaler as needed (may bring to hospital)                                 You may not have any metal on your body including hair pins and              piercings  Do not wear jewelry, make-up, lotions, powders or perfumes, deodorant             Do not wear nail polish.  Do not shave  48 hours prior to surgery.            Do not bring valuables to the hospital. [name]  Contacts, dentures or bridgework may not be worn into surgery.  Leave suitcase in the car. After surgery it may be brought to your room.               Please read over the following fact sheets you were given: _____________________________________________________________________           [facility] - Preparing for Surgery Before surgery, you can play an important role.  Because skin is not sterile, your skin needs to be as free of germs as possible.  You can reduce the number of germs on your skin by washing with CHG (chlorahexidine gluconate) soap before surgery.  CHG is an antiseptic cleaner which kills germs and bonds with the skin to continue killing germs even after washing. Please DO NOT use if you have an allergy to CHG or antibacterial soaps.  If your skin becomes reddened/irritated stop using the CHG and inform your nurse when you arrive at Short Stay. Do not shave (including legs  and underarms) for at least 48 hours prior to the first CHG shower.  You may shave your face/neck. Please follow these instructions carefully:  1.  Shower with CHG Soap the night before surgery and the  morning of Surgery.  2.  If you choose to wash your hair, wash your hair first as usual with your  normal  shampoo.  3.  After you shampoo, rinse your hair and body thoroughly to remove the  shampoo.                           4.  Use CHG as you would any other liquid soap.  You can apply chg directly  to the skin and wash                       Gently with a scrungie or clean washcloth.  5.  Apply the CHG Soap to your body  ONLY FROM THE NECK DOWN.   Do not use on face/ open                           Wound or open sores. Avoid contact with eyes, ears mouth and genitals (private parts).                       Wash face,  Genitals (private parts) with your normal soap.             6.  Wash thoroughly, paying special attention to the area where your surgery  will be performed.  7.  Thoroughly rinse your body with warm water from the neck down.  8.  DO NOT shower/wash with your normal soap after using and rinsing off  the CHG Soap.                9.  Pat yourself dry with a clean towel.            10.  Wear clean pajamas.            11.  Place clean sheets on your bed the night of your first shower and do not  sleep with pets. Day of Surgery : Do not apply any lotions/deodorants the morning of surgery.  Please wear clean clothes to the hospital/surgery center.  FAILURE TO FOLLOW THESE INSTRUCTIONS MAY RESULT IN THE CANCELLATION OF YOUR SURGERY PATIENT SIGNATURE_________________________________  NURSE SIGNATURE__________________________________  ________________________________________________________________________   [name]  An incentive spirometer is a tool that can help keep your lungs clear and active. This tool measures how well you are filling your lungs with each breath.  Taking long deep breaths may help reverse or decrease the chance of developing breathing (pulmonary) problems (especially infection) following:  A long period of time when you are unable to move or be active. BEFORE THE PROCEDURE   If the spirometer includes an indicator to show your best effort, your nurse or respiratory therapist will set it to a desired goal.  If possible, sit up straight or lean slightly forward. Try not to slouch.  Hold the incentive spirometer in an upright position. INSTRUCTIONS FOR USE  1. Sit on the edge of your bed if possible, or sit up as far as you can in bed or on a chair. 2. Hold the incentive spirometer in an upright position. 3. Breathe out normally. 4. Place the mouthpiece in your mouth and seal your lips tightly around it. 5. Breathe in slowly and as deeply as possible, raising the piston or the ball toward the top of the column. 6. Hold your breath for 3-5 seconds or for as long as possible. Allow the piston or ball to fall to the bottom of the column. 7. Remove the mouthpiece from your mouth and breathe out normally. 8. Rest for a few seconds and repeat Steps 1 through 7 at least 10 times every 1-2 hours when you are awake. Take your time and take a few normal breaths between deep breaths. 9. The spirometer may include an indicator to show your best effort. Use the indicator as a goal to work toward during each repetition. 10. After each set of 10 deep breaths, practice coughing to be sure your lungs are clear. If you have an incision (the cut made at the time of surgery), support your incision when coughing by placing a pillow or rolled up towels firmly against it. Once  you are able to get out of bed, walk around indoors and cough well. You may stop using the incentive spirometer when instructed by your caregiver.  RISKS AND COMPLICATIONS  Take your time so you do not get dizzy or light-headed.  If you are in pain, you may need to take or ask for pain  medication before doing incentive spirometry. It is harder to take a deep breath if you are having pain. AFTER USE  Rest and breathe slowly and easily.  It can be helpful to keep track of a log of your progress. Your caregiver can provide you with a simple table to help with this. If you are using the spirometer at home, follow these instructions: [place] IF:   You are having difficultly using the spirometer.  You have trouble using the spirometer as often as instructed.  Your pain medication is not giving enough relief while using the spirometer.  You develop fever of 100.5 F (38.1 C) or higher. SEEK IMMEDIATE MEDICAL CARE IF:   You cough up bloody sputum that had not been present before.  You develop fever of 102 F (38.9 C) or greater.  You develop worsening pain at or near the incision site. MAKE SURE YOU:   Understand these instructions.  Will watch your condition.  Will get help right away if you are not doing well or get worse. Document Released: 07/22/2006 Document Revised: 06/03/2011 Document Reviewed: 09/22/2006 ExitCare Patient Information 2014 Exit[employer], [place].   ________________________________________________________________________  WHAT IS A BLOOD TRANSFUSION? Blood Transfusion Information  A transfusion is the replacement of blood or some of its parts. Blood is made up of multiple cells which provide different functions.  Red blood cells carry oxygen and are used for blood loss replacement.  White blood cells fight against infection.  Platelets control bleeding.  Plasma helps clot blood.  Other blood products are available for specialized needs, such as hemophilia or other clotting disorders. BEFORE THE TRANSFUSION  Who gives blood for transfusions?   Healthy volunteers who are fully evaluated to make sure their blood is safe. This is blood bank blood. Transfusion therapy is the safest it has ever been in the practice of medicine.  Before blood is taken from a donor, a complete history is taken to make sure that person has no history of diseases nor engages in risky social behavior (examples are intravenous drug use or sexual activity with multiple partners). The donor's travel history is screened to minimize risk of transmitting infections, such as malaria. The donated blood is tested for signs of infectious diseases, such as HIV and hepatitis. The blood is then tested to be sure it is compatible with you in order to minimize the chance of a transfusion reaction. If you or a relative donates blood, this is often done in anticipation of surgery and is not appropriate for emergency situations. It takes many days to process the donated blood. RISKS AND COMPLICATIONS Although transfusion therapy is very safe and saves many lives, the main dangers of transfusion include:   Getting an infectious disease.  Developing a transfusion reaction. This is an allergic reaction to something in the blood you were given. Every precaution is taken to prevent this. The decision to have a blood transfusion has been considered carefully by your caregiver before blood is given. Blood is not given unless the benefits outweigh the risks. AFTER THE TRANSFUSION  Right after receiving a blood transfusion, you will usually feel much better and more energetic. This  is especially true if your red blood cells have gotten low (anemic). The transfusion raises the level of the red blood cells which carry oxygen, and this usually causes an energy increase.  The nurse administering the transfusion will monitor you carefully for complications. HOME CARE INSTRUCTIONS  No special instructions are needed after a transfusion. You may find your energy is better. Speak with your caregiver about any limitations on activity for underlying diseases you may have. SEEK MEDICAL CARE IF:   Your condition is not improving after your transfusion.  You develop redness or  irritation at the intravenous (IV) site. SEEK IMMEDIATE MEDICAL CARE IF:  Any of the following symptoms occur over the next 12 hours:  Shaking chills.  You have a temperature by mouth above 102 F (38.9 C), not controlled by medicine.  Chest, back, or muscle pain.  People around you feel you are not acting correctly or are confused.  Shortness of breath or difficulty breathing.  Dizziness and fainting.  You get a rash or develop hives.  You have a decrease in urine output.  Your urine turns a dark color or changes to pink, red, or brown. Any of the following symptoms occur over the next 10 days:  You have a temperature by mouth above 102 F (38.9 C), not controlled by medicine.  Shortness of breath.  Weakness after normal activity.  The white part of the eye turns yellow (jaundice).  You have a decrease in the amount of urine or are urinating less often.  Your urine turns a dark color or changes to pink, red, or brown. Document Released: 03/08/2000 Document Revised: 06/03/2011 Document Reviewed: 10/26/2007 [facility] Patient Information 2014 [place], [place].  _______________________________________________________________________

## 2016-09-20 ENCOUNTER — Encounter (HOSPITAL_COMMUNITY)
Admission: RE | Admit: 2016-09-20 | Discharge: 2016-09-20 | Disposition: A | Discharge status: Home / Self Care | Payer: Medicare Other | Source: Ambulatory Visit | Attending: Orthopedic Surgery | Admitting: Orthopedic Surgery

## 2016-09-20 ENCOUNTER — Encounter (HOSPITAL_COMMUNITY): Payer: Self-pay

## 2016-09-20 DIAGNOSIS — Z01818 Encounter for other preprocedural examination: Secondary | ICD-10-CM | POA: Diagnosis not present

## 2016-09-20 DIAGNOSIS — M1611 Unilateral primary osteoarthritis, right hip: Secondary | ICD-10-CM | POA: Insufficient documentation

## 2016-09-20 LAB — SURGICAL PCR SCREEN
MRSA, PCR: NEGATIVE
STAPHYLOCOCCUS AUREUS: NEGATIVE

## 2016-09-22 NOTE — H&P (Signed)
TOTAL HIP REVISION ADMISSION H&P  Patient is admitted for right revision total hip arthroplasty, acetabulum vs constrained liner.  Subjective:  Chief Complaint:   Right total hip arthroplasty instability  HPI: [name], 83 y.o. 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 The indications for the revision total hip arthroplasty are instability.  Onset of symptoms was abrupt starting ~1 years ago with gradually worsening course since that time.  Prior procedures on the right hip include arthroplasty.  Patient currently rates pain in the right hip at 10 out of 10 with activity.  There is night pain, worsening of pain with activity and weight bearing, pain that interfers with activities of daily living and pain with passive range of motion. Patient has evidence of previous THA by imaging studies.  This condition presents safety issues increasing the risk of falls.   There is no current active infection.   Risks, benefits and expectations were discussed with the patient.  Risks including but not limited to the risk of anesthesia, blood clots, nerve damage, blood vessel damage, failure of the prosthesis, infection and up to and including death.  Patient understand the risks, benefits and expectations and wishes to proceed with surgery.   PCP: [name], DO  D/C Plans:       Home   Post-op Meds:       No Rx given   Tranexamic Acid:      To be given - IV   Decadron:      Is to be given  FYI:     ASA  [place]  DME:   Pt already has equipment   PT:   No PT    Patient Active Problem List   Diagnosis Date Noted  . Type 2 diabetes mellitus without complication, without long-term current use of insulin ([place]) 01/28/2016  . Medication refused- (any cholesterol ones)  01/20/2016  . Environmental and seasonal allergies 01/20/2016  . Chronic fatigue syndrome with fibromyalgia 01/20/2016  . Vitamin D deficiency 01/19/2016  . Prediabetes- 6.0 12/17 01/11/2016  . Adjustment disorder with mixed anxiety and depressed mood 01/11/2016  . Medication intolerance- statins- (make her FM much W) 01/10/2016  . Depression 11/24/2015  . Reaction, situational, acute, to stress(husband dying) 09/21/2015  . B12 deficiency 08/30/2015  . Dysuria 08/18/2015  . Chronic Fatigue- 2ndary to FM and Mood d/o  06/20/2015  . Memory loss   . Lumbar radiculopathy, chronic   . Herpes simplex labialis 04/03/2012  . Shingles 04/12/2011  . LBBB (left bundle branch block) 11/13/2010  . MIGRAINE HEADACHE 05/30/2010  . Iron deficiency anemia 11/03/2009  . Asthma, mild persistent 12/19/2008  . Hyperlipidemia 08/12/2007  . Anxiety state 08/12/2007  . Essential hypertension 08/12/2007  . Allergic rhinitis 08/12/2007  . G E R D 08/12/2007  . ARTHRITIS 08/12/2007  . Muscle pain, fibromyalgia 08/12/2007   Past Medical History:  Diagnosis Date  . ALLERGIC RHINITIS   . ANEMIA, IRON DEFICIENCY   . ANXIETY   . ARTHRITIS    s/p bilateral THR  . ASTHMA, EXTRINSIC   . Bronchitis   . Cataracts, both eyes   . Chronic constipation   . FIBROMYALGIA    fibromyalgia  . G E R D   . High cholesterol   . HYPERLIPIDEMIA   . HYPERTENSION   .  Interstitial cystitis   . MIGRAINE HEADACHE   . Pre-diabetes     Past Surgical History:  Procedure Laterality Date  . ABDOMINAL HYSTERECTOMY    . APPENDECTOMY    . BACK SURGERY    . CATARACT EXTRACTION    . COLONOSCOPY    . DOPPLER ECHOCARDIOGRAPHY    . ESOPHAGOGASTRODUODENOSCOPY ENDOSCOPY    . LUMBAR DISC SURGERY    . NM MYOVIEW LTD     stress test  . Ovarian cyst removed    . ROTATOR CUFF REPAIR Left 5-6 years ago   Dr Onnie Graham; done at [facility]   . TOTAL HIP ARTHROPLASTY      No prescriptions prior to admission.   Allergies   Allergen Reactions  . Morphine Sulfate Other (See Comments)    Makes her hyper    Social History  Substance Use Topics  . Smoking status: Former Smoker    Packs/day: 0.30    Years: 10.00    Types: Cigarettes    Quit date: 03/25/1968  . 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 Alcohol use No    Family History  Problem Relation Age of Onset  . Arthritis Mother   . Hypertension Mother   . Heart disease Father   . Arthritis Father   . Hypertension Father   . Depression Sister   . Hyperlipidemia Sister   . Hypertension Other   . Hyperlipidemia Other       Review of Systems  Constitutional: Positive for malaise/fatigue.  Eyes: Negative.   Respiratory: Negative.   Cardiovascular: Negative.   Gastrointestinal: Positive for heartburn.  Genitourinary: Negative.   Musculoskeletal: Positive for joint pain.  Skin: Negative.   Neurological: Positive for headaches.  Endo/Heme/Allergies: Positive for environmental allergies.  Psychiatric/Behavioral: Positive for depression and memory loss. The patient is nervous/anxious.     Objective:  Physical Exam  Constitutional: She is oriented to person, place, and time. She appears well-developed.  HENT:  Head: Normocephalic.  Eyes: Pupils are equal, round, and reactive to light.  Neck: Neck supple. No JVD present. No tracheal deviation present. No thyromegaly present.  Cardiovascular: Normal rate, regular rhythm and intact distal pulses.   Respiratory: Effort normal and breath sounds normal. No respiratory distress. She has no wheezes.  GI: Soft. There is no tenderness. There is no guarding.  Musculoskeletal:       Right hip: She exhibits decreased range of motion, decreased strength, tenderness, bony tenderness and laceration (healed previous incision). She exhibits no swelling and no deformity.  Lymphadenopathy:    She has no cervical adenopathy.  Neurological: She is alert and  oriented to person, place, and time. A sensory deficit (occassional numbness right foot) is present.  Skin: Skin is warm and dry.  Psychiatric: She has a normal mood and affect.     Labs:  Estimated body mass index is 23.21 kg/m as calculated from the following:   Height as of 09/20/16: 5\' 4"  (1.626 m).   Weight as of 09/20/16: 61.3 kg (135 lb 3.2 oz).  Imaging Review:  Plain radiographs demonstrate severe degenerative joint disease of the right hip(s). The bone quality appears to be good for age and reported activity level.   Assessment/Plan:  Right hip with failed previous arthroplasty.  The patient history, physical examination, clinical judgement of the provider and imaging studies are consistent with failure do to instability of the right hip(s), previous total hip arthroplasty. Revision  total hip arthroplasty is deemed medically necessary. The treatment options including medical management, injection therapy, arthroscopy and arthroplasty were discussed at length. The risks and benefits of total hip arthroplasty were presented and reviewed. The risks due to aseptic loosening, infection, stiffness, dislocation/subluxation,  thromboembolic complications and other imponderables were discussed.  The patient acknowledged the explanation, agreed to proceed with the plan and consent was signed. Patient is being admitted for inpatient treatment for surgery, pain control, PT, OT, prophylactic antibiotics, VTE prophylaxis, progressive ambulation and ADL's and discharge planning. The patient is planning to be discharged home.    [place] Pugh Eldo Umanzor   PA-C  09/22/2016, 8:48 PM

## 2016-09-23 ENCOUNTER — Encounter (HOSPITAL_COMMUNITY): Payer: Self-pay

## 2016-09-24 ENCOUNTER — Inpatient Hospital Stay (HOSPITAL_COMMUNITY): Payer: Medicare Other | Admitting: Anesthesiology

## 2016-09-24 ENCOUNTER — Encounter (HOSPITAL_COMMUNITY): Payer: Self-pay | Admitting: *Deleted

## 2016-09-24 ENCOUNTER — Encounter (HOSPITAL_COMMUNITY)
Admission: RE | Disposition: A | Discharge status: Home / Self Care | Payer: Self-pay | Source: Ambulatory Visit | Attending: Orthopedic Surgery

## 2016-09-24 ENCOUNTER — Inpatient Hospital Stay (HOSPITAL_COMMUNITY)
Admission: RE | Admit: 2016-09-24 | Discharge: 2016-09-26 | DRG: 468 | Disposition: A | Discharge status: Home / Self Care | Payer: Medicare Other | Source: Ambulatory Visit | Attending: Orthopedic Surgery | Admitting: Orthopedic Surgery

## 2016-09-24 ENCOUNTER — Inpatient Hospital Stay (HOSPITAL_COMMUNITY): Payer: Medicare Other

## 2016-09-24 DIAGNOSIS — M1611 Unilateral primary osteoarthritis, right hip: Secondary | ICD-10-CM | POA: Diagnosis present

## 2016-09-24 DIAGNOSIS — T84020A Dislocation of internal right hip prosthesis, initial encounter: Secondary | ICD-10-CM | POA: Diagnosis not present

## 2016-09-24 DIAGNOSIS — Z96641 Presence of right artificial hip joint: Secondary | ICD-10-CM | POA: Diagnosis not present

## 2016-09-24 DIAGNOSIS — Y792 Prosthetic and other implants, materials and accessory orthopedic devices associated with adverse incidents: Secondary | ICD-10-CM | POA: Diagnosis present

## 2016-09-24 DIAGNOSIS — E785 Hyperlipidemia, unspecified: Secondary | ICD-10-CM | POA: Diagnosis not present

## 2016-09-24 DIAGNOSIS — T84060A Wear of articular bearing surface of internal prosthetic right hip joint, initial encounter: Secondary | ICD-10-CM | POA: Diagnosis not present

## 2016-09-24 DIAGNOSIS — Z96649 Presence of unspecified artificial hip joint: Secondary | ICD-10-CM

## 2016-09-24 DIAGNOSIS — Z8261 Family history of arthritis: Secondary | ICD-10-CM | POA: Diagnosis not present

## 2016-09-24 DIAGNOSIS — Z96661 Presence of right artificial ankle joint: Secondary | ICD-10-CM | POA: Diagnosis not present

## 2016-09-24 DIAGNOSIS — Z8249 Family history of ischemic heart disease and other diseases of the circulatory system: Secondary | ICD-10-CM

## 2016-09-24 DIAGNOSIS — Z8349 Family history of other endocrine, nutritional and metabolic diseases: Secondary | ICD-10-CM | POA: Diagnosis not present

## 2016-09-24 DIAGNOSIS — E118 Type 2 diabetes mellitus with unspecified complications: Secondary | ICD-10-CM | POA: Diagnosis present

## 2016-09-24 DIAGNOSIS — E7800 Pure hypercholesterolemia, unspecified: Secondary | ICD-10-CM | POA: Diagnosis not present

## 2016-09-24 DIAGNOSIS — F418 Other specified anxiety disorders: Secondary | ICD-10-CM | POA: Diagnosis present

## 2016-09-24 DIAGNOSIS — Z7984 Long term (current) use of oral hypoglycemic drugs: Secondary | ICD-10-CM | POA: Diagnosis not present

## 2016-09-24 DIAGNOSIS — K219 Gastro-esophageal reflux disease without esophagitis: Secondary | ICD-10-CM | POA: Diagnosis not present

## 2016-09-24 DIAGNOSIS — Z87891 Personal history of nicotine dependence: Secondary | ICD-10-CM | POA: Diagnosis not present

## 2016-09-24 DIAGNOSIS — Z79899 Other long term (current) drug therapy: Secondary | ICD-10-CM

## 2016-09-24 DIAGNOSIS — E119 Type 2 diabetes mellitus without complications: Secondary | ICD-10-CM | POA: Diagnosis not present

## 2016-09-24 DIAGNOSIS — Z96643 Presence of artificial hip joint, bilateral: Secondary | ICD-10-CM | POA: Diagnosis present

## 2016-09-24 DIAGNOSIS — Z471 Aftercare following joint replacement surgery: Secondary | ICD-10-CM | POA: Diagnosis not present

## 2016-09-24 DIAGNOSIS — I10 Essential (primary) hypertension: Secondary | ICD-10-CM | POA: Diagnosis not present

## 2016-09-24 DIAGNOSIS — T84030A Mechanical loosening of internal right hip prosthetic joint, initial encounter: Secondary | ICD-10-CM | POA: Diagnosis not present

## 2016-09-24 HISTORY — PX: ANTERIOR HIP REVISION: SHX6527

## 2016-09-24 HISTORY — DX: Presence of unspecified artificial hip joint: Z96.649

## 2016-09-24 LAB — TYPE AND SCREEN
ABO/RH(D): A POS
ANTIBODY SCREEN: NEGATIVE

## 2016-09-24 SURGERY — REVISION, TOTAL ARTHROPLASTY, HIP, ANTERIOR APPROACH
Anesthesia: Monitor Anesthesia Care | Site: Hip | Laterality: Right

## 2016-09-24 MED ORDER — HYDROCODONE-ACETAMINOPHEN 7.5-325 MG PO TABS
1.0000 | ORAL_TABLET | ORAL | Status: DC
  Administered 2016-09-24: 2 via ORAL
  Administered 2016-09-24 – 2016-09-25 (×5): 1 via ORAL
  Administered 2016-09-25: 2 via ORAL
  Administered 2016-09-25 – 2016-09-26 (×5): 1 via ORAL
  Filled 2016-09-24: qty 2
  Filled 2016-09-24: qty 1
  Filled 2016-09-24 (×2): qty 2
  Filled 2016-09-24: qty 1
  Filled 2016-09-24: qty 2
  Filled 2016-09-24: qty 1
  Filled 2016-09-24 (×2): qty 2
  Filled 2016-09-24: qty 1
  Filled 2016-09-24: qty 2
  Filled 2016-09-24: qty 1
  Filled 2016-09-24: qty 2

## 2016-09-24 MED ORDER — BISACODYL 10 MG RE SUPP: 10.0000 mg | Freq: Every day | RECTAL | Status: DC | PRN

## 2016-09-24 MED ORDER — MONTELUKAST SODIUM 10 MG PO TABS
10.0000 mg | ORAL_TABLET | Freq: Every day | ORAL | Status: DC
  Administered 2016-09-24 – 2016-09-25 (×2): 10 mg via ORAL
  Filled 2016-09-24 (×2): qty 1

## 2016-09-24 MED ORDER — AMLODIPINE BESYLATE 5 MG PO TABS
5.0000 mg | ORAL_TABLET | Freq: Every day | ORAL | Status: DC
  Administered 2016-09-26: 10:00:00 5 mg via ORAL
  Filled 2016-09-24 (×2): qty 1

## 2016-09-24 MED ORDER — CEFAZOLIN SODIUM-DEXTROSE 2-4 GM/100ML-% IV SOLN
INTRAVENOUS | Status: AC
  Filled 2016-09-24: qty 100

## 2016-09-24 MED ORDER — PROPOFOL 10 MG/ML IV BOLUS
INTRAVENOUS | Status: AC
  Filled 2016-09-24: qty 20

## 2016-09-24 MED ORDER — ONDANSETRON HCL 4 MG/2ML IJ SOLN: 4.0000 mg | Freq: Once | INTRAMUSCULAR | Status: DC | PRN

## 2016-09-24 MED ORDER — POLYETHYLENE GLYCOL 3350 17 G PO PACK: 17.0000 g | PACK | Freq: Two times a day (BID) | ORAL | 0 refills | Status: DC

## 2016-09-24 MED ORDER — POLYETHYLENE GLYCOL 3350 17 G PO PACK
17.0000 g | PACK | Freq: Two times a day (BID) | ORAL | Status: DC
  Administered 2016-09-24 – 2016-09-26 (×3): 17 g via ORAL
  Filled 2016-09-24 (×3): qty 1

## 2016-09-24 MED ORDER — SODIUM CHLORIDE 0.9 % IJ SOLN
INTRAMUSCULAR | Status: AC
Start: 2016-09-24 — End: ?
  Filled 2016-09-24: qty 10

## 2016-09-24 MED ORDER — 0.9 % SODIUM CHLORIDE (POUR BTL) OPTIME
TOPICAL | Status: DC | PRN
  Administered 2016-09-24: 1000 mL

## 2016-09-24 MED ORDER — ONDANSETRON HCL 4 MG PO TABS: 4.0000 mg | ORAL_TABLET | Freq: Four times a day (QID) | ORAL | Status: DC | PRN

## 2016-09-24 MED ORDER — PROPOFOL 500 MG/50ML IV EMUL
INTRAVENOUS | Status: DC | PRN
  Administered 2016-09-24: 20 mg via INTRAVENOUS

## 2016-09-24 MED ORDER — PANTOPRAZOLE SODIUM 40 MG PO TBEC
40.0000 mg | DELAYED_RELEASE_TABLET | Freq: Every day | ORAL | Status: DC
  Administered 2016-09-25 – 2016-09-26 (×2): 40 mg via ORAL
  Filled 2016-09-24 (×2): qty 1

## 2016-09-24 MED ORDER — LOSARTAN POTASSIUM 50 MG PO TABS
100.0000 mg | ORAL_TABLET | Freq: Every day | ORAL | Status: DC
  Filled 2016-09-24 (×3): qty 2

## 2016-09-24 MED ORDER — PHENYLEPHRINE HCL 10 MG/ML IJ SOLN
INTRAVENOUS | Status: DC | PRN
  Administered 2016-09-24: 20 ug/min via INTRAVENOUS

## 2016-09-24 MED ORDER — DIPHENHYDRAMINE HCL 25 MG PO CAPS
25.0000 mg | ORAL_CAPSULE | Freq: Four times a day (QID) | ORAL | Status: DC | PRN
  Administered 2016-09-25: 25 mg via ORAL
  Filled 2016-09-24: qty 1

## 2016-09-24 MED ORDER — CEFAZOLIN SODIUM-DEXTROSE 2-4 GM/100ML-% IV SOLN
2.0000 g | INTRAVENOUS | Status: AC
  Administered 2016-09-24: 2 g via INTRAVENOUS

## 2016-09-24 MED ORDER — ALUM & MAG HYDROXIDE-SIMETH 200-200-20 MG/5ML PO SUSP: 15.0000 mL | ORAL | Status: DC | PRN

## 2016-09-24 MED ORDER — DEXAMETHASONE SODIUM PHOSPHATE 10 MG/ML IJ SOLN
10.0000 mg | Freq: Once | INTRAMUSCULAR | Status: AC
  Administered 2016-09-25: 10 mg via INTRAVENOUS
  Filled 2016-09-24: qty 1

## 2016-09-24 MED ORDER — TRANEXAMIC ACID 1000 MG/10ML IV SOLN
1000.0000 mg | INTRAVENOUS | Status: AC
  Administered 2016-09-24: 1000 mg via INTRAVENOUS
  Filled 2016-09-24: qty 1100

## 2016-09-24 MED ORDER — TRANEXAMIC ACID 1000 MG/10ML IV SOLN
1000.0000 mg | Freq: Once | INTRAVENOUS | Status: AC
  Administered 2016-09-24: 14:00:00 1000 mg via INTRAVENOUS
  Filled 2016-09-24: qty 1100

## 2016-09-24 MED ORDER — DEXAMETHASONE SODIUM PHOSPHATE 10 MG/ML IJ SOLN
INTRAMUSCULAR | Status: AC
  Filled 2016-09-24: qty 1

## 2016-09-24 MED ORDER — ONDANSETRON HCL 4 MG/2ML IJ SOLN
INTRAMUSCULAR | Status: AC
  Filled 2016-09-24: qty 2

## 2016-09-24 MED ORDER — PHENOL 1.4 % MT LIQD: 1.0000 | OROMUCOSAL | Status: DC | PRN

## 2016-09-24 MED ORDER — METOCLOPRAMIDE HCL 5 MG/ML IJ SOLN: 5.0000 mg | Freq: Three times a day (TID) | INTRAMUSCULAR | Status: DC | PRN

## 2016-09-24 MED ORDER — MAGNESIUM CITRATE PO SOLN: 1.0000 | Freq: Once | ORAL | Status: DC | PRN

## 2016-09-24 MED ORDER — ESCITALOPRAM OXALATE 20 MG PO TABS
20.0000 mg | ORAL_TABLET | Freq: Every day | ORAL | Status: DC
  Administered 2016-09-25 – 2016-09-26 (×2): 20 mg via ORAL
  Filled 2016-09-24 (×2): qty 1

## 2016-09-24 MED ORDER — HYDROMORPHONE HCL 2 MG/ML IJ SOLN
INTRAMUSCULAR | Status: AC
  Filled 2016-09-24: qty 1

## 2016-09-24 MED ORDER — CELECOXIB 200 MG PO CAPS
200.0000 mg | ORAL_CAPSULE | Freq: Two times a day (BID) | ORAL | Status: DC
  Administered 2016-09-24 – 2016-09-26 (×4): 200 mg via ORAL
  Filled 2016-09-24 (×4): qty 1

## 2016-09-24 MED ORDER — METHOCARBAMOL 1000 MG/10ML IJ SOLN
500.0000 mg | Freq: Four times a day (QID) | INTRAVENOUS | Status: DC | PRN
  Administered 2016-09-24: 500 mg via INTRAVENOUS
  Filled 2016-09-24: qty 550

## 2016-09-24 MED ORDER — PHENYLEPHRINE HCL 10 MG/ML IJ SOLN
INTRAMUSCULAR | Status: DC | PRN
  Administered 2016-09-24 (×2): 40 ug via INTRAVENOUS
  Administered 2016-09-24: 80 ug via INTRAVENOUS

## 2016-09-24 MED ORDER — METOCLOPRAMIDE HCL 5 MG PO TABS: 5.0000 mg | ORAL_TABLET | Freq: Three times a day (TID) | ORAL | Status: DC | PRN

## 2016-09-24 MED ORDER — PHENYLEPHRINE HCL 10 MG/ML IJ SOLN
INTRAMUSCULAR | Status: AC
  Filled 2016-09-24: qty 1

## 2016-09-24 MED ORDER — PROPOFOL 500 MG/50ML IV EMUL
INTRAVENOUS | Status: DC | PRN
  Administered 2016-09-24: 75 ug/kg/min via INTRAVENOUS

## 2016-09-24 MED ORDER — ASPIRIN 81 MG PO CHEW: 81.0000 mg | CHEWABLE_TABLET | Freq: Two times a day (BID) | ORAL | 0 refills | Status: AC

## 2016-09-24 MED ORDER — ONDANSETRON HCL 4 MG/2ML IJ SOLN: 4.0000 mg | Freq: Four times a day (QID) | INTRAMUSCULAR | Status: DC | PRN

## 2016-09-24 MED ORDER — MENTHOL 3 MG MT LOZG: 1.0000 | LOZENGE | OROMUCOSAL | Status: DC | PRN

## 2016-09-24 MED ORDER — ASPIRIN 81 MG PO CHEW
81.0000 mg | CHEWABLE_TABLET | Freq: Two times a day (BID) | ORAL | Status: DC
  Administered 2016-09-24 – 2016-09-26 (×4): 81 mg via ORAL
  Filled 2016-09-24 (×4): qty 1

## 2016-09-24 MED ORDER — SODIUM CHLORIDE 0.9 % IV SOLN
INTRAVENOUS | Status: DC
Start: 2016-09-24 — End: 2016-09-26
  Administered 2016-09-24 (×2): via INTRAVENOUS

## 2016-09-24 MED ORDER — HYDROMORPHONE HCL 2 MG/ML IJ SOLN
0.5000 mg | INTRAMUSCULAR | Status: DC | PRN
  Administered 2016-09-24: 0.5 mg via INTRAVENOUS

## 2016-09-24 MED ORDER — DULOXETINE HCL 30 MG PO CPEP
30.0000 mg | ORAL_CAPSULE | Freq: Every day | ORAL | Status: DC
  Administered 2016-09-25 – 2016-09-26 (×2): 30 mg via ORAL
  Filled 2016-09-24 (×2): qty 1

## 2016-09-24 MED ORDER — METHOCARBAMOL 500 MG PO TABS
500.0000 mg | ORAL_TABLET | Freq: Four times a day (QID) | ORAL | Status: DC | PRN
  Administered 2016-09-25: 13:00:00 500 mg via ORAL
  Filled 2016-09-24: qty 1

## 2016-09-24 MED ORDER — FENTANYL CITRATE (PF) 100 MCG/2ML IJ SOLN
25.0000 ug | INTRAMUSCULAR | Status: DC | PRN
  Filled 2016-09-24: qty 2

## 2016-09-24 MED ORDER — STERILE WATER FOR IRRIGATION IR SOLN
Status: DC | PRN
Start: 2016-09-24 — End: 2016-09-24
  Administered 2016-09-24: 2000 mL

## 2016-09-24 MED ORDER — ONDANSETRON HCL 4 MG/2ML IJ SOLN
INTRAMUSCULAR | Status: DC | PRN
  Administered 2016-09-24: 4 mg via INTRAVENOUS

## 2016-09-24 MED ORDER — ALBUTEROL SULFATE HFA 108 (90 BASE) MCG/ACT IN AERS: 2.0000 | INHALATION_SPRAY | Freq: Four times a day (QID) | RESPIRATORY_TRACT | Status: DC | PRN

## 2016-09-24 MED ORDER — HYDROMORPHONE HCL 2 MG/ML IJ SOLN
0.3000 mg | INTRAMUSCULAR | Status: DC | PRN
  Administered 2016-09-24 (×4): 0.5 mg via INTRAVENOUS

## 2016-09-24 MED ORDER — ALBUTEROL SULFATE (2.5 MG/3ML) 0.083% IN NEBU: 2.5000 mg | INHALATION_SOLUTION | Freq: Four times a day (QID) | RESPIRATORY_TRACT | Status: DC | PRN

## 2016-09-24 MED ORDER — LACTATED RINGERS IV SOLN
INTRAVENOUS | Status: DC
  Administered 2016-09-24 (×2): via INTRAVENOUS

## 2016-09-24 MED ORDER — DIAZEPAM 2 MG PO TABS: 2.0000 mg | ORAL_TABLET | Freq: Two times a day (BID) | ORAL | Status: DC | PRN

## 2016-09-24 MED ORDER — DEXAMETHASONE SODIUM PHOSPHATE 10 MG/ML IJ SOLN
10.0000 mg | Freq: Once | INTRAMUSCULAR | Status: AC
  Administered 2016-09-24: 10 mg via INTRAVENOUS

## 2016-09-24 MED ORDER — MOMETASONE FURO-FORMOTEROL FUM 100-5 MCG/ACT IN AERO
2.0000 | INHALATION_SPRAY | Freq: Two times a day (BID) | RESPIRATORY_TRACT | Status: DC
  Administered 2016-09-24 – 2016-09-26 (×4): 2 via RESPIRATORY_TRACT
  Filled 2016-09-24: qty 8.8

## 2016-09-24 MED ORDER — CHLORHEXIDINE GLUCONATE 4 % EX LIQD: 60.0000 mL | Freq: Once | CUTANEOUS | Status: DC

## 2016-09-24 MED ORDER — FERROUS SULFATE 325 (65 FE) MG PO TABS
325.0000 mg | ORAL_TABLET | Freq: Three times a day (TID) | ORAL | Status: DC
  Administered 2016-09-25 – 2016-09-26 (×4): 325 mg via ORAL
  Filled 2016-09-24 (×4): qty 1

## 2016-09-24 MED ORDER — DOCUSATE SODIUM 100 MG PO CAPS
100.0000 mg | ORAL_CAPSULE | Freq: Two times a day (BID) | ORAL | Status: DC
  Administered 2016-09-24 – 2016-09-26 (×4): 100 mg via ORAL
  Filled 2016-09-24 (×4): qty 1

## 2016-09-24 MED ORDER — HYDROCODONE-ACETAMINOPHEN 7.5-325 MG PO TABS: 1.0000 | ORAL_TABLET | ORAL | 0 refills | Status: DC | PRN

## 2016-09-24 MED ORDER — LORATADINE 10 MG PO TABS
10.0000 mg | ORAL_TABLET | Freq: Every day | ORAL | Status: DC
  Administered 2016-09-25 – 2016-09-26 (×2): 10 mg via ORAL
  Filled 2016-09-24 (×2): qty 1

## 2016-09-24 MED ORDER — METHOCARBAMOL 500 MG PO TABS: 500.0000 mg | ORAL_TABLET | Freq: Four times a day (QID) | ORAL | 0 refills | Status: DC | PRN

## 2016-09-24 MED ORDER — CEFAZOLIN SODIUM-DEXTROSE 2-4 GM/100ML-% IV SOLN
2.0000 g | Freq: Four times a day (QID) | INTRAVENOUS | Status: AC
  Administered 2016-09-24 (×2): 2 g via INTRAVENOUS
  Filled 2016-09-24 (×2): qty 100

## 2016-09-24 MED ORDER — PROPOFOL 10 MG/ML IV BOLUS
INTRAVENOUS | Status: AC
  Filled 2016-09-24: qty 40

## 2016-09-24 MED ORDER — CHLORTHALIDONE 25 MG PO TABS
12.5000 mg | ORAL_TABLET | Freq: Every day | ORAL | Status: DC
  Administered 2016-09-26: 12.5 mg via ORAL
  Filled 2016-09-24 (×3): qty 1

## 2016-09-24 MED ORDER — DOCUSATE SODIUM 100 MG PO CAPS: 100.0000 mg | ORAL_CAPSULE | Freq: Two times a day (BID) | ORAL | 0 refills | Status: DC

## 2016-09-24 MED ORDER — FLUTICASONE PROPIONATE 50 MCG/ACT NA SUSP
2.0000 | Freq: Every day | NASAL | Status: DC
  Filled 2016-09-24: qty 16

## 2016-09-24 MED ORDER — BUPIVACAINE IN DEXTROSE 0.75-8.25 % IT SOLN
INTRATHECAL | Status: DC | PRN
  Administered 2016-09-24: 1.8 mL via INTRATHECAL

## 2016-09-24 MED ORDER — MEPERIDINE HCL 50 MG/ML IJ SOLN: 6.2500 mg | INTRAMUSCULAR | Status: DC | PRN

## 2016-09-24 MED ORDER — FERROUS SULFATE 325 (65 FE) MG PO TABS: 325.0000 mg | ORAL_TABLET | Freq: Three times a day (TID) | ORAL | Status: DC

## 2016-09-24 SURGICAL SUPPLY — 62 items
ADH SKN CLS APL DERMABOND .7 (GAUZE/BANDAGES/DRESSINGS) ×1
BAG SPEC THK2 15X12 ZIP CLS (MISCELLANEOUS) ×1
BAG ZIPLOCK 12X15 (MISCELLANEOUS) ×3 IMPLANT
COVER SURGICAL LIGHT HANDLE (MISCELLANEOUS) ×3 IMPLANT
DERMABOND ADVANCED (GAUZE/BANDAGES/DRESSINGS) ×2
DERMABOND ADVANCED .7 DNX12 (GAUZE/BANDAGES/DRESSINGS) ×1 IMPLANT
DRAPE INCISE IOBAN 66X45 STRL (DRAPES) ×2 IMPLANT
DRAPE INCISE IOBAN 85X60 (DRAPES) ×2 IMPLANT
DRAPE ORTHO SPLIT 77X108 STRL (DRAPES) ×6
DRAPE POUCH INSTRU U-SHP 10X18 (DRAPES) ×3 IMPLANT
DRAPE SURG 17X11 SM STRL (DRAPES) ×3 IMPLANT
DRAPE SURG ORHT 6 SPLT 77X108 (DRAPES) ×2 IMPLANT
DRAPE U-SHAPE 47X51 STRL (DRAPES) ×3 IMPLANT
DRESSING AQUACEL AG SP 3.5X10 (GAUZE/BANDAGES/DRESSINGS) IMPLANT
DRSG AQUACEL AG ADV 3.5X10 (GAUZE/BANDAGES/DRESSINGS) IMPLANT
DRSG AQUACEL AG ADV 3.5X14 (GAUZE/BANDAGES/DRESSINGS) IMPLANT
DRSG AQUACEL AG SP 3.5X10 (GAUZE/BANDAGES/DRESSINGS) ×3
DURAPREP 26ML APPLICATOR (WOUND CARE) ×3 IMPLANT
ELECT BLADE TIP CTD 4 INCH (ELECTRODE) ×3 IMPLANT
ELECT REM PT RETURN 15FT ADLT (MISCELLANEOUS) ×3 IMPLANT
FACESHIELD WRAPAROUND (MASK) ×12 IMPLANT
FACESHIELD WRAPAROUND OR TEAM (MASK) ×4 IMPLANT
GAUZE SPONGE 2X2 8PLY STRL LF (GAUZE/BANDAGES/DRESSINGS) ×1 IMPLANT
GLOVE BIOGEL M 7.0 STRL (GLOVE) ×2 IMPLANT
GLOVE BIOGEL M STRL SZ7.5 (GLOVE) ×2 IMPLANT
GLOVE BIOGEL PI IND STRL 7.5 (GLOVE) ×1 IMPLANT
GLOVE BIOGEL PI IND STRL 8.5 (GLOVE) ×1 IMPLANT
GLOVE BIOGEL PI INDICATOR 7.5 (GLOVE) ×12
GLOVE BIOGEL PI INDICATOR 8.5 (GLOVE) ×2
GLOVE ECLIPSE 6.5 STRL STRAW (GLOVE) ×2 IMPLANT
GLOVE INDICATOR 6.5 STRL GRN (GLOVE) ×2 IMPLANT
GLOVE SURG SS PI 7.5 STRL IVOR (GLOVE) ×2 IMPLANT
GOWN STRL REUS W/TWL LRG LVL3 (GOWN DISPOSABLE) ×3 IMPLANT
GOWN STRL REUS W/TWL XL LVL3 (GOWN DISPOSABLE) ×6 IMPLANT
GRAFT IC CHAMBER LG (Bone Implant) ×2 IMPLANT
HEAD OSTEO CTAPER L FIT (Orthopedic Implant) ×3 IMPLANT
HEAD OSTEO CTAPER L FIT 28 +10 (Orthopedic Implant) ×1 IMPLANT
INSERT X3 ADM/MDM 28MM 28/48 (Orthopedic Implant) ×3 IMPLANT
LIFENET VIRGINIA TISSUE BANK GRAFT IC CHAMBER LG (Tissue) ×2 IMPLANT
LINER 42MM E (Orthopedic Implant) ×3 IMPLANT
MANIFOLD NEPTUNE II (INSTRUMENTS) ×3 IMPLANT
MARKER SKIN DUAL TIP RULER LAB (MISCELLANEOUS) ×3 IMPLANT
NDL SAFETY ECLIPSE 18X1.5 (NEEDLE) ×1 IMPLANT
NEEDLE HYPO 18GX1.5 SHARP (NEEDLE) ×3
PADDING CAST COTTON 6X4 STRL (CAST SUPPLIES) ×3 IMPLANT
POSITIONER SURGICAL ARM (MISCELLANEOUS) ×3 IMPLANT
SCREW GAP PLATE REST 6.5X25MM (Screw) ×4 IMPLANT
SCREW GAP PLATE REST 6.5X35MM (Screw) ×2 IMPLANT
SHELL TRIDENT TRITAN HEMIS SZE (Orthopedic Implant) ×2 IMPLANT
SPONGE GAUZE 2X2 STER 10/PKG (GAUZE/BANDAGES/DRESSINGS) ×2
SPONGE LAP 18X18 X RAY DECT (DISPOSABLE) ×3 IMPLANT
SPONGE LAP 4X18 X RAY DECT (DISPOSABLE) ×3 IMPLANT
STAPLER VISISTAT 35W (STAPLE) ×3 IMPLANT
SUCTION FRAZIER HANDLE 10FR (MISCELLANEOUS) ×2
SUCTION TUBE FRAZIER 10FR DISP (MISCELLANEOUS) ×1 IMPLANT
SUT VIC AB 1 CT1 36 (SUTURE) ×3 IMPLANT
SUT VIC AB 2-0 CT1 27 (SUTURE) ×12
SUT VIC AB 2-0 CT1 TAPERPNT 27 (SUTURE) ×3 IMPLANT
SUT VLOC 180 0 24IN GS25 (SUTURE) ×6 IMPLANT
TOWEL OR 17X26 10 PK STRL BLUE (TOWEL DISPOSABLE) ×6 IMPLANT
TRAY FOLEY CATH SILVER 14FR (SET/KITS/TRAYS/PACK) ×2 IMPLANT
YANKAUER SUCT BULB TIP 10FT TU (MISCELLANEOUS) ×3 IMPLANT

## 2016-09-24 NOTE — Op Note (Signed)
[name], [name]              ACCOUNT NO.:  [id number]  MEDICAL RECORD NO.:  [id number]  LOCATION:                                 FACILITY:  PHYSICIAN:  Pietro Cassis. Alvan Dame, M.D.       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  PROCEDURE:  Revision of right total hip arthroplasty.  COMPONENTS USED:  Stryker revision hip system MDM.  A size 54 E Trident Tritanium hemispherical revision shell.  We used a size 42 mm E MDM liner.  This was then mated with a new head ball system for the MDM system with a 28 x 42 E polyethylene ball and a 28+ 10 inner metal ball.  SURGEON:  Pietro Cassis. Alvan Dame, M.D.  ASSISTANT:  Sonya Massed, PA-C.  Note that [name] was present for the entirety of the case from preoperative position, perioperative management of the operative extremity, general facilitation of the case, and primary wound closure.  ANESTHESIA:  Spinal.  SPECIMENS:  None.  COMPLICATION:  None.  BLOOD LOSS:  250 mL.  DRAINS:  None.  INDICATION FOR PROCEDURE:  [name] is an 83 year old female with a history of index right total hip arthroplasty in 1999.  She began recently having some pain and this was associated with episodes of instability after his second dislocation in a month.  She was seen and evaluated in the office on referral basis.  She explained to me at the time, that she was not interested in that occurring again and  would like for the hip to be revised.  Reviewed the risk of infection, DVT, component failure, further instability issues, neurovascular injury. She wished to proceed for benefit of stability of hip and pain control. Consent was obtained.  PROCEDURE IN DETAIL:  The patient was brought to the operative  theater. Once adequate anesthesia, preoperative antibiotics, Ancef 2 g as well as 1 g of tranexamic acid and 10 mg of Decadron, she was positioned into the left lateral decubitus position with the right hip.  The right lower extremity was then prepped and draped in sterile fashion.  Time-out was performed identifying the patient, planned procedure, and extremity. Her old incision had been identified and scar was excised.  Soft tissue planes created to the iliotibial band and gluteal fascia.  This was then incised for posterior approach to the hip.  The posterior aspect of the hip was exposed.  There was a blood-tinged synovial fluid due to the trauma.  No signs of infection.  As I exposed the posterior aspect of the hip, we identified the head and the hip joint itself.  Once I had it exposed the superior and inferior, I dislocated the hip very easily.  The femoral head was removed.  At this point, I placed a trunnion on the ilium and placed retractors around the acetabulum.  The old acetabular liner was removed using a drill and screw technique.  I then used the Stryker cup explant system  and removed the acetabular shell with minimal issues.  This was a 50 mm shell.  The examination of her acetabular bone stock reveals some evidence of osteolytic pockets into the ilium posteriorly, inferior and superior as well as the anterior inferior along the pubis.  The anterior wall was relatively thin.  I reamed carefully with a 50 mm, then 52 and 53 in reverse.  When I used a 53 reamer, I had packed 15 mL of bone graft into these defects and reverse reamed to smooth out the acetabulum.  I selected a 54 Trident Tritanium hemispherical shell.  The shell was then impacted.  I changed the orientation of her acetabulum abducting to 35-40 degrees and forward flexion about 20-25 degrees.  Two screws were placed into the ilium providing assistance with initial fixation.  At this point, I went ahead  and placed per my plan the MDM liner to match this acetabular shell.  I did trial reduction at this point, initially the 28+ 5 ball because when I took out before, but then trialed again with a 28+ 10 ball with the 28 x 42 outer ball.  With this, I found the hip to be very stable without evidence of any impingement subluxation.  Given these findings and the goal of providing stability for her as well as a new oriented cup, we selected these final components.  The final 28+ 10 metal ball was opened.  It was then compressed into the outer ball on the back table.  These 2 were noted to be freely moving on one other and they were then impacted on a clean and dried trunnion.  The hip was reduced.  We irrigated the hip throughout the case again at this point.  There was no capsular tissue to reapproximate posterior based on exposure.  The iliotibial band and gluteal fascia were reapproximated using #1 Vicryl and 0 V-Loc suture. The remainder of the wound was closed with 2-0 Vicryl and running 4-0 Monocryl.  The hip was then cleaned, dried, and dressed sterilely using surgical glue and Aquacel dressing.  She was brought to the recovery room in stable condition, tolerating the procedure well.  Postoperatively, I will have her be partial weightbearing for 4-6 weeks to allow for bony ingrowth of the acetabulum.  Posterior precautions will be followed given recent dislocations in this posterior surgery.     Pietro Cassis Alvan Dame, M.D.     MDO/MEDQ  D:  09/24/2016  T:  09/24/2016  Job:  [id number]

## 2016-09-24 NOTE — Anesthesia Postprocedure Evaluation (Signed)
Anesthesia Post Note  Patient: [name]  Procedure(s) Performed: Procedure(s) (LRB): RIGHT HIP ACETABULUM AND FEMORAL HEAD REVISION WITH BONE GRAFT (Right)     Patient location during evaluation: PACU Anesthesia Type: Spinal Level of consciousness: oriented and awake and alert Pain management: pain level controlled Vital Signs Assessment: post-procedure vital signs reviewed and stable Respiratory status: spontaneous breathing, respiratory function stable and patient connected to nasal cannula oxygen Cardiovascular status: blood pressure returned to baseline and stable Postop Assessment: no headache and no backache Anesthetic complications: no    Last Vitals:  Vitals:   09/24/16 1428 09/24/16 1518  BP: (!) 114/55 (!) 116/53  Pulse: 91 81  Resp: 15 16  Temp: 36.7 C 36.6 C    Last Pain:  Vitals:   09/24/16 1556  TempSrc:   PainSc: 4                  Bennie Scaff DAVID

## 2016-09-24 NOTE — Discharge Instructions (Signed)
INSTRUCTIONS AFTER JOINT REPLACEMENT   o Remove items at home which could result in a fall. This includes throw rugs or furniture in walking pathways o ICE to the affected joint every three hours while awake for 30 minutes at a time, for at least the first 3-5 days, and then as needed for pain and swelling.  Continue to use ice for pain and swelling. You may notice swelling that will progress down to the foot and ankle.  This is normal after surgery.  Elevate your leg when you are not up walking on it.   o Continue to use the breathing machine you got in the hospital (incentive spirometer) which will help keep your temperature down.  It is common for your temperature to cycle up and down following surgery, especially at night when you are not up moving around and exerting yourself.  The breathing machine keeps your lungs expanded and your temperature down.   DIET:  As you were doing prior to hospitalization, we recommend a well-balanced diet.  DRESSING / WOUND CARE / SHOWERING  Keep the surgical dressing until follow up.  The dressing is water proof, so you can shower without any extra covering.  IF THE DRESSING FALLS OFF or the wound gets wet inside, change the dressing with sterile gauze.  Please use good hand washing techniques before changing the dressing.  Do not use any lotions or creams on the incision until instructed by your surgeon.    ACTIVITY  o Increase activity slowly as tolerated, but follow the weight bearing instructions below.   o No driving for 6 weeks or until further direction given by your physician.  You cannot drive while taking narcotics.  o No lifting or carrying greater than 10 lbs. until further directed by your surgeon. o Avoid periods of inactivity such as sitting longer than an hour when not asleep. This helps prevent blood clots.  o You may return to work once you are authorized by your doctor.     WEIGHT BEARING   Partial weight bearing with assist device as  directed.  50% right leg   EXERCISES  Results after joint replacement surgery are often greatly improved when you follow the exercise, range of motion and muscle strengthening exercises prescribed by your doctor. Safety measures are also important to protect the joint from further injury. Any time any of these exercises cause you to have increased pain or swelling, decrease what you are doing until you are comfortable again and then slowly increase them. If you have problems or questions, call your caregiver or physical therapist for advice.   Rehabilitation is important following a joint replacement. After just a few days of immobilization, the muscles of the leg can become weakened and shrink (atrophy).  These exercises are designed to build up the tone and strength of the thigh and leg muscles and to improve motion. Often times heat used for twenty to thirty minutes before working out will loosen up your tissues and help with improving the range of motion but do not use heat for the first two weeks following surgery (sometimes heat can increase post-operative swelling).   These exercises can be done on a training (exercise) mat, on the floor, on a table or on a bed. Use whatever works the best and is most comfortable for you.    Use music or television while you are exercising so that the exercises are a pleasant break in your day. This will make your life better with   the exercises acting as a break in your routine that you can look forward to.   Perform all exercises about fifteen times, three times per day or as directed.  You should exercise both the operative leg and the other leg as well.  Exercises include:   . Quad Sets - Tighten up the muscle on the front of the thigh (Quad) and hold for 5-10 seconds.   . Straight Leg Raises - With your knee straight (if you were given a brace, keep it on), lift the leg to 60 degrees, hold for 3 seconds, and slowly lower the leg.  Perform this exercise  against resistance later as your leg gets stronger.  . Leg Slides: Lying on your back, slowly slide your foot toward your buttocks, bending your knee up off the floor (only go as far as is comfortable). Then slowly slide your foot back down until your leg is flat on the floor again.  . Angel Wings: Lying on your back spread your legs to the side as far apart as you can without causing discomfort.  . Hamstring Strength:  Lying on your back, push your heel against the floor with your leg straight by tightening up the muscles of your buttocks.  Repeat, but this time bend your knee to a comfortable angle, and push your heel against the floor.  You may put a pillow under the heel to make it more comfortable if necessary.   A rehabilitation program following joint replacement surgery can speed recovery and prevent re-injury in the future due to weakened muscles. Contact your doctor or a physical therapist for more information on knee rehabilitation.    CONSTIPATION  Constipation is defined medically as fewer than three stools per week and severe constipation as less than one stool per week.  Even if you have a regular bowel pattern at home, your normal regimen is likely to be disrupted due to multiple reasons following surgery.  Combination of anesthesia, postoperative narcotics, change in appetite and fluid intake all can affect your bowels.   YOU MUST use at least one of the following options; they are listed in order of increasing strength to get the job done.  They are all available over the counter, and you may need to use some, POSSIBLY even all of these options:    Drink plenty of fluids (prune juice may be helpful) and high fiber foods Colace 100 mg by mouth twice a day  Senokot for constipation as directed and as needed Dulcolax (bisacodyl), take with full glass of water  Miralax (polyethylene glycol) once or twice a day as needed.  If you have tried all these things and are unable to have a  bowel movement in the first 3-4 days after surgery call either your surgeon or your primary doctor.    If you experience loose stools or diarrhea, hold the medications until you stool forms back up.  If your symptoms do not get better within 1 week or if they get worse, check with your doctor.  If you experience "the worst abdominal pain ever" or develop nausea or vomiting, please contact the office immediately for further recommendations for treatment.   ITCHING:  If you experience itching with your medications, try taking only a single pain pill, or even half a pain pill at a time.  You can also use Benadryl over the counter for itching or also to help with sleep.   TED HOSE STOCKINGS:  Use stockings on both legs until for   at least 2 weeks or as directed by physician office. They may be removed at night for sleeping.  MEDICATIONS:  See your medication summary on the "After Visit Summary" that nursing will review with you.  You may have some home medications which will be placed on hold until you complete the course of blood thinner medication.  It is important for you to complete the blood thinner medication as prescribed.  PRECAUTIONS:  If you experience chest pain or shortness of breath - call 911 immediately for transfer to the hospital emergency department.   If you develop a fever greater that 101 F, purulent drainage from wound, increased redness or drainage from wound, foul odor from the wound/dressing, or calf pain - CONTACT YOUR SURGEON.                                                   FOLLOW-UP APPOINTMENTS:  If you do not already have a post-op appointment, please call the office for an appointment to be seen by your surgeon.  Guidelines for how soon to be seen are listed in your "After Visit Summary", but are typically between 1-4 weeks after surgery.  OTHER INSTRUCTIONS:   Knee Replacement:  Do not place pillow under knee, focus on keeping the knee straight while resting.   MAKE  SURE YOU:  . Understand these instructions.  . Get help right away if you are not doing well or get worse.    Thank you for letting us be a part of your medical care team.  It is a privilege we respect greatly.  We hope these instructions will help you stay on track for a fast and full recovery!    

## 2016-09-24 NOTE — Interval H&P Note (Signed)
History and Physical Interval Note:  09/24/2016 6:47 AM  [name]  has presented today for surgery, with the diagnosis of Right total hip instability  The various methods of treatment have been discussed with the patient and family. After consideration of risks, benefits and other options for treatment, the patient has consented to  Procedure(s) with comments: Revision Right total hip arthroplasty, acetabular versus constrianed liner (Right) - 90 as a surgical intervention .  The patient's history has been reviewed, patient examined, no change in status, stable for surgery.  I have reviewed the patient's chart and labs.  Questions were answered to the patient's satisfaction.     Mauri Pole

## 2016-09-24 NOTE — Brief Op Note (Signed)
09/24/2016  10:12 AM  PATIENT:  [name]  83 y.o. female  PRE-OPERATIVE DIAGNOSIS:  Failed right total hip replacement due to instability  POST-OPERATIVE DIAGNOSIS:  Failed right total hip replacement due to instability  PROCEDURE:  Procedure(s): RIGHT HIP ACETABULUM AND FEMORAL HEAD REVISION WITH BONE GRAFT (Right)  SURGEON:  Surgeon(s) and Role:    Paralee Cancel, MD - Primary  PHYSICIAN ASSISTANT: Nehemiah Massed, PA-C  ANESTHESIA:   spinal  EBL:  Total I/O In: 1000 [I.V.:1000] Out: 750 [Urine:500; Blood:250]  BLOOD ADMINISTERED:none  DRAINS: none   LOCAL MEDICATIONS USED:  NONE  SPECIMEN:  No Specimen  DISPOSITION OF SPECIMEN:  N/A  COUNTS:  YES  TOURNIQUET:  * No tourniquets in log *  DICTATION: .Other Dictation: Dictation Number [id number]  PLAN OF CARE: Admit to inpatient   PATIENT DISPOSITION:  PACU - hemodynamically stable.   Delay start of Pharmacological VTE agent (>24hrs) due to surgical blood loss or risk of bleeding: no

## 2016-09-24 NOTE — Progress Notes (Signed)
Dr. Conrad Sterling  Notified of patient's condition-pain scale-nodding off - O.K. To go to floor

## 2016-09-24 NOTE — Anesthesia Procedure Notes (Signed)
Spinal  Patient location during procedure: OR Start time: 09/24/2016 8:37 AM End time: 09/24/2016 8:43 AM Staffing Anesthesiologist: Lillia Abed Resident/CRNA: Glory Buff Performed: resident/CRNA  Preanesthetic Checklist Completed: patient identified, site marked, surgical consent, pre-op evaluation, timeout performed, IV checked, risks and benefits discussed and monitors and equipment checked Spinal Block Patient position: sitting Prep: DuraPrep Patient monitoring: heart rate, continuous pulse ox and blood pressure Approach: midline Location: L2-3 Injection technique: single-shot Needle Needle type: Quincke  Needle gauge: 22 G Needle length: 9 cm Needle insertion depth: 5 cm Assessment Sensory level: T6 Additional Notes Kit date checked and verified, -heme,-paraesthesia, +CSF pre and post injection, patient tolerated well.

## 2016-09-24 NOTE — Progress Notes (Signed)
Physical Therapy Treatment Patient Details Name: [name] MRN: [id number] DOB: 27-Nov-1933 Today's Date: 09/24/2016    History of Present Illness revision RTHA- posterior    PT Comments    PROGRESSING. FREQUENT CUES FOR POSTERIOR HIP PRECAUTIONS.   Follow Up Recommendations  Home health PT;Supervision/Assistance - 24 hour     Equipment Recommendations  Rolling walker with 5" wheels    Recommendations for Other Services       Precautions / Restrictions Precautions Precautions: Fall;Posterior Hip Restrictions Weight Bearing Restrictions: Yes RLE Weight Bearing: Partial weight bearing RLE Partial Weight Bearing Percentage or Pounds: 50    Mobility  Bed Mobility Overal bed mobility: Needs Assistance Bed Mobility: Sit to Supine     Supine to sit: Min guard Sit to supine: Min assist   General bed mobility comments: CUES FOR TECHNIQUE  Transfers Overall transfer level: Needs assistance Equipment used: Rolling walker (2 wheeled) Transfers: Sit to/from [facility] Sit to Stand: Min assist Stand pivot transfers: Min assist       General transfer comment: cues for right leg position and hands.       General Gait Details: cues for 50% WB, sequencing   Stairs            Wheelchair Mobility    Modified Rankin (Stroke Patients Only)       Balance                                            Cognition Arousal/Alertness: Awake/alert Behavior During Therapy: WFL for tasks assessed/performed Overall Cognitive Status: Within Functional Limits for tasks assessed                                        Exercises      General Comments        Pertinent Vitals/Pain Pain Assessment: 0-10 Pain Score: 2  Pain Location: right hip Pain Descriptors / Indicators: Sore;Guarding Pain Intervention(s): Premedicated before session;Ice applied    Home Living Family/patient expects to be discharged to::  Private residence Living Arrangements: Alone Available Help at Discharge: Family;Personal care attendant;Available PRN/intermittently Type of Home: House Home Access: Stairs to enter Entrance Stairs-Rails: None Home Layout: Two level Home Equipment: Cane - single point Additional Comments: needs new RW    Prior Function Level of Independence: Independent          PT Goals (current goals can now be found in the care plan section) Acute Rehab PT Goals Patient Stated Goal: to go home PT Goal Formulation: With patient/family Time For Goal Achievement: 09/27/16 Potential to Achieve Goals: Good Progress towards PT goals: Progressing toward goals    Frequency    7X/week      PT Plan Current plan remains appropriate    Co-evaluation              AM-PAC PT "6 Clicks" Daily Activity  Outcome Measure  Difficulty turning over in bed (including adjusting bedclothes, sheets and blankets)?: Total Difficulty moving from lying on back to sitting on the side of the bed? : Total Difficulty sitting down on and standing up from a chair with arms (e.g., wheelchair, bedside commode, etc,.)?: Total Help needed moving to and from a bed to chair (including a wheelchair)?: Total Help needed walking in hospital  room?: Total Help needed climbing 3-5 steps with a railing? : Total 6 Click Score: 6    End of Session   Activity Tolerance: Patient tolerated treatment well Patient left: in bed;with call bell/phone within reach;with bed alarm set Nurse Communication: Mobility status PT Visit Diagnosis: Difficulty in walking, not elsewhere classified (R26.2);Pain Pain - Right/Left: Right Pain - part of body: Hip     Time: 1645-1700 PT Time Calculation (min) (ACUTE ONLY): 15 min  Charges:  $Therapeutic Activity: 8-22 mins                    G CodesTresa Endo PT [phone]    [name] 09/24/2016, 6:20 PM

## 2016-09-24 NOTE — Evaluation (Addendum)
Physical Therapy Evaluation Patient Details Name: [name] MRN: [id number] DOB: Dec 12, 1933 Today's Date: 09/24/2016   History of Present Illness  revision RTHA- posterior  Clinical Impression  The patient is mobilizing well. Patient reports that she may be home alone at times. Is planning to hire caregiver a few hours. Daughter will be available. Patient has 3 steps to get to bedroom and bath. Pt admitted with above diagnosis. Pt currently with functional limitations due to the deficits listed below (see PT Problem List). Pt will benefit from skilled PT to increase their independence and safety with mobility to allow discharge to the venue listed beloW. RECOMMEND HHPT.     Follow Up Recommendations Home health PT;Supervision/Assistance - 24 hour (IS STRONGLY RECOMMENDED)    Equipment Recommendations  Rolling walker with 5" wheels    Recommendations for Other Services       Precautions / Restrictions Precautions Precautions: Fall;Posterior Hip Restrictions Weight Bearing Restrictions: Yes RLE Weight Bearing: Partial weight bearing RLE Partial Weight Bearing Percentage or Pounds: 50      Mobility  Bed Mobility Overal bed mobility: Needs Assistance Bed Mobility: Supine to Sit     Supine to sit: Min guard     General bed mobility comments: patient self assisted right leg and trunk  Transfers Overall transfer level: Needs assistance Equipment used: Rolling walker (2 wheeled) Transfers: Sit to/from Stand Sit to Stand: Min assist         General transfer comment: cues for right leg position and hands.  Ambulation/Gait Ambulation/Gait assistance: Min assist Ambulation Distance (Feet): 10 Feet Assistive device: Rolling walker (2 wheeled) Gait Pattern/deviations: Step-to pattern     General Gait Details: cues for 50% WB, sequencing  Stairs            Wheelchair Mobility    Modified Rankin (Stroke Patients Only)       Balance                                             Pertinent Vitals/Pain Pain Assessment: 0-10 Pain Score: 4  Pain Location: right hip Pain Descriptors / Indicators: Sore;Guarding Pain Intervention(s): Limited activity within patient's tolerance;Monitored during session;Premedicated before session;Ice applied    Home Living Family/patient expects to be discharged to:: Private residence Living Arrangements: Alone Available Help at Discharge: Family;Personal care attendant;Available PRN/intermittently Type of Home: House Home Access: Stairs to enter Entrance Stairs-Rails: None Entrance Stairs-Number of Steps: 1 Home Layout: Two level Home Equipment: Cane - single point Additional Comments: needs new RW    Prior Function Level of Independence: Independent               Hand Dominance        Extremity/Trunk Assessment   Upper Extremity Assessment Upper Extremity Assessment: Defer to OT evaluation    Lower Extremity Assessment Lower Extremity Assessment: RLE deficits/detail RLE Deficits / Details: advances the leg    Cervical / Trunk Assessment Cervical / Trunk Assessment: Kyphotic  Communication   Communication: No difficulties  Cognition Arousal/Alertness: Awake/alert Behavior During Therapy: WFL for tasks assessed/performed Overall Cognitive Status: Within Functional Limits for tasks assessed                                        General Comments  Exercises     Assessment/Plan    PT Assessment Patient needs continued PT services  PT Problem List Decreased strength;Decreased range of motion;Decreased balance;Decreased activity tolerance;Decreased mobility;Decreased knowledge of precautions;Decreased safety awareness;Decreased knowledge of use of DME;Pain       PT Treatment Interventions DME instruction;Gait training;Stair training;Functional mobility training;Therapeutic activities;Therapeutic exercise;Patient/family education    PT Goals  (Current goals can be found in the Care Plan section)  Acute Rehab PT Goals Patient Stated Goal: to go home PT Goal Formulation: With patient/family Time For Goal Achievement: 09/27/16 Potential to Achieve Goals: Good    Frequency 7X/week   Barriers to discharge Decreased caregiver support      Co-evaluation               AM-PAC PT "6 Clicks" Daily Activity  Outcome Measure Difficulty turning over in bed (including adjusting bedclothes, sheets and blankets)?: Total Difficulty moving from lying on back to sitting on the side of the bed? : Total Difficulty sitting down on and standing up from a chair with arms (e.g., wheelchair, bedside commode, etc,.)?: Total Help needed moving to and from a bed to chair (including a wheelchair)?: A Lot Help needed walking in hospital room?: A Lot Help needed climbing 3-5 steps with a railing? : A Lot 6 Click Score: 9    End of Session   Activity Tolerance: Patient tolerated treatment well Patient left: in chair;with call bell/phone within reach;with chair alarm set;with family/visitor present Nurse Communication: Mobility status PT Visit Diagnosis: Difficulty in walking, not elsewhere classified (R26.2);Pain Pain - Right/Left: Right Pain - part of body: Hip    Time: 1594-5859 PT Time Calculation (min) (ACUTE ONLY): 16 min   Charges:   PT Evaluation $PT Eval Low Complexity: 1 Procedure     PT G CodesTresa Endo PT [phone]   [name] 09/24/2016, 6:13 PM

## 2016-09-24 NOTE — Anesthesia Preprocedure Evaluation (Addendum)
Anesthesia Evaluation  Patient identified by MRN, date of birth, ID band Patient awake    Reviewed: Allergy & Precautions, NPO status , Patient's Chart, lab work & pertinent test results  Airway Mallampati: I  TM Distance: >3 FB Neck ROM: Full    Dental   Pulmonary former smoker,    Pulmonary exam normal        Cardiovascular hypertension, Pt. on medications Normal cardiovascular exam     Neuro/Psych Anxiety Depression    GI/Hepatic GERD  Medicated and Controlled,  Endo/Other  diabetes, Type 2, Oral Hypoglycemic Agents  Renal/GU      Musculoskeletal   Abdominal   Peds  Hematology   Anesthesia Other Findings   Reproductive/Obstetrics                            Anesthesia Physical Anesthesia Plan  ASA: II  Anesthesia Plan: Spinal and MAC   Post-op Pain Management:    Induction: Intravenous  PONV Risk Score and Plan: 2 and Ondansetron and Dexamethasone  Airway Management Planned: Simple Face Mask  Additional Equipment:   Intra-op Plan:   Post-operative Plan:   Informed Consent: I have reviewed the patients History and Physical, chart, labs and discussed the procedure including the risks, benefits and alternatives for the proposed anesthesia with the patient or authorized representative who has indicated his/her understanding and acceptance.     Plan Discussed with: CRNA and Surgeon  Anesthesia Plan Comments:         Anesthesia Quick Evaluation

## 2016-09-24 NOTE — Progress Notes (Signed)
Portable AP Pelvis and Lateral Right Hip X-rays done. 

## 2016-09-24 NOTE — Progress Notes (Signed)
X-ray results noted 

## 2016-09-24 NOTE — Transfer of Care (Signed)
Immediate Anesthesia Transfer of Care Note  Patient: [name]  Procedure(s) Performed: Procedure(s): RIGHT HIP ACETABULUM AND FEMORAL HEAD REVISION WITH BONE GRAFT (Right)  Patient Location: PACU  Anesthesia Type:Spinal  Level of Consciousness: awake, alert  and oriented  Airway & Oxygen Therapy: Patient Spontanous Breathing and Patient connected to face mask  Post-op Assessment: Report given to RN, Post -op Vital signs reviewed and stable and Patient moving all extremities  Post vital signs: stable  Last Vitals:  Vitals:   09/24/16 0611  BP: 133/82  Pulse: 93  Resp: 18  Temp: 36.4 C    Last Pain:  Vitals:   09/24/16 0611  TempSrc: Oral      Patients Stated Pain Goal: 4 (49/67/59 1638)  Complications: No apparent anesthesia complications

## 2016-09-25 LAB — BASIC METABOLIC PANEL
ANION GAP: 9 (ref 5–15)
BUN: 22 mg/dL — AB (ref 6–20)
CALCIUM: 8.6 mg/dL — AB (ref 8.9–10.3)
CO2: 28 mmol/L (ref 22–32)
Chloride: 102 mmol/L (ref 101–111)
Creatinine, Ser: 0.76 mg/dL (ref 0.44–1.00)
GFR calc Af Amer: >60 mL/min (ref >60)
GLUCOSE: 147 mg/dL — AB (ref 65–99)
POTASSIUM: 3.3 mmol/L — AB (ref 3.5–5.1)
SODIUM: 139 mmol/L (ref 135–145)

## 2016-09-25 LAB — CBC
HCT: 31.2 % — ABNORMAL LOW (ref 36.0–46.0)
Hemoglobin: 10.7 g/dL — ABNORMAL LOW (ref 12.0–15.0)
MCH: 30.4 pg (ref 26.0–34.0)
MCHC: 34.3 g/dL (ref 30.0–36.0)
MCV: 88.6 fL (ref 78.0–100.0)
PLATELETS: 156 10*3/uL (ref 150–400)
RBC: 3.52 MIL/uL — AB (ref 3.87–5.11)
RDW: 13.8 % (ref 11.5–15.5)
WBC: 12.4 10*3/uL — AB (ref 4.0–10.5)

## 2016-09-25 NOTE — Evaluation (Signed)
Occupational Therapy Evaluation Patient Details Name: [name] MRN: [id number] DOB: 07/23/1933 Today's Date: 09/25/2016    History of Present Illness revision RTHA- posterior   Clinical Impression   This 83 year old female was admitted for the above. She will benefit from continued OT in acute to increase safety and independence with adls. Goals in acute are for supervision level.  Pt was limited by dizziness this session after walking to bathroom.    Follow Up Recommendations  Supervision/Assistance - 24 hour    Equipment Recommendations  None recommended by OT    Recommendations for Other Services       Precautions / Restrictions Precautions Precautions: Fall;Posterior Hip Restrictions Weight Bearing Restrictions: Yes RLE Weight Bearing: Partial weight bearing RLE Partial Weight Bearing Percentage or Pounds: 50      Mobility Bed Mobility           Sit to supine: Min guard   General bed mobility comments: for safety  Transfers   Equipment used: Rolling walker (2 wheeled)   Sit to Stand: Min guard         General transfer comment: cues for precautions, UE/LE placement    Balance                                           ADL either performed or assessed with clinical judgement   ADL Overall ADL's : Needs assistance/impaired     Grooming: Wash/dry hands;Set up;Sitting   Upper Body Bathing: Set up;Sitting   Lower Body Bathing: Minimal assistance;Sit to/from stand;With adaptive equipment   Upper Body Dressing : Set up;Sitting   Lower Body Dressing: Sit to/from stand;With adaptive equipment;Moderate assistance (min for underwear with reacher)   Toilet Transfer: Min guard;Ambulation;BSC;RW   Toileting- Water quality scientist and Hygiene: Min guard;Sit to/from stand         General ADL Comments: washed peri area and donned underwear.  Ambulated to bathroom.  Reviewed precautions in context of adls. Pt needs min cues to  follow. She has developed strategies for moving; needs to fine tune a couple to follow precautions.  Educated on pillows between knees if sidesleeping.  Pt had dizziness coming back from bathroom. Sitting BP 121/49.     Vision         Perception     Praxis      Pertinent Vitals/Pain Pain Score: 2  Pain Location: right hip Pain Descriptors / Indicators: Sore;Guarding Pain Intervention(s): Limited activity within patient's tolerance;Monitored during session;Premedicated before session;Repositioned     Hand Dominance     Extremity/Trunk Assessment Upper Extremity Assessment Upper Extremity Assessment: Overall WFL for tasks assessed           Communication Communication Communication: No difficulties   Cognition Arousal/Alertness: Awake/alert Behavior During Therapy: WFL for tasks assessed/performed Overall Cognitive Status: Within Functional Limits for tasks assessed                                     General Comments       Exercises     Shoulder Instructions      Home Living Family/patient expects to be discharged to:: Private residence Living Arrangements: Alone Available Help at Discharge: Available PRN/intermittently               Bathroom Shower/Tub: Walk-in shower  Bathroom Toilet: Handicapped height     Home Equipment: Bedside commode;Shower seat;Grab bars - toilet;Grab bars - tub/shower;Hand held shower head;Adaptive equipment          Prior Functioning/Environment Level of Independence: Independent                 OT Problem List: Decreased strength;Decreased activity tolerance;Decreased knowledge of use of DME or AE;Decreased knowledge of precautions;Pain      OT Treatment/Interventions: Self-care/ADL training;DME and/or AE instruction;Patient/family education    OT Goals(Current goals can be found in the care plan section) Acute Rehab OT Goals Patient Stated Goal: to go home OT Goal Formulation: With  patient Time For Goal Achievement: 10/02/16 Potential to Achieve Goals: Good ADL Goals Pt Will Transfer to Toilet: with supervision;bedside commode;ambulating Pt Will Perform Tub/Shower Transfer: Shower transfer;ambulating;shower seat (2 " ledge) Additional ADL Goal #1: pt will not need any cues for thps during adls  OT Frequency: Min 2X/week   Barriers to D/C:            Co-evaluation              AM-PAC PT "6 Clicks" Daily Activity     Outcome Measure Help from another person eating meals?: None Help from another person taking care of personal grooming?: A Little Help from another person toileting, which includes using toliet, bedpan, or urinal?: A Little Help from another person bathing (including washing, rinsing, drying)?: A Little Help from another person to put on and taking off regular upper body clothing?: A Little Help from another person to put on and taking off regular lower body clothing?: A Lot 6 Click Score: 18   End of Session    Activity Tolerance: Patient tolerated treatment well Patient left: in chair;with call bell/phone within reach;with chair alarm set;with nursing/sitter in room  OT Visit Diagnosis: Pain Pain - Right/Left: Right Pain - part of body: Hip                Time: 6144-3154 OT Time Calculation (min): 52 min Charges:  OT General Charges $OT Visit: 1 Procedure OT Evaluation $OT Eval Low Complexity: 1 Procedure OT Treatments $Self Care/Home Management : 23-37 mins G-Codes:     [name], OTR/L [phone] 09/25/2016  [name] 09/25/2016, 9:23 AM

## 2016-09-25 NOTE — Progress Notes (Signed)
   Subjective: 1 Day Post-Op Procedure(s) (LRB): RIGHT HIP ACETABULUM AND FEMORAL HEAD REVISION WITH BONE GRAFT (Right) Patient reports pain as mild.   Patient seen in rounds for Dr. Alvan Dame. Patient is well, and has had no acute complaints or problems other than soreness in her right thigh. No issues overnight. No SOB or chest pain. Doing OT during rounds. Felt a little lightheaded.    Objective: Vital signs in last 24 hours: Temp:  [97.5 F (36.4 C)-98.3 F (36.8 C)] 97.6 F (36.4 C) (07/04 0535) Pulse Rate:  [73-92] 88 (07/04 0535) Resp:  [11-26] 18 (07/04 0535) BP: (108-128)/(46-99) 121/50 (07/04 0535) SpO2:  [94 %-100 %] 97 % (07/04 0535)  Intake/Output from previous day:  Intake/Output Summary (Last 24 hours) at 09/25/16 0830 Last data filed at 09/25/16 0800  Gross per 24 hour  Intake             3550 ml  Output             2900 ml  Net              650 ml    Intake/Output this shift: Total I/O In: 360 [P.O.:360] Out: -   Labs:  Recent Labs  09/25/16 0516  HGB 10.7*    Recent Labs  09/25/16 0516  WBC 12.4*  RBC 3.52*  HCT 31.2*  PLT 156    Recent Labs  09/25/16 0516  NA 139  K 3.3*  CL 102  CO2 28  BUN 22*  CREATININE 0.76  GLUCOSE 147*  CALCIUM 8.6*    EXAM General - Patient is Alert and Oriented Extremity - Neurologically intact Intact pulses distally Dorsiflexion/Plantar flexion intact No cellulitis present Compartment soft Dressing - dressing C/D/I Motor Function - intact, moving foot and toes well on exam.   Past Medical History:  Diagnosis Date  . ALLERGIC RHINITIS   . ANEMIA, IRON DEFICIENCY   . ANXIETY   . ARTHRITIS    s/p bilateral THR  . ASTHMA, EXTRINSIC   . Bronchitis   . Cataracts, both eyes   . Chronic constipation   . FIBROMYALGIA    fibromyalgia  . G E R D   . High cholesterol   . HYPERLIPIDEMIA   . HYPERTENSION   . Interstitial cystitis   . MIGRAINE HEADACHE     Assessment/Plan: 1 Day Post-Op  Procedure(s) (LRB): RIGHT HIP ACETABULUM AND FEMORAL HEAD REVISION WITH BONE GRAFT (Right) Principal Problem:   S/P right TH revision  Estimated body mass index is 23.17 kg/m as calculated from the following:   Height as of this encounter: 5\' 4"  (1.626 m).   Weight as of this encounter: 61.2 kg (135 lb). Advance diet Up with therapy D/C IV fluids when tolerating POs well  DVT Prophylaxis - Aspirin Weight Bearing As Tolerated  Hip Precautions  Will have her continue with therapy today. Plan for DC home today if she progresses. Will DC foley. Hold losartan and amlodipine if BP is less than 130/90.   Ardeen Jourdain, PA-C Orthopaedic Surgery 09/25/2016, 8:30 AM

## 2016-09-25 NOTE — Progress Notes (Signed)
Occupational Therapy Treatment Patient Details Name: [name] MRN: [id number] DOB: 1934-03-17 Today's Date: 09/25/2016    History of present illness revision RTHA- posterior   OT comments  Pt had a posterior LOB when she first stood up from bed.  Ambulated to bathroom. Did not practice shower transfer due to pain and fatique.  Follow Up Recommendations  Supervision/Assistance - 24 hour    Equipment Recommendations  None recommended by OT    Recommendations for Other Services      Precautions / Restrictions Precautions Precautions: Fall;Posterior Hip Restrictions RLE Weight Bearing: Partial weight bearing RLE Partial Weight Bearing Percentage or Pounds: 50       Mobility Bed Mobility               General bed mobility comments: at EOB with NT  Transfers   Equipment used: Rolling walker (2 wheeled)   Sit to Stand: Min guard         General transfer comment: cues for precautions, UE/LE placement    Balance                                           ADL either performed or assessed with clinical judgement   ADL                           Toilet Transfer: Min guard;Minimal assistance;Ambulation;BSC;RW   Toileting- Clothing Manipulation and Hygiene: Min guard         General ADL Comments: pt had posterior LOB when she first stood up from bed and needed assistance to avoid falling backwards.  Did not practice shower transfer at this time due to pain and fatique.  Placed 3;1 over commode. Pt is able to use higher commode without this, but she needs to extend leg and sit straight to avoid breaking hip precautions. Commode gives her more leeway.  She has one of these at home     Vision       Perception     Praxis      Cognition Arousal/Alertness: Awake/alert Behavior During Therapy: WFL for tasks assessed/performed Overall Cognitive Status: Within Functional Limits for tasks assessed                                           Exercises     Shoulder Instructions       General Comments      Pertinent Vitals/ Pain       Pain Score: 5  Pain Location: right hip Pain Descriptors / Indicators: Sore Pain Intervention(s): Limited activity within patient's tolerance;Monitored during session;Repositioned;Premedicated before session;Patient requesting pain meds-RN notified  Home Living                                          Prior Functioning/Environment              Frequency  Min 2X/week        Progress Toward Goals  OT Goals(current goals can now be found in the care plan section)  Progress towards OT goals: Progressing toward goals     Plan      Co-evaluation  AM-PAC PT "6 Clicks" Daily Activity     Outcome Measure   Help from another person eating meals?: None Help from another person taking care of personal grooming?: A Little Help from another person toileting, which includes using toliet, bedpan, or urinal?: A Little Help from another person bathing (including washing, rinsing, drying)?: A Little Help from another person to put on and taking off regular upper body clothing?: A Little Help from another person to put on and taking off regular lower body clothing?: A Lot 6 Click Score: 18    End of Session    OT Visit Diagnosis: Pain Pain - Right/Left: Right Pain - part of body: Hip   Activity Tolerance Patient tolerated treatment well   Patient Left in chair;with call bell/phone within reach;with chair alarm set;with nursing/sitter in room   Nurse Communication          Time: 8676-7209 OT Time Calculation (min): 13 min  Charges: OT General Charges $OT Visit: 1 Procedure OT Treatments $Self Care/Home Management : 8-22 mins  Lesle Chris, OTR/L [phone] 09/25/2016   [place] 09/25/2016, 2:12 PM

## 2016-09-25 NOTE — Progress Notes (Signed)
Physical Therapy Treatment Patient Details Name: [name] MRN: [id number] DOB: 1933/08/19 Today's Date: 09/25/2016    History of Present Illness revision RTHA- posterior    PT Comments    POD # 1 am session Assisted OOB to amb to bathroom then a limited distance in hallway.  Mild unsteadiness and extra VC's to avoid hip flex >90 degrees esp with toilet transfer.  Returned to room and performed THR TE's following HEP handout.  Instructed on proper tech as well as use of ICE.   Follow Up Recommendations  Home health PT;Supervision/Assistance - 24 hour     Equipment Recommendations  Rolling walker with 5" wheels    Recommendations for Other Services       Precautions / Restrictions Precautions Precautions: Fall;Posterior Hip Restrictions Weight Bearing Restrictions: Yes RLE Weight Bearing: Partial weight bearing RLE Partial Weight Bearing Percentage or Pounds: 50%    Mobility  Bed Mobility Overal bed mobility: Needs Assistance Bed Mobility: Supine to Sit     Supine to sit: Min assist     General bed mobility comments: assist for R LE and increased time  Transfers Overall transfer level: Needs assistance Equipment used: Rolling walker (2 wheeled) Transfers: Sit to/from [facility] Sit to Stand: Min guard;Min assist Stand pivot transfers: Min guard;Min assist       General transfer comment: 50% VC's to advance R LE prior to sit and stand.  75% VC's to avoid hip flex >90 degrees.  25% VC's on safety with turns.    Ambulation/Gait Ambulation/Gait assistance: Min guard Ambulation Distance (Feet): 22 Feet Assistive device: Rolling walker (2 wheeled) Gait Pattern/deviations: Step-to pattern;Decreased stance time - right Gait velocity: decreased   General Gait Details: 50% VC's on proper sequencing and proper walker to self distance.  Limited distance due to fatigue.   Stairs            Wheelchair Mobility    Modified Rankin (Stroke  Patients Only)       Balance                                            Cognition Arousal/Alertness: Awake/alert Behavior During Therapy: WFL for tasks assessed/performed Overall Cognitive Status: Within Functional Limits for tasks assessed                                        Exercises   Total Hip Replacement TE's 10 reps ankle pumps 10 reps knee presses 10 reps heel slides 10 reps SAQ's 10 reps ABD Followed by ICE     General Comments        Pertinent Vitals/Pain Pain Assessment: 0-10 Pain Score: 2  Pain Location: right hip Pain Descriptors / Indicators: Operative site guarding;Sore Pain Intervention(s): Monitored during session;Repositioned;Ice applied    Home Living                      Prior Function            PT Goals (current goals can now be found in the care plan section) Progress towards PT goals: Progressing toward goals    Frequency    7X/week      PT Plan Current plan remains appropriate    Co-evaluation  AM-PAC PT "6 Clicks" Daily Activity  Outcome Measure  Difficulty turning over in bed (including adjusting bedclothes, sheets and blankets)?: Total Difficulty moving from lying on back to sitting on the side of the bed? : Total   Help needed moving to and from a bed to chair (including a wheelchair)?: A Lot Help needed walking in hospital room?: A Lot Help needed climbing 3-5 steps with a railing? : Total 6 Click Score: 7    End of Session Equipment Utilized During Treatment: Gait belt Activity Tolerance: Patient tolerated treatment well Patient left: in chair;with call bell/phone within reach Nurse Communication: Mobility status PT Visit Diagnosis: Difficulty in walking, not elsewhere classified (R26.2);Pain     Time: 4695-0722 PT Time Calculation (min) (ACUTE ONLY): 28 min  Charges:  $Gait Training: 8-22 mins $Therapeutic Exercise: 8-22 mins                     G Codes:       Rica Koyanagi  PTA [facility]  Acute  Rehab Pager      [phone]

## 2016-09-25 NOTE — Progress Notes (Signed)
Physical Therapy Treatment Patient Details Name: [name] MRN: [id number] DOB: Sep 24, 1933 Today's Date: 09/25/2016    History of Present Illness revision RTHA- posterior    PT Comments    POD # 1 pm session Pt amb a further distance however demonstrates slight impaired safety cognition (meds?) and extra time toprocess and complete task.   Instructed on THP esp hip flex>90 degrees with stand to sit and sit to stand.  Pt plans to D/C to home tomorrow.   Follow Up Recommendations  Home health PT;Supervision/Assistance - 24 hour     Equipment Recommendations  Rolling walker with 5" wheels    Recommendations for Other Services       Precautions / Restrictions Precautions Precautions: Fall;Posterior Hip Restrictions Weight Bearing Restrictions: Yes RLE Weight Bearing: Partial weight bearing RLE Partial Weight Bearing Percentage or Pounds: 50%    Mobility  Bed Mobility Overal bed mobility: Needs Assistance Bed Mobility: Supine to Sit     Supine to sit: Min assist     General bed mobility comments: Pt OOB in recliner  Transfers Overall transfer level: Needs assistance Equipment used: Rolling walker (2 wheeled) Transfers: Sit to/from [facility] Sit to Stand: Min guard;Min assist Stand pivot transfers: Min guard;Min assist       General transfer comment: 50% VC's to advance R LE prior to sit and stand.  75% VC's to avoid hip flex >90 degrees.  25% VC's on safety with turns.    Ambulation/Gait Ambulation/Gait assistance: Min guard Ambulation Distance (Feet): 40 Feet Assistive device: Rolling walker (2 wheeled) Gait Pattern/deviations: Step-to pattern;Decreased stance time - right Gait velocity: decreased   General Gait Details: 50% VC's on proper sequencing and proper walker to self distance.  Limited distance due to fatigue.   Stairs            Wheelchair Mobility    Modified Rankin (Stroke Patients Only)       Balance                                             Cognition Arousal/Alertness: Awake/alert Behavior During Therapy: WFL for tasks assessed/performed Overall Cognitive Status: Within Functional Limits for tasks assessed                                        Exercises      General Comments        Pertinent Vitals/Pain Pain Assessment: 0-10 Pain Score: 2  Pain Location: right hip Pain Descriptors / Indicators: Operative site guarding;Sore Pain Intervention(s): Monitored during session;Repositioned;Ice applied    Home Living                      Prior Function            PT Goals (current goals can now be found in the care plan section) Progress towards PT goals: Progressing toward goals    Frequency    7X/week      PT Plan Current plan remains appropriate    Co-evaluation              AM-PAC PT "6 Clicks" Daily Activity  Outcome Measure  Difficulty turning over in bed (including adjusting bedclothes, sheets and blankets)?: Total Difficulty moving from lying on back  to sitting on the side of the bed? : Total   Help needed moving to and from a bed to chair (including a wheelchair)?: A Lot Help needed walking in hospital room?: A Lot Help needed climbing 3-5 steps with a railing? : Total 6 Click Score: 7    End of Session Equipment Utilized During Treatment: Gait belt Activity Tolerance: Patient tolerated treatment well Patient left: in chair;with call bell/phone within reach Nurse Communication: Mobility status PT Visit Diagnosis: Difficulty in walking, not elsewhere classified (R26.2);Pain     Time: 3358-2518 PT Time Calculation (min) (ACUTE ONLY): 26 min  Charges:  $Gait Training: 8-22 mins $Therapeutic Activity: 8-22 mins                    G Codes:       {Fain Francis  PTA [facility]  Acute  Rehab Pager      [phone]

## 2016-09-26 ENCOUNTER — Encounter (HOSPITAL_COMMUNITY): Payer: Self-pay | Admitting: Orthopedic Surgery

## 2016-09-26 LAB — CBC
HEMATOCRIT: 27.9 % — AB (ref 36.0–46.0)
Hemoglobin: 9.7 g/dL — ABNORMAL LOW (ref 12.0–15.0)
MCH: 30.8 pg (ref 26.0–34.0)
MCHC: 34.8 g/dL (ref 30.0–36.0)
MCV: 88.6 fL (ref 78.0–100.0)
Platelets: 150 10*3/uL (ref 150–400)
RBC: 3.15 MIL/uL — AB (ref 3.87–5.11)
RDW: 14.1 % (ref 11.5–15.5)
WBC: 13.2 10*3/uL — AB (ref 4.0–10.5)

## 2016-09-26 LAB — BASIC METABOLIC PANEL
ANION GAP: 8 (ref 5–15)
BUN: 21 mg/dL — ABNORMAL HIGH (ref 6–20)
CALCIUM: 8.5 mg/dL — AB (ref 8.9–10.3)
CO2: 26 mmol/L (ref 22–32)
Chloride: 102 mmol/L (ref 101–111)
Creatinine, Ser: 0.78 mg/dL (ref 0.44–1.00)
Glucose, Bld: 134 mg/dL — ABNORMAL HIGH (ref 65–99)
POTASSIUM: 3.8 mmol/L (ref 3.5–5.1)
Sodium: 136 mmol/L (ref 135–145)

## 2016-09-26 MED ORDER — TRAMADOL HCL 50 MG PO TABS: 50.0000 mg | ORAL_TABLET | Freq: Four times a day (QID) | ORAL | 0 refills | Status: DC | PRN

## 2016-09-26 MED ORDER — HYDROCODONE-ACETAMINOPHEN 7.5-325 MG PO TABS
1.0000 | ORAL_TABLET | ORAL | 0 refills | Status: DC | PRN
Start: 2016-09-26 — End: 2017-06-20

## 2016-09-26 NOTE — Progress Notes (Signed)
     Subjective: 2 Days Post-Op Procedure(s) (LRB): RIGHT HIP ACETABULUM AND FEMORAL HEAD REVISION WITH BONE GRAFT (Right)   Patient reports pain as mild, pain controlled.  No events throughout the night.  Many questions asked and answered for the patient. Understands her restrictions.  Ready to be discharged home.  Objective:   VITALS:   Vitals:   09/25/16 2047 09/26/16 0553  BP: (!) 108/42 (!) 123/56  Pulse: 80 82  Resp: 16 14  Temp: 98.1 F (36.7 C) 97.7 F (36.5 C)    Dorsiflexion/Plantar flexion intact Incision: dressing C/D/I No cellulitis present Compartment soft  LABS  Recent Labs  09/25/16 0516 09/26/16 0500  HGB 10.7* 9.7*  HCT 31.2* 27.9*  WBC 12.4* 13.2*  PLT 156 150     Recent Labs  09/25/16 0516 09/26/16 0500  NA 139 136  K 3.3* 3.8  BUN 22* 21*  CREATININE 0.76 0.78  GLUCOSE 147* 134*     Assessment/Plan: 2 Days Post-Op Procedure(s) (LRB): RIGHT HIP ACETABULUM AND FEMORAL HEAD REVISION WITH BONE GRAFT (Right) Up with therapy Discharge home Follow up in 2 weeks at [facility]. Follow up with OLIN,Tasha Diaz D in 2 weeks.  Contact information:  [facility] [address] [place]   PAC  09/26/2016, 7:59 AM

## 2016-09-26 NOTE — Progress Notes (Signed)
Physical Therapy Treatment Patient Details Name: YANIAH THIEMANN MRN: [id number] DOB: 07-21-1933 Today's Date: 09/26/2016    History of Present Illness revision RTHA- posterior    PT Comments    POD # 2 Pt feeling better.  Amb a great distance and performed all THR TE's following HEP handout.     Follow Up Recommendations  Home health PT;Supervision/Assistance - 24 hour     Equipment Recommendations  Rolling walker with 5" wheels    Recommendations for Other Services       Precautions / Restrictions Precautions Precautions: Fall;Posterior Hip Precaution Comments: pt able to recall 3/3 THP and adhere during mobility Restrictions Weight Bearing Restrictions: Yes RLE Weight Bearing: Partial weight bearing RLE Partial Weight Bearing Percentage or Pounds: 50%    Mobility  Bed Mobility               General bed mobility comments: Pt OOB in recliner  Transfers Overall transfer level: Needs assistance Equipment used: Rolling walker (2 wheeled) Transfers: Sit to/from [facility] Sit to Stand: Supervision         General transfer comment: cues for UE placement.  Pt managed RLE without cues  Ambulation/Gait Ambulation/Gait assistance: Supervision Ambulation Distance (Feet): 58 Feet Assistive device: Rolling walker (2 wheeled) Gait Pattern/deviations: Step-to pattern;Decreased stance time - right Gait velocity: decreased   General Gait Details: tolerated amb well and adhering to [facility]   Stairs            Wheelchair Mobility    Modified Rankin (Stroke Patients Only)       Balance                                            Cognition Arousal/Alertness: Awake/alert Behavior During Therapy: WFL for tasks assessed/performed Overall Cognitive Status: Within Functional Limits for tasks assessed                                        Exercises   Total Hip Replacement TE's 10 reps ankle pumps 10  reps knee presses 10 reps heel slides 10 reps SAQ's 10 reps ABD Followed by ICE    General Comments        Pertinent Vitals/Pain Pain Assessment: 0-10 Pain Score: 2  Pain Location: right hip Pain Descriptors / Indicators: Sore;Operative site guarding Pain Intervention(s): Premedicated before session;Repositioned;Ice applied    Home Living                      Prior Function            PT Goals (current goals can now be found in the care plan section) Progress towards PT goals: Progressing toward goals    Frequency           PT Plan Current plan remains appropriate    Co-evaluation              AM-PAC PT "6 Clicks" Daily Activity  Outcome Measure  Difficulty turning over in bed (including adjusting bedclothes, sheets and blankets)?: Total Difficulty moving from lying on back to sitting on the side of the bed? : Total Difficulty sitting down on and standing up from a chair with arms (e.g., wheelchair, bedside commode, etc,.)?: Total Help needed moving to and from a  bed to chair (including a wheelchair)?: A Lot Help needed walking in hospital room?: A Lot Help needed climbing 3-5 steps with a railing? : A Lot 6 Click Score: 9    End of Session Equipment Utilized During Treatment: Gait belt Activity Tolerance: Patient tolerated treatment well Patient left: in chair;with call bell/phone within reach Nurse Communication: Mobility status (pt ready for D/C awaiting on walker to be delivered) PT Visit Diagnosis: Difficulty in walking, not elsewhere classified (R26.2);Pain Pain - Right/Left: Right     Time: 0488-8916 PT Time Calculation (min) (ACUTE ONLY): 28 min  Charges:  $Gait Training: 8-22 mins $Therapeutic Exercise: 8-22 mins                    G Codes:       Rica Koyanagi  PTA [facility]  Acute  Rehab Pager      [phone]

## 2016-09-26 NOTE — Progress Notes (Signed)
RN reviewed discharge instructions with patient and daughter. All questions answered.   Paperwork and prescriptions given.   NT rolled patient down with all belongings to family car.  

## 2016-09-26 NOTE — Progress Notes (Signed)
Occupational Therapy Treatment Patient Details Name: [name] MRN: [id number] DOB: 1933/10/11 Today's Date: 09/26/2016    History of present illness revision RTHA- posterior   OT comments  Education completed this session  Follow Up Recommendations  Supervision/Assistance - 24 hour    Equipment Recommendations  None recommended by OT    Recommendations for Other Services      Precautions / Restrictions Precautions Precautions: Fall;Posterior Hip Restrictions RLE Weight Bearing: Partial weight bearing RLE Partial Weight Bearing Percentage or Pounds: 50%       Mobility Bed Mobility         Supine to sit: Supervision     General bed mobility comments: pt managed RLE without assistance, following THPS  Transfers   Equipment used: Rolling walker (2 wheeled)   Sit to Stand: Supervision         General transfer comment: cues for UE placement.  Pt managed RLE without cues    Balance                                           ADL either performed or assessed with clinical judgement   ADL Overall ADL's : Needs assistance/impaired                         Toilet Transfer: Min guard;Ambulation       Tub/ Shower Transfer: Gaffer;Ambulation;Min guard     General ADL Comments: practiced shower transfer:  simulated smaller ledge.  Pt verbalizes use of reacher for underwear. She will have assist as needed, especially for socks.  Pt did not need to use bathroom:  simulated back to bed     Vision       Perception     Praxis      Cognition Arousal/Alertness: Awake/alert Behavior During Therapy: WFL for tasks assessed/performed Overall Cognitive Status: Within Functional Limits for tasks assessed                                          Exercises     Shoulder Instructions       General Comments      Pertinent Vitals/ Pain       Pain Assessment: 0-10 Pain Score: 2  Pain Location: right  hip Pain Descriptors / Indicators: Sore Pain Intervention(s): Limited activity within patient's tolerance;Monitored during session;Premedicated before session;Repositioned;Ice applied  Home Living                                          Prior Functioning/Environment              Frequency           Progress Toward Goals  OT Goals(current goals can now be found in the care plan section)  Progress towards OT goals: Progressing toward goals (pt plans d/c today; no further OT needs)  Acute Rehab OT Goals Patient Stated Goal: to go home  Plan Discharge plan remains appropriate    Co-evaluation                 AM-PAC PT "6 Clicks" Daily Activity     Outcome Measure  Help from another person eating meals?: None Help from another person taking care of personal grooming?: A Little Help from another person toileting, which includes using toliet, bedpan, or urinal?: A Little Help from another person bathing (including washing, rinsing, drying)?: A Little Help from another person to put on and taking off regular upper body clothing?: A Little Help from another person to put on and taking off regular lower body clothing?: A Lot 6 Click Score: 18    End of Session    OT Visit Diagnosis: Pain Pain - Right/Left: Right Pain - part of body: Hip   Activity Tolerance Patient tolerated treatment well   Patient Left in chair;with call bell/phone within reach;with chair alarm set   Nurse Communication          Time: 8756-4332 OT Time Calculation (min): 15 min  Charges: OT General Charges $OT Visit: 1 Procedure OT Treatments $Self Care/Home Management : 8-22 mins  Lesle Chris, OTR/L [phone] 09/26/2016   [place] 09/26/2016, 8:52 AM

## 2016-09-30 NOTE — Discharge Summary (Signed)
Physician Discharge Summary  Patient ID: [name] MRN: [id number] DOB/AGE: 81-Mar-1935 81 y.o.  Admit date: 09/24/2016 Discharge date: 09/26/2016   Procedures:  Procedure(s) (LRB): RIGHT HIP ACETABULUM AND FEMORAL HEAD REVISION WITH BONE GRAFT (Right)  Attending Physician:  Dr. Paralee Cancel   Admission Diagnoses:   Right total hip arthroplasty instability  Discharge Diagnoses:  Principal Problem:   S/P right TH revision  Past Medical History:  Diagnosis Date  . ALLERGIC RHINITIS   . ANEMIA, IRON DEFICIENCY   . ANXIETY   . ARTHRITIS    s/p bilateral THR  . ASTHMA, EXTRINSIC   . Bronchitis   . Cataracts, both eyes   . Chronic constipation   . FIBROMYALGIA    fibromyalgia  . G E R D   . High cholesterol   . HYPERLIPIDEMIA   . HYPERTENSION   . Interstitial cystitis   . MIGRAINE HEADACHE     HPI:    [name], 84 y.o. 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 The indications for the revision total hip arthroplasty are instability. Onset of symptoms was abrupt starting ~1 years ago with gradually worsening course since that time.  Prior procedures on the right hip include arthroplasty. Patient currently rates pain in the right hip at 10 out of 10 with activity.  There is night pain, worsening of pain with activity and weight bearing, pain that interfers with activities of daily living and pain with passive range of motion. Patient has evidence of previous THA by imaging studies.  This condition presents safety issues increasing the risk of falls.  There is no current active infection.   Risks, benefits and expectations were discussed with the patient.  Risks including but not limited to the risk of anesthesia, blood clots, nerve damage,  blood vessel damage, failure of the prosthesis, infection and up to and including death.  Patient understand the risks, benefits and expectations and wishes to proceed with surgery.   PCP: Sonya Dance, DO   Discharged Condition: good  Hospital Course:  Patient underwent the above stated procedure on 09/24/2016. Patient tolerated the procedure well and brought to the recovery room in good condition and subsequently to the floor.  POD #1 BP: 121/50 ; Pulse: 88 ; Temp: 97.6 F (36.4 C) ; Resp: 18 Patient reports pain as mild.  Patient is well, and has had no acute complaints or problems other than soreness in her right thigh. No issues overnight. No SOB or chest pain. Doing OT during rounds. Felt a little lightheaded.  General - Patient is Alert and Oriented. Extremity - Neurologically intact, intact pulses distally, dorsiflexion/plantar flexion intact, no cellulitis present and compartment soft.  Dressing - dressing C/D/I.  Motor Function - intact, moving foot and toes well on exam.   LABS  Basename    HGB     10.7  HCT     31.2   POD #2  BP: 123/56 ; Pulse: 82 ; Temp: 97.7 F (36.5 C) ; Resp: 14 Patient reports pain as mild, pain controlled.  No events throughout the night.  Many questions asked and answered for the patient. Understands her restrictions.  Ready to be discharged home. Dorsiflexion/plantar flexion intact, incision: dressing C/D/I, no cellulitis present and compartment soft.   LABS  Basename  HGB     9.7  HCT     27.9    Discharge Exam: General appearance: alert, cooperative and no distress Extremities: Homans sign is negative, no sign of DVT, no edema, redness or tenderness in the calves or thighs and no ulcers, gangrene or trophic changes  Disposition: Home with follow up in 2 weeks   Follow-up Information    Paralee Cancel, MD. Schedule an appointment as soon as possible for a visit in 2 week(s).   Specialty:  Orthopedic Surgery Contact information: [address] [place] [address] [phone]           Discharge Instructions    Call MD / Call 911    Complete by:  As directed    If you experience chest pain or shortness of breath, CALL 911 and be transported to the hospital emergency room.  If you develope a fever above 101 F, pus (white drainage) or increased drainage or redness at the wound, or calf pain, call your surgeon's office.   Constipation Prevention    Complete by:  As directed    Drink plenty of fluids.  Prune juice may be helpful.  You may use a stool softener, such as Colace (over the counter) 100 mg twice a day.  Use MiraLax (over the counter) for constipation as needed.   Diet - low sodium heart healthy    Complete by:  As directed    Follow the hip precautions as taught in Physical Therapy    Complete by:  As directed    Increase activity slowly as tolerated    Complete by:  As directed       Allergies as of 09/26/2016      Reactions   Morphine Sulfate Other (See Comments)   Makes her hyper      Medication List    STOP taking these medications   acetaminophen 500 MG tablet Commonly known as:  TYLENOL   HYDROcodone-acetaminophen 5-325 MG tablet Commonly known as:  NORCO/VICODIN Replaced by:  HYDROcodone-acetaminophen 7.5-325 MG tablet   meloxicam 15 MG tablet Commonly known as:  MOBIC   STOOL SOFTENER 100 MG capsule Generic drug:  Docusate Sodium Replaced by:  docusate sodium 100 MG capsule     TAKE these medications   albuterol 108 (90 Base) MCG/ACT inhaler Commonly known as:  PROAIR HFA Inhale 2 puffs into the lungs every 6 (six) hours as needed.   amLODipine 2.5 MG tablet Commonly known as:  NORVASC Take 5 mg by mouth daily.   aspirin 81 MG chewable tablet Chew 1 tablet (81 mg total) by mouth 2 (two) times daily. Take for 4 weeks.   cetirizine 10 MG tablet Commonly known as:  ZYRTEC Take 10 mg by mouth daily.   chlorthalidone 25 MG tablet Commonly known as:   HYGROTON TAKE 1/2 TABLET BY MOUTH DAILY   cyanocobalamin 1000 MCG/ML injection Commonly known as:  (VITAMIN B-12) Inject 1,000 mcg into the muscle every 30 (thirty) days. Notes to patient:  Home regimen   diazepam 5 MG tablet Commonly known as:  VALIUM 1/2 tab q 12 hrs PRN SEVERE panic anxiety   docusate sodium 100 MG capsule Commonly known as:  COLACE Take 1 capsule (100 mg total) by mouth 2 (two) times daily. Replaces:  STOOL SOFTENER 100 MG capsule   DULoxetine 30 MG capsule Commonly known as:  CYMBALTA Take 1 capsule (30 mg total) by mouth daily.   escitalopram 20 MG tablet Commonly known  asLoma Sousa Take 1 tablet (20 mg total) by mouth daily.   ferrous sulfate 325 (65 FE) MG tablet Commonly known as:  FERROUSUL Take 1 tablet (325 mg total) by mouth 3 (three) times daily with meals.   fluticasone 50 MCG/ACT nasal spray Commonly known as:  FLONASE USE 2 SPRAYS INTO EACH NOSTRIL EVERY DAY Notes to patient:  Home regimen   HYDROcodone-acetaminophen 7.5-325 MG tablet Commonly known as:  NORCO Take 1-2 tablets by mouth every 4 (four) hours as needed for moderate pain or severe pain. Replaces:  HYDROcodone-acetaminophen 5-325 MG tablet   losartan 100 MG tablet Commonly known as:  COZAAR Take 1 tablet (100 mg total) by mouth daily.   methocarbamol 500 MG tablet Commonly known as:  ROBAXIN Take 1 tablet (500 mg total) by mouth every 6 (six) hours as needed for muscle spasms.   montelukast 10 MG tablet Commonly known as:  SINGULAIR TAKE 1 TABLET BY MOUTH EACH NIGHT AT BEDTIME   omeprazole 20 MG capsule Commonly known as:  PRILOSEC Take 1 capsule (20 mg total) by mouth daily.   polyethylene glycol packet Commonly known as:  MIRALAX / GLYCOLAX Take 17 g by mouth 2 (two) times daily. Notes to patient:  Laxative; take up to 2 times daily for constipation prevention/treatment   SYMBICORT 80-4.5 MCG/ACT inhaler Generic drug:  budesonide-formoterol INHALE 2 PUFFS BY  MOUTH TWICE DAILY   traMADol 50 MG tablet Commonly known as:  ULTRAM Take 1-2 tablets (50-100 mg total) by mouth every 6 (six) hours as needed (mild to moderate pain). What changed:  how much to take  reasons to take this  additional instructions   Vitamin D3 5000 units Tabs 5,000 IU OTC vitamin D3 daily.        Signed: [place]. Sahar Ryback   PA-C  09/30/2016, 11:03 AM

## 2016-10-01 ENCOUNTER — Telehealth: Payer: Self-pay | Admitting: Family Medicine

## 2016-10-01 NOTE — Telephone Encounter (Signed)
Pt called states she just had Hip replacement surgery at [facility] and that her hemoglobin was low so  Dr. Alvan Dame put her on Iron and she wanted provider to order a blood level check when she comes in for her regular labs. --glh

## 2016-10-01 NOTE — Telephone Encounter (Signed)
Called the patient and stated that Hemoglobin would be included in her regular labs and that I will check with Dr Raliegh Scarlet on when she would need to have blood work drawn to check levels.

## 2016-10-02 ENCOUNTER — Other Ambulatory Visit: Payer: Self-pay | Admitting: Cardiovascular Disease

## 2016-10-04 ENCOUNTER — Ambulatory Visit (INDEPENDENT_AMBULATORY_CARE_PROVIDER_SITE_OTHER): Payer: Medicare Other | Admitting: Family Medicine

## 2016-10-04 DIAGNOSIS — E538 Deficiency of other specified B group vitamins: Secondary | ICD-10-CM

## 2016-10-04 DIAGNOSIS — D649 Anemia, unspecified: Secondary | ICD-10-CM | POA: Diagnosis not present

## 2016-10-04 LAB — POCT HEMOGLOBIN: HEMOGLOBIN: 10.5 g/dL — AB (ref 12.2–16.2)

## 2016-10-04 MED ORDER — CYANOCOBALAMIN 1000 MCG/ML IJ SOLN
1000.0000 ug | Freq: Once | INTRAMUSCULAR | Status: AC
  Administered 2016-10-04: 1000 ug via INTRAMUSCULAR

## 2016-10-04 NOTE — Progress Notes (Signed)
Patient seen today for B12 injection and Hemoglobin check.  B12 injection given with patient supplies in right deltoid.  Patient tolerated well.  Hemoglobin was 10.5.

## 2016-10-10 DIAGNOSIS — Z471 Aftercare following joint replacement surgery: Secondary | ICD-10-CM | POA: Diagnosis not present

## 2016-10-10 DIAGNOSIS — Z96641 Presence of right artificial hip joint: Secondary | ICD-10-CM | POA: Diagnosis not present

## 2016-10-14 ENCOUNTER — Telehealth: Payer: Self-pay | Admitting: Family Medicine

## 2016-10-14 ENCOUNTER — Other Ambulatory Visit: Payer: Self-pay

## 2016-10-14 NOTE — Telephone Encounter (Signed)
Pt called to request Meloxicam 15 MG tablets-- Dr. Jenetta Downer has not prescribed this med for this patient a diff provider did-- Pls contact patient if any [phone] --glh

## 2016-10-14 NOTE — Telephone Encounter (Signed)
Per the patient's cart Meloxicam was discontinued by the orthopedic doctor. Spoke to the patient, per patient she has not stopped the Meloxicam and would like a refill.  Please advise.  Thanks MPulliam, CMA/RT(R)

## 2016-10-15 NOTE — Telephone Encounter (Signed)
Called patient - spoke to her daughter.  They will contact the orthopedic doctor to check to see if she is to continue on the Meloxicam.  MPulliam, CMA/RT(R)

## 2016-10-19 ENCOUNTER — Other Ambulatory Visit: Payer: Self-pay | Admitting: Family Medicine

## 2016-10-29 ENCOUNTER — Emergency Department (HOSPITAL_COMMUNITY): Payer: Medicare Other

## 2016-10-29 ENCOUNTER — Emergency Department (HOSPITAL_COMMUNITY)
Admission: EM | Admit: 2016-10-29 | Discharge: 2016-10-29 | Disposition: A | Discharge status: Home / Self Care | Payer: Medicare Other | Attending: Emergency Medicine | Admitting: Emergency Medicine

## 2016-10-29 ENCOUNTER — Encounter (HOSPITAL_COMMUNITY): Payer: Self-pay

## 2016-10-29 DIAGNOSIS — W1830XA Fall on same level, unspecified, initial encounter: Secondary | ICD-10-CM | POA: Insufficient documentation

## 2016-10-29 DIAGNOSIS — S51012A Laceration without foreign body of left elbow, initial encounter: Secondary | ICD-10-CM | POA: Insufficient documentation

## 2016-10-29 DIAGNOSIS — I10 Essential (primary) hypertension: Secondary | ICD-10-CM | POA: Insufficient documentation

## 2016-10-29 DIAGNOSIS — Z79899 Other long term (current) drug therapy: Secondary | ICD-10-CM | POA: Diagnosis not present

## 2016-10-29 DIAGNOSIS — Y999 Unspecified external cause status: Secondary | ICD-10-CM | POA: Insufficient documentation

## 2016-10-29 DIAGNOSIS — S7012XA Contusion of left thigh, initial encounter: Secondary | ICD-10-CM | POA: Diagnosis not present

## 2016-10-29 DIAGNOSIS — Z741 Need for assistance with personal care: Secondary | ICD-10-CM | POA: Diagnosis not present

## 2016-10-29 DIAGNOSIS — Z87891 Personal history of nicotine dependence: Secondary | ICD-10-CM | POA: Diagnosis not present

## 2016-10-29 DIAGNOSIS — Y93E8 Activity, other personal hygiene: Secondary | ICD-10-CM | POA: Diagnosis not present

## 2016-10-29 DIAGNOSIS — E119 Type 2 diabetes mellitus without complications: Secondary | ICD-10-CM | POA: Diagnosis not present

## 2016-10-29 DIAGNOSIS — M25552 Pain in left hip: Secondary | ICD-10-CM | POA: Diagnosis not present

## 2016-10-29 DIAGNOSIS — Z471 Aftercare following joint replacement surgery: Secondary | ICD-10-CM | POA: Diagnosis not present

## 2016-10-29 DIAGNOSIS — Z96642 Presence of left artificial hip joint: Secondary | ICD-10-CM | POA: Diagnosis not present

## 2016-10-29 DIAGNOSIS — Y92002 Bathroom of unspecified non-institutional (private) residence single-family (private) house as the place of occurrence of the external cause: Secondary | ICD-10-CM | POA: Diagnosis not present

## 2016-10-29 DIAGNOSIS — T148XXA Other injury of unspecified body region, initial encounter: Secondary | ICD-10-CM | POA: Diagnosis not present

## 2016-10-29 MED ORDER — LIDOCAINE-EPINEPHRINE (PF) 2 %-1:200000 IJ SOLN
20.0000 mL | Freq: Once | INTRAMUSCULAR | Status: AC
  Administered 2016-10-29: 20 mL

## 2016-10-29 MED ORDER — BACITRACIN ZINC 500 UNIT/GM EX OINT
1.0000 "application " | TOPICAL_OINTMENT | Freq: Two times a day (BID) | CUTANEOUS | Status: DC
  Administered 2016-10-29: 1 via TOPICAL
  Filled 2016-10-29: qty 0.9

## 2016-10-29 MED ORDER — LIDOCAINE-EPINEPHRINE (PF) 2 %-1:200000 IJ SOLN
INTRAMUSCULAR | Status: AC
  Administered 2016-10-29: 20 mL
  Filled 2016-10-29: qty 20

## 2016-10-29 NOTE — ED Provider Notes (Signed)
[facility] DEPT Provider Note   CSN: [id number] Arrival date & time: 10/29/16  1828     History   Chief Complaint Chief Complaint  Patient presents with  . Fall  . Hip Pain    HPI [name] is a 83 y.o. female.  83 year old female with past medical history below including hypertension, hyperlipidemia, GERD, fibromyalgia, bilateral hip replacement, type 2 diabetes mellitus who presents with left thigh pain after a fall. Just prior to arrival, the patient was transferring from the toilet in the bathroom when she lost her balance and fell onto her left side. She struck her left thigh and left elbow. She did not hit her head or lose consciousness. She complains of moderate pain in her left thigh but not in her hips. No neck, back, chest, or abdominal pain. She has mild pain at the site of the laceration on her left elbow but is able to move her left elbow without problems. She is up-to-date on tetanus. She complains of chronic pain in her right hip since the surgery and is currently due for her daily pain and muscle spasm medications.   The history is provided by the patient.  Fall   Hip Pain     Past Medical History:  Diagnosis Date  . ALLERGIC RHINITIS   . ANEMIA, IRON DEFICIENCY   . ANXIETY   . ARTHRITIS    s/p bilateral THR  . ASTHMA, EXTRINSIC   . Bronchitis   . Cataracts, both eyes   . Chronic constipation   . FIBROMYALGIA    fibromyalgia  . G E R D   . High cholesterol   . HYPERLIPIDEMIA   . HYPERTENSION   . Interstitial cystitis   . MIGRAINE HEADACHE     Patient Active Problem List   Diagnosis Date Noted  . S/P right TH revision 09/24/2016  . Type 2 diabetes mellitus without complication, without long-term current use of insulin ([place]) 01/28/2016  . Medication refused- (any cholesterol ones) 01/20/2016  . Environmental and seasonal allergies 01/20/2016  . Chronic fatigue syndrome with fibromyalgia 01/20/2016  . Vitamin D deficiency 01/19/2016  .  Prediabetes- 6.0 12/17 01/11/2016  . Adjustment disorder with mixed anxiety and depressed mood 01/11/2016  . Medication intolerance- statins- (make her FM much W) 01/10/2016  . Depression 11/24/2015  . Reaction, situational, acute, to stress(husband dying) 09/21/2015  . B12 deficiency 08/30/2015  . Dysuria 08/18/2015  . Chronic Fatigue- 2ndary to FM and Mood d/o  06/20/2015  . Memory loss   . Lumbar radiculopathy, chronic   . Herpes simplex labialis 04/03/2012  . Shingles 04/12/2011  . LBBB (left bundle branch block) 11/13/2010  . MIGRAINE HEADACHE 05/30/2010  . Iron deficiency anemia 11/03/2009  . Asthma, mild persistent 12/19/2008  . Hyperlipidemia 08/12/2007  . Anxiety state 08/12/2007  . Essential hypertension 08/12/2007  . Allergic rhinitis 08/12/2007  . G E R D 08/12/2007  . ARTHRITIS 08/12/2007  . Muscle pain, fibromyalgia 08/12/2007    Past Surgical History:  Procedure Laterality Date  . ABDOMINAL HYSTERECTOMY    . ANTERIOR HIP REVISION Right 09/24/2016   Procedure: RIGHT HIP ACETABULUM AND FEMORAL HEAD REVISION WITH BONE GRAFT;  Surgeon: Paralee Cancel, MD;  Location: [facility];  Service: Orthopedics;  Laterality: Right;  . APPENDECTOMY    . BACK SURGERY    . CATARACT EXTRACTION    . COLONOSCOPY    . DOPPLER ECHOCARDIOGRAPHY    . ESOPHAGOGASTRODUODENOSCOPY ENDOSCOPY    . LUMBAR DISC SURGERY    .  NM MYOVIEW LTD     stress test  . Ovarian cyst removed    . ROTATOR CUFF REPAIR Left 5-6 years ago   Dr Onnie Graham; done at [facility]   . TOTAL HIP ARTHROPLASTY      OB History    No data available       Home Medications    Prior to Admission medications   Medication Sig Start Date End Date Taking? Authorizing Provider  amLODipine (NORVASC) 5 MG tablet Take 5 mg by mouth daily.   Yes [provider]  cetirizine (ZYRTEC) 10 MG tablet Take 10 mg by mouth daily.  03/13/12  Yes Elsie Stain, MD  chlorthalidone (HYGROTON) 25 MG tablet TAKE 1/2 TABLET BY  MOUTH DAILY 07/24/16  Yes [name], MD  Cholecalciferol (VITAMIN D3) 5000 units TABS 5,000 IU OTC vitamin D3 daily. 01/19/16  Yes Opalski, Neoma Laming, DO  cyclobenzaprine (FLEXERIL) 5 MG tablet Take 5 mg by mouth 3 (three) times daily as needed for muscle spasms.   Yes [provider]  diazepam (VALIUM) 5 MG tablet 1/2 tab q 12 hrs PRN SEVERE panic anxiety 03/11/16  Yes Opalski, Neoma Laming, DO  docusate sodium (COLACE) 100 MG capsule Take 1 capsule (100 mg total) by mouth 2 (two) times daily. 09/24/16  Yes Babish, Rodman Key, PA-C  DULoxetine (CYMBALTA) 30 MG capsule Take 1 capsule (30 mg total) by mouth daily. 08/26/16  Yes Opalski, Deborah, DO  escitalopram (LEXAPRO) 10 MG tablet TAKE 1 TABLET BY MOUTH DAILY 10/21/16  Yes Opalski, Deborah, DO  escitalopram (LEXAPRO) 20 MG tablet Take 1 tablet (20 mg total) by mouth daily. 07/29/16  Yes Opalski, Neoma Laming, DO  ferrous sulfate (FERROUSUL) 325 (65 FE) MG tablet Take 1 tablet (325 mg total) by mouth 3 (three) times daily with meals. 09/24/16  Yes Babish, Rodman Key, PA-C  fluticasone (FLONASE) 50 MCG/ACT nasal spray USE 2 SPRAYS INTO EACH NOSTRIL EVERY DAY 06/04/13  Yes Rowe Clack, MD  HYDROcodone-acetaminophen (NORCO/VICODIN) 5-325 MG tablet Take 1-2 tablets by mouth every 6 (six) hours as needed for moderate pain.   Yes [provider]  losartan (COZAAR) 100 MG tablet Take 1 tablet (100 mg total) by mouth daily. 08/30/16  Yes Opalski, Deborah, DO  montelukast (SINGULAIR) 10 MG tablet TAKE 1 TABLET BY MOUTH EACH NIGHT AT BEDTIME 09/06/16  Yes Juanito Doom, MD  omeprazole (PRILOSEC) 20 MG capsule Take 1 capsule (20 mg total) by mouth daily. 08/26/16  Yes Opalski, Neoma Laming, DO  polyethylene glycol (MIRALAX / GLYCOLAX) packet Take 17 g by mouth 2 (two) times daily. 09/24/16  Yes Babish, Rodman Key, PA-C  SYMBICORT 80-4.5 MCG/ACT inhaler INHALE 2 PUFFS BY MOUTH TWICE DAILY 09/05/16  Yes Juanito Doom, MD  traMADol (ULTRAM) 50 MG tablet Take 1-2  tablets (50-100 mg total) by mouth every 6 (six) hours as needed (mild to moderate pain). 09/26/16  Yes Babish, Rodman Key, PA-C  albuterol (PROAIR HFA) 108 (90 Base) MCG/ACT inhaler Inhale 2 puffs into the lungs every 6 (six) hours as needed. 09/04/16   Juanito Doom, MD  amLODipine (NORVASC) 10 MG tablet TAKE 1 TABLET BY MOUTH DAILY Patient not taking: Reported on 10/29/2016 10/02/16   [name], MD  cyanocobalamin (,VITAMIN B-12,) 1000 MCG/ML injection Inject 1,000 mcg into the muscle every 30 (thirty) days.    [provider]  HYDROcodone-acetaminophen (NORCO) 7.5-325 MG tablet Take 1-2 tablets by mouth every 4 (four) hours as needed for moderate pain or severe pain.  Patient not taking: Reported on 10/29/2016 09/26/16   Danae Orleans, PA-C  methocarbamol (ROBAXIN) 500 MG tablet Take 1 tablet (500 mg total) by mouth every 6 (six) hours as needed for muscle spasms. Patient not taking: Reported on 10/29/2016 09/24/16   Danae Orleans, PA-C    Family History Family History  Problem Relation Age of Onset  . Arthritis Mother   . Hypertension Mother   . Heart disease Father   . Arthritis Father   . Hypertension Father   . Depression Sister   . Hyperlipidemia Sister   . Hypertension Other   . Hyperlipidemia Other     Social History Social History  Substance Use Topics  . Smoking status: Former Smoker    Packs/day: 0.30    Years: 10.00    Types: Cigarettes    Quit date: 03/25/1968  . 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 Alcohol use No     Allergies   Morphine sulfate   Review of Systems Review of Systems All other systems reviewed and are negative except that which was mentioned in HPI   Physical Exam Updated Vital Signs BP (!) 142/112 (BP Location: Right Arm)   Pulse 89   Temp 98.1 F (36.7 C) (Oral)   Resp 18   SpO2 98%   Physical Exam  Constitutional: She is oriented to person, place, and time. She  appears well-developed and well-nourished. No distress.  HENT:  Head: Normocephalic and atraumatic.  Moist mucous membranes  Eyes: Pupils are equal, round, and reactive to light. Conjunctivae are normal.  Neck: Normal range of motion. Neck supple.  No midline spinal tenderness  Cardiovascular: Normal rate, regular rhythm and normal heart sounds.   No murmur heard. Pulmonary/Chest: Effort normal and breath sounds normal. She exhibits no tenderness.  Abdominal: Soft. Bowel sounds are normal. She exhibits no distension. There is no tenderness.  Musculoskeletal: She exhibits edema and tenderness.  Hematoma left lateral mid thigh, normal ROM at left hip, knee and ankle; no knee/hip/foot tenderness Normal ROM at left elbow, wrist  Neurological: She is alert and oriented to person, place, and time.  Fluent speech  Skin: Skin is warm and dry.  Stellate 2cm laceration just distal to left elbow olecranon, abrasion left knee  Psychiatric: She has a normal mood and affect. Judgment normal.  Nursing note and vitals reviewed.    ED Treatments / Results  Labs (all labs ordered are listed, but only abnormal results are displayed) Labs Reviewed - No data to display  EKG  EKG Interpretation None       Radiology Dg Elbow Complete Left  Result Date: 10/29/2016 CLINICAL DATA:  83 year old female with history of laceration to the left elbow after fall today. EXAM: LEFT ELBOW - COMPLETE 3+ VIEW COMPARISON:  No priors. FINDINGS: There is no evidence of fracture, dislocation, or joint effusion. There is no evidence of arthropathy or other focal bone abnormality. Soft tissues are unremarkable. No unexpected radiopaque foreign body in the soft tissues. IMPRESSION: Negative. Electronically Signed   By: Vinnie Langton M.D.   On: 10/29/2016 19:39   Dg Hip Unilat With Pelvis 2-3 Views Left  Result Date: 10/29/2016 CLINICAL DATA:  Pain following fall EXAM: DG HIP (WITH OR WITHOUT PELVIS) 2-3V LEFT  COMPARISON:  None. FINDINGS: Frontal pelvis as well as frontal and lateral left hip images were obtained. There are total hip replacements bilaterally with prosthetic components bilaterally appearing well-seated. No  evident acute fracture or dislocation. No erosive change. There is soft tissue swelling lateral to the proximal femur. IMPRESSION: Status post total hip replacements bilaterally with prosthetic components well-seated. No acute fracture or dislocation. There is probable hematoma lateral to the proximal femur. Electronically Signed   By: Lowella Grip III M.D.   On: 10/29/2016 19:36   Dg Femur Min 2 Views Left  Addendum Date: 10/29/2016   ADDENDUM REPORT: 10/29/2016 19:39 ADDENDUM: There is soft tissue prominence lateral to the proximal femur, apparent hematoma. This area measures approximately 12.3 x 5.2 cm. Electronically Signed   By: Lowella Grip III M.D.   On: 10/29/2016 19:39   Result Date: 10/29/2016 CLINICAL DATA:  Pain after fall EXAM: LEFT FEMUR 2 VIEWS COMPARISON:  None. FINDINGS: Frontal and lateral views obtained. There is a total hip replacement on the left with prosthetic components well-seated. No fracture or dislocation. No knee joint effusion. No appreciable arthropathy. IMPRESSION: Status post total hip replacement with prosthetic components well-seated. No acute fracture dislocation. No knee joint effusion. Electronically Signed: By: Lowella Grip III M.D. On: 10/29/2016 19:35    Procedures .Marland KitchenLaceration Repair Date/Time: 10/30/2016 1:00 AM Performed by: Sharlett Iles Authorized by: Sharlett Iles   Consent:    Consent obtained:  Verbal   Consent given by:  Patient Anesthesia (see MAR for exact dosages):    Anesthesia method:  Local infiltration   Local anesthetic:  Lidocaine 2% WITH epi Laceration details:    Location:  Shoulder/arm   Shoulder/arm location:  L elbow   Length (cm):  2 Repair type:    Repair type:  Simple Pre-procedure  details:    Preparation:  Patient was prepped and draped in usual sterile fashion Exploration:    Hemostasis achieved with:  Epinephrine and direct pressure   Wound exploration: wound explored through full range of motion   Treatment:    Area cleansed with:  Betadine   Amount of cleaning:  Standard   Irrigation solution:  Sterile saline   Irrigation volume:  500   Irrigation method:  Pressure wash Skin repair:    Repair method:  Sutures   Suture size:  4-0   Suture material:  Nylon   Suture technique:  Simple interrupted   Number of sutures:  4 Approximation:    Approximation:  Close Post-procedure details:    Dressing:  Antibiotic ointment and non-adherent dressing   Patient tolerance of procedure:  Tolerated well, no immediate complications   (including critical care time)  Medications Ordered in ED Medications  lidocaine-EPINEPHrine (XYLOCAINE W/EPI) 2 %-1:200000 (PF) injection 20 mL (20 mLs Infiltration Given 10/29/16 1852)     Initial Impression / Assessment and Plan / ED Course  I have reviewed the triage vital signs and the nursing notes.  Pertinent imaging results that were available during my care of the patient were reviewed by me and considered in my medical decision making (see chart for details).    Pt w/ left thigh hematoma and left Open laceration after a mechanical fall. No head injury. She is on 281 mg aspirins daily since her right hip surgery, no other anticoagulant use. Neurovascularly intact on exam.  Point films negative for acute injury.  Repaired laceration at bedside, see procedure note for details. Discussed supportive measures, wound care instructions, and return precautions regarding signs of infection. Patient to follow-up with her orthopedic surgeon Dr. Alvan Dame. Patient discharged in satisfactory condition.  Final Clinical Impressions(s) / ED Diagnoses   Final diagnoses:  Elbow laceration, left, initial encounter  Hematoma of left thigh, initial  encounter    New Prescriptions Discharge Medication List as of 10/29/2016  8:35 PM       Jenavieve Freda, Wenda Overland, MD 10/30/16 [phone]

## 2016-10-29 NOTE — ED Notes (Signed)
Per EMS pt fell in attempt to get medications; landed on left hip; hx of hip surgery; no deformity noted just bruising.

## 2016-10-29 NOTE — Discharge Instructions (Signed)
Return to ER if you have any signs of infection at your wound or worsening symptoms from your leg.

## 2016-11-06 ENCOUNTER — Ambulatory Visit (INDEPENDENT_AMBULATORY_CARE_PROVIDER_SITE_OTHER): Payer: Medicare Other

## 2016-11-06 VITALS — BP 118/69 | HR 101 | Wt 143.5 lb

## 2016-11-06 DIAGNOSIS — E538 Deficiency of other specified B group vitamins: Secondary | ICD-10-CM | POA: Diagnosis not present

## 2016-11-06 MED ORDER — CYANOCOBALAMIN 1000 MCG/ML IJ SOLN
1000.0000 ug | Freq: Once | INTRAMUSCULAR | Status: AC
  Administered 2016-11-06: 1000 ug via INTRAMUSCULAR

## 2016-11-06 NOTE — Progress Notes (Signed)
Pt is here for a vitamin B12 injection.  Pt denies gastrointestinal problems or dizziness.  Patient tolerated injection well without complications.  Patient advised to schedule next injection in 30 days.  T. Nelson, CMA  

## 2016-11-08 DIAGNOSIS — Z96649 Presence of unspecified artificial hip joint: Secondary | ICD-10-CM | POA: Diagnosis not present

## 2016-11-08 DIAGNOSIS — Z471 Aftercare following joint replacement surgery: Secondary | ICD-10-CM | POA: Diagnosis not present

## 2016-11-08 DIAGNOSIS — T84060D Wear of articular bearing surface of internal prosthetic right hip joint, subsequent encounter: Secondary | ICD-10-CM | POA: Diagnosis not present

## 2016-11-08 DIAGNOSIS — Z96641 Presence of right artificial hip joint: Secondary | ICD-10-CM | POA: Diagnosis not present

## 2016-11-08 DIAGNOSIS — T84030D Mechanical loosening of internal right hip prosthetic joint, subsequent encounter: Secondary | ICD-10-CM | POA: Diagnosis not present

## 2016-11-19 ENCOUNTER — Other Ambulatory Visit: Payer: Self-pay | Admitting: Cardiovascular Disease

## 2016-11-19 ENCOUNTER — Other Ambulatory Visit: Payer: Self-pay | Admitting: Family Medicine

## 2016-11-19 NOTE — Telephone Encounter (Signed)
Please review and authorize refill if appropriate.  Charyl Bigger, CMA

## 2016-11-20 ENCOUNTER — Other Ambulatory Visit: Payer: Self-pay | Admitting: Family Medicine

## 2016-11-20 NOTE — Telephone Encounter (Signed)
Please review and advise.  Thanks. MPulliam, CMA/RT(R)

## 2016-11-27 DIAGNOSIS — H40013 Open angle with borderline findings, low risk, bilateral: Secondary | ICD-10-CM | POA: Diagnosis not present

## 2016-11-27 DIAGNOSIS — Z961 Presence of intraocular lens: Secondary | ICD-10-CM | POA: Diagnosis not present

## 2016-11-29 ENCOUNTER — Ambulatory Visit (INDEPENDENT_AMBULATORY_CARE_PROVIDER_SITE_OTHER): Payer: Medicare Other | Admitting: Family Medicine

## 2016-11-29 ENCOUNTER — Encounter: Payer: Self-pay | Admitting: Family Medicine

## 2016-11-29 VITALS — BP 134/75 | HR 97 | Ht 64.0 in | Wt 132.0 lb

## 2016-11-29 DIAGNOSIS — F32A Depression, unspecified: Secondary | ICD-10-CM

## 2016-11-29 DIAGNOSIS — F411 Generalized anxiety disorder: Secondary | ICD-10-CM | POA: Diagnosis not present

## 2016-11-29 DIAGNOSIS — E559 Vitamin D deficiency, unspecified: Secondary | ICD-10-CM | POA: Diagnosis not present

## 2016-11-29 DIAGNOSIS — R5382 Chronic fatigue, unspecified: Secondary | ICD-10-CM | POA: Diagnosis not present

## 2016-11-29 DIAGNOSIS — Z13820 Encounter for screening for osteoporosis: Secondary | ICD-10-CM

## 2016-11-29 DIAGNOSIS — Z5320 Procedure and treatment not carried out because of patient's decision for unspecified reasons: Secondary | ICD-10-CM | POA: Diagnosis not present

## 2016-11-29 DIAGNOSIS — I10 Essential (primary) hypertension: Secondary | ICD-10-CM | POA: Diagnosis not present

## 2016-11-29 DIAGNOSIS — E538 Deficiency of other specified B group vitamins: Secondary | ICD-10-CM

## 2016-11-29 DIAGNOSIS — E782 Mixed hyperlipidemia: Secondary | ICD-10-CM

## 2016-11-29 DIAGNOSIS — F329 Major depressive disorder, single episode, unspecified: Secondary | ICD-10-CM

## 2016-11-29 DIAGNOSIS — Z0000 Encounter for general adult medical examination without abnormal findings: Secondary | ICD-10-CM | POA: Diagnosis not present

## 2016-11-29 DIAGNOSIS — R7303 Prediabetes: Secondary | ICD-10-CM | POA: Diagnosis not present

## 2016-11-29 DIAGNOSIS — D509 Iron deficiency anemia, unspecified: Secondary | ICD-10-CM | POA: Diagnosis not present

## 2016-11-29 DIAGNOSIS — F4323 Adjustment disorder with mixed anxiety and depressed mood: Secondary | ICD-10-CM | POA: Diagnosis not present

## 2016-11-29 MED ORDER — CYANOCOBALAMIN 1000 MCG/ML IJ SOLN
1000.0000 ug | Freq: Once | INTRAMUSCULAR | Status: AC
  Administered 2016-11-29: 1000 ug via INTRAMUSCULAR

## 2016-11-29 MED ORDER — ESCITALOPRAM OXALATE 20 MG PO TABS: 30.0000 mg | ORAL_TABLET | Freq: Every day | ORAL | 1 refills | Status: DC

## 2016-11-29 NOTE — Patient Instructions (Addendum)
Patient has to pay for a driver.  She would rather get her labs and B12 injection today than wait for next week.  She understands the risks potentially of this.  We will give her the injection today.  -CMA to please schedule patient for bone density evaluation near future.  - Stop your diazepam as this can have very detrimental effects in someone who is artery off balance due to your recent surgeries etc.  - Please take 1-1/2 tablets of Lexapro or 30 mg daily.  We are increasing this for your mood  - We will obtain fasting lab work today although patient is not fasting.  Since she has difficulty driving at this time and has to pay somebody to come here, patient declines coming back in the near future.      Preventive Care for Adults  A healthy lifestyle and preventive care can promote health and wellness. Preventive health guidelines for men include the following key practices:  .   A routine yearly physical is a good way to check with your health care provider about your health and preventative screening. It is a chance to share any concerns and updates on your health and to receive a thorough exam.  .  Visit your dentist for a routine exam and preventative care every 6 months. Brush your teeth twice a day and floss once a day. Good oral hygiene prevents tooth decay and gum disease.  .  The frequency of eye exams is based on your age, health, family medical history, use of contact lenses, and other factors.  Follow your health care provider's recommendations for frequency of eye exams.  .  Eat a healthy diet.  Foods such as vegetables, fruits, whole grains, low-fat dairy products, and lean protein foods contain the nutrients you need without too many calories.  Decrease your intake of foods high in solid fats, added sugars, and salt.  Eat the right amount of calories for you.  Get information about a proper diet from your health care provider, if necessary.  .  Regular physical  exercise is one of the most important things you can do for your health.  Most adults should get at least 150 minutes of moderate-intensity exercise (any activity that increases your heart rate and causes you to sweat) each week.  In addition, most adults need muscle-strengthening exercises on 2 or more days a week.  .  Maintain a healthy weight. The body mass index (BMI) is a screening tool to identify possible weight problems. It provides an estimate of body fat based on height and weight. Your health care provider can find your BMI and can help you achieve or maintain a healthy weight. For adults 20 years and older: A BMI below 18.5 is considered underweight. A BMI of 18.5 to 24.9 is normal. A BMI of 25 to 29.9 is considered overweight. A BMI of 30 and above is considered obese.  .  Maintain normal blood lipids and cholesterol levels by exercising and minimizing your intake of saturated fat. Eat a balanced diet with plenty of fruit and vegetables. Blood tests for lipids and cholesterol should begin at age 43 and be repeated every 5 years. If your lipid or cholesterol levels are high, you are over 50, or you are at high risk for heart disease, you may need your cholesterol levels checked more frequently. Ongoing high lipid and cholesterol levels should be treated with medicines if diet and exercise are not working.  .  If you  smoke, find out from your health care provider how to quit. If you do not use tobacco, do not start.  . If you choose to drink alcohol, do not have more than 2 drinks per day. One drink is considered to be 12 ounces (355 mL) of beer, 5 ounces (148 mL) of wine, or 1.5 ounces (44 mL) of liquor.  Mar[place] Kitchen Avoid use of street drugs. Do not share needles with anyone. Ask for help if you need support or instructions about stopping the use of drugs.  . High blood pressure causes heart disease and increases the risk of stroke. Your blood pressure should be checked at least every 1-2  years. Ongoing high blood pressure should be treated with medicines, if weight loss and exercise are not effective.  . If you are 84-52 years old, ask your health care provider if you should take aspirin to prevent heart disease.  . Diabetes screening involves taking a blood sample to check your fasting blood sugar level.  This should be done once every 3 years, after age 57, if you are within normal weight and without risk factors for diabetes.  Testing should be considered at a younger age or be carried out more frequently if you are overweight and have at least 1 risk factor for diabetes.  . Colorectal cancer can be detected and often prevented. Most routine colorectal cancer screening begins at the age of 48 and continues through age 66. However, your health care provider may recommend screening at an earlier age if you have risk factors for colon cancer. On a yearly basis, your health care provider may provide home test kits to check for hidden blood in the stool. Use of a small camera at the end of a tube to directly examine the colon (sigmoidoscopy or colonoscopy) can detect the earliest forms of colorectal cancer. Talk to your health care provider about this at age 16, when routine screening begins. Direct exam of the colon should be repeated every 5-10 years through age 78, unless early forms of precancerous polyps or small growths are found.  .  Lung cancer screening is recommended for adults aged 66-80 years who are at high risk for developing lung cancer because of a history of smoking. A yearly low-dose CT scan of the lungs is recommended for people who have at least a 30-pack-year history of smoking and are a current smoker or have quit within the past 15 years. A pack year of smoking is smoking an average of 1 pack of cigarettes a day for 1 year (for example: 1 pack a day for 30 years or 2 packs a day for 15 years). Yearly screening should continue until the smoker has stopped smoking for  at least 15 years. Yearly screening should be stopped for people who develop a health problem that would prevent them from having lung cancer treatment.  . Talk with your health care provider about prostate cancer screening.  . Testicular cancer screening is recommended for adult males. Screening includes self-exam and a health care provider exam. Consult with your health care provider about any symptoms you have or any concerns you have about testicular cancer.  . Use sunscreen. Apply sunscreen liberally and repeatedly throughout the day. You should seek shade when your shadow is shorter than you. Protect yourself by wearing long sleeves, pants, a wide-brimmed hat, and sunglasses year round, whenever you are outdoors.  . Once a month, do a whole-body skin exam, using a mirror to look at the  skin on your back. Tell your health care provider about new moles, moles that have irregular borders, moles that are larger than a pencil eraser, or moles that have changed in shape or color.    ++++++++++++++++++++++++++++++++++++++++++++++++++++++++++++++++++  Stay current with required vaccines (immunizations).  ? Influenza vaccine. All adults should be immunized every year.  ? Tetanus, diphtheria, and acellular pertussis (Td, Tdap) vaccine. An adult who has not previously received Tdap or who does not know his vaccine status should receive 1 dose of Tdap. This initial dose should be followed by tetanus and diphtheria toxoids (Td) booster doses every 10 years. Adults with an unknown or incomplete history of completing a 3-dose immunization series with Td-containing vaccines should begin or complete a primary immunization series including a Tdap dose. Adults should receive a Td booster every 10 years.  ? Varicella vaccine. An adult without evidence of immunity to varicella should receive 2 doses or a second dose if he has previously received 1 dose.  ? Human papillomavirus (HPV) vaccine. Males  aged 80-21 years who have not received the vaccine previously should receive the 3-dose series. Males aged 22-26 years may be immunized. Immunization is recommended through the age of 8 years for any female who has sex with males and did not get any or all doses earlier. Immunization is recommended for any person with an immunocompromised condition through the age of 72 years if he did not get any or all doses earlier. During the 3-dose series, the second dose should be obtained 4-8 weeks after the first dose. The third dose should be obtained 24 weeks after the first dose and 16 weeks after the second dose.  ? Zoster vaccine. One dose is recommended for adults aged 11 years or older unless certain conditions are present.   ? PREVNAR - Pneumococcal 13-valent conjugate (PCV13) vaccine. When indicated, a person who is uncertain of his immunization history and has no record of immunization should receive the PCV13 vaccine. An adult aged 21 years or older who has certain medical conditions and has not been previously immunized should receive 1 dose of PCV13 vaccine. This PCV13 should be followed with a dose of pneumococcal polysaccharide (PPSV23) vaccine. The PPSV23 vaccine dose should be obtained at least 8 weeks after the dose of PCV13 vaccine. An adult aged 66 years or older who has certain medical conditions and previously received 1 or more doses of PPSV23 vaccine should receive 1 dose of PCV13. The PCV13 vaccine dose should be obtained 1 or more years after the last PPSV23 vaccine dose.   ? PNEUMOVAX - Pneumococcal polysaccharide (PPSV23) vaccine. When PCV13 is also indicated, PCV13 should be obtained first. All adults aged 5 years and older should be immunized. An adult younger than age 59 years who has certain medical conditions should be immunized. Any person who resides in a nursing home or long-term care facility should be immunized. An adult smoker should be immunized. People with an  immunocompromised condition and certain other conditions should receive both PCV13 and PPSV23 vaccines. People with human immunodeficiency virus (HIV) infection should be immunized as soon as possible after diagnosis. Immunization during chemotherapy or radiation therapy should be avoided. Routine use of PPSV23 vaccine is not recommended for American Indians, [place] Natives, or people younger than 65 years unless there are medical conditions that require PPSV23 vaccine. When indicated, people who have unknown immunization and have no record of immunization should receive PPSV23 vaccine. One-time revaccination 5 years after the first dose of PPSV23  is recommended for people aged 19-64 years who have chronic kidney failure, nephrotic syndrome, asplenia, or immunocompromised conditions. People who received 1-2 doses of PPSV23 before age 57 years should receive another dose of PPSV23 vaccine at age 28 years or later if at least 5 years have passed since the previous dose. Doses of PPSV23 are not needed for people immunized with PPSV23 at or after age 79 years.   ? Hepatitis A vaccine. Adults who wish to be protected from this disease, have certain high-risk conditions, work with hepatitis A-infected animals, work in hepatitis A research labs, or travel to or work in countries with a high rate of hepatitis A should be immunized. Adults who were previously unvaccinated and who anticipate close contact with an international adoptee during the first 60 days after arrival in the [place] from a country with a high rate of hepatitis A should be immunized.  ? Hepatitis B vaccine. Adults should be immunized if they wish to be protected from this disease, have certain high-risk conditions, may be exposed to blood or other infectious body fluids, are household contacts or sex partners of hepatitis B positive people, are clients or workers in certain care facilities, or travel to or work in countries with a high rate  of hepatitis B.     Preventive Service / Frequency  . Ages 38 to 26  Blood pressure check.  Lipid and cholesterol check  Lung cancer screening. / Every year if you are aged 61-80 years and have a 30-pack-year history of smoking and currently smoke or have quit within the past 15 years. Yearly screening is stopped once you have quit smoking for at least 15 years or develop a health problem that would prevent you from having lung cancer treatment.  Fecal occult blood test (FOBT) of stool. / Every year beginning at age 17 and continuing until age 82. You may not have to do this test if you get a colonoscopy every 10 years.  Flexible sigmoidoscopy** or colonoscopy.** / Every 5 years for a flexible sigmoidoscopy or every 10 years for a colonoscopy beginning at age 93 and continuing until age 42. Screening for abdominal aortic aneurysm (AAA) by ultrasound is recommended for people who have history of high blood pressure or who are current or former smokers.  ++++++++++++++++++++++++++++++++++++++++++++++++++++++++++++++  Recommend Adult Low Dose Aspirin or coated Aspirin 81 mg daily To reduce risk of Colon Cancer 20 % Skin Cancer 26 %  Melanoma 46% and Pancreatic cancer 60%  +++++++++++++++++++++++++++++++++++++++++++++++++++++++++++++  Vitamin D goal is between 50-100. Please make sure that you are taking your Vitamin D as directed.  It is very important as a natural anti-inflammatory - helping with muscle and joint aches; as well as helping hair, skin, and nails; as well as reducing stroke, heart attack and cancer risk. It helps your bones and helps with mood. It also decreases numerous cancer risks so please take it as directed.  - Low Vit D is associated with a 200-300% higher risk for CANCER and 200-300% higher risk for HEART ATTACK & STROKE.  It is also associated with higher death rate at younger ages, autoimmune diseases like Rheumatoid arthritis, Lupus, Multiple Sclerosis;  also many other serious conditions, like depression, Alzheimer's Dementia, infertility, muscle aches, fatigue, fibromyalgia - just to name a few.  +++++++++++++++++++++++++++++++++++++++++++++++++++++++++++  Recommend the book "The END of DIETING" by Dr Excell Seltzer & the book "The END of DIABETES " by Dr Excell Seltzer At [email or web address] - get book & Audio CD's   --->  Being diabetic has a 300% increased risk for heart attack, stroke, cancer, and alzheimer- type vascular dementia. It is very important that you work harder with diet by avoiding all foods that are white. Avoid white rice (brown & wild rice is OK), white potatoes (sweet potatoes in moderation is OK), White bread or wheat bread or anything made out of white flour like bagels, donuts, rolls, buns, biscuits, cakes, pastries, cookies, pizza crust, and pasta (made from white flour & egg whites) - vegetarian pasta or spinach or wheat pasta is OK. Multigrain breads like Arnold's or [employer], or multigrain sandwich thins or flatbreads. Diet, exercise and weight loss can reverse and cure diabetes in the early stages. Diet, exercise and weight loss is very important in the control and prevention of complications of diabetes which affects every system in your body, ie. Brain - dementia/stroke, eyes - glaucoma/blindness, heart - heart attack/heart failure, kidneys - dialysis, stomach - gastric paralysis, intestines - malabsorption, nerves - severe painful neuritis, circulation - gangrene & loss of a leg(s), and finally cancer and Alzheimers.  I recommend avoid fried & greasy foods, sweets/candy, white rice (brown or wild rice or Quinoa is OK), white potatoes (sweet potatoes are OK) - anything made from white flour - bagels, doughnuts, rolls, buns, biscuits,white and wheat breads, pizza crust and traditional pasta made of white flour & egg white(vegetarian pasta or spinach or wheat pasta is OK). Multi-grain bread is OK - like multi-grain flat bread or  sandwich thins. Avoid alcohol in excess.  Exercise is also important. Eat all the vegetables you want - avoid fatty meats, especially red meat and dairy - especially cheese. Cheese is the most concentrated form of trans-fats which is the worst thing to clog up our arteries. Veggie cheese is OK which can be found in the fresh produce section at [employer] or [employer] or [employer].  ++++++++++++++++++++++ DASH Eating Plan  DASH stands for "Dietary Approaches to Stop Hypertension."  The DASH eating plan is a healthy eating plan that has been shown to reduce high blood pressure (hypertension). Additional health benefits may include reducing the risk of type 2 diabetes mellitus, heart disease, and stroke. The DASH eating plan may also help with weight loss.  WHAT DO I NEED TO KNOW ABOUT THE DASH EATING PLAN? For the DASH eating plan, you will follow these general guidelines: . Choose foods with a percent daily value for sodium of less than 5% (as listed on the food label). . Use salt-free seasonings or herbs instead of table salt or sea salt. . Check with your health care provider or pharmacist before using salt substitutes. . Eat lower-sodium products, often labeled as "lower sodium" or "no salt added." . Eat fresh foods. . Eat more vegetables, fruits, and low-fat dairy products. . Choose whole grains. Look for the word "whole" as the first word in the ingredient list. . Choose fish . Limit sweets, desserts, sugars, and sugary drinks. . Choose heart-healthy fats. . Eat veggie cheese . Eat more home-cooked food and less restaurant, buffet, and fast food. . Limit fried foods. Lacinda Axon foods using methods other than frying. . Limit canned vegetables. If you do use them, rinse them well to decrease the sodium. . When eating at a restaurant, ask that your food be prepared with less salt, or no salt if possible.   WHAT FOODS CAN I EAT? Read Dr Fara Olden Fuhrman's books on The End  of Dieting & The End of Diabetes  Grains Whole  grain or whole wheat bread. Brown rice. Whole grain or whole wheat pasta. Quinoa, bulgur, and whole grain cereals. Low-sodium cereals. Corn or whole wheat flour tortillas. Whole grain cornbread. Whole grain crackers. Low-sodium crackers.  Vegetables Fresh or frozen vegetables (raw, steamed, roasted, or grilled). Low-sodium or reduced-sodium tomato and vegetable juices. Low-sodium or reduced-sodium tomato sauce and paste. Low-sodium or reduced-sodium canned vegetables.  Fruits All fresh, canned (in natural juice), or frozen fruits.  Protein Products All fish and seafood. Dried beans, peas, or lentils. Unsalted nuts and seeds. Unsalted canned beans.  Dairy Low-fat dairy products, such as skim or 1% milk, 2% or reduced-fat cheeses, low-fat ricotta or cottage cheese, or plain low-fat yogurt. Low-sodium or reduced-sodium cheeses.  Fats and Oils Tub margarines without trans fats. Light or reduced-fat mayonnaise and salad dressings (reduced sodium). Avocado. Safflower, olive, or canola oils. Natural peanut or almond butter.  Other Unsalted popcorn and pretzels. The items listed above may not be a complete list of recommended foods or beverages. Contact your dietitian for more options.  ++++++++++++++++++++++++++++++++++++++++++++++++++++++++++++++++  WHAT FOODS ARE NOT RECOMMENDED?  Grains/ White flour or wheat flour White bread. White pasta. White rice. Refined cornbread. Bagels and croissants. Crackers that contain trans fat. Vegetables Creamed or fried vegetables. Vegetables in a . Regular canned vegetables. Regular canned tomato sauce and paste. Regular tomato and vegetable juices. Fruits Dried fruits. Canned fruit in light or heavy syrup. Fruit juice. Meat and Other Protein Products Meat in general - RED meat & White meat. Fatty cuts of meat. Ribs, chicken wings, all processed meats as bacon, sausage, bologna, salami, fatback, hot dogs,  bratwurst and packaged luncheon meats. Dairy Whole or 2% milk, cream, half-and-half, and cream cheese. Whole-fat or sweetened yogurt. Full-fat cheeses or blue cheese. Non-dairy creamers and whipped toppings. Processed cheese, cheese spreads, or cheese curds.  Condiments Onion and garlic salt, seasoned salt, table salt, and sea salt. Canned and packaged gravies. Worcestershire sauce. Tartar sauce. Barbecue sauce. Teriyaki sauce. Soy sauce, including reduced sodium. Steak sauce. Fish sauce. Oyster sauce. Cocktail sauce. Horseradish. Ketchup and mustard. Meat flavorings and tenderizers. Bouillon cubes. Hot sauce. Tabasco sauce. Marinades. Taco seasonings. Relishes. Fats and Oils Butter, stick margarine, lard, shortening and bacon fat. Coconut, palm kernel, or palm oils. Regular salad dressings. Pickles and olives. Salted popcorn and pretzels. The items listed above may not be a complete list of foods and beverages to avoid.

## 2016-11-29 NOTE — Progress Notes (Signed)
Subjective:   [name] is a 83 y.o. female who presents for Medicare Annual (Subsequent) preventive examination.  Patient recently had hip surgery hip surgery hence, she has been difficulty getting around, had a fall last year and has had some difficulty adjusting to having to ask for help.    Emotionally, she has been struggling a bit.  She feels anxious and depressed that she cannot do more and has to ask for help.  No suicidal or homicidal ideations.  Not having moments of uncontrollable crying.  Sleeping okay.   Some pain in the hip which causes some pain and keeps her up however.   -  She endorses significant anxiety and takes Cymbalta, Lexapro as well as valium 5mg  as needed for severe anxiety/panic attacks- she is not sure where she got the Valium from.   I told her I did not think that volume was good for her to take at this time.  B12 deficiency.  Started on B12 by her neurologist.   6CIT Screen 11/29/2016  What Year? 0 points  What month? 0 points  What time? 0 points  Count back from 20 0 points  Months in reverse 0 points  Repeat phrase 4 points  Total Score 4    Activities of Daily Living In your present state of health, do you have difficulty performing the following activities?  1- Driving - no 2- Managing money - no 3- Feeding yourself - no 4- Getting from the bed to the chair - no 5- Climbing a flight of stairs -yes, due to rect hip surgery  6- Preparing food and eating - yes, due to recent hip surgery 7- Bathing or showering - no 8- Getting dressed - no 9- Getting to the toilet - no 10- Using the toilet - no 11- Moving around from place to place - no   Review of Systems: General:   Denies fever, chills, unexplained weight loss.  Optho/Auditory:   Denies visual changes, blurred vision/LOV Respiratory:   Denies SOB, DOE more than baseline levels.  Cardiovascular:   Denies chest pain, palpitations, new onset peripheral edema  Gastrointestinal:    Denies nausea, vomiting, diarrhea.  Genitourinary: Denies dysuria, freq/ urgency, flank pain or discharge from genitals.  Endocrine:     Denies hot or cold intolerance, polyuria, polydipsia. Musculoskeletal:   Denies unexplained myalgias, joint swelling, unexplained arthralgias, gait problems.  Skin:  Denies rash, suspicious lesions Neurological:     Denies dizziness, unexplained weakness, numbness  Psychiatric/Behavioral:   Denies suicidal or homicidal ideations, hallucinations          Objective:     Vitals: BP 134/75   Pulse 97   Ht 5\' 4"  (1.626 m)   Wt 132 lb (59.9 kg)   BMI 22.66 kg/m   Body mass index is 22.66 kg/m.   Tobacco Social History   Tobacco Use  Smoking Status Former Smoker  . Packs/day: 0.30  . Years: 10.00  . Pack years: 3.00  . Types: Cigarettes  . Last attempt to quit: 03/25/1968  . Years since quitting: 49.1  Smokeless Tobacco Never Used  Tobacco Comment   Widowed with two children. Retired Network engineer given: Not Answered Comment: Widowed with two children. Retired Web designer   Past Medical History:  Diagnosis Date  . ALLERGIC RHINITIS   . ANEMIA, IRON DEFICIENCY   . ANXIETY   . ARTHRITIS    s/p bilateral THR  . ASTHMA,  EXTRINSIC   . Bronchitis   . Cataracts, both eyes   . Chronic constipation   . FIBROMYALGIA    fibromyalgia  . G E R D   . High cholesterol   . HYPERLIPIDEMIA   . HYPERTENSION   . Interstitial cystitis   . MIGRAINE HEADACHE    Past Surgical History:  Procedure Laterality Date  . ABDOMINAL HYSTERECTOMY    . ANTERIOR HIP REVISION Right 09/24/2016   Procedure: RIGHT HIP ACETABULUM AND FEMORAL HEAD REVISION WITH BONE GRAFT;  Surgeon: Paralee Cancel, MD;  Location: [facility];  Service: Orthopedics;  Laterality: Right;  . APPENDECTOMY    . BACK SURGERY    . CATARACT EXTRACTION    . COLONOSCOPY    . DOPPLER ECHOCARDIOGRAPHY    . ESOPHAGOGASTRODUODENOSCOPY ENDOSCOPY    . LUMBAR DISC SURGERY    . NM MYOVIEW LTD      stress test  . Ovarian cyst removed    . ROTATOR CUFF REPAIR Left 5-6 years ago   Dr Onnie Graham; done at [facility] center   . TOTAL HIP ARTHROPLASTY     Family History  Problem Relation Age of Onset  . Arthritis Mother   . Hypertension Mother   . Heart disease Father   . Arthritis Father   . Hypertension Father   . Depression Sister   . Hyperlipidemia Sister   . Hypertension Other   . Hyperlipidemia Other    Social History   Substance and Sexual Activity  Sexual Activity Not Currently    Outpatient Encounter Medications as of 11/29/2016  Medication Sig  . albuterol (PROAIR HFA) 108 (90 Base) MCG/ACT inhaler Inhale 2 puffs into the lungs every 6 (six) hours as needed.  Marland Kitchen amLODipine (NORVASC) 5 MG tablet Take 5 mg by mouth daily.  . cetirizine (ZYRTEC) 10 MG tablet Take 10 mg by mouth daily.   . Cholecalciferol (VITAMIN D3) 5000 units TABS 5,000 IU OTC vitamin D3 daily.  Marland Kitchen docusate sodium (COLACE) 100 MG capsule Take 1 capsule (100 mg total) by mouth 2 (two) times daily.  . ferrous sulfate (FERROUSUL) 325 (65 FE) MG tablet Take 1 tablet (325 mg total) by mouth 3 (three) times daily with meals.  . fluticasone (FLONASE) 50 MCG/ACT nasal spray USE 2 SPRAYS INTO EACH NOSTRIL EVERY DAY  . HYDROcodone-acetaminophen (NORCO) 7.5-325 MG tablet Take 1-2 tablets by mouth every 4 (four) hours as needed for moderate pain or severe pain.  . montelukast (SINGULAIR) 10 MG tablet TAKE 1 TABLET BY MOUTH EACH NIGHT AT BEDTIME  . polyethylene glycol (MIRALAX / GLYCOLAX) packet Take 17 g by mouth 2 (two) times daily.  . SYMBICORT 80-4.5 MCG/ACT inhaler INHALE 2 PUFFS BY MOUTH TWICE DAILY  . [DISCONTINUED] amLODipine (NORVASC) 10 MG tablet TAKE 1 TABLET BY MOUTH DAILY  . [DISCONTINUED] amLODipine (NORVASC) 5 MG tablet Take 5 mg by mouth daily.  . [DISCONTINUED] chlorthalidone (HYGROTON) 25 MG tablet TAKE 1/2 TABLET BY MOUTH DAILY  . [DISCONTINUED] cyanocobalamin (,VITAMIN B-12,) 1000 MCG/ML injection  Inject 1,000 mcg into the muscle every 30 (thirty) days.   . [DISCONTINUED] DULoxetine (CYMBALTA) 30 MG capsule Take 1 capsule (30 mg total) by mouth daily.  . [DISCONTINUED] escitalopram (LEXAPRO) 10 MG tablet TAKE 1 TABLET BY MOUTH DAILY  . [DISCONTINUED] escitalopram (LEXAPRO) 20 MG tablet Take 1 tablet (20 mg total) by mouth daily.  . [DISCONTINUED] escitalopram (LEXAPRO) 20 MG tablet Take 1.5 tablets (30 mg total) by mouth daily.  . [DISCONTINUED] HYDROcodone-acetaminophen (NORCO/VICODIN) 5-325  MG tablet Take 1-2 tablets by mouth every 6 (six) hours as needed for moderate pain.  . [DISCONTINUED] losartan (COZAAR) 100 MG tablet Take 1 tablet (100 mg total) by mouth daily.  . [DISCONTINUED] omeprazole (PRILOSEC) 20 MG capsule Take 1 capsule (20 mg total) by mouth daily.  . cyclobenzaprine (FLEXERIL) 5 MG tablet Take 5 mg by mouth 3 (three) times daily as needed for muscle spasms.  . methocarbamol (ROBAXIN) 500 MG tablet Take 1 tablet (500 mg total) by mouth every 6 (six) hours as needed for muscle spasms.  . traMADol (ULTRAM) 50 MG tablet Take 1-2 tablets (50-100 mg total) by mouth every 6 (six) hours as needed (mild to moderate pain).  . [DISCONTINUED] diazepam (VALIUM) 5 MG tablet 1/2 tab q 12 hrs PRN SEVERE panic anxiety  . [EXPIRED] cyanocobalamin ((VITAMIN B-12)) injection 1,000 mcg    No facility-administered encounter medications on file as of 11/29/2016.     Activities of Daily Living In your present state of health, do you have any difficulty performing the following activities: 11/29/2016 09/24/2016  Hearing? N N  Vision? N N  Difficulty concentrating or making decisions? N N  Walking or climbing stairs? Y N  Comment patient had recent hip surgery  -  Dressing or bathing? N N  Doing errands, shopping? Y N  Comment recent hip surgery -  Some recent data might be hidden    Patient Care Team: [name], DO as PCP - General (Family Medicine) Ladene Artist, MD as Referring  Physician (Gastroenterology) Myrlene Broker, MD as Referring Physician (Urology) Clent Jacks, MD as Referring Physician (Ophthalmology) Nobie Putnam, MD as Referring Physician (Hematology and Oncology) Latanya Maudlin, MD as Referring Physician (Orthopedic Surgery) Sypher, Herbie Baltimore, MD (Inactive) as Referring Physician (Orthopedic Surgery) [name], MD as Referring Physician (Cardiology) Danella Sensing, MD as Consulting Physician (Dermatology) Juanito Doom, MD as Consulting Physician (Pulmonary Disease) Suella Broad, MD as Consulting Physician (Physical Medicine and Rehabilitation) Rockne Menghini, RPH-CPP as Pharmacist (Cardiology) Alda Berthold, DO as Consulting Physician (Neurology) Rodriguez-Guzman, Raquel, [facility] (Pharmacist)    Assessment:    Exercise Activities and Dietary recommendations    Goals    . Attending meditation / other stress management classes     Stress reduction; learn to live with stress; Setting boundaries for "down time"  Also eliminating any stressors than can be eliminated; Includes personnel etc. Made a list of contact      Fall Risk Fall Risk  11/29/2016 08/30/2015 08/25/2015 11/11/2014 11/19/2012  Falls in the past year? Yes No No No No  Number falls in past yr: 1 - - - -  Injury with Fall? Yes - - - -  Risk for fall due to : (No Data) - - - -  Risk for fall due to: Comment Patient has had recent hip surgery - using a walker - - - -   Depression Screen PHQ 2/9 Scores 04/23/2017 01/21/2017 01/07/2017 11/29/2016  PHQ - 2 Score 1 1 1 2   PHQ- 9 Score 3 3 5 4      Cognitive Function   Montreal Cognitive Assessment  08/25/2015  Visuospatial/ Executive (0/5) 4  Naming (0/3) 3  Attention: Read list of digits (0/2) 2  Attention: Read list of letters (0/1) 1  Attention: Serial 7 subtraction starting at 100 (0/3) 3  Language: Repeat phrase (0/2) 2  Language : Fluency (0/1) 1  Abstraction (0/2) 2  Delayed Recall (0/5) 2  Orientation (0/6)  6  Total 26  Adjusted Score (based on education) 26   6CIT Screen 11/29/2016  What Year? 0 points  What month? 0 points  What time? 0 points  Count back from 20 0 points  Months in reverse 0 points  Repeat phrase 4 points  Total Score 4    Immunization History  Administered Date(s) Administered  . Influenza Split 01/03/2012  . Influenza Whole 12/19/2008, 12/23/2009, 11/13/2010  . Influenza, High Dose Seasonal PF 12/13/2013, 01/10/2015, 01/11/2016  . Influenza,inj,Quad PF,6+ Mos 11/19/2012  . Influenza-Unspecified 01/10/2015, 01/03/2017  . Pneumococcal Conjugate-13 01/28/2013  . Pneumococcal Polysaccharide-23 12/24/2007  . Tdap 11/13/2010   Screening Tests Health Maintenance  Topic Date Due  . OPHTHALMOLOGY EXAM  04/23/2018 (Originally 10/17/2016)  . HEMOGLOBIN A1C  05/29/2017  . FOOT EXAM  01/21/2018  . TETANUS/TDAP  11/12/2020  . INFLUENZA VACCINE  Completed  . DEXA SCAN  Completed  . PNA vac Low Risk Adult  Completed      Plan:     I have personally reviewed and noted the following in the patient's chart:   . Medical and social history . Use of alcohol, tobacco or illicit drugs  . Current medications and supplements . Functional ability and status . Nutritional status . Physical activity . Advanced directives . List of other physicians . Hospitalizations, surgeries, and ER visits in previous 12 months . Vitals . Screenings to include cognitive, depression, and falls . Referrals and appointments  In addition, I have reviewed and discussed with patient certain preventive protocols, quality metrics, and best practice recommendations. A written personalized care plan for preventive services as well as general preventive health recommendations were provided to patient.     [name], DO

## 2016-11-30 LAB — CBC WITH DIFFERENTIAL/PLATELET
BASOS ABS: 0 10*3/uL (ref 0.0–0.2)
Basos: 0 %
EOS (ABSOLUTE): 0.1 10*3/uL (ref 0.0–0.4)
Eos: 1 %
HEMATOCRIT: 38.9 % (ref 34.0–46.6)
Hemoglobin: 12.3 g/dL (ref 11.1–15.9)
Immature Grans (Abs): 0 10*3/uL (ref 0.0–0.1)
Immature Granulocytes: 0 %
LYMPHS: 17 %
Lymphocytes Absolute: 1.1 10*3/uL (ref 0.7–3.1)
MCH: 28.2 pg (ref 26.6–33.0)
MCHC: 31.6 g/dL (ref 31.5–35.7)
MCV: 89 fL (ref 79–97)
MONOCYTES: 9 %
Monocytes Absolute: 0.6 10*3/uL (ref 0.1–0.9)
Neutrophils Absolute: 4.5 10*3/uL (ref 1.4–7.0)
Neutrophils: 73 %
Platelets: 202 10*3/uL (ref 150–379)
RBC: 4.36 x10E6/uL (ref 3.77–5.28)
RDW: 15.2 % (ref 12.3–15.4)
WBC: 6.2 10*3/uL (ref 3.4–10.8)

## 2016-11-30 LAB — T4, FREE: Free T4: 1.05 ng/dL (ref 0.82–1.77)

## 2016-11-30 LAB — COMPREHENSIVE METABOLIC PANEL
ALT: 13 IU/L (ref 0–32)
AST: 15 IU/L (ref 0–40)
Albumin/Globulin Ratio: 2.5 — ABNORMAL HIGH (ref 1.2–2.2)
Albumin: 4.7 g/dL (ref 3.5–4.7)
Alkaline Phosphatase: 87 IU/L (ref 39–117)
BUN / CREAT RATIO: 19 (ref 12–28)
BUN: 15 mg/dL (ref 8–27)
Bilirubin Total: 0.3 mg/dL (ref 0.0–1.2)
CO2: 27 mmol/L (ref 20–29)
Calcium: 9.6 mg/dL (ref 8.7–10.3)
Chloride: 94 mmol/L — ABNORMAL LOW (ref 96–106)
Creatinine, Ser: 0.79 mg/dL (ref 0.57–1.00)
GFR, EST AFRICAN AMERICAN: 81 mL/min/{1.73_m2} (ref >59)
GFR, EST NON AFRICAN AMERICAN: 70 mL/min/{1.73_m2} (ref >59)
GLOBULIN, TOTAL: 1.9 g/dL (ref 1.5–4.5)
Glucose: 108 mg/dL — ABNORMAL HIGH (ref 65–99)
Potassium: 3.8 mmol/L (ref 3.5–5.2)
SODIUM: 137 mmol/L (ref 134–144)
Total Protein: 6.6 g/dL (ref 6.0–8.5)

## 2016-11-30 LAB — LIPID PANEL
CHOL/HDL RATIO: 5 ratio — AB (ref 0.0–4.4)
Cholesterol, Total: 328 mg/dL — ABNORMAL HIGH (ref 100–199)
HDL: 66 mg/dL (ref >39)
LDL CALC: 230 mg/dL — AB (ref 0–99)
Triglycerides: 158 mg/dL — ABNORMAL HIGH (ref 0–149)
VLDL Cholesterol Cal: 32 mg/dL (ref 5–40)

## 2016-11-30 LAB — VITAMIN D 25 HYDROXY (VIT D DEFICIENCY, FRACTURES): Vit D, 25-Hydroxy: 83.9 ng/mL (ref 30.0–100.0)

## 2016-11-30 LAB — TSH: TSH: 1.67 u[IU]/mL (ref 0.450–4.500)

## 2016-11-30 LAB — VITAMIN B12

## 2016-11-30 LAB — HEMOGLOBIN A1C
Est. average glucose Bld gHb Est-mCnc: 117 mg/dL
Hgb A1c MFr Bld: 5.7 % — ABNORMAL HIGH (ref 4.8–5.6)

## 2016-12-01 LAB — HM DIABETES EYE EXAM

## 2016-12-05 DIAGNOSIS — Z96641 Presence of right artificial hip joint: Secondary | ICD-10-CM | POA: Diagnosis not present

## 2016-12-05 DIAGNOSIS — Z471 Aftercare following joint replacement surgery: Secondary | ICD-10-CM | POA: Diagnosis not present

## 2016-12-06 ENCOUNTER — Ambulatory Visit: Payer: Medicare Other

## 2016-12-13 ENCOUNTER — Telehealth: Payer: Self-pay | Admitting: Family Medicine

## 2016-12-13 NOTE — Telephone Encounter (Signed)
12/11/16 rcvd Voice message from Roslyn at [facility]/ [phone] stating Order for Bone Density needs to be changed--pls cll her if any questions [phone], #1, #2. ---Dion Body

## 2016-12-14 ENCOUNTER — Other Ambulatory Visit: Payer: Self-pay | Admitting: Cardiovascular Disease

## 2016-12-16 ENCOUNTER — Other Ambulatory Visit: Payer: Self-pay

## 2016-12-16 DIAGNOSIS — E2839 Other primary ovarian failure: Secondary | ICD-10-CM

## 2016-12-16 NOTE — Telephone Encounter (Signed)
Order placed.  T. Nelson, CMA 

## 2016-12-16 NOTE — Telephone Encounter (Signed)
Rx has been sent to the pharmacy electronically. ° °

## 2016-12-17 DIAGNOSIS — M5416 Radiculopathy, lumbar region: Secondary | ICD-10-CM | POA: Diagnosis not present

## 2016-12-18 ENCOUNTER — Other Ambulatory Visit: Payer: Self-pay | Admitting: Family Medicine

## 2016-12-18 ENCOUNTER — Other Ambulatory Visit: Payer: Self-pay | Admitting: Cardiovascular Disease

## 2016-12-18 NOTE — Telephone Encounter (Signed)
Please confirm with the patien the exact dose of Lexapro she is taking prior to sending in that prescription. #90, no RF until seen in f/up by me for mood, since she recently had a dose increase.  - If you look at my last office visit note, you will see that I told her not to take diazepam.  So, no, do not fill it.

## 2016-12-18 NOTE — Telephone Encounter (Signed)
Please review I was not sure on the Valium.  Thank you.  MPulliam, CMA/RT(R)

## 2016-12-18 NOTE — Telephone Encounter (Signed)
You sent this request to me twice

## 2016-12-23 NOTE — Telephone Encounter (Signed)
Not sure why you sent this to me.  Due to need me to do something with it?

## 2017-01-02 DIAGNOSIS — M5136 Other intervertebral disc degeneration, lumbar region: Secondary | ICD-10-CM | POA: Diagnosis not present

## 2017-01-02 DIAGNOSIS — Z471 Aftercare following joint replacement surgery: Secondary | ICD-10-CM | POA: Diagnosis not present

## 2017-01-02 DIAGNOSIS — Z96641 Presence of right artificial hip joint: Secondary | ICD-10-CM | POA: Diagnosis not present

## 2017-01-03 ENCOUNTER — Ambulatory Visit: Payer: Medicare Other | Admitting: Family Medicine

## 2017-01-03 DIAGNOSIS — Z23 Encounter for immunization: Secondary | ICD-10-CM | POA: Diagnosis not present

## 2017-01-07 ENCOUNTER — Ambulatory Visit (INDEPENDENT_AMBULATORY_CARE_PROVIDER_SITE_OTHER): Payer: Medicare Other | Admitting: Family Medicine

## 2017-01-07 ENCOUNTER — Encounter: Payer: Self-pay | Admitting: Family Medicine

## 2017-01-07 VITALS — BP 119/67 | HR 80 | Ht 64.0 in | Wt 141.4 lb

## 2017-01-07 DIAGNOSIS — F411 Generalized anxiety disorder: Secondary | ICD-10-CM | POA: Diagnosis not present

## 2017-01-07 DIAGNOSIS — F4323 Adjustment disorder with mixed anxiety and depressed mood: Secondary | ICD-10-CM | POA: Diagnosis not present

## 2017-01-07 DIAGNOSIS — E538 Deficiency of other specified B group vitamins: Secondary | ICD-10-CM | POA: Diagnosis not present

## 2017-01-07 MED ORDER — CYANOCOBALAMIN 1000 MCG/ML IJ SOLN
500.0000 ug | Freq: Once | INTRAMUSCULAR | Status: AC
  Administered 2017-01-07: 500 ug via INTRAMUSCULAR

## 2017-01-07 MED ORDER — SERTRALINE HCL 25 MG PO TABS: ORAL_TABLET | ORAL | 0 refills | Status: DC

## 2017-01-07 MED ORDER — ALPRAZOLAM 0.25 MG PO TABS
ORAL_TABLET | ORAL | 0 refills | Status: DC
Start: 2017-01-07 — End: 2017-06-02

## 2017-01-07 NOTE — Patient Instructions (Addendum)
Your Lexapro prescription last given to you was 20 mg tablets--> you are to take 1-1/2 tablets per day.  ---- We are going to wean you down off of these medicines.  you are going to take the 20 mg every day for 2 weeks - then go to a half a tablet of the 20 for 2 weeks which would be 10 mg daily - then you go to a half of a 20 mg tablet ( 10mg ) every other day for 2 weeks  - Then OFF them.   You're going to start the sertraline which is Zoloft, at 25 mg daily for 1 week, then go up to 2 tablets daily until I see you in follow-up in 2-3 weeks from now.  - Only take the Xanax for severe anxiety.  I gave you 0.25 mg.  Or, you can use it if you absolutely cannot sleep.  I gave you 30 pills which should be enough for 90 days to 6 months.  Again, be reminded of all of the detrimental effects this medicine can have on you that we discussed, however, thi smay be better than lemoncello alcohol at night to sleep.

## 2017-01-07 NOTE — Progress Notes (Signed)
Impression and Recommendations:    1. GAD (generalized anxiety disorder)   2. Adjustment disorder with mixed anxiety and depressed mood   3. B12 deficiency     I asked [name], my CMA to please document exactly how much B12 patient has been given monthly the past couple of times so we know exactly what to change dose to.  Your Lexapro prescription last given to you was 20 mg tablets--> you are to take 1-1/2 tablets per day.  ---- We are going to wean you down off of lexapro.  you are going to take the 20 mg every day for 2 weeks - then go to a half a tablet of the 20 for 2 weeks which would be 10 mg daily - then you go to a half of a 20 mg tablet ( 10mg ) every other day for 2 weeks  - Then OFF them.  You're going to start the sertraline which is Zoloft, at 25 mg daily for 1 week, then go up to 2 tablets daily until I see you in follow-up in 2-3 weeks from now.  - Only take the Xanax for severe anxiety.  I gave you 0.25 mg.  Or, you can use it if you absolutely cannot sleep.  I gave you 30 pills which should be enough for 90 days to 6 months.  Again, be reminded of all of the detrimental effects this medicine can have on you that we discussed, however, thi smay be better than lemoncello alcohol at night to sleep.  There are no diagnoses linked to this encounter.  The patient was counseled, risk factors were discussed, anticipatory guidance given.   New Prescriptions   ALPRAZOLAM (XANAX) 0.25 MG TABLET    1 by mouth when necessary severe anxiety   VITAMIN B-12 (CYANOCOBALAMIN) 1000 MCG TABLET    Take 1 tablet (1,000 mcg total) by mouth daily.        Modified Medications   Modified Medication Previous Medication   CHLORTHALIDONE (HYGROTON) 25 MG TABLET chlorthalidone (HYGROTON) 25 MG tablet      TAKE 1/2 TABLET BY MOUTH DAILY    TAKE 1/2 TABLET BY MOUTH DAILY   DULOXETINE (CYMBALTA) 30 MG CAPSULE DULoxetine (CYMBALTA) 30 MG capsule      TAKE 1 CAPSULE BY MOUTH DAILY     Take 1 capsule (30 mg total) by mouth daily.   LOSARTAN (COZAAR) 100 MG TABLET losartan (COZAAR) 100 MG tablet      TAKE 1 TABLET BY MOUTH DAILY    Take 1 tablet (100 mg total) by mouth daily.   OMEPRAZOLE (PRILOSEC) 20 MG CAPSULE omeprazole (PRILOSEC) 20 MG capsule      TAKE 1 CAPSULE BY MOUTH DAILY    Take 1 capsule (20 mg total) by mouth daily.   SERTRALINE (ZOLOFT) 100 MG TABLET sertraline (ZOLOFT) 50 MG tablet      1 p.o. daily    1 p.o. daily      No orders of the defined types were placed in this encounter.    Gross side effects, risk and benefits, and alternatives of medications and treatment plan in general discussed with patient.  Patient is aware that all medications have potential side effects and we are unable to predict every side effect or drug-drug interaction that may occur.   Patient will call with any questions prior to using medication if they have concerns.  Expresses verbal understanding and consents to current therapy and treatment regimen.  No barriers  to understanding were identified.  Red flag symptoms and signs discussed in detail.  Patient expressed understanding regarding what to do in case of emergency\urgent symptoms  Please see AVS handed out to patient at the end of our visit for further patient instructions/ counseling done pertaining to today's office visit.   Return in about 2 weeks (around 01/21/2017) for weening off lexapro and onto Zoloft.  .     Note: This document was prepared using Dragon voice recognition software and may include unintentional dictation errors.  [name] 11:22 AM --------------------------------------------------------------------------------------------------------------------------------------------------------------------------------------------------------------------------------------------    Subjective:    CC:  Chief Complaint  Patient presents with  . Follow-up    anxiety     HPI: [name] is a  83 y.o. female who presents to [place] at [facility] today for follow-up of mood.   Last OV we increased her lexapro-- no difference and no help in her anxiety levels.  We told patient to stop the Valium and patient says that she's having a very difficult time without it.  In fact, she took her daughter's Xanax to help her sleep and it worked very well.   20mg  lexapro to 30-mg lexapro and takes cymbalta.   Not helping.   Still bad anxiety and very tense.  She is still very upset that she is not able to do what she wants to do due to her recent hip surgery.  She has difficulty getting around and does not want to ask for help.  This makes her very tense  "Patient recently had hip surgery hip surgery hence, she has been difficulty getting around, had a fall last year and has had some difficulty adjusting to having to ask for help.        Depression screen [facility] 2/9 04/23/2017 01/21/2017 01/07/2017  Decreased Interest 0 0 0  Down, Depressed, Hopeless 1 1 1   PHQ - 2 Score 1 1 1   Altered sleeping 0 1 1  Tired, decreased energy 1 1 1   Change in appetite 0 0 1  Feeling bad or failure about yourself  1 0 0  Trouble concentrating 0 0 0  Moving slowly or fidgety/restless 0 0 1  Suicidal thoughts 0 0 0  PHQ-9 Score 3 3 5   Difficult doing work/chores Not difficult at all Not difficult at all Not difficult at all     GAD 7 : Generalized Anxiety Score 04/23/2017 01/07/2017 01/11/2016 08/30/2015  Nervous, Anxious, on Edge 1 2 3 1   Control/stop worrying 3 2 3 3   Worry too much - different things 1 1 3 3   Trouble relaxing 3 2 3 3   Restless 0 1 0 0  Easily annoyed or irritable 0 2 1 1   Afraid - awful might happen 0 0 0 0  Total GAD 7 Score 8 10 13 11   Anxiety Difficulty - Somewhat difficult Very difficult Somewhat difficult      Problem  Iron Deficiency Anemia, Unspecified     Wt Readings from Last 3 Encounters:  04/23/17 140 lb 11.2 oz (63.8 kg)  01/21/17 141 lb 11.2 oz  (64.3 kg)  01/07/17 141 lb 6.4 oz (64.1 kg)   BP Readings from Last 3 Encounters:  04/23/17 136/80  01/21/17 133/78  01/07/17 119/67   Pulse Readings from Last 3 Encounters:  04/23/17 79  01/21/17 76  01/07/17 80   BMI Readings from Last 3 Encounters:  04/23/17 24.15 kg/m  01/21/17 24.32 kg/m  01/07/17 24.27 kg/m  Patient Care Team    Relationship Specialty Notifications Start End  [name], DO PCP - General Family Medicine  08/30/15   Ladene Artist, MD Referring Physician Gastroenterology  07/02/10   Myrlene Broker, MD Referring Physician Urology  07/02/10   Clent Jacks, MD Referring Physician Ophthalmology  07/02/10   Nobie Putnam, MD Referring Physician Hematology and Oncology  07/02/10   Latanya Maudlin, MD Referring Physician Orthopedic Surgery  07/02/10   Theodis Sato, MD (Inactive) Referring Physician Orthopedic Surgery  07/02/10   [name], MD Referring Physician Cardiology  07/02/10   Danella Sensing, MD Consulting Physician Dermatology  11/09/15   Juanito Doom, MD Consulting Physician Pulmonary Disease  11/09/15   Suella Broad, MD Consulting Physician Physical Medicine and Rehabilitation  11/09/15    Comment: pain mgt :  injections and pain meds  Rockne Menghini, RPH-CPP Pharmacist Cardiology  11/19/15    Comment:  " [facility]" - med mgt of her HTN per request of Dr Alvester Chou- Cards  Alda Berthold, DO Consulting Physician Neurology  12/17/15   Harrington Challenger, [facility]  Pharmacist  07/29/16    Comment: clinical pharm-D in HTN clinic     Patient Active Problem List   Diagnosis Date Noted  . Medication refused- (any cholesterol ones) 01/20/2016    Priority: High  . Prediabetes- 6.0 12/17 01/11/2016    Priority: High  . Adjustment disorder with mixed anxiety and depressed mood 01/11/2016    Priority: High  . Asthma, mild persistent 12/19/2008    Priority: High  . Hyperlipidemia 08/12/2007    Priority: High  . Essential hypertension  08/12/2007    Priority: High  . Chronic fatigue syndrome with fibromyalgia 01/20/2016    Priority: Medium  . Depression 11/24/2015    Priority: Medium  . B12 deficiency 08/30/2015    Priority: Medium  . Iron deficiency anemia 11/03/2009    Priority: Medium  . Anxiety state 08/12/2007    Priority: Medium  . Muscle pain, fibromyalgia 08/12/2007    Priority: Medium  . Environmental and seasonal allergies 01/20/2016    Priority: Low  . Vitamin D deficiency 01/19/2016    Priority: Low  . Chronic Fatigue- 2ndary to FM and Mood d/o  06/20/2015    Priority: Low  . Memory loss     Priority: Low  . Lumbar radiculopathy, chronic     Priority: Low  . LBBB (left bundle branch block) 11/13/2010    Priority: Low  . G E R D 08/12/2007    Priority: Low  . Iron deficiency anemia, unspecified 04/23/2017  . S/P right TH revision 09/24/2016  . Type 2 diabetes mellitus without complication, without long-term current use of insulin ([place]) 01/28/2016  . Medication intolerance- statins- (make her FM much W) 01/10/2016  . Reaction, situational, acute, to stress(husband dying) 09/21/2015  . Dysuria 08/18/2015  . Herpes simplex labialis 04/03/2012  . Shingles 04/12/2011  . MIGRAINE HEADACHE 05/30/2010  . Allergic rhinitis 08/12/2007  . ARTHRITIS 08/12/2007    Past Medical history, Surgical history, Family history, Social history, Allergies and Medications have been entered into the medical record, reviewed and changed as needed.    Current Meds  Medication Sig  . albuterol (PROAIR HFA) 108 (90 Base) MCG/ACT inhaler Inhale 2 puffs into the lungs every 6 (six) hours as needed.  Marland Kitchen amLODipine (NORVASC) 5 MG tablet Take 5 mg by mouth daily.  . cetirizine (ZYRTEC) 10 MG tablet Take 10 mg by mouth  daily.   . Cholecalciferol (VITAMIN D3) 5000 units TABS 5,000 IU OTC vitamin D3 daily.  . cyclobenzaprine (FLEXERIL) 5 MG tablet Take 5 mg by mouth 3 (three) times daily as needed for muscle spasms.  Marland Kitchen  docusate sodium (COLACE) 100 MG capsule Take 1 capsule (100 mg total) by mouth 2 (two) times daily.  . ferrous sulfate (FERROUSUL) 325 (65 FE) MG tablet Take 1 tablet (325 mg total) by mouth 3 (three) times daily with meals.  . fluticasone (FLONASE) 50 MCG/ACT nasal spray USE 2 SPRAYS INTO EACH NOSTRIL EVERY DAY  . HYDROcodone-acetaminophen (NORCO) 7.5-325 MG tablet Take 1-2 tablets by mouth every 4 (four) hours as needed for moderate pain or severe pain.  . methocarbamol (ROBAXIN) 500 MG tablet Take 1 tablet (500 mg total) by mouth every 6 (six) hours as needed for muscle spasms.  . montelukast (SINGULAIR) 10 MG tablet TAKE 1 TABLET BY MOUTH EACH NIGHT AT BEDTIME  . polyethylene glycol (MIRALAX / GLYCOLAX) packet Take 17 g by mouth 2 (two) times daily.  . SYMBICORT 80-4.5 MCG/ACT inhaler INHALE 2 PUFFS BY MOUTH TWICE DAILY  . traMADol (ULTRAM) 50 MG tablet Take 1-2 tablets (50-100 mg total) by mouth every 6 (six) hours as needed (mild to moderate pain).  . [DISCONTINUED] chlorthalidone (HYGROTON) 25 MG tablet TAKE 1/2 TABLET BY MOUTH DAILY  . [DISCONTINUED] cyanocobalamin (,VITAMIN B-12,) 1000 MCG/ML injection Inject 1,000 mcg into the muscle every 30 (thirty) days.   . [DISCONTINUED] DULoxetine (CYMBALTA) 30 MG capsule Take 1 capsule (30 mg total) by mouth daily.  . [DISCONTINUED] escitalopram (LEXAPRO) 10 MG tablet TAKE 1 TABLET BY MOUTH DAILY  . [DISCONTINUED] escitalopram (LEXAPRO) 20 MG tablet Take 1.5 tablets (30 mg total) by mouth daily.  . [DISCONTINUED] losartan (COZAAR) 100 MG tablet Take 1 tablet (100 mg total) by mouth daily.  . [DISCONTINUED] omeprazole (PRILOSEC) 20 MG capsule Take 1 capsule (20 mg total) by mouth daily.    Allergies:  Allergies  Allergen Reactions  . Morphine Sulfate Other (See Comments)    Makes her hyper     Review of Systems: Review of Systems: General:   No F/C, wt loss Pulm:   No DIB, SOB, pleuritic chest pain Card:  No CP, palpitations Abd:  No  n/v/d or pain Ext:  No inc edema from baseline Psych: no SI/ HI    Objective:   Blood pressure 119/67, pulse 80, height 5\' 4"  (1.626 m), weight 141 lb 6.4 oz (64.1 kg). Body mass index is 24.27 kg/m. General:  Well Developed, well nourished, appropriate for stated age.  Neuro:  Alert and oriented,  extra-ocular muscles intact  HEENT:  Normocephalic, atraumatic, neck supple, no carotid bruits appreciated  Skin:  no gross rash, warm, pink. Cardiac:  RRR, S1 S2 Respiratory:  ECTA B/L and A/P, Not using accessory muscles, speaking in full sentences- unlabored. Vascular:  Ext warm, no cyanosis apprec.; cap RF less 2 sec. Psych:  No HI/SI, judgement and insight good, Euthymic mood. Full Affect.

## 2017-01-21 ENCOUNTER — Encounter: Payer: Self-pay | Admitting: Family Medicine

## 2017-01-21 ENCOUNTER — Ambulatory Visit (INDEPENDENT_AMBULATORY_CARE_PROVIDER_SITE_OTHER): Payer: Medicare Other | Admitting: Family Medicine

## 2017-01-21 VITALS — BP 133/78 | HR 76 | Ht 64.0 in | Wt 141.7 lb

## 2017-01-21 DIAGNOSIS — G4700 Insomnia, unspecified: Secondary | ICD-10-CM

## 2017-01-21 DIAGNOSIS — F411 Generalized anxiety disorder: Secondary | ICD-10-CM | POA: Diagnosis not present

## 2017-01-21 DIAGNOSIS — E538 Deficiency of other specified B group vitamins: Secondary | ICD-10-CM

## 2017-01-21 DIAGNOSIS — F4323 Adjustment disorder with mixed anxiety and depressed mood: Secondary | ICD-10-CM

## 2017-01-21 MED ORDER — VITAMIN B-12 1000 MCG PO TABS: 1000.0000 ug | ORAL_TABLET | Freq: Every day | ORAL | 3 refills | Status: DC

## 2017-01-21 MED ORDER — SERTRALINE HCL 50 MG PO TABS
ORAL_TABLET | ORAL | 1 refills | Status: DC
Start: 2017-01-21 — End: 2017-04-23

## 2017-01-21 NOTE — Progress Notes (Signed)
Impression and Recommendations:    1. Adjustment disorder with mixed anxiety and depressed mood   2. Anxiety state   3. Insomnia, unspecified type   4. B12 deficiency     - Cont to ween Lexapro and add Zoloft up to 50mg   - cont to use Xanax very seldomly.  Explained to patient this can have significant side effects and since she was on Valium for so many years I am worried about her developing dementia etc.  She is only to take this for anxiety and panic -Encourage patient to get out to the [facility] and or engaged in life a little more.  Encouraged to walk her dog. -We will transition from B12 injections per her neurologist over a year ago whom started to oral B12 and will reevaluate how she is doing on oral in a couple of months. The patient was counseled, risk factors were discussed, anticipatory guidance given.   New Prescriptions   VITAMIN B-12 (CYANOCOBALAMIN) 1000 MCG TABLET    Take 1 tablet (1,000 mcg total) by mouth daily.     Discontinued Medications   CYANOCOBALAMIN (,VITAMIN B-12,) 1000 MCG/ML INJECTION    Inject 500 mcg into the muscle every 30 (thirty) days.   ESCITALOPRAM (LEXAPRO) 20 MG TABLET    Take 20 mg by mouth daily.     Modified Medications   Modified Medication Previous Medication   CHLORTHALIDONE (HYGROTON) 25 MG TABLET chlorthalidone (HYGROTON) 25 MG tablet      TAKE 1/2 TABLET BY MOUTH DAILY    TAKE 1/2 TABLET BY MOUTH DAILY   DULOXETINE (CYMBALTA) 30 MG CAPSULE DULoxetine (CYMBALTA) 30 MG capsule      TAKE 1 CAPSULE BY MOUTH DAILY    Take 1 capsule (30 mg total) by mouth daily.   LOSARTAN (COZAAR) 100 MG TABLET losartan (COZAAR) 100 MG tablet      TAKE 1 TABLET BY MOUTH DAILY    Take 1 tablet (100 mg total) by mouth daily.   OMEPRAZOLE (PRILOSEC) 20 MG CAPSULE omeprazole (PRILOSEC) 20 MG capsule      TAKE 1 CAPSULE BY MOUTH DAILY    Take 1 capsule (20 mg total) by mouth daily.   SERTRALINE (ZOLOFT) 100 MG TABLET sertraline (ZOLOFT) 25 MG tablet     1 p.o. daily    1 daily for 1 week, then 2 po qd      Orders Placed This Encounter  Procedures  . B12 and Folate Panel     Gross side effects, risk and benefits, and alternatives of medications and treatment plan in general discussed with patient.  Patient is aware that all medications have potential side effects and we are unable to predict every side effect or drug-drug interaction that may occur.   Patient will call with any questions prior to using medication if they have concerns.  Expresses verbal understanding and consents to current therapy and treatment regimen.  No barriers to understanding were identified.  Red flag symptoms and signs discussed in detail.  Patient expressed understanding regarding what to do in case of emergency\urgent symptoms  Please see AVS handed out to patient at the end of our visit for further patient instructions/ counseling done pertaining to today's office visit.   Return in about 3 months (around 04/23/2017) for 3 mo f/up for b12, anxiety- zoloft started around early Oct.     Note: This document was prepared using Dragon voice recognition software and may include unintentional dictation errors.  [name]  11:27 AM --------------------------------------------------------------------------------------------------------------------------------------------------------------------------------------------------------------------------------------------    Subjective:    CC:  Chief Complaint  Patient presents with  . Follow-up    HPI: [name] is a 83 y.o. female who presents to [place] at [facility] today for follow-up of mood.   Mood-   Better and sleeping better as well.    Currently taking 50 of Zoloft and patient has weaned down to 20mg  per day of her Lexapro.   She is doing well.  She has a chart at home and knows how to slowly wean it over weeks and weeks.  She checks off each day as she takes the  medicine.  No side effects, no headaches, nausea, diarrhea, or any GI upset.  No shakiness etc.  Mood is been well controlled  -Office visit we also started her on Xanax.  Told her to stop her Valium, which patient had never been given by me but had a bunch and is not sure where she got it from.  Had been on that for years prior.  Has only had to take xanax 1/2 tab one time in past 2 wks.    --> B12 def-we had a discussion about her injections of B12.  She has been on this regimen for over a year which was started on by Dr. Posey Pronto her neurologist.  After our discussion we decided patient will start taking the oral supplements and we will discontinue any injections.  We will recheck her levels in 3 months.   ((LAST OV:   Your Lexapro prescription last given to you was 20 mg tablets--> you are to take 1-1/2 tablets per day.  ---- We are going to wean you down off of these medicines.  you are going to take the 20 mg every day for 2 weeks - then go to a half a tablet of the 20 for 2 weeks which would be 10 mg daily - then you go to a half of a 20 mg tablet ( 10mg ) every other day for 2 weeks  - Then OFF them. --> You're going to start the sertraline which is Zoloft, at 25 mg daily for 1 week, then go up to 2 tablets daily until I see you in follow-up in 2-3 weeks from now.  - Only take the Xanax for severe anxiety.  I gave you 0.25 mg.  Or, you can use it if you absolutely cannot sleep.  I gave you 30 pills which should be enough for 90 days to 6 months.  Again, be reminded of all of the detrimental effects this medicine can have on you that we discussed, however, Amelia Jo smay be better than lemoncello alcohol at night to sleep. ))     Depression screen [facility] 2/9 04/23/2017 01/21/2017 01/07/2017  Decreased Interest 0 0 0  Down, Depressed, Hopeless 1 1 1   PHQ - 2 Score 1 1 1   Altered sleeping 0 1 1  Tired, decreased energy 1 1 1   Change in appetite 0 0 1  Feeling bad or failure about yourself  1 0 0    Trouble concentrating 0 0 0  Moving slowly or fidgety/restless 0 0 1  Suicidal thoughts 0 0 0  PHQ-9 Score 3 3 5   Difficult doing work/chores Not difficult at all Not difficult at all Not difficult at all     GAD 7 : Generalized Anxiety Score 04/23/2017 01/07/2017 01/11/2016 08/30/2015  Nervous, Anxious, on Edge 1 2 3 1   Control/stop worrying 3 2  3 3  Worry too much - different things 1 1 3 3   Trouble relaxing 3 2 3 3   Restless 0 1 0 0  Easily annoyed or irritable 0 2 1 1   Afraid - awful might happen 0 0 0 0  Total GAD 7 Score 8 10 13 11   Anxiety Difficulty - Somewhat difficult Very difficult Somewhat difficult      Problem  Iron Deficiency Anemia, Unspecified  Gad (Generalized Anxiety Disorder)  Insomnia     Wt Readings from Last 3 Encounters:  04/23/17 140 lb 11.2 oz (63.8 kg)  01/21/17 141 lb 11.2 oz (64.3 kg)  01/07/17 141 lb 6.4 oz (64.1 kg)   BP Readings from Last 3 Encounters:  04/23/17 136/80  01/21/17 133/78  01/07/17 119/67   Pulse Readings from Last 3 Encounters:  04/23/17 79  01/21/17 76  01/07/17 80   BMI Readings from Last 3 Encounters:  04/23/17 24.15 kg/m  01/21/17 24.32 kg/m  01/07/17 24.27 kg/m     Patient Care Team    Relationship Specialty Notifications Start End  Sonya Dance, DO PCP - General Family Medicine  08/30/15   Ladene Artist, MD Referring Physician Gastroenterology  07/02/10   Myrlene Broker, MD Referring Physician Urology  07/02/10   Clent Jacks, MD Referring Physician Ophthalmology  07/02/10   Nobie Putnam, MD Referring Physician Hematology and Oncology  07/02/10   Latanya Maudlin, MD Referring Physician Orthopedic Surgery  07/02/10   Theodis Sato, MD (Inactive) Referring Physician Orthopedic Surgery  07/02/10   [name], MD Referring Physician Cardiology  07/02/10   Danella Sensing, MD Consulting Physician Dermatology  11/09/15   Juanito Doom, MD Consulting Physician Pulmonary Disease  11/09/15   Suella Broad, MD Consulting Physician Physical Medicine and Rehabilitation  11/09/15    Comment: pain mgt :  injections and pain meds  Rockne Menghini, RPH-CPP Pharmacist Cardiology  11/19/15    Comment:  " [facility]" - med mgt of her HTN per request of Dr Alvester Chou- Cards  Alda Berthold, DO Consulting Physician Neurology  12/17/15   Harrington Challenger, [facility]  Pharmacist  07/29/16    Comment: clinical pharm-D in HTN clinic     Patient Active Problem List   Diagnosis Date Noted  . Medication refused- (any cholesterol ones) 01/20/2016    Priority: High  . Prediabetes- 6.0 12/17 01/11/2016    Priority: High  . Adjustment disorder with mixed anxiety and depressed mood 01/11/2016    Priority: High  . Asthma, mild persistent 12/19/2008    Priority: High  . Hyperlipidemia 08/12/2007    Priority: High  . Essential hypertension 08/12/2007    Priority: High  . Chronic fatigue syndrome with fibromyalgia 01/20/2016    Priority: Medium  . Depression 11/24/2015    Priority: Medium  . B12 deficiency 08/30/2015    Priority: Medium  . Iron deficiency anemia 11/03/2009    Priority: Medium  . Anxiety state 08/12/2007    Priority: Medium  . Muscle pain, fibromyalgia 08/12/2007    Priority: Medium  . Environmental and seasonal allergies 01/20/2016    Priority: Low  . Vitamin D deficiency 01/19/2016    Priority: Low  . Chronic Fatigue- 2ndary to FM and Mood d/o  06/20/2015    Priority: Low  . Memory loss     Priority: Low  . Lumbar radiculopathy, chronic     Priority: Low  . LBBB (left bundle branch block) 11/13/2010  Priority: Low  . G E R D 08/12/2007    Priority: Low  . Iron deficiency anemia, unspecified 04/23/2017  . GAD (generalized anxiety disorder) 04/23/2017  . Insomnia 04/23/2017  . S/P right TH revision 09/24/2016  . Type 2 diabetes mellitus without complication, without long-term current use of insulin ([place]) 01/28/2016  . Medication intolerance- statins- (make her FM much  W) 01/10/2016  . Reaction, situational, acute, to stress(husband dying) 09/21/2015  . Dysuria 08/18/2015  . Herpes simplex labialis 04/03/2012  . Shingles 04/12/2011  . MIGRAINE HEADACHE 05/30/2010  . Allergic rhinitis 08/12/2007  . ARTHRITIS 08/12/2007    Past Medical history, Surgical history, Family history, Social history, Allergies and Medications have been entered into the medical record, reviewed and changed as needed.    Current Meds  Medication Sig  . albuterol (PROAIR HFA) 108 (90 Base) MCG/ACT inhaler Inhale 2 puffs into the lungs every 6 (six) hours as needed.  . ALPRAZolam (XANAX) 0.25 MG tablet 1 by mouth when necessary severe anxiety  . amLODipine (NORVASC) 5 MG tablet Take 5 mg by mouth daily.  . cetirizine (ZYRTEC) 10 MG tablet Take 10 mg by mouth daily.   . Cholecalciferol (VITAMIN D3) 5000 units TABS 5,000 IU OTC vitamin D3 daily.  . cyclobenzaprine (FLEXERIL) 5 MG tablet Take 5 mg by mouth 3 (three) times daily as needed for muscle spasms.  Marland Kitchen docusate sodium (COLACE) 100 MG capsule Take 1 capsule (100 mg total) by mouth 2 (two) times daily.  . ferrous sulfate (FERROUSUL) 325 (65 FE) MG tablet Take 1 tablet (325 mg total) by mouth 3 (three) times daily with meals.  . fluticasone (FLONASE) 50 MCG/ACT nasal spray USE 2 SPRAYS INTO EACH NOSTRIL EVERY DAY  . HYDROcodone-acetaminophen (NORCO) 7.5-325 MG tablet Take 1-2 tablets by mouth every 4 (four) hours as needed for moderate pain or severe pain.  . methocarbamol (ROBAXIN) 500 MG tablet Take 1 tablet (500 mg total) by mouth every 6 (six) hours as needed for muscle spasms.  . montelukast (SINGULAIR) 10 MG tablet TAKE 1 TABLET BY MOUTH EACH NIGHT AT BEDTIME  . polyethylene glycol (MIRALAX / GLYCOLAX) packet Take 17 g by mouth 2 (two) times daily.  . SYMBICORT 80-4.5 MCG/ACT inhaler INHALE 2 PUFFS BY MOUTH TWICE DAILY  . traMADol (ULTRAM) 50 MG tablet Take 1-2 tablets (50-100 mg total) by mouth every 6 (six) hours as needed  (mild to moderate pain).  . [DISCONTINUED] chlorthalidone (HYGROTON) 25 MG tablet TAKE 1/2 TABLET BY MOUTH DAILY  . [DISCONTINUED] cyanocobalamin (,VITAMIN B-12,) 1000 MCG/ML injection Inject 500 mcg into the muscle every 30 (thirty) days.  . [DISCONTINUED] DULoxetine (CYMBALTA) 30 MG capsule Take 1 capsule (30 mg total) by mouth daily.  . [DISCONTINUED] escitalopram (LEXAPRO) 20 MG tablet Take 20 mg by mouth daily.  . [DISCONTINUED] losartan (COZAAR) 100 MG tablet Take 1 tablet (100 mg total) by mouth daily.  . [DISCONTINUED] omeprazole (PRILOSEC) 20 MG capsule Take 1 capsule (20 mg total) by mouth daily.  . [DISCONTINUED] sertraline (ZOLOFT) 25 MG tablet 1 daily for 1 week, then 2 po qd  . [DISCONTINUED] sertraline (ZOLOFT) 50 MG tablet 1 p.o. daily    Allergies:  Allergies  Allergen Reactions  . Morphine Sulfate Other (See Comments)    Makes her hyper     Review of Systems: Review of Systems: General:   No F/C, wt loss Pulm:   No DIB, SOB, pleuritic chest pain Card:  No CP, palpitations Abd:  No n/v/d  or pain Ext:  No inc edema from baseline Psych: no SI/ HI    Objective:   Blood pressure 133/78, pulse 76, height 5\' 4"  (1.626 m), weight 141 lb 11.2 oz (64.3 kg). Body mass index is 24.32 kg/m. General:  Well Developed, well nourished, appropriate for stated age.  Neuro:  Alert and oriented,  extra-ocular muscles intact  HEENT:  Normocephalic, atraumatic, neck supple, no carotid bruits appreciated  Skin:  no gross rash, warm, pink. Cardiac:  RRR, S1 S2 Respiratory:  ECTA B/L and A/P, Not using accessory muscles, speaking in full sentences- unlabored. Vascular:  Ext warm, no cyanosis apprec.; cap RF less 2 sec. Psych:  No HI/SI, judgement and insight good, Euthymic mood. Full Affect.

## 2017-01-21 NOTE — Patient Instructions (Addendum)
You are going to start your oral B12 pills daily started now.   Then we can recheck your labs in 3-1/2 months form now.  - this is important to rekc that the oral pills are having proper effect.  Feb 1st or so- is when labs needed.

## 2017-02-04 ENCOUNTER — Ambulatory Visit: Payer: Medicare Other

## 2017-02-10 ENCOUNTER — Telehealth: Payer: Self-pay | Admitting: Pulmonary Disease

## 2017-02-10 NOTE — Telephone Encounter (Signed)
Spoke with pt, she states she wil have to find another inhaler equivalent to Proair because at the first of the year her insurance will not cover it. The CSR on the phone gave her the names below but I wanted to see if they would cover proventil or Ventolin? The insurance usually covers one of these. She will call back to let us know so we can make the necessary changes, I advised her to ask about Symbicort coverage as well. Will await a cal back.    Anoro Atrovent HFA Trelegy Ellipta Combivent respimat Bevespi

## 2017-02-17 ENCOUNTER — Other Ambulatory Visit: Payer: Self-pay | Admitting: Family Medicine

## 2017-02-17 NOTE — Telephone Encounter (Signed)
Attempted to contact pt. No answer, no option to leave a message. Will try back.  

## 2017-02-19 DIAGNOSIS — Z96641 Presence of right artificial hip joint: Secondary | ICD-10-CM | POA: Diagnosis not present

## 2017-02-19 DIAGNOSIS — Z471 Aftercare following joint replacement surgery: Secondary | ICD-10-CM | POA: Diagnosis not present

## 2017-02-19 DIAGNOSIS — M7061 Trochanteric bursitis, right hip: Secondary | ICD-10-CM | POA: Diagnosis not present

## 2017-02-19 NOTE — Telephone Encounter (Signed)
Spoke with pt, who states she has not contacted [facility].  Pt states she does not need a refill at this time. Pt states she will call back once refill is needed. Nothing further is needed at this time.

## 2017-02-20 DIAGNOSIS — L720 Epidermal cyst: Secondary | ICD-10-CM | POA: Diagnosis not present

## 2017-02-27 DIAGNOSIS — M5416 Radiculopathy, lumbar region: Secondary | ICD-10-CM | POA: Diagnosis not present

## 2017-02-27 DIAGNOSIS — M5136 Other intervertebral disc degeneration, lumbar region: Secondary | ICD-10-CM | POA: Diagnosis not present

## 2017-03-10 ENCOUNTER — Other Ambulatory Visit: Payer: Self-pay | Admitting: Cardiovascular Disease

## 2017-03-10 ENCOUNTER — Other Ambulatory Visit: Payer: Self-pay | Admitting: Family Medicine

## 2017-03-10 NOTE — Telephone Encounter (Signed)
Rx(s) sent to pharmacy electronically.  

## 2017-04-23 ENCOUNTER — Ambulatory Visit (INDEPENDENT_AMBULATORY_CARE_PROVIDER_SITE_OTHER): Payer: Medicare Other | Admitting: Family Medicine

## 2017-04-23 ENCOUNTER — Encounter: Payer: Self-pay | Admitting: Family Medicine

## 2017-04-23 VITALS — BP 136/80 | HR 79 | Ht 64.0 in | Wt 140.7 lb

## 2017-04-23 DIAGNOSIS — R5382 Chronic fatigue, unspecified: Secondary | ICD-10-CM

## 2017-04-23 DIAGNOSIS — M797 Fibromyalgia: Secondary | ICD-10-CM

## 2017-04-23 DIAGNOSIS — I10 Essential (primary) hypertension: Secondary | ICD-10-CM | POA: Diagnosis not present

## 2017-04-23 DIAGNOSIS — D509 Iron deficiency anemia, unspecified: Secondary | ICD-10-CM | POA: Insufficient documentation

## 2017-04-23 DIAGNOSIS — F4323 Adjustment disorder with mixed anxiety and depressed mood: Secondary | ICD-10-CM | POA: Diagnosis not present

## 2017-04-23 DIAGNOSIS — F411 Generalized anxiety disorder: Secondary | ICD-10-CM

## 2017-04-23 DIAGNOSIS — F329 Major depressive disorder, single episode, unspecified: Secondary | ICD-10-CM | POA: Diagnosis not present

## 2017-04-23 DIAGNOSIS — G4700 Insomnia, unspecified: Secondary | ICD-10-CM

## 2017-04-23 DIAGNOSIS — E559 Vitamin D deficiency, unspecified: Secondary | ICD-10-CM

## 2017-04-23 DIAGNOSIS — F32A Depression, unspecified: Secondary | ICD-10-CM

## 2017-04-23 DIAGNOSIS — E538 Deficiency of other specified B group vitamins: Secondary | ICD-10-CM

## 2017-04-23 HISTORY — DX: Insomnia, unspecified: G47.00

## 2017-04-23 MED ORDER — SERTRALINE HCL 100 MG PO TABS: ORAL_TABLET | ORAL | 1 refills | Status: DC

## 2017-04-23 NOTE — Patient Instructions (Signed)
Patient prefers to be called with her lab results not have it placed on my chart.  We will call you over the next couple of days -by Friday with those results.  Follow-up in 6 weeks to see how you are doing on the Zoloft after increasing it from 50 mg daily to 100.  If you have any problems before 6-week follow-up visit please call us with any concerns you have.  Continue to walk -goal should be 45 minutes daily or 2  20-minute sessions.      What is Chronic Stress Syndrome, Symptoms & Ways to Deal With it   What is Chronic Stress Syndrome?  Chronic Stress Syndrome is something which can now be called as a medical condition due to the amount of stress an individual is going through these days. Chronic Stress Syndrome causes the body and mind to shutdown and the person has no control over himself or herself. Due to the demands of modern day life and the hardship throughout day and night takes its toll over a period of time and the body and brain starts demanding rest and a break. This leads to certain symptoms where your performance level starts to dip at work, you become irritable both at work and at home, you may stop enjoying activities you previously liked, you may become depressed, you may get angry for even small things. Chronic Stress Syndrome can significantly impact your quality life. Thus it is important understand the symptoms of Chronic Stress Syndrome and react accordingly in order to cope up with it.  It is important to note here that a balanced work-home equation should be drawn to cut down symptoms of Chronic Stress Syndrome. Minor stressors can be overcome by the body's inbuilt stress response but when there is unending stress for a long period of time then an external help is required to ease the stress.  Chronic Stress Syndrome can physically and psychologically drain you over a period of time. For such cases stress management is the best way to cope up with Chronic Stress  Syndrome. If Chronic Stress Syndrome is not treated then it may result in many health hazards like anxiety, muscle pain, insomnia, and high blood pressure along with a compromised immune system leading to frequent infections and missed days from work.    What are the Symptoms of Chronic Stress Syndrome?   The symptoms of Chronic Stress Syndrome are variable and range from generalized symptoms to emotional symptoms along with behavioral and cognitive symptoms. Some of these symptoms have been delineated below:  Generalized Symptoms of Chronic Stress Syndrome are: Anxiety Depression Social isolation Headache Abdominal pain Lack of sleep Back pain Difficulty in concentrating Hypertension Hemorrhoids Varicose veins Panic attacks/ Panic disorder Cardiovascular diseases.   Some of the Emotional Symptoms of Chronic Stress Syndrome are: To become easily agitated, moody and frustrated Feeling overwhelmed which makes you feel like you are losing control. Having difficulty relaxing and have a peaceful mind Having low self esteem Feeling lonely Feeling worthless Feeling depressed Avoiding social environment.   Some of the Physical Symptoms of Chronic Stress Syndrome are: Headaches Lethargy Alternating diarrhea and constipation Nausea Muscles aches and pains Insomnia Rapid heartbeat and chest pain Infections and frequent colds Decreased libido Nervousness and shaking Tinnitus Sweaty palms Dry mouth Clenched jaw.  Some of the Cognitive Symptoms of Chronic Stress Syndrome are: Constant worrying Racing thoughts Disorganization and forgetfulness Inability to focus Poor judgment Abundance of negativity.  Some of the Behavioral Symptoms of Chronic  Stress Syndrome are: Changes in appetite with less desire to eat Avoiding responsibilities Indulgence in alcohol or recreational drug use Increased nail biting and being fidgety Ways to Deal With Chronic Stress  Syndrome    Chronic Stress Syndrome is not something which cannot be addressed. A bit of effort from your side in the form of lifestyle modifications, a little bit of exercise, a balanced work life equation can do wonders and help you get rid of Chronic Stress Syndrome.  Get Proper Sleep: It has been proved that Chronic Stress Syndrome causes loss of sleep where an individual may not even be able to sleep for days unending. This may result in the individual feeling lethargic and unable to focus at work the following morning. This may lead to decreased performance at work. Thus, it is important to have a good sleep-wake cycle. For this, try and not drink any caffeinated beverage about four hours prior to going to sleep, as caffeine pumps up the adrenaline and causes you to stay awake resulting ultimately in Chronic Stress Syndrome.  Avoid Alcohol and Drugs: Another way to get rid of Chronic Stress Syndrome is lifestyle modifications. Stay away from alcohol and other recreational drugs. Take Short Frequent Breaks at Work: Try to take frequent breaks from work and do not work continuously. Try and manage your work in such a way that you even meet your deadline and come home on time for a happy dinner with family. A good time spent with family and kids does wonders in not only dealing with Chronic Stress Syndrome but also preventing it.  Become Physically Active: Another step towards getting rid of Chronic Stress Syndrome is physical activity. If you do not have time to spend at the gym then at least try and go for daily walks for about half an hour a day which not only keeps the stress away but also is good for your overall health. Physical activity leads to production of endorphins which will make you feel relaxed and feel good.  Healthy Diet Can Help You Deal With Chronic Stress Syndrome: Have a balanced and healthy diet is another step towards a stress free life and keeping Chronic Stress Syndrome at  [place]. If time is a constraint then you can try eating three small meals a day. Try and avoid fast foods and take foods which are healthy and rich in proteins, fiber, and carbohydrates to boost your energy system.  Music Can Soothe Your Mind: Light music is one of the best and most effective relaxation techniques that one can try to overcome stress. It has shown to calm down the mind and take you away from all the stressors that you may be having. These days it is also being used as a therapy in some institutes for overcoming stress. It is important here to discuss the importance of a good social support system for patients with Chronic Stress Syndrome, as a good social support framework can do wonders in taking the stress away from the patient and overcoming Chronic Stress Syndrome.  Meditation Can Help You Deal With Chronic Stress Syndrome Effectively: Meditation and yoga has also shown to be quite effective in relaxing the mind and coping up with Chronic Stress Syndrome   In cases where these measures are not helpful, then it is time for you to consult with a skilled psychologist or a psychiatrist for potential therapies or medications to control the stress response.   The psychologist can help you with a variety of steps  for coping up with Chronic Stress Syndrome. Relaxation techniques and behavioral therapy are some of the methods employed by psychologists. In some cases, medications can also be given to help relax the patient.  Since Chronic Stress Syndrome is both emotionally and physically draining for the patient and it also adversely affects the family life of the patient hence it is important for the patient to recognize the condition and taking steps to cope up with it. Escaping measures like alcohol and drug use are of no help as they only aggravate the condition apart from their other health hazards. If this condition is ignored or left untreated it can lead to various medical conditions like  anxiety and depression and various other medical conditions.  Last but not least, smile as often as you can as it is the best gift that you can give to someone. The best way to stay relaxed is to have a good smile, exercise daily, spend time with your family, meditation and if required consultation with a good psychologist so that you can live a stress free life and overcome the symptoms of Chronic Stress Syndrome.

## 2017-04-23 NOTE — Progress Notes (Signed)
Impression and Recommendations:    1. Adjustment disorder with mixed anxiety and depressed mood   2. Essential hypertension   3. B12 deficiency   4. Muscle pain, fibromyalgia   5. Chronic fatigue syndrome with fibromyalgia   6. Vitamin D deficiency   7. Depression, unspecified depression type   8. GAD (generalized anxiety disorder)   9. Chronic fatigue   10. Iron deficiency anemia, unspecified iron deficiency anemia type     1. Mood - To help cope with her recently increased anxiety, irritability, and depression, her dosage of sertraline will be increased from 50 to 100.  If 100 is too much, she can alternate between 100 one day, 50 the next, and eventually taper up to 100 every day.  The importance of slowly increasing medicine was discussed at length with the patient, to help reduce the incidence of side-effects.  - follow up in 6 weeks for a mood check to evaluate the increase in zoloft from 50 to 100.  2. Prediabetes - Last A1C was 11/29/2016 at 5.7.  Will not measure it again at this time and defer to future appointment.  3. Chronic Fatigue, Vitamin Deficiencies  - Lab work will be drawn today to check her iron (folate panel), CBC, vitamin D, B12.  - Patient prefers to be called with her lab results, not have it placed on MyChart.  We will call her over the next couple of days.  - Patient knows that she can call any time if further concerns arise.  4. Health & Exercise - She should continue to walk regularly.  She should try to walk as much as tolerated by her hip. Goal will be 45 minutes daily, or two 25 minute sessions.  - Patient was advised not to "overdo" it and to pay attention to her hip, as she is still healing from her surgery.  - Patient should hydrate adequately, consuming at least half of her body weight in ounces of water, per day.  5. HTN -Stable patient will continue medications.    Orders Placed This Encounter  Procedures  . VITAMIN D 25 Hydroxy  (Vit-D Deficiency, Fractures)  . Vitamin B12  . Folate  . Iron and TIBC  . Ferritin  . CBC with Differential/Platelet    Meds ordered this encounter  Medications  . sertraline (ZOLOFT) 100 MG tablet    Sig: 1 p.o. daily    Dispense:  90 tablet    Refill:  1    Gross side effects, risk and benefits, and alternatives of medications and treatment plan in general discussed with patient.  Patient is aware that all medications have potential side effects and we are unable to predict every side effect or drug-drug interaction that may occur.   Patient will call with any questions prior to using medication if they have concerns.  Expresses verbal understanding and consents to current therapy and treatment regimen.  No barriers to understanding were identified.  Red flag symptoms and signs discussed in detail.  Patient expressed understanding regarding what to do in case of emergency\urgent symptoms  Please see AVS handed out to patient at the end of our visit for further patient instructions/ counseling done pertaining to today's office visit.   Return for Follow-up in 6 weeks from the increase in Zoloft from 50 to 100.    Note: This note was prepared with assistance of Dragon voice recognition software. Occasional wrong-word or sound-a-like substitutions may have occurred due to the inherent  limitations of voice recognition software.   This document serves as a record of services personally performed by [name], DO. It was created on her behalf by [name], a trained medical scribe. The creation of this record is based on the scribe's personal observations and the provider's statements to them.   I have reviewed the above medical documentation for accuracy and completeness and I concur.  [name] 04/23/17 11:29  AM   -----------------------------------------------------------------------------------------------------------------------------------------------------------------------------------------------------------------------    Subjective:     HPI: [name] is a 84 y.o. female who presents to [place] at [facility] today for issues as discussed below.  Her doctor told her that her hip has healed fine, and she can now take a couple of steps without using her cane.  The patient did not expect her recovery to take this long.  She notes that she usually gets well quickly, but hasn't had anything major done in 20 years.  She currently wears a fall monitor on her wrist, provided by her daughter [name].  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  She lives only 2 miles away.  Mood Recently started on Zoloft (01/21/2017).  Weaned off of Lexapro and began on sertraline.  Patient believes she has only taken about two xanax in the past month.  Anxiety has been improved.  Mood overall has been good lately.  She notes she is just "tense," but has always been a tense person.  Currently she is grinding her teeth.  Since her surgery, she admits that there are still several things she is unable to do.  She feels irritable and frustrated sometimes because she still can't do what she wants to do.  However, she has recently begun walking her dog.  The other day, she walked her dog for 40 minutes.  She has been walking regularly.  She continues to do physical therapy every day by herself, and has a therapist come in once a week- which patient pays for out of pocket.  Her younger sister has Parkinson's dementia and she had a recent interaction with her that caused her great distress.  She has always been very close with her sister and hasn't seen her since last 03-Oct-2022, right before she fell.  Parkinson's is the same thing she went through with her husband, who passed away  in 10-03-2015, which adds to her distress.  She has been sleeping fine.  [facility] noting that she does plenty of things on her own at home.  Fibromyalgia Her fibromyalgia pain is well-managed with tylenol occasionally in addition to her current medications of Cymbalta and occasionally a muscle relaxer  Chronic Fatigue Her energy level seems to be better, but when her bloodwork is drawn next (today), she'd like to check her vitamin levels.  She would like to know if she can possibly use a multivitamin instead of the various OTC vitamins she is currently taking.  Historical neurologist placed her on injectable B12 because it was way below normal at the time.  On September 7th 2018, she was taken off of the B12 because it was very high.  Since stopping the injectables and going to oral, she feels good.  She continues to take every vitamin prescribed, including iron tablets.  She takes a stool softener to combat possible constipation.  Prediabetes Last A1C in office was 11/29/2016 at 5.7.   Wt Readings from  Last 3 Encounters:  04/23/17 140 lb 11.2 oz (63.8 kg)  01/21/17 141 lb 11.2 oz (64.3 kg)  01/07/17 141 lb 6.4 oz (64.1 kg)   BP Readings from Last 3 Encounters:  04/23/17 136/80  01/21/17 133/78  01/07/17 119/67   Pulse Readings from Last 3 Encounters:  04/23/17 79  01/21/17 76  01/07/17 80   BMI Readings from Last 3 Encounters:  04/23/17 24.15 kg/m  01/21/17 24.32 kg/m  01/07/17 24.27 kg/m     Patient Care Team    Relationship Specialty Notifications Start End  [name], DO PCP - General Family Medicine  08/30/15   Ladene Artist, MD Referring Physician Gastroenterology  07/02/10   Myrlene Broker, MD Referring Physician Urology  07/02/10   Clent Jacks, MD Referring Physician Ophthalmology  07/02/10   Nobie Putnam, MD Referring Physician Hematology and Oncology  07/02/10   Latanya Maudlin, MD Referring Physician  Orthopedic Surgery  07/02/10   Theodis Sato, MD (Inactive) Referring Physician Orthopedic Surgery  07/02/10   [name], MD Referring Physician Cardiology  07/02/10   Danella Sensing, MD Consulting Physician Dermatology  11/09/15   Juanito Doom, MD Consulting Physician Pulmonary Disease  11/09/15   Suella Broad, MD Consulting Physician Physical Medicine and Rehabilitation  11/09/15    Comment: pain mgt :  injections and pain meds  Rockne Menghini, RPH-CPP Pharmacist Cardiology  11/19/15    Comment:  " [facility]" - med mgt of her HTN per request of Dr Alvester Chou- Cards  Alda Berthold, [place] Physician Neurology  12/17/15   Harrington Challenger, [facility]  Pharmacist  07/29/16    Comment: clinical pharm-D in HTN clinic     Patient Active Problem List   Diagnosis Date Noted  . Medication refused- (any cholesterol ones) 01/20/2016    Priority: High  . Prediabetes- 6.0 12/17 01/11/2016    Priority: High  . Adjustment disorder with mixed anxiety and depressed mood 01/11/2016    Priority: High  . Asthma, mild persistent 12/19/2008    Priority: High  . Hyperlipidemia 08/12/2007    Priority: High  . Essential hypertension 08/12/2007    Priority: High  . Chronic fatigue syndrome with fibromyalgia 01/20/2016    Priority: Medium  . Depression 11/24/2015    Priority: Medium  . B12 deficiency 08/30/2015    Priority: Medium  . Iron deficiency anemia 11/03/2009    Priority: Medium  . Anxiety state 08/12/2007    Priority: Medium  . Muscle pain, fibromyalgia 08/12/2007    Priority: Medium  . Environmental and seasonal allergies 01/20/2016    Priority: Low  . Vitamin D deficiency 01/19/2016    Priority: Low  . Chronic Fatigue- 2ndary to FM and Mood d/o  06/20/2015    Priority: Low  . Memory loss     Priority: Low  . Lumbar radiculopathy, chronic     Priority: Low  . LBBB (left bundle branch block) 11/13/2010    Priority: Low  . G E R D 08/12/2007    Priority: Low  . Iron  deficiency anemia, unspecified 04/23/2017  . GAD (generalized anxiety disorder) 04/23/2017  . Insomnia 04/23/2017  . S/P right TH revision 09/24/2016  . Type 2 diabetes mellitus without complication, without long-term current use of insulin ([place]) 01/28/2016  . Medication intolerance- statins- (make her FM much W) 01/10/2016  . Reaction, situational, acute, to stress(husband dying) 09/21/2015  . Dysuria 08/18/2015  . Herpes simplex labialis 04/03/2012  . Shingles  04/12/2011  . MIGRAINE HEADACHE 05/30/2010  . Allergic rhinitis 08/12/2007  . ARTHRITIS 08/12/2007    Past Medical history, Surgical history, Family history, Social history, Allergies and Medications have been entered into the medical record, reviewed and changed as needed.    Current Meds  Medication Sig  . albuterol (PROAIR HFA) 108 (90 Base) MCG/ACT inhaler Inhale 2 puffs into the lungs every 6 (six) hours as needed.  . ALPRAZolam (XANAX) 0.25 MG tablet 1 by mouth when necessary severe anxiety  . amLODipine (NORVASC) 5 MG tablet Take 5 mg by mouth daily.  . cetirizine (ZYRTEC) 10 MG tablet Take 10 mg by mouth daily.   . chlorthalidone (HYGROTON) 25 MG tablet TAKE 1/2 TABLET BY MOUTH DAILY  . Cholecalciferol (VITAMIN D3) 5000 units TABS 5,000 IU OTC vitamin D3 daily.  . cyclobenzaprine (FLEXERIL) 5 MG tablet Take 5 mg by mouth 3 (three) times daily as needed for muscle spasms.  Marland Kitchen docusate sodium (COLACE) 100 MG capsule Take 1 capsule (100 mg total) by mouth 2 (two) times daily.  . DULoxetine (CYMBALTA) 30 MG capsule TAKE 1 CAPSULE BY MOUTH DAILY  . ferrous sulfate (FERROUSUL) 325 (65 FE) MG tablet Take 1 tablet (325 mg total) by mouth 3 (three) times daily with meals.  . fluticasone (FLONASE) 50 MCG/ACT nasal spray USE 2 SPRAYS INTO EACH NOSTRIL EVERY DAY  . HYDROcodone-acetaminophen (NORCO) 7.5-325 MG tablet Take 1-2 tablets by mouth every 4 (four) hours as needed for moderate pain or severe pain.  Marland Kitchen losartan (COZAAR) 100  MG tablet TAKE 1 TABLET BY MOUTH DAILY  . methocarbamol (ROBAXIN) 500 MG tablet Take 1 tablet (500 mg total) by mouth every 6 (six) hours as needed for muscle spasms.  . montelukast (SINGULAIR) 10 MG tablet TAKE 1 TABLET BY MOUTH EACH NIGHT AT BEDTIME  . omeprazole (PRILOSEC) 20 MG capsule TAKE 1 CAPSULE BY MOUTH DAILY  . polyethylene glycol (MIRALAX / GLYCOLAX) packet Take 17 g by mouth 2 (two) times daily.  . sertraline (ZOLOFT) 100 MG tablet 1 p.o. daily  . SYMBICORT 80-4.5 MCG/ACT inhaler INHALE 2 PUFFS BY MOUTH TWICE DAILY  . traMADol (ULTRAM) 50 MG tablet Take 1-2 tablets (50-100 mg total) by mouth every 6 (six) hours as needed (mild to moderate pain).  . vitamin B-12 (CYANOCOBALAMIN) 1000 MCG tablet Take 1 tablet (1,000 mcg total) by mouth daily.  . [DISCONTINUED] sertraline (ZOLOFT) 50 MG tablet 1 p.o. daily    Allergies:  Allergies  Allergen Reactions  . Morphine Sulfate Other (See Comments)    Makes her hyper     Review of Systems:  A fourteen system review of systems was performed and found to be positive as per HPI.   Objective:   Blood pressure 136/80, pulse 79, height 5\' 4"  (1.626 m), weight 140 lb 11.2 oz (63.8 kg), SpO2 99 %. Body mass index is 24.15 kg/m. General:  Well Developed, well nourished, appropriate for stated age.  Neuro:  Alert and oriented,  extra-ocular muscles intact  HEENT:  Normocephalic, atraumatic, neck supple, no carotid bruits appreciated  Skin:  no gross rash, warm, pink. Cardiac:  RRR, S1 S2 Respiratory:  ECTA B/L and A/P, Not using accessory muscles, speaking in full sentences- unlabored. Vascular:  Ext warm, no cyanosis apprec.; cap RF less 2 sec. Psych:  No HI/SI, judgement and insight good, Euthymic mood. Full Affect.

## 2017-04-24 ENCOUNTER — Encounter: Payer: Self-pay | Admitting: Family Medicine

## 2017-04-24 LAB — CBC WITH DIFFERENTIAL/PLATELET
BASOS: 0 %
Basophils Absolute: 0 10*3/uL (ref 0.0–0.2)
EOS (ABSOLUTE): 0.1 10*3/uL (ref 0.0–0.4)
EOS: 1 %
Hematocrit: 40.4 % (ref 34.0–46.6)
Hemoglobin: 13.5 g/dL (ref 11.1–15.9)
IMMATURE GRANS (ABS): 0 10*3/uL (ref 0.0–0.1)
IMMATURE GRANULOCYTES: 0 %
LYMPHS: 21 %
Lymphocytes Absolute: 1.3 10*3/uL (ref 0.7–3.1)
MCH: 30.1 pg (ref 26.6–33.0)
MCHC: 33.4 g/dL (ref 31.5–35.7)
MCV: 90 fL (ref 79–97)
Monocytes Absolute: 0.5 10*3/uL (ref 0.1–0.9)
Monocytes: 9 %
Neutrophils Absolute: 4.3 10*3/uL (ref 1.4–7.0)
Neutrophils: 69 %
PLATELETS: 216 10*3/uL (ref 150–379)
RBC: 4.48 x10E6/uL (ref 3.77–5.28)
RDW: 15.2 % (ref 12.3–15.4)
WBC: 6.2 10*3/uL (ref 3.4–10.8)

## 2017-04-24 LAB — IRON AND TIBC
Iron Saturation: 24 % (ref 15–55)
Iron: 66 ug/dL (ref 27–139)
Total Iron Binding Capacity: 278 ug/dL (ref 250–450)
UIBC: 212 ug/dL (ref 118–369)

## 2017-04-24 LAB — FERRITIN: Ferritin: 163 ng/mL — ABNORMAL HIGH (ref 15–150)

## 2017-04-24 LAB — FOLATE: FOLATE: 8 ng/mL (ref >3.0)

## 2017-04-24 LAB — VITAMIN B12: VITAMIN B 12: 1071 pg/mL (ref 232–1245)

## 2017-04-24 LAB — VITAMIN D 25 HYDROXY (VIT D DEFICIENCY, FRACTURES): Vit D, 25-Hydroxy: 116 ng/mL — ABNORMAL HIGH (ref 30.0–100.0)

## 2017-04-29 ENCOUNTER — Telehealth: Payer: Self-pay | Admitting: Family Medicine

## 2017-04-29 NOTE — Telephone Encounter (Signed)
This is done.

## 2017-04-29 NOTE — Telephone Encounter (Signed)
Can you please review labs for the patient. MPulliam, CMA/RT(R)

## 2017-04-29 NOTE — Telephone Encounter (Signed)
Patient is waiting for lab results

## 2017-04-30 DIAGNOSIS — M545 Low back pain: Secondary | ICD-10-CM | POA: Diagnosis not present

## 2017-04-30 DIAGNOSIS — Z96641 Presence of right artificial hip joint: Secondary | ICD-10-CM | POA: Diagnosis not present

## 2017-05-07 ENCOUNTER — Other Ambulatory Visit: Payer: Self-pay

## 2017-05-07 DIAGNOSIS — M79676 Pain in unspecified toe(s): Secondary | ICD-10-CM

## 2017-05-12 DIAGNOSIS — Z96641 Presence of right artificial hip joint: Secondary | ICD-10-CM | POA: Diagnosis not present

## 2017-05-12 DIAGNOSIS — R531 Weakness: Secondary | ICD-10-CM | POA: Diagnosis not present

## 2017-05-12 DIAGNOSIS — S76311D Strain of muscle, fascia and tendon of the posterior muscle group at thigh level, right thigh, subsequent encounter: Secondary | ICD-10-CM | POA: Diagnosis not present

## 2017-05-16 DIAGNOSIS — Z96641 Presence of right artificial hip joint: Secondary | ICD-10-CM | POA: Diagnosis not present

## 2017-05-16 DIAGNOSIS — S76311D Strain of muscle, fascia and tendon of the posterior muscle group at thigh level, right thigh, subsequent encounter: Secondary | ICD-10-CM | POA: Diagnosis not present

## 2017-05-16 DIAGNOSIS — R531 Weakness: Secondary | ICD-10-CM | POA: Diagnosis not present

## 2017-05-19 ENCOUNTER — Other Ambulatory Visit: Payer: Self-pay | Admitting: Family Medicine

## 2017-05-19 ENCOUNTER — Other Ambulatory Visit: Payer: Self-pay | Admitting: Pulmonary Disease

## 2017-05-22 ENCOUNTER — Ambulatory Visit (HOSPITAL_COMMUNITY)
Admission: RE | Admit: 2017-05-22 | Discharge: 2017-05-22 | Disposition: A | Discharge status: Home / Self Care | Payer: Medicare Other | Source: Ambulatory Visit | Attending: Vascular Surgery | Admitting: Vascular Surgery

## 2017-05-22 DIAGNOSIS — M79676 Pain in unspecified toe(s): Secondary | ICD-10-CM | POA: Diagnosis not present

## 2017-05-23 DIAGNOSIS — S76311D Strain of muscle, fascia and tendon of the posterior muscle group at thigh level, right thigh, subsequent encounter: Secondary | ICD-10-CM | POA: Diagnosis not present

## 2017-05-23 DIAGNOSIS — Z96641 Presence of right artificial hip joint: Secondary | ICD-10-CM | POA: Diagnosis not present

## 2017-05-23 DIAGNOSIS — R531 Weakness: Secondary | ICD-10-CM | POA: Diagnosis not present

## 2017-05-26 ENCOUNTER — Other Ambulatory Visit: Payer: Self-pay

## 2017-05-26 ENCOUNTER — Ambulatory Visit (INDEPENDENT_AMBULATORY_CARE_PROVIDER_SITE_OTHER): Payer: Medicare Other | Admitting: Vascular Surgery

## 2017-05-26 ENCOUNTER — Encounter: Payer: Self-pay | Admitting: Vascular Surgery

## 2017-05-26 VITALS — BP 145/79 | HR 78 | Temp 97.2°F | Resp 16 | Ht 64.0 in | Wt 141.0 lb

## 2017-05-26 DIAGNOSIS — M79604 Pain in right leg: Secondary | ICD-10-CM

## 2017-05-26 NOTE — Progress Notes (Signed)
Referring Physician: Dr Adriana Mccallum  Patient name: [name] MRN: [id number] DOB: 1933/12/20 Sex: female  REASON FOR CONSULT: Right hip and leg pain with ambulation  HPI: [name] is a 84 y.o. female who complains of weakness in her right leg especially the hip.  She does not really describe claudication symptoms in either calf.  She is able to walk reasonably well with the assistance of a cane.  However without the cane she is fairly unstable with her gait.  The right leg tends to give way.  She denies any nonhealing wounds on her feet.  Her primary atherosclerotic risk factors are a remote history of tobacco abuse hyperlipidemia and hypertension.  These are currently controlled.  States that her symptoms have been present since having revision of a total hip about 8 months ago.  Past Medical History:  Diagnosis Date  . ALLERGIC RHINITIS   . ANEMIA, IRON DEFICIENCY   . ANXIETY   . ARTHRITIS    s/p bilateral THR  . ASTHMA, EXTRINSIC   . Bronchitis   . Cataracts, both eyes   . Chronic constipation   . FIBROMYALGIA    fibromyalgia  . G E R D   . High cholesterol   . HYPERLIPIDEMIA   . HYPERTENSION   . Interstitial cystitis   . MIGRAINE HEADACHE    Past Surgical History:  Procedure Laterality Date  . ABDOMINAL HYSTERECTOMY    . ANTERIOR HIP REVISION Right 09/24/2016   Procedure: RIGHT HIP ACETABULUM AND FEMORAL HEAD REVISION WITH BONE GRAFT;  Surgeon: Paralee Cancel, MD;  Location: [facility];  Service: Orthopedics;  Laterality: Right;  . APPENDECTOMY    . BACK SURGERY    . CATARACT EXTRACTION    . COLONOSCOPY    . DOPPLER ECHOCARDIOGRAPHY    . ESOPHAGOGASTRODUODENOSCOPY ENDOSCOPY    . LUMBAR DISC SURGERY    . NM MYOVIEW LTD     stress test  . Ovarian cyst removed    . ROTATOR CUFF REPAIR Left 5-6 years ago   Dr Onnie Graham; done at [facility] center   . TOTAL HIP ARTHROPLASTY      Family History  Problem Relation Age of Onset  . Arthritis Mother   . Hypertension  Mother   . Heart disease Father   . Arthritis Father   . Hypertension Father   . Depression Sister   . Hyperlipidemia Sister   . Hypertension Other   . Hyperlipidemia Other     SOCIAL HISTORY: Social History   Socioeconomic History  . Marital status: Married    Spouse name: Not on file  . Number of children: Not on file  . Years of education: Not on file  . Highest education level: Not on file  Social Needs  . Financial resource strain: Not on file  . Food insecurity - worry: Not on file  . Food insecurity - inability: Not on file  . Transportation needs - medical: Not on file  . Transportation needs - non-medical: Not on file  Occupational History  . Not on file  Tobacco Use  . Smoking status: Former Smoker    Packs/day: 0.30    Years: 10.00    Pack years: 3.00    Types: Cigarettes    Last attempt to quit: 03/25/1968    Years since quitting: 49.2  . Smokeless tobacco: Never Used  . 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 Retired Visual merchandiser and Sexual Activity  . Alcohol use:  No    Alcohol/week: 0.0 oz  . Drug use: No  . Sexual activity: Not Currently  Other Topics Concern  . Not on file  Social History Narrative   Lives with husband in a 3 story home.  Has 2 children.  Retired Glass blower/designer.  Education: Scientist, water quality.    Allergies  Allergen Reactions  . Morphine Sulfate Other (See Comments)    Makes her hyper    Current Outpatient Medications  Medication Sig Dispense Refill  . albuterol (PROAIR HFA) 108 (90 Base) MCG/ACT inhaler Inhale 2 puffs into the lungs every 6 (six) hours as needed. 1 Inhaler 5  . ALPRAZolam (XANAX) 0.25 MG tablet 1 by mouth when necessary severe anxiety 30 tablet 0  . amLODipine (NORVASC) 5 MG tablet Take 5 mg by mouth daily.    . cetirizine (ZYRTEC) 10 MG tablet Take 10 mg by mouth daily.     . chlorthalidone (HYGROTON) 25 MG tablet TAKE 1/2 TABLET BY MOUTH DAILY 15 tablet 3  . Cholecalciferol (VITAMIN D3) 5000 units  TABS 5,000 IU OTC vitamin D3 daily. 90 tablet 3  . cyclobenzaprine (FLEXERIL) 5 MG tablet Take 5 mg by mouth 3 (three) times daily as needed for muscle spasms.    Marland Kitchen docusate sodium (COLACE) 100 MG capsule Take 1 capsule (100 mg total) by mouth 2 (two) times daily. 10 capsule 0  . DULoxetine (CYMBALTA) 30 MG capsule TAKE 1 CAPSULE BY MOUTH DAILY 90 capsule 1  . ferrous sulfate (FERROUSUL) 325 (65 FE) MG tablet Take 1 tablet (325 mg total) by mouth 3 (three) times daily with meals.    . fluticasone (FLONASE) 50 MCG/ACT nasal spray USE 2 SPRAYS INTO EACH NOSTRIL EVERY DAY 48 g 3  . HYDROcodone-acetaminophen (NORCO) 7.5-325 MG tablet Take 1-2 tablets by mouth every 4 (four) hours as needed for moderate pain or severe pain. 40 tablet 0  . losartan (COZAAR) 100 MG tablet TAKE 1 TABLET BY MOUTH DAILY 90 tablet 0  . methocarbamol (ROBAXIN) 500 MG tablet Take 1 tablet (500 mg total) by mouth every 6 (six) hours as needed for muscle spasms. 40 tablet 0  . montelukast (SINGULAIR) 10 MG tablet TAKE 1 TABLET BY MOUTH EACH NIGHT AT BEDTIME 90 tablet 0  . omeprazole (PRILOSEC) 20 MG capsule TAKE 1 CAPSULE BY MOUTH DAILY 90 capsule 1  . polyethylene glycol (MIRALAX / GLYCOLAX) packet Take 17 g by mouth 2 (two) times daily. 14 each 0  . sertraline (ZOLOFT) 100 MG tablet 1 p.o. daily 90 tablet 1  . SYMBICORT 80-4.5 MCG/ACT inhaler INHALE 2 PUFFS BY MOUTH TWICE DAILY 10.2 g 5  . traMADol (ULTRAM) 50 MG tablet Take 1-2 tablets (50-100 mg total) by mouth every 6 (six) hours as needed (mild to moderate pain). 30 tablet 0  . vitamin B-12 (CYANOCOBALAMIN) 1000 MCG tablet Take 1 tablet (1,000 mcg total) by mouth daily. 90 tablet 3   No current facility-administered medications for this visit.     ROS:   General:  No weight loss, Fever, chills  HEENT: No recent headaches, no nasal bleeding, no visual changes, no sore throat  Neurologic: No dizziness, blackouts, seizures. No recent symptoms of stroke or mini- stroke.  No recent episodes of slurred speech, or temporary blindness.  Cardiac: No recent episodes of chest pain/pressure, no shortness of breath at rest.  No shortness of breath with exertion.  Denies history of atrial fibrillation or irregular heartbeat  Vascular: No history of rest pain in feet.  No history of claudication.  No history of non-healing ulcer, No history of DVT   Pulmonary: No home oxygen, no productive cough, no hemoptysis,  No asthma or wheezing  Musculoskeletal:  [X]  Arthritis, [ ]  Low back pain,  [X]  Joint pain  Hematologic:No history of hypercoagulable state.  No history of easy bleeding.  No history of anemia  Gastrointestinal: No hematochezia or melena,  No gastroesophageal reflux, no trouble swallowing  Urinary: [ ]  chronic Kidney disease, [ ]  on HD - [ ]  MWF or [ ]  TTHS, [ ]  Burning with urination, [ ]  Frequent urination, [ ]  Difficulty urinating;   Skin: No rashes  Psychological: No history of anxiety,  No history of depression   Physical Examination  Vitals:   05/26/17 1050  BP: (!) 145/79  Pulse: 78  Resp: 16  Temp: (!) 97.2 F (36.2 C)  TempSrc: Oral  SpO2: 98%  Weight: 141 lb (64 kg)  Height: 5\' 4"  (1.626 m)    Body mass index is 24.2 kg/m.  General:  Alert and oriented, no acute distress HEENT: Normal Neck: No bruit or JVD Pulmonary: Clear to auscultation bilaterally Cardiac: Regular Rate and Rhythm without murmur Abdomen: Soft, non-tender, non-distended, no mass Skin: No rash, few scattered spider type varicosities Extremity Pulses:  2+ radial, brachial, femoral, dorsalis pedis, absent posterior tibial pulses bilaterally Musculoskeletal: No deformity or edema  Neurologic: Upper and lower extremity motor 5/5 and symmetric, walks with slight limp which is improved with use of a cane in her left hand  DATA:  Patient had bilateral ABIs performed which I reviewed today.  These were done on May 22, 2017.  ABIs were greater than 1 and  triphasic bilaterally.  ASSESSMENT: No evidence of significant lower extremity arterial occlusive disease to explain her current symptoms.  Although she does have some spider type varicosities in both lower extremities again even if she had significant varicose veins or venous reflux this would not really cause the type of symptoms she is describing.   PLAN: Pt will follow up with me on an as-needed basis.   Ruta Hinds, MD [facility] Office: [phone] Pager: [phone]

## 2017-05-27 DIAGNOSIS — S76311D Strain of muscle, fascia and tendon of the posterior muscle group at thigh level, right thigh, subsequent encounter: Secondary | ICD-10-CM | POA: Diagnosis not present

## 2017-05-27 DIAGNOSIS — R531 Weakness: Secondary | ICD-10-CM | POA: Diagnosis not present

## 2017-05-27 DIAGNOSIS — Z96641 Presence of right artificial hip joint: Secondary | ICD-10-CM | POA: Diagnosis not present

## 2017-05-29 DIAGNOSIS — Z96641 Presence of right artificial hip joint: Secondary | ICD-10-CM | POA: Diagnosis not present

## 2017-05-29 DIAGNOSIS — S76311D Strain of muscle, fascia and tendon of the posterior muscle group at thigh level, right thigh, subsequent encounter: Secondary | ICD-10-CM | POA: Diagnosis not present

## 2017-05-29 DIAGNOSIS — R531 Weakness: Secondary | ICD-10-CM | POA: Diagnosis not present

## 2017-06-02 ENCOUNTER — Encounter: Payer: Self-pay | Admitting: Family Medicine

## 2017-06-02 ENCOUNTER — Ambulatory Visit (INDEPENDENT_AMBULATORY_CARE_PROVIDER_SITE_OTHER): Payer: Medicare Other | Admitting: Family Medicine

## 2017-06-02 VITALS — BP 137/79 | HR 83 | Ht 64.0 in | Wt 140.0 lb

## 2017-06-02 DIAGNOSIS — R531 Weakness: Secondary | ICD-10-CM | POA: Diagnosis not present

## 2017-06-02 DIAGNOSIS — Z96641 Presence of right artificial hip joint: Secondary | ICD-10-CM | POA: Diagnosis not present

## 2017-06-02 DIAGNOSIS — F4323 Adjustment disorder with mixed anxiety and depressed mood: Secondary | ICD-10-CM

## 2017-06-02 DIAGNOSIS — R7303 Prediabetes: Secondary | ICD-10-CM

## 2017-06-02 DIAGNOSIS — I7300 Raynaud's syndrome without gangrene: Secondary | ICD-10-CM | POA: Diagnosis not present

## 2017-06-02 DIAGNOSIS — I10 Essential (primary) hypertension: Secondary | ICD-10-CM

## 2017-06-02 DIAGNOSIS — S76311D Strain of muscle, fascia and tendon of the posterior muscle group at thigh level, right thigh, subsequent encounter: Secondary | ICD-10-CM | POA: Diagnosis not present

## 2017-06-02 DIAGNOSIS — F411 Generalized anxiety disorder: Secondary | ICD-10-CM | POA: Diagnosis not present

## 2017-06-02 HISTORY — DX: Raynaud's syndrome without gangrene: I73.00

## 2017-06-02 LAB — POCT GLYCOSYLATED HEMOGLOBIN (HGB A1C): HEMOGLOBIN A1C: 5.8

## 2017-06-02 MED ORDER — BUSPIRONE HCL 15 MG PO TABS: 7.5000 mg | ORAL_TABLET | Freq: Three times a day (TID) | ORAL | 5 refills | Status: DC

## 2017-06-02 MED ORDER — ALPRAZOLAM 0.25 MG PO TABS: ORAL_TABLET | ORAL | 0 refills | Status: DC

## 2017-06-02 NOTE — Progress Notes (Signed)
Impression and Recommendations:    1. Adjustment disorder with mixed anxiety and depressed mood   2. Anxiety state   3. Primary [name] disease without gangrene-  hands and feet   4. Prediabetes- 6.0 12/17   5. Essential hypertension     1. Prediabetes: A1c was 6.0 was 12/17. A1c today is 5.8.  2. Essential HTN: BP well-controlled. Sx stable. Continue taking meds as listed below.  3. adjustment disorder -start buspar to which pt agreed. Our goal is to reduce her xanax usage and to replace this with around-the-clock use of buspar. -explained to pt it takes many weeks, 8-10, before the drug's effects start to kick in. -focus on things you are most grateful for instead of negative things in your life.  4. Anxiety state  -refill xanax, only take this PRN for times of panic. -Pt is tolerating her meds well and sx stable, despite her feeling no change. Continue meds as listed below. Explained to pt that it has not quite been 6 weeks since we started her on this med increase, that it might start working better for her.   5. [name] disease- Handout and information provided. Daughter with same condition. Pt with very mild symptoms at this time. Will continue to monitor.    Education and routine counseling performed. Handouts provided.  Orders Placed This Encounter  Procedures  . POCT glycosylated hemoglobin (Hb A1C)    Meds ordered this encounter  Medications  . busPIRone (BUSPAR) 15 MG tablet    Sig: Take 0.5 tablets (7.5 mg total) by mouth 3 (three) times daily.    Dispense:  90 tablet    Refill:  5  . ALPRAZolam (XANAX) 0.25 MG tablet    Sig: 1 by mouth only PRN severe panic not controlled via other methods    Dispense:  30 tablet    Refill:  0    Gross side effects, risk and benefits, and alternatives of medications and treatment plan in general discussed with patient.  Patient is aware that all medications have potential side effects and we are unable to predict  every side effect or drug-drug interaction that may occur.   Patient will call with any questions prior to using medication if they have concerns.  Expresses verbal understanding and consents to current therapy and treatment regimen.  No barriers to understanding were identified.  Red flag symptoms and signs discussed in detail.  Patient expressed understanding regarding what to do in case of emergency\urgent symptoms  Please see AVS handed out to patient at the end of our visit for further patient instructions/ counseling done pertaining to today's office visit.   Return Follow-up in 8-12 weeks since starting BuSpar, for O/W Follow-up chronic care q. 4 months.     Note: This document was prepared using Dragon voice recognition software and may include unintentional dictation errors.   This document serves as a record of services personally performed by [name], DO. It was created on her behalf by Mayer Masker, a trained medical scribe. The creation of this record is based on the scribe's personal observations and the provider's statements to them.   I have reviewed the above medical documentation for accuracy and completeness and I concur.  [name] 06/02/17 2:41 PM  ----------------------------------------------------------------------------------------------------------------------------------------------------------------------------------------------------------------------------  Subjective:    CC:  Chief Complaint  Patient presents with  . Follow-up    HPI: [name] is a 84 y.o. female who presents to [facility] at  [facility] today for follow-up of mood.   Mood: From last OV, we increased zoloft from 50mg  to 100mg . She does not notice any difference- she still feels she is struggling with being disappointed and frustrated with s/p her R hip surgery. She also reports having stresses around the house, like having to replacing her septic tank  and having water pumped out of her yard. She also has inability to physically do things because of her hip and this depresses her mood and gives her anxiety. She also feels her mood is being affected because of her financial issues.   She is taking cymbalta 100mg  daily, but she can't tell any difference in this medication.   Pt has never been on buspar before.   Feet/hands  About 3 months ago, her feet started hurting bilaterally through the ball of her foot and into her toes. She states she took her shoes off and her feet were black. She asked her orthopedist, Dr. Alvan Dame, who referred him to Dr. Oneida Alar, vascular surgeon. She also states she has color change in her hands, but this is not as bad as it is in her feet. She states she has swelling in her feet and she has pain when she wakes up and steps on the floor. Her daughter has a h/o [name]. Pt has a rheumatologist, Dr. Estanislado Pandy, but she has not seen them in a while.     Depression screen [facility] 2/9 06/02/2017 04/23/2017 01/21/2017  Decreased Interest 1 0 0  Down, Depressed, Hopeless 1 1 1   PHQ - 2 Score 2 1 1   Altered sleeping 0 0 1  Tired, decreased energy 1 1 1   Change in appetite 0 0 0  Feeling bad or failure about yourself  1 1 0  Trouble concentrating 0 0 0  Moving slowly or fidgety/restless 0 0 0  Suicidal thoughts 0 0 0  PHQ-9 Score 4 3 3   Difficult doing work/chores - Not difficult at all Not difficult at all  Some recent data might be hidden     GAD 7 : Generalized Anxiety Score 06/02/2017 04/23/2017 01/07/2017 01/11/2016  Nervous, Anxious, on Edge 3 1 2 3   Control/stop worrying 3 3 2 3   Worry too much - different things 2 1 1 3   Trouble relaxing 3 3 2 3   Restless 0 0 1 0  Easily annoyed or irritable 2 0 2 1  Afraid - awful might happen 0 0 0 0  Total GAD 7 Score 13 8 10 13   Anxiety Difficulty - - Somewhat difficult Very difficult     Wt Readings from Last 3 Encounters:  06/02/17 140 lb (63.5 kg)  05/26/17 141 lb (64  kg)  04/23/17 140 lb 11.2 oz (63.8 kg)   BP Readings from Last 3 Encounters:  06/02/17 137/79  05/26/17 (!) 145/79  04/23/17 136/80   Pulse Readings from Last 3 Encounters:  06/02/17 83  05/26/17 78  04/23/17 79   BMI Readings from Last 3 Encounters:  06/02/17 24.03 kg/m  05/26/17 24.20 kg/m  04/23/17 24.15 kg/m         Patient Care Team    Relationship Specialty Notifications Start End  [name], DO PCP - General Family Medicine  08/30/15   Ladene Artist, MD Referring Physician Gastroenterology  07/02/10   Myrlene Broker, MD Referring Physician Urology  07/02/10   Clent Jacks, MD Referring Physician Ophthalmology  07/02/10   Latanya Maudlin, MD Referring Physician Orthopedic Surgery  07/02/10  [name], MD Referring Physician Cardiology  07/02/10   Danella Sensing, MD Consulting Physician Dermatology  11/09/15   Juanito Doom, MD Consulting Physician Pulmonary Disease  11/09/15   Suella Broad, MD Consulting Physician Physical Medicine and Rehabilitation  11/09/15    Comment: pain mgt :  injections and pain meds  Rockne Menghini, RPH-CPP Pharmacist Cardiology  11/19/15    Comment:  " [facility]" - med mgt of her HTN per request of Dr Alvester Chou- Cards  Alda Berthold, DO Consulting Physician Neurology  12/17/15   Harrington Challenger, [facility]  Pharmacist  07/29/16    Comment: clinical pharm-D in HTN clinic     Patient Active Problem List   Diagnosis Date Noted  . Medication refused- (any cholesterol ones) 01/20/2016    Priority: High  . Prediabetes- 6.0 12/17 01/11/2016    Priority: High  . Adjustment disorder with mixed anxiety and depressed mood 01/11/2016    Priority: High  . Asthma, mild persistent 12/19/2008    Priority: High  . Hyperlipidemia 08/12/2007    Priority: High  . Essential hypertension 08/12/2007    Priority: High  . Chronic fatigue syndrome with fibromyalgia 01/20/2016    Priority: Medium  . Depression 11/24/2015     Priority: Medium  . B12 deficiency 08/30/2015    Priority: Medium  . Iron deficiency anemia 11/03/2009    Priority: Medium  . Anxiety state 08/12/2007    Priority: Medium  . Muscle pain, fibromyalgia 08/12/2007    Priority: Medium  . Environmental and seasonal allergies 01/20/2016    Priority: Low  . Vitamin D deficiency 01/19/2016    Priority: Low  . Chronic Fatigue- 2ndary to FM and Mood d/o  06/20/2015    Priority: Low  . Memory loss     Priority: Low  . Lumbar radiculopathy, chronic     Priority: Low  . LBBB (left bundle branch block) 11/13/2010    Priority: Low  . G E R D 08/12/2007    Priority: Low  . Primary [name] disease without gangrene-  hands and feet 06/02/2017  . Iron deficiency anemia, unspecified 04/23/2017  . GAD (generalized anxiety disorder) 04/23/2017  . Insomnia 04/23/2017  . S/P right TH revision 09/24/2016  . Type 2 diabetes mellitus without complication, without long-term current use of insulin ([place]) 01/28/2016  . Medication intolerance- statins- (make her FM much W) 01/10/2016  . Reaction, situational, acute, to stress(husband dying) 09/21/2015  . Dysuria 08/18/2015  . Herpes simplex labialis 04/03/2012  . Shingles 04/12/2011  . MIGRAINE HEADACHE 05/30/2010  . Allergic rhinitis 08/12/2007  . ARTHRITIS 08/12/2007    Past Medical history, Surgical history, Family history, Social history, Allergies and Medications have been entered into the medical record, reviewed and changed as needed.    Current Meds  Medication Sig  . albuterol (PROAIR HFA) 108 (90 Base) MCG/ACT inhaler Inhale 2 puffs into the lungs every 6 (six) hours as needed.  . ALPRAZolam (XANAX) 0.25 MG tablet 1 by mouth only PRN severe panic not controlled via other methods  . amLODipine (NORVASC) 5 MG tablet Take 5 mg by mouth daily.  . cetirizine (ZYRTEC) 10 MG tablet Take 10 mg by mouth daily.   . chlorthalidone (HYGROTON) 25 MG tablet TAKE 1/2 TABLET BY MOUTH DAILY  .  Cholecalciferol (VITAMIN D3) 5000 units TABS 5,000 IU OTC vitamin D3 daily.  . cyclobenzaprine (FLEXERIL) 5 MG tablet Take 5 mg by mouth 3 (three) times daily as needed for muscle spasms.  Marland Kitchen  docusate sodium (COLACE) 100 MG capsule Take 1 capsule (100 mg total) by mouth 2 (two) times daily.  . DULoxetine (CYMBALTA) 30 MG capsule TAKE 1 CAPSULE BY MOUTH DAILY  . ferrous sulfate (FERROUSUL) 325 (65 FE) MG tablet Take 1 tablet (325 mg total) by mouth 3 (three) times daily with meals.  . fluticasone (FLONASE) 50 MCG/ACT nasal spray USE 2 SPRAYS INTO EACH NOSTRIL EVERY DAY  . HYDROcodone-acetaminophen (NORCO) 7.5-325 MG tablet Take 1-2 tablets by mouth every 4 (four) hours as needed for moderate pain or severe pain.  Marland Kitchen losartan (COZAAR) 100 MG tablet TAKE 1 TABLET BY MOUTH DAILY  . montelukast (SINGULAIR) 10 MG tablet TAKE 1 TABLET BY MOUTH EACH NIGHT AT BEDTIME  . omeprazole (PRILOSEC) 20 MG capsule TAKE 1 CAPSULE BY MOUTH DAILY  . polyethylene glycol (MIRALAX / GLYCOLAX) packet Take 17 g by mouth 2 (two) times daily.  . sertraline (ZOLOFT) 100 MG tablet 1 p.o. daily  . SYMBICORT 80-4.5 MCG/ACT inhaler INHALE 2 PUFFS BY MOUTH TWICE DAILY  . traMADol (ULTRAM) 50 MG tablet Take 1-2 tablets (50-100 mg total) by mouth every 6 (six) hours as needed (mild to moderate pain).  . vitamin B-12 (CYANOCOBALAMIN) 1000 MCG tablet Take 1 tablet (1,000 mcg total) by mouth daily.  . [DISCONTINUED] ALPRAZolam (XANAX) 0.25 MG tablet 1 by mouth when necessary severe anxiety    Allergies:  Allergies  Allergen Reactions  . Morphine Sulfate Other (See Comments)    Makes her hyper     Review of Systems: Review of Systems: General:   No F/C, wt loss Pulm:   No DIB, SOB, pleuritic chest pain Card:  No CP, palpitations Abd:  No n/v/d or pain Ext:  No inc edema from baseline Psych: no SI/ HI    Objective:   Blood pressure 137/79, pulse 83, height 5\' 4"  (1.626 m), weight 140 lb (63.5 kg), SpO2 98 %. Body mass  index is 24.03 kg/m. General:  Well Developed, well nourished, appropriate for stated age.  Neuro:  Alert and oriented,  extra-ocular muscles intact  HEENT:  Normocephalic, atraumatic, neck supple, no carotid bruits appreciated  Skin:  no gross rash, warm. Bluish, purplish hue to bottom of feet bilaterally in toes, hands, erythematous in palms bilaterally.  Cardiac:  RRR, S1 S2 Respiratory:  ECTA B/L and A/P, Not using accessory muscles, speaking in full sentences- unlabored. Vascular:  Ext warm, no cyanosis apprec.; cap RF less 2 sec. Psych:  No HI/SI, judgement and insight good, Euthymic mood. Full Affect.

## 2017-06-02 NOTE — Patient Instructions (Addendum)
A1c was 5.8 today.  This is improved from prior.  Cont prudent diet  -Please see handout below on prediabetes  -Please see printed handout I gave you on Raynods syndrome-hears the link below [email or web address]    Risk factors for prediabetes and type 2 diabetes  Researchers don't fully understand why some people develop prediabetes and type 2 diabetes and others don't.  It's clear that certain factors increase the risk, however, including:  Weight. The more fatty tissue you have, the more resistant your cells become to insulin.  Inactivity. The less active you are, the greater your risk. Physical activity helps you control your weight, uses up glucose as energy and makes your cells more sensitive to insulin.  Family history. Your risk increases if a parent or sibling has type 2 diabetes.  Race. Although it's unclear why, people of certain races - including blacks, Hispanics, American Indians and Asian-Americans - are at higher risk.  Age. Your risk increases as you get older. This may be because you tend to exercise less, lose muscle mass and gain weight as you age. But type 2 diabetes is also increasing dramatically among children, adolescents and younger adults.  Gestational diabetes. If you developed gestational diabetes when you were pregnant, your risk of developing prediabetes and type 2 diabetes later increases. If you gave birth to a baby weighing more than 9 pounds (4 kilograms), you're also at risk of type 2 diabetes.  Polycystic ovary syndrome. For women, having polycystic ovary syndrome - a common condition characterized by irregular menstrual periods, excess hair growth and obesity - increases the risk of diabetes.  High blood pressure. Having blood pressure over 140/90 millimeters of mercury (mm Hg) is linked to an increased risk of type 2 diabetes.  Abnormal cholesterol and triglyceride levels. If you have low  levels of high-density lipoprotein (HDL), or "good," cholesterol, your risk of type 2 diabetes is higher. Triglycerides are another type of fat carried in the blood. People with high levels of triglycerides have an increased risk of type 2 diabetes. Your doctor can let you know what your cholesterol and triglyceride levels are.  A good guide to good carbs: The glycemic index ---If you have diabetes, or at risk for diabetes, you know all too well that when you eat carbohydrates, your blood sugar goes up. The total amount of carbs you consume at a meal or in a snack mostly determines what your blood sugar will do. But the food itself also plays a role. A serving of white rice has almost the same effect as eating pure table sugar - a quick, high spike in blood sugar. A serving of lentils has a slower, smaller effect.  ---Picking good sources of carbs can help you control your blood sugar and your weight. Even if you don't have diabetes, eating healthier carbohydrate-rich foods can help ward off a host of chronic conditions, from heart disease to various cancers to, well, diabetes.  ---One way to choose foods is with the glycemic index (GI). This tool measures how much a food boosts blood sugar.  The glycemic index rates the effect of a specific amount of a food on blood sugar compared with the same amount of pure glucose. A food with a glycemic index of 28 boosts blood sugar only 28% as much as pure glucose. One with a GI of 95 acts like pure glucose.    High glycemic foods result in a quick spike in insulin and blood sugar (also known as blood glucose).  Low glycemic foods have a slower, smaller effect- these are healthier for you.   Using the glycemic index Using the glycemic index is easy: choose foods in the low GI category instead of those in the high GI category (see below), and go easy on those in between. Low glycemic index (GI of 55 or less): Most fruits and vegetables, beans, minimally  processed grains, pasta, low-fat dairy foods, and nuts.  Moderate glycemic index (GI 56 to 69): White and sweet potatoes, corn, white rice, couscous, breakfast cereals such as Cream of Wheat and Mini Wheats.  High glycemic index (GI of 70 or higher): White bread, rice cakes, most crackers, bagels, cakes, doughnuts, croissants, most packaged breakfast cereals. You can see the values for 100 commons foods and get links to more at [email or web address].si.  Swaps for lowering glycemic index  Instead of this high-glycemic index food Eat this lower-glycemic index food  White rice Brown rice or converted rice  Instant oatmeal Steel-cut oats  Cornflakes Bran flakes  Baked potato Pasta, bulgur  White bread Whole-grain bread  Corn Peas or leafy greens       Prediabetes Eating Plan  Prediabetes--also called impaired glucose tolerance or impaired fasting glucose--is a condition that causes blood sugar (blood glucose) levels to be higher than normal. Following a healthy diet can help to keep prediabetes under control. It can also help to lower the risk of type 2 diabetes and heart disease, which are increased in people who have prediabetes. Along with regular exercise, a healthy diet:  Promotes weight loss.  Helps to control blood sugar levels.  Helps to improve the way that the body uses insulin.   WHAT DO I NEED TO KNOW ABOUT THIS EATING PLAN?   Use the glycemic index (GI) to plan your meals. The index tells you how quickly a food will raise your blood sugar. Choose low-GI foods. These foods take a longer time to raise blood sugar.  Pay close attention to the amount of carbohydrates in the food that you eat. Carbohydrates increase blood sugar levels.  Keep track of how many calories you take in. Eating the right amount of calories will help you to achieve a healthy weight. Losing about 7 percent of your starting weight can help to prevent type 2 diabetes.  You may want to follow a  Mediterranean diet. This diet includes a lot of vegetables, lean meats or fish, whole grains, fruits, and healthy oils and fats.   WHAT FOODS CAN I EAT?  Grains Whole grains, such as whole-wheat or whole-grain breads, crackers, cereals, and pasta. Unsweetened oatmeal. Bulgur. Barley. Quinoa. Brown rice. Corn or whole-wheat flour tortillas or taco shells. Vegetables Lettuce. Spinach. Peas. Beets. Cauliflower. Cabbage. Broccoli. Carrots. Tomatoes. Squash. Eggplant. Herbs. Peppers. Onions. Cucumbers. Brussels sprouts. Fruits Berries. Bananas. Apples. Oranges. Grapes. Papaya. Mango. Pomegranate. Kiwi. Grapefruit. Cherries. Meats and Other Protein Sources Seafood. Lean meats, such as chicken and [place] or lean cuts of pork and beef. Tofu. Eggs. Nuts. Beans. Dairy Low-fat or fat-free dairy products, such as yogurt, cottage cheese, and cheese. Beverages Water. Tea. Coffee. Sugar-free or diet soda. Seltzer water. Milk. Milk alternatives, such as soy or almond milk. Condiments Mustard. Relish. Low-fat, low-sugar ketchup. Low-fat, low-sugar barbecue sauce. Low-fat or fat-free mayonnaise. Sweets and Desserts Sugar-free or low-fat pudding. Sugar-free or low-fat ice cream and other frozen treats. Fats and Oils Avocado. Walnuts. Olive oil. The items listed above may not be a complete list of recommended foods or beverages. Contact your dietitian for more  options.    WHAT FOODS ARE NOT RECOMMENDED?  Grains Refined white flour and flour products, such as bread, pasta, snack foods, and cereals. Beverages Sweetened drinks, such as sweet iced tea and soda. Sweets and Desserts Baked goods, such as cake, cupcakes, pastries, cookies, and cheesecake. The items listed above may not be a complete list of foods and beverages to avoid. Contact your dietitian for more information.   This information is not intended to replace advice given to you by your health care provider. Make sure you discuss any  questions you have with your health care provider.   Document Released: 07/26/2014 Document Reviewed: 07/26/2014 Elsevier Interactive Patient Education [employer].

## 2017-06-03 DIAGNOSIS — M5416 Radiculopathy, lumbar region: Secondary | ICD-10-CM | POA: Diagnosis not present

## 2017-06-05 DIAGNOSIS — Z96641 Presence of right artificial hip joint: Secondary | ICD-10-CM | POA: Diagnosis not present

## 2017-06-05 DIAGNOSIS — S76311D Strain of muscle, fascia and tendon of the posterior muscle group at thigh level, right thigh, subsequent encounter: Secondary | ICD-10-CM | POA: Diagnosis not present

## 2017-06-05 DIAGNOSIS — R531 Weakness: Secondary | ICD-10-CM | POA: Diagnosis not present

## 2017-06-10 DIAGNOSIS — S76311D Strain of muscle, fascia and tendon of the posterior muscle group at thigh level, right thigh, subsequent encounter: Secondary | ICD-10-CM | POA: Diagnosis not present

## 2017-06-10 DIAGNOSIS — Z96641 Presence of right artificial hip joint: Secondary | ICD-10-CM | POA: Diagnosis not present

## 2017-06-10 DIAGNOSIS — R531 Weakness: Secondary | ICD-10-CM | POA: Diagnosis not present

## 2017-06-13 DIAGNOSIS — S76311D Strain of muscle, fascia and tendon of the posterior muscle group at thigh level, right thigh, subsequent encounter: Secondary | ICD-10-CM | POA: Diagnosis not present

## 2017-06-13 DIAGNOSIS — Z96641 Presence of right artificial hip joint: Secondary | ICD-10-CM | POA: Diagnosis not present

## 2017-06-13 DIAGNOSIS — R531 Weakness: Secondary | ICD-10-CM | POA: Diagnosis not present

## 2017-06-16 DIAGNOSIS — S76311D Strain of muscle, fascia and tendon of the posterior muscle group at thigh level, right thigh, subsequent encounter: Secondary | ICD-10-CM | POA: Diagnosis not present

## 2017-06-16 DIAGNOSIS — Z96641 Presence of right artificial hip joint: Secondary | ICD-10-CM | POA: Diagnosis not present

## 2017-06-16 DIAGNOSIS — R531 Weakness: Secondary | ICD-10-CM | POA: Diagnosis not present

## 2017-06-19 DIAGNOSIS — Z96641 Presence of right artificial hip joint: Secondary | ICD-10-CM | POA: Diagnosis not present

## 2017-06-19 DIAGNOSIS — S76311D Strain of muscle, fascia and tendon of the posterior muscle group at thigh level, right thigh, subsequent encounter: Secondary | ICD-10-CM | POA: Diagnosis not present

## 2017-06-19 DIAGNOSIS — R531 Weakness: Secondary | ICD-10-CM | POA: Diagnosis not present

## 2017-06-20 ENCOUNTER — Ambulatory Visit (INDEPENDENT_AMBULATORY_CARE_PROVIDER_SITE_OTHER): Payer: Medicare Other | Admitting: Pulmonary Disease

## 2017-06-20 ENCOUNTER — Encounter: Payer: Self-pay | Admitting: Pulmonary Disease

## 2017-06-20 VITALS — BP 130/72 | HR 75 | Ht 64.0 in | Wt 140.4 lb

## 2017-06-20 DIAGNOSIS — J3089 Other allergic rhinitis: Secondary | ICD-10-CM

## 2017-06-20 DIAGNOSIS — J4520 Mild intermittent asthma, uncomplicated: Secondary | ICD-10-CM

## 2017-06-20 MED ORDER — MONTELUKAST SODIUM 10 MG PO TABS: 10.0000 mg | ORAL_TABLET | Freq: Every day | ORAL | 3 refills | Status: DC

## 2017-06-20 NOTE — Patient Instructions (Signed)
Mild intermittent asthma: Use albuterol as needed for chest tightness wheezing or shortness of breath At this point you do not need a controller medicine so we will take Symbicort off your medication list  Allergic rhinitis: Continue taking montelukast nightly  I will see you back in 1 year or sooner if needed

## 2017-06-20 NOTE — Progress Notes (Signed)
Subjective:    Patient ID: [name], female    DOB: 1933-08-01, 84 y.o.   MRN: [id number]   Synopsis: Former patient of Dr. Joya Gaskins who has moderate persistent asthma. March 2017 eosinophil count 100 cells per microliter, IgE 26 Jul 2015 pulmonary function testing ratio 86%, FEV1 2.10 L (113% predicted), FVC 2.45 (98% predicted), no change in FEV1 with bronchodilator, total lung capacity 4.57 L (93% predicted). DLCO 17.47 (76% predicted).   HPI Chief Complaint  Patient presents with  . Follow-up    f/u asthma, stable on breathing   Mariangela has bene struggling with her right hip for the last year. She had a repeat surgery and she has been dealing with pain in the hip ever since then.  She is now walking with a cane.  She is working in [employer] and [employer].     Lungs haven't been her problem in the last year.  She hasn't been on symbicort in over a year.  She says that she continues to have a little bit of sinus congestion from time to time but that is fairly well controlled with the montelukast.  Past Medical History:  Diagnosis Date  . ALLERGIC RHINITIS   . ANEMIA, IRON DEFICIENCY   . ANXIETY   . ARTHRITIS    s/p bilateral THR  . ASTHMA, EXTRINSIC   . Bronchitis   . Cataracts, both eyes   . Chronic constipation   . FIBROMYALGIA    fibromyalgia  . G E R D   . High cholesterol   . HYPERLIPIDEMIA   . HYPERTENSION   . Interstitial cystitis   . MIGRAINE HEADACHE       Review of Systems  Constitutional: Negative for chills, diaphoresis, fatigue and unexpected weight change.  HENT: Negative for rhinorrhea, sinus pressure and sneezing.   Respiratory: Negative for choking, shortness of breath and wheezing.   Cardiovascular: Negative for chest pain, palpitations and leg swelling.       Objective:   Physical Exam Vitals:   06/20/17 1101  BP: 130/72  Pulse: 75  SpO2: 100%  Weight: 140 lb 6.4 oz (63.7 kg)  Height: 5\' 4"  (1.626 m)   Gen: well appearing HENT: OP  clear, TM's clear, neck supple PULM: CTA B, normal percussion CV: RRR, no mgr, trace edema GI: BS+, soft, nontender Derm: no cyanosis or rash Psyche: normal mood and affect   Labs: March 2017 eosinophil count 100 cells per microliter, IgE 3  PFT May 2017 pulmonary function testing ratio 86%, FEV1 2.10 L (113% predicted), FVC 2.45 (98% predicted), no change in FEV1 with bronchodilator, total lung capacity 4.57 L (93% predicted). DLCO 17.47 (76% predicted).      Assessment & Plan:   Mild intermittent asthma, unspecified whether complicated  Allergic rhinitis due to other allergic trigger, unspecified seasonality  Discussion: This has been a stable interval for Ms. Stecher.  She has not had an exacerbation of her asthma so at this point she has mild intermittent disease.  The most prominent problem is allergic rhinitis which is fairly well controlled with Singulair.  Plan: Mild intermittent asthma: Use albuterol as needed for chest tightness wheezing or shortness of breath At this point you do not need a controller medicine so we will take Symbicort off your medication list  Allergic rhinitis: Continue taking montelukast nightly  I will see you back in 1 year or sooner if needed    Current Outpatient Medications:  .  albuterol (PROAIR HFA) 108 (  90 Base) MCG/ACT inhaler, Inhale 2 puffs into the lungs every 6 (six) hours as needed., Disp: 1 Inhaler, Rfl: 5 .  ALPRAZolam (XANAX) 0.25 MG tablet, 1 by mouth only PRN severe panic not controlled via other methods, Disp: 30 tablet, Rfl: 0 .  amLODipine (NORVASC) 5 MG tablet, Take 5 mg by mouth daily., Disp: , Rfl:  .  busPIRone (BUSPAR) 15 MG tablet, Take 0.5 tablets (7.5 mg total) by mouth 3 (three) times daily., Disp: 90 tablet, Rfl: 5 .  cetirizine (ZYRTEC) 10 MG tablet, Take 10 mg by mouth daily. , Disp: , Rfl:  .  chlorthalidone (HYGROTON) 25 MG tablet, TAKE 1/2 TABLET BY MOUTH DAILY, Disp: 15 tablet, Rfl: 3 .  Cholecalciferol  (VITAMIN D3) 5000 units TABS, 5,000 IU OTC vitamin D3 daily., Disp: 90 tablet, Rfl: 3 .  docusate sodium (COLACE) 100 MG capsule, Take 1 capsule (100 mg total) by mouth 2 (two) times daily., Disp: 10 capsule, Rfl: 0 .  DULoxetine (CYMBALTA) 30 MG capsule, TAKE 1 CAPSULE BY MOUTH DAILY, Disp: 90 capsule, Rfl: 1 .  ferrous sulfate (FERROUSUL) 325 (65 FE) MG tablet, Take 1 tablet (325 mg total) by mouth 3 (three) times daily with meals., Disp: , Rfl:  .  fluticasone (FLONASE) 50 MCG/ACT nasal spray, USE 2 SPRAYS INTO EACH NOSTRIL EVERY DAY, Disp: 48 g, Rfl: 3 .  losartan (COZAAR) 100 MG tablet, TAKE 1 TABLET BY MOUTH DAILY, Disp: 90 tablet, Rfl: 0 .  montelukast (SINGULAIR) 10 MG tablet, TAKE 1 TABLET BY MOUTH EACH NIGHT AT BEDTIME, Disp: 90 tablet, Rfl: 0 .  omeprazole (PRILOSEC) 20 MG capsule, TAKE 1 CAPSULE BY MOUTH DAILY, Disp: 90 capsule, Rfl: 1 .  polyethylene glycol (MIRALAX / GLYCOLAX) packet, Take 17 g by mouth 2 (two) times daily., Disp: 14 each, Rfl: 0 .  vitamin B-12 (CYANOCOBALAMIN) 1000 MCG tablet, Take 1 tablet (1,000 mcg total) by mouth daily., Disp: 90 tablet, Rfl: 3

## 2017-06-23 DIAGNOSIS — R531 Weakness: Secondary | ICD-10-CM | POA: Diagnosis not present

## 2017-06-23 DIAGNOSIS — S76311D Strain of muscle, fascia and tendon of the posterior muscle group at thigh level, right thigh, subsequent encounter: Secondary | ICD-10-CM | POA: Diagnosis not present

## 2017-06-23 DIAGNOSIS — Z96641 Presence of right artificial hip joint: Secondary | ICD-10-CM | POA: Diagnosis not present

## 2017-06-25 DIAGNOSIS — M1611 Unilateral primary osteoarthritis, right hip: Secondary | ICD-10-CM | POA: Diagnosis not present

## 2017-06-25 DIAGNOSIS — M25511 Pain in right shoulder: Secondary | ICD-10-CM | POA: Diagnosis not present

## 2017-06-25 DIAGNOSIS — M7061 Trochanteric bursitis, right hip: Secondary | ICD-10-CM | POA: Diagnosis not present

## 2017-06-25 DIAGNOSIS — M5416 Radiculopathy, lumbar region: Secondary | ICD-10-CM | POA: Diagnosis not present

## 2017-06-25 HISTORY — DX: Trochanteric bursitis, right hip: M70.61

## 2017-06-25 HISTORY — DX: Pain in right shoulder: M25.511

## 2017-06-27 DIAGNOSIS — Z96641 Presence of right artificial hip joint: Secondary | ICD-10-CM | POA: Diagnosis not present

## 2017-06-27 DIAGNOSIS — S76311D Strain of muscle, fascia and tendon of the posterior muscle group at thigh level, right thigh, subsequent encounter: Secondary | ICD-10-CM | POA: Diagnosis not present

## 2017-06-27 DIAGNOSIS — R531 Weakness: Secondary | ICD-10-CM | POA: Diagnosis not present

## 2017-07-15 ENCOUNTER — Other Ambulatory Visit: Payer: Self-pay | Admitting: Cardiovascular Disease

## 2017-07-22 DIAGNOSIS — L720 Epidermal cyst: Secondary | ICD-10-CM | POA: Diagnosis not present

## 2017-07-24 DIAGNOSIS — M5416 Radiculopathy, lumbar region: Secondary | ICD-10-CM | POA: Diagnosis not present

## 2017-07-24 DIAGNOSIS — M7061 Trochanteric bursitis, right hip: Secondary | ICD-10-CM | POA: Diagnosis not present

## 2017-07-24 DIAGNOSIS — Z96641 Presence of right artificial hip joint: Secondary | ICD-10-CM | POA: Diagnosis not present

## 2017-07-24 DIAGNOSIS — M25511 Pain in right shoulder: Secondary | ICD-10-CM | POA: Diagnosis not present

## 2017-07-24 DIAGNOSIS — M7522 Bicipital tendinitis, left shoulder: Secondary | ICD-10-CM | POA: Diagnosis not present

## 2017-07-29 ENCOUNTER — Other Ambulatory Visit (HOSPITAL_COMMUNITY): Payer: Self-pay | Admitting: Orthopedic Surgery

## 2017-07-29 DIAGNOSIS — Z96641 Presence of right artificial hip joint: Secondary | ICD-10-CM

## 2017-08-05 ENCOUNTER — Ambulatory Visit (INDEPENDENT_AMBULATORY_CARE_PROVIDER_SITE_OTHER): Payer: Medicare Other | Admitting: Cardiovascular Disease

## 2017-08-05 ENCOUNTER — Encounter: Payer: Self-pay | Admitting: Cardiovascular Disease

## 2017-08-05 VITALS — BP 144/80 | HR 87 | Ht 64.0 in | Wt 141.0 lb

## 2017-08-05 DIAGNOSIS — E7800 Pure hypercholesterolemia, unspecified: Secondary | ICD-10-CM

## 2017-08-05 DIAGNOSIS — Z0000 Encounter for general adult medical examination without abnormal findings: Secondary | ICD-10-CM

## 2017-08-05 DIAGNOSIS — I447 Left bundle-branch block, unspecified: Secondary | ICD-10-CM | POA: Diagnosis not present

## 2017-08-05 DIAGNOSIS — I10 Essential (primary) hypertension: Secondary | ICD-10-CM | POA: Diagnosis not present

## 2017-08-05 NOTE — Assessment & Plan Note (Signed)
History of hyperlipidemia intolerant to statin therapy 

## 2017-08-05 NOTE — Progress Notes (Signed)
08/05/2017 [name]   08-03-1933  [id number]  Primary Physician Sonya Dance, DO Primary Cardiologist: Sonya Harp MD [name], [place]  HPI:  [name] is a 84 y.o.  mildly-overweight married Caucasian female, mother of 3 and grandmother of 1 grandchild, who works as a Acupuncturist. I last saw her in the office 05/28/2016. Her husband [name] was a patient of mine as well unfortunately had  [place] body dementia and passed away in 2022-09-22 of last year. She lives  by herself with her daughter [name] who lives 2 miles away and a son who lives up in [place]..Her risk factors include type 2 diabetes, hypertension, and hyperlipidemia. She does have fibromyalgia. She has had a normal 2D echo and Myoview stress test in the past.  Since I saw her a little over a year ago she unfortunately had to have a redo right total hip replacement on 09/24/2016 by Dr. Alvan Barr.  She denies chest pain or shortness of breath.  She does have purplish discoloration of both feet despite normal pulses and normal Doppler studies for unclear reasons.     Current Meds  Medication Sig  . albuterol (PROAIR HFA) 108 (90 Base) MCG/ACT inhaler Inhale 2 puffs into the lungs every 6 (six) hours as needed.  . ALPRAZolam (XANAX) 0.25 MG tablet 1 by mouth only PRN severe panic not controlled via other methods  . amLODipine (NORVASC) 5 MG tablet Take 5 mg by mouth daily.  . busPIRone (BUSPAR) 15 MG tablet Take 0.5 tablets (7.5 mg total) by mouth 3 (three) times daily.  . cetirizine (ZYRTEC) 10 MG tablet Take 10 mg by mouth daily.   . chlorthalidone (HYGROTON) 25 MG tablet TAKE 1/2 TABLET BY MOUTH DAILY  . Cholecalciferol (VITAMIN D3) 5000 units TABS 5,000 IU OTC vitamin D3 daily.  Marland Kitchen docusate sodium (COLACE) 100 MG capsule Take 1 capsule (100 mg total) by mouth 2 (two) times daily.  . DULoxetine (CYMBALTA) 30 MG capsule TAKE 1 CAPSULE BY MOUTH DAILY  . ferrous sulfate (FERROUSUL) 325 (65 FE)  MG tablet Take 1 tablet (325 mg total) by mouth 3 (three) times daily with meals.  . fluticasone (FLONASE) 50 MCG/ACT nasal spray USE 2 SPRAYS INTO EACH NOSTRIL EVERY DAY  . losartan (COZAAR) 100 MG tablet TAKE 1 TABLET BY MOUTH DAILY  . montelukast (SINGULAIR) 10 MG tablet Take 1 tablet (10 mg total) by mouth at bedtime.  Marland Kitchen omeprazole (PRILOSEC) 20 MG capsule TAKE 1 CAPSULE BY MOUTH DAILY  . vitamin B-12 (CYANOCOBALAMIN) 1000 MCG tablet Take 1 tablet (1,000 mcg total) by mouth daily.  . [DISCONTINUED] polyethylene glycol (MIRALAX / GLYCOLAX) packet Take 17 g by mouth 2 (two) times daily.     Allergies  Allergen Reactions  . Morphine Sulfate Other (See Comments)    Makes her hyper    Social History   Socioeconomic History  . Marital status: Married    Spouse name: Not on file  . Number of children: Not on file  . Years of education: Not on file  . Highest education level: Not on file  Occupational History  . Not on file  Social Needs  . Financial resource strain: Not on file  . Food insecurity:    Worry: Not on file    Inability: Not on file  . Transportation needs:    Medical: Not on file    Non-medical: Not on file  Tobacco Use  . Smoking status: Former  Smoker    Packs/day: 0.30    Years: 10.00    Pack years: 3.00    Types: Cigarettes    Last attempt to quit: 03/25/1968    Years since quitting: 49.3  . Smokeless tobacco: Never Used  . 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 Retired Visual merchandiser and Sexual Activity  . Alcohol use: No    Alcohol/week: 0.0 oz  . Drug use: No  . Sexual activity: Not Currently  Lifestyle  . Physical activity:    Days per week: Not on file    Minutes per session: Not on file  . Stress: Not on file  Relationships  . Social connections:    Talks on phone: Not on file    Gets together: Not on file    Attends religious service: Not on file    Active member of club or organization: Not on file    Attends meetings of clubs or  organizations: Not on file    Relationship status: Not on file  . Intimate partner violence:    Fear of current or ex partner: Not on file    Emotionally abused: Not on file    Physically abused: Not on file    Forced sexual activity: Not on file  Other Topics Concern  . Not on file  Social History Narrative   Lives with husband in a 3 story home.  Has 2 children.  Retired Glass blower/designer.  Education: Scientist, water quality.     Review of Systems: General: negative for chills, fever, night sweats or weight changes.  Cardiovascular: negative for chest pain, dyspnea on exertion, edema, orthopnea, palpitations, paroxysmal nocturnal dyspnea or shortness of breath Dermatological: negative for rash Respiratory: negative for cough or wheezing Urologic: negative for hematuria Abdominal: negative for nausea, vomiting, diarrhea, bright red blood per rectum, melena, or hematemesis Neurologic: negative for visual changes, syncope, or dizziness All other systems reviewed and are otherwise negative except as noted above.    Blood pressure (!) 144/80, pulse 87, height 5\' 4"  (1.626 m), weight 141 lb (64 kg).  General appearance: alert and no distress Neck: no adenopathy, no carotid bruit, no JVD, supple, symmetrical, trachea midline and thyroid not enlarged, symmetric, no tenderness/mass/nodules Lungs: clear to auscultation bilaterally Heart: regular rate and rhythm, S1, S2 normal, no murmur, click, rub or gallop Extremities: extremities normal, atraumatic, no cyanosis or edema Pulses: 2+ and symmetric Skin: Skin color, texture, turgor normal. No rashes or lesions Neurologic: Alert and oriented X 3, normal strength and tone. Normal symmetric reflexes. Normal coordination and gait  EKG sinus rhythm 87 with left bundle branch block which is chronic.  I personally reviewed this EKG.  ASSESSMENT AND PLAN:   Hyperlipidemia History of hyperlipidemia intolerant to statin therapy.  Essential  hypertension History of essential hypertension her blood pressure measured today at 144/80 she is on amlodipine and losartan as well as chlorthalidone.  Continue current meds at current doses.  LBBB (left bundle branch block) Chronic      Sonya Harp MD [facility], [facility] 08/05/2017 2:04 PM

## 2017-08-05 NOTE — Assessment & Plan Note (Signed)
Chronic. 

## 2017-08-05 NOTE — Patient Instructions (Signed)

## 2017-08-05 NOTE — Assessment & Plan Note (Signed)
History of essential hypertension her blood pressure measured today at 144/80 she is on amlodipine and losartan as well as chlorthalidone.  Continue current meds at current doses.

## 2017-08-08 ENCOUNTER — Encounter (HOSPITAL_COMMUNITY)
Admission: RE | Admit: 2017-08-08 | Discharge: 2017-08-08 | Disposition: A | Discharge status: Home / Self Care | Payer: Medicare Other | Source: Ambulatory Visit | Attending: Orthopedic Surgery | Admitting: Orthopedic Surgery

## 2017-08-08 DIAGNOSIS — Z471 Aftercare following joint replacement surgery: Secondary | ICD-10-CM | POA: Diagnosis not present

## 2017-08-08 DIAGNOSIS — Z96641 Presence of right artificial hip joint: Secondary | ICD-10-CM | POA: Insufficient documentation

## 2017-08-08 MED ORDER — TECHNETIUM TC 99M MEDRONATE IV KIT
21.5000 | PACK | Freq: Once | INTRAVENOUS | Status: AC | PRN
  Administered 2017-08-08: 21.5 via INTRAVENOUS

## 2017-08-11 ENCOUNTER — Other Ambulatory Visit: Payer: Self-pay | Admitting: Cardiovascular Disease

## 2017-08-15 DIAGNOSIS — M1611 Unilateral primary osteoarthritis, right hip: Secondary | ICD-10-CM | POA: Diagnosis not present

## 2017-08-15 DIAGNOSIS — M25511 Pain in right shoulder: Secondary | ICD-10-CM | POA: Diagnosis not present

## 2017-08-15 DIAGNOSIS — M7061 Trochanteric bursitis, right hip: Secondary | ICD-10-CM | POA: Diagnosis not present

## 2017-08-15 DIAGNOSIS — M48061 Spinal stenosis, lumbar region without neurogenic claudication: Secondary | ICD-10-CM | POA: Diagnosis not present

## 2017-08-21 ENCOUNTER — Other Ambulatory Visit: Payer: Self-pay

## 2017-08-21 DIAGNOSIS — I7300 Raynaud's syndrome without gangrene: Secondary | ICD-10-CM

## 2017-08-22 ENCOUNTER — Other Ambulatory Visit: Payer: Self-pay | Admitting: Family Medicine

## 2017-08-25 ENCOUNTER — Telehealth: Payer: Self-pay | Admitting: Family Medicine

## 2017-08-25 NOTE — Telephone Encounter (Signed)
Provider required OV for meds review--called patient left message to contact office to set up appointment--glh

## 2017-08-25 NOTE — Telephone Encounter (Signed)
Noted MPulliam, CMA/RT(R)  

## 2017-09-04 DIAGNOSIS — M5416 Radiculopathy, lumbar region: Secondary | ICD-10-CM | POA: Diagnosis not present

## 2017-09-22 NOTE — Progress Notes (Addendum)
Office Visit Note  Patient: [name]             Date of Birth: [birth year]           MRN: [id number]             PCP: [name], DO Referring: [name], DO Visit Date: 10/01/2017 Occupation: @GUAROCC @    Subjective:  Discoloration of hands   History of Present Illness: [name] is a 84 y.o. female seen in consultation per request of her PCP for evaluation of [name].  According to patient in July 2018 she underwent right total hip replacement and she was having difficulty walking after that.  She states 3 months ago she started noticing discoloration in her hands and feet and tingling sensation in her hands and feet.  She states she has decreased grip strength in her hands and decreased sensation.  She also has history of osteoarthritis in her hands for many years she states recently she has been experiencing increased pain in her hands.  She has chronic back problems and has been diagnosed with DDD of the lumbar spine.  She states she has had 2 surgeries on her back in the past.  She is quite active she walks her dog and also does water aerobics.  Activities of Daily Living:  Patient reports morning stiffness for  none.   Patient Denies nocturnal pain.  Difficulty dressing/grooming: Denies Difficulty climbing stairs: Reports Difficulty getting out of chair: REPORTS Difficulty using hands for taps, buttons, cutlery, and/or writing: Reports   Review of Systems  Constitutional: Negative for activity change, fatigue, night sweats, weight gain and weight loss.  HENT: Positive for mouth dryness. Negative for mouth sores, trouble swallowing, trouble swallowing and nose dryness.   Eyes: Positive for dryness. Negative for pain, redness and visual disturbance.  Respiratory: Positive for shortness of breath. Negative for cough and difficulty breathing.   Cardiovascular: Negative for chest pain, palpitations, hypertension, irregular heartbeat and swelling in  legs/feet.  Gastrointestinal: Negative for blood in stool, constipation and diarrhea.  Endocrine: Negative for heat intolerance, excessive thirst and increased urination.  Genitourinary: Negative for difficulty urinating and vaginal dryness.  Musculoskeletal: Positive for arthralgias, gait problem, joint pain and joint swelling. Negative for myalgias, muscle weakness, morning stiffness, muscle tenderness and myalgias.  Skin: Positive for color change. Negative for rash, hair loss, skin tightness, ulcers and sensitivity to sunlight.  Allergic/Immunologic: Negative for susceptible to infections.  Neurological: Positive for numbness. Negative for dizziness, memory loss, night sweats and weakness.  Hematological: Negative for bruising/bleeding tendency and swollen glands.  Psychiatric/Behavioral: Positive for sleep disturbance. Negative for depressed mood. The patient is not nervous/anxious.     PMFS History:  Patient Active Problem List   Diagnosis Date Noted  . History of COPD 10/01/2017  . Former smoker 10/01/2017  . Primary [name] disease without gangrene-  hands and feet 06/02/2017  . Iron deficiency anemia, unspecified 04/23/2017  . GAD (generalized anxiety disorder) 04/23/2017  . Insomnia 04/23/2017  . S/P right TH revision 09/24/2016  . Type 2 diabetes mellitus without complication, without long-term current use of insulin ([place]) 01/28/2016  . Medication refused- (any cholesterol ones) 01/20/2016  . Environmental and seasonal allergies 01/20/2016  . Chronic fatigue syndrome with fibromyalgia 01/20/2016  . Vitamin D deficiency 01/19/2016  . Prediabetes- 6.0 12/17 01/11/2016  . Adjustment disorder with mixed anxiety and depressed mood 01/11/2016  . Medication intolerance- statins- (make her FM much W) 01/10/2016  . Depression  11/24/2015  . Reaction, situational, acute, to stress(husband dying) 09/21/2015  . B12 deficiency 08/30/2015  . Dysuria 08/18/2015  . Chronic Fatigue-  2ndary to FM and Mood d/o  06/20/2015  . Memory loss   . Lumbar radiculopathy, chronic   . Herpes simplex labialis 04/03/2012  . Shingles 04/12/2011  . LBBB (left bundle branch block) 11/13/2010  . MIGRAINE HEADACHE 05/30/2010  . Iron deficiency anemia 11/03/2009  . Asthma, mild persistent 12/19/2008  . Hyperlipidemia 08/12/2007  . Anxiety state 08/12/2007  . Essential hypertension 08/12/2007  . Allergic rhinitis 08/12/2007  . G E R D 08/12/2007  . ARTHRITIS 08/12/2007  . Muscle pain, fibromyalgia 08/12/2007    Past Medical History:  Diagnosis Date  . ALLERGIC RHINITIS   . ANEMIA, IRON DEFICIENCY   . ANXIETY   . ARTHRITIS    s/p bilateral THR  . ASTHMA, EXTRINSIC   . Bronchitis   . Cataracts, both eyes   . Chronic constipation   . FIBROMYALGIA    fibromyalgia  . G E R D   . High cholesterol   . HYPERLIPIDEMIA   . HYPERTENSION   . Interstitial cystitis   . MIGRAINE HEADACHE     Family History  Problem Relation Age of Onset  . Arthritis Mother   . Hypertension Mother   . Heart disease Father   . Arthritis Father   . Hypertension Father   . Depression Sister   . Hyperlipidemia Sister   . Hypertension Other   . Hyperlipidemia Other    Past Surgical History:  Procedure Laterality Date  . ABDOMINAL HYSTERECTOMY    . ANTERIOR HIP REVISION Right 09/24/2016   Procedure: RIGHT HIP ACETABULUM AND FEMORAL HEAD REVISION WITH BONE GRAFT;  Surgeon: Paralee Cancel, MD;  Location: [facility];  Service: Orthopedics;  Laterality: Right;  . APPENDECTOMY    . BACK SURGERY    . CATARACT EXTRACTION    . COLONOSCOPY    . DOPPLER ECHOCARDIOGRAPHY    . ESOPHAGOGASTRODUODENOSCOPY ENDOSCOPY    . LUMBAR DISC SURGERY    . NM MYOVIEW LTD     stress test  . Ovarian cyst removed    . ROTATOR CUFF REPAIR Left 5-6 years ago   Dr Onnie Graham; done at [facility]   . TOTAL HIP ARTHROPLASTY    . TOTAL SHOULDER ARTHROPLASTY     Social History   Social History Narrative   Lives with husband  in a 3 story home.  Has 2 children.  Retired Glass blower/designer.  Education: Scientist, water quality.     Objective: Vital Signs: BP 128/68 (BP Location: Right Arm, Patient Position: Sitting, Cuff Size: Normal)   Pulse 84   Resp 16   Ht 5\' 4"  (1.626 m)   Wt 141 lb (64 kg)   BMI 24.20 kg/m    Physical Exam  Constitutional: She is oriented to person, place, and time. She appears well-developed and well-nourished.  HENT:  Head: Normocephalic and atraumatic.  Eyes: Conjunctivae and EOM are normal.  Neck: Normal range of motion.  Cardiovascular: Normal rate, regular rhythm, normal heart sounds and intact distal pulses.  Pulmonary/Chest: Effort normal and breath sounds normal.  Abdominal: Soft. Bowel sounds are normal.  Lymphadenopathy:    She has no cervical adenopathy.  Neurological: She is alert and oriented to person, place, and time.  Skin: Skin is warm and dry. Capillary refill takes 2 to 3 seconds.  Her hands and feet were cool to touch.  Her hands had erythema and  feet had digital cyanosis.  Psychiatric: She has a normal mood and affect. Her behavior is normal.  Nursing note and vitals reviewed.    Musculoskeletal Exam: C-spine good range of motion.  Lumbar spine limited painful range of motion.  Shoulder joints elbow joints wrist joints are good range of motion.  She has bilateral CMC PIP/DIP significant arthritis.  Hip joints knee joints ankles MTPs PIPs were in good range of motion.  She has some osteoarthritic changes in her feet.  CDAI Exam: No CDAI exam completed.    Investigation: Findings:  03/27/2017: Vitamin D 116, B12 1071, folate 8, ferritin 163, TIBC 278, iron 66 11/29/16: T4 1.05, TSH 1.670   Component     Latest Ref Rng & Units 04/23/2017  TIBC     250 - 450 ug/dL 278  UIBC     118 - 369 ug/dL 212  Iron     27 - 139 ug/dL 66  Iron Saturation     15 - 55 % 24  Vitamin D, 25-Hydroxy     30.0 - 100.0 ng/mL 116.0 (H)  Vitamin B12     232 - 1,245 pg/mL 1,071   Folate     >3.0 ng/mL 8.0  Ferritin     15 - 150 ng/mL 163 (H)   CBC Latest Ref Rng & Units 04/23/2017 11/29/2016 10/04/2016  WBC 3.4 - 10.8 x10E3/uL 6.2 6.2 -  Hemoglobin 11.1 - 15.9 g/dL 13.5 12.3 10.5(A)  Hematocrit 34.0 - 46.6 % 40.4 38.9 -  Platelets 150 - 379 x10E3/uL 216 202 -   CMP Latest Ref Rng & Units 11/29/2016 09/26/2016 09/25/2016  Glucose 65 - 99 mg/dL 108(H) 134(H) 147(H)  BUN 8 - 27 mg/dL 15 21(H) 22(H)  Creatinine 0.57 - 1.00 mg/dL 0.79 0.78 0.76  Sodium 134 - 144 mmol/L 137 136 139  Potassium 3.5 - 5.2 mmol/L 3.8 3.8 3.3(L)  Chloride 96 - 106 mmol/L 94(L) 102 102  CO2 20 - 29 mmol/L 27 26 28   Calcium 8.7 - 10.3 mg/dL 9.6 8.5(L) 8.6(L)  Total Protein 6.0 - 8.5 g/dL 6.6 - -  Total Bilirubin 0.0 - 1.2 mg/dL 0.3 - -  Alkaline Phos 39 - 117 IU/L 87 - -  AST 0 - 40 IU/L 15 - -  ALT 0 - 32 IU/L 13 - -     Imaging: No results found.  Speciality Comments: No specialty comments available.    Procedures:  No procedures performed Allergies: Morphine sulfate   Assessment / Plan:     Visit Diagnoses: [name] syndrome without gangrene -patient has severe [name] phenomenon involving her upper and lower extremities.  Patient states symptoms have been there only for last 1 year.  She believes the symptoms started after the surgery.  She had no digital ulcers.  She complains of paresthesias due to raynauds phenomenon.  She also has significant discomfort.- Plan: Urinalysis, Routine w reflex microscopic, ANA, Anti-DNA antibody, double-stranded, C3 and C4, Sedimentation rate, Rheumatoid factor, Angiotensin converting enzyme, Pan-ANCA, Cryoglobulin, Anti-scleroderma antibody, RNP Antibody, Anti-Smith antibody, Sjogrens syndrome-A extractable nuclear antibody, Sjogrens syndrome-B extractable nuclear antibody, Beta-2 glycoprotein antibodies, Cardiolipin antibodies, IgG, IgM, IgA, Lupus Anticoagulant Eval w/Reflex, Serum protein electrophoresis with reflex, IgG, IgA, IgM, Hepatitis B  core antibody, IgM, Hepatitis B surface antigen, Hepatitis C antibody.  She is already on amlodipine.  I would like to see the lab results before making any further suggestions.  Other fatigue -she has been experiencing some fatigue.  Plan: CBC with Differential/Platelet, COMPLETE  METABOLIC PANEL WITH GFR, CK, Glucose 6 phosphate dehydrogenase  Osteoarthritis bilateral hands-she has severe osteoarthritis involving her bilateral hands but no synovitis was noted.  DDD (degenerative disc disease), lumbar - s/p surgery x2.  She has chronic pain in her back.  History of total hip replacement, right - 1999 and 2018.  She has fairly good range of motion.  Vitamin D deficiency-patient states that she has been taking vitamin D.  Fibromyalgia-patient states that she does not have any features of fibromyalgia but she was diagnosed with fibromyalgia in the past.  Other medical problems are listed as follows:  History of migraine  Essential hypertension  LBBB (left bundle branch block)  History of COPD  History of gastroesophageal reflux (GERD)  History of hyperlipidemia  Former smoker - Quit smoking 40 years ago.    Orders: Orders Placed This Encounter  Procedures  . CBC with Differential/Platelet  . COMPLETE METABOLIC PANEL WITH GFR  . Urinalysis, Routine w reflex microscopic  . ANA  . Anti-DNA antibody, double-stranded  . C3 and C4  . Sedimentation rate  . Rheumatoid factor  . Angiotensin converting enzyme  . Pan-ANCA  . Cryoglobulin  . CK  . Anti-scleroderma antibody  . RNP Antibody  . Anti-Smith antibody  . Sjogrens syndrome-A extractable nuclear antibody  . Sjogrens syndrome-B extractable nuclear antibody  . Beta-2 glycoprotein antibodies  . Cardiolipin antibodies, IgG, IgM, IgA  . Lupus Anticoagulant Eval w/Reflex  . Serum protein electrophoresis with reflex  . IgG, IgA, IgM  . Glucose 6 phosphate dehydrogenase  . Hepatitis B core antibody, IgM  . Hepatitis B  surface antigen  . Hepatitis C antibody   No orders of the defined types were placed in this encounter.   Face-to-face time spent with patient was 50 minutes. Greater than 50% of time was spent in counseling and coordination of care.  Follow-Up Instructions: Return for [name], OA.   Bo Merino, MD  Note - This record has been created using Editor, commissioning.  Chart creation errors have been sought, but may not always  have been located. Such creation errors do not reflect on  the standard of medical care.

## 2017-09-29 ENCOUNTER — Other Ambulatory Visit: Payer: Self-pay | Admitting: Pulmonary Disease

## 2017-09-30 ENCOUNTER — Other Ambulatory Visit: Payer: Self-pay | Admitting: Pulmonary Disease

## 2017-10-01 ENCOUNTER — Encounter: Payer: Self-pay | Admitting: Rheumatology

## 2017-10-01 ENCOUNTER — Ambulatory Visit (INDEPENDENT_AMBULATORY_CARE_PROVIDER_SITE_OTHER): Payer: Medicare Other | Admitting: Rheumatology

## 2017-10-01 VITALS — BP 128/68 | HR 84 | Resp 16 | Ht 64.0 in | Wt 141.0 lb

## 2017-10-01 DIAGNOSIS — M19041 Primary osteoarthritis, right hand: Secondary | ICD-10-CM

## 2017-10-01 DIAGNOSIS — M19042 Primary osteoarthritis, left hand: Secondary | ICD-10-CM

## 2017-10-01 DIAGNOSIS — E559 Vitamin D deficiency, unspecified: Secondary | ICD-10-CM

## 2017-10-01 DIAGNOSIS — Z8669 Personal history of other diseases of the nervous system and sense organs: Secondary | ICD-10-CM | POA: Diagnosis not present

## 2017-10-01 DIAGNOSIS — Z96641 Presence of right artificial hip joint: Secondary | ICD-10-CM | POA: Diagnosis not present

## 2017-10-01 DIAGNOSIS — M5136 Other intervertebral disc degeneration, lumbar region: Secondary | ICD-10-CM | POA: Diagnosis not present

## 2017-10-01 DIAGNOSIS — M797 Fibromyalgia: Secondary | ICD-10-CM

## 2017-10-01 DIAGNOSIS — R5383 Other fatigue: Secondary | ICD-10-CM | POA: Diagnosis not present

## 2017-10-01 DIAGNOSIS — Z8709 Personal history of other diseases of the respiratory system: Secondary | ICD-10-CM | POA: Diagnosis not present

## 2017-10-01 DIAGNOSIS — I10 Essential (primary) hypertension: Secondary | ICD-10-CM | POA: Diagnosis not present

## 2017-10-01 DIAGNOSIS — Z8639 Personal history of other endocrine, nutritional and metabolic disease: Secondary | ICD-10-CM | POA: Diagnosis not present

## 2017-10-01 DIAGNOSIS — Z8719 Personal history of other diseases of the digestive system: Secondary | ICD-10-CM

## 2017-10-01 DIAGNOSIS — I447 Left bundle-branch block, unspecified: Secondary | ICD-10-CM

## 2017-10-01 DIAGNOSIS — Z87891 Personal history of nicotine dependence: Secondary | ICD-10-CM

## 2017-10-01 DIAGNOSIS — I7300 Raynaud's syndrome without gangrene: Secondary | ICD-10-CM

## 2017-10-01 HISTORY — DX: Personal history of other diseases of the respiratory system: Z87.09

## 2017-10-06 LAB — URINALYSIS, ROUTINE W REFLEX MICROSCOPIC
Bacteria, UA: NONE SEEN /HPF
Bilirubin Urine: NEGATIVE
Glucose, UA: NEGATIVE
Hgb urine dipstick: NEGATIVE
Hyaline Cast: NONE SEEN /LPF
KETONES UR: NEGATIVE
NITRITE: NEGATIVE
PROTEIN: NEGATIVE
RBC / HPF: NONE SEEN /HPF (ref 0–2)
SPECIFIC GRAVITY, URINE: 1.011 (ref 1.001–1.03)
Squamous Epithelial / LPF: NONE SEEN /HPF (ref <=5)
WBC, UA: NONE SEEN /HPF (ref 0–5)
pH: 7 (ref 5.0–8.0)

## 2017-10-06 LAB — COMPLETE METABOLIC PANEL WITH GFR
AG Ratio: 2 (calc) (ref 1.0–2.5)
ALT: 17 U/L (ref 6–29)
AST: 17 U/L (ref 10–35)
Albumin: 4.7 g/dL (ref 3.6–5.1)
Alkaline phosphatase (APISO): 71 U/L (ref 33–130)
BUN: 13 mg/dL (ref 7–25)
CALCIUM: 9.8 mg/dL (ref 8.6–10.4)
CHLORIDE: 95 mmol/L — AB (ref 98–110)
CO2: 26 mmol/L (ref 20–32)
Creat: 0.76 mg/dL (ref 0.60–0.88)
GFR, EST NON AFRICAN AMERICAN: 73 mL/min/{1.73_m2} (ref >=60)
GFR, Est African American: 85 mL/min/{1.73_m2} (ref >=60)
Globulin: 2.3 g/dL (calc) (ref 1.9–3.7)
Glucose, Bld: 96 mg/dL (ref 65–99)
Potassium: 3.6 mmol/L (ref 3.5–5.3)
Sodium: 135 mmol/L (ref 135–146)
Total Bilirubin: 0.4 mg/dL (ref 0.2–1.2)
Total Protein: 7 g/dL (ref 6.1–8.1)

## 2017-10-06 LAB — ANGIOTENSIN CONVERTING ENZYME: ANGIOTENSIN-CONVERTING ENZYME: 22 U/L (ref 9–67)

## 2017-10-06 LAB — CBC WITH DIFFERENTIAL/PLATELET
Basophils Absolute: 21 cells/uL (ref 0–200)
Basophils Relative: 0.3 %
EOS ABS: 42 {cells}/uL (ref 15–500)
Eosinophils Relative: 0.6 %
HEMATOCRIT: 41.1 % (ref 35.0–45.0)
Hemoglobin: 13.8 g/dL (ref 11.7–15.5)
LYMPHS ABS: 1274 {cells}/uL (ref 850–3900)
MCH: 29.9 pg (ref 27.0–33.0)
MCHC: 33.6 g/dL (ref 32.0–36.0)
MCV: 89.2 fL (ref 80.0–100.0)
MPV: 9.1 fL (ref 7.5–12.5)
Monocytes Relative: 8 %
NEUTROS PCT: 72.9 %
Neutro Abs: 5103 cells/uL (ref 1500–7800)
PLATELETS: 244 10*3/uL (ref 140–400)
RBC: 4.61 10*6/uL (ref 3.80–5.10)
RDW: 13.4 % (ref 11.0–15.0)
TOTAL LYMPHOCYTE: 18.2 %
WBC mixed population: 560 cells/uL (ref 200–950)
WBC: 7 10*3/uL (ref 3.8–10.8)

## 2017-10-06 LAB — RHEUMATOID FACTOR: Rhuematoid fact SerPl-aCnc: <14 IU/mL (ref <14)

## 2017-10-06 LAB — PAN-ANCA
ANCA SCREEN: NEGATIVE
Myeloperoxidase Abs: <1 AI
Serine Protease 3: <1 AI

## 2017-10-06 LAB — PROTEIN ELECTROPHORESIS, SERUM, WITH REFLEX
ALBUMIN ELP: 4.7 g/dL (ref 3.8–4.8)
ALPHA 2: 0.9 g/dL (ref 0.5–0.9)
Alpha 1: 0.3 g/dL (ref 0.2–0.3)
BETA GLOBULIN: 0.4 g/dL (ref 0.4–0.6)
Beta 2: 0.2 g/dL (ref 0.2–0.5)
Gamma Globulin: 0.4 g/dL — ABNORMAL LOW (ref 0.8–1.7)
Total Protein: 6.9 g/dL (ref 6.1–8.1)

## 2017-10-06 LAB — C3 AND C4
C3 COMPLEMENT: 142 mg/dL
C4 Complement: 29 mg/dL

## 2017-10-06 LAB — BETA-2 GLYCOPROTEIN ANTIBODIES
Beta-2 Glyco 1 IgA: <9 SAU (ref <=20)
Beta-2 Glyco 1 IgM: <9 SMU (ref <=20)

## 2017-10-06 LAB — LUPUS ANTICOAGULANT EVAL W/ REFLEX
DRVVT: 39 s (ref <=45)
PTT-LA SCREEN: 36 s (ref <=40)

## 2017-10-06 LAB — CRYOGLOBULIN: Cryoglobulin, Qualitative Analysis: NOT DETECTED

## 2017-10-06 LAB — SJOGRENS SYNDROME-A EXTRACTABLE NUCLEAR ANTIBODY: SSA (RO) (ENA) ANTIBODY, IGG: NEGATIVE AI

## 2017-10-06 LAB — CARDIOLIPIN ANTIBODIES, IGG, IGM, IGA
Anticardiolipin IgG: <14 [GPL'U]
Anticardiolipin IgM: <12 [MPL'U]

## 2017-10-06 LAB — ANA: ANA: POSITIVE — AB

## 2017-10-06 LAB — HEPATITIS B CORE ANTIBODY, IGM: HEP B C IGM: NONREACTIVE

## 2017-10-06 LAB — GLUCOSE 6 PHOSPHATE DEHYDROGENASE: G-6PDH: 15.2 U/g Hgb (ref 7.0–20.5)

## 2017-10-06 LAB — IGG, IGA, IGM
IMMUNOGLOBULIN A: 53 mg/dL (ref 20–320)
IgG (Immunoglobin G), Serum: 404 mg/dL — ABNORMAL LOW (ref 600–1540)
IgM, Serum: 70 mg/dL (ref 50–300)

## 2017-10-06 LAB — HEPATITIS B SURFACE ANTIGEN: Hepatitis B Surface Ag: NONREACTIVE

## 2017-10-06 LAB — IFE INTERPRETATION: Immunofix Electr Int: NOT DETECTED

## 2017-10-06 LAB — ANTI-DNA ANTIBODY, DOUBLE-STRANDED

## 2017-10-06 LAB — ANTI-SCLERODERMA ANTIBODY: SCLERODERMA (SCL-70) (ENA) ANTIBODY, IGG: NEGATIVE AI

## 2017-10-06 LAB — RNP ANTIBODY: RIBONUCLEIC PROTEIN(ENA) ANTIBODY, IGG: NEGATIVE AI

## 2017-10-06 LAB — ANTI-NUCLEAR AB-TITER (ANA TITER)

## 2017-10-06 LAB — SEDIMENTATION RATE: Sed Rate: 2 mm/h (ref 0–30)

## 2017-10-06 LAB — HEPATITIS C ANTIBODY
HEP C AB: NONREACTIVE
SIGNAL TO CUT-OFF: 0.02 (ref <1.00)

## 2017-10-06 LAB — SJOGRENS SYNDROME-B EXTRACTABLE NUCLEAR ANTIBODY: SSB (LA) (ENA) ANTIBODY, IGG: NEGATIVE AI

## 2017-10-06 LAB — ANTI-SMITH ANTIBODY: ENA SM Ab Ser-aCnc: <1 AI

## 2017-10-21 DIAGNOSIS — M5136 Other intervertebral disc degeneration, lumbar region: Secondary | ICD-10-CM | POA: Insufficient documentation

## 2017-10-21 DIAGNOSIS — M51369 Other intervertebral disc degeneration, lumbar region without mention of lumbar back pain or lower extremity pain: Secondary | ICD-10-CM

## 2017-10-21 DIAGNOSIS — M19042 Primary osteoarthritis, left hand: Secondary | ICD-10-CM

## 2017-10-21 DIAGNOSIS — M19041 Primary osteoarthritis, right hand: Secondary | ICD-10-CM | POA: Insufficient documentation

## 2017-10-21 HISTORY — DX: Other intervertebral disc degeneration, lumbar region: M51.36

## 2017-10-21 HISTORY — DX: Other intervertebral disc degeneration, lumbar region without mention of lumbar back pain or lower extremity pain: M51.369

## 2017-10-21 NOTE — Progress Notes (Signed)
Office Visit Note  Patient: [name]             Date of Birth: 1934-02-13           MRN: [id number]             PCP: [name], DO Referring: [name], DO Visit Date: 11/04/2017 Occupation: @GUAROCC @  Subjective:  [name]   History of Present Illness: [name] is a 84 y.o. female with history of severe [name].  She continues to have some bluish discoloration in her feet.  She also has constant tingling in her feet.  She states she has tingling sensation on top of her scalp, bilateral hands and bilateral feet.  She continues to have some discomfort in her hands from osteoarthritis as well.  Her lower back pain persist despite having surgery x2.  Activities of Daily Living:  Patient reports morning stiffness all day.   Patient Denies nocturnal pain.  Difficulty dressing/grooming: Denies Difficulty climbing stairs: Reports Difficulty getting out of chair: Reports Difficulty using hands for taps, buttons, cutlery, and/or writing: Reports  Review of Systems  Constitutional: Positive for fatigue. Negative for night sweats, weight gain and weight loss.  HENT: Positive for mouth dryness. Negative for mouth sores, trouble swallowing, trouble swallowing and nose dryness.   Eyes: Positive for dryness. Negative for pain, redness and visual disturbance.  Respiratory: Negative for cough, hemoptysis, shortness of breath and difficulty breathing.   Cardiovascular: Negative for chest pain, palpitations, hypertension, irregular heartbeat and swelling in legs/feet.  Gastrointestinal: Negative for blood in stool, constipation and diarrhea.  Endocrine: Negative for increased urination.  Genitourinary: Negative for difficulty urinating, painful urination and vaginal dryness.  Musculoskeletal: Positive for arthralgias, joint pain, myalgias, morning stiffness and myalgias. Negative for joint swelling, muscle weakness and muscle tenderness.  Skin: Negative for color  change, pallor, rash, hair loss, nodules/bumps, skin tightness, ulcers and sensitivity to sunlight.  Allergic/Immunologic: Negative for susceptible to infections.  Neurological: Negative for dizziness, numbness, headaches, memory loss, night sweats and weakness.  Hematological: Negative for swollen glands.  Psychiatric/Behavioral: Positive for sleep disturbance. Negative for depressed mood. The patient is not nervous/anxious.     PMFS History:  Patient Active Problem List   Diagnosis Date Noted  . Primary osteoarthritis of both hands 10/21/2017  . DDD (degenerative disc disease), lumbar 10/21/2017  . History of COPD 10/01/2017  . Former smoker 10/01/2017  . Pain in joint of right shoulder 06/25/2017  . Trochanteric bursitis of right hip 06/25/2017  . Primary [name] disease without gangrene-  hands and feet 06/02/2017  . Iron deficiency anemia, unspecified 04/23/2017  . GAD (generalized anxiety disorder) 04/23/2017  . Insomnia 04/23/2017  . S/P right TH revision 09/24/2016  . Type 2 diabetes mellitus without complication, without long-term current use of insulin ([facility]) 01/28/2016  . Medication refused- (any cholesterol ones) 01/20/2016  . Environmental and seasonal allergies 01/20/2016  . Chronic fatigue syndrome with fibromyalgia 01/20/2016  . Vitamin D deficiency 01/19/2016  . Prediabetes- 6.0 12/17 01/11/2016  . Adjustment disorder with mixed anxiety and depressed mood 01/11/2016  . Medication intolerance- statins- (make her FM much W) 01/10/2016  . Depression 11/24/2015  . Reaction, situational, acute, to stress(husband dying) 09/21/2015  . B12 deficiency 08/30/2015  . Dysuria 08/18/2015  . Chronic Fatigue- 2ndary to FM and Mood d/o  06/20/2015  . Memory loss   . Lumbar radiculopathy, chronic   . Herpes simplex labialis 04/03/2012  . Shingles 04/12/2011  . LBBB (left  bundle branch block) 11/13/2010  . MIGRAINE HEADACHE 05/30/2010  . Iron deficiency anemia 11/03/2009  .  Asthma, mild persistent 12/19/2008  . Hyperlipidemia 08/12/2007  . Anxiety state 08/12/2007  . Essential hypertension 08/12/2007  . Allergic rhinitis 08/12/2007  . G E R D 08/12/2007  . ARTHRITIS 08/12/2007  . Muscle pain, fibromyalgia 08/12/2007    Past Medical History:  Diagnosis Date  . ALLERGIC RHINITIS   . ANEMIA, IRON DEFICIENCY   . ANXIETY   . ARTHRITIS    s/p bilateral THR  . ASTHMA, EXTRINSIC   . Bronchitis   . Cataracts, both eyes   . Chronic constipation   . FIBROMYALGIA    fibromyalgia  . G E R D   . High cholesterol   . HYPERLIPIDEMIA   . HYPERTENSION   . Interstitial cystitis   . MIGRAINE HEADACHE     Family History  Problem Relation Age of Onset  . Arthritis Mother   . Hypertension Mother   . Heart disease Father   . Arthritis Father   . Hypertension Father   . Depression Sister   . Hyperlipidemia Sister   . Hypertension Other   . Hyperlipidemia Other    Past Surgical History:  Procedure Laterality Date  . ABDOMINAL HYSTERECTOMY    . ANTERIOR HIP REVISION Right 09/24/2016   Procedure: RIGHT HIP ACETABULUM AND FEMORAL HEAD REVISION WITH BONE GRAFT;  Surgeon: Paralee Cancel, MD;  Location: [facility];  Service: Orthopedics;  Laterality: Right;  . APPENDECTOMY    . BACK SURGERY    . CATARACT EXTRACTION    . COLONOSCOPY    . DOPPLER ECHOCARDIOGRAPHY    . ESOPHAGOGASTRODUODENOSCOPY ENDOSCOPY    . JOINT REPLACEMENT    . LUMBAR DISC SURGERY    . NM MYOVIEW LTD     stress test  . Ovarian cyst removed    . ROTATOR CUFF REPAIR Left 5-6 years ago   Dr Onnie Graham; [facility] at [facility]   . TOTAL HIP ARTHROPLASTY    . TOTAL SHOULDER ARTHROPLASTY     Social History   Social History Narrative   Lives with husband in a 3 story home.  Has 2 children.  Retired Glass blower/designer.  Education: Scientist, water quality.    Objective: Vital Signs: BP 131/67 (BP Location: Left Arm, Patient Position: Sitting, Cuff Size: Normal)   Pulse 90   Resp 16   Ht 5\' 4"  (1.626 m)   Wt  143 lb 6.4 oz (65 kg)   BMI 24.61 kg/m    Physical Exam  Constitutional: She is oriented to person, place, and time. She appears well-developed and well-nourished.  HENT:  Head: Normocephalic and atraumatic.  Eyes: Conjunctivae and EOM are normal.  Neck: Normal range of motion.  Cardiovascular: Normal rate, regular rhythm, normal heart sounds and intact distal pulses.  Pulmonary/Chest: Effort normal and breath sounds normal.  Abdominal: Soft. Bowel sounds are normal.  Lymphadenopathy:    She has no cervical adenopathy.  Neurological: She is alert and oriented to person, place, and time.  Skin: Skin is warm and dry. Capillary refill takes 2 to 3 seconds.  Psychiatric: She has a normal mood and affect. Her behavior is normal.  Nursing note and vitals reviewed.    Musculoskeletal Exam: C-spine was in good range of motion.  She has thoracic and lumbar discomfort and kyphosis.  Shoulder joints, elbow joints, wrist joints were in good range of motion.  She has DIP and PIP thickening in her hands consistent  with osteoarthritis.  Hip joints had limited range of motion.  Knee joints ankles MTPs were in good range of motion.  CDAI Exam: No CDAI exam completed.   Investigation: No additional findings.  Imaging: No results found.  Recent Labs: Lab Results  Component Value Date   WBC 7.0 10/01/2017   HGB 13.8 10/01/2017   PLT 244 10/01/2017   NA 135 10/01/2017   K 3.6 10/01/2017   CL 95 (L) 10/01/2017   CO2 26 10/01/2017   GLUCOSE 96 10/01/2017   BUN 13 10/01/2017   CREATININE 0.76 10/01/2017   BILITOT 0.4 10/01/2017   ALKPHOS 87 11/29/2016   AST 17 10/01/2017   ALT 17 10/01/2017   PROT 7.0 10/01/2017   PROT 6.9 10/01/2017   ALBUMIN 4.7 11/29/2016   CALCIUM 9.8 10/01/2017   GFRAA 85 10/01/2017   October 01, 2017 UA negative Lupus anticoagulant negative, beta-2 negative, anticardiolipin negative, ANA 1: 80 nucleolar, ENA negative, cryoglobulins negative, C3-C4 normal, ACE  negative, RF negative immunoglobulins normal except IgG low at 404, hepatitis B-, hepatitis C negative, G6PD normal, IFE negative  Speciality Comments: No specialty comments available.  Procedures:  No procedures performed Allergies: Morphine sulfate   Assessment / Plan:     Visit Diagnoses: [name] disease without gangrene - History of severe [name], ANA 1: 80 NO, ENA negative, cryo negative, RF negative, pan-ANCA negative, hepatitis B and C-read all autoimmune work-up is negative.  She has no clinical features of autoimmune disease.  She is already on amlodipine.  We had detailed discussion regarding possible use of Nitro-Dur.  Although I am hesitant as her blood pressure can drop with the addition of Nitro-Dur.  I have advised her to discuss this further with Dr. Alvester Chou.  Warm clothing and keeping core temperature warm was discussed.  Primary osteoarthritis of both hands - severe.  She continues to have some discomfort.  Joint protection muscle strengthening discussed.  S/P right TH revision - 1999 and 2018.  Despite the revision of her total hip she continues to have chronic pain.  DDD (degenerative disc disease), lumbar - Status post surgery x2.  She has chronic pain  Vitamin D deficiency-she is on vitamin D supplement.  Fibromyalgia -she continues to have some generalized pain from fibromyalgia.   Other medical problems are listed as follows:  History of migraine  Essential hypertension  LBBB (left bundle branch block)  History of COPD  History of gastroesophageal reflux (GERD)  History of hyperlipidemia  Former smoker - Quit smoking 40 years ago.     Orders: No orders of the defined types were placed in this encounter.  No orders of the defined types were placed in this encounter.   Face-to-face time spent with patient was 30 minutes. Greater than 50% of time was spent in counseling and coordination of care.  Follow-Up Instructions: Return in about 4  months (around 03/06/2018) for [name] syndrome, Osteoarthritis, DDD, Fibromyalgia.   Bo Merino, MD  Note - This record has been created using Editor, commissioning.  Chart creation errors have been sought, but may not always  have been located. Such creation errors do not reflect on  the standard of medical care.

## 2017-10-23 ENCOUNTER — Ambulatory Visit (INDEPENDENT_AMBULATORY_CARE_PROVIDER_SITE_OTHER): Payer: Medicare Other | Admitting: Family Medicine

## 2017-10-23 ENCOUNTER — Encounter: Payer: Self-pay | Admitting: Family Medicine

## 2017-10-23 VITALS — BP 138/74 | HR 75 | Ht 64.0 in | Wt 144.0 lb

## 2017-10-23 DIAGNOSIS — F4323 Adjustment disorder with mixed anxiety and depressed mood: Secondary | ICD-10-CM

## 2017-10-23 DIAGNOSIS — F411 Generalized anxiety disorder: Secondary | ICD-10-CM

## 2017-10-23 DIAGNOSIS — I10 Essential (primary) hypertension: Secondary | ICD-10-CM | POA: Diagnosis not present

## 2017-10-23 DIAGNOSIS — K219 Gastro-esophageal reflux disease without esophagitis: Secondary | ICD-10-CM | POA: Diagnosis not present

## 2017-10-23 DIAGNOSIS — R5382 Chronic fatigue, unspecified: Secondary | ICD-10-CM

## 2017-10-23 MED ORDER — DULOXETINE HCL 30 MG PO CPEP: 30.0000 mg | ORAL_CAPSULE | Freq: Every day | ORAL | 1 refills | Status: DC

## 2017-10-23 MED ORDER — LOSARTAN POTASSIUM 100 MG PO TABS: 100.0000 mg | ORAL_TABLET | Freq: Every day | ORAL | 1 refills | Status: DC

## 2017-10-23 MED ORDER — BUSPIRONE HCL 15 MG PO TABS: 15.0000 mg | ORAL_TABLET | Freq: Three times a day (TID) | ORAL | 5 refills | Status: DC

## 2017-10-23 MED ORDER — OMEPRAZOLE 20 MG PO CPDR: 20.0000 mg | DELAYED_RELEASE_CAPSULE | Freq: Every day | ORAL | 1 refills | Status: DC

## 2017-10-23 NOTE — Patient Instructions (Signed)
Please see this website for more information about fibromyalgia:  [email or web address].[email or web address].it  What Is Fibromyalgia?  Fibromyalgia is a common disorder that involves widespread pain, tenderness, fatigue, and other symptoms. It's not a form of arthritis, but like arthritis, it can interfere with a person's ability to perform everyday activities. An estimated 5 million American adults have fibromyalgia. Between 80 and 90 percent of people with fibromyalgia are women, but men and children can also have this condition.  More About Fibromyalgia  For more information about fibromyalgia, visit the [facility] of Arthritis and Musculoskeletal and Skin Diseases Web site.   What's the [employer] regarding alternative treatments for Fibromyalgia?  What do we know about the effectiveness of complementary health approaches for fibromyalgia?  Although some studies of tai chi, yoga, mindfulness meditation, and biofeedback for fibromyalgia have had promising results, the evidence is too limited to allow definite conclusions to be reached about whether these approaches are helpful.  It's uncertain whether acupuncture is helpful for fibromyalgia pain.  Vitamin D supplements may reduce pain in people with fibromyalgia who are deficient in this vitamin.  Some preliminary research on transcranial magnetic stimulation (TMS) for fibromyalgia symptoms has had promising results.  What do we know about the safety of complementary health approaches for fibromyalgia?   The mind and body approaches discussed here generally have good safety records. However, some may need to be adapted to make them safe and comfortable for people with fibromyalgia.  Some of the natural products that have been studied for fibromyalgia may have side effects or interact with medications.  Headaches sometimes occur as a side effect of TMS for fibromyalgia. TMS and other procedures involving magnets may  not be safe for people who have metal implants or medical devices such as pacemakers in their bodies.   What the [place] for Fibromyalgia: Mind and Body Practices:  -Acupuncture -Biofeedback -Guided Imagery -Massage Therapy -Meditative Movement Practices (Tai Chi, Marcelino Duster, and Yoga) -Mindfulness Meditation -Other Mind and Body Practices  Natural Products:  -It has been suggested that deficiencies in vitamin D might worsen fibromyalgia symptoms. In one study of women with fibromyalgia who had low vitamin D levels, 20 weeks of vitamin D supplementation led to a reduction in pain.  -Researchers are investigating whether low magnesium levels contribute to fibromyalgia and if magnesium supplements might help to reduce symptoms.  You may try magnesium malate 250 mg orally each night before bedtime  -Other natural products that have been studied for fibromyalgia include dietary supplements such as soy, S-adenosyl-L-methionine (SAMe), and creatine, and topical products containing capsaicin (the substance that gives chili peppers their heat). --> There's not enough evidence to determine whether these products are helpful.  - And remember, "natural" doesn't necessarily mean "safe." Natural products can have side effects, and some may interact with medications. Even vitamins and minerals can be harmful if taken in excessive amounts.  Make sure you work with your doctor and monitor for adequate levels.  Some examples of natural anti-inflammatories are: Ginger Root or capsules, Omega 3's, Tart Cherry extract or dried and Tumeric (capsules or Dr Algis Greenhouse Armandina Gemma Milk)    Other Complementary Approaches to Explore: -Balneotherapy -Homeopathy -Magnetic Therapies -Reiki   NCCIH-Funded Research  Recent NCCIH-sponsored studies have been investigating various aspects of complementary and integrative interventions for fibromyalgia, including:  -The effectiveness  of traditional [place] medicine for treating fibromyalgia -How tai chi compares to aerobic exercise as an adjunctive treatment for fibromyalgia symptoms -Whether brain  responses to placebos differ between people with fibromyalgia and healthy people.  More to Consider: Be aware that some complementary health approaches-particularly dietary supplements-may interact with conventional medical treatments. To learn more about using dietary supplements, see the [facility] fact sheet Using Dietary Supplements Wisely and the [facility] interactive module Understanding Drug-Supplement Interactions.  If you're considering a practitioner-provided complementary health approach such as acupuncture, check with your insurer to see if the services will be covered, and ask a trusted source (like your fibromyalgia health care provider or a nearby hospital or medical school) to recommend a practitioner.  Tell all your health care providers about any complementary or integrative health approaches you use. Give them a full picture of what you do to manage your health. This will help ensure coordinated and safe care.   For More Information:  1) [place]: The [place] provides information on [facility] and complementary and integrative health approaches, including publications and searches of [employer] of scientific and medical literature. The Clearinghouse does not provide medical advice, treatment recommendations, or referrals to practitioners. Toll-free in the [place].:   [phone] TTY (for deaf and hard-of-hearing callers):   [phone] Web site:   VoipGurus.fr E-mail:   info_0 .SouthExposed.es (link sends e-mail)  2) [place] (NIAMS):  The mission of NIAMS is to support research into the causes, treatment, and prevention of arthritis and musculoskeletal and skin diseases; the training of basic and clinical scientists to carry out  this research; and the dissemination of information on research progress in these diseases. Toll-free in the [place].:   1-877-22-NIAMS Web site:   Www.niams.SouthExposed.es  3) PubMed A service of the [employer] of Medicine, PubMed contains publication information and (in most cases) brief summaries of articles from scientific and medical journals. For guidance from [facility] on using PubMed, see How To Find Information About Complementary Health Approaches on PubMed. Web site:   https://www.davis.org/  4) MedlinePlus To provide resources that help answer health questions, MedlinePlus (a service of the [employer] of Medicine) brings together authoritative information from the [employer] as well as other Government agencies and [employer]. Web site:   Www.medlineplus.gov  ----------------------------------------------------------------------------------------------------------------------  Certified Practitioners in Acupuncture and [place] certified   [name], [place] Gender - Female [address]  [name] [name] [phone] Gender - Female  [name] [phone]   [name] [phone] Gender - Female [name] [address]   [place], [place] [phone] Gender - Female [address] [name] [address] [email or web address]    [name] [phone] Gender - Female [name] [address] [name] [phone] Gender - Female [place] [name] [phone] Gender - Female [address]  Google Map [name] [address]   [place], [place] [phone] Gender - Female [address] [name]  [name] [name] [address]   [name] [phone] Gender - Female [place] [address] [name] [address]   [place], [place] Gender - Female [address]  [name] [address]

## 2017-10-23 NOTE — Progress Notes (Signed)
Impression and Recommendations:    1. Adjustment disorder with mixed anxiety and depressed mood   2. Anxiety state:   h/o panic d/o   3. Chronic fatigue- 2ndary to FM and Mood d/o    4. Essential hypertension   5. Anxiety state   6. Gastroesophageal reflux disease, esophagitis presence not specified    -Patient recently had a bunch of blood work with her rheumatologist Dr. Dora Barr For our purposes, CBC and CMP were within normal limits.   1. Mood/Adjustment Disorder - Increased dose of Buspar today from 1/2 tablet to full tablet with reviewed goal to "take more of the edge off" for the patient.  See changes to med list below. - Patient tolerates meds will with no S-E.  - Advised patient that the Buspar should feel as though it "takes the edge off."  - Strongly emphasized to avoid using xanax and only incorporating xanax for emergency mood management VERY RARELY.  -Explained that I think CBD oil has utility as evidenced by many of my patients that find benefit with usage of it but as a physician, it is not FDA approved and hence, I could not comment on appropriate dosing, how often etc.  Told patient she can use on her own volition.  2. Essential Hypertension - Blood pressure is at goal at this time.  Continue medications as prescribed. - Will continue to monitor.  - Lifestyle changes such as dash diet and engaging in a regular exercise program discussed with patient.  Educational handouts provided at patient's request.  - Ambulatory BP monitoring encouraged. Keep log and bring in next OV  3. Specialty Care - Management of R Hip Replacement; [name] - Encouraged patient to continue to follow up with specialty management as recommended, including Dr. Alvan Barr of Orthopedics and Dr. Estanislado Barr of Rheumatology.  4. BMI Counseling Explained to patient what BMI refers to, and what it means medically.    Told patient to think about it as a "medical risk stratification measurement"  and how increasing BMI is associated with increasing risk/ or worsening state of various diseases such as hypertension, hyperlipidemia, diabetes, premature OA, depression etc.  American Heart Association guidelines for healthy diet, basically Mediterranean diet, and exercise guidelines of 30 minutes 5 days per week or more discussed in detail.  Health counseling performed.  All questions answered.  5. Lifestyle & Preventative Health Maintenance - Advised patient to continue working toward exercising to improve overall mental, physical, and emotional health.    - Encouraged patient to engage in daily physical activity, especially a formal exercise routine.  Recommended that the patient eventually strive for at least 150 minutes of moderate cardiovascular activity per week according to guidelines established by the [facility]   - Healthy dietary habits encouraged, including low-carb, and high amounts of lean protein in diet.   - Patient should also consume adequate amounts of water - half of body weight in oz of water per day.   Education and routine counseling performed. Handouts provided.  6. Follow-Up - Continue to return for regularly scheduled chronic follow-up.  - Patient knows to return to the clinic PRN for acute concerns, and to seek emergency care if experiencing alarming symptoms.   Medications Discontinued During This Encounter  Medication Reason  . busPIRone (BUSPAR) 15 MG tablet   . DULoxetine (CYMBALTA) 30 MG capsule Reorder  . losartan (COZAAR) 100 MG tablet Reorder  . omeprazole (PRILOSEC) 20 MG capsule Reorder     Meds ordered this  encounter  Medications  . busPIRone (BUSPAR) 15 MG tablet    Sig: Take 1 tablet (15 mg total) by mouth 3 (three) times daily.    Dispense:  90 tablet    Refill:  5  . DULoxetine (CYMBALTA) 30 MG capsule    Sig: Take 1 capsule (30 mg total) by mouth daily.    Dispense:  90 capsule    Refill:  1  . losartan (COZAAR) 100 MG tablet    Sig:  Take 1 tablet (100 mg total) by mouth daily.    Dispense:  90 tablet    Refill:  1  . omeprazole (PRILOSEC) 20 MG capsule    Sig: Take 1 capsule (20 mg total) by mouth daily.    Dispense:  90 capsule    Refill:  1    Gross side effects, risk and benefits, and alternatives of medications and treatment plan in general discussed with patient.  Patient is aware that all medications have potential side effects and we are unable to predict every side effect or drug-drug interaction that may occur.   Patient will call with any questions prior to using medication if they have concerns.  Expresses verbal understanding and consents to current therapy and treatment regimen.  No barriers to understanding were identified.  Red flag symptoms and signs discussed in detail.  Patient expressed understanding regarding what to do in case of emergency\urgent symptoms  Please see AVS handed out to patient at the end of our visit for further patient instructions/ counseling done pertaining to today's office visit.   Return for 74mo - mood/ anxiety, BP, GERD, B12, chronic fatigue.     Note:  This document was prepared using Dragon voice recognition software and may include unintentional dictation errors.  This document serves as a record of services personally performed by [name], DO. It was created on her behalf by [name], a trained medical scribe. The creation of this record is based on the scribe's personal observations and the provider's statements to them.   I have reviewed the above medical documentation for accuracy and completeness and I concur.  [name] 10/23/17 9:42 AM   ----------------------------------------------------------------------------------------------------------------------   Subjective:    CC:  Chief Complaint  Patient presents with  . Follow-up    HPI: [name] is a 84 y.o. female who presents to [place] at [facility]  today for follow-up of mood.   Orthopedic Concerns - Hip Replacement & Recent Imaging Patient recently saw Dr. Alvan Barr of Orthopedics for concerns about her hip replacement.  Had imaging done, NM Bone Scan on 08/08/2017. She was told to come back in six months for further review.  Lifestyle Habits Goes to the [facility] twice weekly for a class in water aerobics "hopefully to help the hip."  Notes that "of course, the back, we can't do anything about."  "Sometimes I think that because of the back, I can't do things."  Rheumatology She saw Dr. Estanislado Barr recently.  "She ran hundreds of tests."  She returns to Dr. Estanislado Barr in the middle of August to get the results.  No changes to treatment, no new concerns.  Notes that Dr. Estanislado Barr "doesn't know yet" and will be reviewing this next appointment.  States "right now I've got this fierce tingling in my hands."  This is a common issue for her with her fibromyalgia.  Mood Patient was started on Buspar in March.  She feels that it's "not really working."  Thought  she slept better in the beginning, but now isn't sure.  Notes "I only had one real panic attack."  Patient says she was "talked into trying CBD oil, and that really helped."  She's been using drops/gummies of CBD oil and states that she can tell a difference using them.  "Once in a while if I really got in a tizzy, I'd do a gummy, and I felt better."  Patient states she has only used xanax once since last appointment, which is one time in six months.    Depression screen [facility] 2/9 10/23/2017 06/02/2017 04/23/2017  Decreased Interest 1 1 0  Down, Depressed, Hopeless 1 1 1   PHQ - 2 Score 2 2 1   Altered sleeping 1 0 0  Tired, decreased energy 1 1 1   Change in appetite 0 0 0  Feeling bad or failure about yourself  0 1 1  Trouble concentrating 0 0 0  Moving slowly or fidgety/restless 0 0 0  Suicidal thoughts 0 0 0  PHQ-9 Score 4 4 3   Difficult doing work/chores Not difficult at all - Not difficult  at all  Some recent data might be hidden     GAD 7 : Generalized Anxiety Score 10/23/2017 06/02/2017 04/23/2017 01/07/2017  Nervous, Anxious, on Edge 1 3 1 2   Control/stop worrying 3 3 3 2   Worry too much - different things 3 2 1 1   Trouble relaxing 3 3 3 2   Restless 0 0 0 1  Easily annoyed or irritable 1 2 0 2  Afraid - awful might happen 0 0 0 0  Total GAD 7 Score 11 13 8 10   Anxiety Difficulty - - - Somewhat difficult     Wt Readings from Last 3 Encounters:  10/23/17 144 lb (65.3 kg)  10/01/17 141 lb (64 kg)  08/05/17 141 lb (64 kg)   BP Readings from Last 3 Encounters:  10/23/17 138/74  10/01/17 128/68  08/05/17 (!) 144/80   Pulse Readings from Last 3 Encounters:  10/23/17 75  10/01/17 84  08/05/17 87   BMI Readings from Last 3 Encounters:  10/23/17 24.72 kg/m  10/01/17 24.20 kg/m  08/05/17 24.20 kg/m         Patient Care Team    Relationship Specialty Notifications Start End  [name], DO PCP - General Family Medicine  08/30/15   Ladene Artist, MD Referring Physician Gastroenterology  07/02/10   Myrlene Broker, MD Referring Physician Urology  07/02/10   Clent Jacks, MD Referring Physician Ophthalmology  07/02/10   [name], MD Referring Physician Cardiology  07/02/10   Danella Sensing, MD Consulting Physician Dermatology  11/09/15   Juanito Doom, MD Consulting Physician Pulmonary Disease  11/09/15   Suella Broad, MD Consulting Physician Physical Medicine and Rehabilitation  11/09/15    Comment: pain mgt :  injections and pain meds  Rockne Menghini, RPH-CPP Pharmacist Cardiology  11/19/15    Comment:  " [facility]" - med mgt of her HTN per request of Dr Alvester Chou- Cards  Alda Berthold, DO Consulting Physician Neurology  12/17/15   Harrington Challenger, [facility]  Pharmacist  07/29/16    Comment: clinical pharm-D in HTN clinic  Paralee Cancel, MD Consulting Physician Orthopedic Surgery  10/23/17    Comment: R hip replacement  Bo Merino, MD Consulting Physician Rheumatology  10/23/17      Patient Active Problem List   Diagnosis Date Noted  . Medication refused- (any cholesterol ones) 01/20/2016  Priority: High  . Prediabetes- 6.0 12/17 01/11/2016    Priority: High  . Adjustment disorder with mixed anxiety and depressed mood 01/11/2016    Priority: High  . Asthma, mild persistent 12/19/2008    Priority: High  . Hyperlipidemia 08/12/2007    Priority: High  . Essential hypertension 08/12/2007    Priority: High  . Chronic fatigue syndrome with fibromyalgia 01/20/2016    Priority: Medium  . Depression 11/24/2015    Priority: Medium  . B12 deficiency 08/30/2015    Priority: Medium  . Iron deficiency anemia 11/03/2009    Priority: Medium  . Anxiety state 08/12/2007    Priority: Medium  . Muscle pain, fibromyalgia 08/12/2007    Priority: Medium  . Environmental and seasonal allergies 01/20/2016    Priority: Low  . Vitamin D deficiency 01/19/2016    Priority: Low  . Chronic Fatigue- 2ndary to FM and Mood d/o  06/20/2015    Priority: Low  . Memory loss     Priority: Low  . Lumbar radiculopathy, chronic     Priority: Low  . LBBB (left bundle branch block) 11/13/2010    Priority: Low  . G E R D 08/12/2007    Priority: Low  . Primary osteoarthritis of both hands 10/21/2017  . DDD (degenerative disc disease), lumbar 10/21/2017  . History of COPD 10/01/2017  . Former smoker 10/01/2017  . Pain in joint of right shoulder 06/25/2017  . Trochanteric bursitis of right hip 06/25/2017  . Primary [name] disease without gangrene-  hands and feet 06/02/2017  . Iron deficiency anemia, unspecified 04/23/2017  . GAD (generalized anxiety disorder) 04/23/2017  . Insomnia 04/23/2017  . S/P right TH revision 09/24/2016  . Type 2 diabetes mellitus without complication, without long-term current use of insulin ([place]) 01/28/2016  . Medication intolerance- statins- (make her FM much W) 01/10/2016  . Reaction,  situational, acute, to stress(husband dying) 09/21/2015  . Dysuria 08/18/2015  . Herpes simplex labialis 04/03/2012  . Shingles 04/12/2011  . MIGRAINE HEADACHE 05/30/2010  . Allergic rhinitis 08/12/2007  . ARTHRITIS 08/12/2007    Past Medical history, Surgical history, Family history, Social history, Allergies and Medications have been entered into the medical record, reviewed and changed as needed.    Current Meds  Medication Sig  . acetaminophen (TYLENOL) 500 MG tablet Take by mouth as needed.  Marland Kitchen albuterol (PROAIR HFA) 108 (90 Base) MCG/ACT inhaler Inhale 2 puffs into the lungs every 6 (six) hours as needed.  . ALPRAZolam (XANAX) 0.25 MG tablet 1 by mouth only PRN severe panic not controlled via other methods  . amLODipine (NORVASC) 5 MG tablet Take 5 mg by mouth daily.  . busPIRone (BUSPAR) 15 MG tablet Take 1 tablet (15 mg total) by mouth 3 (three) times daily.  . cetirizine (ZYRTEC) 10 MG tablet Take 10 mg by mouth daily.   . chlorthalidone (HYGROTON) 25 MG tablet TAKE 1/2 TABLET BY MOUTH DAILY  . chlorthalidone (HYGROTON) 25 MG tablet TAKE 1/2 TABLET BY MOUTH DAILY  . Cholecalciferol (VITAMIN D3) 5000 units TABS 5,000 IU OTC vitamin D3 daily.  Marland Kitchen docusate sodium (COLACE) 100 MG capsule Take 1 capsule (100 mg total) by mouth 2 (two) times daily.  . DULoxetine (CYMBALTA) 30 MG capsule Take 1 capsule (30 mg total) by mouth daily.  . ferrous sulfate (FERROUSUL) 325 (65 FE) MG tablet Take 1 tablet (325 mg total) by mouth 3 (three) times daily with meals.  . fluticasone (FLONASE) 50 MCG/ACT nasal spray USE 2 SPRAYS  INTO EACH NOSTRIL EVERY DAY  . losartan (COZAAR) 100 MG tablet Take 1 tablet (100 mg total) by mouth daily.  . methocarbamol (ROBAXIN) 500 MG tablet as needed.  . montelukast (SINGULAIR) 10 MG tablet Take 1 tablet (10 mg total) by mouth at bedtime.  Marland Kitchen omeprazole (PRILOSEC) 20 MG capsule Take 1 capsule (20 mg total) by mouth daily.  . SYMBICORT 80-4.5 MCG/ACT inhaler INHALE 2  PUFFS BY MOUTH TWICE DAILY  . traMADol (ULTRAM) 50 MG tablet Take by mouth as needed.  . vitamin B-12 (CYANOCOBALAMIN) 1000 MCG tablet Take 1 tablet (1,000 mcg total) by mouth daily.  . [DISCONTINUED] busPIRone (BUSPAR) 15 MG tablet Take 0.5 tablets (7.5 mg total) by mouth 3 (three) times daily.  . [DISCONTINUED] DULoxetine (CYMBALTA) 30 MG capsule TAKE 1 CAPSULE BY MOUTH DAILY  . [DISCONTINUED] losartan (COZAAR) 100 MG tablet TAKE 1 TABLET BY MOUTH DAILY  . [DISCONTINUED] omeprazole (PRILOSEC) 20 MG capsule TAKE 1 CAPSULE BY MOUTH DAILY    Allergies:  Allergies  Allergen Reactions  . Morphine Sulfate Other (See Comments)    Makes her hyper     Review of Systems: Review of Systems: General:   No F/C, wt loss Pulm:   No DIB, SOB, pleuritic chest pain Card:  No CP, palpitations Abd:  No n/v/d or pain Ext:  No inc edema from baseline Psych: no SI/ HI    Objective:   Blood pressure 138/74, pulse 75, height 5\' 4"  (1.626 m), weight 144 lb (65.3 kg), SpO2 96 %. Body mass index is 24.72 kg/m. General:  Well Developed, well nourished, appropriate for stated age.  Neuro:  Alert and oriented,  extra-ocular muscles intact  HEENT:  Normocephalic, atraumatic, neck supple, no carotid bruits appreciated  Skin:  no gross rash, warm, pink. Cardiac:  RRR, S1 S2 Respiratory:  ECTA B/L and A/P, Not using accessory muscles, speaking in full sentences- unlabored. Vascular:  Ext warm, no cyanosis apprec.; cap RF less 2 sec. Psych:  No HI/SI, judgement and insight good, Euthymic mood. Full Affect.    Recent Results (from the past 2160 hour(s))  CBC with Differential/Platelet     Status: None   Collection Time: 10/01/17 11:14 AM  Result Value Ref Range   WBC 7.0 3.8 - 10.8 Thousand/uL   RBC 4.61 3.80 - 5.10 Million/uL   Hemoglobin 13.8 11.7 - 15.5 g/dL   HCT 41.1 35.0 - 45.0 %   MCV 89.2 80.0 - 100.0 fL   MCH 29.9 27.0 - 33.0 pg   MCHC 33.6 32.0 - 36.0 g/dL   RDW 13.4 11.0 - 15.0 %    Platelets 244 140 - 400 Thousand/uL   MPV 9.1 7.5 - 12.5 fL   Neutro Abs 5,103 1,500 - 7,800 cells/uL   Lymphs Abs 1,274 850 - 3,900 cells/uL   WBC mixed population 560 200 - 950 cells/uL   Eosinophils Absolute 42 15 - 500 cells/uL   Basophils Absolute 21 0 - 200 cells/uL   Neutrophils Relative % 72.9 %   Total Lymphocyte 18.2 %   Monocytes Relative 8.0 %   Eosinophils Relative 0.6 %   Basophils Relative 0.3 %  COMPLETE METABOLIC PANEL WITH GFR     Status: Abnormal   Collection Time: 10/01/17 11:14 AM  Result Value Ref Range   Glucose, Bld 96 65 - 99 mg/dL    Comment: .            Fasting reference interval .    BUN 13 7 -  25 mg/dL   Creat 0.76 0.60 - 0.88 mg/dL    Comment: For patients >44 years of age, the reference limit for Creatinine is approximately 13% higher for people identified as African-American. .    GFR, Est Non African American 73 > OR = 60 mL/min/1.74m2   GFR, Est African American 85 > OR = 60 mL/min/1.55m2   BUN/Creatinine Ratio NOT APPLICABLE 6 - 22 (calc)   Sodium 135 135 - 146 mmol/L   Potassium 3.6 3.5 - 5.3 mmol/L   Chloride 95 (L) 98 - 110 mmol/L   CO2 26 20 - 32 mmol/L   Calcium 9.8 8.6 - 10.4 mg/dL   Total Protein 7.0 6.1 - 8.1 g/dL   Albumin 4.7 3.6 - 5.1 g/dL   Globulin 2.3 1.9 - 3.7 g/dL (calc)   AG Ratio 2.0 1.0 - 2.5 (calc)   Total Bilirubin 0.4 0.2 - 1.2 mg/dL   Alkaline phosphatase (APISO) 71 33 - 130 U/L   AST 17 10 - 35 U/L   ALT 17 6 - 29 U/L  Urinalysis, Routine w reflex microscopic     Status: Abnormal   Collection Time: 10/01/17 11:14 AM  Result Value Ref Range   Color, Urine YELLOW YELLOW   APPearance CLEAR CLEAR   Specific Gravity, Urine 1.011 1.001 - 1.03   pH 7.0 5.0 - 8.0   Glucose, UA NEGATIVE NEGATIVE   Bilirubin Urine NEGATIVE NEGATIVE   Ketones, ur NEGATIVE NEGATIVE   Hgb urine dipstick NEGATIVE NEGATIVE   Protein, ur NEGATIVE NEGATIVE   Nitrite NEGATIVE NEGATIVE   Leukocytes, UA TRACE (A) NEGATIVE   WBC, UA NONE  SEEN 0 - 5 /HPF   RBC / HPF NONE SEEN 0 - 2 /HPF   Squamous Epithelial / LPF NONE SEEN < OR = 5 /HPF   Bacteria, UA NONE SEEN NONE SEEN /HPF   Hyaline Cast NONE SEEN NONE SEEN /LPF  ANA     Status: Abnormal   Collection Time: 10/01/17 11:14 AM  Result Value Ref Range   Anti Nuclear Antibody(ANA) POSITIVE (A) NEGATIVE    Comment: ANA IFA is a first line screen for detecting the presence of up to approximately 150 autoantibodies in various autoimmune diseases. A positive ANA IFA result is suggestive of autoimmune disease and reflexes to titer and pattern. Further laboratory testing may be considered if clinically indicated. . Visit Physician FAQs for interpretation of all antibodies in the [place], prevalence, and association with diseases at http://education.[facility].com/ [email or web address] .   Anti-DNA antibody, double-stranded     Status: None   Collection Time: 10/01/17 11:14 AM  Result Value Ref Range   ds DNA Ab <1 IU/mL    Comment:                            IU/mL       Interpretation                            < or = 4    Negative                            5-9         Indeterminate                            > or = 10  Positive .   C3 and C4     Status: None   Collection Time: 10/01/17 11:14 AM  Result Value Ref Range   C3 Complement 142 mg/dL    Comment: Reference Range <1 year:          Not established 1-14 years:       82-173 15-80 years:      83-193 > or = 81 years   Not established .    C4 Complement 29 mg/dL    Comment: Reference Range <1 year:          Not established 1-14 years:       13-46 15-80 years:      15-57 > or = 81 years   Not established .   Sedimentation rate     Status: None   Collection Time: 10/01/17 11:14 AM  Result Value Ref Range   Sed Rate 2 0 - 30 mm/h  Rheumatoid factor     Status: None   Collection Time: 10/01/17 11:14 AM  Result Value Ref Range   Rhuematoid fact SerPl-aCnc <14 <14 IU/mL  Angiotensin converting enzyme      Status: None   Collection Time: 10/01/17 11:14 AM  Result Value Ref Range   Angiotensin-Converting Enzyme 22 9 - 67 U/L  Pan-ANCA     Status: None   Collection Time: 10/01/17 11:14 AM  Result Value Ref Range   ANCA Screen NEGATIVE NEGATIVE    Comment: ANCA Screen includes evaluation for p-ANCA, c-ANCA and atypical p-ANCA. A positive ANCA screen reflexes to titer and pattern(s), e.g., cytoplasmic pattern (c-ANCA), perinuclear pattern (p-ANCA), or atypical p-ANCA pattern.  c-ANCA and p-ANCA are observed in vasculitis, whereas atypical p-ANCA is observed in IBD (Inflammatory Bowel Disease). Atypical p-ANCA is  detected in about 55% to 80% of patients with ulcerative colitis but only 5% to 25% of patients with Crohn's disease. .    Myeloperoxidase Abs <1.0 AI    Comment:      Value        Interpretation      -----        --------------      <1.0         No Antibody Detected      > or = 1.0   Antibody Detected . Autoantibodies to myeloperoxidase (MPO) are commonly associated with the following small-vessel vasculitides: microscopic polyangiitis, polyarteritis nodosa, Churg-Strauss syndrome, necrotizing and crescentic glomerulonephritis and occasionally granulomatosis with polyangiitis (GPA, Wegener's). The perinuclear IFA pattern, (p-ANCA) is based largely on autoantibody to  myeloperoxidase which serves as the primary antigen. These autoantibodies are present in active disease. .    Serine Protease 3 <1.0 AI    Comment:      Value        Interpretation      -----        --------------      <1.0         No Antibody Detected      > or = 1.0   Antibody Detected . Autoantibodies to proteinase-3 (PR-3) are accepted as characteristic for granulomatosis with polyangiitis (GPA, Wegener's), and are detectable in 95% of the histologically proven cases. The cytoplasmic IFA pattern, (c-ANCA), is based largely on autoantibody to PR-3 which serves as the primary antigen. These  autoantibodies are present in active disease. Pierre Bali     Status: None   Collection Time: 10/01/17 11:14 AM  Result Value Ref Range   Cryoglobulin, Qualitative Analysis  None Detected None Detec  Anti-scleroderma antibody     Status: None   Collection Time: 10/01/17 11:14 AM  Result Value Ref Range   Scleroderma (Scl-70) (ENA) Antibody, IgG <1.0 NEG <1.0 NEG AI  RNP Antibody     Status: None   Collection Time: 10/01/17 11:14 AM  Result Value Ref Range   Ribonucleic Protein(ENA) Antibody, IgG <1.0 NEG <1.0 NEG AI  Anti-Smith antibody     Status: None   Collection Time: 10/01/17 11:14 AM  Result Value Ref Range   ENA SM Ab Ser-aCnc <1.0 NEG <1.0 NEG AI  Sjogrens syndrome-A extractable nuclear antibody     Status: None   Collection Time: 10/01/17 11:14 AM  Result Value Ref Range   SSA (Ro) (ENA) Antibody, IgG <1.0 NEG <1.0 NEG AI  Sjogrens syndrome-B extractable nuclear antibody     Status: None   Collection Time: 10/01/17 11:14 AM  Result Value Ref Range   SSB (La) (ENA) Antibody, IgG <1.0 NEG <1.0 NEG AI  Beta-2 glycoprotein antibodies     Status: None   Collection Time: 10/01/17 11:14 AM  Result Value Ref Range   Beta-2 Glyco I IgG <9 < OR = 20 SGU   Beta-2 Glyco 1 IgM <9 < OR = 20 SMU   Beta-2 Glyco 1 IgA <9 < OR = 20 SAU  Cardiolipin antibodies, IgG, IgM, IgA     Status: None   Collection Time: 10/01/17 11:14 AM  Result Value Ref Range   Anticardiolipin IgA <11 APL    Comment:     Value       Interpretation     -----       --------------     < or = 11   Negative     12 - 20     Indeterminate     21 - 80     Low to Medium Positive     >80         High Positive      Anticardiolipin IgG <14 GPL    Comment:     Value       Interpretation     -----       --------------     < or = 14   Negative     15 - 20     Indeterminate     21 - 80     Low to Medium Positive     >80         High Positive    Anticardiolipin IgM <12 MPL    Comment:     Value        Interpretation     -----       --------------     < or = 12   Negative     13 - 20     Indeterminate     21 - 80     Low to Medium Positive     >80         High Positive . The antiphospholipid antibody syndrome (APS) is a  clinical-pathologic correlation that includes a  clinical event (e.g. thrombosis, pregnancy loss,  thrombocytopenia) and persistent positive  antiphospholipid antibodies  (IgM or IgG ACA >40 MPL/GPL, IgM or IgG anti-b2GPI antibodies or a lupus anticoagulant). International  consensus guidelines for APS suggest waiting at least  12 weeks before retesting to confirm antibody  persistence.  The [employer]  [facility] immunological classification  criteria for systemic  lupus erythematosus (SLE)  include testing for isotype IgA, which has yet to be  incorporated into APS criteria. Low level  antiphospholipid antibodies may sometimes be detected  in the setting of infection, drug therapy or aging.   Lupus Anticoagulant Eval w/Reflex     Status: None   Collection Time: 10/01/17 11:14 AM  Result Value Ref Range   Lupus Anticoagulant see note     Comment: A Lupus Anticoagulant is not detected. Marland Kitchen Reference Range:  Not Detected . For additional information, please refer to http://education.quest[facility].com/faq/FAQ01v2 . (This link is being provided for informational/ educational purposes only.) . Marland Kitchen This interpretation is based on the following test results. Marland Kitchen    PTT-LA Screen 36 <=40 sec   dRVVT 39 <=45 sec   PT, Mixing Interp Not Indicated   Glucose 6 phosphate dehydrogenase     Status: None   Collection Time: 10/01/17 11:14 AM  Result Value Ref Range   G-6PDH 15.2 7.0 - 20.5 U/g Hgb  Serum protein electrophoresis with reflex     Status: Abnormal   Collection Time: 10/01/17 11:14 AM  Result Value Ref Range   Total Protein 6.9 6.1 - 8.1 g/dL   Albumin ELP 4.7 3.8 - 4.8 g/dL   Alpha 1 0.3 0.2 - 0.3 g/dL   Alpha 2 0.9 0.5  - 0.9 g/dL   Beta Globulin 0.4 0.4 - 0.6 g/dL   Beta 2 0.2 0.2 - 0.5 g/dL   Gamma Globulin 0.4 (L) 0.8 - 1.7 g/dL   Abnormal Protein Band1 NOTE NONE DETEC g/dL   SPE Interp.      Comment: . A poorly-defined band of restricted protein mobility is detected in the gamma globulins. It is unlikely that this may represent a monoclonal protein; however, immunofixation analysis is available if clinically indicated. .   IgG, IgA, IgM     Status: Abnormal   Collection Time: 10/01/17 11:14 AM  Result Value Ref Range   Immunoglobulin A 53 20 - 320 mg/dL   IgG (Immunoglobin G), Serum 404 (L) 600 - 1,540 mg/dL   IgM, Serum 70 50 - 300 mg/dL  Hepatitis B surface antigen     Status: None   Collection Time: 10/01/17 11:14 AM  Result Value Ref Range   Hepatitis B Surface Ag NON-REACTIVE NON-REACTI  Hepatitis B core antibody, IgM     Status: None   Collection Time: 10/01/17 11:14 AM  Result Value Ref Range   Hep B C IgM NON-REACTIVE NON-REACTI  Hepatitis C antibody     Status: None   Collection Time: 10/01/17 11:14 AM  Result Value Ref Range   Hepatitis C Ab NON-REACTIVE NON-REACTI   SIGNAL TO CUT-OFF 0.02 <1.00    Comment: . HCV antibody was non-reactive. There is no laboratory  evidence of HCV infection. . In most cases, no further action is required. However, if recent HCV exposure is suspected, a test for HCV RNA (test code [id number]) is suggested. . For additional information please refer to http://education.quest[facility].com/faq/FAQ22v1 (This link is being provided for informational/ educational purposes only.) .   IFE Interpretation     Status: None   Collection Time: 10/01/17 11:14 AM  Result Value Ref Range   Immunofix Electr Int NO MONOCLONAL PROTEIN DETECTED   Anti-nuclear ab-titer (ANA titer)     Status: Abnormal   Collection Time: 10/01/17 11:14 AM  Result Value Ref Range   ANA Pattern 1 NUCLEOLAR (A)     Comment: Nucleolar pattern is associated with systemic sclerosis  (  scleroderma), systemic sclerosis/ polymyositis overlap and Sjogren's syndrome.    ANA Titer 1 1:80 (H) titer    Comment: A low level ANA titer may be present in pre-clinical autoimmune diseases and normal individuals.                 Reference Range                 <1:40        Negative                 1:40-1:80    Low Antibody Level                 >1:80        Elevated Antibody Level .

## 2017-11-04 ENCOUNTER — Ambulatory Visit (INDEPENDENT_AMBULATORY_CARE_PROVIDER_SITE_OTHER): Payer: Medicare Other | Admitting: Rheumatology

## 2017-11-04 ENCOUNTER — Encounter: Payer: Self-pay | Admitting: Rheumatology

## 2017-11-04 VITALS — BP 131/67 | HR 90 | Resp 16 | Ht 64.0 in | Wt 143.4 lb

## 2017-11-04 DIAGNOSIS — M797 Fibromyalgia: Secondary | ICD-10-CM

## 2017-11-04 DIAGNOSIS — M5136 Other intervertebral disc degeneration, lumbar region: Secondary | ICD-10-CM

## 2017-11-04 DIAGNOSIS — M19042 Primary osteoarthritis, left hand: Secondary | ICD-10-CM | POA: Diagnosis not present

## 2017-11-04 DIAGNOSIS — M19041 Primary osteoarthritis, right hand: Secondary | ICD-10-CM

## 2017-11-04 DIAGNOSIS — I7300 Raynaud's syndrome without gangrene: Secondary | ICD-10-CM

## 2017-11-04 DIAGNOSIS — Z96649 Presence of unspecified artificial hip joint: Secondary | ICD-10-CM

## 2017-11-04 DIAGNOSIS — E559 Vitamin D deficiency, unspecified: Secondary | ICD-10-CM | POA: Diagnosis not present

## 2017-11-17 DIAGNOSIS — M5136 Other intervertebral disc degeneration, lumbar region: Secondary | ICD-10-CM | POA: Diagnosis not present

## 2017-11-17 DIAGNOSIS — M9903 Segmental and somatic dysfunction of lumbar region: Secondary | ICD-10-CM | POA: Diagnosis not present

## 2017-11-17 DIAGNOSIS — M9904 Segmental and somatic dysfunction of sacral region: Secondary | ICD-10-CM | POA: Diagnosis not present

## 2017-11-17 DIAGNOSIS — M9905 Segmental and somatic dysfunction of pelvic region: Secondary | ICD-10-CM | POA: Diagnosis not present

## 2017-11-18 DIAGNOSIS — M9904 Segmental and somatic dysfunction of sacral region: Secondary | ICD-10-CM | POA: Diagnosis not present

## 2017-11-18 DIAGNOSIS — M5136 Other intervertebral disc degeneration, lumbar region: Secondary | ICD-10-CM | POA: Diagnosis not present

## 2017-11-18 DIAGNOSIS — M9903 Segmental and somatic dysfunction of lumbar region: Secondary | ICD-10-CM | POA: Diagnosis not present

## 2017-11-18 DIAGNOSIS — M9905 Segmental and somatic dysfunction of pelvic region: Secondary | ICD-10-CM | POA: Diagnosis not present

## 2017-11-19 DIAGNOSIS — M9905 Segmental and somatic dysfunction of pelvic region: Secondary | ICD-10-CM | POA: Diagnosis not present

## 2017-11-19 DIAGNOSIS — M9903 Segmental and somatic dysfunction of lumbar region: Secondary | ICD-10-CM | POA: Diagnosis not present

## 2017-11-19 DIAGNOSIS — M9904 Segmental and somatic dysfunction of sacral region: Secondary | ICD-10-CM | POA: Diagnosis not present

## 2017-11-19 DIAGNOSIS — M5136 Other intervertebral disc degeneration, lumbar region: Secondary | ICD-10-CM | POA: Diagnosis not present

## 2017-11-20 ENCOUNTER — Encounter: Payer: Self-pay | Admitting: Nurse Practitioner

## 2017-11-20 ENCOUNTER — Ambulatory Visit (INDEPENDENT_AMBULATORY_CARE_PROVIDER_SITE_OTHER): Payer: Medicare Other | Admitting: Nurse Practitioner

## 2017-11-20 DIAGNOSIS — J4521 Mild intermittent asthma with (acute) exacerbation: Secondary | ICD-10-CM | POA: Diagnosis not present

## 2017-11-20 DIAGNOSIS — J3089 Other allergic rhinitis: Secondary | ICD-10-CM

## 2017-11-20 DIAGNOSIS — M5136 Other intervertebral disc degeneration, lumbar region: Secondary | ICD-10-CM | POA: Diagnosis not present

## 2017-11-20 DIAGNOSIS — M9903 Segmental and somatic dysfunction of lumbar region: Secondary | ICD-10-CM | POA: Diagnosis not present

## 2017-11-20 DIAGNOSIS — M9904 Segmental and somatic dysfunction of sacral region: Secondary | ICD-10-CM | POA: Diagnosis not present

## 2017-11-20 DIAGNOSIS — M9905 Segmental and somatic dysfunction of pelvic region: Secondary | ICD-10-CM | POA: Diagnosis not present

## 2017-11-20 MED ORDER — ALBUTEROL SULFATE HFA 108 (90 BASE) MCG/ACT IN AERS: 2.0000 | INHALATION_SPRAY | Freq: Four times a day (QID) | RESPIRATORY_TRACT | 5 refills | Status: DC | PRN

## 2017-11-20 MED ORDER — AZITHROMYCIN 250 MG PO TABS: ORAL_TABLET | ORAL | 0 refills | Status: DC

## 2017-11-20 MED ORDER — PREDNISONE 10 MG PO TABS: ORAL_TABLET | ORAL | 0 refills | Status: DC

## 2017-11-20 NOTE — Progress Notes (Signed)
@Patient  ID: [name], female    DOB: Mar 23, 1934, 84 y.o.   MRN: [id number]  Chief Complaint  Patient presents with  . Cough    with yellow/green congestion, had sore throat last week.    Referring provider: [name], DO  HPI   84 year old female with mild persistent asthma followed by Dr. Pennie Banter.  Health history includes HTN, allergic rhinitis, GERD, and Diabetes.  Tests:  March 2017 eosinophil count 100 cells per microliter, IgE 26 Jul 2015 pulmonary function testing ratio 86%, FEV1 2.10 L (113% predicted), FVC 2.45 (98% predicted), no change in FEV1 with bronchodilator, total lung capacity 4.57 L (93% predicted). DLCO 17.47 (76% predicted).  OV 11/20/17 - Acute visit Patient presents for sick visit. She complains of 1 week of cough, chest congestion, and sore throat. Cough has been productive of yellow sputum. Wheezing at times. States that symptoms have progressively worsened over the past week. Denies fever, or nausea. Has been compliant with Symbicort, Singulair and Flonase. She needs a refill on rescue inhaler. No recent flares or antibiotic use.    Allergies  Allergen Reactions  . Morphine Sulfate Other (See Comments)    Makes her hyper    Immunization History  Administered Date(s) Administered  . Influenza Split 01/03/2012  . Influenza Whole 12/19/2008, 12/23/2009, 11/13/2010  . Influenza, High Dose Seasonal PF 12/13/2013, 01/10/2015, 01/11/2016, 01/03/2017  . Influenza,inj,Quad PF,6+ Mos 11/19/2012  . Influenza,inj,quad, With Preservative 12/23/2016  . Influenza-Unspecified 01/10/2015, 01/03/2017  . Pneumococcal Conjugate-13 01/28/2013  . Pneumococcal Polysaccharide-23 12/24/2007  . Tdap 11/13/2010    Past Medical History:  Diagnosis Date  . ALLERGIC RHINITIS   . ANEMIA, IRON DEFICIENCY   . ANXIETY   . ARTHRITIS    s/p bilateral THR  . ASTHMA, EXTRINSIC   . Bronchitis   . Cataracts, both eyes   . Chronic constipation   . FIBROMYALGIA      fibromyalgia  . G E R D   . High cholesterol   . HYPERLIPIDEMIA   . HYPERTENSION   . Interstitial cystitis   . MIGRAINE HEADACHE     Tobacco History: Social History   Tobacco Use  Smoking Status Former Smoker  . Packs/day: 0.10  . Years: 10.00  . Pack years: 1.00  . Types: Cigarettes  . Last attempt to quit: 03/25/1968  . Years since quitting: 49.6  Smokeless Tobacco Never Used  Tobacco Comment   Widowed with two children. Retired Engineer, maintenance (IT) given: Yes Comment: Widowed with two children. Retired Web designer   Outpatient Encounter Medications as of 11/20/2017  Medication Sig  . acetaminophen (TYLENOL) 500 MG tablet Take by mouth as needed.  Marland Kitchen albuterol (PROAIR HFA) 108 (90 Base) MCG/ACT inhaler Inhale 2 puffs into the lungs every 6 (six) hours as needed.  Marland Kitchen amLODipine (NORVASC) 5 MG tablet Take 5 mg by mouth daily.  . busPIRone (BUSPAR) 15 MG tablet Take 1 tablet (15 mg total) by mouth 3 (three) times daily.  . cetirizine (ZYRTEC) 10 MG tablet Take 10 mg by mouth daily.   . chlorthalidone (HYGROTON) 25 MG tablet TAKE 1/2 TABLET BY MOUTH DAILY  . Cholecalciferol (VITAMIN D3) 5000 units TABS 5,000 IU OTC vitamin D3 daily.  Marland Kitchen docusate sodium (COLACE) 100 MG capsule Take 1 capsule (100 mg total) by mouth 2 (two) times daily.  . DULoxetine (CYMBALTA) 30 MG capsule Take 1 capsule (30 mg total) by mouth daily.  . ferrous sulfate (FERROUSUL) 325 (65 FE)  MG tablet Take 1 tablet (325 mg total) by mouth 3 (three) times daily with meals.  . fluticasone (FLONASE) 50 MCG/ACT nasal spray USE 2 SPRAYS INTO EACH NOSTRIL EVERY DAY  . losartan (COZAAR) 100 MG tablet Take 1 tablet (100 mg total) by mouth daily.  . methocarbamol (ROBAXIN) 500 MG tablet as needed.  . montelukast (SINGULAIR) 10 MG tablet Take 1 tablet (10 mg total) by mouth at bedtime.  Marland Kitchen omeprazole (PRILOSEC) 20 MG capsule Take 1 capsule (20 mg total) by mouth daily.  . SYMBICORT 80-4.5 MCG/ACT inhaler INHALE 2 PUFFS BY  MOUTH TWICE DAILY (Patient taking differently: as needed. )  . traMADol (ULTRAM) 50 MG tablet Take by mouth as needed.  . vitamin B-12 (CYANOCOBALAMIN) 1000 MCG tablet Take 1 tablet (1,000 mcg total) by mouth daily.  . [DISCONTINUED] albuterol (PROAIR HFA) 108 (90 Base) MCG/ACT inhaler Inhale 2 puffs into the lungs every 6 (six) hours as needed.  Marland Kitchen azithromycin (ZITHROMAX) 250 MG tablet Take 2 tablets (500 mg) PO on day 1, then take 1 tablet (250 mg) on days 2-5  . predniSONE (DELTASONE) 10 MG tablet Take 3 tabs for 2 days, then 2 tabs for 2 days, then 1 tab for 2 days, then stop   No facility-administered encounter medications on file as of 11/20/2017.      Review of Systems  Review of Systems  Constitutional: Negative.   HENT: Positive for postnasal drip and sore throat. Negative for ear pain, sinus pressure and sinus pain.   Respiratory: Positive for cough. Negative for shortness of breath.   Cardiovascular: Negative.   Gastrointestinal: Negative.   Allergic/Immunologic: Negative.   Neurological: Negative.   Psychiatric/Behavioral: Negative.        Physical Exam  BP 128/72 (BP Location: Left Arm, Patient Position: Sitting, Cuff Size: Normal)   Pulse 88   Temp 97.8 F (36.6 C) (Oral)   Ht 5\' 3"  (1.6 m)   Wt 143 lb 3.2 oz (65 kg)   SpO2 97%   BMI 25.37 kg/m   Wt Readings from Last 5 Encounters:  11/20/17 143 lb 3.2 oz (65 kg)  11/04/17 143 lb 6.4 oz (65 kg)  10/23/17 144 lb (65.3 kg)  10/01/17 141 lb (64 kg)  08/05/17 141 lb (64 kg)     Physical Exam  Constitutional: She is oriented to person, place, and time. She appears well-developed and well-nourished. No distress.  Cardiovascular: Normal rate and regular rhythm.  Pulmonary/Chest: Effort normal and breath sounds normal. No respiratory distress. She has no wheezes. She has no rales.  Neurological: She is alert and oriented to person, place, and time.  Psychiatric: She has a normal mood and affect.  Nursing note  and vitals reviewed.    Assessment & Plan:   Asthma with acute exacerbation Mild asthma flare due to URI  Patient Instructions  Will order Prednisone taper Will order zpack to hold incase symptoms worsen over the holiday weekend.  Continue Symbicort Will refill proventil Follow up with Dr. Lake Bells in 1 month    Allergic rhinitis Continue singuliar     Fenton Foy, NP 11/20/2017

## 2017-11-20 NOTE — Assessment & Plan Note (Addendum)
Mild asthma flare due to URI  Patient Instructions  Will order Prednisone taper Will order zpack to hold incase symptoms worsen over the holiday weekend.  Continue Symbicort Will refill proventil Follow up with Dr. Lake Bells in 1 month

## 2017-11-20 NOTE — Progress Notes (Signed)
Reviewed, agree 

## 2017-11-20 NOTE — Assessment & Plan Note (Signed)
Continue singuliar

## 2017-11-20 NOTE — Patient Instructions (Addendum)
Will order Prednisone taper Will order zpack to hold incase symptoms worsen over the holiday weekend.  Continue Symbicort Will refill proventil Follow up with Dr. Lake Bells in 1 month

## 2017-11-25 DIAGNOSIS — M5136 Other intervertebral disc degeneration, lumbar region: Secondary | ICD-10-CM | POA: Diagnosis not present

## 2017-11-25 DIAGNOSIS — M9903 Segmental and somatic dysfunction of lumbar region: Secondary | ICD-10-CM | POA: Diagnosis not present

## 2017-11-25 DIAGNOSIS — M9905 Segmental and somatic dysfunction of pelvic region: Secondary | ICD-10-CM | POA: Diagnosis not present

## 2017-11-25 DIAGNOSIS — M9904 Segmental and somatic dysfunction of sacral region: Secondary | ICD-10-CM | POA: Diagnosis not present

## 2017-11-27 DIAGNOSIS — M9903 Segmental and somatic dysfunction of lumbar region: Secondary | ICD-10-CM | POA: Diagnosis not present

## 2017-11-27 DIAGNOSIS — M5136 Other intervertebral disc degeneration, lumbar region: Secondary | ICD-10-CM | POA: Diagnosis not present

## 2017-11-27 DIAGNOSIS — M9905 Segmental and somatic dysfunction of pelvic region: Secondary | ICD-10-CM | POA: Diagnosis not present

## 2017-11-27 DIAGNOSIS — M9904 Segmental and somatic dysfunction of sacral region: Secondary | ICD-10-CM | POA: Diagnosis not present

## 2017-12-02 DIAGNOSIS — H04123 Dry eye syndrome of bilateral lacrimal glands: Secondary | ICD-10-CM | POA: Diagnosis not present

## 2017-12-02 DIAGNOSIS — H35372 Puckering of macula, left eye: Secondary | ICD-10-CM | POA: Diagnosis not present

## 2017-12-02 DIAGNOSIS — Z961 Presence of intraocular lens: Secondary | ICD-10-CM | POA: Diagnosis not present

## 2017-12-02 DIAGNOSIS — H40013 Open angle with borderline findings, low risk, bilateral: Secondary | ICD-10-CM | POA: Diagnosis not present

## 2017-12-05 ENCOUNTER — Other Ambulatory Visit: Payer: Self-pay | Admitting: Cardiovascular Disease

## 2017-12-05 ENCOUNTER — Telehealth: Payer: Self-pay | Admitting: Pulmonary Disease

## 2017-12-05 MED ORDER — LEVOFLOXACIN 500 MG PO TABS: 500.0000 mg | ORAL_TABLET | Freq: Every day | ORAL | 0 refills | Status: DC

## 2017-12-05 MED ORDER — PREDNISONE 10 MG PO TABS: ORAL_TABLET | ORAL | 0 refills | Status: DC

## 2017-12-05 NOTE — Telephone Encounter (Signed)
Levaquin 500mg  po daily x 5 days Prednisone: Take 40mg  po daily for 3 days, then take 30mg  po daily for 3 days, then take 20mg  po daily for two days, then take 10mg  po daily for 2 days Call if no improvement

## 2017-12-05 NOTE — Telephone Encounter (Signed)
Spoke with patient. She stated that she was seen by Tonya on 11/20/17. She was prescribed a zpak and prednisone taper during this visit for bronchitis. She has finished both medications and still does not feel like herself. She has a non productive cough, runny nose and increased fatigue. She has been using her albuterol inhaler with no relief.   She wishes to have another round of antibiotics and prednisone to be called in for her.   Pharmacy is [facility].   BQ, please advise. Thanks!

## 2017-12-05 NOTE — Telephone Encounter (Signed)
Called pt and advised message from the provider. Pt understood and verbalized understanding. Nothing further is needed.   Rx's called in.

## 2017-12-09 DIAGNOSIS — M9905 Segmental and somatic dysfunction of pelvic region: Secondary | ICD-10-CM | POA: Diagnosis not present

## 2017-12-09 DIAGNOSIS — M5136 Other intervertebral disc degeneration, lumbar region: Secondary | ICD-10-CM | POA: Diagnosis not present

## 2017-12-09 DIAGNOSIS — M9903 Segmental and somatic dysfunction of lumbar region: Secondary | ICD-10-CM | POA: Diagnosis not present

## 2017-12-09 DIAGNOSIS — M9904 Segmental and somatic dysfunction of sacral region: Secondary | ICD-10-CM | POA: Diagnosis not present

## 2017-12-15 DIAGNOSIS — M9905 Segmental and somatic dysfunction of pelvic region: Secondary | ICD-10-CM | POA: Diagnosis not present

## 2017-12-15 DIAGNOSIS — M9904 Segmental and somatic dysfunction of sacral region: Secondary | ICD-10-CM | POA: Diagnosis not present

## 2017-12-15 DIAGNOSIS — M9903 Segmental and somatic dysfunction of lumbar region: Secondary | ICD-10-CM | POA: Diagnosis not present

## 2017-12-15 DIAGNOSIS — M5136 Other intervertebral disc degeneration, lumbar region: Secondary | ICD-10-CM | POA: Diagnosis not present

## 2017-12-18 DIAGNOSIS — M5136 Other intervertebral disc degeneration, lumbar region: Secondary | ICD-10-CM | POA: Diagnosis not present

## 2017-12-18 DIAGNOSIS — M9905 Segmental and somatic dysfunction of pelvic region: Secondary | ICD-10-CM | POA: Diagnosis not present

## 2017-12-18 DIAGNOSIS — M9904 Segmental and somatic dysfunction of sacral region: Secondary | ICD-10-CM | POA: Diagnosis not present

## 2017-12-18 DIAGNOSIS — M9903 Segmental and somatic dysfunction of lumbar region: Secondary | ICD-10-CM | POA: Diagnosis not present

## 2017-12-19 ENCOUNTER — Ambulatory Visit (INDEPENDENT_AMBULATORY_CARE_PROVIDER_SITE_OTHER): Payer: Medicare Other | Admitting: Pulmonary Disease

## 2017-12-19 ENCOUNTER — Encounter: Payer: Self-pay | Admitting: Pulmonary Disease

## 2017-12-19 ENCOUNTER — Ambulatory Visit (INDEPENDENT_AMBULATORY_CARE_PROVIDER_SITE_OTHER)
Admission: RE | Admit: 2017-12-19 | Discharge: 2017-12-19 | Disposition: A | Discharge status: Home / Self Care | Payer: Medicare Other | Source: Ambulatory Visit | Attending: Pulmonary Disease | Admitting: Pulmonary Disease

## 2017-12-19 VITALS — BP 114/72 | HR 83 | Ht 62.0 in | Wt 144.0 lb

## 2017-12-19 DIAGNOSIS — Z23 Encounter for immunization: Secondary | ICD-10-CM | POA: Diagnosis not present

## 2017-12-19 DIAGNOSIS — R05 Cough: Secondary | ICD-10-CM | POA: Diagnosis not present

## 2017-12-19 DIAGNOSIS — J4521 Mild intermittent asthma with (acute) exacerbation: Secondary | ICD-10-CM | POA: Diagnosis not present

## 2017-12-19 DIAGNOSIS — R0602 Shortness of breath: Secondary | ICD-10-CM | POA: Diagnosis not present

## 2017-12-19 DIAGNOSIS — J3089 Other allergic rhinitis: Secondary | ICD-10-CM

## 2017-12-19 LAB — NITRIC OXIDE: Nitric Oxide: 15

## 2017-12-19 MED ORDER — PREDNISONE 10 MG PO TABS: ORAL_TABLET | ORAL | 0 refills | Status: DC

## 2017-12-19 NOTE — Patient Instructions (Signed)
Allergic rhinitis: Continue montelukast daily Continue cetirizine daily Continue Flonase daily Start using NeilMed rinses twice a day  Severe persistent asthma with recent exacerbation and recurrent exacerbation today: Continue Symbicort 2 puffs twice a day no matter how you feel Use albuterol as needed for chest tightness wheezing or shortness of breath Take another prednisone taper, I will call this in today  Follow-up with me in 6 weeks or sooner if need

## 2017-12-19 NOTE — Progress Notes (Signed)
Subjective:    Patient ID: [name], female    DOB: 03-02-1934, 84 y.o.   MRN: [id number]   Synopsis: Former patient of Dr. Joya Gaskins who has moderate persistent asthma.   HPI Chief Complaint  Patient presents with  . Follow-up    Had asthma attack Labor Day weekend.  finished meds last week but still coughing and perfuse sweating with activity.  Prod cough at times - white.     [name] says that she is still coughing  > no chest pain" > coughing is dry > hasn't been producing much mucus > No fevers or chills  She says that when she was taking the prednisone her sinuses cleared up but they "filled up" right away after coming off the prednisone.  She is taking cetirizine, montelukast and she has been using Flonase somewhat sporadically for the last several weeks.  She has not been using any saline rinses.  Prior to this episode she says she was doing quite well.  She was not using Symbicort.  She is currently taking Symbicort again.  Past Medical History:  Diagnosis Date  . ALLERGIC RHINITIS   . ANEMIA, IRON DEFICIENCY   . ANXIETY   . ARTHRITIS    s/p bilateral THR  . ASTHMA, EXTRINSIC   . Bronchitis   . Cataracts, both eyes   . Chronic constipation   . FIBROMYALGIA    fibromyalgia  . G E R D   . High cholesterol   . HYPERLIPIDEMIA   . HYPERTENSION   . Interstitial cystitis   . MIGRAINE HEADACHE       Review of Systems  Constitutional: Negative for chills, diaphoresis, fatigue and unexpected weight change.  HENT: Negative for rhinorrhea, sinus pressure and sneezing.   Respiratory: Negative for choking, shortness of breath and wheezing.   Cardiovascular: Negative for chest pain, palpitations and leg swelling.       Objective:   Physical Exam Vitals:   12/19/17 1016  BP: 114/72  Pulse: 83  SpO2: 96%  Weight: 144 lb (65.3 kg)  Height: 5\' 2"  (1.575 m)    Gen: well appearing HENT: OP clear, TM's clear, neck supple PULM: CTA B, normal  percussion CV: RRR, no mgr, trace edema GI: BS+, soft, nontender Derm: no cyanosis or rash Psyche: normal mood and affect    Labs: March 2017 eosinophil count 100 cells per microliter, IgE 3  PFT May 2017 pulmonary function testing ratio 86%, FEV1 2.10 L (113% predicted), FVC 2.45 (98% predicted), no change in FEV1 with bronchodilator, total lung capacity 4.57 L (93% predicted). DLCO 17.47 (76% predicted). December 19, 2017 spirometry ratio 49%, FEV1 1.1 L 66% predicted, good start, good effort acceptable by ATS criteria  FENO December 19, 2017 exhaled nitric oxide 15 ppm     Assessment & Plan:   Mild intermittent asthma with acute exacerbation - Plan: Spirometry with Graph, DG Chest 2 View, Nitric oxide, CANCELED: Nitric oxide  Allergic rhinitis due to other allergic trigger, unspecified seasonality  Discussion: In the past I have been more suspicious of allergic rhinitis being the predominant cause of Ms. Veneziano symptoms but today her spirometry test shows clear airflow obstruction so I believe that her asthma is flaring up again.  She needs another round of prednisone and she needs to continue on high-dose Symbicort as she is doing for now.  There have been no recent environmental changes.  This all being said, her allergic rhinitis symptoms are contributing to her overall  symptom burden so we need to work on that as well.  Plan: Allergic rhinitis: Continue montelukast daily Continue cetirizine daily Continue Flonase daily Start using NeilMed rinses twice a day  Severe persistent asthma with recent exacerbation and recurrent exacerbation today: Continue Symbicort 2 puffs twice a day no matter how you feel Use albuterol as needed for chest tightness wheezing or shortness of breath Take another prednisone taper, I will call this in today  Follow-up with me in 6 weeks or sooner if need  Greater than 50% of this visit was spent face-to-face, 30 minutes  total   Current Outpatient Medications:  .  acetaminophen (TYLENOL) 500 MG tablet, Take by mouth as needed., Disp: , Rfl:  .  albuterol (PROAIR HFA) 108 (90 Base) MCG/ACT inhaler, Inhale 2 puffs into the lungs every 6 (six) hours as needed., Disp: 1 Inhaler, Rfl: 5 .  amLODipine (NORVASC) 10 MG tablet, TAKE 1 TABLET BY MOUTH DAILY (Patient taking differently: Take 10 mg by mouth daily. ), Disp: 90 tablet, Rfl: 3 .  busPIRone (BUSPAR) 15 MG tablet, Take 1 tablet (15 mg total) by mouth 3 (three) times daily., Disp: 90 tablet, Rfl: 5 .  cetirizine (ZYRTEC) 10 MG tablet, Take 10 mg by mouth daily. , Disp: , Rfl:  .  chlorthalidone (HYGROTON) 25 MG tablet, TAKE 1/2 TABLET BY MOUTH DAILY, Disp: 15 tablet, Rfl: 0 .  Cholecalciferol (VITAMIN D3) 5000 units TABS, 5,000 IU OTC vitamin D3 daily., Disp: 90 tablet, Rfl: 3 .  DULoxetine (CYMBALTA) 30 MG capsule, Take 1 capsule (30 mg total) by mouth daily., Disp: 90 capsule, Rfl: 1 .  ferrous sulfate (FERROUSUL) 325 (65 FE) MG tablet, Take 1 tablet (325 mg total) by mouth 3 (three) times daily with meals., Disp: , Rfl:  .  fluticasone (FLONASE) 50 MCG/ACT nasal spray, USE 2 SPRAYS INTO EACH NOSTRIL EVERY DAY, Disp: 48 g, Rfl: 3 .  losartan (COZAAR) 100 MG tablet, Take 1 tablet (100 mg total) by mouth daily., Disp: 90 tablet, Rfl: 1 .  methocarbamol (ROBAXIN) 500 MG tablet, as needed., Disp: , Rfl:  .  montelukast (SINGULAIR) 10 MG tablet, Take 1 tablet (10 mg total) by mouth at bedtime., Disp: 90 tablet, Rfl: 3 .  omeprazole (PRILOSEC) 20 MG capsule, Take 1 capsule (20 mg total) by mouth daily., Disp: 90 capsule, Rfl: 1 .  SYMBICORT 80-4.5 MCG/ACT inhaler, INHALE 2 PUFFS BY MOUTH TWICE DAILY (Patient taking differently: as needed. ), Disp: 10.2 g, Rfl: 5 .  traMADol (ULTRAM) 50 MG tablet, Take by mouth as needed., Disp: , Rfl:  .  vitamin B-12 (CYANOCOBALAMIN) 1000 MCG tablet, Take 1 tablet (1,000 mcg total) by mouth daily., Disp: 90 tablet, Rfl: 3 .   amLODipine (NORVASC) 5 MG tablet, Take 5 mg by mouth daily., Disp: , Rfl:  .  azithromycin (ZITHROMAX) 250 MG tablet, Take 2 tablets (500 mg) PO on day 1, then take 1 tablet (250 mg) on days 2-5 (Patient not taking: Reported on 12/19/2017), Disp: 6 tablet, Rfl: 0 .  docusate sodium (COLACE) 100 MG capsule, Take 1 capsule (100 mg total) by mouth 2 (two) times daily. (Patient not taking: Reported on 12/19/2017), Disp: 10 capsule, Rfl: 0 .  levofloxacin (LEVAQUIN) 500 MG tablet, Take 1 tablet (500 mg total) by mouth daily. (Patient not taking: Reported on 12/19/2017), Disp: 5 tablet, Rfl: 0 .  predniSONE (DELTASONE) 10 MG tablet, Take 3 tabs for 2 days, then 2 tabs for 2 days, then 1 tab  for 2 days, then stop (Patient not taking: Reported on 12/19/2017), Disp: 12 tablet, Rfl: 0 .  predniSONE (DELTASONE) 10 MG tablet, Take 4 tabs by mouth for 3 days, then 3 for 3 days, 2 for 3 days, 1 for 3 days and stop (Patient not taking: Reported on 12/19/2017), Disp: 30 tablet, Rfl: 0

## 2017-12-29 ENCOUNTER — Ambulatory Visit (INDEPENDENT_AMBULATORY_CARE_PROVIDER_SITE_OTHER): Payer: Medicare Other | Admitting: Adult Health

## 2017-12-29 ENCOUNTER — Encounter: Payer: Self-pay | Admitting: Adult Health

## 2017-12-29 ENCOUNTER — Other Ambulatory Visit: Payer: Self-pay | Admitting: Cardiovascular Disease

## 2017-12-29 VITALS — BP 127/67 | HR 114 | Ht 62.0 in | Wt 141.2 lb

## 2017-12-29 DIAGNOSIS — B029 Zoster without complications: Secondary | ICD-10-CM

## 2017-12-29 MED ORDER — VALACYCLOVIR HCL 1 G PO TABS: 1000.0000 mg | ORAL_TABLET | Freq: Three times a day (TID) | ORAL | 0 refills | Status: DC

## 2017-12-29 NOTE — Assessment & Plan Note (Signed)
Please take Valacyclovir (Valtrex) 1000mg  three times daily, take for 7 days. Do not participate in water aerobics for at 2 weeks and/or until all lesions are crusted over. Do not recommend elective injection therapy with Dr. Nelva Bush for at least two weeks, but we recommend you call his office to confirm. Please follow-up in two weeks here. Please call office with any questions/concerns.

## 2017-12-29 NOTE — Patient Instructions (Signed)
Shingles Shingles, which is also known as herpes zoster, is an infection that causes a painful skin rash and fluid-filled blisters. Shingles is not related to genital herpes, which is a sexually transmitted infection. Shingles only develops in people who:  Have had chickenpox.  Have received the chickenpox vaccine. (This is rare.)  What are the causes? Shingles is caused by varicella-zoster virus (VZV). This is the same virus that causes chickenpox. After exposure to VZV, the virus stays in the body in an inactive (dormant) state. Shingles develops if the virus reactivates. This can happen many years after the initial exposure to VZV. It is not known what causes this virus to reactivate. What increases the risk? People who have had chickenpox or received the chickenpox vaccine are at risk for shingles. Infection is more common in people who:  Are older than age 50.  Have a weakened defense (immune) system, such as those with HIV, AIDS, or cancer.  Are taking medicines that weaken the immune system, such as transplant medicines.  Are under great stress.  What are the signs or symptoms? Early symptoms of this condition include itching, tingling, and pain in an area on your skin. Pain may be described as burning, stabbing, or throbbing. A few days or weeks after symptoms start, a painful red rash appears, usually on one side of the body in a bandlike or beltlike pattern. The rash eventually turns into fluid-filled blisters that break open, scab over, and dry up in about 2-3 weeks. At any time during the infection, you may also develop:  A fever.  Chills.  A headache.  An upset stomach.  How is this diagnosed? This condition is diagnosed with a skin exam. Sometimes, skin or fluid samples are taken from the blisters before a diagnosis is made. These samples are examined under a microscope or sent to a lab for testing. How is this treated? There is no specific cure for this condition.  Your health care provider will probably prescribe medicines to help you manage pain, recover more quickly, and avoid long-term problems. Medicines may include:  Antiviral drugs.  Anti-inflammatory drugs.  Pain medicines.  If the area involved is on your face, you may be referred to a specialist, such as an eye doctor (ophthalmologist) or an ear, nose, and throat (ENT) doctor to help you avoid eye problems, chronic pain, or disability. Follow these instructions at home: Medicines  Take medicines only as directed by your health care provider.  Apply an anti-itch or numbing cream to the affected area as directed by your health care provider. Blister and Rash Care  Take a cool bath or apply cool compresses to the area of the rash or blisters as directed by your health care provider. This may help with pain and itching.  Keep your rash covered with a loose bandage (dressing). Wear loose-fitting clothing to help ease the pain of material rubbing against the rash.  Keep your rash and blisters clean with mild soap and cool water or as directed by your health care provider.  Check your rash every day for signs of infection. These include redness, swelling, and pain that lasts or increases.  Do not pick your blisters.  Do not scratch your rash. General instructions  Rest as directed by your health care provider.  Keep all follow-up visits as directed by your health care provider. This is important.  Until your blisters scab over, your infection can cause chickenpox in people who have never had it or been vaccinated   against it. To prevent this from happening, avoid contact with other people, especially: ? Babies. ? Pregnant women. ? Children who have eczema. ? Elderly people who have transplants. ? People who have chronic illnesses, such as leukemia or AIDS. Contact a health care provider if:  Your pain is not relieved with prescribed medicines.  Your pain does not get better after  the rash heals.  Your rash looks infected. Signs of infection include redness, swelling, and pain that lasts or increases. Get help right away if:  The rash is on your face or nose.  You have facial pain, pain around your eye area, or loss of feeling on one side of your face.  You have ear pain or you have ringing in your ear.  You have loss of taste.  Your condition gets worse. This information is not intended to replace advice given to you by your health care provider. Make sure you discuss any questions you have with your health care provider. Document Released: 03/11/2005 Document Revised: 11/05/2015 Document Reviewed: 01/20/2014 Elsevier Interactive Patient Education  2018 [employer].  Please take Valacyclovir (Valtrex) 1000mg  three times daily, take for 7 days. Do not participate in water aerobics for at 2 weeks and/or until all lesions are crusted over. Do not recommend elective injection therapy with Dr. Nelva Bush for at least two weeks, but we recommend you call his office to confirm. Please follow-up in two weeks here. Please call office with any questions/concerns. FEEL BETTER!

## 2017-12-29 NOTE — Progress Notes (Signed)
Subjective:    Patient ID: [name], female    DOB: Jul 27, 1933, 84 y.o.   MRN: [id number]  HPI:  Ms. [name] presents with rash that developed on back 7 days ago. Rash is a constant pain, reported a "burning and itching", rated 5/10. She reports taking OTC Acetaminophen with good sx control. She has had shingles "5 times before and this feels exactly the same". She recently completed course of ABX and prednisone for acute asthma exacerbation. She denies fever/night sweats/chills/N/V/D/CP/palpitations. She has elective spinal injection with Dr. Nelva Bush scheduled for tomorrow, advised to re-schedule in 2 weeks. Also advised not to participate in water aerobics for two weeks. Last CMP 09/2017- LFT/Renal Fx- both normal  Patient Care Team    Relationship Specialty Notifications Start End  [name], DO PCP - General Family Medicine  08/30/15   Ladene Artist, MD Referring Physician Gastroenterology  07/02/10   Myrlene Broker, MD Referring Physician Urology  07/02/10   Clent Jacks, MD Referring Physician Ophthalmology  07/02/10   [name], MD Referring Physician Cardiology  07/02/10   Danella Sensing, MD Consulting Physician Dermatology  11/09/15   Juanito Doom, MD Consulting Physician Pulmonary Disease  11/09/15   Suella Broad, MD Consulting Physician Physical Medicine and Rehabilitation  11/09/15    Comment: pain mgt :  injections and pain meds  Rockne Menghini, RPH-CPP Pharmacist Cardiology  11/19/15    Comment:  " [facility]" - med mgt of her HTN per request of Dr Alvester Chou- Cards  Alda Berthold, DO Consulting Physician Neurology  12/17/15   Harrington Challenger, [facility]  Pharmacist  07/29/16    Comment: clinical pharm-D in HTN clinic  Paralee Cancel, MD Consulting Physician Orthopedic Surgery  10/23/17    Comment: R hip replacement  Bo Merino, MD Consulting Physician Rheumatology  10/23/17     Patient Active Problem List   Diagnosis Date Noted  . Primary  osteoarthritis of both hands 10/21/2017  . DDD (degenerative disc disease), lumbar 10/21/2017  . History of COPD 10/01/2017  . Former smoker 10/01/2017  . Pain in joint of right shoulder 06/25/2017  . Trochanteric bursitis of right hip 06/25/2017  . Primary [name] disease without gangrene-  hands and feet 06/02/2017  . Iron deficiency anemia, unspecified 04/23/2017  . GAD (generalized anxiety disorder) 04/23/2017  . Insomnia 04/23/2017  . S/P right TH revision 09/24/2016  . Type 2 diabetes mellitus without complication, without long-term current use of insulin ([place]) 01/28/2016  . Medication refused- (any cholesterol ones) 01/20/2016  . Environmental and seasonal allergies 01/20/2016  . Chronic fatigue syndrome with fibromyalgia 01/20/2016  . Vitamin D deficiency 01/19/2016  . Prediabetes- 6.0 12/17 01/11/2016  . Adjustment disorder with mixed anxiety and depressed mood 01/11/2016  . Medication intolerance- statins- (make her FM much W) 01/10/2016  . Depression 11/24/2015  . Reaction, situational, acute, to stress(husband dying) 09/21/2015  . B12 deficiency 08/30/2015  . Dysuria 08/18/2015  . Chronic Fatigue- 2ndary to FM and Mood d/o  06/20/2015  . Memory loss   . Lumbar radiculopathy, chronic   . Herpes simplex labialis 04/03/2012  . Shingles 04/12/2011  . LBBB (left bundle branch block) 11/13/2010  . MIGRAINE HEADACHE 05/30/2010  . Iron deficiency anemia 11/03/2009  . Asthma, mild persistent 12/19/2008  . Hyperlipidemia 08/12/2007  . Anxiety state 08/12/2007  . Essential hypertension 08/12/2007  . Allergic rhinitis 08/12/2007  . G E R D 08/12/2007  . ARTHRITIS 08/12/2007  . Muscle  pain, fibromyalgia 08/12/2007     Past Medical History:  Diagnosis Date  . ALLERGIC RHINITIS   . ANEMIA, IRON DEFICIENCY   . ANXIETY   . ARTHRITIS    s/p bilateral THR  . ASTHMA, EXTRINSIC   . Bronchitis   . Cataracts, both eyes   . Chronic constipation   . FIBROMYALGIA     fibromyalgia  . G E R D   . High cholesterol   . HYPERLIPIDEMIA   . HYPERTENSION   . Interstitial cystitis   . MIGRAINE HEADACHE      Past Surgical History:  Procedure Laterality Date  . ABDOMINAL HYSTERECTOMY    . ANTERIOR HIP REVISION Right 09/24/2016   Procedure: RIGHT HIP ACETABULUM AND FEMORAL HEAD REVISION WITH BONE GRAFT;  Surgeon: Paralee Cancel, MD;  Location: [facility];  Service: Orthopedics;  Laterality: Right;  . APPENDECTOMY    . BACK SURGERY    . CATARACT EXTRACTION    . COLONOSCOPY    . DOPPLER ECHOCARDIOGRAPHY    . ESOPHAGOGASTRODUODENOSCOPY ENDOSCOPY    . JOINT REPLACEMENT    . LUMBAR DISC SURGERY    . NM MYOVIEW LTD     stress test  . Ovarian cyst removed    . ROTATOR CUFF REPAIR Left 5-6 years ago   Dr Onnie Graham; done at [facility] surgery center   . TOTAL HIP ARTHROPLASTY    . TOTAL SHOULDER ARTHROPLASTY       Family History  Problem Relation Age of Onset  . Arthritis Mother   . Hypertension Mother   . Heart disease Father   . Arthritis Father   . Hypertension Father   . Depression Sister   . Hyperlipidemia Sister   . Hypertension Other   . Hyperlipidemia Other      Social History   Substance and Sexual Activity  Drug Use No     Social History   Substance and Sexual Activity  Alcohol Use No  . Alcohol/week: 0.0 standard drinks     Social History   Tobacco Use  Smoking Status Former Smoker  . Packs/day: 0.10  . Years: 10.00  . Pack years: 1.00  . Types: Cigarettes  . Last attempt to quit: 03/25/1968  . 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 acetaminophen (TYLENOL) 500 MG tablet Take by mouth as needed.  Marland Kitchen albuterol (PROAIR HFA) 108 (90 Base) MCG/ACT inhaler Inhale 2 puffs into the lungs every 6 (six) hours as needed.  Marland Kitchen amLODipine (NORVASC) 10 MG tablet TAKE 1 TABLET BY MOUTH DAILY  (Patient taking differently: Take 10 mg by mouth daily. )  . amLODipine (NORVASC) 5 MG tablet Take 5 mg by mouth daily.  . busPIRone (BUSPAR) 15 MG tablet Take 1 tablet (15 mg total) by mouth 3 (three) times daily.  . cetirizine (ZYRTEC) 10 MG tablet Take 10 mg by mouth daily.   . chlorthalidone (HYGROTON) 25 MG tablet TAKE 1/2 TABLET BY MOUTH DAILY  . chlorthalidone (HYGROTON) 25 MG tablet TAKE 1/2 TABLET BY MOUTH DAILY  . Cholecalciferol (VITAMIN D3) 5000 units TABS 5,000 IU OTC vitamin D3 daily.  Marland Kitchen docusate sodium (COLACE) 100 MG capsule Take 1 capsule (100 mg total) by mouth 2 (two) times daily.  . DULoxetine (CYMBALTA) 30 MG capsule Take 1 capsule (30 mg total) by mouth daily.  . ferrous sulfate (  FERROUSUL) 325 (65 FE) MG tablet Take 1 tablet (325 mg total) by mouth 3 (three) times daily with meals.  . fluticasone (FLONASE) 50 MCG/ACT nasal spray USE 2 SPRAYS INTO EACH NOSTRIL EVERY DAY  . levofloxacin (LEVAQUIN) 500 MG tablet Take 1 tablet (500 mg total) by mouth daily.  Marland Kitchen losartan (COZAAR) 100 MG tablet Take 1 tablet (100 mg total) by mouth daily.  . methocarbamol (ROBAXIN) 500 MG tablet as needed.  . montelukast (SINGULAIR) 10 MG tablet Take 1 tablet (10 mg total) by mouth at bedtime.  Marland Kitchen omeprazole (PRILOSEC) 20 MG capsule Take 1 capsule (20 mg total) by mouth daily.  . SYMBICORT 80-4.5 MCG/ACT inhaler INHALE 2 PUFFS BY MOUTH TWICE DAILY (Patient taking differently: as needed. )  . traMADol (ULTRAM) 50 MG tablet Take by mouth as needed.  . vitamin B-12 (CYANOCOBALAMIN) 1000 MCG tablet Take 1 tablet (1,000 mcg total) by mouth daily.  . valACYclovir (VALTREX) 1000 MG tablet Take 1 tablet (1,000 mg total) by mouth 3 (three) times daily.  . [DISCONTINUED] azithromycin (ZITHROMAX) 250 MG tablet Take 2 tablets (500 mg) PO on day 1, then take 1 tablet (250 mg) on days 2-5 (Patient not taking: Reported on 12/19/2017)  . [DISCONTINUED] predniSONE (DELTASONE) 10 MG tablet Take 3 tabs for 2 days,  then 2 tabs for 2 days, then 1 tab for 2 days, then stop (Patient not taking: Reported on 12/19/2017)  . [DISCONTINUED] predniSONE (DELTASONE) 10 MG tablet Take 4 tabs by mouth for 3 days, then 3 for 3 days, 2 for 3 days, 1 for 3 days and stop (Patient not taking: Reported on 12/19/2017)  . [DISCONTINUED] predniSONE (DELTASONE) 10 MG tablet Take 40mg  po daily for 3 days, then take 30mg  po daily for 3 days, then take 20mg  po daily for two days, then take 10mg  po daily for 2 days   No facility-administered encounter medications on file as of 12/29/2017.     Allergies: Morphine sulfate  Body mass index is 25.83 kg/m.  Blood pressure 127/67, pulse (!) 114, height 5\' 2"  (1.575 m), weight 141 lb 3.2 oz (64 kg), SpO2 97 %.  Review of Systems  Constitutional: Positive for fatigue. Negative for activity change, appetite change, chills, diaphoresis, fever and unexpected weight change.  HENT: Negative for congestion.   Eyes: Negative for visual disturbance.  Respiratory: Negative for cough, chest tightness, shortness of breath, wheezing and stridor.   Cardiovascular: Negative for chest pain, palpitations and leg swelling.  Gastrointestinal: Negative for abdominal distention, abdominal pain, blood in stool, constipation, diarrhea, nausea and vomiting.  Musculoskeletal: Positive for arthralgias, back pain, gait problem and myalgias.  Skin: Positive for color change and rash. Negative for pallor and wound.  Neurological: Negative for dizziness and headaches.  Hematological: Does not bruise/bleed easily.  Psychiatric/Behavioral: Positive for sleep disturbance.       Objective:   Physical Exam  Constitutional: She appears well-developed and well-nourished. No distress.  HENT:  Head: Normocephalic and atraumatic.  Right Ear: External ear normal.  Left Ear: External ear normal.  Nose: Nose normal.  Mouth/Throat: Oropharynx is clear and moist.  Eyes: Pupils are equal, round, and reactive to light.  Conjunctivae and EOM are normal.  Cardiovascular: Normal rate, regular rhythm, normal heart sounds and intact distal pulses.  No murmur heard. Pulmonary/Chest: Effort normal and breath sounds normal. No stridor. No respiratory distress. She has no wheezes. She has no rales. She exhibits no tenderness.  Skin: Skin is warm and dry. Capillary refill  takes less than 2 seconds. Rash noted. Rash is maculopapular. Rash is not vesicular. She is not diaphoretic. There is erythema. There is pallor.     Psychiatric: She has a normal mood and affect. Her behavior is normal. Judgment and thought content normal.  Nursing note and vitals reviewed.     Assessment & Plan:   1. Herpes zoster without complication     Shingles Please take Valacyclovir (Valtrex) 1000mg  three times daily, take for 7 days. Do not participate in water aerobics for at 2 weeks and/or until all lesions are crusted over. Do not recommend elective injection therapy with Dr. Nelva Bush for at least two weeks, but we recommend you call his office to confirm. Please follow-up in two weeks here. Please call office with any questions/concerns.    FOLLOW-UP:  Return in about 2 weeks (around 01/12/2018) for Regular Follow Up, Shingles.

## 2017-12-30 DIAGNOSIS — M9903 Segmental and somatic dysfunction of lumbar region: Secondary | ICD-10-CM | POA: Diagnosis not present

## 2017-12-30 DIAGNOSIS — M9904 Segmental and somatic dysfunction of sacral region: Secondary | ICD-10-CM | POA: Diagnosis not present

## 2017-12-30 DIAGNOSIS — M9905 Segmental and somatic dysfunction of pelvic region: Secondary | ICD-10-CM | POA: Diagnosis not present

## 2017-12-30 DIAGNOSIS — M5136 Other intervertebral disc degeneration, lumbar region: Secondary | ICD-10-CM | POA: Diagnosis not present

## 2018-01-07 NOTE — Progress Notes (Signed)
Subjective:    Patient ID: [name], female    DOB: 12/11/1933, 84 y.o.   MRN: [id number]  HPI: 12/29/17 OV:  Ms. [name] presents with rash that developed on back 7 days ago. Rash is a constant pain, reported a "burning and itching", rated 5/10. She reports taking OTC Acetaminophen with good sx control. She has had shingles "5 times before and this feels exactly the same". She recently completed course of ABX and prednisone for acute asthma exacerbation. She denies fever/night sweats/chills/N/V/D/CP/palpitations. She has elective spinal injection with Dr. Nelva Bush scheduled for tomorrow, advised to re-schedule in 2 weeks. Also advised not to participate in water aerobics for two weeks. Last CMP 09/2017- LFT/Renal Fx- both normal  01/12/18 OV: Ms. [name] presents for f/u: shingles She completed course of anti-viral and denies acute pain. She reports intermittent itching, that she has been treating with OTC Aloe. She denies fever/chills/N/V/D/HA/CP/dyspnea/dizziness/palpitations  Patient Care Team    Relationship Specialty Notifications Start End  [name], DO PCP - General Family Medicine  08/30/15   Ladene Artist, MD Referring Physician Gastroenterology  07/02/10   Myrlene Broker, MD Referring Physician Urology  07/02/10   Clent Jacks, MD Referring Physician Ophthalmology  07/02/10   [name], MD Referring Physician Cardiology  07/02/10   Danella Sensing, MD Consulting Physician Dermatology  11/09/15   Juanito Doom, MD Consulting Physician Pulmonary Disease  11/09/15   Suella Broad, MD Consulting Physician Physical Medicine and Rehabilitation  11/09/15    Comment: pain mgt :  injections and pain meds  Rockne Menghini, RPH-CPP Pharmacist Cardiology  11/19/15    Comment:  " [facility]" - med mgt of her HTN per request of Dr Alvester Chou- Cards  Alda Berthold, DO Consulting Physician Neurology  12/17/15   Harrington Challenger, [facility]  Pharmacist  07/29/16    Comment: clinical pharm-D in HTN clinic  Paralee Cancel, MD Consulting Physician Orthopedic Surgery  10/23/17    Comment: R hip replacement  Bo Merino, MD Consulting Physician Rheumatology  10/23/17     Patient Active Problem List   Diagnosis Date Noted  . Primary osteoarthritis of both hands 10/21/2017  . DDD (degenerative disc disease), lumbar 10/21/2017  . History of COPD 10/01/2017  . Former smoker 10/01/2017  . Pain in joint of right shoulder 06/25/2017  . Trochanteric bursitis of right hip 06/25/2017  . Primary [name] disease without gangrene-  hands and feet 06/02/2017  . Iron deficiency anemia, unspecified 04/23/2017  . GAD (generalized anxiety disorder) 04/23/2017  . Insomnia 04/23/2017  . S/P right TH revision 09/24/2016  . Type 2 diabetes mellitus without complication, without long-term current use of insulin ([place]) 01/28/2016  . Medication refused- (any cholesterol ones) 01/20/2016  . Environmental and seasonal allergies 01/20/2016  . Chronic fatigue syndrome with fibromyalgia 01/20/2016  . Vitamin D deficiency 01/19/2016  . Prediabetes- 6.0 12/17 01/11/2016  . Adjustment disorder with mixed anxiety and depressed mood 01/11/2016  . Medication intolerance- statins- (make her FM much W) 01/10/2016  . Depression 11/24/2015  . Reaction, situational, acute, to stress(husband dying) 09/21/2015  . B12 deficiency 08/30/2015  . Dysuria 08/18/2015  . Chronic Fatigue- 2ndary to FM and Mood d/o  06/20/2015  . Memory loss   . Lumbar radiculopathy, chronic   . Herpes simplex labialis 04/03/2012  . Shingles 04/12/2011  . LBBB (left bundle branch block) 11/13/2010  . MIGRAINE HEADACHE 05/30/2010  . Iron deficiency anemia 11/03/2009  . Asthma, mild persistent  12/19/2008  . Hyperlipidemia 08/12/2007  . Anxiety state 08/12/2007  . Essential hypertension 08/12/2007  . Allergic rhinitis 08/12/2007  . G E R D 08/12/2007  . ARTHRITIS 08/12/2007  . Muscle pain, fibromyalgia  08/12/2007     Past Medical History:  Diagnosis Date  . ALLERGIC RHINITIS   . ANEMIA, IRON DEFICIENCY   . ANXIETY   . ARTHRITIS    s/p bilateral THR  . ASTHMA, EXTRINSIC   . Bronchitis   . Cataracts, both eyes   . Chronic constipation   . FIBROMYALGIA    fibromyalgia  . G E R D   . High cholesterol   . HYPERLIPIDEMIA   . HYPERTENSION   . Interstitial cystitis   . MIGRAINE HEADACHE      Past Surgical History:  Procedure Laterality Date  . ABDOMINAL HYSTERECTOMY    . ANTERIOR HIP REVISION Right 09/24/2016   Procedure: RIGHT HIP ACETABULUM AND FEMORAL HEAD REVISION WITH BONE GRAFT;  Surgeon: Paralee Cancel, MD;  Location: [facility];  Service: Orthopedics;  Laterality: Right;  . APPENDECTOMY    . BACK SURGERY    . CATARACT EXTRACTION    . COLONOSCOPY    . DOPPLER ECHOCARDIOGRAPHY    . ESOPHAGOGASTRODUODENOSCOPY ENDOSCOPY    . JOINT REPLACEMENT    . LUMBAR DISC SURGERY    . NM MYOVIEW LTD     stress test  . Ovarian cyst removed    . ROTATOR CUFF REPAIR Left 5-6 years ago   Dr Onnie Graham; done at [facility] surgery center   . TOTAL HIP ARTHROPLASTY    . TOTAL SHOULDER ARTHROPLASTY       Family History  Problem Relation Age of Onset  . Arthritis Mother   . Hypertension Mother   . Heart disease Father   . Arthritis Father   . Hypertension Father   . Depression Sister   . Hyperlipidemia Sister   . Hypertension Other   . Hyperlipidemia Other      Social History   Substance and Sexual Activity  Drug Use No     Social History   Substance and Sexual Activity  Alcohol Use No  . Alcohol/week: 0.0 standard drinks     Social History   Tobacco Use  Smoking Status Former Smoker  . Packs/day: 0.10  . Years: 10.00  . Pack years: 1.00  . Types: Cigarettes  . Last attempt to quit: 03/25/1968  . 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 acetaminophen (TYLENOL) 500 MG tablet Take by mouth as needed.  Marland Kitchen albuterol (PROAIR HFA) 108 (90 Base) MCG/ACT inhaler Inhale 2 puffs into the lungs every 6 (six) hours as needed.  Marland Kitchen amLODipine (NORVASC) 10 MG tablet TAKE 1 TABLET BY MOUTH DAILY (Patient taking differently: Take 10 mg by mouth daily. )  . amLODipine (NORVASC) 5 MG tablet Take 5 mg by mouth daily.  . busPIRone (BUSPAR) 15 MG tablet Take 1 tablet (15 mg total) by mouth 3 (three) times daily.  . cetirizine (ZYRTEC) 10 MG tablet Take 10 mg by mouth daily.   . chlorthalidone (HYGROTON) 25 MG tablet TAKE 1/2 TABLET BY MOUTH DAILY  . chlorthalidone (HYGROTON) 25 MG tablet TAKE 1/2 TABLET BY MOUTH DAILY  . Cholecalciferol (VITAMIN D3) 5000 units TABS 5,000 IU OTC vitamin D3 daily.  Marland Kitchen docusate sodium (COLACE)  100 MG capsule Take 1 capsule (100 mg total) by mouth 2 (two) times daily.  . DULoxetine (CYMBALTA) 30 MG capsule Take 1 capsule (30 mg total) by mouth daily.  . ferrous sulfate (FERROUSUL) 325 (65 FE) MG tablet Take 1 tablet (325 mg total) by mouth 3 (three) times daily with meals.  . fluticasone (FLONASE) 50 MCG/ACT nasal spray USE 2 SPRAYS INTO EACH NOSTRIL EVERY DAY  . levofloxacin (LEVAQUIN) 500 MG tablet Take 1 tablet (500 mg total) by mouth daily.  Marland Kitchen losartan (COZAAR) 100 MG tablet Take 1 tablet (100 mg total) by mouth daily.  . methocarbamol (ROBAXIN) 500 MG tablet as needed.  . montelukast (SINGULAIR) 10 MG tablet Take 1 tablet (10 mg total) by mouth at bedtime.  Marland Kitchen omeprazole (PRILOSEC) 20 MG capsule Take 1 capsule (20 mg total) by mouth daily.  . SYMBICORT 80-4.5 MCG/ACT inhaler INHALE 2 PUFFS BY MOUTH TWICE DAILY (Patient taking differently: as needed. )  . traMADol (ULTRAM) 50 MG tablet Take by mouth as needed.  . valACYclovir (VALTREX) 1000 MG tablet Take 1 tablet (1,000 mg total) by mouth 3 (three) times daily.  . vitamin B-12 (CYANOCOBALAMIN) 1000 MCG tablet Take 1 tablet (1,000 mcg total) by  mouth daily.   No facility-administered encounter medications on file as of 01/12/2018.     Allergies: Morphine sulfate  Body mass index is 25.92 kg/m.  Blood pressure 119/68, pulse 94, height 5\' 2"  (1.575 m), weight 141 lb 11.2 oz (64.3 kg), SpO2 97 %.  Review of Systems  Constitutional: Positive for fatigue. Negative for activity change, appetite change, chills, diaphoresis, fever and unexpected weight change.  HENT: Negative for congestion.   Eyes: Negative for visual disturbance.  Respiratory: Negative for cough, chest tightness, shortness of breath, wheezing and stridor.   Cardiovascular: Negative for chest pain, palpitations and leg swelling.  Gastrointestinal: Negative for abdominal distention, abdominal pain, blood in stool, constipation, diarrhea, nausea and vomiting.  Musculoskeletal: Positive for arthralgias, back pain, gait problem and myalgias.  Skin: Negative for color change, pallor, rash and wound.  Neurological: Negative for dizziness and headaches.  Hematological: Does not bruise/bleed easily.  Psychiatric/Behavioral: Positive for sleep disturbance.       Objective:   Physical Exam  Constitutional: She appears well-developed and well-nourished. No distress.  HENT:  Head: Normocephalic and atraumatic.  Right Ear: External ear normal.  Left Ear: External ear normal.  Nose: Nose normal.  Mouth/Throat: Oropharynx is clear and moist.  Eyes: Pupils are equal, round, and reactive to light. Conjunctivae and EOM are normal.  Cardiovascular: Normal rate, regular rhythm, normal heart sounds and intact distal pulses.  No murmur heard. Pulmonary/Chest: Effort normal and breath sounds normal. No stridor. No respiratory distress. She has no wheezes. She has no rales. She exhibits no tenderness.  Skin: Skin is warm and dry. Capillary refill takes less than 2 seconds. No rash noted. Rash is not vesicular. She is not diaphoretic. There is erythema. There is pallor.     No  new eruptions noted and old eruptions have scabbed over.   Psychiatric: She has a normal mood and affect. Her behavior is normal. Judgment and thought content normal.  Nursing note and vitals reviewed.     Assessment & Plan:   1. Herpes zoster without complication     Shingles No new eruptions noted and old eruptions have scabbed over. Continue using OTC Aloe as needed. Please call clinic with any questions/concerns.    FOLLOW-UP:  Return if symptoms worsen  or fail to improve.

## 2018-01-08 DIAGNOSIS — M9905 Segmental and somatic dysfunction of pelvic region: Secondary | ICD-10-CM | POA: Diagnosis not present

## 2018-01-08 DIAGNOSIS — M9904 Segmental and somatic dysfunction of sacral region: Secondary | ICD-10-CM | POA: Diagnosis not present

## 2018-01-08 DIAGNOSIS — M5136 Other intervertebral disc degeneration, lumbar region: Secondary | ICD-10-CM | POA: Diagnosis not present

## 2018-01-08 DIAGNOSIS — M9903 Segmental and somatic dysfunction of lumbar region: Secondary | ICD-10-CM | POA: Diagnosis not present

## 2018-01-12 ENCOUNTER — Ambulatory Visit (INDEPENDENT_AMBULATORY_CARE_PROVIDER_SITE_OTHER): Payer: Medicare Other | Admitting: Adult Health

## 2018-01-12 ENCOUNTER — Encounter: Payer: Self-pay | Admitting: Adult Health

## 2018-01-12 VITALS — BP 119/68 | HR 94 | Ht 62.0 in | Wt 141.7 lb

## 2018-01-12 DIAGNOSIS — B029 Zoster without complications: Secondary | ICD-10-CM | POA: Diagnosis not present

## 2018-01-12 NOTE — Assessment & Plan Note (Signed)
No new eruptions noted and old eruptions have scabbed over. Continue using OTC Aloe as needed. Please call clinic with any questions/concerns.

## 2018-01-12 NOTE — Patient Instructions (Addendum)
Shingles Shingles, which is also known as herpes zoster, is an infection that causes a painful skin rash and fluid-filled blisters. Shingles is not related to genital herpes, which is a sexually transmitted infection. Shingles only develops in people who:  Have had chickenpox.  Have received the chickenpox vaccine. (This is rare.)  What are the causes? Shingles is caused by varicella-zoster virus (VZV). This is the same virus that causes chickenpox. After exposure to VZV, the virus stays in the body in an inactive (dormant) state. Shingles develops if the virus reactivates. This can happen many years after the initial exposure to VZV. It is not known what causes this virus to reactivate. What increases the risk? People who have had chickenpox or received the chickenpox vaccine are at risk for shingles. Infection is more common in people who:  Are older than age 50.  Have a weakened defense (immune) system, such as those with HIV, AIDS, or cancer.  Are taking medicines that weaken the immune system, such as transplant medicines.  Are under great stress.  What are the signs or symptoms? Early symptoms of this condition include itching, tingling, and pain in an area on your skin. Pain may be described as burning, stabbing, or throbbing. A few days or weeks after symptoms start, a painful red rash appears, usually on one side of the body in a bandlike or beltlike pattern. The rash eventually turns into fluid-filled blisters that break open, scab over, and dry up in about 2-3 weeks. At any time during the infection, you may also develop:  A fever.  Chills.  A headache.  An upset stomach.  How is this diagnosed? This condition is diagnosed with a skin exam. Sometimes, skin or fluid samples are taken from the blisters before a diagnosis is made. These samples are examined under a microscope or sent to a lab for testing. How is this treated? There is no specific cure for this condition.  Your health care provider will probably prescribe medicines to help you manage pain, recover more quickly, and avoid long-term problems. Medicines may include:  Antiviral drugs.  Anti-inflammatory drugs.  Pain medicines.  If the area involved is on your face, you may be referred to a specialist, such as an eye doctor (ophthalmologist) or an ear, nose, and throat (ENT) doctor to help you avoid eye problems, chronic pain, or disability. Follow these instructions at home: Medicines  Take medicines only as directed by your health care provider.  Apply an anti-itch or numbing cream to the affected area as directed by your health care provider. Blister and Rash Care  Take a cool bath or apply cool compresses to the area of the rash or blisters as directed by your health care provider. This may help with pain and itching.  Keep your rash covered with a loose bandage (dressing). Wear loose-fitting clothing to help ease the pain of material rubbing against the rash.  Keep your rash and blisters clean with mild soap and cool water or as directed by your health care provider.  Check your rash every day for signs of infection. These include redness, swelling, and pain that lasts or increases.  Do not pick your blisters.  Do not scratch your rash. General instructions  Rest as directed by your health care provider.  Keep all follow-up visits as directed by your health care provider. This is important.  Until your blisters scab over, your infection can cause chickenpox in people who have never had it or been vaccinated   against it. To prevent this from happening, avoid contact with other people, especially: ? Babies. ? Pregnant women. ? Children who have eczema. ? Elderly people who have transplants. ? People who have chronic illnesses, such as leukemia or AIDS. Contact a health care provider if:  Your pain is not relieved with prescribed medicines.  Your pain does not get better after  the rash heals.  Your rash looks infected. Signs of infection include redness, swelling, and pain that lasts or increases. Get help right away if:  The rash is on your face or nose.  You have facial pain, pain around your eye area, or loss of feeling on one side of your face.  You have ear pain or you have ringing in your ear.  You have loss of taste.  Your condition gets worse. This information is not intended to replace advice given to you by your health care provider. Make sure you discuss any questions you have with your health care provider. Document Released: 03/11/2005 Document Revised: 11/05/2015 Document Reviewed: 01/20/2014 Elsevier Interactive Patient Education  2018 [employer].   No new eruptions noted and old eruptions have scabbed over. Continue using OTC Aloe as needed. Please call clinic with any questions/concerns. NICE TO SEE YOU!

## 2018-01-13 DIAGNOSIS — M5416 Radiculopathy, lumbar region: Secondary | ICD-10-CM | POA: Diagnosis not present

## 2018-01-23 ENCOUNTER — Encounter: Payer: Self-pay | Admitting: Pulmonary Disease

## 2018-01-23 ENCOUNTER — Ambulatory Visit (INDEPENDENT_AMBULATORY_CARE_PROVIDER_SITE_OTHER): Payer: Medicare Other | Admitting: Pulmonary Disease

## 2018-01-23 VITALS — BP 126/80 | HR 104 | Wt 141.8 lb

## 2018-01-23 DIAGNOSIS — J4521 Mild intermittent asthma with (acute) exacerbation: Secondary | ICD-10-CM | POA: Diagnosis not present

## 2018-01-23 DIAGNOSIS — R0789 Other chest pain: Secondary | ICD-10-CM

## 2018-01-23 DIAGNOSIS — J3089 Other allergic rhinitis: Secondary | ICD-10-CM | POA: Diagnosis not present

## 2018-01-23 MED ORDER — SPACER/AERO-HOLDING CHAMBERS DEVI: 1.0000 | Freq: Once | 0 refills | Status: AC

## 2018-01-23 NOTE — Patient Instructions (Signed)
Allergic rhinitis: Continue montelukast daily Continue cetirizine daily Continue Flonase daily Continue using NeilMed rinses twice a day  Severe persistent asthma with recent exacerbations: Continue Symbicort 2 puffs twice a day Start using Symbicort with a spacer Continue using albuterol as needed for chest tightness wheezing or shortness of breath Call us if you have worsening symptoms  Chest tightness/diaphoresis: New problem I will reach out to Dr. Alvester Chou to discuss what form of ischemia eval would be most appropriate for you (testing to make sure that there is no evidence of coronary artery disease)  We will see you back in 6 weeks or sooner if needed

## 2018-01-23 NOTE — Progress Notes (Signed)
Subjective:    Patient ID: [name], female    DOB: 11/23/1933, 84 y.o.   MRN: [id number]   Synopsis: Former patient of Dr. Joya Gaskins who has moderate persistent asthma.   HPI Chief Complaint  Patient presents with  . Follow-up    increased labored breathing, increasing sweat with exertion    [name] says that she has been more tired lately and has been sweating a lot more when she exerts herself.  She says she notices this at night as well. She denies chest pain.  She has been daeling with R hip and back pain which are chronic for her.    She says that her breathing is about the same as the last time.  However she took prednisone after the last visit which she says helped some.  She says taht she still uses an albuterol "spray" as well as he rSymbicort.  She continues to take her saline rinses and sprays.   She notices a prominent tightness in the middle of her chest when she is short of breath.  She thinks that the albuterol is helpful.    She had some mold in the garage removed.    She has been using CBD oil: "1/3 of a dropper full" orally.   Past Medical History:  Diagnosis Date  . ALLERGIC RHINITIS   . ANEMIA, IRON DEFICIENCY   . ANXIETY   . ARTHRITIS    s/p bilateral THR  . ASTHMA, EXTRINSIC   . Bronchitis   . Cataracts, both eyes   . Chronic constipation   . FIBROMYALGIA    fibromyalgia  . G E R D   . High cholesterol   . HYPERLIPIDEMIA   . HYPERTENSION   . Interstitial cystitis   . MIGRAINE HEADACHE       Review of Systems  Constitutional: Negative for chills, diaphoresis, fatigue and unexpected weight change.  HENT: Negative for rhinorrhea, sinus pressure and sneezing.   Respiratory: Negative for choking, shortness of breath and wheezing.   Cardiovascular: Negative for chest pain, palpitations and leg swelling.       Objective:   Physical Exam Vitals:   01/23/18 1452  BP: 126/80  Pulse: (!) 104  SpO2: 95%  Weight: 141 lb 12.8 oz (64.3  kg)    Gen: well appearing HENT: OP clear, TM's clear, neck supple PULM: CTA B, normal percussion CV: RRR, no mgr, trace edema GI: BS+, soft, nontender Derm: no cyanosis or rash Psyche: normal mood and affect   Imaging: 11/2017 CXR images reveiweed: Hyperinflation, no infiltrate  Labs: March 2017 eosinophil count 100 cells per microliter, IgE 3  PFT May 2017 pulmonary function testing ratio 86%, FEV1 2.10 L (113% predicted), FVC 2.45 (98% predicted), no change in FEV1 with bronchodilator, total lung capacity 4.57 L (93% predicted). DLCO 17.47 (76% predicted). December 19, 2017 spirometry ratio 49%, FEV1 1.1 L 66% predicted, good start, good effort acceptable by ATS criteria  FENO December 19, 2017 exhaled nitric oxide 15 ppm     Assessment & Plan:   Mild intermittent asthma with acute exacerbation  Allergic rhinitis due to other allergic trigger, unspecified seasonality  Chest tightness  Discussion: On the last visit her lung function testing showed clear airflow obstruction.  At this point she is not wheezing on physical exam but she is noticing ongoing chest tightness and diaphoresis with exertion which bothers me for the possibility of ischemic cardiac disease. If the stress test (or which ever test is  appropriate for her) does not show convincing evidence of ischemic cardiac disease then we may need to consider biologic therapy.  In the meantime we will continue her on an aggressive asthma regimen.  Plan: Allergic rhinitis: Continue montelukast daily Continue cetirizine daily Continue Flonase daily Continue using NeilMed rinses twice a day  Severe persistent asthma with recent exacerbations: Continue Symbicort 2 puffs twice a day Start using Symbicort with a spacer Continue using albuterol as needed for chest tightness wheezing or shortness of breath Call us if you have worsening symptoms  Chest tightness/diaphoresis: New problem I will reach out to Dr. Alvester Chou  to discuss what form of ischemia eval would be most appropriate for you (testing to make sure that there is no evidence of coronary artery disease)  We will see you back in 6 weeks or sooner if needed  Current Outpatient Medications:  .  acetaminophen (TYLENOL) 500 MG tablet, Take by mouth as needed., Disp: , Rfl:  .  albuterol (PROAIR HFA) 108 (90 Base) MCG/ACT inhaler, Inhale 2 puffs into the lungs every 6 (six) hours as needed., Disp: 1 Inhaler, Rfl: 5 .  amLODipine (NORVASC) 10 MG tablet, TAKE 1 TABLET BY MOUTH DAILY (Patient taking differently: Take 10 mg by mouth daily. ), Disp: 90 tablet, Rfl: 3 .  amLODipine (NORVASC) 5 MG tablet, Take 5 mg by mouth daily., Disp: , Rfl:  .  busPIRone (BUSPAR) 15 MG tablet, Take 1 tablet (15 mg total) by mouth 3 (three) times daily., Disp: 90 tablet, Rfl: 5 .  cetirizine (ZYRTEC) 10 MG tablet, Take 10 mg by mouth daily. , Disp: , Rfl:  .  chlorthalidone (HYGROTON) 25 MG tablet, TAKE 1/2 TABLET BY MOUTH DAILY, Disp: 15 tablet, Rfl: 0 .  chlorthalidone (HYGROTON) 25 MG tablet, TAKE 1/2 TABLET BY MOUTH DAILY, Disp: 15 tablet, Rfl: 7 .  Cholecalciferol (VITAMIN D3) 5000 units TABS, 5,000 IU OTC vitamin D3 daily., Disp: 90 tablet, Rfl: 3 .  docusate sodium (COLACE) 100 MG capsule, Take 1 capsule (100 mg total) by mouth 2 (two) times daily., Disp: 10 capsule, Rfl: 0 .  DULoxetine (CYMBALTA) 30 MG capsule, Take 1 capsule (30 mg total) by mouth daily., Disp: 90 capsule, Rfl: 1 .  ferrous sulfate (FERROUSUL) 325 (65 FE) MG tablet, Take 1 tablet (325 mg total) by mouth 3 (three) times daily with meals., Disp: , Rfl:  .  fluticasone (FLONASE) 50 MCG/ACT nasal spray, USE 2 SPRAYS INTO EACH NOSTRIL EVERY DAY, Disp: 48 g, Rfl: 3 .  losartan (COZAAR) 100 MG tablet, Take 1 tablet (100 mg total) by mouth daily., Disp: 90 tablet, Rfl: 1 .  methocarbamol (ROBAXIN) 500 MG tablet, as needed., Disp: , Rfl:  .  montelukast (SINGULAIR) 10 MG tablet, Take 1 tablet (10 mg total) by  mouth at bedtime., Disp: 90 tablet, Rfl: 3 .  omeprazole (PRILOSEC) 20 MG capsule, Take 1 capsule (20 mg total) by mouth daily., Disp: 90 capsule, Rfl: 1 .  SYMBICORT 80-4.5 MCG/ACT inhaler, INHALE 2 PUFFS BY MOUTH TWICE DAILY (Patient taking differently: as needed. ), Disp: 10.2 g, Rfl: 5 .  traMADol (ULTRAM) 50 MG tablet, Take by mouth as needed., Disp: , Rfl:  .  valACYclovir (VALTREX) 1000 MG tablet, Take 1 tablet (1,000 mg total) by mouth 3 (three) times daily., Disp: 21 tablet, Rfl: 0 .  vitamin B-12 (CYANOCOBALAMIN) 1000 MCG tablet, Take 1 tablet (1,000 mcg total) by mouth daily., Disp: 90 tablet, Rfl: 3

## 2018-01-26 ENCOUNTER — Telehealth: Payer: Self-pay

## 2018-01-26 ENCOUNTER — Ambulatory Visit (INDEPENDENT_AMBULATORY_CARE_PROVIDER_SITE_OTHER): Payer: Medicare Other | Admitting: Family Medicine

## 2018-01-26 ENCOUNTER — Encounter: Payer: Self-pay | Admitting: Family Medicine

## 2018-01-26 VITALS — BP 123/71 | HR 80 | Temp 98.7°F | Ht 62.0 in | Wt 141.0 lb

## 2018-01-26 DIAGNOSIS — F4323 Adjustment disorder with mixed anxiety and depressed mood: Secondary | ICD-10-CM | POA: Diagnosis not present

## 2018-01-26 DIAGNOSIS — Z5320 Procedure and treatment not carried out because of patient's decision for unspecified reasons: Secondary | ICD-10-CM | POA: Diagnosis not present

## 2018-01-26 DIAGNOSIS — E559 Vitamin D deficiency, unspecified: Secondary | ICD-10-CM | POA: Diagnosis not present

## 2018-01-26 DIAGNOSIS — E7800 Pure hypercholesterolemia, unspecified: Secondary | ICD-10-CM | POA: Diagnosis not present

## 2018-01-26 DIAGNOSIS — F411 Generalized anxiety disorder: Secondary | ICD-10-CM

## 2018-01-26 DIAGNOSIS — I10 Essential (primary) hypertension: Secondary | ICD-10-CM | POA: Diagnosis not present

## 2018-01-26 DIAGNOSIS — J4531 Mild persistent asthma with (acute) exacerbation: Secondary | ICD-10-CM

## 2018-01-26 DIAGNOSIS — R7303 Prediabetes: Secondary | ICD-10-CM | POA: Diagnosis not present

## 2018-01-26 DIAGNOSIS — R5382 Chronic fatigue, unspecified: Secondary | ICD-10-CM | POA: Diagnosis not present

## 2018-01-26 LAB — POCT GLYCOSYLATED HEMOGLOBIN (HGB A1C): HEMOGLOBIN A1C: 6.2 % — AB (ref 4.0–5.6)

## 2018-01-26 MED ORDER — BUSPIRONE HCL 15 MG PO TABS: 7.5000 mg | ORAL_TABLET | Freq: Three times a day (TID) | ORAL | 5 refills | Status: DC

## 2018-01-26 MED ORDER — DULOXETINE HCL 30 MG PO CPEP: 30.0000 mg | ORAL_CAPSULE | Freq: Every day | ORAL | 1 refills | Status: DC

## 2018-01-26 NOTE — Progress Notes (Signed)
Impression and Recommendations:    1. Prediabetes   2. Essential hypertension   3. Adjustment disorder with mixed anxiety and depressed mood   4. Anxiety state:   h/o panic d/o   5. Mild persistent asthma with acute exacerbation   6. Pure hypercholesterolemia   7. Medication refused- (any cholesterol ones)   8. Vitamin D deficiency   9. Anxiety state   10. Chronic fatigue- 2ndary to FM and Mood d/o      1. Mood/Adjustment Disorder  - Halved dose of Buspar today to reduce blunted emotional response.  Encouraged patient to take 7.5 three times daily.  As long as anxiety is still well controlled, reviewed that  patient may continue to decrease dose to 5 mg TID in future, PRN.  - See changes to med list below.  - Patient knows to let us know how she feels on decreased dose of Buspar, and if she experiences additional concerns.  - Will continue to monitor.   2. Essential Hypertension  - Blood pressure is at goal at this time.   - Continue treatment plan sa prescribed.  See med list today. - Patient tolerating meds well without complication.  Denies S-E - Will continue to monitor.  - Ambulatory BP monitoring encouraged. Keep log and bring in next OV   3. Mild persistent asthma with acute exacerbation - Stable at this time; managed by pulmonology. - Continue to follow up with pulmonology as established.   4. Cardiology Follow-Up - Future Cholesterol Management - Per pt, experiencing chest pain w/ exertion after recent asthma event. - Pt referred to cardiology by pulmonology; sees Dr. [name].  - Strongly encouraged patient to keep specialty visit as scheduled.  - Per patient preference, prefers to have cholesterol measured and monitored through cardiology.  - Advised patient to ask Dr. [name] about cholesterol management.  Encouraged patient to mention all of her chronic concerns and review them in light of her options for managing cholesterol.  - Reviewed red flag  cardiac symptoms with patient today.  Educated patient about the difference between typical asthma symptoms and cardiac symptoms today.   5. Vitamin D - Need for re-check at this time.  - Continue with supplementation as prescribed.  See med list today.   6. Prediabetes Management - A1c was 6.2 today; prior A1c 11/29/2016 - 5.7. - Reviewed with patient today that A1c can increase due to use of steroids.  - Counseled patient on prevention of diabetes and discussed various treatment options, which often includes dietary and lifestyle modifications as first line.  Importance of low carb/ketogenic diet discussed with patient in addition to regular exercise.    7. Other Specialty Care - Management of R Hip Replacement; [name] - Encouraged patient to continue to follow up with specialty management as recommended, including Dr. Nelva Bush of Orthopedics, Dr. Alvan Dame of Orthopedics, and Dr. Estanislado Pandy of Rheumatology.   8. BMI Counseling Explained to patient what BMI refers to, and what it means medically.    Told patient to think about it as a "medical risk stratification measurement" and how increasing BMI is associated with increasing risk/ or worsening state of various diseases such as hypertension, hyperlipidemia, diabetes, premature OA, depression etc.  American Heart Association guidelines for healthy diet, basically Mediterranean diet, and exercise guidelines of 30 minutes 5 days per week or more discussed in detail.  Health counseling performed.  All questions answered.   9. Lifestyle & Preventative Health Maintenance - Advised patient to  continue working toward exercising to improve overall mental, physical, and emotional health.    - Encouraged patient to engage in daily physical activity, especially a formal exercise routine.  Recommended that the patient eventually strive for at least 150 minutes of moderate cardiovascular activity per week according to guidelines established by the  [facility].   - Encouraged patient to take care in the cold weather and practice prudent exercise habits outdoors.  - Healthy dietary habits encouraged, including low-carb, and high amounts of lean protein in diet.   - Patient should also consume adequate amounts of water.   Education and routine counseling performed. Handouts provided.   10. Follow-Up - Patient with need for Vitamin D re-check at this time. - Continue to return in 4 months for regularly scheduled chronic follow-up.  - Patient knows to return to the clinic PRN for acute concerns, and to seek emergency care if experiencing alarming or red flag symptoms.   Education and routine counseling performed. Handouts provided.   Orders Placed This Encounter  Procedures  . VITAMIN D 25 Hydroxy (Vit-D Deficiency, Fractures)  . POCT glycosylated hemoglobin (Hb A1C)    Medications Discontinued During This Encounter  Medication Reason  . amLODipine (NORVASC) 10 MG tablet Change in therapy  . chlorthalidone (HYGROTON) 25 MG tablet Duplicate  . valACYclovir (VALTREX) 1000 MG tablet Completed Course  . busPIRone (BUSPAR) 15 MG tablet   . DULoxetine (CYMBALTA) 30 MG capsule Reorder     Meds ordered this encounter  Medications  . busPIRone (BUSPAR) 15 MG tablet    Sig: Take 0.5 tablets (7.5 mg total) by mouth 3 (three) times daily.    Dispense:  60 tablet    Refill:  5  . DULoxetine (CYMBALTA) 30 MG capsule    Sig: Take 1 capsule (30 mg total) by mouth daily.    Dispense:  90 capsule    Refill:  1    Gross side effects, risk and benefits, and alternatives of medications and treatment plan in general discussed with patient.  Patient is aware that all medications have potential side effects and we are unable to predict every side effect or drug-drug interaction that may occur.   Patient will call with any questions prior to using medication if they have concerns.  Expresses verbal understanding and consents to current therapy  and treatment regimen.  No barriers to understanding were identified.  Red flag symptoms and signs discussed in detail.  Patient expressed understanding regarding what to do in case of emergency\urgent symptoms  Please see AVS handed out to patient at the end of our visit for further patient instructions/ counseling done pertaining to today's office visit.   Return for 4 mo follow-up for mood, decreased BuSpar and half or sooner prn.     Note:  This document was prepared using Dragon voice recognition software and may include unintentional dictation errors.   This document serves as a record of services personally performed by [name], DO. It was created on her behalf by [name], a trained medical scribe. The creation of this record is based on the scribe's personal observations and the provider's statements to them.    I have reviewed the above medical documentation for accuracy and completeness and I concur.  [name] 01/26/18 2:14 PM   --------------------------------------------------------------------------------------------------------------------------------------------    Subjective:    CC:  Chief Complaint  Patient presents with  . Follow-up    HPI: [name] is a 84 y.o. female who presents to  [facility] today for follow-up of mood.   Patient states today that she's doing okay overall.   However, notes she had a bad asthma attack and shingles since her last visit.  Rheumatology Continues to follow up with Dr. Estanislado Pandy of Rheumatology. Sees her again in early December or latter part of November.  Activity Habits Walks her dog twice daily.  Walks in the morning at 6:30 AM, and plans to walk again at 4:00 PM.  [name] Syndrome Notes that walking does not help with her [name].   States "I don't feel [the pain] when I'm walking; it's when I come in."  Chronic Back Pain Sees Dr. Nelva Bush at [facility] Received injections recently with no relief.  Dermatology Patient is seeing a skin specialist due to spot on her scalp.  Follows up with Dr. Jarome Matin. States "my sister had cancer all over her face."  Asthma Attack & Need for Cardiac Follow-Up Dr. Lake Bells is patient's pulmonologist; went to visit him after her asthma attack.  States she was placed on prednisone for 45 days because she was having trouble breathing.  Last followed up with him this past Friday, 3 days ago.  She is going to see Dr. [name] on recommendation from pulmonology, because "Dr. Lake Bells is not happy with how I'm doing."  Is currently experiencing chest tightness with exertion.  Patient feels that her breathing is okay as long as she's not exerting herself.  Says "if I exert myself I get SOB and I perspire."  Says she perspires all night, even without exertion  Says that pulmonology wants her to see Dr. [name] as soon as possible, with Dr. Lake Bells stating something like "if you do not hear from him by Wednesday, let me know."  Notes several times that her pulmonologist is very eager for her to follow up with cardiology.  Patient discussed that her typical asthma symptoms are coughing and being unable to breathe.  Notes that she was recently experiencing both of these symptoms after not having an asthma attack in years.  Labor Day weekend, on Friday, when the asthma attack started, she felt typical asthma symptoms.  Confirms that now, the symptoms have moved into chest tightness, tightness with exertion, and more SOB than usual.  Patient jokes that she needs to follow up with cardiology "since I have high cholesterol and never take the cholesterol medication."  Mood/Anxiety Patient states since starting Buspar, "I do not have the anxiety that I had, but now I don't give a damn."  Says "I might need to wash a load of clothes, but if I'm sitting (and I have to sit because of the back and the hip), and I'm always  doing something.  I either crochet, knit, read, I have a new Bible study which takes up some time; I don't care if I need to wash clothes or not."  Patient denies sx of depression.  Feels, however, that her use of Buspar has affected her memory.  1. HTN HPI:  -  Her blood pressure has been controlled, per patient "no issues at home."  Denies complaints.  - Patient reports good compliance with blood pressure medications  - Denies medication S-E.   - Smoking Status noted   - She denies new onset of: chest pain, exercise intolerance, shortness of breath, dizziness, visual changes, headache, lower extremity swelling or claudication.   Last 3 blood pressure readings in our office are as follows: BP Readings from Last  3 Encounters:  01/26/18 123/71  01/23/18 126/80  01/12/18 119/68    Filed Weights   01/26/18 0936  Weight: 141 lb (64 kg)     Depression screen [facility] 2/9 01/26/2018 01/12/2018 12/29/2017  Decreased Interest 1 1 1   Down, Depressed, Hopeless 0 0 1  PHQ - 2 Score 1 1 2   Altered sleeping 0 0 0  Tired, decreased energy 2 2 1   Change in appetite 0 0 0  Feeling bad or failure about yourself  0 0 0  Trouble concentrating 0 0 0  Moving slowly or fidgety/restless 0 0 0  Suicidal thoughts 0 0 0  PHQ-9 Score 3 3 3   Difficult doing work/chores Somewhat difficult Somewhat difficult Not difficult at all  Some recent data might be hidden     GAD 7 : Generalized Anxiety Score 10/23/2017 06/02/2017 04/23/2017 01/07/2017  Nervous, Anxious, on Edge 1 3 1 2   Control/stop worrying 3 3 3 2   Worry too much - different things 3 2 1 1   Trouble relaxing 3 3 3 2   Restless 0 0 0 1  Easily annoyed or irritable 1 2 0 2  Afraid - awful might happen 0 0 0 0  Total GAD 7 Score 11 13 8 10   Anxiety Difficulty - - - Somewhat difficult     Wt Readings from Last 3 Encounters:  01/26/18 141 lb (64 kg)  01/23/18 141 lb 12.8 oz (64.3 kg)  01/12/18 141 lb 11.2 oz (64.3 kg)   BP Readings from  Last 3 Encounters:  01/26/18 123/71  01/23/18 126/80  01/12/18 119/68   Pulse Readings from Last 3 Encounters:  01/26/18 80  01/23/18 (!) 104  01/12/18 94   BMI Readings from Last 3 Encounters:  01/26/18 25.79 kg/m  01/23/18 25.94 kg/m  01/12/18 25.92 kg/m         Patient Care Team    Relationship Specialty Notifications Start End  [name], DO PCP - General Family Medicine  08/30/15   Ladene Artist, MD Referring Physician Gastroenterology  07/02/10   Myrlene Broker, MD Referring Physician Urology  07/02/10   Clent Jacks, MD Referring Physician Ophthalmology  07/02/10   [name], MD Referring Physician Cardiology  07/02/10   Danella Sensing, MD Consulting Physician Dermatology  11/09/15   Juanito Doom, MD Consulting Physician Pulmonary Disease  11/09/15   Suella Broad, MD Consulting Physician Physical Medicine and Rehabilitation  11/09/15    Comment: pain mgt :  injections and pain meds  Rockne Menghini, RPH-CPP Pharmacist Cardiology  11/19/15    Comment:  " [facility]" - med mgt of her HTN per request of Dr Alvester Chou- Cards  Alda Berthold, DO Consulting Physician Neurology  12/17/15   Harrington Challenger, [facility]  Pharmacist  07/29/16    Comment: clinical pharm-D in HTN clinic  Paralee Cancel, MD Consulting Physician Orthopedic Surgery  10/23/17    Comment: R hip replacement  Bo Merino, MD Consulting Physician Rheumatology  10/23/17      Patient Active Problem List   Diagnosis Date Noted  . Medication refused- (any cholesterol ones) 01/20/2016    Priority: High  . Prediabetes- 6.0 12/17 01/11/2016    Priority: High  . Adjustment disorder with mixed anxiety and depressed mood 01/11/2016    Priority: High  . Asthma, mild persistent 12/19/2008    Priority: High  . Hyperlipidemia 08/12/2007    Priority: High  . Essential hypertension 08/12/2007    Priority: High  .  Chronic fatigue syndrome with fibromyalgia 01/20/2016    Priority: Medium    . Depression 11/24/2015    Priority: Medium  . B12 deficiency 08/30/2015    Priority: Medium  . Iron deficiency anemia 11/03/2009    Priority: Medium  . Anxiety state 08/12/2007    Priority: Medium  . Muscle pain, fibromyalgia 08/12/2007    Priority: Medium  . Environmental and seasonal allergies 01/20/2016    Priority: Low  . Vitamin D deficiency 01/19/2016    Priority: Low  . Chronic Fatigue- 2ndary to FM and Mood d/o  06/20/2015    Priority: Low  . Memory loss     Priority: Low  . Lumbar radiculopathy, chronic     Priority: Low  . LBBB (left bundle branch block) 11/13/2010    Priority: Low  . G E R D 08/12/2007    Priority: Low  . Primary osteoarthritis of both hands 10/21/2017  . DDD (degenerative disc disease), lumbar 10/21/2017  . History of COPD 10/01/2017  . Former smoker 10/01/2017  . Pain in joint of right shoulder 06/25/2017  . Trochanteric bursitis of right hip 06/25/2017  . Primary [name] disease without gangrene-  hands and feet 06/02/2017  . Iron deficiency anemia, unspecified 04/23/2017  . GAD (generalized anxiety disorder) 04/23/2017  . Insomnia 04/23/2017  . S/P right TH revision 09/24/2016  . Medication intolerance- statins- (make her FM much W) 01/10/2016  . Reaction, situational, acute, to stress(husband dying) 09/21/2015  . Dysuria 08/18/2015  . Herpes simplex labialis 04/03/2012  . Shingles 04/12/2011  . MIGRAINE HEADACHE 05/30/2010  . Allergic rhinitis 08/12/2007  . ARTHRITIS 08/12/2007    Past Medical history, Surgical history, Family history, Social history, Allergies and Medications have been entered into the medical record, reviewed and changed as needed.    Current Meds  Medication Sig  . acetaminophen (TYLENOL) 500 MG tablet Take by mouth as needed.  Marland Kitchen albuterol (PROAIR HFA) 108 (90 Base) MCG/ACT inhaler Inhale 2 puffs into the lungs every 6 (six) hours as needed.  Marland Kitchen amLODipine (NORVASC) 5 MG tablet Take 5 mg by mouth daily.  .  busPIRone (BUSPAR) 15 MG tablet Take 0.5 tablets (7.5 mg total) by mouth 3 (three) times daily.  . cetirizine (ZYRTEC) 10 MG tablet Take 10 mg by mouth daily.   . chlorthalidone (HYGROTON) 25 MG tablet TAKE 1/2 TABLET BY MOUTH DAILY  . Cholecalciferol (VITAMIN D3) 5000 units TABS 5,000 IU OTC vitamin D3 daily.  Marland Kitchen docusate sodium (COLACE) 100 MG capsule Take 1 capsule (100 mg total) by mouth 2 (two) times daily.  . DULoxetine (CYMBALTA) 30 MG capsule Take 1 capsule (30 mg total) by mouth daily.  . ferrous sulfate (FERROUSUL) 325 (65 FE) MG tablet Take 1 tablet (325 mg total) by mouth 3 (three) times daily with meals.  . fluticasone (FLONASE) 50 MCG/ACT nasal spray USE 2 SPRAYS INTO EACH NOSTRIL EVERY DAY  . losartan (COZAAR) 100 MG tablet Take 1 tablet (100 mg total) by mouth daily.  . methocarbamol (ROBAXIN) 500 MG tablet as needed.  . montelukast (SINGULAIR) 10 MG tablet Take 1 tablet (10 mg total) by mouth at bedtime.  Marland Kitchen omeprazole (PRILOSEC) 20 MG capsule Take 1 capsule (20 mg total) by mouth daily.  . SYMBICORT 80-4.5 MCG/ACT inhaler INHALE 2 PUFFS BY MOUTH TWICE DAILY (Patient taking differently: as needed. )  . traMADol (ULTRAM) 50 MG tablet Take by mouth as needed.  . vitamin B-12 (CYANOCOBALAMIN) 1000 MCG tablet Take 1 tablet (1,000 mcg  total) by mouth daily.  . [DISCONTINUED] busPIRone (BUSPAR) 15 MG tablet Take 1 tablet (15 mg total) by mouth 3 (three) times daily.  . [DISCONTINUED] DULoxetine (CYMBALTA) 30 MG capsule Take 1 capsule (30 mg total) by mouth daily.    Allergies:  Allergies  Allergen Reactions  . Morphine Sulfate Other (See Comments)    Makes her hyper     Review of Systems: Review of Systems: General:   No F/C, wt loss Pulm:   No DIB, SOB, pleuritic chest pain Card:  No CP, palpitations Abd:  No n/v/d or pain Ext:  No inc edema from baseline Psych: no SI/ HI    Objective:   Blood pressure 123/71, pulse 80, temperature 98.7 F (37.1 C), height 5\' 2"   (1.575 m), weight 141 lb (64 kg), SpO2 98 %. Body mass index is 25.79 kg/m. General:  Well Developed, well nourished, appropriate for stated age.  Neuro:  Alert and oriented,  extra-ocular muscles intact  HEENT:  Normocephalic, atraumatic, neck supple, no carotid bruits appreciated  Skin:  no gross rash, warm, pink. Cardiac:  RRR, S1 S2 Respiratory:  ECTA B/L and A/P, Not using accessory muscles, speaking in full sentences- unlabored. Vascular:  Ext warm, no cyanosis apprec.; cap RF less 2 sec. Psych:  No HI/SI, judgement and insight good, Euthymic mood. Full Affect.

## 2018-01-26 NOTE — Telephone Encounter (Signed)
-----   Message from Juanito Doom, MD sent at 01/26/2018 10:04 AM EST ----- Thanks for the reply ----- Message ----- From: [name], MD Sent: 01/25/2018   4:20 PM EST To: Newt Minion, RN, Juanito Doom, MD  Gala Romney out of town for the next 2 weeks but I'll see if I can get my nurse Joelene Millin to get her in    to be seen this week by an APP.  Kingsley Plan  ----- Message ----- From: Juanito Doom, MD Sent: 01/23/2018   3:32 PM EST To: [name], MD  Sonya Barren,  Ms. Skilton has been having some breathing difficult lately which certainly is related to her asthma but today she noted an alarming amount of chest tightness and diaphoresis.  I think she sees you and her husband used to see you as well.  Could you give [name] some guidance in terms of what type of ischemia evaluation she should have?  I am not sure if a CT scan or nuclear study would be best.  Thank you Wilfred Lacy

## 2018-01-26 NOTE — Patient Instructions (Addendum)
Per your request we will hold off on obtaining fasting lipid profile and you will obtain this through Dr. Alvester Chou at his discretion.     Risk factors for prediabetes and type 2 diabetes  Researchers don't fully understand why some people develop prediabetes and type 2 diabetes and others don't.  It's clear that certain factors increase the risk, however, including:  Weight. The more fatty tissue you have, the more resistant your cells become to insulin.  Inactivity. The less active you are, the greater your risk. Physical activity helps you control your weight, uses up glucose as energy and makes your cells more sensitive to insulin.  Family history. Your risk increases if a parent or sibling has type 2 diabetes.  Race. Although it's unclear why, people of certain races - including blacks, Hispanics, American Indians and Asian-Americans - are at higher risk.  Age. Your risk increases as you get older. This may be because you tend to exercise less, lose muscle mass and gain weight as you age. But type 2 diabetes is also increasing dramatically among children, adolescents and younger adults.  Gestational diabetes. If you developed gestational diabetes when you were pregnant, your risk of developing prediabetes and type 2 diabetes later increases. If you gave birth to a baby weighing more than 9 pounds (4 kilograms), you're also at risk of type 2 diabetes.  Polycystic ovary syndrome. For women, having polycystic ovary syndrome - a common condition characterized by irregular menstrual periods, excess hair growth and obesity - increases the risk of diabetes.  High blood pressure. Having blood pressure over 140/90 millimeters of mercury (mm Hg) is linked to an increased risk of type 2 diabetes.  Abnormal cholesterol and triglyceride levels. If you have low levels of high-density lipoprotein (HDL), or "good," cholesterol, your risk of type 2 diabetes is higher. Triglycerides are another type of fat carried  in the blood. People with high levels of triglycerides have an increased risk of type 2 diabetes. Your doctor can let you know what your cholesterol and triglyceride levels are.  A good guide to good carbs: The glycemic index ---If you have diabetes, or at risk for diabetes, you know all too well that when you eat carbohydrates, your blood sugar goes up. The total amount of carbs you consume at a meal or in a snack mostly determines what your blood sugar will do. But the food itself also plays a role. A serving of white rice has almost the same effect as eating pure table sugar - a quick, high spike in blood sugar. A serving of lentils has a slower, smaller effect.  ---Picking good sources of carbs can help you control your blood sugar and your weight. Even if you don't have diabetes, eating healthier carbohydrate-rich foods can help ward off a host of chronic conditions, from heart disease to various cancers to, well, diabetes.  ---One way to choose foods is with the glycemic index (GI). This tool measures how much a food boosts blood sugar.  The glycemic index rates the effect of a specific amount of a food on blood sugar compared with the same amount of pure glucose. A food with a glycemic index of 28 boosts blood sugar only 28% as much as pure glucose. One with a GI of 95 acts like pure glucose.    High glycemic foods result in a quick spike in insulin and blood sugar (also known as blood glucose).  Low glycemic foods have a slower, smaller effect- these are healthier  for you.   Using the glycemic index Using the glycemic index is easy: choose foods in the low GI category instead of those in the high GI category (see below), and go easy on those in between. Low glycemic index (GI of 55 or less): Most fruits and vegetables, beans, minimally processed grains, pasta, low-fat dairy foods, and nuts.  Moderate glycemic index (GI 56 to 69): White and sweet potatoes, corn, white rice, couscous, breakfast  cereals such as Cream of Wheat and Mini Wheats.  High glycemic index (GI of 70 or higher): White bread, rice cakes, most crackers, bagels, cakes, doughnuts, croissants, most packaged breakfast cereals. You can see the values for 100 commons foods and get links to more at www.health.CheapToothpicks.si.  Swaps for lowering glycemic index  Instead of this high-glycemic index food Eat this lower-glycemic index food  White rice Brown rice or converted rice  Instant oatmeal Steel-cut oats  Cornflakes Bran flakes  Baked potato Pasta, bulgur  White bread Whole-grain bread  Corn Peas or leafy greens       Prediabetes Eating Plan  Prediabetes--also called impaired glucose tolerance or impaired fasting glucose--is a condition that causes blood sugar (blood glucose) levels to be higher than normal. Following a healthy diet can help to keep prediabetes under control. It can also help to lower the risk of type 2 diabetes and heart disease, which are increased in people who have prediabetes. Along with regular exercise, a healthy diet:  Promotes weight loss.  Helps to control blood sugar levels.  Helps to improve the way that the body uses insulin.   WHAT DO I NEED TO KNOW ABOUT THIS EATING PLAN?   Use the glycemic index (GI) to plan your meals. The index tells you how quickly a food will raise your blood sugar. Choose low-GI foods. These foods take a longer time to raise blood sugar.  Pay close attention to the amount of carbohydrates in the food that you eat. Carbohydrates increase blood sugar levels.  Keep track of how many calories you take in. Eating the right amount of calories will help you to achieve a healthy weight. Losing about 7 percent of your starting weight can help to prevent type 2 diabetes.  You may want to follow a Mediterranean diet. This diet includes a lot of vegetables, lean meats or fish, whole grains, fruits, and healthy oils and fats.   WHAT FOODS CAN I  EAT?  Grains Whole grains, such as whole-wheat or whole-grain breads, crackers, cereals, and pasta. Unsweetened oatmeal. Bulgur. Barley. Quinoa. Brown rice. Corn or whole-wheat flour tortillas or taco shells. Vegetables Lettuce. Spinach. Peas. Beets. Cauliflower. Cabbage. Broccoli. Carrots. Tomatoes. Squash. Eggplant. Herbs. Peppers. Onions. Cucumbers. Brussels sprouts. Fruits Berries. Bananas. Apples. Oranges. Grapes. Papaya. Mango. Pomegranate. Kiwi. Grapefruit. Cherries. Meats and Other Protein Sources Seafood. Lean meats, such as chicken and [place] or lean cuts of pork and beef. Tofu. Eggs. Nuts. Beans. Dairy Low-fat or fat-free dairy products, such as yogurt, cottage cheese, and cheese. Beverages Water. Tea. Coffee. Sugar-free or diet soda. Seltzer water. Milk. Milk alternatives, such as soy or almond milk. Condiments Mustard. Relish. Low-fat, low-sugar ketchup. Low-fat, low-sugar barbecue sauce. Low-fat or fat-free mayonnaise. Sweets and Desserts Sugar-free or low-fat pudding. Sugar-free or low-fat ice cream and other frozen treats. Fats and Oils Avocado. Walnuts. Olive oil. The items listed above may not be a complete list of recommended foods or beverages. Contact your dietitian for more options.    WHAT FOODS ARE NOT RECOMMENDED?  Grains  Refined white flour and flour products, such as bread, pasta, snack foods, and cereals. Beverages Sweetened drinks, such as sweet iced tea and soda. Sweets and Desserts Baked goods, such as cake, cupcakes, pastries, cookies, and cheesecake. The items listed above may not be a complete list of foods and beverages to avoid. Contact your dietitian for more information.   This information is not intended to replace advice given to you by your health care provider. Make sure you discuss any questions you have with your health care provider.   Document Released: 07/26/2014 Document Reviewed: 07/26/2014 Elsevier Interactive Patient Education  [employer].

## 2018-01-26 NOTE — Telephone Encounter (Signed)
Left message on pt home phone to call back and schedule appointment for this week with APP.

## 2018-01-27 NOTE — Telephone Encounter (Signed)
Called pt as noticed she was scheduled with Dr. [name] on 11/26. Pt per note below needs to be see sooner, so keeping 11/26 appt at this time but also scheduled to see K. Lawrence, DNP on 11/7 @ 10am for DOE and when laying down at night sometimes. Pt aware and not additional questions at this time.

## 2018-01-27 NOTE — Progress Notes (Signed)
Cardiology Office Note   Date:  01/29/2018   ID:  [name], DOB April 30, 1933, MRN [id number]  PCP:  [name], DO  Cardiologist:  Dr. [name]  Chief Complaint  Patient presents with  . Shortness of Breath  . Hypertension     History of Present Illness: [name] is a 84 y.o. female who presents for ongoing assessment and management of hypertension, and hyperlipidemia, with history of Type II diabetes and fibromyalgia. She called our office on  01/27/2018 for complaints of dyspnea and is scheduled today for assessment. She has persistent asthma and is followed by pulmonary. She has chest pain with asthma exacerbation. Normal stress test and echocardiogram in the past.    She describes chest pressure unlike asthma symptoms. She states her asthma is under control, no wheezing or coughing. She has not had any ischemic testing since 2012 to include stress tests or echo.   Past Medical History:  Diagnosis Date  . ALLERGIC RHINITIS   . ANEMIA, IRON DEFICIENCY   . ANXIETY   . ARTHRITIS    s/p bilateral THR  . ASTHMA, EXTRINSIC   . Bronchitis   . Cataracts, both eyes   . Chronic constipation   . FIBROMYALGIA    fibromyalgia  . G E [facility]   . High cholesterol   . HYPERLIPIDEMIA   . HYPERTENSION   . Interstitial cystitis   . MIGRAINE HEADACHE     Past Surgical History:  Procedure Laterality Date  . ABDOMINAL HYSTERECTOMY    . ANTERIOR HIP REVISION Right 09/24/2016   Procedure: RIGHT HIP ACETABULUM AND FEMORAL HEAD REVISION WITH BONE GRAFT;  Surgeon: Paralee Cancel, MD;  Location: [facility];  Service: Orthopedics;  Laterality: Right;  . APPENDECTOMY    . BACK SURGERY    . CATARACT EXTRACTION    . COLONOSCOPY    . DOPPLER ECHOCARDIOGRAPHY    . ESOPHAGOGASTRODUODENOSCOPY ENDOSCOPY    . JOINT REPLACEMENT    . LUMBAR DISC SURGERY    . NM MYOVIEW LTD     stress test  . Ovarian cyst removed    . ROTATOR CUFF REPAIR Left 5-6 years ago   Dr Onnie Graham; done at [facility] center    . TOTAL HIP ARTHROPLASTY    . TOTAL SHOULDER ARTHROPLASTY       Current Outpatient Medications  Medication Sig Dispense Refill  . acetaminophen (TYLENOL) 500 MG tablet Take by mouth as needed.    Marland Kitchen albuterol (PROAIR HFA) 108 (90 Base) MCG/ACT inhaler Inhale 2 puffs into the lungs every 6 (six) hours as needed. 1 Inhaler 5  . amLODipine (NORVASC) 5 MG tablet Take 5 mg by mouth daily.    . busPIRone (BUSPAR) 15 MG tablet Take 0.5 tablets (7.5 mg total) by mouth 3 (three) times daily. 60 tablet 5  . cetirizine (ZYRTEC) 10 MG tablet Take 10 mg by mouth daily.     . chlorthalidone (HYGROTON) 25 MG tablet TAKE 1/2 TABLET BY MOUTH DAILY 15 tablet 7  . Cholecalciferol (VITAMIN D3) 5000 units TABS 5,000 IU OTC vitamin D3 daily. 90 tablet 3  . docusate sodium (COLACE) 100 MG capsule Take 1 capsule (100 mg total) by mouth 2 (two) times daily. 10 capsule 0  . DULoxetine (CYMBALTA) 30 MG capsule Take 1 capsule (30 mg total) by mouth daily. 90 capsule 1  . ferrous sulfate (FERROUSUL) 325 (65 FE) MG tablet Take 1 tablet (325 mg total) by mouth 3 (three) times daily with meals.    Marland Kitchen  fluticasone (FLONASE) 50 MCG/ACT nasal spray USE 2 SPRAYS INTO EACH NOSTRIL EVERY DAY 48 g 3  . losartan (COZAAR) 100 MG tablet Take 1 tablet (100 mg total) by mouth daily. 90 tablet 1  . methocarbamol (ROBAXIN) 500 MG tablet as needed.    . montelukast (SINGULAIR) 10 MG tablet Take 1 tablet (10 mg total) by mouth at bedtime. 90 tablet 3  . omeprazole (PRILOSEC) 20 MG capsule Take 1 capsule (20 mg total) by mouth daily. 90 capsule 1  . SYMBICORT 80-4.5 MCG/ACT inhaler INHALE 2 PUFFS BY MOUTH TWICE DAILY (Patient taking differently: as needed. ) 10.2 g 5  . traMADol (ULTRAM) 50 MG tablet Take by mouth as needed.    . vitamin B-12 (CYANOCOBALAMIN) 1000 MCG tablet Take 1 tablet (1,000 mcg total) by mouth daily. 90 tablet 3   No current facility-administered medications for this visit.     Allergies:   Morphine sulfate     Social History:  The patient  reports that she quit smoking about 49 years ago. Her smoking use included cigarettes. She has a 1.00 pack-year smoking history. She has never used smokeless tobacco. She reports that she does not drink alcohol or use drugs.   Family History:  The patient's family history includes Arthritis in her father and mother; Depression in her sister; Heart disease in her father; Hyperlipidemia in her other and sister; Hypertension in her father, mother, and other.    ROS: All other systems are reviewed and negative. Unless otherwise mentioned in H&P    PHYSICAL EXAM: VS:  BP (!) 142/60   Pulse 89   Ht 5\' 2"  (1.575 m)   Wt 140 lb 12.8 oz (63.9 kg)   SpO2 95%   BMI 25.75 kg/m  , BMI Body mass index is 25.75 kg/m. GEN: Well nourished, well developed, in no acute distress HEENT: normal Neck: no JVD, carotid bruits, or masses Cardiac: RRR; no murmurs, rubs, or gallops,no edema  Respiratory:  Clear to auscultation bilaterally, normal work of breathing GI: soft, nontender, nondistended, + BS MS: no deformity or atrophy Skin: warm and dry, no rash Neuro:  Strength and sensation are intact Psych: euthymic mood, full affect   EKG: Not completed this office visit.   Recent Labs: 10/01/2017: ALT 17; BUN 13; Creat 0.76; Hemoglobin 13.8; Platelets 244; Potassium 3.6; Sodium 135    Lipid Panel    Component Value Date/Time   CHOL 328 (H) 11/29/2016 1136   TRIG 158 (H) 11/29/2016 1136   HDL 66 11/29/2016 1136   CHOLHDL 5.0 (H) 11/29/2016 1136   CHOLHDL 5.5 (H) 01/11/2016 1038   VLDL 34 (H) 01/11/2016 1038   LDLCALC 230 (H) 11/29/2016 1136   LDLDIRECT 195.1 04/12/2011 1347      Wt Readings from Last 3 Encounters:  01/29/18 140 lb 12.8 oz (63.9 kg)  01/26/18 141 lb (64 kg)  01/23/18 141 lb 12.8 oz (64.3 kg)      Other studies Reviewed: None.   ASSESSMENT AND PLAN:  1. Recurrent chest pressure with dyspnea: She states this is different from asthma  pressure. With CVRF to include Diabetes, Age, and HTN, with recurrent symptoms I will schedule her for a Lexiscan myoview and echocardiogram. I think its likely more respiratory than cardiac, with multiple reasons to have chest pain, to include fibromyalgia, anxiety, and recent diagnosis of shingles.  however, will be thorough and check her.   2. Hypertension: BP is slightly elevated. She will continue current regimen until testing is  completed before adjustments.   3. Iron Deficiency Anemia: Followed by her PCP.   4. COPD: She is being followed by pulmonary: I think she will be okay for Lexiscan as there is no active wheezing.    Current medicines are reviewed at length with the patient today.    Labs/ tests ordered today include: Lexiscan and echo.  Phill Myron. West Pugh, ANP, AACC   01/29/2018 11:39 AM    [place] [address] Office [phone] Fax [phone]

## 2018-01-29 ENCOUNTER — Ambulatory Visit (INDEPENDENT_AMBULATORY_CARE_PROVIDER_SITE_OTHER): Payer: Medicare Other | Admitting: Adult Health

## 2018-01-29 ENCOUNTER — Encounter: Payer: Self-pay | Admitting: Adult Health

## 2018-01-29 ENCOUNTER — Encounter: Payer: Self-pay | Admitting: Cardiovascular Disease

## 2018-01-29 VITALS — BP 142/60 | HR 89 | Ht 62.0 in | Wt 140.8 lb

## 2018-01-29 DIAGNOSIS — R0602 Shortness of breath: Secondary | ICD-10-CM

## 2018-01-29 DIAGNOSIS — R079 Chest pain, unspecified: Secondary | ICD-10-CM

## 2018-01-29 DIAGNOSIS — I10 Essential (primary) hypertension: Secondary | ICD-10-CM

## 2018-01-29 NOTE — Patient Instructions (Signed)
Medication Instructions:  NO CHANGES- Your physician recommends that you continue on your current medications as directed. Please refer to the Current Medication list given to you today.  If you need a refill on your cardiac medications before your next appointment, please call your pharmacy.  Labwork: If you have labs (blood work) drawn today and your tests are completely normal, you will receive your results only by: Marland Kitchen MyChart Message (if you have MyChart) OR . A paper copy in the mail If you have any lab test that is abnormal or we need to change your treatment, we will call you to review the results.  Testing/Procedures: Your physician has requested that you have a lexiscan myoview. A cardiac stress test is a cardiological test that measures the heart's ability to respond to external stress in a controlled clinical environment. The stress response is induced by intravenous pharmacological stimulation.   Echocardiogram - Your physician has requested that you have an echocardiogram. Echocardiography is a painless test that uses sound waves to create images of your heart. It provides your doctor with information about the size and shape of your heart and how well your heart's chambers and valves are working. This procedure takes approximately one hour. There are no restrictions for this procedure. This will be performed at our [facility] location - [address].  Special Instructions: Make sure to bring your albuterol inhaler to stress testing  Follow-Up: You will need a follow up appointment in keep scheduled appointment.  You may see  DR berry,  [name], DNP, ANP or one of the following Advanced Practice Providers in your designated Care Team:  Kerin Ransom, PA-C  At [facility], you and your health needs are our priority.  As part of our continuing mission to provide you with exceptional heart care, we have created designated Provider Care Teams.  These Care Teams  include your primary Cardiologist (physician) and Advanced Practice Providers (APPs -  Physician Assistants and Nurse Practitioners) who all work together to provide you with the care you need, when you need it.  Thank you for choosing [facility] at [facility]!!

## 2018-01-30 DIAGNOSIS — L821 Other seborrheic keratosis: Secondary | ICD-10-CM | POA: Diagnosis not present

## 2018-01-30 DIAGNOSIS — L723 Sebaceous cyst: Secondary | ICD-10-CM | POA: Diagnosis not present

## 2018-01-30 DIAGNOSIS — D1801 Hemangioma of skin and subcutaneous tissue: Secondary | ICD-10-CM | POA: Diagnosis not present

## 2018-01-30 DIAGNOSIS — L918 Other hypertrophic disorders of the skin: Secondary | ICD-10-CM | POA: Diagnosis not present

## 2018-02-06 ENCOUNTER — Ambulatory Visit: Payer: Medicare Other | Admitting: Pulmonary Disease

## 2018-02-09 ENCOUNTER — Telehealth (HOSPITAL_COMMUNITY): Payer: Self-pay | Admitting: *Deleted

## 2018-02-09 NOTE — Telephone Encounter (Signed)
Patient given detailed instructions per Myocardial Perfusion Study Information Sheet for the test on 02/11/18 at 0945. Patient notified to arrive 15 minutes early and that it is imperative to arrive on time for appointment to keep from having the test rescheduled.  If you need to cancel or reschedule your appointment, please call the office within 24 hours of your appointment. . Patient verbalized understanding.Timon Geissinger, Ranae Palms

## 2018-02-11 ENCOUNTER — Ambulatory Visit (HOSPITAL_COMMUNITY): Payer: Medicare Other | Attending: Cardiology

## 2018-02-11 ENCOUNTER — Other Ambulatory Visit: Payer: Self-pay

## 2018-02-11 ENCOUNTER — Ambulatory Visit (HOSPITAL_BASED_OUTPATIENT_CLINIC_OR_DEPARTMENT_OTHER): Payer: Medicare Other

## 2018-02-11 DIAGNOSIS — R079 Chest pain, unspecified: Secondary | ICD-10-CM

## 2018-02-11 DIAGNOSIS — I10 Essential (primary) hypertension: Secondary | ICD-10-CM | POA: Insufficient documentation

## 2018-02-11 LAB — MYOCARDIAL PERFUSION IMAGING
CHL CUP RESTING HR STRESS: 77 {beats}/min
CSEPPHR: 110 {beats}/min
LV sys vol: 34 mL
LVDIAVOL: 77 mL (ref 46–106)
SDS: 4
SRS: 1
SSS: 5
TID: 0.98

## 2018-02-11 MED ORDER — TECHNETIUM TC 99M TETROFOSMIN IV KIT
10.5000 | PACK | Freq: Once | INTRAVENOUS | Status: AC | PRN
  Administered 2018-02-11: 10.5 via INTRAVENOUS
  Filled 2018-02-11: qty 11

## 2018-02-11 MED ORDER — TECHNETIUM TC 99M TETROFOSMIN IV KIT
31.1000 | PACK | Freq: Once | INTRAVENOUS | Status: AC | PRN
  Administered 2018-02-11: 31.1 via INTRAVENOUS
  Filled 2018-02-11: qty 32

## 2018-02-11 MED ORDER — REGADENOSON 0.4 MG/5ML IV SOLN
0.4000 mg | Freq: Once | INTRAVENOUS | Status: AC
  Administered 2018-02-11: 0.4 mg via INTRAVENOUS

## 2018-02-11 NOTE — Progress Notes (Signed)
Office Visit Note  Patient: [name]             Date of Birth: Feb 14, 1934           MRN: [id number]             PCP: [name], DO Referring: [name], DO Visit Date: 02/25/2018 Occupation: @GUAROCC @  Subjective:  [name]   History of Present Illness: [name] is a 84 y.o. female with history of [name] disease, osteoarthritis, DDD, and fibromyalgia.  She continues to have symptoms of [name] in both hands and feet.  She denies any digital ulcerations.  She states her toes become very discolored at times.  She has been on Norvasc 5 mg 1 tablet by mouth for many years for management of BP.  She does not feel like Norvasc is helpful for managing [name].  She states she has pain in both hands and feet.  She denies any joint swelling.  She denies any rashes, sores in mouth or nose, swollen lymph nodes, or skin thickening. she has sicca symptoms. She continues to have muscle tenderness due to fibromyalgia.  She has chronic fatigue.  She takes tylenol at bedtime and uses CBD oil to help her sleep at night.  She has chronic lower back pain, and she was previously seeing Dr. Nelva Bush and was receiving injections which are no longer helping.  She continues to have chronic right hip pain despite having a right hip revision in 2018.  She continues to follow up with Dr. Alvan Dame.  She has been walking twice daily and going to water aerobics twice weekly.     Activities of Daily Living:  Patient reports morning stiffness all day.   Patient Denies nocturnal pain.  Difficulty dressing/grooming: Denies Difficulty climbing stairs: Reports Difficulty getting out of chair: Reports Difficulty using hands for taps, buttons, cutlery, and/or writing: Reports  Review of Systems  Constitutional: Positive for fatigue.  HENT: Positive for mouth dryness. Negative for mouth sores, trouble swallowing, trouble swallowing and nose dryness.   Eyes: Positive for dryness. Negative for  pain, redness, itching and visual disturbance.  Respiratory: Negative for cough, hemoptysis, shortness of breath, wheezing and difficulty breathing.   Cardiovascular: Negative for chest pain, palpitations, hypertension and swelling in legs/feet.  Gastrointestinal: Negative for abdominal pain, blood in stool, constipation, diarrhea, nausea and vomiting.  Endocrine: Negative for increased urination.  Genitourinary: Negative for painful urination, nocturia and pelvic pain.  Musculoskeletal: Positive for arthralgias, joint pain and morning stiffness. Negative for joint swelling, myalgias, muscle weakness, muscle tenderness and myalgias.  Skin: Negative for color change, pallor, rash, hair loss, nodules/bumps, skin tightness, ulcers and sensitivity to sunlight.  Allergic/Immunologic: Negative for susceptible to infections.  Neurological: Negative for dizziness, light-headedness, numbness, headaches, memory loss and weakness.  Hematological: Negative for swollen glands.  Psychiatric/Behavioral: Negative for depressed mood, confusion and sleep disturbance. The patient is not nervous/anxious.     PMFS History:  Patient Active Problem List   Diagnosis Date Noted  . Atypical chest pain 02/17/2018  . Primary osteoarthritis of both hands 10/21/2017  . DDD (degenerative disc disease), lumbar 10/21/2017  . History of COPD 10/01/2017  . Former smoker 10/01/2017  . Pain in joint of right shoulder 06/25/2017  . Trochanteric bursitis of right hip 06/25/2017  . Primary [name] disease without gangrene-  hands and feet 06/02/2017  . Iron deficiency anemia, unspecified 04/23/2017  . GAD (generalized anxiety disorder) 04/23/2017  . Insomnia 04/23/2017  . S/P right  TH revision 09/24/2016  . Medication refused- (any cholesterol ones) 01/20/2016  . Environmental and seasonal allergies 01/20/2016  . Chronic fatigue syndrome with fibromyalgia 01/20/2016  . Vitamin D deficiency 01/19/2016  . Prediabetes- 6.0  12/17 01/11/2016  . Adjustment disorder with mixed anxiety and depressed mood 01/11/2016  . Medication intolerance- statins- (make her FM much W) 01/10/2016  . Depression 11/24/2015  . Reaction, situational, acute, to stress(husband dying) 09/21/2015  . B12 deficiency 08/30/2015  . Dysuria 08/18/2015  . Chronic Fatigue- 2ndary to FM and Mood d/o  06/20/2015  . Memory loss   . Lumbar radiculopathy, chronic   . Herpes simplex labialis 04/03/2012  . Shingles 04/12/2011  . LBBB (left bundle branch block) 11/13/2010  . MIGRAINE HEADACHE 05/30/2010  . Iron deficiency anemia 11/03/2009  . Asthma, mild persistent 12/19/2008  . Hyperlipidemia 08/12/2007  . Anxiety state 08/12/2007  . Essential hypertension 08/12/2007  . Allergic rhinitis 08/12/2007  . G E R D 08/12/2007  . ARTHRITIS 08/12/2007  . Muscle pain, fibromyalgia 08/12/2007    Past Medical History:  Diagnosis Date  . ALLERGIC RHINITIS   . ANEMIA, IRON DEFICIENCY   . ANXIETY   . ARTHRITIS    s/p bilateral THR  . ASTHMA, EXTRINSIC   . Bronchitis   . Cataracts, both eyes   . Chronic constipation   . FIBROMYALGIA    fibromyalgia  . G E R D   . High cholesterol   . HYPERLIPIDEMIA   . HYPERTENSION   . Interstitial cystitis   . MIGRAINE HEADACHE     Family History  Problem Relation Age of Onset  . Arthritis Mother   . Hypertension Mother   . Heart disease Father   . Arthritis Father   . Hypertension Father   . Depression Sister   . Hyperlipidemia Sister   . Hypertension Other   . Hyperlipidemia Other    Past Surgical History:  Procedure Laterality Date  . ABDOMINAL HYSTERECTOMY    . ANTERIOR HIP REVISION Right 09/24/2016   Procedure: RIGHT HIP ACETABULUM AND FEMORAL HEAD REVISION WITH BONE GRAFT;  Surgeon: Paralee Cancel, MD;  Location: [facility];  Service: Orthopedics;  Laterality: Right;  . APPENDECTOMY    . BACK SURGERY    . CATARACT EXTRACTION    . COLONOSCOPY    . DOPPLER ECHOCARDIOGRAPHY    .  ESOPHAGOGASTRODUODENOSCOPY ENDOSCOPY    . JOINT REPLACEMENT    . LUMBAR DISC SURGERY    . NM MYOVIEW LTD     stress test  . Ovarian cyst removed    . ROTATOR CUFF REPAIR Left 5-6 years ago   Dr Onnie Graham; done at [facility]   . TOTAL HIP ARTHROPLASTY    . TOTAL SHOULDER ARTHROPLASTY     Social History   Social History Narrative   Lives with husband in a 3 story home.  Has 2 children.  Retired Glass blower/designer.  Education: Scientist, water quality.    Objective: Vital Signs: BP 117/65 (BP Location: Right Arm, Patient Position: Sitting, Cuff Size: Normal)   Pulse 73   Resp 14   Ht 5\' 3"  (1.6 m)   Wt 141 lb (64 kg)   BMI 24.98 kg/m    Physical Exam  Constitutional: She is oriented to person, place, and time. She appears well-developed and well-nourished.  HENT:  Head: Normocephalic and atraumatic.  No oral or nasal ulcerations.  No parotid swelling.  Eyes: Conjunctivae and EOM are normal.  Neck: Normal range of motion.  Cardiovascular: Normal rate, regular rhythm, normal heart sounds and intact distal pulses.  Pulmonary/Chest: Effort normal and breath sounds normal.  Abdominal: Soft. Bowel sounds are normal.  Lymphadenopathy:    She has no cervical adenopathy.  Neurological: She is alert and oriented to person, place, and time.  Skin: Skin is warm and dry. Capillary refill takes 2 to 3 seconds.  No digital ulcerations or signs of gangrene.  No skin thickening or tightness noted.   No malar rash noted.   Psychiatric: She has a normal mood and affect. Her behavior is normal.  Nursing note and vitals reviewed.    Musculoskeletal Exam: C-spine good ROM. Thoracic kyphosis. Thoracic and lumbar spine limited ROM with discomfort.  No midline spinal tenderness. No SI joint tenderness.  Left shoulder limited abduction with discomfort.  Right shoulder full ROM.  She has PIP and DIP synovial thickening consistent with osteoarthritis of bilateral hands.  Hip joints limited range of motion with  discomfort at the right hip.  Knee joints, ankle joints, MTPs, PIPs, DIPs good range of motion with no discomfort.  No warmth or effusion bilateral knee joints.  No tenderness or swelling of ankle joints.  CDAI Exam: CDAI Score: Not documented Patient Global Assessment: Not documented; Provider Global Assessment: Not documented Swollen: Not documented; Tender: Not documented Joint Exam   Not documented   There is currently no information documented on the homunculus. Go to the Rheumatology activity and complete the homunculus joint exam.  Investigation: No additional findings.  Imaging: No results found.  Recent Labs: Lab Results  Component Value Date   WBC 7.0 10/01/2017   HGB 13.8 10/01/2017   PLT 244 10/01/2017   NA 135 10/01/2017   K 3.6 10/01/2017   CL 95 (L) 10/01/2017   CO2 26 10/01/2017   GLUCOSE 96 10/01/2017   BUN 13 10/01/2017   CREATININE 0.76 10/01/2017   BILITOT 0.4 10/01/2017   ALKPHOS 87 11/29/2016   AST 17 10/01/2017   ALT 17 10/01/2017   PROT 7.0 10/01/2017   PROT 6.9 10/01/2017   ALBUMIN 4.7 11/29/2016   CALCIUM 9.8 10/01/2017   GFRAA 85 10/01/2017    Speciality Comments: No specialty comments available.  Procedures:  No procedures performed Allergies: Morphine sulfate   Assessment / Plan:     Visit Diagnoses: [name] disease without gangrene - History of severe [name], ANA 1: 80 NO, ENA negative, cryo negative, RF negative, pan-ANCA negative, hepatitis B and C- all autoimmune work-up is negative: She continues to have intermittent symptoms of [name] in both hands and feet.  No digital ulcerations were noted.  Capillary refill 2-3 seconds.  She has been on Norvasc 5 mg 1 tablet by mouth daily for management of blood pressure for several years.  We discussed wearing gloves and keeping her core body temperature warm.  She has no other features of autoimmune disease at this time.  She was advised to notify us if she develops new or worsening  symptoms. She will follow up in the office in 6 months.   Primary osteoarthritis of both hands - severe: She has PIP and DIP synovial thickening consistent with osteoarthritis.  He has bilateral CMC joint synovial thickening.  No synovitis noted.  She has intermittent discomfort in both hands. She continues to crochet on a regular basis. Joint protection and muscle strengthening were discussed.   S/P right TH revision - 1999 and 2018: Chronic pain.  She follows up with Dr. Alvan Dame.   History of total replacement  of both hip joints - She has limited ROM of both hip joints. She continues to have chronic pain of the right hip. She continues to follow up with Dr. Alvan Dame.  DDD (degenerative disc disease), lumbar - Status post surgery x2: Chronic pain.  She continues to follow up with Dr. Nelva Bush and has injections on a regular basis. The most recent injection was ineffective.    Fibromyalgia: She has positive tender points.  She has chronic fatigue.  She goes to water aerobics twice a week and walks twice daily for at least 15 minutes.  She was encouraged to stay active and exercise on a regular basis.   Vitamin D deficiency: She is taking a vitamin D supplement.   Other fatigue: Chronic.  She has been sleeping well at night.   Other medical conditions are listed as follows:   LBBB (left bundle branch block)  History of COPD  History of hyperlipidemia  History of gastroesophageal reflux (GERD)  Essential hypertension  History of migraine  Former smoker - Quit smoking 40 years ago.    Orders: No orders of the defined types were placed in this encounter.  No orders of the defined types were placed in this encounter.   Face-to-face time spent with patient was 30 minutes. Greater than 50% of time was spent in counseling and coordination of care.  Follow-Up Instructions: Return in about 6 months (around 08/27/2018) for [name] syndrome, Osteoarthritis, Fibromyalgia, DDD.   Hazel Sams  PA-C  I examined and evaluated the patient with Hazel Sams PA.  Patient had good capillary refill.  She had noticed ulcers on my examination today.  Precautions regarding Raynolds were discussed.  She continues to have discomfort in her hip joint and will see Dr. Gladstone Lighter.  The plan of care was discussed as noted above.  Bo Merino, MD Note - This record has been created using Editor, commissioning.  Chart creation errors have been sought, but may not always  have been located. Such creation errors do not reflect on  the standard of medical care.

## 2018-02-17 ENCOUNTER — Encounter

## 2018-02-17 ENCOUNTER — Encounter: Payer: Self-pay | Admitting: Cardiovascular Disease

## 2018-02-17 ENCOUNTER — Ambulatory Visit (INDEPENDENT_AMBULATORY_CARE_PROVIDER_SITE_OTHER): Payer: Medicare Other | Admitting: Cardiovascular Disease

## 2018-02-17 DIAGNOSIS — E7800 Pure hypercholesterolemia, unspecified: Secondary | ICD-10-CM | POA: Diagnosis not present

## 2018-02-17 DIAGNOSIS — R0789 Other chest pain: Secondary | ICD-10-CM | POA: Insufficient documentation

## 2018-02-17 DIAGNOSIS — I10 Essential (primary) hypertension: Secondary | ICD-10-CM | POA: Diagnosis not present

## 2018-02-17 HISTORY — DX: Other chest pain: R07.89

## 2018-02-17 NOTE — Progress Notes (Signed)
02/17/2018 [name]   1934/01/25  [id number]  Primary Physician [name], DO Primary Cardiologist: [name] MD Lupe Carney, [place]  HPI:  [name] is a 84 y.o.  mildly-overweight married Caucasian female, mother of 3 and grandmother of 1 grandchild, who works as a Engineer, manufacturing systems last saw her in the office  08/05/2017.Her husband [name] was a patient of mine as well unfortunatelyhadLuis body dementia and passed away in 10-13-2022 of last year. She lives by herself with her daughter [name] who lives 2 miles away and a son who lives up in [place]..Her risk factors include type 2 diabetes, hypertension, and hyperlipidemia. She does have fibromyalgia. She has had a normal 2D echo and Myoview stress test in the past.  Since I saw her a little over a year ago she unfortunately had to have a redo right total hip replacement on 09/24/2016 by Dr. Alvan Dame.  She denies chest pain or shortness of breath.  She does have purplish discoloration of both feet despite normal pulses and normal Doppler studies for unclear reasons.  She saw [name]  NP in the office 01/29/2018 with atypical chest pain and asthma type symptoms.  Myoview stress test was low risk nonischemic, and echo revealed normal LV function with a anteroapical wall motion abnormality.  I did not think her symptoms were ischemically mediated.  Current Meds  Medication Sig  . acetaminophen (TYLENOL) 500 MG tablet Take by mouth as needed.  Marland Kitchen albuterol (PROAIR HFA) 108 (90 Base) MCG/ACT inhaler Inhale 2 puffs into the lungs every 6 (six) hours as needed.  Marland Kitchen amLODipine (NORVASC) 5 MG tablet Take 5 mg by mouth daily.  . busPIRone (BUSPAR) 15 MG tablet Take 0.5 tablets (7.5 mg total) by mouth 3 (three) times daily.  . cetirizine (ZYRTEC) 10 MG tablet Take 10 mg by mouth daily.   . chlorthalidone (HYGROTON) 25 MG tablet TAKE 1/2 TABLET BY MOUTH DAILY  . Cholecalciferol (VITAMIN D3) 5000 units  TABS 5,000 IU OTC vitamin D3 daily.  Marland Kitchen docusate sodium (COLACE) 100 MG capsule Take 1 capsule (100 mg total) by mouth 2 (two) times daily.  . DULoxetine (CYMBALTA) 30 MG capsule Take 1 capsule (30 mg total) by mouth daily.  . ferrous sulfate (FERROUSUL) 325 (65 FE) MG tablet Take 1 tablet (325 mg total) by mouth 3 (three) times daily with meals.  . fluticasone (FLONASE) 50 MCG/ACT nasal spray USE 2 SPRAYS INTO EACH NOSTRIL EVERY DAY  . losartan (COZAAR) 100 MG tablet Take 1 tablet (100 mg total) by mouth daily.  . methocarbamol (ROBAXIN) 500 MG tablet as needed.  . montelukast (SINGULAIR) 10 MG tablet Take 1 tablet (10 mg total) by mouth at bedtime.  Marland Kitchen omeprazole (PRILOSEC) 20 MG capsule Take 1 capsule (20 mg total) by mouth daily.  . SYMBICORT 80-4.5 MCG/ACT inhaler INHALE 2 PUFFS BY MOUTH TWICE DAILY (Patient taking differently: as needed. )  . traMADol (ULTRAM) 50 MG tablet Take by mouth as needed.  . vitamin B-12 (CYANOCOBALAMIN) 1000 MCG tablet Take 1 tablet (1,000 mcg total) by mouth daily.     Allergies  Allergen Reactions  . Morphine Sulfate Other (See Comments)    Makes her hyper    Social History   Socioeconomic History  . Marital status: Married    Spouse name: Not on file  . Number of children: Not on file  . Years of education: Not on file  . Highest education level: Not  on file  Occupational History  . Not on file  Social Needs  . Financial resource strain: Not on file  . Food insecurity:    Worry: Not on file    Inability: Not on file  . Transportation needs:    Medical: Not on file    Non-medical: Not on file  Tobacco Use  . Smoking status: Former Smoker    Packs/day: 0.10    Years: 10.00    Pack years: 1.00    Types: Cigarettes    Last attempt to quit: 03/25/1968    Years since quitting: 49.9  . Smokeless tobacco: Never Used  . 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 Retired Visual merchandiser and Sexual Activity  . Alcohol use: No     Alcohol/week: 0.0 standard drinks  . Drug use: No  . Sexual activity: Not Currently  Lifestyle  . Physical activity:    Days per week: Not on file    Minutes per session: Not on file  . Stress: Not on file  Relationships  . Social connections:    Talks on phone: Not on file    Gets together: Not on file    Attends religious service: Not on file    Active member of club or organization: Not on file    Attends meetings of clubs or organizations: Not on file    Relationship status: Not on file  . Intimate partner violence:    Fear of current or ex partner: Not on file    Emotionally abused: Not on file    Physically abused: Not on file    Forced sexual activity: Not on file  Other Topics Concern  . Not on file  Social History Narrative   Lives with husband in a 3 story home.  Has 2 children.  Retired Glass blower/designer.  Education: Scientist, water quality.     Review of Systems: General: negative for chills, fever, night sweats or weight changes.  Cardiovascular: negative for chest pain, dyspnea on exertion, edema, orthopnea, palpitations, paroxysmal nocturnal dyspnea or shortness of breath Dermatological: negative for rash Respiratory: negative for cough or wheezing Urologic: negative for hematuria Abdominal: negative for nausea, vomiting, diarrhea, bright red blood per rectum, melena, or hematemesis Neurologic: negative for visual changes, syncope, or dizziness All other systems reviewed and are otherwise negative except as noted above.    Blood pressure 134/73, pulse 97, height 5\' 3"  (1.6 m), weight 140 lb 12.8 oz (63.9 kg), SpO2 95 %.  General appearance: alert and no distress Neck: no adenopathy, no carotid bruit, no JVD, supple, symmetrical, trachea midline and thyroid not enlarged, symmetric, no tenderness/mass/nodules Lungs: clear to auscultation bilaterally Heart: regular rate and rhythm, S1, S2 normal, no murmur, click, rub or gallop Extremities: extremities normal, atraumatic,  no cyanosis or edema Pulses: 2+ and symmetric Skin: Skin color, texture, turgor normal. No rashes or lesions Neurologic: Alert and oriented X 3, normal strength and tone. Normal symmetric reflexes. Normal coordination and gait  EKG not performed today  ASSESSMENT AND PLAN:   Hyperlipidemia History of hyperlipidemia not on statin therapy.  Essential hypertension History of essential hypertension her blood pressure measured today 134/73.  She is on amlodipine, chlorthalidone and losartan.  Continue current meds at current dosing.  Atypical chest pain Possible recently saw [name] in the office 01/29/2018 with atypical chest pain.  Myoview stress test was ordered 02/11/2018 which was entirely normal as was a 2D echocardiogram.  She did have an anteroapical wall motion abnormality  however of unclear significance.  Her pain is atypical, lasts for hours at a time and is not related to activity.      [name] MD FACP,FACC,FAHA, [facility] 02/17/2018 1:48 PM

## 2018-02-17 NOTE — Patient Instructions (Signed)
Medication Instructions:  NONE If you need a refill on your cardiac medications before your next appointment, please call your pharmacy.   Lab work: NONE If you have labs (blood work) drawn today and your tests are completely normal, you will receive your results only by: Marland Kitchen MyChart Message (if you have MyChart) OR . A paper copy in the mail If you have any lab test that is abnormal or we need to change your treatment, we will call you to review the results.  Testing/Procedures: NONE  Follow-Up: At [facility], you and your health needs are our priority.  As part of our continuing mission to provide you with exceptional heart care, we have created designated Provider Care Teams.  These Care Teams include your primary Cardiologist (physician) and Advanced Practice Providers (APPs -  Physician Assistants and Nurse Practitioners) who all work together to provide you with the care you need, when you need it. Please follow up with Sonya Domino, NP in 3 months and [name] in 6 months.  Please call our office 2 months in advance to schedule this appointment.

## 2018-02-17 NOTE — Assessment & Plan Note (Signed)
History of hyperlipidemia not on statin therapy. 

## 2018-02-17 NOTE — Assessment & Plan Note (Signed)
History of essential hypertension her blood pressure measured today 134/73.  She is on amlodipine, chlorthalidone and losartan.  Continue current meds at current dosing.

## 2018-02-17 NOTE — Assessment & Plan Note (Signed)
Possible recently saw [name] in the office 01/29/2018 with atypical chest pain.  Myoview stress test was ordered 02/11/2018 which was entirely normal as was a 2D echocardiogram.  She did have an anteroapical wall motion abnormality however of unclear significance.  Her pain is atypical, lasts for hours at a time and is not related to activity.

## 2018-02-25 ENCOUNTER — Ambulatory Visit (INDEPENDENT_AMBULATORY_CARE_PROVIDER_SITE_OTHER): Payer: Medicare Other | Admitting: Rheumatology

## 2018-02-25 ENCOUNTER — Encounter: Payer: Self-pay | Admitting: Rheumatology

## 2018-02-25 VITALS — BP 117/65 | HR 73 | Resp 14 | Ht 63.0 in | Wt 141.0 lb

## 2018-02-25 DIAGNOSIS — Z8639 Personal history of other endocrine, nutritional and metabolic disease: Secondary | ICD-10-CM | POA: Diagnosis not present

## 2018-02-25 DIAGNOSIS — I7300 Raynaud's syndrome without gangrene: Secondary | ICD-10-CM | POA: Diagnosis not present

## 2018-02-25 DIAGNOSIS — M5136 Other intervertebral disc degeneration, lumbar region: Secondary | ICD-10-CM | POA: Diagnosis not present

## 2018-02-25 DIAGNOSIS — I447 Left bundle-branch block, unspecified: Secondary | ICD-10-CM

## 2018-02-25 DIAGNOSIS — E559 Vitamin D deficiency, unspecified: Secondary | ICD-10-CM | POA: Diagnosis not present

## 2018-02-25 DIAGNOSIS — R5383 Other fatigue: Secondary | ICD-10-CM

## 2018-02-25 DIAGNOSIS — M19041 Primary osteoarthritis, right hand: Secondary | ICD-10-CM | POA: Diagnosis not present

## 2018-02-25 DIAGNOSIS — I10 Essential (primary) hypertension: Secondary | ICD-10-CM

## 2018-02-25 DIAGNOSIS — M19042 Primary osteoarthritis, left hand: Secondary | ICD-10-CM

## 2018-02-25 DIAGNOSIS — Z96643 Presence of artificial hip joint, bilateral: Secondary | ICD-10-CM | POA: Diagnosis not present

## 2018-02-25 DIAGNOSIS — M797 Fibromyalgia: Secondary | ICD-10-CM

## 2018-02-25 DIAGNOSIS — Z96649 Presence of unspecified artificial hip joint: Secondary | ICD-10-CM | POA: Diagnosis not present

## 2018-02-25 DIAGNOSIS — Z87891 Personal history of nicotine dependence: Secondary | ICD-10-CM

## 2018-02-25 DIAGNOSIS — Z8709 Personal history of other diseases of the respiratory system: Secondary | ICD-10-CM

## 2018-02-25 DIAGNOSIS — Z8669 Personal history of other diseases of the nervous system and sense organs: Secondary | ICD-10-CM

## 2018-02-25 DIAGNOSIS — Z8719 Personal history of other diseases of the digestive system: Secondary | ICD-10-CM | POA: Diagnosis not present

## 2018-03-04 DIAGNOSIS — M9904 Segmental and somatic dysfunction of sacral region: Secondary | ICD-10-CM | POA: Diagnosis not present

## 2018-03-04 DIAGNOSIS — M9905 Segmental and somatic dysfunction of pelvic region: Secondary | ICD-10-CM | POA: Diagnosis not present

## 2018-03-04 DIAGNOSIS — M9903 Segmental and somatic dysfunction of lumbar region: Secondary | ICD-10-CM | POA: Diagnosis not present

## 2018-03-04 DIAGNOSIS — M5136 Other intervertebral disc degeneration, lumbar region: Secondary | ICD-10-CM | POA: Diagnosis not present

## 2018-03-05 DIAGNOSIS — M5136 Other intervertebral disc degeneration, lumbar region: Secondary | ICD-10-CM | POA: Diagnosis not present

## 2018-03-05 DIAGNOSIS — M25512 Pain in left shoulder: Secondary | ICD-10-CM | POA: Diagnosis not present

## 2018-03-05 DIAGNOSIS — M9904 Segmental and somatic dysfunction of sacral region: Secondary | ICD-10-CM | POA: Diagnosis not present

## 2018-03-05 DIAGNOSIS — M7061 Trochanteric bursitis, right hip: Secondary | ICD-10-CM | POA: Diagnosis not present

## 2018-03-05 DIAGNOSIS — M25551 Pain in right hip: Secondary | ICD-10-CM | POA: Diagnosis not present

## 2018-03-05 DIAGNOSIS — M9905 Segmental and somatic dysfunction of pelvic region: Secondary | ICD-10-CM | POA: Diagnosis not present

## 2018-03-05 DIAGNOSIS — M9903 Segmental and somatic dysfunction of lumbar region: Secondary | ICD-10-CM | POA: Diagnosis not present

## 2018-03-06 ENCOUNTER — Ambulatory Visit: Payer: Medicare Other | Admitting: Nurse Practitioner

## 2018-03-10 DIAGNOSIS — M9903 Segmental and somatic dysfunction of lumbar region: Secondary | ICD-10-CM | POA: Diagnosis not present

## 2018-03-10 DIAGNOSIS — M5136 Other intervertebral disc degeneration, lumbar region: Secondary | ICD-10-CM | POA: Diagnosis not present

## 2018-03-10 DIAGNOSIS — M9904 Segmental and somatic dysfunction of sacral region: Secondary | ICD-10-CM | POA: Diagnosis not present

## 2018-03-10 DIAGNOSIS — M9905 Segmental and somatic dysfunction of pelvic region: Secondary | ICD-10-CM | POA: Diagnosis not present

## 2018-03-12 DIAGNOSIS — M9905 Segmental and somatic dysfunction of pelvic region: Secondary | ICD-10-CM | POA: Diagnosis not present

## 2018-03-12 DIAGNOSIS — M9903 Segmental and somatic dysfunction of lumbar region: Secondary | ICD-10-CM | POA: Diagnosis not present

## 2018-03-12 DIAGNOSIS — M5136 Other intervertebral disc degeneration, lumbar region: Secondary | ICD-10-CM | POA: Diagnosis not present

## 2018-03-12 DIAGNOSIS — M9904 Segmental and somatic dysfunction of sacral region: Secondary | ICD-10-CM | POA: Diagnosis not present

## 2018-03-24 DIAGNOSIS — M9904 Segmental and somatic dysfunction of sacral region: Secondary | ICD-10-CM | POA: Diagnosis not present

## 2018-03-24 DIAGNOSIS — M9905 Segmental and somatic dysfunction of pelvic region: Secondary | ICD-10-CM | POA: Diagnosis not present

## 2018-03-24 DIAGNOSIS — M5136 Other intervertebral disc degeneration, lumbar region: Secondary | ICD-10-CM | POA: Diagnosis not present

## 2018-03-24 DIAGNOSIS — M9903 Segmental and somatic dysfunction of lumbar region: Secondary | ICD-10-CM | POA: Diagnosis not present

## 2018-04-07 ENCOUNTER — Other Ambulatory Visit: Payer: Self-pay | Admitting: Family Medicine

## 2018-04-07 DIAGNOSIS — F411 Generalized anxiety disorder: Secondary | ICD-10-CM

## 2018-04-16 ENCOUNTER — Encounter: Payer: Self-pay | Admitting: Nurse Practitioner

## 2018-04-16 ENCOUNTER — Ambulatory Visit (INDEPENDENT_AMBULATORY_CARE_PROVIDER_SITE_OTHER): Payer: Medicare Other | Admitting: Nurse Practitioner

## 2018-04-16 DIAGNOSIS — M5136 Other intervertebral disc degeneration, lumbar region: Secondary | ICD-10-CM | POA: Diagnosis not present

## 2018-04-16 DIAGNOSIS — M9904 Segmental and somatic dysfunction of sacral region: Secondary | ICD-10-CM | POA: Diagnosis not present

## 2018-04-16 DIAGNOSIS — M9905 Segmental and somatic dysfunction of pelvic region: Secondary | ICD-10-CM | POA: Diagnosis not present

## 2018-04-16 DIAGNOSIS — J4531 Mild persistent asthma with (acute) exacerbation: Secondary | ICD-10-CM

## 2018-04-16 DIAGNOSIS — M9903 Segmental and somatic dysfunction of lumbar region: Secondary | ICD-10-CM | POA: Diagnosis not present

## 2018-04-16 NOTE — Progress Notes (Signed)
@Patient  ID: [name], female    DOB: 10-05-1933, 85 y.o.   MRN: [id number]  Chief Complaint  Patient presents with  . Follow-up    Mild intermittent asthma with acute exacerbation    Referring provider: [name], DO  HPI 85 year old female former smoker with moderate persistent asthma followed by Dr. Lake Bells.  Tests: Imaging: 11/2017 CXR images reveiweed: Hyperinflation, no infiltrate  Labs: March 2017 eosinophil count 100 cells per microliter, IgE 3  PFT May 2017 pulmonary function testing ratio 86%, FEV1 2.10 L (113% predicted), FVC 2.45 (98% predicted), no change in FEV1 with bronchodilator, total lung capacity 4.57 L (93% predicted). DLCO 17.47 (76% predicted). December 19, 2017 spirometry ratio 49%, FEV1 1.1 L 66% predicted, good start, good effort acceptable by ATS criteria  FENO December 19, 2017 exhaled nitric oxide 15 ppm    OV 04/16/18 - follow up She presents today for 6-week follow-up on asthma.  Was last seen by Dr. Lake Bells on 01/23/2018.  He was concerned at that time for possible ischemic cardiac disease and referred her to cardiology for further work-up. Patient recently underwent cardiac testing.  Cardiologist stated that Myoview stress test was low risk nonischemic, and echo revealed normal LV function with a anteroapical wall motion abnormality.    He did not think her symptoms were ischemically mediated.  Overall she states that her asthma symptoms have improved significantly.  Her only complaint today was some slight postnasal drip.  She is using sinus rinses as directed.  She denies any recent fever.  She is compliant with Symbicort and Proventil.  Is compliant with Singulair and Flonase.  She denies any shortness of breath, chest pain, or edema.   Allergies  Allergen Reactions  . Morphine Sulfate Other (See Comments)    Makes her hyper    Immunization History  Administered Date(s) Administered  . Influenza Split 01/03/2012  .  Influenza Whole 12/19/2008, 12/23/2009, 11/13/2010  . Influenza, High Dose Seasonal PF 12/13/2013, 01/10/2015, 01/11/2016, 01/03/2017, 12/19/2017  . Influenza,inj,Quad PF,6+ Mos 11/19/2012  . Influenza,inj,quad, With Preservative 12/23/2016  . Influenza-Unspecified 01/10/2015, 01/03/2017  . Pneumococcal Conjugate-13 01/28/2013  . Pneumococcal Polysaccharide-23 12/24/2007  . Tdap 11/13/2010    Past Medical History:  Diagnosis Date  . ALLERGIC RHINITIS   . ANEMIA, IRON DEFICIENCY   . ANXIETY   . ARTHRITIS    s/p bilateral THR  . ASTHMA, EXTRINSIC   . Bronchitis   . Cataracts, both eyes   . Chronic constipation   . FIBROMYALGIA    fibromyalgia  . G [facility]   . High cholesterol   . HYPERLIPIDEMIA   . HYPERTENSION   . Interstitial cystitis   . MIGRAINE HEADACHE     Tobacco History: Social History   Tobacco Use  Smoking Status Former Smoker  . Packs/day: 0.10  . Years: 10.00  . Pack years: 1.00  . Types: Cigarettes  . Last attempt to quit: 03/25/1968  . Years since quitting: 50.0  Smokeless Tobacco Never Used  Tobacco Comment   Widowed with two children. Retired Engineer, maintenance (IT) given: Yes Comment: Widowed with two children. Retired Web designer   Outpatient Encounter Medications as of 04/16/2018  Medication Sig  . acetaminophen (TYLENOL) 500 MG tablet Take by mouth as needed.  Marland Kitchen albuterol (PROAIR HFA) 108 (90 Base) MCG/ACT inhaler Inhale 2 puffs into the lungs every 6 (six) hours as needed.  Marland Kitchen amLODipine (NORVASC) 5 MG tablet Take 5 mg by mouth daily.  Marland Kitchen  busPIRone (BUSPAR) 15 MG tablet TAKE 1 TABLET BY MOUTH 3 TIMES DAILY (Patient taking differently: 15 mg. Taking 1/2 tablet 3 times daily)  . cetirizine (ZYRTEC) 10 MG tablet Take 10 mg by mouth daily.   . chlorthalidone (HYGROTON) 25 MG tablet TAKE 1/2 TABLET BY MOUTH DAILY  . Cholecalciferol (VITAMIN D3) 5000 units TABS 5,000 IU OTC vitamin D3 daily.  Marland Kitchen docusate sodium (COLACE) 100 MG capsule Take 1 capsule (100 mg  total) by mouth 2 (two) times daily. (Patient taking differently: Take 100 mg by mouth as needed. )  . DULoxetine (CYMBALTA) 30 MG capsule Take 1 capsule (30 mg total) by mouth daily.  . ferrous sulfate (FERROUSUL) 325 (65 FE) MG tablet Take 1 tablet (325 mg total) by mouth 3 (three) times daily with meals. (Patient taking differently: Take 325 mg by mouth 2 (two) times daily with a meal. )  . fluticasone (FLONASE) 50 MCG/ACT nasal spray USE 2 SPRAYS INTO EACH NOSTRIL EVERY DAY  . losartan (COZAAR) 100 MG tablet Take 1 tablet (100 mg total) by mouth daily.  . methocarbamol (ROBAXIN) 500 MG tablet as needed.  . montelukast (SINGULAIR) 10 MG tablet Take 1 tablet (10 mg total) by mouth at bedtime.  Marland Kitchen omeprazole (PRILOSEC) 20 MG capsule Take 1 capsule (20 mg total) by mouth daily.  . SYMBICORT 80-4.5 MCG/ACT inhaler INHALE 2 PUFFS BY MOUTH TWICE DAILY (Patient taking differently: as needed. )  . traMADol (ULTRAM) 50 MG tablet Take by mouth as needed.  . vitamin B-12 (CYANOCOBALAMIN) 1000 MCG tablet Take 1 tablet (1,000 mcg total) by mouth daily.   No facility-administered encounter medications on file as of 04/16/2018.      Review of Systems  Review of Systems  Constitutional: Negative.  Negative for fever.  HENT: Positive for postnasal drip.   Respiratory: Negative for cough, shortness of breath and wheezing.   Cardiovascular: Negative.  Negative for chest pain, palpitations and leg swelling.  Gastrointestinal: Negative.   Allergic/Immunologic: Negative.   Neurological: Negative.   Psychiatric/Behavioral: Negative.        Physical Exam  BP 124/70 (BP Location: Left Arm, Patient Position: Sitting, Cuff Size: Normal)   Pulse 78   Ht 5\' 2"  (1.575 m)   Wt 138 lb (62.6 kg)   SpO2 97%   BMI 25.24 kg/m   Wt Readings from Last 5 Encounters:  04/16/18 138 lb (62.6 kg)  02/25/18 141 lb (64 kg)  02/17/18 140 lb 12.8 oz (63.9 kg)  02/11/18 140 lb (63.5 kg)  01/29/18 140 lb 12.8 oz (63.9  kg)     Physical Exam Vitals signs and nursing note reviewed.  Constitutional:      General: She is not in acute distress.    Appearance: She is well-developed.  Cardiovascular:     Rate and Rhythm: Normal rate and regular rhythm.  Pulmonary:     Effort: Pulmonary effort is normal. No respiratory distress.     Breath sounds: Normal breath sounds. No wheezing, rhonchi or rales.  Musculoskeletal:        General: No swelling.  Neurological:     Mental Status: She is alert and oriented to person, place, and time.      Assessment & Plan:   Asthma, mild persistent Patient recently underwent cardiac testing.  Cardiologist stated that Myoview stress test was low risk nonischemic, and echo revealed normal LV function with a anteroapical wall motion abnormality.    He did not think her symptoms were ischemically mediated.  Overall she states that her asthma symptoms have improved significantly.  Her only complaint today was some slight postnasal drip.  She is using sinus rinses as directed.  We will schedule follow-up with Dr. Lake Bells in 3 months.  If symptoms return or worsen will consider rechecking  IgE, CBC, Feno to reevaluate for possible treatment with Biologics.  Patient Instructions  Continue montelukast daily Continue cetirizine daily Continue Flonase daily Continue using Neilmed rinses twice daily May try mucinex twice daily  Severe persistent asthma with recent exacerbations: Continue Symbicort 2 puffs twice a day with spacer Continue using albuterol as needed for chest tightness, wheezing, or shortness of breath  Follow up with Dr. Lake Bells in 3 months or sooner if needed       Fenton Foy, NP 04/16/2018

## 2018-04-16 NOTE — Patient Instructions (Signed)
Continue montelukast daily Continue cetirizine daily Continue Flonase daily Continue using Neilmed rinses twice daily May try mucinex twice daily  Severe persistent asthma with recent exacerbations: Continue Symbicort 2 puffs twice a day with spacer Continue using albuterol as needed for chest tightness, wheezing, or shortness of breath  Follow up with Dr. Lake Bells in 3 months or sooner if needed

## 2018-04-16 NOTE — Assessment & Plan Note (Addendum)
Patient recently underwent cardiac testing.  Cardiologist stated that Myoview stress test was low risk nonischemic, and echo revealed normal LV function with a anteroapical wall motion abnormality.    He did not think her symptoms were ischemically mediated.  Overall she states that her asthma symptoms have improved significantly.  Her only complaint today was some slight postnasal drip.  She is using sinus rinses as directed.  We will schedule follow-up with Dr. Lake Bells in 3 months.  If symptoms return or worsen will consider rechecking  IgE, CBC, Feno to reevaluate for possible treatment with Biologics.  Patient Instructions  Continue montelukast daily Continue cetirizine daily Continue Flonase daily Continue using Neilmed rinses twice daily May try mucinex twice daily  Severe persistent asthma with recent exacerbations: Continue Symbicort 2 puffs twice a day with spacer Continue using albuterol as needed for chest tightness, wheezing, or shortness of breath  Follow up with Dr. Lake Bells in 3 months or sooner if needed

## 2018-04-16 NOTE — Progress Notes (Signed)
Reviewed, agree 

## 2018-04-17 DIAGNOSIS — M7062 Trochanteric bursitis, left hip: Secondary | ICD-10-CM | POA: Diagnosis not present

## 2018-04-17 DIAGNOSIS — M7061 Trochanteric bursitis, right hip: Secondary | ICD-10-CM | POA: Diagnosis not present

## 2018-04-20 ENCOUNTER — Encounter: Payer: Self-pay | Admitting: Family Medicine

## 2018-04-20 ENCOUNTER — Ambulatory Visit (INDEPENDENT_AMBULATORY_CARE_PROVIDER_SITE_OTHER): Payer: Medicare Other | Admitting: Family Medicine

## 2018-04-20 VITALS — BP 140/78 | HR 88 | Temp 98.2°F | Ht 62.0 in | Wt 136.5 lb

## 2018-04-20 DIAGNOSIS — R7303 Prediabetes: Secondary | ICD-10-CM

## 2018-04-20 DIAGNOSIS — R5382 Chronic fatigue, unspecified: Secondary | ICD-10-CM | POA: Diagnosis not present

## 2018-04-20 DIAGNOSIS — F4323 Adjustment disorder with mixed anxiety and depressed mood: Secondary | ICD-10-CM | POA: Diagnosis not present

## 2018-04-20 DIAGNOSIS — E559 Vitamin D deficiency, unspecified: Secondary | ICD-10-CM | POA: Diagnosis not present

## 2018-04-20 DIAGNOSIS — D509 Iron deficiency anemia, unspecified: Secondary | ICD-10-CM

## 2018-04-20 DIAGNOSIS — E538 Deficiency of other specified B group vitamins: Secondary | ICD-10-CM | POA: Diagnosis not present

## 2018-04-20 DIAGNOSIS — M797 Fibromyalgia: Secondary | ICD-10-CM | POA: Diagnosis not present

## 2018-04-20 NOTE — Progress Notes (Signed)
Pt here for an acute care OV today  Impression and Recommendations:    1. Chronic fatigue-  secondary to fibromyalgia and mood disorder   2. Muscle pain, fibromyalgia   3. Adjustment disorder with mixed anxiety and depressed mood   4. Prediabetes- 6.0 12/17   5. B12 deficiency   6. Iron deficiency anemia, unspecified iron deficiency anemia type   7. Vitamin D deficiency     - Discussed need for patient to continue to obtain management and screenings with all established specialists (Dr. Estanislado Pandy of Rheumatology, Dr. Alvan Dame of Orthopedics, etc.).  Educated patient at length about the critical importance of keeping health maintenance up to date.  - Participated in lengthy conversation and all questions were answered.  - Patient knows to follow up in near future for chronic care.  1. Chronic Fatigue - Secondary to Fibromyalgia & Mood Disorder - Continues to be managed on Cymbalta and Buspar. - Mood stable at this time. - Continue treatment plan as recommended.  See med list today.  - Discussed that both depression and fibromyalgia may cause [name] to feel tired, fatigued, and experience lack of energy.  - Patient knows to continue to follow up with Dr. Estanislado Pandy as established for her specialized care and fibromyalgia.  - Will continue to monitor.  - Encouraged patient to engage in prudent physical activity, especially formal exercise routine, as often as possible and tolerated.  2. Lack of Energy & Night/Morning Sweats - Reviewed possible common causes of patient's symptoms at length today. - Discussed that fatigue can be caused by low lab values, such as Vitamin D deficiency.  - Need for lab work today.  Ordered and drawn.  3. Prediabetes - Last checked in early November. - A1c jumped up to 6.2 from 5.8 last check.  - Reviewed that her fatigue and sweating may be caused by increased A1c.  4. History of B12 Deficiency - 04/23/2017 = last B12, 1,071. - Maintained on B12  tablet supplementation.  - B12 drawn today.  5. Thyroid - Need for thyroid check - Last TSH, T3, T4 checked in September of 2018. - Need for TSH, Free T3, and Free T4 today.  6. Iron Deficiency Anemia - Continues to be managed on iron tablets. - Lab work drawn today.   Orders Placed This Encounter  Procedures  . B12  . CBC with Differential/Platelet  . Hemoglobin A1c  . TSH  . T4, free  . T3, free  . VITAMIN D 25 Hydroxy (Vit-D Deficiency, Fractures)     Education and routine counseling performed. Handouts provided  Gross side effects, risk and benefits, and alternatives of medications and treatment plan in general discussed with patient.  Patient is aware that all medications have potential side effects and we are unable to predict every side effect or drug-drug interaction that may occur.   Patient will call with any questions prior to using medication if they have concerns.    Expresses verbal understanding and consents to current therapy and treatment regimen.  No barriers to understanding were identified.  Red flag symptoms and signs discussed in detail.  Patient expressed understanding regarding what to do in case of emergency\urgent symptoms   Please see AVS handed out to patient at the end of our visit for further patient instructions/ counseling done pertaining to today's office visit.   Return for Please keep your chronic follow-ups as previously discussed..     Note:  This document was prepared occasionally using Dragon voice  recognition software and may include unintentional dictation errors in addition to a scribe.  This document serves as a record of services personally performed by [name], DO. It was created on her behalf by [name], a trained medical scribe. The creation of this record is based on the scribe's personal observations and the provider's statements to them.   I have reviewed the above medical documentation for accuracy and  completeness and I concur.  [name], DO 04/20/2018 6:54 PM        -------------------------------------------------------------------------------------------------------------------------------    Subjective:    CC:  Chief Complaint  Patient presents with  . sweats and decreased energy    HPI: [name] is a 85 y.o. female who presents to [place] at [facility] today for issues as discussed below.  Was going to the [facility] for water aerobics, and really noticed the lack of energy after having the "shingles and bronchitis thing," she was wiped totally out and hasn't recovered/gotten back.  She would try to go and couldn't do half a class.  Having sweats in the mornings.  Notes she would wake up at night and be "soaking wet from the waist up," particularly her head.  Notes that this has somewhat improved since the waist up, but her head still perspires at night.  Usually in the mornings after she's been up a little bit, "most of the time it's before I eat," I'll just have another sweat "from the waist up," "water dripping off."  Notes she is also tired, more so than usual.  "I think that we need to check the thyroid."  The sweating at nighttime started in the fall, but has since subsided and "is not as bad as the one she has after she's been up and moving around."  That has been occurring since "part of December, all of January."  Continues on Cymbalta.  Feels her mood has been stable and good on current management.  Notes she cut one tablet out because she "couldn't think as clearly."    States she takes ultram very rarely, and cuts the Robaxin pill in fourths (for her hamstring).  In fact, regarding her hamstring, she saw Dr. Alvan Dame recently and received injections in both hips.  She could tell a great difference in her hamstring after this, even before she got home.  Continues on iron tablets as prescribed.   No problems updated.   Wt Readings  from Last 3 Encounters:  04/20/18 136 lb 8 oz (61.9 kg)  04/16/18 138 lb (62.6 kg)  02/25/18 141 lb (64 kg)   BP Readings from Last 3 Encounters:  04/20/18 140/78  04/16/18 124/70  02/25/18 117/65   BMI Readings from Last 3 Encounters:  04/20/18 24.97 kg/m  04/16/18 25.24 kg/m  02/25/18 24.98 kg/m     Patient Care Team    Relationship Specialty Notifications Start End  [name], DO PCP - General Family Medicine  08/30/15   Ladene Artist, MD Referring Physician Gastroenterology  07/02/10   Myrlene Broker, MD Referring Physician Urology  07/02/10   Clent Jacks, MD Referring Physician Ophthalmology  07/02/10   [name], MD Referring Physician Cardiology  07/02/10   Danella Sensing, MD Consulting Physician Dermatology  11/09/15   Juanito Doom, MD Consulting Physician Pulmonary Disease  11/09/15   Suella Broad, MD Consulting Physician Physical Medicine and Rehabilitation  11/09/15    Comment: pain mgt :  injections and pain meds  [place],  Casimiro Needle, RPH-CPP Pharmacist Cardiology  11/19/15    Comment:  " [facility]" - med mgt of her HTN per request of Dr Alvester Chou- Cards  Alda Berthold, DO Consulting Physician Neurology  12/17/15   Harrington Challenger, [facility]  Pharmacist  07/29/16    Comment: clinical pharm-D in HTN clinic  Paralee Cancel, MD Consulting Physician Orthopedic Surgery  10/23/17    Comment: R hip replacement  Bo Merino, MD Consulting Physician Rheumatology  10/23/17   Melina Schools, MD Consulting Physician Orthopedic Surgery  04/20/18      Patient Active Problem List   Diagnosis Date Noted  . Medication refused- (any cholesterol ones) 01/20/2016    Priority: High  . Prediabetes- 6.0 12/17 01/11/2016    Priority: High  . Adjustment disorder with mixed anxiety and depressed mood 01/11/2016    Priority: High  . Asthma, mild persistent 12/19/2008    Priority: High  . Hyperlipidemia 08/12/2007    Priority: High  . Essential hypertension  08/12/2007    Priority: High  . Chronic fatigue syndrome with fibromyalgia 01/20/2016    Priority: Medium  . Depression 11/24/2015    Priority: Medium  . B12 deficiency 08/30/2015    Priority: Medium  . Iron deficiency anemia 11/03/2009    Priority: Medium  . Anxiety state 08/12/2007    Priority: Medium  . Muscle pain, fibromyalgia 08/12/2007    Priority: Medium  . Environmental and seasonal allergies 01/20/2016    Priority: Low  . Vitamin D deficiency 01/19/2016    Priority: Low  . Chronic Fatigue- 2ndary to FM and Mood d/o  06/20/2015    Priority: Low  . Memory loss     Priority: Low  . Lumbar radiculopathy, chronic     Priority: Low  . LBBB (left bundle branch block) 11/13/2010    Priority: Low  . G E R D 08/12/2007    Priority: Low  . Atypical chest pain 02/17/2018  . Primary osteoarthritis of both hands 10/21/2017  . DDD (degenerative disc disease), lumbar 10/21/2017  . History of COPD 10/01/2017  . Former smoker 10/01/2017  . Pain in joint of right shoulder 06/25/2017  . Trochanteric bursitis of right hip 06/25/2017  . Primary [name] disease without gangrene-  hands and feet 06/02/2017  . Iron deficiency anemia, unspecified 04/23/2017  . GAD (generalized anxiety disorder) 04/23/2017  . Insomnia 04/23/2017  . S/P right TH revision 09/24/2016  . Medication intolerance- statins- (make her FM much W) 01/10/2016  . Reaction, situational, acute, to stress(husband dying) 09/21/2015  . Dysuria 08/18/2015  . Herpes simplex labialis 04/03/2012  . Shingles 04/12/2011  . MIGRAINE HEADACHE 05/30/2010  . Allergic rhinitis 08/12/2007  . ARTHRITIS 08/12/2007      Past Medical History:  Diagnosis Date  . ALLERGIC RHINITIS   . ANEMIA, IRON DEFICIENCY   . ANXIETY   . ARTHRITIS    s/p bilateral THR  . ASTHMA, EXTRINSIC   . Bronchitis   . Cataracts, both eyes   . Chronic constipation   . FIBROMYALGIA    fibromyalgia  . G E R D   . High cholesterol   .  HYPERLIPIDEMIA   . HYPERTENSION   . Interstitial cystitis   . MIGRAINE HEADACHE      Past Surgical History:  Procedure Laterality Date  . ABDOMINAL HYSTERECTOMY    . ANTERIOR HIP REVISION Right 09/24/2016   Procedure: RIGHT HIP ACETABULUM AND FEMORAL HEAD REVISION WITH BONE GRAFT;  Surgeon: Paralee Cancel, MD;  Location: [facility];  Service: Orthopedics;  Laterality: Right;  . APPENDECTOMY    . BACK SURGERY    . CATARACT EXTRACTION    . COLONOSCOPY    . DOPPLER ECHOCARDIOGRAPHY    . ESOPHAGOGASTRODUODENOSCOPY ENDOSCOPY    . JOINT REPLACEMENT    . LUMBAR DISC SURGERY    . NM MYOVIEW LTD     stress test  . Ovarian cyst removed    . ROTATOR CUFF REPAIR Left 5-6 years ago   Dr Onnie Graham; [facility] center   . TOTAL HIP ARTHROPLASTY    . TOTAL SHOULDER ARTHROPLASTY       Family History  Problem Relation Age of Onset  . Arthritis Mother   . Hypertension Mother   . Heart disease Father   . Arthritis Father   . Hypertension Father   . Depression Sister   . Hyperlipidemia Sister   . Hypertension Other   . Hyperlipidemia Other      Social History   Socioeconomic History  . Marital status: Married    Spouse name: Not on file  . Number of children: Not on file  . Years of education: Not on file  . Highest education level: Not on file  Occupational History  . Not on file  Social Needs  . Financial resource strain: Not on file  . Food insecurity:    Worry: Not on file    Inability: Not on file  . Transportation needs:    Medical: Not on file    Non-medical: Not on file  Tobacco Use  . Smoking status: Former Smoker    Packs/day: 0.10    Years: 10.00    Pack years: 1.00    Types: Cigarettes    Last attempt to quit: 03/25/1968    Years since quitting: 50.1  . Smokeless tobacco: Never Used  . 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 Retired Visual merchandiser and Sexual Activity  . Alcohol use: No    Alcohol/week: 0.0 standard drinks  . Drug use: No  . Sexual  activity: Not Currently  Lifestyle  . Physical activity:    Days per week: Not on file    Minutes per session: Not on file  . Stress: Not on file  Relationships  . Social connections:    Talks on phone: Not on file    Gets together: Not on file    Attends religious service: Not on file    Active member of club or organization: Not on file    Attends meetings of clubs or organizations: Not on file    Relationship status: Not on file  . Intimate partner violence:    Fear of current or ex partner: Not on file    Emotionally abused: Not on file    Physically abused: Not on file    Forced sexual activity: Not on file  Other Topics Concern  . Not on file  Social History Narrative   Lives with husband in a 3 story home.  Has 2 children.  Retired Glass blower/designer.  Education: Scientist, water quality.     Current Meds  Medication Sig  . acetaminophen (TYLENOL) 500 MG tablet Take by mouth as needed.  Marland Kitchen albuterol (PROAIR HFA) 108 (90 Base) MCG/ACT inhaler Inhale 2 puffs into the lungs every 6 (six) hours as needed.  Marland Kitchen amLODipine (NORVASC) 5 MG tablet Take 5 mg by mouth daily.  . busPIRone (BUSPAR) 15 MG tablet TAKE 1 TABLET BY MOUTH 3 TIMES DAILY (Patient taking  differently: Take 7.5 mg by mouth 2 (two) times daily. )  . cetirizine (ZYRTEC) 10 MG tablet Take 10 mg by mouth daily.   . chlorthalidone (HYGROTON) 25 MG tablet TAKE 1/2 TABLET BY MOUTH DAILY  . Cholecalciferol (VITAMIN D3) 5000 units TABS 5,000 IU OTC vitamin D3 daily.  Marland Kitchen docusate sodium (COLACE) 100 MG capsule Take 1 capsule (100 mg total) by mouth 2 (two) times daily. (Patient taking differently: Take 100 mg by mouth as needed. )  . DULoxetine (CYMBALTA) 30 MG capsule Take 1 capsule (30 mg total) by mouth daily.  . ferrous sulfate (FERROUSUL) 325 (65 FE) MG tablet Take 1 tablet (325 mg total) by mouth 3 (three) times daily with meals. (Patient taking differently: Take 325 mg by mouth 2 (two) times daily with a meal. )  . fluticasone  (FLONASE) 50 MCG/ACT nasal spray USE 2 SPRAYS INTO EACH NOSTRIL EVERY DAY  . losartan (COZAAR) 100 MG tablet Take 1 tablet (100 mg total) by mouth daily.  . methocarbamol (ROBAXIN) 500 MG tablet as needed.  . montelukast (SINGULAIR) 10 MG tablet Take 1 tablet (10 mg total) by mouth at bedtime.  Marland Kitchen omeprazole (PRILOSEC) 20 MG capsule Take 1 capsule (20 mg total) by mouth daily.  . SYMBICORT 80-4.5 MCG/ACT inhaler INHALE 2 PUFFS BY MOUTH TWICE DAILY (Patient taking differently: as needed. )  . traMADol (ULTRAM) 50 MG tablet Take by mouth as needed.  . vitamin B-12 (CYANOCOBALAMIN) 1000 MCG tablet Take 1 tablet (1,000 mcg total) by mouth daily.    Allergies:  Allergies  Allergen Reactions  . Morphine Sulfate Other (See Comments)    Makes her hyper     Review of Systems: General:   Denies fever, chills, unexplained weight loss.  Optho/Auditory:   Denies visual changes, blurred vision/LOV Respiratory:   Denies wheeze, DOE more than baseline levels.   Cardiovascular:   Denies chest pain, palpitations, new onset peripheral edema  Gastrointestinal:   Denies nausea, vomiting, diarrhea, abd pain.  Genitourinary: Denies dysuria, freq/ urgency, flank pain or discharge from genitals.  Endocrine:     Denies hot or cold intolerance, polyuria, polydipsia. Musculoskeletal:   Denies unexplained myalgias, joint swelling, unexplained arthralgias, gait problems.  Skin:  Denies new onset rash, suspicious lesions Neurological:     Denies dizziness, unexplained weakness, numbness  Psychiatric/Behavioral:   Denies mood changes, suicidal or homicidal ideations, hallucinations    Objective:   Blood pressure 140/78, pulse 88, temperature 98.2 F (36.8 C), height 5\' 2"  (1.575 m), weight 136 lb 8 oz (61.9 kg), SpO2 99 %. Body mass index is 24.97 kg/m. General:  Well Developed, well nourished, appropriate for stated age.  Neuro:  Alert and oriented,  extra-ocular muscles intact  HEENT:  Normocephalic,  atraumatic, neck supple Skin:  no gross rash, warm, pink. Cardiac:  RRR, S1 S2 Respiratory:  ECTA B/L and A/P, Not using accessory muscles, speaking in full sentences- unlabored. Vascular:  Ext warm, no cyanosis apprec.; cap RF less 2 sec. Psych:  No HI/SI, judgement and insight good, Euthymic mood. Full Affect.

## 2018-04-21 DIAGNOSIS — M545 Low back pain: Secondary | ICD-10-CM | POA: Diagnosis not present

## 2018-04-21 LAB — CBC WITH DIFFERENTIAL/PLATELET
BASOS: 0 %
Basophils Absolute: 0 10*3/uL (ref 0.0–0.2)
EOS (ABSOLUTE): 0 10*3/uL (ref 0.0–0.4)
Eos: 0 %
Hematocrit: 43.3 % (ref 34.0–46.6)
Hemoglobin: 14.5 g/dL (ref 11.1–15.9)
IMMATURE GRANS (ABS): 0.1 10*3/uL (ref 0.0–0.1)
Immature Granulocytes: 1 %
LYMPHS ABS: 1.2 10*3/uL (ref 0.7–3.1)
Lymphs: 11 %
MCH: 30.5 pg (ref 26.6–33.0)
MCHC: 33.5 g/dL (ref 31.5–35.7)
MCV: 91 fL (ref 79–97)
MONOS ABS: 0.8 10*3/uL (ref 0.1–0.9)
Monocytes: 7 %
NEUTROS PCT: 81 %
Neutrophils Absolute: 9.3 10*3/uL — ABNORMAL HIGH (ref 1.4–7.0)
PLATELETS: 252 10*3/uL (ref 150–450)
RBC: 4.75 x10E6/uL (ref 3.77–5.28)
RDW: 12.6 % (ref 11.7–15.4)
WBC: 11.4 10*3/uL — AB (ref 3.4–10.8)

## 2018-04-21 LAB — VITAMIN B12: Vitamin B-12: 993 pg/mL (ref 232–1245)

## 2018-04-21 LAB — T3, FREE: T3, Free: 2.3 pg/mL (ref 2.0–4.4)

## 2018-04-21 LAB — TSH: TSH: 1.48 u[IU]/mL (ref 0.450–4.500)

## 2018-04-21 LAB — HEMOGLOBIN A1C
Est. average glucose Bld gHb Est-mCnc: 128 mg/dL
HEMOGLOBIN A1C: 6.1 % — AB (ref 4.8–5.6)

## 2018-04-21 LAB — VITAMIN D 25 HYDROXY (VIT D DEFICIENCY, FRACTURES): Vit D, 25-Hydroxy: 52.4 ng/mL (ref 30.0–100.0)

## 2018-04-21 LAB — T4, FREE: Free T4: 1.05 ng/dL (ref 0.82–1.77)

## 2018-04-23 DIAGNOSIS — M5136 Other intervertebral disc degeneration, lumbar region: Secondary | ICD-10-CM | POA: Diagnosis not present

## 2018-04-23 DIAGNOSIS — M9905 Segmental and somatic dysfunction of pelvic region: Secondary | ICD-10-CM | POA: Diagnosis not present

## 2018-04-23 DIAGNOSIS — M9904 Segmental and somatic dysfunction of sacral region: Secondary | ICD-10-CM | POA: Diagnosis not present

## 2018-04-23 DIAGNOSIS — M9903 Segmental and somatic dysfunction of lumbar region: Secondary | ICD-10-CM | POA: Diagnosis not present

## 2018-05-04 ENCOUNTER — Other Ambulatory Visit: Payer: Self-pay | Admitting: Family Medicine

## 2018-05-04 DIAGNOSIS — I10 Essential (primary) hypertension: Secondary | ICD-10-CM

## 2018-05-07 DIAGNOSIS — M5416 Radiculopathy, lumbar region: Secondary | ICD-10-CM | POA: Diagnosis not present

## 2018-05-07 DIAGNOSIS — M5136 Other intervertebral disc degeneration, lumbar region: Secondary | ICD-10-CM | POA: Diagnosis not present

## 2018-05-25 ENCOUNTER — Encounter: Payer: Self-pay | Admitting: Pulmonary Disease

## 2018-05-25 ENCOUNTER — Ambulatory Visit (INDEPENDENT_AMBULATORY_CARE_PROVIDER_SITE_OTHER): Payer: Medicare Other | Admitting: Pulmonary Disease

## 2018-05-25 VITALS — BP 120/62 | HR 94 | Ht 62.0 in | Wt 136.2 lb

## 2018-05-25 DIAGNOSIS — J4550 Severe persistent asthma, uncomplicated: Secondary | ICD-10-CM

## 2018-05-25 NOTE — Patient Instructions (Signed)
Severe persistent asthma with recurrent exacerbations: Continue taking Symbicort 2 puffs twice a day no matter how you feel Keep taking Singulair Practice good hand hygiene Stay active Use pro-air as needed for chest tightness wheezing or shortness of breath In the event of a flareup of symptoms: Shortness of breath, chest tightness, wheezing I would like for you to come back to our clinic.  On that visit I need you to have a CBC with differential, serum IgE (either blood test to look for evidence of allergy) and then we would need to consider whether or not you should be started on a drug called Dupixent.  In addition, we would need to take into consideration the frequency of steroid injections you are receiving for your back pain.  Follow-up with me in 3 months or sooner if needed

## 2018-05-25 NOTE — Progress Notes (Signed)
Subjective:    Patient ID: [name], female    DOB: 08-16-1933, 85 y.o.   MRN: [id number]   Synopsis: Former patient of Dr. Joya Barr who has moderate persistent asthma.   HPI Chief Complaint  Patient presents with  . Follow-up    F/U per patient. She saw [place] in January for sinus infection. Symptoms did not go away until she had a steroid injection from her orthopedic doctor. States her breathing has been ok since then.    [name] had a steroid injection about a month ago and she says that suddenly her sinus symptoms stopped.  NO problems with bronchitis or pneumonia.  She feels like her breathing is at baseline.  She has some fatigue, no albuterol use.    Past Medical History:  Diagnosis Date  . ALLERGIC RHINITIS   . ANEMIA, IRON DEFICIENCY   . ANXIETY   . ARTHRITIS    s/p bilateral THR  . ASTHMA, EXTRINSIC   . Bronchitis   . Cataracts, both eyes   . Chronic constipation   . FIBROMYALGIA    fibromyalgia  . G E R D   . High cholesterol   . HYPERLIPIDEMIA   . HYPERTENSION   . Interstitial cystitis   . MIGRAINE HEADACHE       Review of Systems  Constitutional: Negative for chills, diaphoresis, fatigue and unexpected weight change.  HENT: Negative for rhinorrhea, sinus pressure and sneezing.   Respiratory: Negative for choking, shortness of breath and wheezing.   Cardiovascular: Negative for chest pain, palpitations and leg swelling.       Objective:   Physical Exam Vitals:   05/25/18 1108  BP: 120/62  Pulse: 94  SpO2: 98%  Weight: 136 lb 3.2 oz (61.8 kg)  Height: 5\' 2"  (1.575 m)    Gen: well appearing HENT: OP clear, TM's clear, neck supple PULM: CTA B, normal percussion CV: RRR, no mgr, trace edema GI: BS+, soft, nontender Derm: no cyanosis or rash Psyche: normal mood and affect    Imaging: 11/2017 CXR images reveiweed: Hyperinflation, no infiltrate  Labs: March 2017 eosinophil count 100 cells per microliter, IgE 3 03/2018 CBC with diff  eosinophil count is 0,   PFT May 2017 pulmonary function testing ratio 86%, FEV1 2.10 L (113% predicted), FVC 2.45 (98% predicted), no change in FEV1 with bronchodilator, total lung capacity 4.57 L (93% predicted). DLCO 17.47 (76% predicted). December 19, 2017 spirometry ratio 49%, FEV1 1.1 L 66% predicted, good start, good effort acceptable by ATS criteria  Echo: 01/2018 > LVEF 55-60%  FENO December 19, 2017 exhaled nitric oxide 15 ppm     Assessment & Plan:   Severe persistent asthma without complication  Discussion: She says that her symptoms have resolved ever since she received a steroid injection about 4 to 5 weeks ago for spinal stenosis/back pain.  Right now she does not have any problems of shortness of breath.  She continues to use Symbicort 2 puffs twice a day but she says she is not needed to use pro-air at all.  No problems with bronchitis or pneumonia.  So it is unclear to me if the steroid injection was the cause of her improved symptoms or if she has just resolved spontaneously.  If she has another exacerbation of bronchitis in the next 6 months then were going to need to consider biologic therapy because of the severity and frequency of her symptoms.  Plan: Severe persistent asthma with recurrent exacerbations: Continue taking Symbicort 2  puffs twice a day no matter how you feel Keep taking Singulair Practice good hand hygiene Stay active Use pro-air as needed for chest tightness wheezing or shortness of breath In the event of a flareup of symptoms: Shortness of breath, chest tightness, wheezing I would like for you to come back to our clinic.  On that visit I need you to have a CBC with differential, serum IgE (either blood test to look for evidence of allergy) and then we would need to consider whether or not you should be started on a drug called Dupixent.  In addition, we would need to take into consideration the frequency of steroid injections you are receiving  for your back pain.  Follow-up with me in 3 months or sooner if needed   Current Outpatient Medications:  .  acetaminophen (TYLENOL) 500 MG tablet, Take by mouth as needed., Disp: , Rfl:  .  albuterol (PROAIR HFA) 108 (90 Base) MCG/ACT inhaler, Inhale 2 puffs into the lungs every 6 (six) hours as needed., Disp: 1 Inhaler, Rfl: 5 .  amLODipine (NORVASC) 5 MG tablet, Take 5 mg by mouth daily., Disp: , Rfl:  .  busPIRone (BUSPAR) 15 MG tablet, TAKE 1 TABLET BY MOUTH 3 TIMES DAILY (Patient taking differently: Take 7.5 mg by mouth 2 (two) times daily. ), Disp: 90 tablet, Rfl: 1 .  cetirizine (ZYRTEC) 10 MG tablet, Take 10 mg by mouth daily. , Disp: , Rfl:  .  chlorthalidone (HYGROTON) 25 MG tablet, TAKE 1/2 TABLET BY MOUTH DAILY, Disp: 15 tablet, Rfl: 7 .  Cholecalciferol (VITAMIN D3) 5000 units TABS, 5,000 IU OTC vitamin D3 daily., Disp: 90 tablet, Rfl: 3 .  docusate sodium (COLACE) 100 MG capsule, Take 1 capsule (100 mg total) by mouth 2 (two) times daily. (Patient taking differently: Take 100 mg by mouth as needed. ), Disp: 10 capsule, Rfl: 0 .  DULoxetine (CYMBALTA) 30 MG capsule, Take 1 capsule (30 mg total) by mouth daily., Disp: 90 capsule, Rfl: 1 .  ferrous sulfate (FERROUSUL) 325 (65 FE) MG tablet, Take 1 tablet (325 mg total) by mouth 3 (three) times daily with meals. (Patient taking differently: Take 325 mg by mouth 2 (two) times daily with a meal. ), Disp: , Rfl:  .  fluticasone (FLONASE) 50 MCG/ACT nasal spray, USE 2 SPRAYS INTO EACH NOSTRIL EVERY DAY, Disp: 48 g, Rfl: 3 .  losartan (COZAAR) 100 MG tablet, TAKE 1 TABLET BY MOUTH DAILY, Disp: 90 tablet, Rfl: 1 .  methocarbamol (ROBAXIN) 500 MG tablet, as needed., Disp: , Rfl:  .  montelukast (SINGULAIR) 10 MG tablet, Take 1 tablet (10 mg total) by mouth at bedtime., Disp: 90 tablet, Rfl: 3 .  omeprazole (PRILOSEC) 20 MG capsule, Take 1 capsule (20 mg total) by mouth daily., Disp: 90 capsule, Rfl: 1 .  SYMBICORT 80-4.5 MCG/ACT inhaler,  INHALE 2 PUFFS BY MOUTH TWICE DAILY (Patient taking differently: as needed. ), Disp: 10.2 g, Rfl: 5 .  traMADol (ULTRAM) 50 MG tablet, Take by mouth as needed., Disp: , Rfl:  .  vitamin B-12 (CYANOCOBALAMIN) 1000 MCG tablet, Take 1 tablet (1,000 mcg total) by mouth daily., Disp: 90 tablet, Rfl: 3

## 2018-05-27 ENCOUNTER — Ambulatory Visit (INDEPENDENT_AMBULATORY_CARE_PROVIDER_SITE_OTHER): Payer: Medicare Other | Admitting: Family Medicine

## 2018-05-27 ENCOUNTER — Encounter: Payer: Self-pay | Admitting: Family Medicine

## 2018-05-27 VITALS — BP 134/78 | HR 99 | Ht 62.0 in | Wt 139.0 lb

## 2018-05-27 DIAGNOSIS — M549 Dorsalgia, unspecified: Secondary | ICD-10-CM

## 2018-05-27 DIAGNOSIS — M62838 Other muscle spasm: Secondary | ICD-10-CM | POA: Diagnosis not present

## 2018-05-27 DIAGNOSIS — G8929 Other chronic pain: Secondary | ICD-10-CM | POA: Insufficient documentation

## 2018-05-27 DIAGNOSIS — R5382 Chronic fatigue, unspecified: Secondary | ICD-10-CM

## 2018-05-27 DIAGNOSIS — F4323 Adjustment disorder with mixed anxiety and depressed mood: Secondary | ICD-10-CM

## 2018-05-27 DIAGNOSIS — I10 Essential (primary) hypertension: Secondary | ICD-10-CM | POA: Diagnosis not present

## 2018-05-27 DIAGNOSIS — E559 Vitamin D deficiency, unspecified: Secondary | ICD-10-CM | POA: Diagnosis not present

## 2018-05-27 DIAGNOSIS — R7303 Prediabetes: Secondary | ICD-10-CM

## 2018-05-27 HISTORY — DX: Other muscle spasm: M62.838

## 2018-05-27 HISTORY — DX: Other chronic pain: G89.29

## 2018-05-27 NOTE — Progress Notes (Signed)
Impression and Recommendations:    1. Chronic fatigue-  secondary to fibromyalgia and mood disorder   2. Adjustment disorder with mixed anxiety and depressed mood   3. Prediabetes- 6.0 12/17   4. Vitamin D deficiency   5. Essential hypertension   6. Chronic back pain, unspecified back location, unspecified back pain laterality   7.  Muscle spasms of back due to scoliosis     1. Specialty Care & Health Maintenance - Discussed need for patient to continue to obtain management and screenings with all established specialists.   - Participated in lengthy conversation and all questions were answered.  - Educated patient at length about the critical importance of keeping health maintenance up to date.   2. Chronic Back & Hip Pain, Muscle spasms due to scoliosis - Discussion held in office today. - Advised patient to continue follow-up with Dr. Nelva Bush and Dr. Alvan Dame as established. - Continue treatment plan as managed by specialists.   3. Chronic Fatigue - secondary to fibromyalgia and mood disorder - Continue management as prescribed. - Will continue to monitor.   4. Adjustment Disorder w/ Mixed Anxiety & Depressed Mood - Managed on Cymbalta and Buspar, well controlled. - Risks and benefits of medications reviewed with patient. - Continue treatment plan as prescribed.  - Reviewed the "spokes of the wheel" of mood and health management.  Stressed the importance of ongoing prudent habits, including regular exercise, appropriate sleep hygiene, healthful dietary habits, and prayer/meditation to relax.  - Will continue to monitor.   5. Prediabetes - A1c 6.1 last check  - well controlled  - Continue prudent dietary and lifestyle modifications.  Importance of low carb, prudent diet discussed with patient in addition to regular exercise.   - Will continue to monitor.   6. Essential Hypertension - Stable at this time. - Continue treatment plan as prescribed.  See med list  below. - Patient tolerating meds well without complication.  Denies S-E    7. Asthma & Chronic Sinus Concerns - Advised patient to continue following up with Dr. Lake Bells. - Patient knows to contineue treatment plan as recommended through pulmonology.  - Advised prudent use of humidifier to help alleviate concerns of dryness.  - Encouraged patient to drink adequate amounts of water.  - Educated patient about prudent health practices during flu season and seasons of illness. - Discussed handwashing, coughing into sleeve instead of hands.    8. B12 Deficiency - Continue supplementation as prescribed. - Will continue to monitor.    9. Vitamin D Deficiency - Continue supplementation as prescribed. - Will continue to monitor.    10. Memory Concerns - Saw Dr. Posey Pronto  -  memory was evaluated by neurology-Dr. Posey Pronto just a couple of years ago since she has been seeing [name].  No issues were identified with memory.  Patient was told to follow-up only as needed  -Reminded patient of this today and again reassured her that I do not see this is a problem in her. -After discussion patient declined to see neurology again for her current concerns and to discuss further recommendations  -  In order to help avoid deterioration of memory, advised patient to take omeprazole PRN ONLY, for reflux, not daily.    Discussed how we will be obtaining additional memory screen next Medicare Wellness visit.  - Reviewed with patient that memory can be affected by mood, anger, stress.  Extensively discussed importance of appropriate mood management to help prevent memory lapses.  -  Reviewed that if patient feels her calculations are slipping, or something truly abnormal is happening, she may return to neurology for further attention.  - Advised patient to continue working toward exercising to improve her memory, and her overall mental, physical, and emotional health.    - Healthy dietary habits encouraged,  including low-carb, and high amounts of lean protein in diet.   Pt was interviewed and evaluated by me in the clinic today for 32.5+ minutes, with over 50% time spent in face to face counseling of patients various medical conditions, treatment plans of those medical conditions including medicine management and lifestyle modification, strategies to improve health and well being; and in coordination of care. SEE ABOVE TREATMENT PLAN FOR DETAILS  Gross side effects, risk and benefits, and alternatives of medications and treatment plan in general discussed with patient.  Patient is aware that all medications have potential side effects and we are unable to predict every side effect or drug-drug interaction that may occur.   Patient will call with any questions prior to using medication if they have concerns.   Expresses verbal understanding and consents to current therapy and treatment regimen.  No barriers to understanding were identified.  Red flag symptoms and signs discussed in detail.  Patient expressed understanding regarding what to do in case of emergency\urgent symptoms  Please see AVS handed out to patient at the end of our visit for further patient instructions/ counseling done pertaining to today's office visit.   Return for Follow-up Medicare wellness in 3 months.     Note:  This note was prepared with assistance of Dragon voice recognition software. Occasional wrong-word or sound-a-like substitutions may have occurred due to the inherent limitations of voice recognition software.  This document serves as a record of services personally performed by [name], DO. It was created on her behalf by [name], a trained medical scribe. The creation of this record is based on the scribe's personal observations and the provider's statements to them.   I have reviewed the above medical documentation for accuracy and completeness and I concur.  [name], DO 05/29/2018 2:28  PM       -------------------------------------------------------------------------------------------------------------------------------   Subjective:     HPI: [name] is a 85 y.o. female who presents to [place] [facility] today for issues as discussed below.  Chronic Back Concerns Saw Dr. Nelva Bush a month ago; was sent to a back specialist, Dr. Rolena Infante of [facility] after two shots did not work.  Notes they did a back X-ray, and was told she had bad scoliosis.  Notes she was told "I can't do anything for you."  Dr. Nelva Bush injected something "in a different location."  Hip Concerns Sees Dr. Alvan Dame on Friday.  Notes she had a hip injection for bursitis.  Chronic Sinus Concerns, Asthma Had a sinus infection for over a month that Dr. Lake Bells the ENT had been treating.  Notes her breathing improved after the steroid injection for her back through Dr. Nelva Bush.  "After I got that shot, everything just dried off."  Had asthma back in the fall, and Dr. Lake Bells is concerned about this and the flu/Coronavirus.  States she has not had a case of asthma in a long while.  Patient uses Symbicort daily.  Notes she only uses her albuterol as a rescue inhaler.  "I haven't used it since I got the shot in my back."  Notes she's been dry ever since then.  Blood Pressure  Denies concerns.  Mood Managed on Cymbalta and Buspar.  "The one thing bothering me most is my lack of being able to ... I don't know if it's remember, or what."  Worries she hasn't been able to learn how to use her new smart phone, and wonders if Buspar has something to do with this.  Fibromyalgia Patient notes she does not take tramadol at all.  Notes she "can take two tylenol and do better on that."  Memory Concerns Feels she hasn't lost her math, and "can usually calculate how to do something."  It's just "sometime I'm supposed to be getting the coffee out to put it in the pot, and it'll start that way,  and I wonder 'what was I coming here for?'"  States "sometimes I think I'm in too big of a hurry."  Patient did see Dr. Posey Pronto recently and had no problem with memory.  Exercise Habits Notes she does walk the dog daily, twice daily for 20 minutes.    Wt Readings from Last 3 Encounters:  05/25/18 136 lb 3.2 oz (61.8 kg)  04/20/18 136 lb 8 oz (61.9 kg)  04/16/18 138 lb (62.6 kg)   BP Readings from Last 3 Encounters:  05/25/18 120/62  04/20/18 140/78  04/16/18 124/70   Pulse Readings from Last 3 Encounters:  05/25/18 94  04/20/18 88  04/16/18 78   BMI Readings from Last 3 Encounters:  05/25/18 24.91 kg/m  04/20/18 24.97 kg/m  04/16/18 25.24 kg/m     Patient Care Team    Relationship Specialty Notifications Start End  [name], DO PCP - General Family Medicine  08/30/15   Ladene Artist, MD Referring Physician Gastroenterology  07/02/10   Myrlene Broker, MD Referring Physician Urology  07/02/10   Clent Jacks, MD Referring Physician Ophthalmology  07/02/10   [name], MD Referring Physician Cardiology  07/02/10   Danella Sensing, MD Consulting Physician Dermatology  11/09/15   Juanito Doom, MD Consulting Physician Pulmonary Disease  11/09/15   Suella Broad, MD Consulting Physician Physical Medicine and Rehabilitation  11/09/15    Comment: pain mgt :  injections and pain meds  Rockne Menghini, RPH-CPP Pharmacist Cardiology  11/19/15    Comment:  " [facility]" - med mgt of her HTN per request of Dr Alvester Chou- Cards  Alda Berthold, [place] Physician Neurology  12/17/15   Harrington Challenger, [facility]  Pharmacist  07/29/16    Comment: clinical pharm-D in HTN clinic  Paralee Cancel, MD Consulting Physician Orthopedic Surgery  10/23/17    Comment: R hip replacement  Bo Merino, MD Consulting Physician Rheumatology  10/23/17   Melina Schools, MD Consulting Physician Orthopedic Surgery  04/20/18      Patient Active Problem List   Diagnosis Date  Noted  . Medication refused- (any cholesterol ones) 01/20/2016    Priority: High  . Prediabetes- 6.0 12/17 01/11/2016    Priority: High  . Adjustment disorder with mixed anxiety and depressed mood 01/11/2016    Priority: High  . Asthma, mild persistent 12/19/2008    Priority: High  . Hyperlipidemia 08/12/2007    Priority: High  . Essential hypertension 08/12/2007    Priority: High  . Chronic fatigue syndrome with fibromyalgia 01/20/2016    Priority: Medium  . Depression 11/24/2015    Priority: Medium  . B12 deficiency 08/30/2015    Priority: Medium  . Iron deficiency anemia 11/03/2009    Priority: Medium  . Anxiety state 08/12/2007  Priority: Medium  . Muscle pain, fibromyalgia 08/12/2007    Priority: Medium  . Environmental and seasonal allergies 01/20/2016    Priority: Low  . Vitamin D deficiency 01/19/2016    Priority: Low  . Chronic Fatigue- 2ndary to FM and Mood d/o  06/20/2015    Priority: Low  . Memory loss     Priority: Low  . Lumbar radiculopathy, chronic     Priority: Low  . LBBB (left bundle branch block) 11/13/2010    Priority: Low  . G E R D 08/12/2007    Priority: Low  .  Muscle spasms of back due to scoliosis 05/27/2018  . Chronic back pain 05/27/2018  . Atypical chest pain 02/17/2018  . Primary osteoarthritis of both hands 10/21/2017  . DDD (degenerative disc disease), lumbar 10/21/2017  . History of COPD 10/01/2017  . Former smoker 10/01/2017  . Pain in joint of right shoulder 06/25/2017  . Trochanteric bursitis of right hip 06/25/2017  . Primary [name] disease without gangrene-  hands and feet 06/02/2017  . Iron deficiency anemia, unspecified 04/23/2017  . GAD (generalized anxiety disorder) 04/23/2017  . Insomnia 04/23/2017  . S/P right TH revision 09/24/2016  . Medication intolerance- statins- (make her FM much W) 01/10/2016  . Reaction, situational, acute, to stress(husband dying) 09/21/2015  . Dysuria 08/18/2015  . Herpes simplex  labialis 04/03/2012  . Shingles 04/12/2011  . MIGRAINE HEADACHE 05/30/2010  . Allergic rhinitis 08/12/2007  . ARTHRITIS 08/12/2007    Past Medical history, Surgical history, Family history, Social history, Allergies and Medications have been entered into the medical record, reviewed and changed as needed.    Current Meds  Medication Sig  . acetaminophen (TYLENOL) 500 MG tablet Take by mouth as needed.  Marland Kitchen albuterol (PROAIR HFA) 108 (90 Base) MCG/ACT inhaler Inhale 2 puffs into the lungs every 6 (six) hours as needed.  Marland Kitchen amLODipine (NORVASC) 5 MG tablet Take 5 mg by mouth daily.  . busPIRone (BUSPAR) 15 MG tablet TAKE 1 TABLET BY MOUTH 3 TIMES DAILY (Patient taking differently: Take 7.5 mg by mouth 2 (two) times daily. )  . cetirizine (ZYRTEC) 10 MG tablet Take 10 mg by mouth daily.   . chlorthalidone (HYGROTON) 25 MG tablet TAKE 1/2 TABLET BY MOUTH DAILY  . Cholecalciferol (VITAMIN D3) 5000 units TABS 5,000 IU OTC vitamin D3 daily.  Marland Kitchen docusate sodium (COLACE) 100 MG capsule Take 1 capsule (100 mg total) by mouth 2 (two) times daily. (Patient taking differently: Take 100 mg by mouth as needed. )  . DULoxetine (CYMBALTA) 30 MG capsule Take 1 capsule (30 mg total) by mouth daily.  . ferrous sulfate (FERROUSUL) 325 (65 FE) MG tablet Take 1 tablet (325 mg total) by mouth 3 (three) times daily with meals. (Patient taking differently: Take 325 mg by mouth 2 (two) times daily with a meal. )  . fluticasone (FLONASE) 50 MCG/ACT nasal spray USE 2 SPRAYS INTO EACH NOSTRIL EVERY DAY  . losartan (COZAAR) 100 MG tablet TAKE 1 TABLET BY MOUTH DAILY  . methocarbamol (ROBAXIN) 500 MG tablet as needed.  . montelukast (SINGULAIR) 10 MG tablet Take 1 tablet (10 mg total) by mouth at bedtime.  Marland Kitchen omeprazole (PRILOSEC) 20 MG capsule Take 1 capsule (20 mg total) by mouth daily.  . SYMBICORT 80-4.5 MCG/ACT inhaler INHALE 2 PUFFS BY MOUTH TWICE DAILY (Patient taking differently: as needed. )  . traMADol (ULTRAM) 50  MG tablet Take by mouth as needed.  . vitamin B-12 (CYANOCOBALAMIN) 1000 MCG  tablet Take 1 tablet (1,000 mcg total) by mouth daily.    Allergies:  Allergies  Allergen Reactions  . Morphine Sulfate Other (See Comments)    Makes her hyper     Review of Systems:  A fourteen system review of systems was performed and found to be positive as per HPI.   Objective:   There were no vitals taken for this visit. There is no height or weight on file to calculate BMI. General:  Well Developed, well nourished, appropriate for stated age.  Neuro:  Alert and oriented,  extra-ocular muscles intact  HEENT:  Normocephalic, atraumatic, neck supple, no carotid bruits appreciated  Skin:  no gross rash, warm, pink. Cardiac:  RRR, S1 S2 Respiratory:  ECTA B/L and A/P, Not using accessory muscles, speaking in full sentences- unlabored. Vascular:  Ext warm, no cyanosis apprec.; cap RF less 2 sec. Psych:  No HI/SI, judgement and insight good, Euthymic mood. Full Affect.

## 2018-05-27 NOTE — Patient Instructions (Addendum)
Last checked was your labs back end of January.  A1c was 6.1 so no need for recheck now we will recheck next time you come   It has been over a year since your last Medicare wellness exam.  Please have this confirmed by the front desk folks but please make an appointment for Medicare wellness in 3 months      How to Increase Your Level of Physical Activity  Getting regular physical activity is important for your overall health and well-being. Most people do not get enough exercise. There are easy ways to increase your level of physical activity, even if you have not been very active in the past or you are just starting out. Why is physical activity important? Physical activity has many short-term and long-term health benefits. Regular exercise can:  Help you lose weight or maintain a healthy weight.  Strengthen your muscles and bones.  Boost your mood and improve self-esteem.  Reduce your risk of certain long-term (chronic) diseases, like heart disease, cancer, and diabetes.  Help you stay capable of walking and moving around (mobile) as you age.  Prevent accidents, such as falls, as you age.  Increase life expectancy.  What are the benefits of being physically active on a regular basis? In addition to improving your physical health, being physically active on most days of the week can help you in ways that you may not expect. Benefits of regular physical activity may include:  Feeling good about your body.  Being able to move around more easily and for longer periods of time without getting tired (increased stamina).  Finding new sources of fun and enjoyment.  Meeting new people who share a common interest.  Being able to fight off illness better (enhanced immunity).  Being able to sleep better.  What can happen if I am not physically active on a regular basis? Not getting enough physical activity can lead to an unhealthy lifestyle and future health problems. This can  increase your chances of:  Becoming overweight or obese.  Becoming sick.  Developing chronic illnesses, like heart disease or diabetes.  Having mental health problems, like depression or anxiety.  Having sleep problems.  Having trouble walking or getting yourself around (reduced mobility).  Injuring yourself in a fall as you get older.  What steps can I take to be more physically active?  Check with your health care provider about how to get started. Ask your health care provider what activities are safe for you.  Start out slowly. Walking or doing some simple chair exercises is a good place to start, especially if you have not been active before or for a long time.  Try to find activities that you enjoy. You are more likely to commit to an exercise routine if it does not feel like a chore.  If you have bone or joint problems, choose low-impact exercises, like walking or swimming.  Include physical activity in your everyday routine.  Invite friends or family members to exercise with you. This also will help you commit to your workout plan.  Set goals that you can work toward.  Aim for at least 150 minutes of moderate-intensity exercise each week. Examples of moderate-intensity exercise include walking or riding a bike. Where to find more information:  Centers for Disease Control and Prevention: BowlingGrip.is  President's Council on [employer], Sports & Nutrition www.http://villegas.org/  ChooseMyPlate: WirelessM[employer]ages.dk Contact a health care provider if:  You have headaches, muscle aches, or joint pain.  You feel  dizzy or light-headed while exercising.  You faint.  You have chest pain while exercising. Summary  Exercise benefits your mind and body at any age, even if you are just starting out.  If you have a chronic illness or have not been active for a while, check with your health care provider before  increasing your physical activity.  Choose activities that are safe and enjoyable for you.Ask your health care provider what activities are safe for you.  Start slowly. Tell your health care provider if you have problems as you start to increase your activity level. This information is not intended to replace advice given to you by your health care provider. Make sure you discuss any questions you have with your health care provider. Document Released: 02/29/2016 Document Revised: 02/29/2016 Document Reviewed: 02/29/2016 Elsevier Interactive Patient Education  [employer].

## 2018-05-29 DIAGNOSIS — M7061 Trochanteric bursitis, right hip: Secondary | ICD-10-CM | POA: Diagnosis not present

## 2018-05-29 DIAGNOSIS — M7062 Trochanteric bursitis, left hip: Secondary | ICD-10-CM | POA: Diagnosis not present

## 2018-05-29 DIAGNOSIS — Z96641 Presence of right artificial hip joint: Secondary | ICD-10-CM | POA: Diagnosis not present

## 2018-05-29 DIAGNOSIS — M25551 Pain in right hip: Secondary | ICD-10-CM | POA: Diagnosis not present

## 2018-06-01 ENCOUNTER — Other Ambulatory Visit: Payer: Self-pay | Admitting: Family Medicine

## 2018-06-01 DIAGNOSIS — F4323 Adjustment disorder with mixed anxiety and depressed mood: Secondary | ICD-10-CM

## 2018-06-01 DIAGNOSIS — R5382 Chronic fatigue, unspecified: Secondary | ICD-10-CM

## 2018-06-08 ENCOUNTER — Telehealth: Payer: Self-pay | Admitting: Pulmonary Disease

## 2018-06-08 MED ORDER — AZITHROMYCIN 250 MG PO TABS: ORAL_TABLET | ORAL | 0 refills | Status: DC

## 2018-06-08 NOTE — Telephone Encounter (Signed)
Called and spoke with Patient.  [name] P, NP, recommendations given.  Patient stated understanding. Zpack prescription sent to requested pharmacy, [facility].  Nothing further at this time.

## 2018-06-08 NOTE — Telephone Encounter (Signed)
ATC x2, someone picked up phone and then hung it up. Will ATC patient later.

## 2018-06-08 NOTE — Telephone Encounter (Signed)
S : Patient stated she started feeling bad Saturday.(3 days )   Patient stated she is having a productive cough, with yellow, green formed mucus.  Patient has hoarseness, and is complaining with a sore throat, she described as "raw on the right side".  Patient has runny nose, with yellow, frothy mucus.  Patient denies travel or being in contact with anyone sick. Patient denies fever or chills.  Patient started taking OTC Mucinex as recommended by Dr Pennie Banter, as soon as symptoms occurred.  Pulmonary medical hx : Severe persistent asthma  Denies   A/P : URI  Plan

## 2018-06-08 NOTE — Telephone Encounter (Signed)
Pt is returning call. Cb is [phone].

## 2018-06-08 NOTE — Telephone Encounter (Signed)
Primary Pulmonologist: McQuaid Last office visit and with whom: 06/12/18, BQ What do we see them for (pulmonary problems): Asthma Last OV assessment/plan:   Instructions      Return in about 3 months (around 08/25/2018).  Severe persistent asthma with recurrent exacerbations: Continue taking Symbicort 2 puffs twice a day no matter how you feel Keep taking Singulair Practice good hand hygiene Stay active Use pro-air as needed for chest tightness wheezing or shortness of breath In the event of a flareup of symptoms: Shortness of breath, chest tightness, wheezing I would like for you to come back to our clinic.  On that visit I need you to have a CBC with differential, serum IgE (either blood test to look for evidence of allergy) and then we would need to consider whether or not you should be started on a drug called Dupixent.  In addition, we would need to take into consideration the frequency of steroid injections you are receiving for your back pain.  Follow-up with me in 3 months or sooner if needed        Was appointment offered to patient (explain)?  No per office protocol at this time  Reason for call:  Patient stated she started feeling bad Saturday.  Patient stated she is having a productive cough, with yellow, green formed mucus.  Patient has hoarseness, and is complaining with a sore throat, she described as "raw on the right side".  Patient has runny nose, with yellow, frothy mucus.  Patient denies travel or being in contact with anyone sick. Patient denies fever or chills.  Patient started taking OTC Mucinex as recommended by Dr Pennie Banter, as soon as symptoms occurred.   Message routed to Alvira Monday, NP

## 2018-06-08 NOTE — Telephone Encounter (Signed)
Would make sure taking flonase, zyrtec and symbicort as directed.  Continue on mucinex Twice daily    If symptoms persist can have take  Zpack #1 to have on hold if symptoms worsen with discolored mucus   Keep follow up ov and As needed     Please contact office for sooner follow up if symptoms do not improve or worsen or seek emergency care

## 2018-06-10 ENCOUNTER — Telehealth: Payer: Self-pay

## 2018-06-10 NOTE — Telephone Encounter (Signed)
COVID-19 Pre-Screening Questions:  Provider: Dr. [name]   Needs f/u  >6 WEEKS MAY F/U 2MO  . Have you been in contact with someone that was recently sick with fever/cough or confirmed to have the [place] virus?  NO  *Contact with a confirmed case should stay at home, away from confirmed patient, monitor symptoms, and reach out to PCP for e-visit/additional testing.  2. Do you have any of the following symptoms [cough, fever (100.4 or greater)], and/or shortness of breath)?  NO  *ALL PTS W/ FEVER SHOULD BE REFERRED TO PCP FOR E-VISIT* _________________________________________________  Cardiac Questionnaire:    Since your last visit or hospitalization:    1. Have you been having chest pain? NO   2. Have you been having shortness of breath? NO   3. Have you been having increasing edema, wt gain, or increase in abdominal girth (pants fitting more tightly)? NO   4. Have you had any passing out spells? NO    *A YES to any of these questions would result in the appointment being kept.  *If all the answers to these questions are NO, we should indicate that given the current situation regarding the worldwide coronarvirus pandemic, at the recommendation of the CDC, we are looking to limit gatherings in our waiting area, and thus will reschedule their appointment beyond four weeks from today.

## 2018-06-11 NOTE — Telephone Encounter (Signed)
Already has f/u appt scheduled in 07/2018 with Dr.Berry, will keep this appt and remove from C19 rescheduling pool. Please make sure pt is aware for now. Thx

## 2018-06-15 ENCOUNTER — Ambulatory Visit: Payer: Medicare Other | Admitting: Adult Health

## 2018-07-20 ENCOUNTER — Telehealth: Payer: Self-pay | Admitting: Family Medicine

## 2018-07-20 NOTE — Telephone Encounter (Signed)
Patient called to reschedule her MWE exam to June but states she needs labs @ this appt too-- I do not see Lab orders in chart for pt except for Vit-D  frm Dec 2019-- Forwarding note to medical assistant for review for missing labs.  --glh

## 2018-07-20 NOTE — Telephone Encounter (Signed)
Patient is not in need of any labs at this time.  A1C will be due in July - therefore this is something that can wait until then.  Patient notified.  MPulliam, CMA/RT(R)

## 2018-07-21 ENCOUNTER — Other Ambulatory Visit: Payer: Self-pay | Admitting: Family Medicine

## 2018-07-21 DIAGNOSIS — K219 Gastro-esophageal reflux disease without esophagitis: Secondary | ICD-10-CM

## 2018-08-11 DIAGNOSIS — M5136 Other intervertebral disc degeneration, lumbar region: Secondary | ICD-10-CM | POA: Diagnosis not present

## 2018-08-12 NOTE — Progress Notes (Deleted)
Office Visit Note  Patient: [name]             Date of Birth: [birth year]           MRN: [id number]             PCP: [name], DO Referring: [name], DO Visit Date: 08/26/2018 Occupation: @GUAROCC @  Subjective:  No chief complaint on file.   History of Present Illness: [name] is a 85 y.o. female ***   Activities of Daily Living:  Patient reports morning stiffness for *** {minute/hour:19697}.   Patient {ACTIONS;DENIES/REPORTS:21021675::"Denies"} nocturnal pain.  Difficulty dressing/grooming: {ACTIONS;DENIES/REPORTS:21021675::"Denies"} Difficulty climbing stairs: {ACTIONS;DENIES/REPORTS:21021675::"Denies"} Difficulty getting out of chair: {ACTIONS;DENIES/REPORTS:21021675::"Denies"} Difficulty using hands for taps, buttons, cutlery, and/or writing: {ACTIONS;DENIES/REPORTS:21021675::"Denies"}  No Rheumatology ROS completed.   PMFS History:  Patient Active Problem List   Diagnosis Date Noted  .  Muscle spasms of back due to scoliosis 05/27/2018  . Chronic back pain 05/27/2018  . Atypical chest pain 02/17/2018  . Primary osteoarthritis of both hands 10/21/2017  . DDD (degenerative disc disease), lumbar 10/21/2017  . History of COPD 10/01/2017  . Former smoker 10/01/2017  . Pain in joint of right shoulder 06/25/2017  . Trochanteric bursitis of right hip 06/25/2017  . Primary [name] disease without gangrene-  hands and feet 06/02/2017  . Iron deficiency anemia, unspecified 04/23/2017  . GAD (generalized anxiety disorder) 04/23/2017  . Insomnia 04/23/2017  . S/P right TH revision 09/24/2016  . Medication refused- (any cholesterol ones) 01/20/2016  . Environmental and seasonal allergies 01/20/2016  . Chronic fatigue syndrome with fibromyalgia 01/20/2016  . Vitamin D deficiency 01/19/2016  . Prediabetes- 6.0 12/17 01/11/2016  . Adjustment disorder with mixed anxiety and depressed mood 01/11/2016  . Medication intolerance- statins- (make  her FM much W) 01/10/2016  . Depression 11/24/2015  . Reaction, situational, acute, to stress(husband dying) 09/21/2015  . B12 deficiency 08/30/2015  . Dysuria 08/18/2015  . Chronic Fatigue- 2ndary to FM and Mood d/o  06/20/2015  . Memory loss   . Lumbar radiculopathy, chronic   . Herpes simplex labialis 04/03/2012  . Shingles 04/12/2011  . LBBB (left bundle branch block) 11/13/2010  . MIGRAINE HEADACHE 05/30/2010  . Iron deficiency anemia 11/03/2009  . Asthma, mild persistent 12/19/2008  . Hyperlipidemia 08/12/2007  . Anxiety state 08/12/2007  . Essential hypertension 08/12/2007  . Allergic rhinitis 08/12/2007  . G E R D 08/12/2007  . ARTHRITIS 08/12/2007  . Muscle pain, fibromyalgia 08/12/2007    Past Medical History:  Diagnosis Date  . ALLERGIC RHINITIS   . ANEMIA, IRON DEFICIENCY   . ANXIETY   . ARTHRITIS    s/p bilateral THR  . ASTHMA, EXTRINSIC   . Bronchitis   . Cataracts, both eyes   . Chronic constipation   . FIBROMYALGIA    fibromyalgia  . G E R D   . High cholesterol   . HYPERLIPIDEMIA   . HYPERTENSION   . Interstitial cystitis   . MIGRAINE HEADACHE     Family History  Problem Relation Age of Onset  . Arthritis Mother   . Hypertension Mother   . Heart disease Father   . Arthritis Father   . Hypertension Father   . Depression Sister   . Hyperlipidemia Sister   . Hypertension Other   . Hyperlipidemia Other    Past Surgical History:  Procedure Laterality Date  . ABDOMINAL HYSTERECTOMY    . ANTERIOR HIP REVISION Right 09/24/2016   Procedure: RIGHT HIP  ACETABULUM AND FEMORAL HEAD REVISION WITH BONE GRAFT;  Surgeon: Paralee Cancel, MD;  Location: [facility];  Service: Orthopedics;  Laterality: Right;  . APPENDECTOMY    . BACK SURGERY    . CATARACT EXTRACTION    . COLONOSCOPY    . DOPPLER ECHOCARDIOGRAPHY    . ESOPHAGOGASTRODUODENOSCOPY ENDOSCOPY    . JOINT REPLACEMENT    . LUMBAR DISC SURGERY    . NM MYOVIEW LTD     stress test  . Ovarian cyst  removed    . ROTATOR CUFF REPAIR Left 5-6 years ago   Dr Onnie Graham; done at [facility] surgery center   . TOTAL HIP ARTHROPLASTY    . TOTAL SHOULDER ARTHROPLASTY     Social History   Social History Narrative   Lives with husband in a 3 story home.  Has 2 children.  Retired Glass blower/designer.  Education: Scientist, water quality.   Immunization History  Administered Date(s) Administered  . Influenza Split 01/03/2012  . Influenza Whole 12/19/2008, 12/23/2009, 11/13/2010  . Influenza, High Dose Seasonal PF 12/13/2013, 01/10/2015, 01/11/2016, 01/03/2017, 12/19/2017  . Influenza,inj,Quad PF,6+ Mos 11/19/2012  . Influenza,inj,quad, With Preservative 12/23/2016, 01/26/2018  . Influenza-Unspecified 01/10/2015, 01/03/2017  . Pneumococcal Conjugate-13 01/28/2013  . Pneumococcal Polysaccharide-23 12/24/2007  . Tdap 11/13/2010     Objective: Vital Signs: There were no vitals taken for this visit.   Physical Exam   Musculoskeletal Exam: ***  CDAI Exam: CDAI Score: Not documented Patient Global Assessment: Not documented; Provider Global Assessment: Not documented Swollen: Not documented; Tender: Not documented Joint Exam   Not documented   There is currently no information documented on the homunculus. Go to the Rheumatology activity and complete the homunculus joint exam.  Investigation: No additional findings.  Imaging: No results found.  Recent Labs: Lab Results  Component Value Date   WBC 11.4 (H) 04/20/2018   HGB 14.5 04/20/2018   PLT 252 04/20/2018   NA 135 10/01/2017   K 3.6 10/01/2017   CL 95 (L) 10/01/2017   CO2 26 10/01/2017   GLUCOSE 96 10/01/2017   BUN 13 10/01/2017   CREATININE 0.76 10/01/2017   BILITOT 0.4 10/01/2017   ALKPHOS 87 11/29/2016   AST 17 10/01/2017   ALT 17 10/01/2017   PROT 7.0 10/01/2017   PROT 6.9 10/01/2017   ALBUMIN 4.7 11/29/2016   CALCIUM 9.8 10/01/2017   GFRAA 85 10/01/2017    Speciality Comments: No specialty comments available.  Procedures:  No  procedures performed Allergies: Morphine sulfate   Assessment / Plan:     Visit Diagnoses: No diagnosis found.   Orders: No orders of the defined types were placed in this encounter.  No orders of the defined types were placed in this encounter.   Face-to-face time spent with patient was *** minutes. Greater than 50% of time was spent in counseling and coordination of care.  Follow-Up Instructions: No follow-ups on file.   Earnestine Mealing, CMA  Note - This record has been created using Editor, commissioning.  Chart creation errors have been sought, but may not always  have been located. Such creation errors do not reflect on  the standard of medical care.

## 2018-08-13 ENCOUNTER — Telehealth: Payer: Self-pay | Admitting: Cardiovascular Disease

## 2018-08-13 ENCOUNTER — Ambulatory Visit: Payer: Medicare Other | Admitting: Family Medicine

## 2018-08-13 NOTE — Telephone Encounter (Signed)
Home phone/Patient was offered a virtual/ Patient wants a tele-visit, not a video, nurse was informed/ consent/ my chart/ pre reg completed

## 2018-08-18 ENCOUNTER — Telehealth (INDEPENDENT_AMBULATORY_CARE_PROVIDER_SITE_OTHER): Payer: Medicare Other | Admitting: Cardiovascular Disease

## 2018-08-18 ENCOUNTER — Telehealth: Payer: Self-pay

## 2018-08-18 DIAGNOSIS — I10 Essential (primary) hypertension: Secondary | ICD-10-CM

## 2018-08-18 DIAGNOSIS — R0789 Other chest pain: Secondary | ICD-10-CM | POA: Diagnosis not present

## 2018-08-18 DIAGNOSIS — E782 Mixed hyperlipidemia: Secondary | ICD-10-CM | POA: Diagnosis not present

## 2018-08-18 NOTE — Patient Instructions (Signed)

## 2018-08-18 NOTE — Telephone Encounter (Signed)
Patient and/or DPR-approved person aware of AVS instructions and verbalized understanding. Letter including After Visit Summary and any other necessary documents to be mailed to the patient's address on file.  

## 2018-08-18 NOTE — Progress Notes (Signed)
Virtual Visit via Telephone Note   This visit type was conducted due to national recommendations for restrictions regarding the COVID-19 Pandemic (e.g. social distancing) in an effort to limit this patient's exposure and mitigate transmission in our community.  Due to her co-morbid illnesses, this patient is at least at moderate risk for complications without adequate follow up.  This format is felt to be most appropriate for this patient at this time.  The patient did not have access to video technology/had technical difficulties with video requiring transitioning to audio format only (telephone).  All issues noted in this document were discussed and addressed.  No physical exam could be performed with this format.  Please refer to the patient's chart for her  consent to telehealth for [facility].   Date:  08/18/2018   ID:  [name], DOB 01/26/1934, MRN [id number]  Patient Location: Home Provider Location: Home  PCP:  [name], DO  Cardiologist: Dr. Quay Burow Electrophysiologist:  None   Evaluation Performed:  Follow-Up Visit  Chief Complaint: 83-month follow-up  History of Present Illness:     [name] is a 85 y.o.  mildly-overweight married Caucasian female, mother of 3 and grandmother of 1 grandchild, who works as a Engineer, manufacturing systems last saw her in the office  02/17/2018.Her husband [name] was a patient of mine as well unfortunatelyhadLuis body dementia and passed away in 09/22/22 of last year. She lives by herself with her daughter [name] who lives 2 miles away and a son who lives up in [place]..Her risk factors include type 2 diabetes, hypertension, and hyperlipidemia. She does have fibromyalgia. She has had a normal 2D echo and Myoview stress test in the past.Since I saw her a little over a year ago she unfortunately had to have a redo right total hip replacement on 09/24/2016 by Dr. Alvan Dame. She denies chest pain or shortness of  breath. She does have purplish discoloration of both feet despite normal pulses and normal Doppler studies for unclear reasons.  She saw [name]  NP in the office 01/29/2018 with atypical chest pain and asthma type symptoms.  Myoview stress test was low risk nonischemic, and echo revealed normal LV function with a anteroapical wall motion abnormality.  I did not think her symptoms were ischemically mediated.  Since I saw her in the office last November she is done well.  She denies chest pain or shortness of breath.  Her major complaints are of her back.  She is sheltering in place and socially distancing.  The patient does not have symptoms concerning for COVID-19 infection (fever, chills, cough, or new shortness of breath).    Past Medical History:  Diagnosis Date  . ALLERGIC RHINITIS   . ANEMIA, IRON DEFICIENCY   . ANXIETY   . ARTHRITIS    s/p bilateral THR  . ASTHMA, EXTRINSIC   . Bronchitis   . Cataracts, both eyes   . Chronic constipation   . FIBROMYALGIA    fibromyalgia  . G E R D   . High cholesterol   . HYPERLIPIDEMIA   . HYPERTENSION   . Interstitial cystitis   . MIGRAINE HEADACHE    Past Surgical History:  Procedure Laterality Date  . ABDOMINAL HYSTERECTOMY    . ANTERIOR HIP REVISION Right 09/24/2016   Procedure: RIGHT HIP ACETABULUM AND FEMORAL HEAD REVISION WITH BONE GRAFT;  Surgeon: Paralee Cancel, MD;  Location: [facility];  Service: Orthopedics;  Laterality: Right;  . APPENDECTOMY    .  BACK SURGERY    . CATARACT EXTRACTION    . COLONOSCOPY    . DOPPLER ECHOCARDIOGRAPHY    . ESOPHAGOGASTRODUODENOSCOPY ENDOSCOPY    . JOINT REPLACEMENT    . LUMBAR DISC SURGERY    . NM MYOVIEW LTD     stress test  . Ovarian cyst removed    . ROTATOR CUFF REPAIR Left 5-6 years ago   Dr Onnie Graham; done at [facility] center   . TOTAL HIP ARTHROPLASTY    . TOTAL SHOULDER ARTHROPLASTY       No outpatient medications have been marked as taking for the 08/18/18 encounter  (Appointment) with [name], MD.     Allergies:   Morphine sulfate   Social History   Tobacco Use  . Smoking status: Former Smoker    Packs/day: 0.10    Years: 10.00    Pack years: 1.00    Types: Cigarettes    Last attempt to quit: 03/25/1968    Years since quitting: 50.4  . Smokeless tobacco: Never Used  . 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 Retired Visual merchandiser Use Topics  . Alcohol use: No    Alcohol/week: 0.0 standard drinks  . Drug use: No     Family Hx: The patient's family history includes Arthritis in her father and mother; Depression in her sister; Heart disease in her father; Hyperlipidemia in her sister and another family member; Hypertension in her father, mother, and another family member.  ROS:   Please see the history of present illness.     All other systems reviewed and are negative.   Prior CV studies:   The following studies were reviewed today:  None  Labs/Other Tests and Data Reviewed:    EKG:  No ECG reviewed.  Recent Labs: 10/01/2017: ALT 17; BUN 13; Creat 0.76; Potassium 3.6; Sodium 135 04/20/2018: Hemoglobin 14.5; Platelets 252; TSH 1.480   Recent Lipid Panel Lab Results  Component Value Date/Time   CHOL 328 (H) 11/29/2016 11:36 AM   TRIG 158 (H) 11/29/2016 11:36 AM   HDL 66 11/29/2016 11:36 AM   CHOLHDL 5.0 (H) 11/29/2016 11:36 AM   CHOLHDL 5.5 (H) 01/11/2016 10:38 AM   LDLCALC 230 (H) 11/29/2016 11:36 AM   LDLDIRECT 195.1 04/12/2011 01:47 PM    Wt Readings from Last 3 Encounters:  05/29/18 139 lb (63 kg)  05/25/18 136 lb 3.2 oz (61.8 kg)  04/20/18 136 lb 8 oz (61.9 kg)     Objective:    Vital Signs:  There were no vitals taken for this visit.   VITAL SIGNS:  reviewed a full physical exam was not performed today since this was a virtual telemedicine phone visit  ASSESSMENT & PLAN:    1. Essential hypertension- history of essential hypertension blood pressure measured by the patient at home of 136/63.   She is on amlodipine, chlorthalidone and losartan. 2. Hyperlipidemia-history of hyperlipidemia not on statin therapy with lipid profile performed 11/29/2016 revealing LDL of 230 and HDL of 66  COVID-19 Education: The signs and symptoms of COVID-19 were discussed with the patient and how to seek care for testing (follow up with PCP or arrange E-visit).  The importance of social distancing was discussed today.  Time:   Today, I have spent 6 minutes with the patient with telehealth technology discussing the above problems.     Medication Adjustments/Labs and Tests Ordered: Current medicines are reviewed at length with the patient today.  Concerns regarding medicines are outlined above.  Tests Ordered: No orders of the defined types were placed in this encounter.   Medication Changes: No orders of the defined types were placed in this encounter.   Disposition:  Follow up in 1 year(s)  Signed, Quay Burow, MD  08/18/2018 9:16 AM    [place]

## 2018-08-21 ENCOUNTER — Other Ambulatory Visit: Payer: Self-pay | Admitting: Family Medicine

## 2018-08-21 DIAGNOSIS — F4323 Adjustment disorder with mixed anxiety and depressed mood: Secondary | ICD-10-CM

## 2018-08-21 DIAGNOSIS — R5382 Chronic fatigue, unspecified: Secondary | ICD-10-CM

## 2018-08-25 ENCOUNTER — Telehealth: Payer: Self-pay | Admitting: Pulmonary Disease

## 2018-08-25 MED ORDER — MONTELUKAST SODIUM 10 MG PO TABS: 10.0000 mg | ORAL_TABLET | Freq: Every day | ORAL | 1 refills | Status: DC

## 2018-08-25 NOTE — Telephone Encounter (Signed)
rx sent. Nothing further needed.

## 2018-08-26 ENCOUNTER — Ambulatory Visit: Payer: Self-pay | Admitting: Rheumatology

## 2018-08-31 ENCOUNTER — Ambulatory Visit: Payer: Medicare Other | Admitting: Family Medicine

## 2018-09-08 ENCOUNTER — Encounter: Payer: Self-pay | Admitting: Family Medicine

## 2018-09-08 ENCOUNTER — Other Ambulatory Visit: Payer: Self-pay

## 2018-09-08 ENCOUNTER — Ambulatory Visit (INDEPENDENT_AMBULATORY_CARE_PROVIDER_SITE_OTHER): Payer: Medicare Other | Admitting: Family Medicine

## 2018-09-08 VITALS — BP 132/68 | HR 77 | Temp 97.0°F | Ht 62.0 in | Wt 127.8 lb

## 2018-09-08 DIAGNOSIS — Z0001 Encounter for general adult medical examination with abnormal findings: Secondary | ICD-10-CM

## 2018-09-08 DIAGNOSIS — F410 Panic disorder [episodic paroxysmal anxiety] without agoraphobia: Secondary | ICD-10-CM | POA: Diagnosis not present

## 2018-09-08 DIAGNOSIS — F4323 Adjustment disorder with mixed anxiety and depressed mood: Secondary | ICD-10-CM | POA: Diagnosis not present

## 2018-09-08 DIAGNOSIS — F419 Anxiety disorder, unspecified: Secondary | ICD-10-CM | POA: Diagnosis not present

## 2018-09-08 DIAGNOSIS — F430 Acute stress reaction: Secondary | ICD-10-CM | POA: Diagnosis not present

## 2018-09-08 DIAGNOSIS — Z23 Encounter for immunization: Secondary | ICD-10-CM

## 2018-09-08 DIAGNOSIS — Z0000 Encounter for general adult medical examination without abnormal findings: Secondary | ICD-10-CM

## 2018-09-08 DIAGNOSIS — F411 Generalized anxiety disorder: Secondary | ICD-10-CM

## 2018-09-08 DIAGNOSIS — E2839 Other primary ovarian failure: Secondary | ICD-10-CM

## 2018-09-08 MED ORDER — ALPRAZOLAM 0.25 MG PO TABS: 0.2500 mg | ORAL_TABLET | Freq: Two times a day (BID) | ORAL | 0 refills | Status: DC | PRN

## 2018-09-08 MED ORDER — ZOSTER VAC RECOMB ADJUVANTED 50 MCG/0.5ML IM SUSR: 0.5000 mL | Freq: Once | INTRAMUSCULAR | 0 refills | Status: AC

## 2018-09-08 MED ORDER — BUSPIRONE HCL 7.5 MG PO TABS: 7.5000 mg | ORAL_TABLET | Freq: Three times a day (TID) | ORAL | 5 refills | Status: DC

## 2018-09-08 NOTE — Progress Notes (Signed)
Telehealth office visit note for [name], D.O- at Primary Care at [facility]   I connected with current patient today and verified that I am speaking with the correct person using two identifiers.   . Location of the patient: Home . Location of the provider: Office Only the patient (+/- their family members at pt's discretion) and myself were participating in the encounter    - This visit type was conducted due to national recommendations for restrictions regarding the COVID-19 Pandemic (e.g. social distancing) in an effort to limit this patient's exposure and mitigate transmission in our community.  This format is felt to be most appropriate for this patient at this time.   - The patient did not have access to video technology or had technical difficulties with video requiring transitioning to audio format only. - No physical exam could be performed with this format, beyond that communicated to [name] by the patient/ family members as noted.   - Additionally my office staff/ schedulers discussed with the patient that there may be a monetary charge related to this service, depending on their medical insurance.   The patient expressed understanding, and agreed to proceed.    Subjective:   [name] is a 85 y.o. female who presents for Medicare Annual (Subsequent) preventive examination.  Not interested in getting bone density- last 03/2015-was within normal limits apparently and patient was told did not need to continue them per her endocrinologist Dr. Amalia Greenhouse at the time.   Patient also does not want to go and repeat this.  -Has aged out of mammogram, colonoscopy, Pap smear etc.  -She is up-to-date on immunizations.  However she has never had a shingles/ Shingrix vaccine and we discussed this today.    Separate HPI:  --> Patient also has a second issue and understands this is separate from her Medicare wellness and will likely result in a office visit charge.  She agreed to this  and desired to proceed instead of making a follow-up on a different day.  Mood- has been worse lately.   More anxiety than usual.  She states her depression is well controlled.    at times she can get tightness in chest and feel like it is difficult to breathe.  This occurs when she is thinking about something and worrying about something and then the physical symptoms will always come on after her emotions get the best of her.   She takes Cymbalta daily as well as the buspirone but does not take the buspirone 3 times a day only twice daily.  She is currently taking 7.5 mg twice daily.   Has used xanax in past- seldomly.  Last script for xanax was 06/02/17- 30 tabs given.  She tolerated well without side effect. - She understands this is just as needed and not to be taken as a regular daily medicine.  She also understands the risks of this making her sleepy or dopey feeling, increased risk for falls and increased risk for memory problems etc.    Her son coming from [facility] to visit pt soon and this is making pt anxious as well-not so much about COVID specifically but just about his visit   Review of Systems:  A fourteen system review of systems was performed and found to be positive as per HPI.     Objective:    Vitals: BP 132/68   Pulse 77   Temp (!) 97 F (36.1 C)   Ht 5\' 2"  (1.575 m)  Wt 127 lb 12.8 oz (58 kg)   BMI 23.37 kg/m   Body mass index is 23.37 kg/m.  Advanced Directives 05/26/2017 10/29/2016 09/24/2016 09/20/2016 09/10/2016 09/07/2016 08/30/2015  Does Patient Have a Medical Advance Directive? Yes No Yes Yes Yes Yes Yes  Type of Paramedic of [place];Living will - [place];Living will [place];Living will Living will;Healthcare Power of Attorney Living will [place];Living will  Does patient want to make changes to medical advance directive? - - No - Patient declined No - Patient declined No - Patient declined  - No - Patient declined  Copy of [place] in Chart? - - Yes - Yes - -    Tobacco Social History   Tobacco Use  Smoking Status Former Smoker  . Packs/day: 0.10  . Years: 10.00  . Pack years: 1.00  . Types: Cigarettes  . Quit date: 03/25/1968  . Years since quitting: 50.4  Smokeless Tobacco Never Used  Tobacco Comment   Widowed with two children. Retired Network engineer given: Not Answered Comment: Widowed with two children. Retired Web designer    Past Medical History:  Diagnosis Date  . ALLERGIC RHINITIS   . ANEMIA, IRON DEFICIENCY   . ANXIETY   . ARTHRITIS    s/p bilateral THR  . ASTHMA, EXTRINSIC   . Bronchitis   . Cataracts, both eyes   . Chronic constipation   . FIBROMYALGIA    fibromyalgia  . G E R D   . High cholesterol   . HYPERLIPIDEMIA   . HYPERTENSION   . Interstitial cystitis   . MIGRAINE HEADACHE    Past Surgical History:  Procedure Laterality Date  . ABDOMINAL HYSTERECTOMY    . ANTERIOR HIP REVISION Right 09/24/2016   Procedure: RIGHT HIP ACETABULUM AND FEMORAL HEAD REVISION WITH BONE GRAFT;  Surgeon: Paralee Cancel, MD;  Location: [facility];  Service: Orthopedics;  Laterality: Right;  . APPENDECTOMY    . BACK SURGERY    . CATARACT EXTRACTION    . COLONOSCOPY    . DOPPLER ECHOCARDIOGRAPHY    . ESOPHAGOGASTRODUODENOSCOPY ENDOSCOPY    . JOINT REPLACEMENT    . LUMBAR DISC SURGERY    . NM MYOVIEW LTD     stress test  . Ovarian cyst removed    . ROTATOR CUFF REPAIR Left 5-6 years ago   Dr Onnie Graham; done at [facility] center   . TOTAL HIP ARTHROPLASTY    . TOTAL SHOULDER ARTHROPLASTY     Family History  Problem Relation Age of Onset  . Arthritis Mother   . Hypertension Mother   . Heart disease Father   . Arthritis Father   . Hypertension Father   . Depression Sister   . Hyperlipidemia Sister   . Hypertension Other   . Hyperlipidemia Other    Social History   Socioeconomic History  . Marital status: Married    Spouse  name: Not on file  . Number of children: Not on file  . Years of education: Not on file  . Highest education level: Not on file  Occupational History  . Not on file  Social Needs  . Financial resource strain: Not on file  . Food insecurity    Worry: Not on file    Inability: Not on file  . Transportation needs    Medical: Not on file    Non-medical: Not on file  Tobacco Use  .  Smoking status: Former Smoker    Packs/day: 0.10    Years: 10.00    Pack years: 1.00    Types: Cigarettes    Quit date: 03/25/1968    Years since quitting: 50.4  . Smokeless tobacco: Never Used  . 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 Retired Visual merchandiser and Sexual Activity  . Alcohol use: No    Alcohol/week: 0.0 standard drinks  . Drug use: No  . Sexual activity: Not Currently  Lifestyle  . Physical activity    Days per week: Not on file    Minutes per session: Not on file  . Stress: Not on file  Relationships  . Social Herbalist on phone: Not on file    Gets together: Not on file    Attends religious service: Not on file    Active member of club or organization: Not on file    Attends meetings of clubs or organizations: Not on file    Relationship status: Not on file  Other Topics Concern  . Not on file  Social History Narrative   Lives with husband in a 3 story home.  Has 2 children.  Retired Glass blower/designer.  Education: Scientist, water quality.    Outpatient Encounter Medications as of 09/08/2018  Medication Sig  . acetaminophen (TYLENOL) 500 MG tablet Take by mouth as needed.  Marland Kitchen albuterol (PROAIR HFA) 108 (90 Base) MCG/ACT inhaler Inhale 2 puffs into the lungs every 6 (six) hours as needed.  Marland Kitchen amLODipine (NORVASC) 5 MG tablet Take 5 mg by mouth daily.  . busPIRone (BUSPAR) 7.5 MG tablet Take 1 tablet (7.5 mg total) by mouth 3 (three) times daily.  . cetirizine (ZYRTEC) 10 MG tablet Take 10 mg by mouth daily.   . chlorthalidone (HYGROTON) 25 MG tablet TAKE 1/2 TABLET BY  MOUTH DAILY  . Cholecalciferol (VITAMIN D3) 5000 units TABS 5,000 IU OTC vitamin D3 daily.  Marland Kitchen docusate sodium (COLACE) 100 MG capsule Take 1 capsule (100 mg total) by mouth 2 (two) times daily. (Patient taking differently: Take 100 mg by mouth as needed. )  . DULoxetine (CYMBALTA) 30 MG capsule TAKE 1 CAPSULE BY MOUTH DAILY  . ferrous sulfate (FERROUSUL) 325 (65 FE) MG tablet Take 1 tablet (325 mg total) by mouth 3 (three) times daily with meals. (Patient taking differently: Take 325 mg by mouth 2 (two) times daily with a meal. )  . fluticasone (FLONASE) 50 MCG/ACT nasal spray USE 2 SPRAYS INTO EACH NOSTRIL EVERY DAY  . losartan (COZAAR) 100 MG tablet TAKE 1 TABLET BY MOUTH DAILY  . methocarbamol (ROBAXIN) 500 MG tablet as needed.  . montelukast (SINGULAIR) 10 MG tablet Take 1 tablet (10 mg total) by mouth at bedtime.  Marland Kitchen omeprazole (PRILOSEC) 20 MG capsule TAKE 1 CAPSULE BY MOUTH DAILY  . SYMBICORT 80-4.5 MCG/ACT inhaler INHALE 2 PUFFS BY MOUTH TWICE DAILY (Patient taking differently: as needed. )  . traMADol (ULTRAM) 50 MG tablet Take by mouth as needed.  . vitamin B-12 (CYANOCOBALAMIN) 1000 MCG tablet Take 1 tablet (1,000 mcg total) by mouth daily.  . [DISCONTINUED] busPIRone (BUSPAR) 15 MG tablet TAKE 1 TABLET BY MOUTH 3 TIMES DAILY (Patient taking differently: Take 7.5 mg by mouth 2 (two) times daily. )  . ALPRAZolam (XANAX) 0.25 MG tablet Take 1 tablet (0.25 mg total) by mouth 2 (two) times daily as needed for anxiety.  . Zoster Vaccine Adjuvanted [facility]) injection Inject 0.5 mLs into the muscle once for 1  dose.  . [DISCONTINUED] azithromycin (ZITHROMAX) 250 MG tablet Take as directed   No facility-administered encounter medications on file as of 09/08/2018.     Activities of Daily Living In your present state of health, do you have any difficulty performing the following activities: 09/08/2018 05/27/2018  Hearing? N N  Vision? N N  Difficulty concentrating or making decisions? N N   Walking or climbing stairs? N N  Dressing or bathing? N N  Doing errands, shopping? N N  Some recent data might be hidden   Activities of Daily Living In your present state of health, do you have difficulty performing the following activities?  1- Driving - no 2- Managing money - no 3- Feeding yourself - no 4- Getting from the bed to the chair - no 5- Climbing a flight of stairs - no 6- Preparing food and eating - no 7- Bathing or showering - no 8- Getting dressed - no 9- Getting to the toilet - no 10- Using the toilet - no 11- Moving around from place to place - no  Patient states that she feels safe at home  Patient Care Team: [name], DO as PCP - General (Family Medicine) Ladene Artist, MD as Referring Physician (Gastroenterology) Myrlene Broker, MD as Referring Physician (Urology) Clent Jacks, MD as Referring Physician (Ophthalmology) [name], MD as Referring Physician (Cardiology) Danella Sensing, MD as Consulting Physician (Dermatology) Juanito Doom, MD as Consulting Physician (Pulmonary Disease) Suella Broad, MD as Consulting Physician (Physical Medicine and Rehabilitation) Rockne Menghini, [place] as Pharmacist (Cardiology) Alda Berthold, DO as Consulting Physician (Neurology) Harrington Challenger, [facility] (Pharmacist) Paralee Cancel, MD as Consulting Physician (Orthopedic Surgery) Bo Merino, MD as Consulting Physician (Rheumatology) Melina Schools, MD as Consulting Physician (Orthopedic Surgery)    Assessment:   This is a routine wellness examination for Tniya.  Exercise Activities and Dietary recommendations Current Exercise Habits: Home exercise routine, Type of exercise: walking(patient is walking the dog 4 times daily and exercises for hamstring), Frequency (Times/Week): 7, Intensity: Moderate  Goals    . Attending meditation / other stress management classes     Stress reduction; learn to live with stress;  Setting boundaries for "down time"  Also eliminating any stressors than can be eliminated; Includes personnel etc. Made a list of contact       Fall Risk Fall Risk  09/08/2018 05/27/2018 04/20/2018 10/23/2017 11/29/2016  Falls in the past year? 0 0 0 No Yes  Number falls in past yr: - - - - 1  Injury with Fall? - - - - Yes  Risk for fall due to : - - - - (No Data)  Risk for fall due to: Comment - - - - Patient has had recent hip surgery - using a walker  Follow up Falls evaluation completed - Falls evaluation completed - -   Is the patient's home free of loose throw rugs in walkways, pet beds, electrical cords, etc?   yes      Grab bars in the bathroom? yes      Handrails on the stairs?   yes      Adequate lighting?   yes  Timed Get Up and Go performed: not done  Depression Screen PHQ 2/9 Scores 09/08/2018 05/27/2018 04/20/2018 01/26/2018  PHQ - 2 Score 2 2 1 1   PHQ- 9 Score 2 5 3 3     Depression screen [facility] 2/9 09/08/2018 05/27/2018 04/20/2018 01/26/2018 01/12/2018  Decreased Interest 1 1 0  1 1  Down, Depressed, Hopeless 1 1 1  0 0  PHQ - 2 Score 2 2 1 1 1   Altered sleeping 0 0 0 0 0  Tired, decreased energy 0 1 2 2 2   Change in appetite 0 0 0 0 0  Feeling bad or failure about yourself  0 0 0 0 0  Trouble concentrating 0 1 0 0 0  Moving slowly or fidgety/restless 0 1 0 0 0  Suicidal thoughts 0 0 0 0 0  PHQ-9 Score 2 5 3 3 3   Difficult doing work/chores Not difficult at all Somewhat difficult Somewhat difficult Somewhat difficult Somewhat difficult  Some recent data might be hidden   GAD 7 : Generalized Anxiety Score 09/08/2018 05/27/2018 10/23/2017 06/02/2017  Nervous, Anxious, on Edge 1 1 1 3   Control/stop worrying 1 1 3 3   Worry too much - different things 0 1 3 2   Trouble relaxing 3 1 3 3   Restless 0 1 0 0  Easily annoyed or irritable 0 1 1 2   Afraid - awful might happen 0 - 0 0  Total GAD 7 Score 5 - 11 13  Anxiety Difficulty Not difficult at all - - -     6CIT Screen 09/08/2018  11/29/2016  What Year? 0 points 0 points  What month? 0 points 0 points  What time? 0 points 0 points  Count back from 20 0 points 0 points  Months in reverse 0 points 0 points  Repeat phrase 0 points 4 points  Total Score 0 4  -     Immunization History  Administered Date(s) Administered  . Influenza Split 01/03/2012  . Influenza Whole 12/19/2008, 12/23/2009, 11/13/2010  . Influenza, High Dose Seasonal PF 12/13/2013, 01/10/2015, 01/11/2016, 01/03/2017, 12/19/2017  . Influenza,inj,Quad PF,6+ Mos 11/19/2012  . Influenza,inj,quad, With Preservative 12/23/2016, 01/26/2018  . Influenza-Unspecified 01/10/2015, 01/03/2017  . Pneumococcal Conjugate-13 01/28/2013  . Pneumococcal Polysaccharide-23 12/24/2007  . Tdap 11/13/2010    Qualifies for Shingles Vaccine?Sent to the pharamacy  Screening Tests Health Maintenance  Topic Date Due  . INFLUENZA VACCINE  10/24/2018  . TETANUS/TDAP  11/12/2020  . DEXA SCAN  Completed  . PNA vac Low Risk Adult  Completed    Cancer Screenings: Lung: Low Dose CT Chest recommended if Age 108-80 years, 30 pack-year currently smoking OR have quit w/in 15years. Patient does not qualify. Breast:  Up to date on Mammogram? Patient has aged out of screening  Up to date of Bone Density/Dexa? 2017  Order for repeat placed today Colorectal: patient has aged out   Additional Screenings: Hepatitis C Screening:      Plan:     1. Encounter for Medicare annual wellness exam  I have personally reviewed and noted the following in the patient's chart:   . Medical and social history . Use of alcohol, tobacco or illicit drugs  . Current medications and supplements . Functional ability and status . Nutritional status . Physical activity . Advanced directives . List of other physicians . Hospitalizations, surgeries, and ER visits in previous 12 months . Vitals . Screenings to include cognitive, depression, and falls . Referrals and appointments  In addition,  I have reviewed and discussed with patient certain preventive protocols, quality metrics, and best practice recommendations. A written personalized care plan for preventive services as well as general preventive health recommendations were provided to patient.  2. Need for shingles vaccine -Prescription sent into the pharmacy after risk benefits of this vaccine discussed with patient. -  Zoster Vaccine Adjuvanted [facility]) injection; Inject 0.5 mLs into the muscle once for 1 dose.  Dispense: 0.5 mL; Refill: 0  3. Estrogen deficiency Order placed.  Patient states she may or may not go. - DG Bone Density; Future    Separate A/P: 4. Anxious mood -Advised patient to take her 7.5 mg BuSpar 3 times a day not twice daily.  -She understands risk benefits of Xanax and will take sparingly.  She understands this 1 prescription will last her over a year as the others in the past have.  She also understands never to use it for sleep problems and if she feels little nervous-only for panic and physical symptoms of anxiety which she cannot control herself -She will continue her Cymbalta as well. -Prescription for BuSpar was changed in the chart however no new prescription was sent in.  She will continue to take the half tablet of the 15 mg 3 times a day until those are gone and then get the new prescription.  5. Panic attack as reaction to stress -Lifestyle modification and stress management techniques reviewed with patient.  She understands is not just about popping a pill but going for walks, doing deep breathing exercises, prayer, etc. can all be helpful in helping her and her reaction to her stress she is feeling. -Sleeping well.  No issues   [name], DO 09/08/2018 12:08 PM

## 2018-09-14 NOTE — Progress Notes (Signed)
Office Visit Note  Patient: [name]             Date of Birth: November 28, 1933           MRN: [id number]             PCP: [name], DO Referring: [name], DO Visit Date: 09/28/2018 Occupation: @GUAROCC @  Subjective:  [name] and joint pain.   History of Present Illness: [name] is a 85 y.o. female with history of positive ANA and osteoarthritis.  She states she continues to have discomfort in her hands and feet due to underlying osteoarthritis.  She is also noticed grip strength and she has difficulty holding onto the objects.  She has been experiencing some tingling on the fingertips.  She states that the ball of her foot to the toes have been causing some mild numbness as well.  Her [name] is not as severe although her hands are still red during the summertime as well.  She continues to have right hip pain despite having replacement.  He continues to have lower back pain.  She has had injections to her lower back by Dr. Governor Rooks.  She states the injections are not as effective anymore.  She has some generalized pain and it is difficult for her to afford to differentiate between osteoarthritis and fibromyalgia.  Activities of Daily Living:  Patient reports morning stiffness for 24 hours.   Patient Reports nocturnal pain.  Difficulty dressing/grooming: Denies Difficulty climbing stairs: Reports Difficulty getting out of chair: Denies Difficulty using hands for taps, buttons, cutlery, and/or writing: Reports  Review of Systems  Constitutional: Positive for fatigue. Negative for night sweats, weight gain and weight loss.  HENT: Positive for mouth dryness. Negative for mouth sores, trouble swallowing, trouble swallowing and nose dryness.   Eyes: Positive for dryness. Negative for pain, redness, itching and visual disturbance.  Respiratory: Negative for cough, shortness of breath and difficulty breathing.   Cardiovascular: Negative for chest pain,  palpitations, hypertension, irregular heartbeat and swelling in legs/feet.  Gastrointestinal: Negative for abdominal pain, blood in stool, constipation and diarrhea.  Endocrine: Negative for increased urination.  Genitourinary: Negative for painful urination, pelvic pain and vaginal dryness.  Musculoskeletal: Positive for arthralgias, joint pain and morning stiffness. Negative for joint swelling, myalgias, muscle weakness, muscle tenderness and myalgias.  Skin: Positive for color change. Negative for rash, hair loss, redness, skin tightness, ulcers and sensitivity to sunlight.  Allergic/Immunologic: Negative for susceptible to infections.  Neurological: Positive for numbness and memory loss. Negative for dizziness, light-headedness, headaches, night sweats and weakness.  Hematological: Negative for bruising/bleeding tendency and swollen glands.  Psychiatric/Behavioral: Negative for depressed mood, confusion and sleep disturbance. The patient is not nervous/anxious.     PMFS History:  Patient Active Problem List   Diagnosis Date Noted  .  Muscle spasms of back due to scoliosis 05/27/2018  . Chronic back pain 05/27/2018  . Atypical chest pain 02/17/2018  . Primary osteoarthritis of both hands 10/21/2017  . DDD (degenerative disc disease), lumbar 10/21/2017  . History of COPD 10/01/2017  . Former smoker 10/01/2017  . Pain in joint of right shoulder 06/25/2017  . Trochanteric bursitis of right hip 06/25/2017  . Primary [name] disease without gangrene-  hands and feet 06/02/2017  . Iron deficiency anemia, unspecified 04/23/2017  . GAD (generalized anxiety disorder) 04/23/2017  . Insomnia 04/23/2017  . S/P right TH revision 09/24/2016  . Medication refused- (any cholesterol ones) 01/20/2016  . Environmental and seasonal  allergies 01/20/2016  . Chronic fatigue syndrome with fibromyalgia 01/20/2016  . Vitamin D deficiency 01/19/2016  . Prediabetes- 6.0 12/17 01/11/2016  . Adjustment  disorder with mixed anxiety and depressed mood 01/11/2016  . Medication intolerance- statins- (make her FM much W) 01/10/2016  . Depression 11/24/2015  . Reaction, situational, acute, to stress(husband dying) 09/21/2015  . B12 deficiency 08/30/2015  . Dysuria 08/18/2015  . Chronic Fatigue- 2ndary to FM and Mood d/o  06/20/2015  . Memory loss   . Lumbar radiculopathy, chronic   . Herpes simplex labialis 04/03/2012  . Shingles 04/12/2011  . LBBB (left bundle branch block) 11/13/2010  . MIGRAINE HEADACHE 05/30/2010  . Iron deficiency anemia 11/03/2009  . Asthma, mild persistent 12/19/2008  . Hyperlipidemia 08/12/2007  . Anxiety state 08/12/2007  . Essential hypertension 08/12/2007  . Allergic rhinitis 08/12/2007  . G E R D 08/12/2007  . ARTHRITIS 08/12/2007  . Muscle pain, fibromyalgia 08/12/2007    Past Medical History:  Diagnosis Date  . ALLERGIC RHINITIS   . ANEMIA, IRON DEFICIENCY   . ANXIETY   . ARTHRITIS    s/p bilateral THR  . ASTHMA, EXTRINSIC   . Bronchitis   . Cataracts, both eyes   . Chronic constipation   . FIBROMYALGIA    fibromyalgia  . G E R D   . High cholesterol   . HYPERLIPIDEMIA   . HYPERTENSION   . Interstitial cystitis   . MIGRAINE HEADACHE     Family History  Problem Relation Age of Onset  . Arthritis Mother   . Hypertension Mother   . Heart disease Father   . Arthritis Father   . Hypertension Father   . Depression Sister   . Hyperlipidemia Sister   . Hypertension Other   . Hyperlipidemia Other    Past Surgical History:  Procedure Laterality Date  . ABDOMINAL HYSTERECTOMY    . ANTERIOR HIP REVISION Right 09/24/2016   Procedure: RIGHT HIP ACETABULUM AND FEMORAL HEAD REVISION WITH BONE GRAFT;  Surgeon: Paralee Cancel, MD;  Location: [facility];  Service: Orthopedics;  Laterality: Right;  . APPENDECTOMY    . BACK SURGERY    . CATARACT EXTRACTION    . COLONOSCOPY    . DOPPLER ECHOCARDIOGRAPHY    . ESOPHAGOGASTRODUODENOSCOPY ENDOSCOPY    .  JOINT REPLACEMENT    . LUMBAR DISC SURGERY    . NM MYOVIEW LTD     stress test  . Ovarian cyst removed    . ROTATOR CUFF REPAIR Left 5-6 years ago   Dr Onnie Graham; done at [facility]   . TOTAL HIP ARTHROPLASTY    . TOTAL SHOULDER ARTHROPLASTY     Social History   Social History Narrative   Lives with husband in a 3 story home.  Has 2 children.  Retired Glass blower/designer.  Education: Scientist, water quality.   Immunization History  Administered Date(s) Administered  . Influenza Split 01/03/2012  . Influenza Whole 12/19/2008, 12/23/2009, 11/13/2010  . Influenza, High Dose Seasonal PF 12/13/2013, 01/10/2015, 01/11/2016, 01/03/2017, 12/19/2017  . Influenza,inj,Quad PF,6+ Mos 11/19/2012  . Influenza,inj,quad, With Preservative 12/23/2016, 01/26/2018  . Influenza-Unspecified 01/10/2015, 01/03/2017  . Pneumococcal Conjugate-13 01/28/2013  . Pneumococcal Polysaccharide-23 12/24/2007  . Tdap 11/13/2010     Objective: Vital Signs: BP 127/69 (BP Location: Left Arm, Patient Position: Sitting, Cuff Size: Normal)   Pulse 78   Resp 12   Ht 5' 3.5" (1.613 m)   Wt 131 lb 9.6 oz (59.7 kg)   BMI 22.95 kg/m  Physical Exam Vitals signs and nursing note reviewed.  Constitutional:      Appearance: She is well-developed.  HENT:     Head: Normocephalic and atraumatic.  Eyes:     Conjunctiva/sclera: Conjunctivae normal.  Neck:     Musculoskeletal: Normal range of motion.  Cardiovascular:     Rate and Rhythm: Normal rate and regular rhythm.     Heart sounds: Normal heart sounds.  Pulmonary:     Effort: Pulmonary effort is normal.     Breath sounds: Normal breath sounds.  Abdominal:     General: Bowel sounds are normal.     Palpations: Abdomen is soft.  Lymphadenopathy:     Cervical: No cervical adenopathy.  Skin:    General: Skin is warm and dry.     Capillary Refill: Capillary refill takes less than 2 seconds.     Findings: Erythema present.     Comments: Noted over bilateral hands on  palmar aspect due to [name] phenomenon.  Neurological:     Mental Status: She is alert and oriented to person, place, and time.  Psychiatric:        Behavior: Behavior normal.      Musculoskeletal Exam: C-spine was in good range of motion.  She has some thoracic kyphosis and lumbar scoliosis.  She had discomfort range of motion of her left shoulder joint where she had rotator cuff tear before.  Elbow joints and wrist joints with good range of motion.  She had DIP and PIP thickening her hands with no synovitis.  Hip joints and knee joints with good range of motion.  She has had right total hip replacement which causes some discomfort.  She has MTP and PIP changes consistent with osteoarthritis of her feet.  No synovitis was noted on examination today.  CDAI Exam: CDAI Score: - Patient Global: -; Provider Global: - Swollen: -; Tender: - Joint Exam   No joint exam has been documented for this visit   There is currently no information documented on the homunculus. Go to the Rheumatology activity and complete the homunculus joint exam.  Investigation: No additional findings.  Imaging: No results found.  Recent Labs: Lab Results  Component Value Date   WBC 11.4 (H) 04/20/2018   HGB 14.5 04/20/2018   PLT 252 04/20/2018   NA 135 10/01/2017   K 3.6 10/01/2017   CL 95 (L) 10/01/2017   CO2 26 10/01/2017   GLUCOSE 96 10/01/2017   BUN 13 10/01/2017   CREATININE 0.76 10/01/2017   BILITOT 0.4 10/01/2017   ALKPHOS 87 11/29/2016   AST 17 10/01/2017   ALT 17 10/01/2017   PROT 7.0 10/01/2017   PROT 6.9 10/01/2017   ALBUMIN 4.7 11/29/2016   CALCIUM 9.8 10/01/2017   GFRAA 85 10/01/2017    Speciality Comments: No specialty comments available.  Procedures:  No procedures performed Allergies: Morphine sulfate   Assessment / Plan:     Visit Diagnoses: [name] disease without gangrene - History of severe [name], ANA 1: 82 NO, ENA negative, cryo negative, RF negative, pan-ANCA  negative, hepatitis B and C- all autoimmune work-up is negative -all autoimmune work-up was within normal limits in July 2019.  Patient is not having any new symptoms.  I do not see any reason to repeat her test.  She still continues to have mild [name] symptoms.  Precautions were discussed.  Primary osteoarthritis of both hands - severe -she has difficulty with grip strength.  Joint protection muscle strengthening was discussed.  A  handout on hand exercises was given.  S/P right TH revision - 1999 and 2018 -followed by orthopedics.  She has chronic pain.  History of total replacement of both hip joints -she had fairly good range of motion bilateral hips.  DDD (degenerative disc disease), lumbar - Status post surgery x2 -she continues to have chronic lower back pain.  She states she gets injections by Dr. Nelva Bush.  Fibromyalgia -he continues to have some generalized pain and discomfort.  Need for regular exercise was discussed.  Vitamin D deficiency - Plan: Last vitamin D level was normal.  She has been taking some over-the-counter supplement.  I also reviewed her last bone density from 2017 which was normal.  Bone density is pending at this time.  Other fatigue -is been sleeping better and the need for regular exercise was emphasized which should help with the fatigue as well.   Other medical conditions are listed as follows:   LBBB (left bundle branch block)  History of COPD  History of hyperlipidemia  History of gastroesophageal reflux (GERD)  Essential hypertension  History of migraine  Former smoker - Quit smoking 40 years ago.    Orders: No orders of the defined types were placed in this encounter.  No orders of the defined types were placed in this encounter.   Face-to-face time spent with patient was 25  minutes. Greater than 50% of time was spent in counseling and coordnation of care.  Follow-Up Instructions: Return in about 1 year (around 09/28/2019) for  Osteoarthritis, +ANA.   Bo Merino, MD  Note - This record has been created using Editor, commissioning.  Chart creation errors have been sought, but may not always  have been located. Such creation errors do not reflect on  the standard of medical care.

## 2018-09-16 ENCOUNTER — Other Ambulatory Visit: Payer: Self-pay | Admitting: Cardiovascular Disease

## 2018-09-28 ENCOUNTER — Ambulatory Visit (INDEPENDENT_AMBULATORY_CARE_PROVIDER_SITE_OTHER): Payer: Medicare Other | Admitting: Rheumatology

## 2018-09-28 ENCOUNTER — Encounter: Payer: Self-pay | Admitting: Rheumatology

## 2018-09-28 ENCOUNTER — Other Ambulatory Visit: Payer: Self-pay

## 2018-09-28 VITALS — BP 127/69 | HR 78 | Resp 12 | Ht 63.5 in | Wt 131.6 lb

## 2018-09-28 DIAGNOSIS — Z96649 Presence of unspecified artificial hip joint: Secondary | ICD-10-CM | POA: Diagnosis not present

## 2018-09-28 DIAGNOSIS — I7300 Raynaud's syndrome without gangrene: Secondary | ICD-10-CM

## 2018-09-28 DIAGNOSIS — E559 Vitamin D deficiency, unspecified: Secondary | ICD-10-CM | POA: Diagnosis not present

## 2018-09-28 DIAGNOSIS — Z96643 Presence of artificial hip joint, bilateral: Secondary | ICD-10-CM

## 2018-09-28 DIAGNOSIS — R5383 Other fatigue: Secondary | ICD-10-CM | POA: Diagnosis not present

## 2018-09-28 DIAGNOSIS — M797 Fibromyalgia: Secondary | ICD-10-CM | POA: Diagnosis not present

## 2018-09-28 DIAGNOSIS — M19042 Primary osteoarthritis, left hand: Secondary | ICD-10-CM

## 2018-09-28 DIAGNOSIS — M19041 Primary osteoarthritis, right hand: Secondary | ICD-10-CM

## 2018-09-28 DIAGNOSIS — M5136 Other intervertebral disc degeneration, lumbar region: Secondary | ICD-10-CM | POA: Diagnosis not present

## 2018-09-28 NOTE — Patient Instructions (Signed)
Hand Exercises °Hand exercises can be helpful for almost anyone. These exercises can strengthen the hands, improve flexibility and movement, and increase blood flow to the hands. These results can make work and daily tasks easier. Hand exercises can be especially helpful for people who have joint pain from arthritis or have nerve damage from overuse (carpal tunnel syndrome). These exercises can also help people who have injured a hand. °Exercises °Most of these hand exercises are gentle stretching and motion exercises. It is usually safe to do them often throughout the day. Warming up your hands before exercise may help to reduce stiffness. You can do this with gentle massage or by placing your hands in warm water for 10-15 minutes. °It is normal to feel some stretching, pulling, tightness, or mild discomfort as you begin new exercises. This will gradually improve. Stop an exercise right away if you feel sudden, severe pain or your pain gets worse. Ask your health care provider which exercises are best for you. °Knuckle bend or "claw" fist °1. Stand or sit with your arm, hand, and all five fingers pointed straight up. Make sure to keep your wrist straight during the exercise. °2. Gently bend your fingers down toward your palm until the tips of your fingers are touching the top of your palm. Keep your big knuckle straight and just bend the small knuckles in your fingers. °3. Hold this position for __________ seconds. °4. Straighten (extend) your fingers back to the starting position. °Repeat this exercise 5-10 times with each hand. °Full finger fist °1. Stand or sit with your arm, hand, and all five fingers pointed straight up. Make sure to keep your wrist straight during the exercise. °2. Gently bend your fingers into your palm until the tips of your fingers are touching the middle of your palm. °3. Hold this position for __________ seconds. °4. Extend your fingers back to the starting position, stretching every  joint fully. °Repeat this exercise 5-10 times with each hand. °Straight fist °1. Stand or sit with your arm, hand, and all five fingers pointed straight up. Make sure to keep your wrist straight during the exercise. °2. Gently bend your fingers at the big knuckle, where your fingers meet your hand, and the middle knuckle. Keep the knuckle at the tips of your fingers straight and try to touch the bottom of your palm. °3. Hold this position for __________ seconds. °4. Extend your fingers back to the starting position, stretching every joint fully. °Repeat this exercise 5-10 times with each hand. °Tabletop °1. Stand or sit with your arm, hand, and all five fingers pointed straight up. Make sure to keep your wrist straight during the exercise. °2. Gently bend your fingers at the big knuckle, where your fingers meet your hand, as far down as you can while keeping the small knuckles in your fingers straight. Think of forming a tabletop with your fingers. °3. Hold this position for __________ seconds. °4. Extend your fingers back to the starting position, stretching every joint fully. °Repeat this exercise 5-10 times with each hand. °Finger spread °1. Place your hand flat on a table with your palm facing down. Make sure your wrist stays straight as you do this exercise. °2. Spread your fingers and thumb apart from each other as far as you can until you feel a gentle stretch. Hold this position for __________ seconds. °3. Bring your fingers and thumb tight together again. Hold this position for __________ seconds. °Repeat this exercise 5-10 times with each hand. °  Making circles °1. Stand or sit with your arm, hand, and all five fingers pointed straight up. Make sure to keep your wrist straight during the exercise. °2. Make a circle by touching the tip of your thumb to the tip of your index finger. °3. Hold for __________ seconds. Then open your hand wide. °4. Repeat this motion with your thumb and each finger on your  hand. °Repeat this exercise 5-10 times with each hand. °Thumb motion °1. Sit with your forearm resting on a table and your wrist straight. Your thumb should be facing up toward the ceiling. Keep your fingers relaxed as you move your thumb. °2. Lift your thumb up as high as you can toward the ceiling. Hold for __________ seconds. °3. Bend your thumb across your palm as far as you can, reaching the tip of your thumb for the small finger (pinkie) side of your palm. Hold for __________ seconds. °Repeat this exercise 5-10 times with each hand. °Grip strengthening ° °1. Hold a stress ball or other soft ball in the middle of your hand. °2. Slowly increase the pressure, squeezing the ball as much as you can without causing pain. Think of bringing the tips of your fingers into the middle of your palm. All of your finger joints should bend when doing this exercise. °3. Hold your squeeze for __________ seconds, then relax. °Repeat this exercise 5-10 times with each hand. °Contact a health care provider if: °· Your hand pain or discomfort gets much worse when you do an exercise. °· Your hand pain or discomfort does not improve within 2 hours after you exercise. °If you have any of these problems, stop doing these exercises right away. Do not do them again unless your health care provider says that you can. °Get help right away if: °· You develop sudden, severe hand pain or swelling. If this happens, stop doing these exercises right away. Do not do them again unless your health care provider says that you can. °This information is not intended to replace advice given to you by your health care provider. Make sure you discuss any questions you have with your health care provider. °Document Released: 02/20/2015 Document Revised: 07/02/2018 Document Reviewed: 03/12/2018 °Elsevier Patient Education © 2020 [employer] ° °

## 2018-10-21 ENCOUNTER — Other Ambulatory Visit: Payer: Self-pay | Admitting: Family Medicine

## 2018-10-21 DIAGNOSIS — I10 Essential (primary) hypertension: Secondary | ICD-10-CM

## 2018-10-26 DIAGNOSIS — M25512 Pain in left shoulder: Secondary | ICD-10-CM | POA: Diagnosis not present

## 2018-11-10 DIAGNOSIS — M5416 Radiculopathy, lumbar region: Secondary | ICD-10-CM | POA: Diagnosis not present

## 2018-11-10 DIAGNOSIS — M5136 Other intervertebral disc degeneration, lumbar region: Secondary | ICD-10-CM | POA: Diagnosis not present

## 2018-11-17 ENCOUNTER — Other Ambulatory Visit: Payer: Self-pay | Admitting: Family Medicine

## 2018-11-17 DIAGNOSIS — R5382 Chronic fatigue, unspecified: Secondary | ICD-10-CM

## 2018-11-17 DIAGNOSIS — F4323 Adjustment disorder with mixed anxiety and depressed mood: Secondary | ICD-10-CM

## 2018-11-18 ENCOUNTER — Other Ambulatory Visit: Payer: Self-pay | Admitting: Family Medicine

## 2018-11-18 DIAGNOSIS — R5382 Chronic fatigue, unspecified: Secondary | ICD-10-CM

## 2018-11-18 DIAGNOSIS — F4323 Adjustment disorder with mixed anxiety and depressed mood: Secondary | ICD-10-CM

## 2018-11-27 DIAGNOSIS — M25551 Pain in right hip: Secondary | ICD-10-CM | POA: Diagnosis not present

## 2018-11-27 DIAGNOSIS — M7061 Trochanteric bursitis, right hip: Secondary | ICD-10-CM | POA: Diagnosis not present

## 2018-11-27 DIAGNOSIS — Z96641 Presence of right artificial hip joint: Secondary | ICD-10-CM | POA: Diagnosis not present

## 2018-11-27 DIAGNOSIS — M76899 Other specified enthesopathies of unspecified lower limb, excluding foot: Secondary | ICD-10-CM | POA: Diagnosis not present

## 2018-12-09 DIAGNOSIS — H04123 Dry eye syndrome of bilateral lacrimal glands: Secondary | ICD-10-CM | POA: Diagnosis not present

## 2018-12-09 DIAGNOSIS — H35372 Puckering of macula, left eye: Secondary | ICD-10-CM | POA: Diagnosis not present

## 2018-12-09 DIAGNOSIS — H40013 Open angle with borderline findings, low risk, bilateral: Secondary | ICD-10-CM | POA: Diagnosis not present

## 2018-12-09 DIAGNOSIS — Z961 Presence of intraocular lens: Secondary | ICD-10-CM | POA: Diagnosis not present

## 2018-12-16 ENCOUNTER — Ambulatory Visit (INDEPENDENT_AMBULATORY_CARE_PROVIDER_SITE_OTHER): Payer: Medicare Other

## 2018-12-16 ENCOUNTER — Other Ambulatory Visit: Payer: Self-pay

## 2018-12-16 DIAGNOSIS — Z23 Encounter for immunization: Secondary | ICD-10-CM | POA: Diagnosis not present

## 2018-12-16 NOTE — Progress Notes (Signed)
Pt here for influenza vaccine.  Screening questionnaire reviewed, VIS provided to patient, and any/all patient questions answered.  T. Natanya Holecek, CMA  

## 2018-12-26 ENCOUNTER — Ambulatory Visit
Admission: EM | Admit: 2018-12-26 | Discharge: 2018-12-26 | Disposition: A | Discharge status: Home / Self Care | Payer: Medicare Other | Attending: Physician Assistant | Admitting: Physician Assistant

## 2018-12-26 ENCOUNTER — Encounter: Payer: Self-pay | Admitting: Emergency Medicine

## 2018-12-26 ENCOUNTER — Other Ambulatory Visit: Payer: Self-pay

## 2018-12-26 DIAGNOSIS — R05 Cough: Secondary | ICD-10-CM | POA: Diagnosis not present

## 2018-12-26 DIAGNOSIS — R059 Cough, unspecified: Secondary | ICD-10-CM

## 2018-12-26 DIAGNOSIS — Z1159 Encounter for screening for other viral diseases: Secondary | ICD-10-CM | POA: Diagnosis not present

## 2018-12-26 DIAGNOSIS — J4531 Mild persistent asthma with (acute) exacerbation: Secondary | ICD-10-CM | POA: Diagnosis not present

## 2018-12-26 MED ORDER — BUDESONIDE-FORMOTEROL FUMARATE 80-4.5 MCG/ACT IN AERO: 2.0000 | INHALATION_SPRAY | Freq: Two times a day (BID) | RESPIRATORY_TRACT | 0 refills | Status: DC

## 2018-12-26 MED ORDER — PREDNISONE 50 MG PO TABS: 50.0000 mg | ORAL_TABLET | Freq: Every day | ORAL | 0 refills | Status: DC

## 2018-12-26 NOTE — Discharge Instructions (Signed)
As discussed, cannot rule out COVID. Currently, no alarming signs. Testing ordered. I would like you to quarantine until testing results. Continue albuterol and symbicort. If needed, can fill prednisone for exacerbation. If experiencing shortness of breath, trouble breathing, call 911 and provide them with your current situation. Otherwise, follow up with pulmonologist as scheduled.

## 2018-12-26 NOTE — ED Triage Notes (Signed)
PT has his of asthma. Cough and chest tightness associated with cough that started Thursday. Inhalers help relieve chest tightness. PT's pulmonologist will see her Wednesday.

## 2018-12-26 NOTE — ED Provider Notes (Signed)
[facility]    CSN: [id number] Arrival date & time: 12/26/18  [id number]      History   Chief Complaint Chief Complaint  Patient presents with  . Cough    HPI [name] is a 85 y.o. female.   85 year old female with history of asthma comes in for 3 day history of possible exacerbation. Patient states this can happen during weather change, no other obvious trigger at this time. Has cough, chest tightness, nasal congestion. Denies fever, chills. Has body aches at baseline without changes. Denies abdominal pain, nausea, vomiting, diarrhea. Has wheezing/shortness of breath that is relieved by albuterol inhaler. Denies loss of taste/smell. States lives alone with minimal outside contact, with minimal worries for COVID. She has an appointment with pulmonologist in 4 days. However, running low on Symbicort and came in for evaluation.   Patient states has been using albuterol BID with good relief. She has not been using symbicort as a maintenance inhaler, but restarted since symptom onset.      Past Medical History:  Diagnosis Date  . ALLERGIC RHINITIS   . ANEMIA, IRON DEFICIENCY   . ANXIETY   . ARTHRITIS    s/p bilateral THR  . ASTHMA, EXTRINSIC   . Bronchitis   . Cataracts, both eyes   . Chronic constipation   . FIBROMYALGIA    fibromyalgia  . G E R D   . High cholesterol   . HYPERLIPIDEMIA   . HYPERTENSION   . Interstitial cystitis   . MIGRAINE HEADACHE     Patient Active Problem List   Diagnosis Date Noted  .  Muscle spasms of back due to scoliosis 05/27/2018  . Chronic back pain 05/27/2018  . Atypical chest pain 02/17/2018  . Primary osteoarthritis of both hands 10/21/2017  . DDD (degenerative disc disease), lumbar 10/21/2017  . History of COPD 10/01/2017  . Former smoker 10/01/2017  . Pain in joint of right shoulder 06/25/2017  . Trochanteric bursitis of right hip 06/25/2017  . Primary [name] disease without gangrene-  hands and feet 06/02/2017   . Iron deficiency anemia, unspecified 04/23/2017  . GAD (generalized anxiety disorder) 04/23/2017  . Insomnia 04/23/2017  . S/P right TH revision 09/24/2016  . Medication refused- (any cholesterol ones) 01/20/2016  . Environmental and seasonal allergies 01/20/2016  . Chronic fatigue syndrome with fibromyalgia 01/20/2016  . Vitamin D deficiency 01/19/2016  . Prediabetes- 6.0 12/17 01/11/2016  . Adjustment disorder with mixed anxiety and depressed mood 01/11/2016  . Medication intolerance- statins- (make her FM much W) 01/10/2016  . Depression 11/24/2015  . Reaction, situational, acute, to stress(husband dying) 09/21/2015  . B12 deficiency 08/30/2015  . Dysuria 08/18/2015  . Chronic Fatigue- 2ndary to FM and Mood d/o  06/20/2015  . Memory loss   . Lumbar radiculopathy, chronic   . Herpes simplex labialis 04/03/2012  . Shingles 04/12/2011  . LBBB (left bundle branch block) 11/13/2010  . MIGRAINE HEADACHE 05/30/2010  . Iron deficiency anemia 11/03/2009  . Asthma, mild persistent 12/19/2008  . Hyperlipidemia 08/12/2007  . Anxiety state 08/12/2007  . Essential hypertension 08/12/2007  . Allergic rhinitis 08/12/2007  . G E R D 08/12/2007  . ARTHRITIS 08/12/2007  . Muscle pain, fibromyalgia 08/12/2007    Past Surgical History:  Procedure Laterality Date  . ABDOMINAL HYSTERECTOMY    . ANTERIOR HIP REVISION Right 09/24/2016   Procedure: RIGHT HIP ACETABULUM AND FEMORAL HEAD REVISION WITH BONE GRAFT;  Surgeon: Paralee Cancel, MD;  Location: [facility];  Service: Orthopedics;  Laterality: Right;  . APPENDECTOMY    . BACK SURGERY    . CATARACT EXTRACTION    . COLONOSCOPY    . DOPPLER ECHOCARDIOGRAPHY    . ESOPHAGOGASTRODUODENOSCOPY ENDOSCOPY    . JOINT REPLACEMENT    . LUMBAR DISC SURGERY    . NM MYOVIEW LTD     stress test  . Ovarian cyst removed    . ROTATOR CUFF REPAIR Left 5-6 years ago   Dr Onnie Graham; done at [facility] center   . TOTAL HIP ARTHROPLASTY    . TOTAL SHOULDER  ARTHROPLASTY      OB History   No obstetric history on file.      Home Medications    Prior to Admission medications   Medication Sig Start Date End Date Taking? Authorizing Provider  albuterol (PROAIR HFA) 108 (90 Base) MCG/ACT inhaler Inhale 2 puffs into the lungs every 6 (six) hours as needed. 11/20/17  Yes Fenton Foy, NP  amLODipine (NORVASC) 5 MG tablet Take 5 mg by mouth daily.   Yes [provider]  busPIRone (BUSPAR) 7.5 MG tablet Take 1 tablet (7.5 mg total) by mouth 3 (three) times daily. 09/08/18  Yes Opalski, Neoma Laming, DO  cetirizine (ZYRTEC) 10 MG tablet Take 10 mg by mouth daily.  03/13/12  Yes Elsie Stain, MD  chlorthalidone (HYGROTON) 25 MG tablet TAKE 1/2 TABLET BY MOUTH DAILY 09/16/18  Yes [name], MD  Cholecalciferol (VITAMIN D3) 5000 units TABS 5,000 IU OTC vitamin D3 daily. 01/19/16  Yes Opalski, Neoma Laming, DO  DULoxetine (CYMBALTA) 30 MG capsule TAKE 1 CAPSULE BY MOUTH DAILY 11/17/18  Yes Opalski, Neoma Laming, DO  ferrous sulfate (FERROUSUL) 325 (65 FE) MG tablet Take 1 tablet (325 mg total) by mouth 3 (three) times daily with meals. Patient taking differently: Take 325 mg by mouth 2 (two) times daily with a meal.  09/24/16  Yes Babish, Rodman Key, PA-C  fluticasone [facility]) 50 MCG/ACT nasal spray USE 2 SPRAYS INTO EACH NOSTRIL EVERY DAY 06/04/13  Yes Rowe Clack, MD  losartan (COZAAR) 100 MG tablet TAKE 1 TABLET BY MOUTH DAILY 10/21/18  Yes Opalski, Deborah, DO  montelukast (SINGULAIR) 10 MG tablet Take 1 tablet (10 mg total) by mouth at bedtime. 08/25/18  Yes Juanito Doom, MD  omeprazole (PRILOSEC) 20 MG capsule TAKE 1 CAPSULE BY MOUTH DAILY 07/21/18  Yes Opalski, Neoma Laming, DO  traMADol (ULTRAM) 50 MG tablet Take by mouth as needed.   Yes [provider]  vitamin B-12 (CYANOCOBALAMIN) 1000 MCG tablet Take 1 tablet (1,000 mcg total) by mouth daily. 01/21/17  Yes Opalski, Deborah, DO  acetaminophen (TYLENOL) 500 MG tablet Take by  mouth as needed.    [provider]  ALPRAZolam Duanne Moron) 0.25 MG tablet Take 1 tablet (0.25 mg total) by mouth 2 (two) times daily as needed for anxiety. 09/08/18 09/08/19  Opalski, Neoma Laming, DO  budesonide-formoterol (SYMBICORT) 80-4.5 MCG/ACT inhaler Inhale 2 puffs into the lungs 2 (two) times daily. 12/26/18   Tasia Catchings, Amy V, PA-C  docusate sodium (COLACE) 100 MG capsule Take 1 capsule (100 mg total) by mouth 2 (two) times daily. Patient taking differently: Take 100 mg by mouth as needed.  09/24/16   Danae Orleans, PA-C  methocarbamol (ROBAXIN) 500 MG tablet as needed.    [provider]  predniSONE (DELTASONE) 50 MG tablet Take 1 tablet (50 mg total) by mouth daily with breakfast. 12/26/18   Ok Edwards, PA-C  Family History Family History  Problem Relation Age of Onset  . Arthritis Mother   . Hypertension Mother   . Heart disease Father   . Arthritis Father   . Hypertension Father   . Depression Sister   . Hyperlipidemia Sister   . Hypertension Other   . Hyperlipidemia Other     Social History Social History   Tobacco Use  . Smoking status: Former Smoker    Packs/day: 0.10    Years: 10.00    Pack years: 1.00    Types: Cigarettes    Quit date: 03/25/1968    Years since quitting: 50.7  . Smokeless tobacco: Never Used  . 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 Retired Visual merchandiser Use Topics  . Alcohol use: No    Alcohol/week: 0.0 standard drinks  . Drug use: No     Allergies   Morphine sulfate   Review of Systems Review of Systems  Reason unable to perform ROS: See HPI as above.     Physical Exam Triage Vital Signs ED Triage Vitals  Enc Vitals Group     BP 12/26/18 1008 (!) 147/78     Pulse Rate 12/26/18 1008 78     Resp 12/26/18 1008 18     Temp 12/26/18 1008 98 F (36.7 C)     Temp Source 12/26/18 1008 Oral     SpO2 12/26/18 1008 96 %     Weight --      Height --      Head Circumference --      Peak Flow --      Pain Score  12/26/18 1005 0     Pain Loc --      Pain Edu? --      Excl. in [place]? --    No data found.  Updated Vital Signs BP (!) 147/78   Pulse 78   Temp 98 F (36.7 C) (Oral)   Resp 18   SpO2 96%   Physical Exam Constitutional:      General: She is not in acute distress.    Appearance: Normal appearance. She is not ill-appearing, toxic-appearing or diaphoretic.  HENT:     Head: Normocephalic and atraumatic.     Mouth/Throat:     Mouth: Mucous membranes are moist.     Pharynx: Oropharynx is clear. Uvula midline.  Neck:     Musculoskeletal: Normal range of motion and neck supple.  Cardiovascular:     Rate and Rhythm: Normal rate and regular rhythm.     Heart sounds: Normal heart sounds. No murmur. No friction rub. No gallop.   Pulmonary:     Effort: Pulmonary effort is normal. No accessory muscle usage, prolonged expiration, respiratory distress or retractions.     Comments: Speaking in full sentences without difficulty. Lungs clear to auscultation without adventitious lung sounds. Skin:    General: Skin is warm and dry.  Neurological:     General: No focal deficit present.     Mental Status: She is alert and oriented to person, place, and time.      UC Treatments / Results  Labs (all labs ordered are listed, but only abnormal results are displayed) Labs Reviewed  NOVEL CORONAVIRUS, NAA    EKG   Radiology No results found.  Procedures Procedures (including critical care time)  Medications Ordered in UC Medications - No data to display  Initial Impression / Assessment and Plan / UC Course  I have reviewed the triage vital signs  and the nursing notes.  Pertinent labs & imaging results that were available during my care of the patient were reviewed by me and considered in my medical decision making (see chart for details).    After discussion, patient opted to do COVID testing as well, testing sent. Otherwise, currently patient's LCTAB, she is able to speak in full  sentences, symptoms relieve with albuterol. Will refill symbicort and have patient continue as directed. If needed, rx of prednisone sent to pharmacy. Otherwise, to follow up with pulmonologist as scheduled. Return precautions given. Patient expresses understanding and agrees to plan.  Final Clinical Impressions(s) / UC Diagnoses   Final diagnoses:  Cough  Mild persistent asthma with acute exacerbation   ED Prescriptions    Medication Sig Dispense Auth. Provider   predniSONE (DELTASONE) 50 MG tablet Take 1 tablet (50 mg total) by mouth daily with breakfast. 5 tablet Yu, Amy V, PA-C   budesonide-formoterol (SYMBICORT) 80-4.5 MCG/ACT inhaler Inhale 2 puffs into the lungs 2 (two) times daily. 10.2 g Ok Edwards, PA-C     PDMP not reviewed this encounter.   Ok Edwards, PA-C 12/26/18 1143

## 2018-12-27 LAB — NOVEL CORONAVIRUS, NAA: SARS-CoV-2, NAA: NOT DETECTED

## 2018-12-28 ENCOUNTER — Telehealth: Payer: Self-pay | Admitting: Internal Medicine

## 2018-12-28 ENCOUNTER — Encounter (HOSPITAL_COMMUNITY): Payer: Self-pay

## 2018-12-28 NOTE — Telephone Encounter (Signed)
Please have the patient  call the provider ( Urgent Care) who prescribed the medication .  Please offer a visit tomorrow  Make sure she knows to go the the ED for any worsening of symptoms. Please contact office for sooner follow up if symptoms do not improve or worsen or seek emergency care

## 2018-12-28 NOTE — Telephone Encounter (Signed)
Called spoke with patient. She said she went to walk in urgent care on Saturday morning. They prescribed her prednisone for dx of bronchitis. They prescribed her 50mg  tablets to take for 5 days. Patient states she has never taken this way before. She started having palpitations this morning and chest tightness which she believes the chest tightness is from stress. Patient also started back on her symbicort today. She called drug store and they told her just to stop the prednisone. Patient doesn't know what she should do.  Primary Pulmonologist: BQ seeing Erskine Emery Thursday  Last office visit and with whom: 05/25/18 BQ What do we see them for (pulmonary problems): asthma  Reason for call: see above.   SG please advise

## 2018-12-28 NOTE — Telephone Encounter (Signed)
Called and spoke with patient. She states she took a 1/2 of her xanax she has for emergency panic episodes, and she feels so much better. She is going to call urgent care, and she will head to the nearest emergency rooms if symptoms worsen.  mychart visit scheduled for patient with Aaron Edelman at [phone]

## 2018-12-29 ENCOUNTER — Telehealth (INDEPENDENT_AMBULATORY_CARE_PROVIDER_SITE_OTHER): Payer: Medicare Other | Admitting: Pulmonary Disease

## 2018-12-29 ENCOUNTER — Encounter: Payer: Self-pay | Admitting: Pulmonary Disease

## 2018-12-29 DIAGNOSIS — Z79899 Other long term (current) drug therapy: Secondary | ICD-10-CM | POA: Diagnosis not present

## 2018-12-29 DIAGNOSIS — F411 Generalized anxiety disorder: Secondary | ICD-10-CM

## 2018-12-29 DIAGNOSIS — J4531 Mild persistent asthma with (acute) exacerbation: Secondary | ICD-10-CM

## 2018-12-29 DIAGNOSIS — J3089 Other allergic rhinitis: Secondary | ICD-10-CM | POA: Diagnosis not present

## 2018-12-29 NOTE — Progress Notes (Signed)
Synopsis: Longstanding patient of Dr. Lake Bells managed here for asthma  Subjective:   PATIENT ID: [name] GENDER: female DOB: [birth year], MRN: [id number]  Chief Complaint  Patient presents with  . Follow-up    Former patient of BQ. She reports she recently felt like she was having an exaacerbation, went to urgent care and was given prednisone which she thinks may be causing her palpitations. Using albuterol prn and symbicort daily.     HPI Here for asthma, recurrent sinus congestion complicated by anxiety disorder.  Transferring care from Dr. Lake Bells - Regimen consists of symbicort 80/4.5 BID, singulair, omeprazole, flonase, zyrtec  IgE 3 2017, 4 2009 Eos not elevated on any checks I can see  Having issues over past several weeks with chest congestion after her home inhalers ran out.  This congestion is a tightness over right chest relieved with rest and/or albuterol.  No wheezing. Cough is dry, worse in AM.  Denies GERD or postnasal drip. Rx steroids +restarted home albuterol and symbicort.  Now having what sounds like panic attacks after taking all three together- palptiations, worsening dyspnea.  This was relieved with xanax but she was told by PCP that xanax should be used extremely sparingly.   Past Medical History:  Diagnosis Date  . ALLERGIC RHINITIS   . ANEMIA, IRON DEFICIENCY   . ANXIETY   . ARTHRITIS    s/p bilateral THR  . ASTHMA, EXTRINSIC   . Bronchitis   . Cataracts, both eyes   . Chronic constipation   . FIBROMYALGIA    fibromyalgia  . G E R D   . High cholesterol   . HYPERLIPIDEMIA   . HYPERTENSION   . Interstitial cystitis   . MIGRAINE HEADACHE      Family History  Problem Relation Age of Onset  . Arthritis Mother   . Hypertension Mother   . Heart disease Father   . Arthritis Father   . Hypertension Father   . Depression Sister   . Hyperlipidemia Sister   . Hypertension Other   . Hyperlipidemia Other      Past Surgical History:   Procedure Laterality Date  . ABDOMINAL HYSTERECTOMY    . ANTERIOR HIP REVISION Right 09/24/2016   Procedure: RIGHT HIP ACETABULUM AND FEMORAL HEAD REVISION WITH BONE GRAFT;  Surgeon: Paralee Cancel, MD;  Location: [facility];  Service: Orthopedics;  Laterality: Right;  . APPENDECTOMY    . BACK SURGERY    . CATARACT EXTRACTION    . COLONOSCOPY    . DOPPLER ECHOCARDIOGRAPHY    . ESOPHAGOGASTRODUODENOSCOPY ENDOSCOPY    . JOINT REPLACEMENT    . LUMBAR DISC SURGERY    . NM MYOVIEW LTD     stress test  . Ovarian cyst removed    . ROTATOR CUFF REPAIR Left 5-6 years ago   Dr Onnie Graham; done at [facility] surgery center   . TOTAL HIP ARTHROPLASTY    . TOTAL SHOULDER ARTHROPLASTY      Social History   Socioeconomic History  . Marital status: Widowed    Spouse name: Not on file  . Number of children: Not on file  . Years of education: Not on file  . Highest education level: Not on file  Occupational History  . Not on file  Social Needs  . Financial resource strain: Not on file  . Food insecurity    Worry: Not on file    Inability: Not on file  . Transportation needs    Medical: Not  on file    Non-medical: Not on file  Tobacco Use  . Smoking status: Former Smoker    Packs/day: 0.10    Years: 10.00    Pack years: 1.00    Types: Cigarettes    Quit date: 03/25/1968    Years since quitting: 50.8  . Smokeless tobacco: Never Used  . 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 Retired Visual merchandiser and Sexual Activity  . Alcohol use: No    Alcohol/week: 0.0 standard drinks  . Drug use: No  . Sexual activity: Not Currently  Lifestyle  . Physical activity    Days per week: Not on file    Minutes per session: Not on file  . Stress: Not on file  Relationships  . Social Herbalist on phone: Not on file    Gets together: Not on file    Attends religious service: Not on file    Active member of club or organization: Not on file    Attends meetings of clubs or organizations: Not on  file    Relationship status: Not on file  . Intimate partner violence    Fear of current or ex partner: Not on file    Emotionally abused: Not on file    Physically abused: Not on file    Forced sexual activity: Not on file  Other Topics Concern  . Not on file  Social History Narrative   Lives with husband in a 3 story home.  Has 2 children.  Retired Glass blower/designer.  Education: Scientist, water quality.     Allergies  Allergen Reactions  . Morphine Sulfate Other (See Comments)    Makes her hyper     Outpatient Medications Prior to Visit  Medication Sig Dispense Refill  . acetaminophen (TYLENOL) 500 MG tablet Take by mouth as needed.    Marland Kitchen albuterol (PROAIR HFA) 108 (90 Base) MCG/ACT inhaler Inhale 2 puffs into the lungs every 6 (six) hours as needed. 1 Inhaler 5  . amLODipine (NORVASC) 5 MG tablet Take 5 mg by mouth daily.    . budesonide-formoterol (SYMBICORT) 80-4.5 MCG/ACT inhaler Inhale 2 puffs into the lungs 2 (two) times daily. 10.2 g 0  . busPIRone (BUSPAR) 7.5 MG tablet Take 1 tablet (7.5 mg total) by mouth 3 (three) times daily. 90 tablet 5  . cetirizine (ZYRTEC) 10 MG tablet Take 10 mg by mouth daily.     . chlorthalidone (HYGROTON) 25 MG tablet TAKE 1/2 TABLET BY MOUTH DAILY 45 tablet 3  . Cholecalciferol (VITAMIN D3) 5000 units TABS 5,000 IU OTC vitamin D3 daily. 90 tablet 3  . docusate sodium (COLACE) 100 MG capsule Take 1 capsule (100 mg total) by mouth 2 (two) times daily. (Patient taking differently: Take 100 mg by mouth as needed. ) 10 capsule 0  . DULoxetine (CYMBALTA) 30 MG capsule TAKE 1 CAPSULE BY MOUTH DAILY 90 capsule 0  . ferrous sulfate (FERROUSUL) 325 (65 FE) MG tablet Take 1 tablet (325 mg total) by mouth 3 (three) times daily with meals. (Patient taking differently: Take 325 mg by mouth 2 (two) times daily with a meal. )    . fluticasone (FLONASE) 50 MCG/ACT nasal spray USE 2 SPRAYS INTO EACH NOSTRIL EVERY DAY 48 g 3  . losartan (COZAAR) 100 MG tablet TAKE 1 TABLET  BY MOUTH DAILY 90 tablet 1  . methocarbamol (ROBAXIN) 500 MG tablet as needed.    . montelukast (SINGULAIR) 10 MG tablet Take 1 tablet (10 mg  total) by mouth at bedtime. 90 tablet 1  . omeprazole (PRILOSEC) 20 MG capsule TAKE 1 CAPSULE BY MOUTH DAILY 90 capsule 1  . traMADol (ULTRAM) 50 MG tablet Take by mouth as needed.    . vitamin B-12 (CYANOCOBALAMIN) 1000 MCG tablet Take 1 tablet (1,000 mcg total) by mouth daily. 90 tablet 3  . ALPRAZolam (XANAX) 0.25 MG tablet Take 1 tablet (0.25 mg total) by mouth 2 (two) times daily as needed for anxiety. 30 tablet 0  . predniSONE (DELTASONE) 50 MG tablet Take 1 tablet (50 mg total) by mouth daily with breakfast. 5 tablet 0   No facility-administered medications prior to visit.    ROS No fevers, chills, rashes, N/V/D, hemoptysis, weight changes   Objective:  GEN: elderly woman in NAD HEENT: mask on CV: RRR, ext warm PULM: completely clear in all lung fields GI: Soft, +BS EXT: no edema NEURO: moves all 4 ext to command PSYCH: AOx3, anxious SKIN: No rashes    Vitals:   12/31/18 1041  BP: 140/76  Pulse: 82  Temp: (!) 97.2 F (36.2 C)  TempSrc: Temporal  SpO2: 97%  Weight: 131 lb 9.6 oz (59.7 kg)  Height: 5\' 3"  (1.6 m)   97% on RA BMI Readings from Last 3 Encounters:  12/31/18 23.31 kg/m  09/28/18 22.95 kg/m  09/08/18 23.37 kg/m   Wt Readings from Last 3 Encounters:  12/31/18 131 lb 9.6 oz (59.7 kg)  09/28/18 131 lb 9.6 oz (59.7 kg)  09/08/18 127 lb 12.8 oz (58 kg)     CBC    Component Value Date/Time   WBC 11.4 (H) 04/20/2018 1035   WBC 7.0 10/01/2017 1114   RBC 4.75 04/20/2018 1035   RBC 4.61 10/01/2017 1114   HGB 14.5 04/20/2018 1035   HGB 12.6 05/20/2011 1331   HCT 43.3 04/20/2018 1035   HCT 37.5 05/20/2011 1331   PLT 252 04/20/2018 1035   MCV 91 04/20/2018 1035   MCV 93.7 05/20/2011 1331   MCH 30.5 04/20/2018 1035   MCH 29.9 10/01/2017 1114   MCHC 33.5 04/20/2018 1035   MCHC 33.6 10/01/2017 1114   RDW  12.6 04/20/2018 1035   RDW 15.3 (H) 05/20/2011 1331   LYMPHSABS 1.2 04/20/2018 1035   LYMPHSABS 1.3 05/20/2011 1331   MONOABS 1.2 (H) 09/07/2016 1725   MONOABS 0.4 05/20/2011 1331   EOSABS 0.0 04/20/2018 1035   BASOSABS 0.0 04/20/2018 1035   BASOSABS 0.0 05/20/2011 1331    Chest Imaging: None  Pulmonary Functions Testing Results: PFT Results Latest Ref Rng & Units 08/18/2015  FVC-Pre L 2.43  FVC-Predicted Pre % 98  FVC-Post L 2.45  FVC-Predicted Post % 98  Pre FEV1/FVC % % 84  Post FEV1/FCV % % 86  FEV1-Pre L 2.05  FEV1-Predicted Pre % 111  FEV1-Post L 2.10  DLCO UNC% % 76  DLCO COR %Predicted % 88  TLC L 4.57  TLC % Predicted % 93  RV % Predicted % 92  Interpretation: WNL  Echo diastolic dysfunction [id number] Nuclear stress WNL 2019   Assessment & Plan:   # Mild persistent asthma with perhaps a mild flare, azithromycin has helped in past # Anxiety and panic attacks worsened by inhalers and steroids  Discussion: - Continue symbicort 80/4.5 BID, rinse mouth after use - Drop prednisone to 5mg  x 1 week - Switch albuterol from standing BID to PRN - Before albuterol, try deep breathing exercises, if no help can try albuterol + a dose of xanax -  I am fine with her taking a low dose xanax every now and then given the improvement in her QoL on this med with respect to breathing.  Klonipin can be tried at later date if there is dependency/abuse concerns - She will f/u with me in 1 mo to discuss her symptoms further and establish regular f/u schedule   Current Outpatient Medications:  .  acetaminophen (TYLENOL) 500 MG tablet, Take by mouth as needed., Disp: , Rfl:  .  albuterol (PROAIR HFA) 108 (90 Base) MCG/ACT inhaler, Inhale 2 puffs into the lungs every 6 (six) hours as needed., Disp: 1 Inhaler, Rfl: 5 .  amLODipine (NORVASC) 5 MG tablet, Take 5 mg by mouth daily., Disp: , Rfl:  .  budesonide-formoterol (SYMBICORT) 80-4.5 MCG/ACT inhaler, Inhale 2 puffs into the lungs 2 (two)  times daily., Disp: 10.2 g, Rfl: 0 .  busPIRone (BUSPAR) 7.5 MG tablet, Take 1 tablet (7.5 mg total) by mouth 3 (three) times daily., Disp: 90 tablet, Rfl: 5 .  cetirizine (ZYRTEC) 10 MG tablet, Take 10 mg by mouth daily. , Disp: , Rfl:  .  chlorthalidone (HYGROTON) 25 MG tablet, TAKE 1/2 TABLET BY MOUTH DAILY, Disp: 45 tablet, Rfl: 3 .  Cholecalciferol (VITAMIN D3) 5000 units TABS, 5,000 IU OTC vitamin D3 daily., Disp: 90 tablet, Rfl: 3 .  docusate sodium (COLACE) 100 MG capsule, Take 1 capsule (100 mg total) by mouth 2 (two) times daily. (Patient taking differently: Take 100 mg by mouth as needed. ), Disp: 10 capsule, Rfl: 0 .  DULoxetine (CYMBALTA) 30 MG capsule, TAKE 1 CAPSULE BY MOUTH DAILY, Disp: 90 capsule, Rfl: 0 .  ferrous sulfate (FERROUSUL) 325 (65 FE) MG tablet, Take 1 tablet (325 mg total) by mouth 3 (three) times daily with meals. (Patient taking differently: Take 325 mg by mouth 2 (two) times daily with a meal. ), Disp: , Rfl:  .  fluticasone (FLONASE) 50 MCG/ACT nasal spray, USE 2 SPRAYS INTO EACH NOSTRIL EVERY DAY, Disp: 48 g, Rfl: 3 .  losartan (COZAAR) 100 MG tablet, TAKE 1 TABLET BY MOUTH DAILY, Disp: 90 tablet, Rfl: 1 .  methocarbamol (ROBAXIN) 500 MG tablet, as needed., Disp: , Rfl:  .  montelukast (SINGULAIR) 10 MG tablet, Take 1 tablet (10 mg total) by mouth at bedtime., Disp: 90 tablet, Rfl: 1 .  omeprazole (PRILOSEC) 20 MG capsule, TAKE 1 CAPSULE BY MOUTH DAILY, Disp: 90 capsule, Rfl: 1 .  traMADol (ULTRAM) 50 MG tablet, Take by mouth as needed., Disp: , Rfl:  .  vitamin B-12 (CYANOCOBALAMIN) 1000 MCG tablet, Take 1 tablet (1,000 mcg total) by mouth daily., Disp: 90 tablet, Rfl: 3 .  ALPRAZolam (XANAX) 0.25 MG tablet, Take 1 tablet (0.25 mg total) by mouth 3 (three) times daily as needed for anxiety., Disp: 50 tablet, Rfl: 0 .  azithromycin (ZITHROMAX) 250 MG tablet, 2 tabs a day for first day, 1 tab a day for days 2-5, Disp: 6 tablet, Rfl: 0 .  predniSONE (DELTASONE) 5 MG  tablet, Take 1 tablet (5 mg total) by mouth daily with breakfast., Disp: 7 tablet, Rfl: 0   Candee Furbish, MD [facility] 12/31/2018 11:32 AM

## 2018-12-29 NOTE — Patient Instructions (Addendum)
Thank you for visiting Dr. Tamala Julian at [facility].  - Z pack - Prednisone, stop 50mg /day, start 5 mg/day for 1 week - Continue symbicort - Stop taking albuterol twice a day, use it only in emergencies - Practice deep breathing when feeling SOB, if not helping, take albuterol and xanax - Call if not feeling better by next week  Today we recommend the following: No orders of the defined types were placed in this encounter.  Meds ordered this encounter  Medications  . azithromycin (ZITHROMAX) 250 MG tablet    Sig: 2 tabs a day for first day, 1 tab a day for days 2-5    Dispense:  6 tablet    Refill:  0  . predniSONE (DELTASONE) 5 MG tablet    Sig: Take 1 tablet (5 mg total) by mouth daily with breakfast.    Dispense:  7 tablet    Refill:  0   No follow-ups on file.    Please do your part to reduce the spread of COVID-19.

## 2018-12-29 NOTE — Assessment & Plan Note (Signed)
Plan Continue Zyrtec Continue Singulair Keep scheduled follow-up to establish care with Dr. Tamala Julian on 12/31/2018

## 2018-12-29 NOTE — Patient Instructions (Addendum)
You were seen today by Lauraine Rinne, NP  for:   1. Mild persistent asthma with acute exacerbation  Okay to take 1/2 tablet of your 50 mg of prednisone.  This should be a total of 25 mg. >>> Take daily in the morning >>>Take with food  If he started to have heart palpitations again with restarting the daily prednisone then please stop entirely   Continue Symbicort 80 >>> 2 puffs in the morning right when you wake up, rinse out your mouth after use, 12 hours later 2 puffs, rinse after use >>> Take this daily, no matter what >>> This is not a rescue inhaler   Only use your albuterol as a rescue medication to be used if you can't catch your breath by resting or doing a relaxed purse lip breathing pattern.  - The less you use it, the better it will work when you need it. - Ok to use up to 2 puffs  every 4 hours if you must but call for immediate appointment if use goes up over your usual need - Don't leave home without it !!  (think of it like the spare tire for your car)    2. Allergic rhinitis due to other allergic trigger, unspecified seasonality  Continue daily Zyrtec  Continue Singulair  3. Anxiety state  Continue to follow-up with primary care regarding management of anxiety  4. Medication management  If you ever run out of your inhalers or medications that are managed by our office again please contact her office that we can have these refilled and we can follow-up with you.   Follow Up:    Return in about 2 days (around 12/31/2018), or if symptoms worsen or fail to improve, for Follow up with Dr. Tamala Julian.   Please do your part to reduce the spread of COVID-19:      Reduce your risk of any infection  and COVID19 by using the similar precautions used for avoiding the common cold or flu:  Marland Kitchen Wash your hands often with soap and warm water for at least 20 seconds.  If soap and water are not readily available, use an alcohol-based hand sanitizer with at least 60% alcohol.  .  If coughing or sneezing, cover your mouth and nose by coughing or sneezing into the elbow areas of your shirt or coat, into a tissue or into your sleeve (not your hands). Langley Gauss A MASK when in public  . Avoid shaking hands with others and consider head nods or verbal greetings only. . Avoid touching your eyes, nose, or mouth with unwashed hands.  . Avoid close contact with people who are sick. . Avoid places or events with large numbers of people in one location, like concerts or sporting events. . If you have some symptoms but not all symptoms, continue to monitor at home and seek medical attention if your symptoms worsen. . If you are having a medical emergency, call 911.   [place] / e-Visit: [email or web address]         [facility]: [facility]: [id number]                   [facility]: [id number]     It is flu season:   >>> Best ways to protect herself from the flu: Receive the yearly flu vaccine, practice good hand hygiene washing with soap and also using hand sanitizer when  available, eat a nutritious meals, get adequate rest, hydrate appropriately   Please contact the office if your symptoms worsen or you have concerns that you are not improving.   Thank you for choosing [facility] for your healthcare, and for allowing [name] to partner with you on your healthcare journey. I am thankful to be able to provide care to you today.   Wyn Quaker FNP-C

## 2018-12-29 NOTE — Assessment & Plan Note (Signed)
Plan: Resume 25 mg (half tablet) of prednisone until 12/31/2018 Continue Symbicort 80 Rescue inhaler 2 puffs every 6 hours as needed for shortness of breath or wheezing Keep scheduled appointment with Dr. Tamala Julian on 10/8 Continue Zyrtec daily Continue Singulair

## 2018-12-29 NOTE — Progress Notes (Signed)
Virtual Visit via Video Note  I connected with [name] on 12/29/18 at 11:45 AM EDT by a video enabled telemedicine application and verified that I am speaking with the correct person using two identifiers.  Location: Patient: Home Provider: Office - [facility] - [id number], [address], [place], [place] [address]  I discussed the limitations of evaluation and management by telemedicine and the availability of in person appointments. The patient expressed understanding and agreed to proceed. I also discussed with the patient that there may be a patient responsible charge related to this service. The patient expressed understanding and agreed to proceed.  Patient consented to consult via telephone: Yes People present and their role in pt care: Pt   History of Present Illness:  85 year old female followed in our office for asthma  Past medical history: Hyperlipidemia, anxiety, GERD, prediabetes, chronic fatigue, depression, migraine, insomnia smoking history: Maintenance: Symbicort 80 Patient of Dr. Lake Bells  Chief complaint:    85 year old female followed in our office for asthma.  Former patient of Dr. Joya Gaskins.  Last seen in our office in March/2020 by Dr. Lake Bells.  At that office visit patient's symptoms were stable she had received a steroid shot from her orthopedist prior and she felt that her symptoms had resolved.  Prior to that she had had multiple rounds of sinusitis and bronchitis-like episodes where she saw [place].  Symptoms persisted despite treatment.  In March/2020 Dr. Anastasia Pall last note mentions that we may need to consider biologic therapy as well as repeating blood work to check peripheral eosinophilia as well as IgE.  Those results have been unremarkable in 2017 as well as in January 2020.  Patient was seen at an urgent care on 12/26/2018 where she was treated for acute bronchitis.  Patient was prescribed 50 mg of prednisone to take for 5 days.  Patient contacted  our office on 12/28/2018 notifying [name] that she started to have palpitations when she was taking this dose of prednisone.  She was instructed via triage NP to contact urgent care provider who prescribed medication as well as offered a visit.  Patient has an appointment this week to see Dr. Tamala Julian to establish care with him.  Patient completing a video visit today.  She reports that symptoms started on 12/23/2018.  She had increased work of breathing, increased cough, chest tightness and wheezing.  She reports that symptoms worsened on 12/24/2018.  She presented to an urgent care on 12/25/2018.  Patient was started on 50 mg of prednisone which she began on 12/26/2018.  She took 3 doses of prednisone 50 mg before she started to have heart palpitations on 12/28/2018.  She also reports that she had previously ran out of using her Symbicort 80.  She had tried to follow-up with our office through a pharmacy refill but the medication was not refilled.  She reports she was off of this inhaler for about a month.  She resumed using Symbicort 80 on 12/26/2018.  She reports she is also using her rescue, twice daily.  She reports that this is how Dr. Lake Bells had instructed her to use this.  Patient reports that on 12/28/2018 she started to have heart palpitations.  She suspected this was potentially due to her prednisone dose.  She did not take her prednisone dose today.  She took an anxiety pill half a tablet of Xanax which is managed by primary care on 12/28/2018 she reported that the symptoms improved.  This prompted the video visit which  she is completing today.  Observations/Objective:  Immunization History  Administered Date(s) Administered  . Fluad Quad(high Dose 65+) 12/16/2018  . Influenza Split 01/03/2012  . Influenza Whole 12/19/2008, 12/23/2009, 11/13/2010  . Influenza, High Dose Seasonal PF 12/13/2013, 01/10/2015, 01/11/2016, 01/03/2017, 12/19/2017  . Influenza,inj,Quad PF,6+ Mos 11/19/2012  .  Influenza,inj,quad, With Preservative 12/23/2016, 01/26/2018  . Influenza-Unspecified 01/10/2015, 01/03/2017  . Pneumococcal Conjugate-13 01/28/2013  . Pneumococcal Polysaccharide-23 12/24/2007  . Tdap 11/13/2010  . Zoster Recombinat (Shingrix) 09/08/2018, 11/19/2018   Imaging: 11/2017 CXR images: Hyperinflation, no infiltrate  Labs: March 2017 eosinophil count 100 cells per microliter, IgE 3 03/2018 CBC with diff eosinophil count is 0,   PFT May 2017 pulmonary function testing ratio 86%, FEV1 2.10 L (113% predicted), FVC 2.45 (98% predicted), no change in FEV1 with bronchodilator, total lung capacity 4.57 L (93% predicted). DLCO 17.47 (76% predicted). December 19, 2017 spirometry ratio 49%, FEV1 1.1 L 66% predicted, good start, good effort acceptable by ATS criteria  Echo: 01/2018 > LVEF 55-60%  [facility] Lab Results  Component Value Date   NITRICOXIDE 15 12/19/2017     Assessment and Plan:  Allergic rhinitis Plan Continue Zyrtec Continue Singulair Keep scheduled follow-up to establish care with Dr. Tamala Julian on 12/31/2018  Asthma, mild persistent Plan: Resume 25 mg (half tablet) of prednisone until 12/31/2018 Continue Symbicort 80 Rescue inhaler 2 puffs every 6 hours as needed for shortness of breath or wheezing Keep scheduled appointment with Dr. Tamala Julian on 10/8 Continue Zyrtec daily Continue Singulair    Follow Up Instructions:  Return in about 2 days (around 12/31/2018), or if symptoms worsen or fail to improve, for Follow up with Dr. Tamala Julian.    I discussed the assessment and treatment plan with the patient. The patient was provided an opportunity to ask questions and all were answered. The patient agreed with the plan and demonstrated an understanding of the instructions.   The patient was advised to call back or seek an in-person evaluation if the symptoms worsen or if the condition fails to improve as anticipated.  I provided 23 minutes of non-face-to-face time during  this encounter.   Lauraine Rinne, NP

## 2018-12-31 ENCOUNTER — Ambulatory Visit (INDEPENDENT_AMBULATORY_CARE_PROVIDER_SITE_OTHER): Payer: Medicare Other | Admitting: Internal Medicine

## 2018-12-31 ENCOUNTER — Encounter: Payer: Self-pay | Admitting: Internal Medicine

## 2018-12-31 ENCOUNTER — Other Ambulatory Visit: Payer: Self-pay

## 2018-12-31 VITALS — BP 140/76 | HR 82 | Temp 97.2°F | Ht 63.0 in | Wt 131.6 lb

## 2018-12-31 DIAGNOSIS — J4531 Mild persistent asthma with (acute) exacerbation: Secondary | ICD-10-CM | POA: Diagnosis not present

## 2018-12-31 DIAGNOSIS — F410 Panic disorder [episodic paroxysmal anxiety] without agoraphobia: Secondary | ICD-10-CM | POA: Diagnosis not present

## 2018-12-31 MED ORDER — AZITHROMYCIN 250 MG PO TABS: ORAL_TABLET | ORAL | 0 refills | Status: DC

## 2018-12-31 MED ORDER — ALPRAZOLAM 0.25 MG PO TABS: 0.2500 mg | ORAL_TABLET | Freq: Three times a day (TID) | ORAL | 0 refills | Status: DC | PRN

## 2018-12-31 MED ORDER — PREDNISONE 5 MG PO TABS: 5.0000 mg | ORAL_TABLET | Freq: Every day | ORAL | 0 refills | Status: DC

## 2019-01-02 NOTE — Progress Notes (Signed)
Reviewed, agree 

## 2019-01-07 ENCOUNTER — Telehealth: Payer: Self-pay | Admitting: Cardiovascular Disease

## 2019-01-07 NOTE — Telephone Encounter (Signed)
Spoke with pt and for about a month has noted B/P creeping up Pt also notes head tightness and has not had a migraine in 10 years Pt was recently started on Zpak and prednisone will finish Prednisone today but these readings were occurring prior to the Bronchitis the patient currently taking Losartan 100 mg in pm Amlodipine 5 mg in am and Chlorathalidone 25 mg 1/2 tab in am Will forward to Dr [name] for review .Adonis Housekeeper

## 2019-01-07 NOTE — Telephone Encounter (Signed)
Pt c/o BP issue: STAT if pt c/o blurred vision, one-sided weakness or slurred speech  1. What are your last 5 BP readings? (M) 136/75 pulse 95 (T) 9 am 154/67/ pulse 76 (W) 152/70 pulse 84 (TH) 148/90 pulse 104  2. Are you having any other symptoms (ex. Dizziness, headache, blurred vision, passed out)? No tightness in her head  3. What is your BP issue? Pulse high

## 2019-01-08 NOTE — Telephone Encounter (Addendum)
Spoke with pt and informed of the following from Dr. [name]:  "Elevated pulse may be secondary to prednisone. BP for the most part look OK"  Pt verbalized understanding. Pt concerned about elevated BP readings. Advised pt to keep BP log to keep track of BP trending down and can f/u with [name], DNP on 10/26 at 3:15pm. Pt agreeable

## 2019-01-08 NOTE — Telephone Encounter (Signed)
Elevated pulse may be secondary to prednisone. BP for the most part look OK

## 2019-01-14 ENCOUNTER — Telehealth: Payer: Self-pay | Admitting: Family Medicine

## 2019-01-14 NOTE — Telephone Encounter (Signed)
Needs appt

## 2019-01-14 NOTE — Telephone Encounter (Signed)
Patient called states she has been sweating excessively mostly @ night but also notices it happening during the day too.  --Patient request A1c test ---   --Forwarding request to medical asst for review w/ provider  --- per patient her home phone is messed up right now so do not try to call it--- Patient to call back tomorrow.  --glh

## 2019-01-14 NOTE — Telephone Encounter (Signed)
See below, is office visit needed for patient?

## 2019-01-15 NOTE — Telephone Encounter (Signed)
Call and schedule patient please

## 2019-01-18 ENCOUNTER — Ambulatory Visit: Payer: Medicare Other | Admitting: Adult Health

## 2019-01-21 ENCOUNTER — Telehealth: Payer: Self-pay | Admitting: Internal Medicine

## 2019-01-21 NOTE — Telephone Encounter (Signed)
Call returned to patient, she reports when she saw Dr. Tamala Julian he recommended that she not use her albuterol inhaler until he saw her again. She reports up until today she has been obedient to these instructions. She reports she has completed the prednisone and the antibiotic and she feels better but she did have an episode this morning and she just wanted to make him aware.   I reviewed the AVS with the patient and explained that he did not want her to stop the albuterol but to only use it when needed and not to use it routinely as a maintenance inhaler. Patient was relieved as she felt she had done something wrong. Voiced her appreciation for the call. Nothing further needed at this time.

## 2019-01-27 ENCOUNTER — Ambulatory Visit: Payer: Medicare Other | Admitting: Family Medicine

## 2019-01-28 DIAGNOSIS — M5136 Other intervertebral disc degeneration, lumbar region: Secondary | ICD-10-CM | POA: Diagnosis not present

## 2019-01-28 NOTE — Progress Notes (Signed)
Synopsis: Longstanding patient of Dr. Lake Bells managed here for asthma  Subjective:   PATIENT ID: [name] GENDER: female DOB: Aug 14, 1933, MRN: [id number]  No chief complaint on file.   HPI Here for asthma, recurrent sinus congestion complicated by anxiety disorder.  Transferring care from  Dr. Lake Bells - Regimen consists of symbicort 80/4.5 BID, singulair, omeprazole, flonase, zyrtec, PRN xanax  IgE 3 2017, 4 2009 Eos not elevated on any checks I can see  Seen last month for flare, given breathing exercises and told to take xanax if it helps her.  Goal here is to improve QoL and prevent recurrent doctor visits for this issue.  ROS + symptoms in bold Fevers, chills, weight loss Nausea, vomiting, diarrhea Shortness of breath, wheezing, cough Chest pain, palpitations, lower ext edema  Doing very well!  Xanax seems to improve her breathing greatly and increase QoL.   No falls/dizziness or other negative sequelae.    Past Medical History:  Diagnosis Date  . ALLERGIC RHINITIS   . ANEMIA, IRON DEFICIENCY   . ANXIETY   . ARTHRITIS    s/p bilateral THR  . ASTHMA, EXTRINSIC   . Bronchitis   . Cataracts, both eyes   . Chronic constipation   . FIBROMYALGIA    fibromyalgia  . G E R D   . High cholesterol   . HYPERLIPIDEMIA   . HYPERTENSION   . Interstitial cystitis   . MIGRAINE HEADACHE      Family History  Problem Relation Age of Onset  . Arthritis Mother   . Hypertension Mother   . Heart disease Father   . Arthritis Father   . Hypertension Father   . Depression Sister   . Hyperlipidemia Sister   . Hypertension Other   . Hyperlipidemia Other      Past Surgical History:  Procedure Laterality Date  . ABDOMINAL HYSTERECTOMY    . ANTERIOR HIP REVISION Right 09/24/2016   Procedure: RIGHT HIP ACETABULUM AND FEMORAL HEAD REVISION WITH BONE GRAFT;  Surgeon: Paralee Cancel, MD;  Location: [facility];  Service: Orthopedics;  Laterality: Right;  . APPENDECTOMY    . BACK  SURGERY    . CATARACT EXTRACTION    . COLONOSCOPY    . DOPPLER ECHOCARDIOGRAPHY    . ESOPHAGOGASTRODUODENOSCOPY ENDOSCOPY    . JOINT REPLACEMENT    . LUMBAR DISC SURGERY    . NM MYOVIEW LTD     stress test  . Ovarian cyst removed    . ROTATOR CUFF REPAIR Left 5-6 years ago   Dr Onnie Graham; done at [facility] surgery center   . TOTAL HIP ARTHROPLASTY    . TOTAL SHOULDER ARTHROPLASTY      Social History   Socioeconomic History  . Marital status: Widowed    Spouse name: Not on file  . Number of children: Not on file  . Years of education: Not on file  . Highest education level: Not on file  Occupational History  . Not on file  Social Needs  . Financial resource strain: Not on file  . Food insecurity    Worry: Not on file    Inability: Not on file  . Transportation needs    Medical: Not on file    Non-medical: Not on file  Tobacco Use  . Smoking status: Former Smoker    Packs/day: 0.10    Years: 10.00    Pack years: 1.00    Types: Cigarettes    Quit date: 03/25/1968    Years  since quitting: 50.8  . Smokeless tobacco: Never Used  . 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 Retired Visual merchandiser and Sexual Activity  . Alcohol use: No    Alcohol/week: 0.0 standard drinks  . Drug use: No  . Sexual activity: Not Currently  Lifestyle  . Physical activity    Days per week: Not on file    Minutes per session: Not on file  . Stress: Not on file  Relationships  . Social Herbalist on phone: Not on file    Gets together: Not on file    Attends religious service: Not on file    Active member of club or organization: Not on file    Attends meetings of clubs or organizations: Not on file    Relationship status: Not on file  . Intimate partner violence    Fear of current or ex partner: Not on file    Emotionally abused: Not on file    Physically abused: Not on file    Forced sexual activity: Not on file  Other Topics Concern  . Not on file  Social History  Narrative   Lives with husband in a 3 story home.  Has 2 children.  Retired Glass blower/designer.  Education: Scientist, water quality.     Allergies  Allergen Reactions  . Morphine Sulfate Other (See Comments)    Makes her hyper     Outpatient Medications Prior to Visit  Medication Sig Dispense Refill  . acetaminophen (TYLENOL) 500 MG tablet Take by mouth as needed.    Marland Kitchen albuterol (PROAIR HFA) 108 (90 Base) MCG/ACT inhaler Inhale 2 puffs into the lungs every 6 (six) hours as needed. 1 Inhaler 5  . ALPRAZolam (XANAX) 0.25 MG tablet Take 1 tablet (0.25 mg total) by mouth 3 (three) times daily as needed for anxiety. 50 tablet 0  . amLODipine (NORVASC) 5 MG tablet Take 5 mg by mouth daily.    Marland Kitchen azithromycin (ZITHROMAX) 250 MG tablet 2 tabs a day for first day, 1 tab a day for days 2-5 6 tablet 0  . budesonide-formoterol (SYMBICORT) 80-4.5 MCG/ACT inhaler Inhale 2 puffs into the lungs 2 (two) times daily. 10.2 g 0  . busPIRone (BUSPAR) 7.5 MG tablet Take 1 tablet (7.5 mg total) by mouth 3 (three) times daily. 90 tablet 5  . cetirizine (ZYRTEC) 10 MG tablet Take 10 mg by mouth daily.     . chlorthalidone (HYGROTON) 25 MG tablet TAKE 1/2 TABLET BY MOUTH DAILY 45 tablet 3  . Cholecalciferol (VITAMIN D3) 5000 units TABS 5,000 IU OTC vitamin D3 daily. 90 tablet 3  . docusate sodium (COLACE) 100 MG capsule Take 1 capsule (100 mg total) by mouth 2 (two) times daily. (Patient taking differently: Take 100 mg by mouth as needed. ) 10 capsule 0  . DULoxetine (CYMBALTA) 30 MG capsule TAKE 1 CAPSULE BY MOUTH DAILY 90 capsule 0  . ferrous sulfate (FERROUSUL) 325 (65 FE) MG tablet Take 1 tablet (325 mg total) by mouth 3 (three) times daily with meals. (Patient taking differently: Take 325 mg by mouth 2 (two) times daily with a meal. )    . fluticasone (FLONASE) 50 MCG/ACT nasal spray USE 2 SPRAYS INTO EACH NOSTRIL EVERY DAY 48 g 3  . losartan (COZAAR) 100 MG tablet TAKE 1 TABLET BY MOUTH DAILY 90 tablet 1  . methocarbamol  (ROBAXIN) 500 MG tablet as needed.    . montelukast (SINGULAIR) 10 MG tablet Take 1 tablet (10  mg total) by mouth at bedtime. 90 tablet 1  . omeprazole (PRILOSEC) 20 MG capsule TAKE 1 CAPSULE BY MOUTH DAILY 90 capsule 1  . predniSONE (DELTASONE) 5 MG tablet Take 1 tablet (5 mg total) by mouth daily with breakfast. 7 tablet 0  . traMADol (ULTRAM) 50 MG tablet Take by mouth as needed.    . vitamin B-12 (CYANOCOBALAMIN) 1000 MCG tablet Take 1 tablet (1,000 mcg total) by mouth daily. 90 tablet 3   No facility-administered medications prior to visit.      Objective:  GEN: elderly woman in NAD HEENT: mask on CV: RRR, ext warm PULM: completely clear in all lung fields GI: Soft, +BS EXT: no edema NEURO: moves all 4 ext to command PSYCH: AOx3, anxious SKIN: No rashes    There were no vitals filed for this visit.   on RA BMI Readings from Last 3 Encounters:  12/31/18 23.31 kg/m  09/28/18 22.95 kg/m  09/08/18 23.37 kg/m   Wt Readings from Last 3 Encounters:  12/31/18 131 lb 9.6 oz (59.7 kg)  09/28/18 131 lb 9.6 oz (59.7 kg)  09/08/18 127 lb 12.8 oz (58 kg)     CBC    Component Value Date/Time   WBC 11.4 (H) 04/20/2018 1035   WBC 7.0 10/01/2017 1114   RBC 4.75 04/20/2018 1035   RBC 4.61 10/01/2017 1114   HGB 14.5 04/20/2018 1035   HGB 12.6 05/20/2011 1331   HCT 43.3 04/20/2018 1035   HCT 37.5 05/20/2011 1331   PLT 252 04/20/2018 1035   MCV 91 04/20/2018 1035   MCV 93.7 05/20/2011 1331   MCH 30.5 04/20/2018 1035   MCH 29.9 10/01/2017 1114   MCHC 33.5 04/20/2018 1035   MCHC 33.6 10/01/2017 1114   RDW 12.6 04/20/2018 1035   RDW 15.3 (H) 05/20/2011 1331   LYMPHSABS 1.2 04/20/2018 1035   LYMPHSABS 1.3 05/20/2011 1331   MONOABS 1.2 (H) 09/07/2016 1725   MONOABS 0.4 05/20/2011 1331   EOSABS 0.0 04/20/2018 1035   BASOSABS 0.0 04/20/2018 1035   BASOSABS 0.0 05/20/2011 1331    Chest Imaging: None  Pulmonary Functions Testing Results: PFT Results Latest Ref Rng &  Units 08/18/2015  FVC-Pre L 2.43  FVC-Predicted Pre % 98  FVC-Post L 2.45  FVC-Predicted Post % 98  Pre FEV1/FVC % % 84  Post FEV1/FCV % % 86  FEV1-Pre L 2.05  FEV1-Predicted Pre % 111  FEV1-Post L 2.10  DLCO UNC% % 76  DLCO COR %Predicted % 88  TLC L 4.57  TLC % Predicted % 93  RV % Predicted % 92  Interpretation: WNL  Echo diastolic dysfunction [id number] Nuclear stress WNL 2019   Assessment & Plan:   # Mild persistent asthma with superimposed panic attack disorder # Panic attacks worsened by frequent albuterol and prednisone Rx; using xanax has prevented her from needing these meds  Discussion: - Continue symbicort 80/4.5 BID and albuterol PRN - Continue xanax PRN and breathing exercises, usual benzo precautions discussed - f/u in 6 months or sooner if doing unwell   Current Outpatient Medications:  .  acetaminophen (TYLENOL) 500 MG tablet, Take by mouth as needed., Disp: , Rfl:  .  albuterol (PROAIR HFA) 108 (90 Base) MCG/ACT inhaler, Inhale 2 puffs into the lungs every 6 (six) hours as needed., Disp: 1 Inhaler, Rfl: 5 .  ALPRAZolam (XANAX) 0.25 MG tablet, Take 1 tablet (0.25 mg total) by mouth 3 (three) times daily as needed for anxiety., Disp: 50 tablet, Rfl:  0 .  amLODipine (NORVASC) 5 MG tablet, Take 5 mg by mouth daily., Disp: , Rfl:  .  azithromycin (ZITHROMAX) 250 MG tablet, 2 tabs a day for first day, 1 tab a day for days 2-5, Disp: 6 tablet, Rfl: 0 .  budesonide-formoterol (SYMBICORT) 80-4.5 MCG/ACT inhaler, Inhale 2 puffs into the lungs 2 (two) times daily., Disp: 10.2 g, Rfl: 0 .  busPIRone (BUSPAR) 7.5 MG tablet, Take 1 tablet (7.5 mg total) by mouth 3 (three) times daily., Disp: 90 tablet, Rfl: 5 .  cetirizine (ZYRTEC) 10 MG tablet, Take 10 mg by mouth daily. , Disp: , Rfl:  .  chlorthalidone (HYGROTON) 25 MG tablet, TAKE 1/2 TABLET BY MOUTH DAILY, Disp: 45 tablet, Rfl: 3 .  Cholecalciferol (VITAMIN D3) 5000 units TABS, 5,000 IU OTC vitamin D3 daily., Disp: 90  tablet, Rfl: 3 .  docusate sodium (COLACE) 100 MG capsule, Take 1 capsule (100 mg total) by mouth 2 (two) times daily. (Patient taking differently: Take 100 mg by mouth as needed. ), Disp: 10 capsule, Rfl: 0 .  DULoxetine (CYMBALTA) 30 MG capsule, TAKE 1 CAPSULE BY MOUTH DAILY, Disp: 90 capsule, Rfl: 0 .  ferrous sulfate (FERROUSUL) 325 (65 FE) MG tablet, Take 1 tablet (325 mg total) by mouth 3 (three) times daily with meals. (Patient taking differently: Take 325 mg by mouth 2 (two) times daily with a meal. ), Disp: , Rfl:  .  fluticasone (FLONASE) 50 MCG/ACT nasal spray, USE 2 SPRAYS INTO EACH NOSTRIL EVERY DAY, Disp: 48 g, Rfl: 3 .  losartan (COZAAR) 100 MG tablet, TAKE 1 TABLET BY MOUTH DAILY, Disp: 90 tablet, Rfl: 1 .  methocarbamol (ROBAXIN) 500 MG tablet, as needed., Disp: , Rfl:  .  montelukast (SINGULAIR) 10 MG tablet, Take 1 tablet (10 mg total) by mouth at bedtime., Disp: 90 tablet, Rfl: 1 .  omeprazole (PRILOSEC) 20 MG capsule, TAKE 1 CAPSULE BY MOUTH DAILY, Disp: 90 capsule, Rfl: 1 .  predniSONE (DELTASONE) 5 MG tablet, Take 1 tablet (5 mg total) by mouth daily with breakfast., Disp: 7 tablet, Rfl: 0 .  traMADol (ULTRAM) 50 MG tablet, Take by mouth as needed., Disp: , Rfl:  .  vitamin B-12 (CYANOCOBALAMIN) 1000 MCG tablet, Take 1 tablet (1,000 mcg total) by mouth daily., Disp: 90 tablet, Rfl: 3   Candee Furbish, MD [facility] 01/28/2019 9:18 AM

## 2019-01-29 ENCOUNTER — Other Ambulatory Visit: Payer: Self-pay

## 2019-01-29 ENCOUNTER — Encounter: Payer: Self-pay | Admitting: Internal Medicine

## 2019-01-29 ENCOUNTER — Ambulatory Visit (INDEPENDENT_AMBULATORY_CARE_PROVIDER_SITE_OTHER): Payer: Medicare Other | Admitting: Internal Medicine

## 2019-01-29 VITALS — BP 118/78 | HR 78 | Temp 97.7°F | Ht 64.0 in | Wt 133.8 lb

## 2019-01-29 DIAGNOSIS — F410 Panic disorder [episodic paroxysmal anxiety] without agoraphobia: Secondary | ICD-10-CM | POA: Diagnosis not present

## 2019-01-29 DIAGNOSIS — J4530 Mild persistent asthma, uncomplicated: Secondary | ICD-10-CM | POA: Diagnosis not present

## 2019-01-29 MED ORDER — ALPRAZOLAM 0.25 MG PO TABS: 0.2500 mg | ORAL_TABLET | Freq: Three times a day (TID) | ORAL | 5 refills | Status: DC | PRN

## 2019-01-29 MED ORDER — BUDESONIDE-FORMOTEROL FUMARATE 80-4.5 MCG/ACT IN AERO: 2.0000 | INHALATION_SPRAY | Freq: Two times a day (BID) | RESPIRATORY_TRACT | 5 refills | Status: DC

## 2019-02-01 ENCOUNTER — Encounter: Payer: Self-pay | Admitting: Family Medicine

## 2019-02-01 ENCOUNTER — Other Ambulatory Visit: Payer: Self-pay

## 2019-02-01 ENCOUNTER — Ambulatory Visit (INDEPENDENT_AMBULATORY_CARE_PROVIDER_SITE_OTHER): Payer: Medicare Other | Admitting: Family Medicine

## 2019-02-01 VITALS — BP 119/67 | HR 92 | Temp 98.4°F | Resp 10 | Ht 61.81 in | Wt 138.0 lb

## 2019-02-01 DIAGNOSIS — M797 Fibromyalgia: Secondary | ICD-10-CM

## 2019-02-01 DIAGNOSIS — F32A Depression, unspecified: Secondary | ICD-10-CM

## 2019-02-01 DIAGNOSIS — J4531 Mild persistent asthma with (acute) exacerbation: Secondary | ICD-10-CM

## 2019-02-01 DIAGNOSIS — F410 Panic disorder [episodic paroxysmal anxiety] without agoraphobia: Secondary | ICD-10-CM | POA: Diagnosis not present

## 2019-02-01 DIAGNOSIS — I10 Essential (primary) hypertension: Secondary | ICD-10-CM | POA: Diagnosis not present

## 2019-02-01 DIAGNOSIS — R5382 Chronic fatigue, unspecified: Secondary | ICD-10-CM

## 2019-02-01 DIAGNOSIS — F329 Major depressive disorder, single episode, unspecified: Secondary | ICD-10-CM | POA: Diagnosis not present

## 2019-02-01 DIAGNOSIS — E559 Vitamin D deficiency, unspecified: Secondary | ICD-10-CM | POA: Diagnosis not present

## 2019-02-01 DIAGNOSIS — R7303 Prediabetes: Secondary | ICD-10-CM

## 2019-02-01 DIAGNOSIS — F411 Generalized anxiety disorder: Secondary | ICD-10-CM | POA: Diagnosis not present

## 2019-02-01 DIAGNOSIS — M5416 Radiculopathy, lumbar region: Secondary | ICD-10-CM

## 2019-02-01 DIAGNOSIS — D509 Iron deficiency anemia, unspecified: Secondary | ICD-10-CM | POA: Diagnosis not present

## 2019-02-01 DIAGNOSIS — E538 Deficiency of other specified B group vitamins: Secondary | ICD-10-CM

## 2019-02-01 DIAGNOSIS — F430 Acute stress reaction: Secondary | ICD-10-CM

## 2019-02-01 MED ORDER — ESCITALOPRAM OXALATE 5 MG PO TABS: 5.0000 mg | ORAL_TABLET | Freq: Every day | ORAL | 0 refills | Status: DC

## 2019-02-01 NOTE — Patient Instructions (Signed)
Behavioral Health/ Counseling Referrals      [facility] Sigurd Sos Phipps:  [place] [place], [place] [phone] (not exclusive, is a Economist, but does have special interest in eating disorders, body dysmorphia syndrome etc)     [facility]: Counselors : Lynden Ang and partner Phil Dopp in Adult & Pediatric Nutrition Counseling Therapy for:  Eating Disorders  Disordered & Emotional Eating  Non-Diet Nutrition Counseling  Family Feeding Coaching  Weight- Neutral Diabetes Management    Anderson Malta, personal counselor in [place], specializing in marriage counseling    Dr. Tomi Bamberger, PHD Dr. Tomi Bamberger, PHD is a counselor in [place], [place].  [address] [place], [place] [address] [place] [phone]   Kristie Cowman, [place]  [phone] [email or web address] ( she does [place] and faith-based coaching and counseling )    [employer]- ( faith-based counseling ) Address: [address] [place], [place] [address] [phone] [address] for appointments [phone] Fax Hours: Monday - Thursday 8:00am-6:00pm Closed for lunch 12-1Thursday only Friday: Closed all day   Steffanie Rainwater: [phone] or Meg [phone] -counselors in [place] who are faith based   -Also Ms. Marya Fossa - [place] behavioral medicine. Darrick Meigs based counseling.    [facility] Nunzio Cobbs, LCSW, ACSW, M.ED.  -Nunzio Cobbs is a licensed clinical social worker in practice over 35 years and with Dr. Chucky May for the last 10 years.  -She sees adults, adolescents, children & families and couples. -Services are provided for mood and anxiety disorders, marital issues, family or parent/child problems, parenting, co-dependency, gender issues,  trauma, grief, and stages of life issues. She also provides critical incident stress debriefing.  -Pamala Hurry accepts many employee assistance programs (EAP), [employer], Pharmacist, hospital.  PHONE  [phone]                FAX [phone]   Rodena Goldmann -[email or web address] [employer]- gen counseling;  PHD   Wilber Oliphant, MSW [address] [place]   [place] Apolonio Schneiders, PhD [address] [facility] [phone]   [place] [facility]- children [address] [place], [place]    [facility]- various counselors [place], [place] [phone] ( one patient saw Gayland Curry and really liked her)    Lanelle Bal Professional Counselor Counseling and [employer] [phone]   [place] abuse [facility]-Clinical Manager [address] [place] [phone] [place] [address] [place] Delmer Islam, PhD **   -adult, adolescent, and child Oneida Arenas, PhD Leitha Bleak, LCSW Jillene Bucks, PhD-child, adolescent and adults   [facility] [address], [place] [phone] Merilyn Baba, MS-child, adolescent and adults Lennart Pall, PhD-adolescent and adults   [facility]-grief, terminal illness [place], [place] [address], [place] [facility] [address], [place]   [place], [place]   [facility] [address], [address] [place] [phone]   [facility] of the [facility] [address],  [place] [phone]   [facility] [address], [place] [phone]   [facility] [address], [place]

## 2019-02-01 NOTE — Progress Notes (Signed)
Impression and Recommendations:    1. Depression, unspecified depression type   2. Anxiety state   3. Prediabetes- 6.0 12/17   4. Essential hypertension   5. Mild persistent asthma with acute exacerbation   6. Panic attack as reaction to stress   7. Chronic fatigue   8. Muscle pain, fibromyalgia   9. B12 deficiency   10. Iron deficiency anemia, unspecified iron deficiency anemia type   11. Vitamin D deficiency   12. Lumbar radiculopathy, chronic     Prediabetes - Patient's last HbA1c was 6.1 nine months ago. - Due for full set of fasting lab work end of January 2021.  Mild Persistent Asthma w/ Acute Exacerbation - Stable at this time on current management. - Continue treatment plan as established. - Followed by pulmonology. - Patient will continue to obtain follow-up as established.  Essential Hypertension - BP stable at this time. - Continue management as established. - Advised patient to continue ambulatory BP monitoring. - Will continue to monitor.  Depression, Anxiety State - Reviewed patient's symptoms at length today. - Extensive education provided and all questions answered.  - Advised use of prescription alternative to chronic Xanax. - Discussed that Xanax is a depressant, which may cause the patient's brain to be more sluggish, her depression to possibly exacerbate, and may contribute to unsteadiness on the feet. - Encouraged patient to reduce use of Xanax and discontinue if possible.  - Reviewed historical management of anxiety/depression with patient today. - Discussed beginning prescription mood intervention. - Managed on Lexapro in the past.  Resume Lexapro today.  See med list.  - Patient agrees to establish with therapy/counseling. - Ambulatory referral to psychology placed today.  See orders. - Additionally encouraged patient to utilize services through her pastor if they are available.  - In addition to prescription intervention and  therapy/counseling, reviewed the "spokes of the wheel" of mood and health management.  Stressed the importance of ongoing prudent habits, including regular exercise, appropriate sleep hygiene, healthful dietary habits, and prayer/meditation to relax.  - Will continue to monitor.  Lifestyle & Preventative Health Maintenance - Advised patient to continue working toward exercising to improve overall mental, physical, and emotional health.    - Encouraged patient to engage in daily physical activity as tolerated, especially a formal exercise routine.  Recommended that the patient eventually strive for at least 150 minutes of moderate cardiovascular activity per week according to guidelines established by the [facility].   - Healthy dietary habits encouraged, including low-carb, and high amounts of lean protein in diet.   - Patient should also consume adequate amounts of water.   Education and routine counseling performed. Handouts provided.  Recommendations - Discussed need for fasting lab work near future. - Last Medicare Wellness obtained 09/08/2018.   Orders Placed This Encounter  Procedures   Ambulatory referral to Psychology    Medications Discontinued During This Encounter  Medication Reason   azithromycin (ZITHROMAX) 250 MG tablet Error     Meds ordered this encounter  Medications   escitalopram (LEXAPRO) 5 MG tablet    Sig: Take 1 tablet (5 mg total) by mouth at bedtime.    Dispense:  90 tablet    Refill:  0    Gross side effects, risk and benefits, and alternatives of medications and treatment plan in general discussed with patient.  Patient is aware that all medications have potential side effects and we are unable to predict every side effect or drug-drug interaction that  may occur.   Patient will call with any questions prior to using medication if they have concerns.  Expresses verbal understanding and consents to current therapy and treatment regimen.  No barriers to  understanding were identified.  Red flag symptoms and signs discussed in detail.  Patient expressed understanding regarding what to do in case of emergency\urgent symptoms  Please see AVS handed out to patient at the end of our visit for further patient instructions/ counseling done pertaining to today's office visit.   Return for fasting blood work in 8 weeks, 1 week later OV with Dr. Raliegh Scarlet.     Note:  This document was prepared using Dragon voice recognition software and may include unintentional dictation errors.   This document serves as a record of services personally performed by [name], DO. It was created on her behalf by [name], a trained medical scribe. The creation of this record is based on the scribe's personal observations and the provider's statements to them.   This case required medical decision making of at least moderate complexity. The above documentation has been reviewed to be accurate and was completed by [name], D.O.    --------------------------------------------------------------------------------------------------------------------------------------------------------------------------------------------------------------------------------------------    Subjective:    [name], am serving as scribe for Dr. [name].  CC:  Chief Complaint  Patient presents with   Depression    HPI: [name] is a 85 y.o. female who presents to [place] at [facility] today for follow-up of mood.   - Worsening Mood Says she isn't sure what's going on "I really don't know."    - Dog with Cancer Says "first of all, I have a dog who is elderly, and she has cancer, and the vet has said that when the day comes, you'll know."  Her dog is twelve years old and a beagle mix.  Notes she sleeps in the bedroom with the patient, and patient often has to wake up to make sure that her dog is doing okay.  Her  dog has hip dysplasia and trouble getting up and down the steps.  Patient notes her dog eats well.  Her dog is blind.  Says "my dog responds, but not all the time."  Her dog is managed on tramadol and often sleepy, sleeping a lot.  Says from 6:30 to 8:00, her life is controlled by her dog, then her dog sleeps until 2-3 PM, when her dog begins pacing.  Patient says "the pacing drives me crazy, but I can't feed her too early."  Notes "she would eat 24-7, that's for sure."  Indicates that the worst part of it is "the fact that physically I cannot do anything that I really want to do;" says "I push myself until I have to go lie down."  On top of all of this, two of her best friends have died within two months of each other.  One had a stroke, and the other was found dead with no known cause of death.  Says recently she didn't watch any TV because the election was stressing her out.  - Placed on Xanax by Pulmonology Patient is using Xanax prescribed to her by pulmonologist.  Patient states she thought she was having an asthma attack or other bronchially-related spasm, but her pulmonologist was sure her symptoms were due to anxiety.  Was prescribed one Xanax (0.25 mg) three times per day, starting a month ago.  "I have noticed with the Xanax that I don't  think I'm thinking as clearly."  She typically likes to do crossword puzzles, crypto quote and all that, and now "there are days I can't concentrate, I can't think."  Says she can sit and crochet all day.  Says she fell in the road the other day while walking her dog the other morning; "I bent over to pick up poop and went down" on one knee and both hands.  Says "I was able to shift knees [and get up], but it took me a while."  - Lifestyle Patient states she has noticed that she has gained weight; noticed that she eats very fast, no matter whether she likes the food she's eating or not.  Adds "everything that I want to eat is sweets."  She reads and  crochets for enjoyment.  She also walks the dog 20 minutes, three times per day, depending on how her dog's legs are doing, and depending on how the patient's back is doing.  Notes "When I get tired, or hungry, I perspire."  Says she sweats and becomes "wet from my waist up, and it will actually drip on my face."  Says this is driving her crazy.  Notes she hasn't had her A1c checked since last January, and hasn't had her B12 checked either.  Says she continues to take "the same amount of medication I did" [in the past].  - Chronic Low Back Pain She continues on Cymbalta.  "Having much more of a problem with the numbness and pain in my feet."  Says "I have to put something on them at night in order for me to sleep."  "I understand that there's nothing they can do about that or my back."  Recently had an injection but "there's nothing they can do, and I understand that, but I get so aggravated that I can't do things."    Depression screen [facility] 2/9 02/01/2019 09/08/2018 05/27/2018  Decreased Interest 2 1 1   Down, Depressed, Hopeless 2 1 1   PHQ - 2 Score 4 2 2   Altered sleeping 2 0 0  Tired, decreased energy 2 0 1  Change in appetite 2 0 0  Feeling bad or failure about yourself  1 0 0  Trouble concentrating 0 0 1  Moving slowly or fidgety/restless 1 0 1  Suicidal thoughts 0 0 0  PHQ-9 Score 12 2 5   Difficult doing work/chores Somewhat difficult Not difficult at all Somewhat difficult  Some recent data might be hidden     GAD 7 : Generalized Anxiety Score 02/01/2019 09/08/2018 05/27/2018 10/23/2017  Nervous, Anxious, on Edge 3 1 1 1   Control/stop worrying 2 1 1 3   Worry too much - different things 3 0 1 3  Trouble relaxing 3 3 1 3   Restless 3 0 1 0  Easily annoyed or irritable 2 0 1 1  Afraid - awful might happen 1 0 - 0  Total GAD 7 Score 17 5 - 11  Anxiety Difficulty Somewhat difficult Not difficult at all - -     Wt Readings from Last 3 Encounters:  02/01/19 138 lb (62.6 kg)    01/29/19 133 lb 12.8 oz (60.7 kg)  12/31/18 131 lb 9.6 oz (59.7 kg)   BP Readings from Last 3 Encounters:  02/01/19 119/67  01/29/19 118/78  12/31/18 140/76   Pulse Readings from Last 3 Encounters:  02/01/19 92  01/29/19 78  12/31/18 82   BMI Readings from Last 3 Encounters:  02/01/19 25.40 kg/m  01/29/19 22.97 kg/m  12/31/18 23.31 kg/m         Patient Care Team    Relationship Specialty Notifications Start End  [name], DO PCP - General Family Medicine  08/30/15   Ladene Artist, MD Referring Physician Gastroenterology  07/02/10   Myrlene Broker, MD Referring Physician Urology  07/02/10   Clent Jacks, MD Referring Physician Ophthalmology  07/02/10   [name], MD Referring Physician Cardiology  07/02/10   Danella Sensing, MD Consulting Physician Dermatology  11/09/15   Juanito Doom, MD Consulting Physician Pulmonary Disease  11/09/15   Suella Broad, MD Consulting Physician Physical Medicine and Rehabilitation  11/09/15    Comment: pain mgt :  injections and pain meds  Rockne Menghini, RPH-CPP Pharmacist Cardiology  11/19/15    Comment:  " [facility]" - med mgt of her HTN per request of Dr Alvester Chou- Cards  Alda Berthold, DO Consulting Physician Neurology  12/17/15   Harrington Challenger, [facility]  Pharmacist  07/29/16    Comment: clinical pharm-D in HTN clinic  Paralee Cancel, MD Consulting Physician Orthopedic Surgery  10/23/17    Comment: R hip replacement  Bo Merino, MD Consulting Physician Rheumatology  10/23/17   Melina Schools, MD Consulting Physician Orthopedic Surgery  04/20/18   Candee Furbish, MD Consulting Physician Pulmonary Disease  02/01/19    Comment: Taking over for Dr. Lake Bells     Patient Active Problem List   Diagnosis Date Noted   Medication management 12/29/2018    Muscle spasms of back due to scoliosis 05/27/2018   Chronic back pain 05/27/2018   Atypical chest pain 02/17/2018   Primary osteoarthritis of both hands  10/21/2017   DDD (degenerative disc disease), lumbar 10/21/2017   History of COPD 10/01/2017   Former smoker 10/01/2017   Pain in joint of right shoulder 06/25/2017   Trochanteric bursitis of right hip 06/25/2017   Primary [name] disease without gangrene-  hands and feet 06/02/2017   Iron deficiency anemia, unspecified 04/23/2017   GAD (generalized anxiety disorder) 04/23/2017   Insomnia 04/23/2017   S/P right TH revision 09/24/2016   Medication refused- (any cholesterol ones) 01/20/2016   Environmental and seasonal allergies 01/20/2016   Chronic fatigue syndrome with fibromyalgia 01/20/2016   Vitamin D deficiency 01/19/2016   Prediabetes- 6.0 12/17 01/11/2016   Adjustment disorder with mixed anxiety and depressed mood 01/11/2016   Medication intolerance- statins- (make her FM much W) 01/10/2016   Depression 11/24/2015   Reaction, situational, acute, to stress(husband dying) 09/21/2015   B12 deficiency 08/30/2015   Dysuria 08/18/2015   Chronic Fatigue- 2ndary to FM and Mood d/o  06/20/2015   Memory loss    Lumbar radiculopathy, chronic    Herpes simplex labialis 04/03/2012   Shingles 04/12/2011   LBBB (left bundle branch block) 11/13/2010   MIGRAINE HEADACHE 05/30/2010   Iron deficiency anemia 11/03/2009   Asthma, mild persistent 12/19/2008   Hyperlipidemia 08/12/2007   Anxiety state 08/12/2007   Essential hypertension 08/12/2007   Allergic rhinitis 08/12/2007   G E R D 08/12/2007   ARTHRITIS 08/12/2007   Muscle pain, fibromyalgia 08/12/2007    Past Medical history, Surgical history, Family history, Social history, Allergies and Medications have been entered into the medical record, reviewed and changed as needed.    Current Meds  Medication Sig   acetaminophen (TYLENOL) 500 MG tablet Take by mouth as needed.   albuterol (PROAIR HFA) 108 (90 Base) MCG/ACT inhaler Inhale 2 puffs into the lungs every 6 (  six) hours as needed.    ALPRAZolam (XANAX) 0.25 MG tablet Take 1 tablet (0.25 mg total) by mouth 3 (three) times daily as needed for anxiety.   amLODipine (NORVASC) 5 MG tablet Take 5 mg by mouth daily.   budesonide-formoterol (SYMBICORT) 80-4.5 MCG/ACT inhaler Inhale 2 puffs into the lungs 2 (two) times daily.   busPIRone (BUSPAR) 7.5 MG tablet Take 1 tablet (7.5 mg total) by mouth 3 (three) times daily.   cetirizine (ZYRTEC) 10 MG tablet Take 10 mg by mouth daily.    chlorthalidone (HYGROTON) 25 MG tablet TAKE 1/2 TABLET BY MOUTH DAILY   Cholecalciferol (VITAMIN D3) 5000 units TABS 5,000 IU OTC vitamin D3 daily.   docusate sodium (COLACE) 100 MG capsule Take 1 capsule (100 mg total) by mouth 2 (two) times daily. (Patient taking differently: Take 100 mg by mouth as needed. )   DULoxetine (CYMBALTA) 30 MG capsule TAKE 1 CAPSULE BY MOUTH DAILY   ferrous sulfate (FERROUSUL) 325 (65 FE) MG tablet Take 1 tablet (325 mg total) by mouth 3 (three) times daily with meals. (Patient taking differently: Take 325 mg by mouth 2 (two) times daily with a meal. )   fluticasone (FLONASE) 50 MCG/ACT nasal spray USE 2 SPRAYS INTO EACH NOSTRIL EVERY DAY   losartan (COZAAR) 100 MG tablet TAKE 1 TABLET BY MOUTH DAILY   montelukast (SINGULAIR) 10 MG tablet Take 1 tablet (10 mg total) by mouth at bedtime.   omeprazole (PRILOSEC) 20 MG capsule TAKE 1 CAPSULE BY MOUTH DAILY   traMADol (ULTRAM) 50 MG tablet Take by mouth as needed.   vitamin B-12 (CYANOCOBALAMIN) 1000 MCG tablet Take 1 tablet (1,000 mcg total) by mouth daily.    Allergies:  Allergies  Allergen Reactions   Morphine Sulfate Other (See Comments)    Makes her hyper     Review of Systems: Review of Systems: General:   No F/C, wt loss Pulm:   No DIB, SOB, pleuritic chest pain Card:  No CP, palpitations Abd:  No n/v/d or pain Ext:  No inc edema from baseline Psych: no SI/ HI    Objective:   Blood pressure 119/67, pulse 92, temperature 98.4 F (36.9 C),  temperature source Oral, resp. rate 10, height 5' 1.81" (1.57 m), weight 138 lb (62.6 kg), SpO2 97 %. Body mass index is 25.4 kg/m. General:  Well Developed, well nourished, appropriate for stated age.  Neuro:  Alert and oriented,  extra-ocular muscles intact  HEENT:  Normocephalic, atraumatic, neck supple, no carotid bruits appreciated  Skin:  no gross rash, warm, pink. Cardiac:  RRR, S1 S2 Respiratory:  ECTA B/L and A/P, Not using accessory muscles, speaking in full sentences- unlabored. Vascular:  Ext warm, no cyanosis apprec.; cap RF less 2 sec. Psych:  No HI/SI, judgement and insight good, Euthymic mood. Full Affect.

## 2019-02-04 ENCOUNTER — Telehealth: Payer: Self-pay | Admitting: Family Medicine

## 2019-02-04 ENCOUNTER — Other Ambulatory Visit: Payer: Self-pay | Admitting: Pulmonary Disease

## 2019-02-04 NOTE — Telephone Encounter (Signed)
Patient recently started taking Lexapro 5mg  and is complaining of dizziness. Would like to know if she would d/c medication or decrease the amount of med. Please advise.

## 2019-02-04 NOTE — Telephone Encounter (Signed)
Good Afternoon [name], Dizziness is listed as a potential SE, occurring 4-7% in patients taking Lexapro. Often SE will decrease after time, however she can take 1/2 of Lexapro 5mg  for 1 one week, then go back up to full tablet to see if that decreases the dizziness. Please tell her to remain well hydrated. Sincerely, Valetta Fuller

## 2019-02-05 NOTE — Telephone Encounter (Signed)
Patient is aware of this and verbalized understanding. AS< CMA

## 2019-02-22 ENCOUNTER — Other Ambulatory Visit: Payer: Self-pay | Admitting: Internal Medicine

## 2019-03-01 NOTE — Telephone Encounter (Signed)
Dr. Tamala Julian, patient saw you on 01/29/2019 and is to come back in 6 months. Patient requesting anxiety medication refill.

## 2019-03-01 NOTE — Telephone Encounter (Signed)
She can have.

## 2019-03-02 NOTE — Telephone Encounter (Signed)
Called and confirmed with pharmacy the receipt of Rx. Pharmacist reports it is too early to fill but that the refills are available.

## 2019-03-05 ENCOUNTER — Other Ambulatory Visit: Payer: Self-pay | Admitting: Family Medicine

## 2019-03-05 DIAGNOSIS — F4323 Adjustment disorder with mixed anxiety and depressed mood: Secondary | ICD-10-CM

## 2019-03-05 DIAGNOSIS — R5382 Chronic fatigue, unspecified: Secondary | ICD-10-CM

## 2019-03-05 DIAGNOSIS — K219 Gastro-esophageal reflux disease without esophagitis: Secondary | ICD-10-CM

## 2019-03-15 ENCOUNTER — Ambulatory Visit (INDEPENDENT_AMBULATORY_CARE_PROVIDER_SITE_OTHER): Payer: Medicare Other | Admitting: Psychology

## 2019-03-15 DIAGNOSIS — F4323 Adjustment disorder with mixed anxiety and depressed mood: Secondary | ICD-10-CM

## 2019-03-29 ENCOUNTER — Other Ambulatory Visit: Payer: Medicare Other

## 2019-03-29 ENCOUNTER — Other Ambulatory Visit: Payer: Self-pay

## 2019-03-29 DIAGNOSIS — R7303 Prediabetes: Secondary | ICD-10-CM

## 2019-03-29 DIAGNOSIS — M797 Fibromyalgia: Secondary | ICD-10-CM

## 2019-03-29 DIAGNOSIS — R5382 Chronic fatigue, unspecified: Secondary | ICD-10-CM

## 2019-03-29 DIAGNOSIS — D509 Iron deficiency anemia, unspecified: Secondary | ICD-10-CM

## 2019-03-29 DIAGNOSIS — I10 Essential (primary) hypertension: Secondary | ICD-10-CM

## 2019-03-29 DIAGNOSIS — E538 Deficiency of other specified B group vitamins: Secondary | ICD-10-CM

## 2019-03-29 DIAGNOSIS — E559 Vitamin D deficiency, unspecified: Secondary | ICD-10-CM

## 2019-03-30 LAB — VITAMIN D 25 HYDROXY (VIT D DEFICIENCY, FRACTURES): Vit D, 25-Hydroxy: 47.7 ng/mL (ref 30.0–100.0)

## 2019-03-30 LAB — CBC WITH DIFFERENTIAL/PLATELET
Basophils Absolute: 0 10*3/uL (ref 0.0–0.2)
Basos: 1 %
EOS (ABSOLUTE): 0.1 10*3/uL (ref 0.0–0.4)
Eos: 1 %
Hematocrit: 42.3 % (ref 34.0–46.6)
Hemoglobin: 14.4 g/dL (ref 11.1–15.9)
Immature Grans (Abs): 0 10*3/uL (ref 0.0–0.1)
Immature Granulocytes: 0 %
Lymphocytes Absolute: 1.2 10*3/uL (ref 0.7–3.1)
Lymphs: 16 %
MCH: 30.8 pg (ref 26.6–33.0)
MCHC: 34 g/dL (ref 31.5–35.7)
MCV: 91 fL (ref 79–97)
Monocytes Absolute: 0.6 10*3/uL (ref 0.1–0.9)
Monocytes: 7 %
Neutrophils Absolute: 5.7 10*3/uL (ref 1.4–7.0)
Neutrophils: 75 %
Platelets: 232 10*3/uL (ref 150–450)
RBC: 4.67 x10E6/uL (ref 3.77–5.28)
RDW: 12.4 % (ref 11.7–15.4)
WBC: 7.6 10*3/uL (ref 3.4–10.8)

## 2019-03-30 LAB — HEMOGLOBIN A1C
Est. average glucose Bld gHb Est-mCnc: 126 mg/dL
Hgb A1c MFr Bld: 6 % — ABNORMAL HIGH (ref 4.8–5.6)

## 2019-03-30 LAB — LIPID PANEL
Chol/HDL Ratio: 4.4 ratio (ref 0.0–4.4)
Cholesterol, Total: 337 mg/dL — ABNORMAL HIGH (ref 100–199)
HDL: 76 mg/dL (ref >39)
LDL Chol Calc (NIH): 246 mg/dL — ABNORMAL HIGH (ref 0–99)
Triglycerides: 95 mg/dL (ref 0–149)
VLDL Cholesterol Cal: 15 mg/dL (ref 5–40)

## 2019-03-30 LAB — COMPREHENSIVE METABOLIC PANEL
ALT: 16 IU/L (ref 0–32)
AST: 15 IU/L (ref 0–40)
Albumin/Globulin Ratio: 2.7 — ABNORMAL HIGH (ref 1.2–2.2)
Albumin: 4.9 g/dL — ABNORMAL HIGH (ref 3.6–4.6)
Alkaline Phosphatase: 71 IU/L (ref 39–117)
BUN/Creatinine Ratio: 19 (ref 12–28)
BUN: 14 mg/dL (ref 8–27)
Bilirubin Total: 0.5 mg/dL (ref 0.0–1.2)
CO2: 27 mmol/L (ref 20–29)
Calcium: 9.5 mg/dL (ref 8.7–10.3)
Chloride: 93 mmol/L — ABNORMAL LOW (ref 96–106)
Creatinine, Ser: 0.75 mg/dL (ref 0.57–1.00)
GFR calc Af Amer: 85 mL/min/{1.73_m2} (ref >59)
GFR calc non Af Amer: 74 mL/min/{1.73_m2} (ref >59)
Globulin, Total: 1.8 g/dL (ref 1.5–4.5)
Glucose: 127 mg/dL — ABNORMAL HIGH (ref 65–99)
Potassium: 4.3 mmol/L (ref 3.5–5.2)
Sodium: 136 mmol/L (ref 134–144)
Total Protein: 6.7 g/dL (ref 6.0–8.5)

## 2019-03-30 LAB — T4, FREE: Free T4: 1.11 ng/dL (ref 0.82–1.77)

## 2019-03-30 LAB — VITAMIN B12: Vitamin B-12: 1712 pg/mL — ABNORMAL HIGH (ref 232–1245)

## 2019-03-30 LAB — TSH: TSH: 2.24 u[IU]/mL (ref 0.450–4.500)

## 2019-03-30 LAB — T3: T3, Total: 116 ng/dL (ref 71–180)

## 2019-04-05 ENCOUNTER — Encounter: Payer: Self-pay | Admitting: Family Medicine

## 2019-04-05 ENCOUNTER — Ambulatory Visit (INDEPENDENT_AMBULATORY_CARE_PROVIDER_SITE_OTHER): Payer: Medicare Other | Admitting: Family Medicine

## 2019-04-05 ENCOUNTER — Other Ambulatory Visit: Payer: Self-pay

## 2019-04-05 VITALS — BP 132/87 | HR 84 | Ht 61.0 in | Wt 126.5 lb

## 2019-04-05 DIAGNOSIS — E782 Mixed hyperlipidemia: Secondary | ICD-10-CM

## 2019-04-05 DIAGNOSIS — E559 Vitamin D deficiency, unspecified: Secondary | ICD-10-CM | POA: Diagnosis not present

## 2019-04-05 DIAGNOSIS — I10 Essential (primary) hypertension: Secondary | ICD-10-CM

## 2019-04-05 DIAGNOSIS — M797 Fibromyalgia: Secondary | ICD-10-CM

## 2019-04-05 DIAGNOSIS — E538 Deficiency of other specified B group vitamins: Secondary | ICD-10-CM | POA: Diagnosis not present

## 2019-04-05 DIAGNOSIS — F411 Generalized anxiety disorder: Secondary | ICD-10-CM | POA: Diagnosis not present

## 2019-04-05 DIAGNOSIS — F410 Panic disorder [episodic paroxysmal anxiety] without agoraphobia: Secondary | ICD-10-CM | POA: Diagnosis not present

## 2019-04-05 DIAGNOSIS — Z5320 Procedure and treatment not carried out because of patient's decision for unspecified reasons: Secondary | ICD-10-CM | POA: Diagnosis not present

## 2019-04-05 DIAGNOSIS — F430 Acute stress reaction: Secondary | ICD-10-CM | POA: Diagnosis not present

## 2019-04-05 DIAGNOSIS — F4323 Adjustment disorder with mixed anxiety and depressed mood: Secondary | ICD-10-CM

## 2019-04-05 DIAGNOSIS — R7303 Prediabetes: Secondary | ICD-10-CM

## 2019-04-05 MED ORDER — VITAMIN B-12 1000 MCG PO TABS: 1000.0000 ug | ORAL_TABLET | ORAL | 3 refills | Status: DC

## 2019-04-05 MED ORDER — ROSUVASTATIN CALCIUM 5 MG PO TABS: 5.0000 mg | ORAL_TABLET | Freq: Every day | ORAL | 0 refills | Status: DC

## 2019-04-05 MED ORDER — CYANOCOBALAMIN 2500 MCG PO TABS: 1250.0000 ug | ORAL_TABLET | Freq: Every day | ORAL | Status: DC

## 2019-04-05 NOTE — Progress Notes (Signed)
Telehealth office visit note for [name], D.O- at Primary Care at [facility]   I connected with current patient today and verified that I am speaking with the correct person using two identifiers.   . Location of the patient: Home . Location of the provider: Office Only the patient (+/- their family members at pt's discretion) and myself were participating in the encounter - This visit type was conducted due to national recommendations for restrictions regarding the COVID-19 Pandemic (e.g. social distancing) in an effort to limit this patient's exposure and mitigate transmission in our community.  This format is felt to be most appropriate for this patient at this time.   - The patient did not have access to video technology or had technical difficulties with video requiring transitioning to audio format only. - No physical exam could be performed with this format, beyond that communicated to [name] by the patient/ family members as noted.   - Additionally my office staff/ schedulers discussed with the patient that there may be a monetary charge related to this service, depending on their medical insurance.   The patient expressed understanding, and agreed to proceed.       History of Present Illness: Hypertension and Hyperlipidemia   I, [name], am serving as scribe for Dr. Mellody Barr.  Patient notes she is doing alright.  - Mood Management States her mood has been much better; "I'm taking one Xanax three times per day."  States she's been provided this prescription by Pulmonology.  She takes one Xanax in the morning, one at 2:30 or 3:00 PM, and one when she goes to bed.  States this has "helped tremendously."  States that the pulmonologist did not speak to her regarding side-effects of Xanax; notes she mentioned concerns about side-effects to the pulmonologist and was told by him, "I think in this case you need [the prescription]."  Patient continues Buspar  7.5 mg three times per day.  She also takes Cymbalta and Lexapro.  Notes the Lexapro has "helped tremendously with the depression."  She's lost her sister, her brother in law, her best friend, her dog, has another friend in the hospital right now, and notes "at times I have cried a lot."  She feels the Lexapro has helped with this.  Confirms she is tolerating her medications well and her treatment plan is helping her mood.  She was called by psychology for counseling/therapy; notes she had an hour long conversation with a gentleman and will be speaking with him again on Wednesday.  "He was the one who suggested I call and get an appointment and have the blood work done, to make sure there wasn't something in the blood work that was affecting my mood."  "In my family, I'm the go-to person, and I've just felt like I can't do it anymore."  Her main goal is getting her depression under control.  Notes her weight has been level, around 126 lbs, and her blood pressure has been okay.  Notes she typically walks in the morning for 15 minutes to get the newspaper, and again twice in the afternoon with her dog.  Now that her dog, [name], has passed, she goes out once in the morning, and one longer time in the afternoon.  - B12 Deficiency, Now Elevated Notes she takes her B12 once per day, at a dose of 2500 currently.  - Vitamin D Deficiency She takes 5000 IU's daily.  HPI:  Hypertension:  -  Her blood pressure at home has been running: running in 130's/80's at home.  - Patient reports good compliance with medication and/or lifestyle modification  - Her denies acute concerns or problems related to treatment plan  - She denies new onset of: chest pain, exercise intolerance, shortness of breath, dizziness, visual changes, headache, lower extremity swelling or claudication.   Last 3 blood pressure readings in our office are as follows: BP Readings from Last 3 Encounters:  04/05/19 132/87  02/01/19  119/67  01/29/19 118/78   Filed Weights   04/05/19 1305  Weight: 126 lb 8 oz (57.4 kg)   HPI:  Hyperlipidemia:  86 y.o. female here for cholesterol follow-up.   - States she hasn't changed her diet or activity since last check.  She can only walk with a cane due to her scoliosis.  Notes she walks twice per day, "and that's about all I can do."  Notes cholesterol medication made her "ache all over" in the past, and she currently aches all over from arthritis.  Notes "I will try cholesterol meds, but I won't guarantee that I'll keep taking it if the pain gets worse."  - She denies new onset of: myalgias, arthralgias, increased fatigue more than normal, chest pains, exercise intolerance, shortness of breath, dizziness, visual changes, headache, lower extremity swelling or claudication.   Most recent cholesterol panel was:  Lab Results  Component Value Date   CHOL 337 (H) 03/29/2019   HDL 76 03/29/2019   LDLCALC 246 (H) 03/29/2019   LDLDIRECT 195.1 04/12/2011   TRIG 95 03/29/2019   CHOLHDL 4.4 03/29/2019   Hepatic Function Latest Ref Rng & Units 03/29/2019 10/01/2017 10/01/2017  Total Protein 6.0 - 8.5 g/dL 6.7 6.9 7.0  Albumin 3.6 - 4.6 g/dL 4.9(H) - -  AST 0 - 40 IU/L 15 - 17  ALT 0 - 32 IU/L 16 - 17  Alk Phosphatase 39 - 117 IU/L 71 - -  Total Bilirubin 0.0 - 1.2 mg/dL 0.5 - 0.4  Bilirubin, Direct 0.0 - 0.3 mg/dL - - -        GAD 7 : Generalized Anxiety Score 02/01/2019 09/08/2018 05/27/2018 10/23/2017  Nervous, Anxious, on Edge '3 1 1 1  ' Control/stop worrying '2 1 1 3  ' Worry too much - different things 3 0 1 3  Trouble relaxing '3 3 1 3  ' Restless 3 0 1 0  Easily annoyed or irritable 2 0 1 1  Afraid - awful might happen 1 0 - 0  Total GAD 7 Score 17 5 - 11  Anxiety Difficulty Somewhat difficult Not difficult at all - -    Depression screen [name] 2/9 04/05/2019 02/01/2019 09/08/2018 05/27/2018 04/20/2018  Decreased Interest '1 2 1 1 ' 0  Down, Depressed, Hopeless '1 2 1 1 1  ' PHQ - 2  Score '2 4 2 2 1  ' Altered sleeping 0 2 0 0 0  Tired, decreased energy 1 2 0 1 2  Change in appetite 0 2 0 0 0  Feeling bad or failure about yourself  0 1 0 0 0  Trouble concentrating 0 0 0 1 0  Moving slowly or fidgety/restless 0 1 0 1 0  Suicidal thoughts 0 0 0 0 0  PHQ-9 Score '3 12 2 5 3  ' Difficult doing work/chores Somewhat difficult Somewhat difficult Not difficult at all Somewhat difficult Somewhat difficult  Some recent data might be hidden      Impression and Recommendations:    1. Prediabetes- 6.0 12/17  2. Medication refused- (any cholesterol ones)   3. Mixed hyperlipidemia   4. Essential hypertension   5. Adjustment disorder with mixed anxiety and depressed mood   6. Anxiety state   7. Panic attack as reaction to stress   8. Muscle pain, fibromyalgia   9. B12 deficiency   10. Vitamin D deficiency      Adjustment Disorder w/ Mixed Anxiety & Depressed Mood, Panic Attack as Reaction to Stress - Stable at this time. - Depression improved on Lexapro. - Panic improved on Xanax TID. - Xanax for panic is being provided and supervised by pulmonology.  - Patient will continue management as established.  - In addition to prescription intervention and therapy/counseling, reviewed the "spokes of the wheel" of mood and health management.  Stressed the importance of ongoing prudent habits, including regular exercise, appropriate sleep hygiene, healthful dietary habits, and prayer/meditation to relax.  - Encouraged patient to ontinue with counseling/therapist as established.  - Will continue to monitor.  - Reviewed recent lab work (03/28/2018) in depth with patient today.  All lab work within normal limits unless otherwise noted.  Extensive education provided and all questions answered.  Prediabetes - 6.0, down from 6.1 eleven months prior. - Discussed that patient's A1c is currently stable.  - Encouraged patient to continue to avoid carb-rich foods and engage in regular  physical activity.  - We will continue to monitor and re-check as discussed.  Vitamin D Deficiency - Stable at this time. - 47.7, stable from 52.4 prior. - Continue supplementation as established. - Will continue to monitor and re-check as discussed.  Elevated B12 - History of B12 Deficiency - 1,712 last check, elevated. - Advised patient to reduce her dose of B12 supplementation. - Per patient, taking 2500 currently.  To help return B12 WNL, told patient to take her current supplementation every other day, or split her tablets in half.  - Will continue to monitor. Re-check B12 in 3-4 months. - Will continue to reduce dose of supplementation in future if indicated.  Mixed Hyperlipidemia - has historically refused medication Last FLP 7 days ago: - Triglycerides = 95, improved from 158 two years prior. - HDL = 76, up from 66 two years prior. - LDL = 246, elevated from 230 two years prior.  - LDL worsened, Triglycerides and HDL improved. - Discussed that patient's cholesterol levels are not at goal.  - Per patient, willing to try cholesterol medications. - Begin low dose Crestor, 5 mg nightly today.  See med list. - If desired, patient knows she may begin with half tablet nightly or every other night as tolerated.  - To help reduce incidence of S-E on statin, encouraged patient to hydrate adequately, drink plenty of water, and engage in as much physical activity as tolerated.  - Prudent dietary changes such as low saturated & trans fat diets for hyperlipidemia and low carb diets for hypertriglyceridemia discussed with patient.    - To improve HDL, encouraged patient to follow AHA guidelines for regular exercise and also engage in weight loss if BMI above 25.   - Educational handouts provided at patient's desire and/ or told to look online at the [employer] website for further information.  - We will continue to monitor and re-check as discussed.  Essential  Hypertension - Blood pressure currently is stable, at goal. - Patient will continue current treatment regimen.  See med list.  - Counseled patient on pathophysiology of disease and discussed various treatment options, which always  includes dietary and lifestyle modification as first line.   - Lifestyle changes such as dash and heart healthy diets and engaging in a regular exercise program discussed extensively with patient.   - Ambulatory blood pressure monitoring encouraged at least 3 times weekly.  Keep log and bring in every office visit.  Reminded patient that if they ever feel poorly in any way, to check their blood pressure and pulse.  - We will continue to monitor.  Health Counseling & Preventative Maintenance - Advised patient to continue working toward exercising to improve overall mental, physical, and emotional health.    - Encouraged patient to engage in daily physical activity as tolerated, especially a formal exercise routine.  Recommended that the patient eventually strive for at least 150 minutes of moderate cardiovascular activity per week according to guidelines established by the [facility]   - Healthy dietary habits encouraged, including low-carb, and high amounts of lean protein in diet.   - Patient should also consume adequate amounts of water.  - Health counseling performed.  All questions answered.  Recommendations - Re-check liver enzyme in 6 weeks. - Re-check B12 and FLP in 3-4 months.    - As part of my medical decision making, I reviewed the following data within the [place] History obtained from pt /family, CMA notes reviewed and incorporated if applicable, Labs reviewed, Radiograph/ tests reviewed if applicable and OV notes from prior OV's with me, as well as other specialists she/he has seen since seeing me last, were all reviewed and used in my medical decision making process today.    - Additionally, discussion had with patient regarding  our treatment plan, and their biases/concerns about that plan were used in my medical decision making today.    - The patient agreed with the plan and demonstrated an understanding of the instructions.   No barriers to understanding were identified.    - Red flag symptoms and signs discussed in detail.  Patient expressed understanding regarding what to do in case of emergency\ urgent symptoms.   - The patient was advised to call back or seek an in-person evaluation if the symptoms worsen or if the condition fails to improve as anticipated.   Return for re-check liver enzymes in 6 weeks, FLP and B12 in 3 months with OV 3-5 days later.    Orders Placed This Encounter  Procedures  . Vit B12    Meds ordered this encounter  Medications  . DISCONTD: vitamin B-12 (CYANOCOBALAMIN) 1000 MCG tablet    Sig: Take 1 tablet (1,000 mcg total) by mouth every other day.    Dispense:  90 tablet    Refill:  3  . Cyanocobalamin (VITAMIN B-12) 2500 MCG TABS    Sig: Take 1,250 mcg by mouth daily.    Medications Discontinued During This Encounter  Medication Reason  . predniSONE (DELTASONE) 5 MG tablet Error  . vitamin B-12 (CYANOCOBALAMIN) 1000 MCG tablet   . vitamin B-12 (CYANOCOBALAMIN) 1000 MCG tablet       I provided 31 minutes of non face-to-face time during this encounter.  Additional time was spent with charting and coordination of care before and after the actual visit commenced.   Note:  This note was prepared with assistance of Dragon voice recognition software. Occasional wrong-word or sound-a-like substitutions may have occurred due to the inherent limitations of voice recognition software.  This document serves as a record of services personally performed by Sonya Dance, DO. It was created on her  behalf by [name], a trained medical scribe. The creation of this record is based on the scribe's personal observations and the provider's statements to them.   This case  required medical decision making of at least moderate complexity. The above documentation has been reviewed to be accurate and was completed by [name], D.O.       Patient Care Team    Relationship Specialty Notifications Start End  Sonya Dance, DO PCP - General Family Medicine  08/30/15   Ladene Artist, MD Referring Physician Gastroenterology  07/02/10   Myrlene Broker, MD Referring Physician Urology  07/02/10   Clent Jacks, MD Referring Physician Ophthalmology  07/02/10   [name], MD Referring Physician Cardiology  07/02/10   Danella Sensing, MD Consulting Physician Dermatology  11/09/15   Juanito Doom, MD Consulting Physician Pulmonary Disease  11/09/15   Suella Broad, MD Consulting Physician Physical Medicine and Rehabilitation  11/09/15    Comment: pain mgt :  injections and pain meds  Rockne Menghini, RPH-CPP Pharmacist Cardiology  11/19/15    Comment:  " [facility]" - med mgt of her HTN per request of Dr Alvester Chou- Cards  Alda Berthold, DO Consulting Physician Neurology  12/17/15   Harrington Challenger, [facility]  Pharmacist  07/29/16    Comment: clinical pharm-D in HTN clinic  Paralee Cancel, MD Consulting Physician Orthopedic Surgery  10/23/17    Comment: R hip replacement  Bo Merino, MD Consulting Physician Rheumatology  10/23/17   Melina Schools, MD Consulting Physician Orthopedic Surgery  04/20/18   Candee Furbish, MD Consulting Physician Pulmonary Disease  02/01/19    Comment: Taking over for Dr. Lake Bells     -Vitals obtained; medications/ allergies reconciled;  personal medical, social, Sx etc.histories were updated by CMA, reviewed by me and are reflected in chart   Patient Active Problem List   Diagnosis Date Noted  . Panic attack as reaction to stress 04/05/2019  . Medication management 12/29/2018  .  Muscle spasms of back due to scoliosis 05/27/2018  . Chronic back pain 05/27/2018  . Atypical chest pain 02/17/2018  . Primary  osteoarthritis of both hands 10/21/2017  . DDD (degenerative disc disease), lumbar 10/21/2017  . History of COPD 10/01/2017  . Former smoker 10/01/2017  . Pain in joint of right shoulder 06/25/2017  . Trochanteric bursitis of right hip 06/25/2017  . Primary [name] disease without gangrene-  hands and feet 06/02/2017  . Iron deficiency anemia, unspecified 04/23/2017  . GAD (generalized anxiety disorder) 04/23/2017  . Insomnia 04/23/2017  . S/P right TH revision 09/24/2016  . Medication refused- (any cholesterol ones) 01/20/2016  . Environmental and seasonal allergies 01/20/2016  . Chronic fatigue syndrome with fibromyalgia 01/20/2016  . Vitamin D deficiency 01/19/2016  . Prediabetes- 6.0 12/17 01/11/2016  . Adjustment disorder with mixed anxiety and depressed mood 01/11/2016  . Medication intolerance- statins- (make her FM much W) 01/10/2016  . Depression 11/24/2015  . Reaction, situational, acute, to stress(husband dying) 09/21/2015  . B12 deficiency 08/30/2015  . Dysuria 08/18/2015  . Chronic Fatigue- 2ndary to FM and Mood d/o  06/20/2015  . Memory loss   . Lumbar radiculopathy, chronic   . Herpes simplex labialis 04/03/2012  . Shingles 04/12/2011  . LBBB (left bundle branch block) 11/13/2010  . MIGRAINE HEADACHE 05/30/2010  . Iron deficiency anemia 11/03/2009  . Asthma, mild persistent 12/19/2008  . Hyperlipidemia 08/12/2007  . Anxiety state 08/12/2007  . Essential hypertension 08/12/2007  .  Allergic rhinitis 08/12/2007  . G E R D 08/12/2007  . ARTHRITIS 08/12/2007  . Muscle pain, fibromyalgia 08/12/2007     Current Meds  Medication Sig  . acetaminophen (TYLENOL) 500 MG tablet Take by mouth as needed.  Marland Kitchen albuterol (PROAIR HFA) 108 (90 Base) MCG/ACT inhaler Inhale 2 puffs into the lungs every 6 (six) hours as needed.  . ALPRAZolam (XANAX) 0.25 MG tablet TAKE 1 TABLET BY MOUTH THREE TIMES DAILYAS NEEDED FOR ANXIETY  . amLODipine (NORVASC) 5 MG tablet Take 5 mg by  mouth daily.  . budesonide-formoterol (SYMBICORT) 80-4.5 MCG/ACT inhaler Inhale 2 puffs into the lungs 2 (two) times daily.  . busPIRone (BUSPAR) 7.5 MG tablet Take 1 tablet (7.5 mg total) by mouth 3 (three) times daily.  . cetirizine (ZYRTEC) 10 MG tablet Take 10 mg by mouth daily.   . chlorthalidone (HYGROTON) 25 MG tablet TAKE 1/2 TABLET BY MOUTH DAILY  . Cholecalciferol (VITAMIN D3) 5000 units TABS 5,000 IU OTC vitamin D3 daily.  . Cyanocobalamin (VITAMIN B-12) 2500 MCG TABS Take 1,250 mcg by mouth daily.  Marland Kitchen docusate sodium (COLACE) 100 MG capsule Take 1 capsule (100 mg total) by mouth 2 (two) times daily. (Patient taking differently: Take 100 mg by mouth as needed. )  . DULoxetine (CYMBALTA) 30 MG capsule TAKE 1 CAPSULE BY MOUTH DAILY  . escitalopram (LEXAPRO) 5 MG tablet Take 1 tablet (5 mg total) by mouth at bedtime.  . ferrous sulfate (FERROUSUL) 325 (65 FE) MG tablet Take 1 tablet (325 mg total) by mouth 3 (three) times daily with meals. (Patient taking differently: Take 325 mg by mouth 2 (two) times daily with a meal. )  . fluticasone (FLONASE) 50 MCG/ACT nasal spray USE 2 SPRAYS INTO EACH NOSTRIL EVERY DAY  . losartan (COZAAR) 100 MG tablet TAKE 1 TABLET BY MOUTH DAILY  . methocarbamol (ROBAXIN) 500 MG tablet as needed.  . montelukast (SINGULAIR) 10 MG tablet TAKE 1 TABLET BY MOUTH DAILY AT BEDTIME  . omeprazole (PRILOSEC) 20 MG capsule TAKE 1 CAPSULE BY MOUTH DAILY  . [DISCONTINUED] vitamin B-12 (CYANOCOBALAMIN) 1000 MCG tablet Take 1 tablet (1,000 mcg total) by mouth daily.  . [DISCONTINUED] vitamin B-12 (CYANOCOBALAMIN) 1000 MCG tablet Take 1 tablet (1,000 mcg total) by mouth every other day.     Allergies:  Allergies  Allergen Reactions  . Morphine Sulfate Other (See Comments)    Makes her hyper     ROS:  See above HPI for pertinent positives and negatives   Objective:   Blood pressure 132/87, pulse 84, height '5\' 1"'  (1.549 m), weight 126 lb 8 oz (57.4 kg).  (if some  vitals are omitted, this means that patient was UNABLE to obtain them even though they were asked to get them prior to OV today.  They were asked to call us at their earliest convenience with these once obtained. )  General: A & O * 3; sounds in no acute distress; in usual state of health.  Skin: Pt confirms warm and dry extremities and pink fingertips HEENT: Pt confirms lips non-cyanotic Chest: Patient confirms normal chest excursion and movement Respiratory: speaking in full sentences, no conversational dyspnea; patient confirms no use of accessory muscles Psych: insight appears good, mood- appears full

## 2019-04-05 NOTE — Patient Instructions (Addendum)
Ms. Mccauley,   Please cut your B12 tab in half  And take 1/2 tab daily  We are starting you on Crestor at 5 mg nightly which is what the prescription states.   However, if you would like to take a half a tablet and start with just half a tablet every other night for a couple of weeks to ensure you do well with it and then increase to half a tablet every night after a couple weeks and then eventually a couple weeks later go to 5 mg every night that is fine to do.    Guidelines for a Low Cholesterol, Low Saturated Fat Diet  Fats - Limit total intake of fats and oils. - Avoid butter, stick margarine, shortening, lard, palm and coconut oils. - Limit mayonnaise, salad dressings, gravies and sauces, unless they are homemade with low-fat ingredients. - Limit chocolate. - Choose low-fat and nonfat products, such as low-fat mayonnaise, low-fat or non-hydrogenated peanut butter, low-fat or fat-free salad dressings and nonfat gravy. - Use vegetable oil, such as canola or olive oil. - Look for margarine that does not contain trans fatty acids. - Use nuts in moderate amounts. - Read ingredient labels carefully to determine both amount and type of fat present in foods. Limit saturated and trans fats! - Avoid high-fat processed and convenience foods.  Meats and Meat Alternatives - Choose fish, chicken, [place] and lean meats. - Use dried beans, peas, lentils and tofu. - Limit egg yolks to three to four per week. - If you eat red meat, limit to no more than three servings per week and choose loin or round cuts. - Avoid fatty meats, such as bacon, sausage, franks, luncheon meats and ribs. - Avoid all organ meats, including liver.  Dairy - Choose nonfat or low-fat milk, yogurt and cottage cheese. - Most cheeses are high in fat. Choose cheeses made from non-fat milk, such as mozzarella and ricotta cheese. - Choose light or fat-free cream cheese and sour cream. - Avoid cream and sauces made with  cream.  Fruits and Vegetables - Eat a wide variety of fruits and vegetables. - Use lemon juice, vinegar or "mist" olive oil on vegetables. - Avoid adding sauces, fat or oil to vegetables.  Breads, Cereals and Grains - Choose whole-grain breads, cereals, pastas and rice. - Avoid high-fat snack foods, such as granola, cookies, pies, pastries, doughnuts and croissants.  Cooking Tips - Avoid deep fried foods. - Trim visible fat off meats and remove skin from poultry before cooking. - Bake, broil, boil, poach or roast poultry, fish and lean meats. - Drain and discard fat that drains out of meat as you cook it. - Add little or no fat to foods. - Use vegetable oil sprays to grease pans for cooking or baking. - Steam vegetables. - Use herbs or no-oil marinades to flavor foods.

## 2019-04-07 ENCOUNTER — Ambulatory Visit (INDEPENDENT_AMBULATORY_CARE_PROVIDER_SITE_OTHER): Payer: Medicare Other | Admitting: Psychology

## 2019-04-07 DIAGNOSIS — F4323 Adjustment disorder with mixed anxiety and depressed mood: Secondary | ICD-10-CM | POA: Diagnosis not present

## 2019-04-09 DIAGNOSIS — M961 Postlaminectomy syndrome, not elsewhere classified: Secondary | ICD-10-CM | POA: Diagnosis not present

## 2019-04-09 DIAGNOSIS — M5416 Radiculopathy, lumbar region: Secondary | ICD-10-CM | POA: Diagnosis not present

## 2019-04-12 ENCOUNTER — Ambulatory Visit: Payer: Medicare Other | Admitting: Family Medicine

## 2019-04-13 ENCOUNTER — Other Ambulatory Visit: Payer: Self-pay | Admitting: Family Medicine

## 2019-04-13 ENCOUNTER — Other Ambulatory Visit: Payer: Self-pay | Admitting: Cardiovascular Disease

## 2019-04-13 DIAGNOSIS — F329 Major depressive disorder, single episode, unspecified: Secondary | ICD-10-CM

## 2019-04-13 DIAGNOSIS — F32A Depression, unspecified: Secondary | ICD-10-CM

## 2019-04-13 DIAGNOSIS — F411 Generalized anxiety disorder: Secondary | ICD-10-CM

## 2019-04-15 ENCOUNTER — Ambulatory Visit: Payer: Medicare Other | Attending: Internal Medicine

## 2019-04-15 DIAGNOSIS — Z23 Encounter for immunization: Secondary | ICD-10-CM | POA: Insufficient documentation

## 2019-04-15 NOTE — Progress Notes (Signed)
   Covid-19 Vaccination Clinic  Name:  [name]    MRN: [id number] DOB: May 10, 1933  04/15/2019  [name] was observed post Covid-19 immunization for 15 minutes without incidence. She was provided with Vaccine Information Sheet and instruction to access the V-Safe system.   [name] was instructed to call 911 with any severe reactions post vaccine: Marland Kitchen Difficulty breathing  . Swelling of your face and throat  . A fast heartbeat  . A bad rash all over your body  . Dizziness and weakness    Immunizations Administered    Name Date Dose VIS Date Route   Pfizer COVID-19 Vaccine 04/15/2019  2:47 PM 0.3 mL 03/05/2019 Intramuscular   Manufacturer: [place]   Lot: [id number]   [place]: [id number]

## 2019-04-16 ENCOUNTER — Other Ambulatory Visit: Payer: Self-pay | Admitting: Cardiovascular Disease

## 2019-04-16 MED ORDER — AMLODIPINE BESYLATE 5 MG PO TABS: 5.0000 mg | ORAL_TABLET | Freq: Every day | ORAL | 1 refills | Status: DC

## 2019-04-23 DIAGNOSIS — M7742 Metatarsalgia, left foot: Secondary | ICD-10-CM

## 2019-04-23 DIAGNOSIS — M79672 Pain in left foot: Secondary | ICD-10-CM | POA: Diagnosis not present

## 2019-04-23 DIAGNOSIS — L97509 Non-pressure chronic ulcer of other part of unspecified foot with unspecified severity: Secondary | ICD-10-CM | POA: Insufficient documentation

## 2019-04-23 DIAGNOSIS — L97529 Non-pressure chronic ulcer of other part of left foot with unspecified severity: Secondary | ICD-10-CM | POA: Diagnosis not present

## 2019-04-23 DIAGNOSIS — M2042 Other hammer toe(s) (acquired), left foot: Secondary | ICD-10-CM | POA: Diagnosis not present

## 2019-04-23 HISTORY — DX: Metatarsalgia, left foot: M77.42

## 2019-04-23 HISTORY — DX: Non-pressure chronic ulcer of other part of unspecified foot with unspecified severity: L97.509

## 2019-05-03 ENCOUNTER — Ambulatory Visit: Payer: Medicare Other | Attending: Internal Medicine

## 2019-05-03 DIAGNOSIS — Z23 Encounter for immunization: Secondary | ICD-10-CM | POA: Insufficient documentation

## 2019-05-03 NOTE — Progress Notes (Signed)
   Covid-19 Vaccination Clinic  Name:  [name]    MRN: [id number] DOB: 20-Nov-1933  05/03/2019  [name] was observed post Covid-19 immunization for 15 minutes without incidence. She was provided with Vaccine Information Sheet and instruction to access the V-Safe system.   [name] was instructed to call 911 with any severe reactions post vaccine: Marland Kitchen Difficulty breathing  . Swelling of your face and throat  . A fast heartbeat  . A bad rash all over your body  . Dizziness and weakness    Immunizations Administered    Name Date Dose VIS Date Route   Pfizer COVID-19 Vaccine 05/03/2019 10:43 AM 0.3 mL 03/05/2019 Intramuscular   Manufacturer: [place]   Lot: [id number]   [place]: [id number]

## 2019-05-04 ENCOUNTER — Other Ambulatory Visit: Payer: Self-pay | Admitting: Family Medicine

## 2019-05-04 DIAGNOSIS — F411 Generalized anxiety disorder: Secondary | ICD-10-CM

## 2019-05-04 DIAGNOSIS — I10 Essential (primary) hypertension: Secondary | ICD-10-CM

## 2019-05-05 ENCOUNTER — Ambulatory Visit (INDEPENDENT_AMBULATORY_CARE_PROVIDER_SITE_OTHER): Payer: Medicare Other | Admitting: Psychology

## 2019-05-05 DIAGNOSIS — F4323 Adjustment disorder with mixed anxiety and depressed mood: Secondary | ICD-10-CM

## 2019-05-07 DIAGNOSIS — M2042 Other hammer toe(s) (acquired), left foot: Secondary | ICD-10-CM | POA: Diagnosis not present

## 2019-05-07 DIAGNOSIS — M7742 Metatarsalgia, left foot: Secondary | ICD-10-CM | POA: Diagnosis not present

## 2019-05-07 DIAGNOSIS — L97529 Non-pressure chronic ulcer of other part of left foot with unspecified severity: Secondary | ICD-10-CM | POA: Diagnosis not present

## 2019-05-18 ENCOUNTER — Other Ambulatory Visit: Payer: Self-pay

## 2019-05-18 ENCOUNTER — Other Ambulatory Visit: Payer: Medicare Other

## 2019-05-18 DIAGNOSIS — E782 Mixed hyperlipidemia: Secondary | ICD-10-CM

## 2019-05-19 LAB — ALT: ALT: 18 IU/L (ref 0–32)

## 2019-05-20 DIAGNOSIS — M5416 Radiculopathy, lumbar region: Secondary | ICD-10-CM | POA: Diagnosis not present

## 2019-05-26 ENCOUNTER — Ambulatory Visit: Payer: Medicare Other | Admitting: Psychology

## 2019-06-03 DIAGNOSIS — M7742 Metatarsalgia, left foot: Secondary | ICD-10-CM | POA: Diagnosis not present

## 2019-06-03 DIAGNOSIS — L97529 Non-pressure chronic ulcer of other part of left foot with unspecified severity: Secondary | ICD-10-CM | POA: Diagnosis not present

## 2019-06-03 DIAGNOSIS — M2042 Other hammer toe(s) (acquired), left foot: Secondary | ICD-10-CM | POA: Diagnosis not present

## 2019-06-07 DIAGNOSIS — H40013 Open angle with borderline findings, low risk, bilateral: Secondary | ICD-10-CM | POA: Diagnosis not present

## 2019-06-07 DIAGNOSIS — H04123 Dry eye syndrome of bilateral lacrimal glands: Secondary | ICD-10-CM | POA: Diagnosis not present

## 2019-06-07 DIAGNOSIS — Z961 Presence of intraocular lens: Secondary | ICD-10-CM | POA: Diagnosis not present

## 2019-06-07 DIAGNOSIS — H35372 Puckering of macula, left eye: Secondary | ICD-10-CM | POA: Diagnosis not present

## 2019-06-14 DIAGNOSIS — M9904 Segmental and somatic dysfunction of sacral region: Secondary | ICD-10-CM | POA: Diagnosis not present

## 2019-06-14 DIAGNOSIS — M9905 Segmental and somatic dysfunction of pelvic region: Secondary | ICD-10-CM | POA: Diagnosis not present

## 2019-06-14 DIAGNOSIS — M9903 Segmental and somatic dysfunction of lumbar region: Secondary | ICD-10-CM | POA: Diagnosis not present

## 2019-06-14 DIAGNOSIS — M5136 Other intervertebral disc degeneration, lumbar region: Secondary | ICD-10-CM | POA: Diagnosis not present

## 2019-06-17 DIAGNOSIS — M5136 Other intervertebral disc degeneration, lumbar region: Secondary | ICD-10-CM | POA: Diagnosis not present

## 2019-06-17 DIAGNOSIS — M9904 Segmental and somatic dysfunction of sacral region: Secondary | ICD-10-CM | POA: Diagnosis not present

## 2019-06-17 DIAGNOSIS — M9905 Segmental and somatic dysfunction of pelvic region: Secondary | ICD-10-CM | POA: Diagnosis not present

## 2019-06-17 DIAGNOSIS — M9903 Segmental and somatic dysfunction of lumbar region: Secondary | ICD-10-CM | POA: Diagnosis not present

## 2019-06-18 ENCOUNTER — Other Ambulatory Visit: Payer: Self-pay | Admitting: Family Medicine

## 2019-06-18 DIAGNOSIS — R5382 Chronic fatigue, unspecified: Secondary | ICD-10-CM

## 2019-06-18 DIAGNOSIS — F4323 Adjustment disorder with mixed anxiety and depressed mood: Secondary | ICD-10-CM

## 2019-06-21 DIAGNOSIS — M9904 Segmental and somatic dysfunction of sacral region: Secondary | ICD-10-CM | POA: Diagnosis not present

## 2019-06-21 DIAGNOSIS — M9903 Segmental and somatic dysfunction of lumbar region: Secondary | ICD-10-CM | POA: Diagnosis not present

## 2019-06-21 DIAGNOSIS — M9905 Segmental and somatic dysfunction of pelvic region: Secondary | ICD-10-CM | POA: Diagnosis not present

## 2019-06-21 DIAGNOSIS — M5136 Other intervertebral disc degeneration, lumbar region: Secondary | ICD-10-CM | POA: Diagnosis not present

## 2019-06-28 DIAGNOSIS — Z96642 Presence of left artificial hip joint: Secondary | ICD-10-CM | POA: Diagnosis not present

## 2019-06-28 DIAGNOSIS — M9904 Segmental and somatic dysfunction of sacral region: Secondary | ICD-10-CM | POA: Diagnosis not present

## 2019-06-28 DIAGNOSIS — M25552 Pain in left hip: Secondary | ICD-10-CM | POA: Diagnosis not present

## 2019-06-28 DIAGNOSIS — M7062 Trochanteric bursitis, left hip: Secondary | ICD-10-CM | POA: Diagnosis not present

## 2019-06-28 DIAGNOSIS — M9905 Segmental and somatic dysfunction of pelvic region: Secondary | ICD-10-CM | POA: Diagnosis not present

## 2019-06-28 DIAGNOSIS — M5136 Other intervertebral disc degeneration, lumbar region: Secondary | ICD-10-CM | POA: Diagnosis not present

## 2019-06-28 DIAGNOSIS — M9903 Segmental and somatic dysfunction of lumbar region: Secondary | ICD-10-CM | POA: Diagnosis not present

## 2019-06-30 ENCOUNTER — Other Ambulatory Visit: Payer: Self-pay

## 2019-06-30 ENCOUNTER — Other Ambulatory Visit: Payer: Medicare Other

## 2019-06-30 DIAGNOSIS — E538 Deficiency of other specified B group vitamins: Secondary | ICD-10-CM | POA: Diagnosis not present

## 2019-06-30 DIAGNOSIS — E782 Mixed hyperlipidemia: Secondary | ICD-10-CM | POA: Diagnosis not present

## 2019-07-01 DIAGNOSIS — M9904 Segmental and somatic dysfunction of sacral region: Secondary | ICD-10-CM | POA: Diagnosis not present

## 2019-07-01 DIAGNOSIS — M5136 Other intervertebral disc degeneration, lumbar region: Secondary | ICD-10-CM | POA: Diagnosis not present

## 2019-07-01 DIAGNOSIS — M9905 Segmental and somatic dysfunction of pelvic region: Secondary | ICD-10-CM | POA: Diagnosis not present

## 2019-07-01 DIAGNOSIS — M9903 Segmental and somatic dysfunction of lumbar region: Secondary | ICD-10-CM | POA: Diagnosis not present

## 2019-07-01 LAB — LIPID PANEL
Chol/HDL Ratio: 4 ratio (ref 0.0–4.4)
Cholesterol, Total: 326 mg/dL — ABNORMAL HIGH (ref 100–199)
HDL: 82 mg/dL (ref >39)
LDL Chol Calc (NIH): 233 mg/dL — ABNORMAL HIGH (ref 0–99)
Triglycerides: 73 mg/dL (ref 0–149)
VLDL Cholesterol Cal: 11 mg/dL (ref 5–40)

## 2019-07-01 LAB — VITAMIN B12: Vitamin B-12: 1167 pg/mL (ref 232–1245)

## 2019-07-02 ENCOUNTER — Other Ambulatory Visit: Payer: Self-pay

## 2019-07-02 ENCOUNTER — Ambulatory Visit (INDEPENDENT_AMBULATORY_CARE_PROVIDER_SITE_OTHER): Payer: Medicare Other | Admitting: Family Medicine

## 2019-07-02 ENCOUNTER — Encounter: Payer: Self-pay | Admitting: Family Medicine

## 2019-07-02 VITALS — BP 124/74 | HR 77 | Temp 97.5°F | Resp 10 | Ht 62.6 in | Wt 134.9 lb

## 2019-07-02 DIAGNOSIS — R5382 Chronic fatigue, unspecified: Secondary | ICD-10-CM

## 2019-07-02 DIAGNOSIS — F4323 Adjustment disorder with mixed anxiety and depressed mood: Secondary | ICD-10-CM | POA: Diagnosis not present

## 2019-07-02 DIAGNOSIS — F430 Acute stress reaction: Secondary | ICD-10-CM

## 2019-07-02 DIAGNOSIS — Z789 Other specified health status: Secondary | ICD-10-CM

## 2019-07-02 DIAGNOSIS — M2550 Pain in unspecified joint: Secondary | ICD-10-CM

## 2019-07-02 DIAGNOSIS — M797 Fibromyalgia: Secondary | ICD-10-CM

## 2019-07-02 DIAGNOSIS — R7303 Prediabetes: Secondary | ICD-10-CM | POA: Diagnosis not present

## 2019-07-02 DIAGNOSIS — F411 Generalized anxiety disorder: Secondary | ICD-10-CM

## 2019-07-02 DIAGNOSIS — F410 Panic disorder [episodic paroxysmal anxiety] without agoraphobia: Secondary | ICD-10-CM

## 2019-07-02 DIAGNOSIS — E782 Mixed hyperlipidemia: Secondary | ICD-10-CM | POA: Diagnosis not present

## 2019-07-02 HISTORY — DX: Pain in unspecified joint: M25.50

## 2019-07-02 MED ORDER — DULOXETINE HCL 30 MG PO CPEP: 30.0000 mg | ORAL_CAPSULE | Freq: Every day | ORAL | 0 refills | Status: DC

## 2019-07-02 NOTE — Progress Notes (Signed)
Impression and Recommendations:    1. Mixed hyperlipidemia   2. Statin intolerance   3. Muscle pain, fibromyalgia   4. Arthralgia, unspecified joint   5. Anxiety state   6. Panic attack as reaction to stress   7. Prediabetes- 6.0 12/17   8. Adjustment disorder with mixed anxiety and depressed mood   9. Chronic fatigue- 2ndary to FM and Mood d/o      Prediabetes - A1c was 6.0 last check, down from 6.1 one year prior. - Stable at this time.  - Advised patient to continue to stay active and eat a healthy diet low in sugar and refined carbohydrates to help prevent onset of diabetes.  - Will continue to monitor and re-check as discussed.   Mixed hyperlipidemia - statin intolerance - Last FLP obtained 3 months ago:  Triglycerides = 73, WNL, down from 95 prior. HDL = 82, WNL, up from 76 prior. LDL = 233, elevated, from 246 prior.  - Per patient, she discontinued statin med on her own due to body aches. - Cholesterol levels remain poorly controlled.  - Patient is established with Dr. [name] of Cardiology. - Advised patient to speak with cardiology regarding cholesterol management.  - Discussed critical need to control patient's cholesterol levels and reduce LDL.  - Dietary changes such as low saturated & trans fat diets for hyperlipidemia and low carb diets for hypertriglyceridemia discussed with patient.    - Encouraged patient to follow AHA guidelines for regular exercise.  - We will continue to monitor alongside cardiology.   Muscle pain, fibromyalgia, arthralgia - Stable at this time. - Patient will continue management through various specialists as established. - Will continue to monitor alongside specialists.   Anxiety state, Panic Attack as Reaction to Stress - Patient sent to LCSW for counseling to help with anxiety and mood. - Per patient, discontinued meeting with him due to feeling it was a poor match.  - STRONGLY advised patient to seek the  assistance of an alternative counselor to assist with improved peace of mind, counseling for the grief due to loss of loved ones, and chronic pain.  - Continue Lexapro as prescribed.  See med list.  - Strongly advised patient to minimize her intake of xanax.  Discussed that chronic use of xanax may lead to significant health risks such as an increased risk of dementia.  - In addition to prescription intervention and the option of therapy/counseling, reviewed the "spokes of the wheel" of mood and health management.  Stressed the importance of ongoing prudent habits, including regular exercise, appropriate sleep hygiene, healthful dietary habits, and prayer/meditation to relax.  - Will continue to monitor.   Health Counseling & Preventative Maintenance - Advised patient to continue working toward exercising to improve overall mental, physical, and emotional health.   - Encouraged patient to engage in daily physical activity as tolerated, especially a formal exercise routine.  Recommended that the patient eventually strive for at least 150 minutes of moderate cardiovascular activity per week according to guidelines established by the [facility].   - Healthy dietary habits encouraged, including low-carb, and high amounts of lean protein in diet.   - Patient should also consume adequate amounts of water.  - Health counseling performed.  All questions answered.   Meds ordered this encounter  Medications  . DULoxetine (CYMBALTA) 30 MG capsule    Sig: Take 1 capsule (30 mg total) by mouth daily.    Dispense:  90 capsule  Refill:  0    Medications Discontinued During This Encounter  Medication Reason  . busPIRone (BUSPAR) 7.5 MG tablet Error  . DULoxetine (CYMBALTA) 30 MG capsule Reorder      Please see AVS handed out to patient at the end of our visit for further patient instructions/ counseling done pertaining to today's office visit.   Return for f/up 3-4 months for chronic  conditions.     Note:  This note was prepared with assistance of Dragon voice recognition software. Occasional wrong-word or sound-a-like substitutions may have occurred due to the inherent limitations of voice recognition software.   The [place] was signed into law in 2016 which includes the topic of electronic health records.  This provides immediate access to information in MyChart.  This includes consultation notes, operative notes, office notes, lab results and pathology reports.  If you have any questions about what you read please let us know at your next visit or call us at the office.  We are right here with you.   This case required medical decision making of at least moderate complexity.  This document serves as a record of services personally performed by [name], DO. It was created on her behalf by [name], a trained medical scribe. The creation of this record is based on the scribe's personal observations and the provider's statements to them.    The above documentation from [name], medical scribe, has been reviewed by [name], D.O.      --------------------------------------------------------------------------------------------------------------------------------------------------------------------------------------------------------------------------------------------    Subjective:     [name], am serving as scribe for Dr.Zowie Lundahl.   HPI: [name] is a 86 y.o. female who presents to [place] [facility] today for issues as discussed below.  - Anxiety State States that she's been pretty good; "as long as I take the xanax, I can control the anxiety."    She spoke with a LCSW through [employer] and says "I don't know where he was coming from."  States she did not enjoy that experience.  "I never had anything but phone visits."  Says he gave her breathing exercises,  which she does engage in when she experiences tightness in her chest, and states "this does help."  She felt the rest of the homework was unhelpful and "I didn't come off the phone feeling better."  She takes one xanax in the morning, and "I have a record of every one that I take."  On the Lexapro, says "I think the depression is okay."  Says "the anxiety is not over.  If people would quit dying on me, I'd be better."  A friend she's known for 69 years recently died, plus her dog, and says "that has been a lot."  In addition, she hasn't seen her son in 50.  He is coming next week and was able to get both of his shots early.  She cannot see her grandson yet, but is looking forward to her son's visit.  States she sees people once per week through [employer] group, akin to a counseling session.  She is involved herself and engaged in training for several months, and has done this since 1999.  - Lifestyle, Exercise For activity, she uses her wheeled walker to walk daily.  "I'm walking to keep the muscles up."  Some days she has a neighbor walk with her, and notes they engage in conversation.  "I get aggravated, because physically, I'm  unable to do some of the things I think need to be done."  Notes particularly she wishes she could engage in certain yard work, but "if I get on the ground [to pick weeds], I cannot get up."  She has to sit a lot because of her back, "but I can manage that."  - Chronic Pain, Scoliosis, Fibromyalgia Says a lot of her problems come from the pain in her back, the scoliosis.  Notes "the hips, I just had an injection in the left hip because it felt like it was coming out."  She had an X-ray done and discovered it was inflamed.  She started seeing a chiropractor across the street from [facility], "which helps tremendously."  Says "he is able to adjust that lower back to where I am not in pain all the time."  Says she sleeps better if she takes tylenol  for pain management as opposed to Advil.  HPI:  Hyperlipidemia:  86 y.o. female here for cholesterol follow-up.   Regarding statin management, states "I tried it and I could not take it."  She tried taking the crestor every day and states "I just hurt all over, so I cut it down, and I was going two a week, and it was the same thing."  Notes "I said with scoliosis of the spine and the fibromyalgia, I'll be damned if I don't hurt anymore."  - Patient denies any acute concerns or problems with management plan   - She denies new onset of: myalgias, arthralgias, increased fatigue more than normal, chest pains, exercise intolerance, shortness of breath, dizziness, visual changes, headache, lower extremity swelling or claudication.   Most recent cholesterol panel was:  Lab Results  Component Value Date   CHOL 326 (H) 06/30/2019   HDL 82 06/30/2019   LDLCALC 233 (H) 06/30/2019   LDLDIRECT 195.1 04/12/2011   TRIG 73 06/30/2019   CHOLHDL 4.0 06/30/2019   Hepatic Function Latest Ref Rng & Units 05/18/2019 03/29/2019 10/01/2017  Total Protein 6.0 - 8.5 g/dL - 6.7 6.9  Albumin 3.6 - 4.6 g/dL - 4.9(H) -  AST 0 - 40 IU/L - 15 -  ALT 0 - 32 IU/L 18 16 -  Alk Phosphatase 39 - 117 IU/L - 71 -  Total Bilirubin 0.0 - 1.2 mg/dL - 0.5 -  Bilirubin, Direct 0.0 - 0.3 mg/dL - - -     Depression screen [facility] 2/9 07/02/2019 04/05/2019 02/01/2019  Decreased Interest '2 1 2  ' Down, Depressed, Hopeless '2 1 2  ' PHQ - 2 Score '4 2 4  ' Altered sleeping 1 0 2  Tired, decreased energy '1 1 2  ' Change in appetite 0 0 2  Feeling bad or failure about yourself  1 0 1  Trouble concentrating 0 0 0  Moving slowly or fidgety/restless 0 0 1  Suicidal thoughts 0 0 0  PHQ-9 Score '7 3 12  ' Difficult doing work/chores Somewhat difficult Somewhat difficult Somewhat difficult  Some recent data might be hidden   GAD 7 : Generalized Anxiety Score 07/02/2019 02/01/2019 09/08/2018 05/27/2018  Nervous, Anxious, on Edge '2 3 1 1  ' Control/stop  worrying '2 2 1 1  ' Worry too much - different things 2 3 0 1  Trouble relaxing '3 3 3 1  ' Restless 0 3 0 1  Easily annoyed or irritable 1 2 0 1  Afraid - awful might happen 0 1 0 -  Total GAD 7 Score '10 17 5 ' -  Anxiety Difficulty Somewhat difficult  Somewhat difficult Not difficult at all -      Wt Readings from Last 3 Encounters:  07/02/19 134 lb 14.4 oz (61.2 kg)  04/05/19 126 lb 8 oz (57.4 kg)  02/01/19 138 lb (62.6 kg)   BP Readings from Last 3 Encounters:  07/02/19 124/74  04/05/19 132/87  02/01/19 119/67   Pulse Readings from Last 3 Encounters:  07/02/19 77  04/05/19 84  02/01/19 92   BMI Readings from Last 3 Encounters:  07/02/19 24.20 kg/m  04/05/19 23.90 kg/m  02/01/19 25.40 kg/m     Patient Care Team    Relationship Specialty Notifications Start End  [name], DO PCP - General Family Medicine  08/30/15   Ladene Artist, MD Referring Physician Gastroenterology  07/02/10   Myrlene Broker, MD Referring Physician Urology  07/02/10   Clent Jacks, MD Referring Physician Ophthalmology  07/02/10   [name], MD Referring Physician Cardiology  07/02/10   Danella Sensing, MD Consulting Physician Dermatology  11/09/15   Juanito Doom, MD Consulting Physician Pulmonary Disease  11/09/15   Suella Broad, MD Consulting Physician Physical Medicine and Rehabilitation  11/09/15    Comment: pain mgt :  injections and pain meds  Rockne Menghini, RPH-CPP Pharmacist Cardiology  11/19/15    Comment:  " HTN Clinic" - med mgt of her HTN per request of Dr Alvester Chou- Cards  Alda Berthold, DO Consulting Physician Neurology  12/17/15   Harrington Challenger, [facility]  Pharmacist  07/29/16    Comment: clinical pharm-D in HTN clinic  Paralee Cancel, MD Consulting Physician Orthopedic Surgery  10/23/17    Comment: R hip replacement  Bo Merino, MD Consulting Physician Rheumatology  10/23/17   Melina Schools, MD Consulting Physician Orthopedic Surgery  04/20/18   Candee Furbish, MD Consulting Physician Pulmonary Disease  02/01/19    Comment: Taking over for Dr. Lake Bells     Patient Active Problem List   Diagnosis Date Noted  . Medication refused- (any cholesterol ones) 01/20/2016  . Prediabetes- 6.0 12/17 01/11/2016  . Adjustment disorder with mixed anxiety and depressed mood 01/11/2016  . Asthma, mild persistent 12/19/2008  . Hyperlipidemia 08/12/2007  . Essential hypertension 08/12/2007  . Chronic fatigue syndrome with fibromyalgia 01/20/2016  . Depression 11/24/2015  . B12 deficiency 08/30/2015  . Iron deficiency anemia 11/03/2009  . Anxiety state 08/12/2007  . Muscle pain, fibromyalgia 08/12/2007  . Environmental and seasonal allergies 01/20/2016  . Vitamin D deficiency 01/19/2016  . Chronic Fatigue- 2ndary to FM and Mood d/o  06/20/2015  . Memory loss   . Lumbar radiculopathy, chronic   . LBBB (left bundle branch block) 11/13/2010  . G E R D 08/12/2007  . Arthralgia 07/02/2019  . Metatarsalgia of left foot 04/23/2019  . Ulcer of toe ([place]) 04/23/2019  . Panic attack as reaction to stress 04/05/2019  . Medication management 12/29/2018  .  Muscle spasms of back due to scoliosis 05/27/2018  . Chronic back pain 05/27/2018  . Atypical chest pain 02/17/2018  . Primary osteoarthritis of both hands 10/21/2017  . DDD (degenerative disc disease), lumbar 10/21/2017  . History of COPD 10/01/2017  . Former smoker 10/01/2017  . Pain in joint of right shoulder 06/25/2017  . Trochanteric bursitis of right hip 06/25/2017  . Primary [name] disease without gangrene-  hands and feet 06/02/2017  . Iron deficiency anemia, unspecified 04/23/2017  . GAD (generalized anxiety disorder) 04/23/2017  . Insomnia 04/23/2017  . S/P right TH revision  09/24/2016  . Medication intolerance- statins- (make her FM much W) 01/10/2016  . Reaction, situational, acute, to stress(husband dying) 09/21/2015  . Dysuria 08/18/2015  . Herpes simplex labialis 04/03/2012  .  Shingles 04/12/2011  . MIGRAINE HEADACHE 05/30/2010  . Allergic rhinitis 08/12/2007  . ARTHRITIS 08/12/2007    Past Medical history, Surgical history, Family history, Social history, Allergies and Medications have been entered into the medical record, reviewed and changed as needed.    Current Meds  Medication Sig  . acetaminophen (TYLENOL) 500 MG tablet Take by mouth as needed.  Marland Kitchen albuterol (PROAIR HFA) 108 (90 Base) MCG/ACT inhaler Inhale 2 puffs into the lungs every 6 (six) hours as needed.  . ALPRAZolam (XANAX) 0.25 MG tablet TAKE 1 TABLET BY MOUTH THREE TIMES DAILYAS NEEDED FOR ANXIETY (Patient taking differently: Patient states shes taking 1 tablet BID every day)  . amLODipine (NORVASC) 5 MG tablet Take 1 tablet (5 mg total) by mouth daily. Please make yearly appt with Dr. [name] for May for future refills. 1st attempt  . budesonide-formoterol (SYMBICORT) 80-4.5 MCG/ACT inhaler Inhale 2 puffs into the lungs 2 (two) times daily.  . busPIRone (BUSPAR) 15 MG tablet TAKE 1 TABLET BY MOUTH 3 TIMES DAILY (Patient taking differently: Patient reports shes taking 1/2 tablet BID)  . cetirizine (ZYRTEC) 10 MG tablet Take 10 mg by mouth daily.   . chlorthalidone (HYGROTON) 25 MG tablet TAKE 1/2 TABLET BY MOUTH DAILY  . Cholecalciferol (VITAMIN D3) 5000 units TABS 5,000 IU OTC vitamin D3 daily.  . Cyanocobalamin (VITAMIN B-12) 2500 MCG TABS Take 1,250 mcg by mouth daily.  Marland Kitchen docusate sodium (COLACE) 100 MG capsule Take 1 capsule (100 mg total) by mouth 2 (two) times daily. (Patient taking differently: Take 100 mg by mouth as needed. )  . DULoxetine (CYMBALTA) 30 MG capsule Take 1 capsule (30 mg total) by mouth daily.  Marland Kitchen escitalopram (LEXAPRO) 5 MG tablet TAKE 1 TABLET BY MOUTH EACH NIGHT AT BEDTIME  . ferrous sulfate (FERROUSUL) 325 (65 FE) MG tablet Take 1 tablet (325 mg total) by mouth 3 (three) times daily with meals. (Patient taking differently: Take 325 mg by mouth 2 (two) times daily with a  meal. )  . fluticasone (FLONASE) 50 MCG/ACT nasal spray USE 2 SPRAYS INTO EACH NOSTRIL EVERY DAY  . losartan (COZAAR) 100 MG tablet TAKE 1 TABLET BY MOUTH DAILY  . methocarbamol (ROBAXIN) 500 MG tablet as needed.  . montelukast (SINGULAIR) 10 MG tablet TAKE 1 TABLET BY MOUTH DAILY AT BEDTIME  . omeprazole (PRILOSEC) 20 MG capsule TAKE 1 CAPSULE BY MOUTH DAILY  . traMADol (ULTRAM) 50 MG tablet Take by mouth as needed.  . [DISCONTINUED] DULoxetine (CYMBALTA) 30 MG capsule Take 1 capsule (30 mg total) by mouth daily. **PATIENT NEEDS APT FOR FURTHER REFILLS**    Allergies:  Allergies  Allergen Reactions  . Morphine   . Morphine Sulfate Other (See Comments)    Makes her hyper     Review of Systems:  A fourteen system review of systems was performed and found to be positive as per HPI.   Objective:   Blood pressure 124/74, pulse 77, temperature (!) 97.5 F (36.4 C), temperature source Oral, resp. rate 10, height 5' 2.6" (1.59 m), weight 134 lb 14.4 oz (61.2 kg), SpO2 97 %. Body mass index is 24.2 kg/m. General:  Well Developed, well nourished, appropriate for stated age.  Neuro:  Alert and oriented,  extra-ocular muscles intact  HEENT:  Normocephalic, atraumatic, neck  supple, no carotid bruits appreciated  Skin:  no gross rash, warm, pink. Cardiac:  RRR, S1 S2 Respiratory:  ECTA B/L and A/P, Not using accessory muscles, speaking in full sentences- unlabored. Vascular:  Ext warm, no cyanosis apprec.; cap RF less 2 sec. Psych:  No HI/SI, judgement and insight good, Euthymic mood. Full Affect.

## 2019-07-02 NOTE — Patient Instructions (Signed)
Please make sure you talk to Dr. Alvester Chou about alternatives to help control your blood cholesterol next appointment with him in the near future.  Also please consider calling behavioral health for a different counselor if you feel that would be beneficial.   Guidelines for a Low Cholesterol, Low Saturated Fat Diet   Fats - Limit total intake of fats and oils. - Avoid butter, stick margarine, shortening, lard, palm and coconut oils. - Limit mayonnaise, salad dressings, gravies and sauces, unless they are homemade with low-fat ingredients. - Limit chocolate. - Choose low-fat and nonfat products, such as low-fat mayonnaise, low-fat or non-hydrogenated peanut butter, low-fat or fat-free salad dressings and nonfat gravy. - Use vegetable oil, such as canola or olive oil. - Look for margarine that does not contain trans fatty acids. - Use nuts in moderate amounts. - Read ingredient labels carefully to determine both amount and type of fat present in foods. Limit saturated and trans fats! - Avoid high-fat processed and convenience foods.  Meats and Meat Alternatives - Choose fish, chicken, [place] and lean meats. - Use dried beans, peas, lentils and tofu. - Limit egg yolks to three to four per week. - If you eat red meat, limit to no more than three servings per week and choose loin or round cuts. - Avoid fatty meats, such as bacon, sausage, franks, luncheon meats and ribs. - Avoid all organ meats, including liver.  Dairy - Choose nonfat or low-fat milk, yogurt and cottage cheese. - Most cheeses are high in fat. Choose cheeses made from non-fat milk, such as mozzarella and ricotta cheese. - Choose light or fat-free cream cheese and sour cream. - Avoid cream and sauces made with cream.  Fruits and Vegetables - Eat a wide variety of fruits and vegetables. - Use lemon juice, vinegar or "mist" olive oil on vegetables. - Avoid adding sauces, fat or oil to vegetables.  Breads, Cereals and  Grains - Choose whole-grain breads, cereals, pastas and rice. - Avoid high-fat snack foods, such as granola, cookies, pies, pastries, doughnuts and croissants.  Cooking Tips - Avoid deep fried foods. - Trim visible fat off meats and remove skin from poultry before cooking. - Bake, broil, boil, poach or roast poultry, fish and lean meats. - Drain and discard fat that drains out of meat as you cook it. - Add little or no fat to foods. - Use vegetable oil sprays to grease pans for cooking or baking. - Steam vegetables. - Use herbs or no-oil marinades to flavor foods.

## 2019-07-08 DIAGNOSIS — M5136 Other intervertebral disc degeneration, lumbar region: Secondary | ICD-10-CM | POA: Diagnosis not present

## 2019-07-08 DIAGNOSIS — M9905 Segmental and somatic dysfunction of pelvic region: Secondary | ICD-10-CM | POA: Diagnosis not present

## 2019-07-08 DIAGNOSIS — M9904 Segmental and somatic dysfunction of sacral region: Secondary | ICD-10-CM | POA: Diagnosis not present

## 2019-07-08 DIAGNOSIS — M9903 Segmental and somatic dysfunction of lumbar region: Secondary | ICD-10-CM | POA: Diagnosis not present

## 2019-07-14 DIAGNOSIS — M5136 Other intervertebral disc degeneration, lumbar region: Secondary | ICD-10-CM | POA: Diagnosis not present

## 2019-07-14 DIAGNOSIS — M9903 Segmental and somatic dysfunction of lumbar region: Secondary | ICD-10-CM | POA: Diagnosis not present

## 2019-07-14 DIAGNOSIS — M9904 Segmental and somatic dysfunction of sacral region: Secondary | ICD-10-CM | POA: Diagnosis not present

## 2019-07-14 DIAGNOSIS — M9905 Segmental and somatic dysfunction of pelvic region: Secondary | ICD-10-CM | POA: Diagnosis not present

## 2019-07-22 ENCOUNTER — Other Ambulatory Visit: Payer: Self-pay | Admitting: Cardiovascular Disease

## 2019-07-22 DIAGNOSIS — M5136 Other intervertebral disc degeneration, lumbar region: Secondary | ICD-10-CM | POA: Diagnosis not present

## 2019-07-22 DIAGNOSIS — M9904 Segmental and somatic dysfunction of sacral region: Secondary | ICD-10-CM | POA: Diagnosis not present

## 2019-07-22 DIAGNOSIS — M9903 Segmental and somatic dysfunction of lumbar region: Secondary | ICD-10-CM | POA: Diagnosis not present

## 2019-07-22 DIAGNOSIS — M9905 Segmental and somatic dysfunction of pelvic region: Secondary | ICD-10-CM | POA: Diagnosis not present

## 2019-07-23 ENCOUNTER — Other Ambulatory Visit: Payer: Self-pay | Admitting: Family Medicine

## 2019-07-23 DIAGNOSIS — F411 Generalized anxiety disorder: Secondary | ICD-10-CM

## 2019-07-23 DIAGNOSIS — F32A Depression, unspecified: Secondary | ICD-10-CM

## 2019-07-23 DIAGNOSIS — F329 Major depressive disorder, single episode, unspecified: Secondary | ICD-10-CM

## 2019-07-29 DIAGNOSIS — M9905 Segmental and somatic dysfunction of pelvic region: Secondary | ICD-10-CM | POA: Diagnosis not present

## 2019-07-29 DIAGNOSIS — M9903 Segmental and somatic dysfunction of lumbar region: Secondary | ICD-10-CM | POA: Diagnosis not present

## 2019-07-29 DIAGNOSIS — M5136 Other intervertebral disc degeneration, lumbar region: Secondary | ICD-10-CM | POA: Diagnosis not present

## 2019-07-29 DIAGNOSIS — M9904 Segmental and somatic dysfunction of sacral region: Secondary | ICD-10-CM | POA: Diagnosis not present

## 2019-08-05 DIAGNOSIS — M9904 Segmental and somatic dysfunction of sacral region: Secondary | ICD-10-CM | POA: Diagnosis not present

## 2019-08-05 DIAGNOSIS — M5136 Other intervertebral disc degeneration, lumbar region: Secondary | ICD-10-CM | POA: Diagnosis not present

## 2019-08-05 DIAGNOSIS — M9903 Segmental and somatic dysfunction of lumbar region: Secondary | ICD-10-CM | POA: Diagnosis not present

## 2019-08-05 DIAGNOSIS — M9905 Segmental and somatic dysfunction of pelvic region: Secondary | ICD-10-CM | POA: Diagnosis not present

## 2019-08-11 ENCOUNTER — Encounter: Payer: Self-pay | Admitting: Internal Medicine

## 2019-08-11 ENCOUNTER — Other Ambulatory Visit: Payer: Self-pay

## 2019-08-11 ENCOUNTER — Ambulatory Visit (INDEPENDENT_AMBULATORY_CARE_PROVIDER_SITE_OTHER): Payer: Medicare Other | Admitting: Internal Medicine

## 2019-08-11 VITALS — BP 120/62 | HR 75 | Temp 97.3°F | Ht 62.0 in | Wt 135.2 lb

## 2019-08-11 DIAGNOSIS — J4530 Mild persistent asthma, uncomplicated: Secondary | ICD-10-CM | POA: Diagnosis not present

## 2019-08-11 DIAGNOSIS — F410 Panic disorder [episodic paroxysmal anxiety] without agoraphobia: Secondary | ICD-10-CM

## 2019-08-11 NOTE — Patient Instructions (Signed)
-   Ask Dr. Kennon Holter pharmacist what they think about your med list, I am happy to make any adjustments needed to maximize your quality of life and minimize your breathlessness episodes - Come back in 6 months or sooner if not feeling well

## 2019-08-11 NOTE — Progress Notes (Signed)
Synopsis: Longstanding patient of Dr. Lake Bells managed here for asthma  Subjective:   PATIENT ID: [name] GENDER: female DOB: Mar 23, 1934, MRN: [id number]  Chief Complaint  Patient presents with  . Follow-up    no SOB/ whezzing, dry cough at times    HPI Here for asthma, recurrent sinus congestion complicated by anxiety disorder.  - Regimen consists of symbicort 80/4.5 BID, singulair, omeprazole, flonase, zyrtec, PRN xanax  IgE 3 2017, 4 2009 Eos not elevated  Breathing episodes have improved greatly with PRN xanax.  Unfortunately she seems to be more forgetful on this medicine. She and her PCP have been working on trying to figure out alternative regimen, unfortunately her PCP left the practice   ROS + symptoms in bold Fevers, chills, weight loss, fatigue Nausea, vomiting, diarrhea Shortness of breath, wheezing, cough Chest pain, palpitations, lower ext edema   Objective:  GEN: elderly woman in NAD HEENT: MMM, trachea midline CV: RRR, ext warm PULM: clear in all lung fields, no accessory muscle use GI: Soft, +BS EXT: no edema NEURO: moves all 4 ext to command PSYCH: AOx3, anxious SKIN: No rashes    Vitals:   08/11/19 1110  BP: 120/62  Pulse: 75  Temp: (!) 97.3 F (36.3 C)  TempSrc: Temporal  SpO2: 98%  Weight: 135 lb 3.2 oz (61.3 kg)  Height: 5\' 2"  (1.575 m)   98% on RA BMI Readings from Last 3 Encounters:  08/11/19 24.73 kg/m  07/02/19 24.20 kg/m  04/05/19 23.90 kg/m   Wt Readings from Last 3 Encounters:  08/11/19 135 lb 3.2 oz (61.3 kg)  07/02/19 134 lb 14.4 oz (61.2 kg)  04/05/19 126 lb 8 oz (57.4 kg)     CBC    Component Value Date/Time   WBC 7.6 03/29/2019 1046   WBC 7.0 10/01/2017 1114   RBC 4.67 03/29/2019 1046   RBC 4.61 10/01/2017 1114   HGB 14.4 03/29/2019 1046   HGB 12.6 05/20/2011 1331   HCT 42.3 03/29/2019 1046   HCT 37.5 05/20/2011 1331   PLT 232 03/29/2019 1046   MCV 91 03/29/2019 1046   MCV 93.7 05/20/2011  1331   MCH 30.8 03/29/2019 1046   MCH 29.9 10/01/2017 1114   MCHC 34.0 03/29/2019 1046   MCHC 33.6 10/01/2017 1114   RDW 12.4 03/29/2019 1046   RDW 15.3 (H) 05/20/2011 1331   LYMPHSABS 1.2 03/29/2019 1046   LYMPHSABS 1.3 05/20/2011 1331   MONOABS 1.2 (H) 09/07/2016 1725   MONOABS 0.4 05/20/2011 1331   EOSABS 0.1 03/29/2019 1046   BASOSABS 0.0 03/29/2019 1046   BASOSABS 0.0 05/20/2011 1331    Chest Imaging: None  Pulmonary Functions Testing Results: PFT Results Latest Ref Rng & Units 08/18/2015  FVC-Pre L 2.43  FVC-Predicted Pre % 98  FVC-Post L 2.45  FVC-Predicted Post % 98  Pre FEV1/FVC % % 84  Post FEV1/FCV % % 86  FEV1-Pre L 2.05  FEV1-Predicted Pre % 111  FEV1-Post L 2.10  DLCO UNC% % 76  DLCO COR %Predicted % 88  TLC L 4.57  TLC % Predicted % 93  RV % Predicted % 92  Interpretation: WNL  Echo diastolic dysfunction [id number] Nuclear stress WNL 2019   Assessment & Plan:   # Mild persistent asthma with superimposed panic attack disorder # Panic attacks worsened by frequent albuterol and prednisone Rx; using xanax has prevented her from needing these meds # Fibromyalgia on multiple mood-affecting medications # Memory issues with xanax.  She is also  wondering if it is one of the other meds she is on including lexapro, cymbalta, tramadol, buspar.  There are some concerning interactions here but she would like to discuss them with Dr. Kennon Holter pharmacist before changing any other medications.  Discussion: - Continue symbicort 80/4.5 BID and albuterol PRN  Regarding benzodiazepines, I also share her concern regarding memory issues.  These could be directly related to the benzodiazepine but I also do not want her repeatedly getting steroids and albuterol therapies for her anxiety attacks.  She is not keen on cutting back until she meets with Dr. [name] next week.  From my standpoint if she could get enrolled to see a therapist may be one option to get less dependent on meds.  I  am happy to try a different less dependence-causing drug in this class like klonipin.  Or we could consider atarax.  Overall this may be something better handled by PCP or mental health specialist but am happy to help if needed.  MDM Total of 34 minutes spent caring for this patient > 50% in face to face care   Current Outpatient Medications:  .  albuterol (PROAIR HFA) 108 (90 Base) MCG/ACT inhaler, Inhale 2 puffs into the lungs every 6 (six) hours as needed., Disp: 1 Inhaler, Rfl: 5 .  ALPRAZolam (XANAX) 0.25 MG tablet, TAKE 1 TABLET BY MOUTH THREE TIMES DAILYAS NEEDED FOR ANXIETY (Patient taking differently: Patient states shes taking 1 tablet BID every day), Disp: 50 tablet, Rfl: 5 .  amLODipine (NORVASC) 5 MG tablet, Take 1 tablet (5 mg total) by mouth daily. Please make yearly appt with Dr. [name] for May for future refills. 1st attempt, Disp: 90 tablet, Rfl: 1 .  budesonide-formoterol (SYMBICORT) 80-4.5 MCG/ACT inhaler, Inhale 2 puffs into the lungs 2 (two) times daily., Disp: 10.2 g, Rfl: 5 .  busPIRone (BUSPAR) 15 MG tablet, TAKE 1 TABLET BY MOUTH 3 TIMES DAILY (Patient taking differently: Patient reports shes taking 1/2 tablet BID), Disp: 90 tablet, Rfl: 1 .  cetirizine (ZYRTEC) 10 MG tablet, Take 10 mg by mouth daily. , Disp: , Rfl:  .  chlorthalidone (HYGROTON) 25 MG tablet, TAKE 1/2 TABLET BY MOUTH DAILY, Disp: 45 tablet, Rfl: 0 .  Cholecalciferol (VITAMIN D3) 5000 units TABS, 5,000 IU OTC vitamin D3 daily., Disp: 90 tablet, Rfl: 3 .  Cyanocobalamin (VITAMIN B-12) 2500 MCG TABS, Take 1,250 mcg by mouth daily., Disp: , Rfl:  .  docusate sodium (COLACE) 100 MG capsule, Take 1 capsule (100 mg total) by mouth 2 (two) times daily. (Patient taking differently: Take 100 mg by mouth as needed. ), Disp: 10 capsule, Rfl: 0 .  DULoxetine (CYMBALTA) 30 MG capsule, Take 1 capsule (30 mg total) by mouth daily., Disp: 90 capsule, Rfl: 0 .  escitalopram (LEXAPRO) 5 MG tablet, TAKE 1 TABLET BY MOUTH EACH  NIGHT AT BEDTIME, Disp: 90 tablet, Rfl: 0 .  ferrous sulfate (FERROUSUL) 325 (65 FE) MG tablet, Take 1 tablet (325 mg total) by mouth 3 (three) times daily with meals. (Patient taking differently: Take 325 mg by mouth 2 (two) times daily with a meal. ), Disp: , Rfl:  .  fluticasone (FLONASE) 50 MCG/ACT nasal spray, USE 2 SPRAYS INTO EACH NOSTRIL EVERY DAY, Disp: 48 g, Rfl: 3 .  losartan (COZAAR) 100 MG tablet, TAKE 1 TABLET BY MOUTH DAILY, Disp: 90 tablet, Rfl: 1 .  methocarbamol (ROBAXIN) 500 MG tablet, as needed., Disp: , Rfl:  .  montelukast (SINGULAIR) 10 MG tablet, TAKE 1  TABLET BY MOUTH DAILY AT BEDTIME, Disp: 90 tablet, Rfl: 1 .  omeprazole (PRILOSEC) 20 MG capsule, TAKE 1 CAPSULE BY MOUTH DAILY, Disp: 90 capsule, Rfl: 1 .  rosuvastatin (CRESTOR) 5 MG tablet, Take 1 tablet (5 mg total) by mouth at bedtime., Disp: 90 tablet, Rfl: 0 .  traMADol (ULTRAM) 50 MG tablet, Take by mouth as needed., Disp: , Rfl:  .  acetaminophen (TYLENOL) 500 MG tablet, Take by mouth as needed., Disp: , Rfl:    Candee Furbish, MD [facility] 08/11/2019 11:25 AM

## 2019-08-12 DIAGNOSIS — M9904 Segmental and somatic dysfunction of sacral region: Secondary | ICD-10-CM | POA: Diagnosis not present

## 2019-08-12 DIAGNOSIS — M9905 Segmental and somatic dysfunction of pelvic region: Secondary | ICD-10-CM | POA: Diagnosis not present

## 2019-08-12 DIAGNOSIS — M5136 Other intervertebral disc degeneration, lumbar region: Secondary | ICD-10-CM | POA: Diagnosis not present

## 2019-08-12 DIAGNOSIS — M9903 Segmental and somatic dysfunction of lumbar region: Secondary | ICD-10-CM | POA: Diagnosis not present

## 2019-08-18 ENCOUNTER — Other Ambulatory Visit: Payer: Self-pay

## 2019-08-18 ENCOUNTER — Ambulatory Visit (INDEPENDENT_AMBULATORY_CARE_PROVIDER_SITE_OTHER): Payer: Medicare Other | Admitting: Cardiovascular Disease

## 2019-08-18 ENCOUNTER — Encounter: Payer: Self-pay | Admitting: Cardiovascular Disease

## 2019-08-18 ENCOUNTER — Other Ambulatory Visit: Payer: Self-pay | Admitting: Internal Medicine

## 2019-08-18 ENCOUNTER — Other Ambulatory Visit: Payer: Self-pay | Admitting: Pulmonary Disease

## 2019-08-18 VITALS — BP 148/72 | HR 75 | Ht 62.0 in | Wt 135.0 lb

## 2019-08-18 DIAGNOSIS — I447 Left bundle-branch block, unspecified: Secondary | ICD-10-CM | POA: Diagnosis not present

## 2019-08-18 DIAGNOSIS — I10 Essential (primary) hypertension: Secondary | ICD-10-CM

## 2019-08-18 DIAGNOSIS — E7800 Pure hypercholesterolemia, unspecified: Secondary | ICD-10-CM

## 2019-08-18 NOTE — Assessment & Plan Note (Signed)
History of essential hypertension a blood pressure measured today 148/72.  She is on amlodipine, chlorthalidone, and losartan.

## 2019-08-18 NOTE — Assessment & Plan Note (Signed)
History of hyperlipidemia intolerant to statin therapy.  Her last lipid profile performed 06/30/2019 revealed a total cholesterol of 326 with LDL of 230 and HDL of 82.  She is on low-dose rosuvastatin which she is not tolerating.  We will refer her to Dr. Lysbeth Penner lipid clinic for further evaluation.  She may benefit from starting a PCSK9.

## 2019-08-18 NOTE — Progress Notes (Signed)
08/18/2019 [name]   21-Aug-1933  [id number]  Primary Physician Sonya Reid, PA-C Primary Cardiologist: Sonya Harp MD [name], [place]  HPI:  [name] is a 86 y.o.  mildly-overweight married Caucasian female, mother of 3 and grandmother of 1 grandchild, who worked as a Engineer, manufacturing systems last spoke to her for follow-up for a virtual telemedicine phone visit 08/18/2018.Sonya KitchenHer husband [name] was a patient of mine as well unfortunatelyhadLuis body dementia and passed away in 16-Oct-2022 of last year. She lives by herself with her daughter [name] who lives 2 miles away and a son who lives up in [place]..Her risk factors include type 2 diabetes, hypertension, and hyperlipidemia. She does have fibromyalgia. She has had a normal 2D echo and Myoview stress test in the past.Since I saw her a little over a year ago she unfortunately had to have a redo right total hip replacement on 09/24/2016 by Dr. Alvan Dame. She denies chest pain or shortness of breath. She does have purplish discoloration of both feet despite normal pulses and normal Doppler studies for unclear reasons.  She sawKathrynLawrenceNPin the office 01/29/2018 with atypical chest pain and asthma type symptoms. Myoview stress test was low risk nonischemic, and echo revealed normal LV function with a anteroapical wall motion abnormality. I did not think her symptoms were ischemically mediated.  Since I spoke to her a year ago she continues to do well.  She still has episodes of anxiety which she has been treated with Xanax for manifested as shortness of breath.  She did have a negative work-up for chest pain or shortness of breath 2 years ago.   Current Meds  Medication Sig  . acetaminophen (TYLENOL) 500 MG tablet Take by mouth as needed.  [name] albuterol (PROAIR HFA) 108 (90 Base) MCG/ACT inhaler Inhale 2 puffs into the lungs every 6 (six) hours as needed.  . ALPRAZolam (XANAX) 0.25 MG tablet  TAKE 1 TABLET BY MOUTH THREE TIMES DAILYAS NEEDED FOR ANXIETY (Patient taking differently: Patient states shes taking 1 tablet BID every day)  . amLODipine (NORVASC) 5 MG tablet Take 1 tablet (5 mg total) by mouth daily. Please make yearly appt with Dr. Gwenlyn Barr for May for future refills. 1st attempt  . budesonide-formoterol (SYMBICORT) 80-4.5 MCG/ACT inhaler Inhale 2 puffs into the lungs 2 (two) times daily.  . busPIRone (BUSPAR) 15 MG tablet TAKE 1 TABLET BY MOUTH 3 TIMES DAILY (Patient taking differently: Patient reports shes taking 1/2 tablet BID)  . cetirizine (ZYRTEC) 10 MG tablet Take 10 mg by mouth daily.   . chlorthalidone (HYGROTON) 25 MG tablet TAKE 1/2 TABLET BY MOUTH DAILY  . Cholecalciferol (VITAMIN D3) 5000 units TABS 5,000 IU OTC vitamin D3 daily.  . Cyanocobalamin (VITAMIN B-12) 2500 MCG TABS Take 1,250 mcg by mouth daily.  [name] docusate sodium (COLACE) 100 MG capsule Take 1 capsule (100 mg total) by mouth 2 (two) times daily. (Patient taking differently: Take 100 mg by mouth as needed. )  . DULoxetine (CYMBALTA) 30 MG capsule Take 1 capsule (30 mg total) by mouth daily.  [name] escitalopram (LEXAPRO) 5 MG tablet TAKE 1 TABLET BY MOUTH EACH NIGHT AT BEDTIME  . ferrous sulfate (FERROUSUL) 325 (65 FE) MG tablet Take 1 tablet (325 mg total) by mouth 3 (three) times daily with meals. (Patient taking differently: Take 325 mg by mouth 2 (two) times daily with a meal. )  . fluticasone (FLONASE) 50 MCG/ACT nasal spray USE 2 SPRAYS INTO EACH NOSTRIL  EVERY DAY  . losartan (COZAAR) 100 MG tablet TAKE 1 TABLET BY MOUTH DAILY  . methocarbamol (ROBAXIN) 500 MG tablet as needed.  . montelukast (SINGULAIR) 10 MG tablet TAKE 1 TABLET BY MOUTH DAILY AT BEDTIME  . omeprazole (PRILOSEC) 20 MG capsule TAKE 1 CAPSULE BY MOUTH DAILY  . rosuvastatin (CRESTOR) 5 MG tablet Take 1 tablet (5 mg total) by mouth at bedtime.  . traMADol (ULTRAM) 50 MG tablet Take by mouth as needed.     Allergies  Allergen Reactions    . Morphine   . Morphine Sulfate Other (See Comments)    Makes her hyper    Social History   Socioeconomic History  . Marital status: Widowed    Spouse name: Not on file  . Number of children: Not on file  . Years of education: Not on file  . Highest education level: Not on file  Occupational History  . Not on file  Tobacco Use  . Smoking status: Former Smoker    Packs/day: 0.10    Years: 10.00    Pack years: 1.00    Types: Cigarettes    Quit date: 03/25/1968    Years since quitting: 51.4  . Smokeless tobacco: Never Used  . 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 Retired Visual merchandiser and Sexual Activity  . Alcohol use: No    Alcohol/week: 0.0 standard drinks  . Drug use: No  . Sexual activity: Not Currently  Other Topics Concern  . Not on file  Social History Narrative   Lives with husband in a 3 story home.  Has 2 children.  Retired Glass blower/designer.  Education: Scientist, water quality.   Social Determinants of Health   Financial Resource Strain:   . Difficulty of Paying Living Expenses:   Food Insecurity:   . Worried About Charity fundraiser in the Last Year:   . Arboriculturist in the Last Year:   Transportation Needs:   . Film/video editor (Medical):   [name] Lack of Transportation (Non-Medical):   Physical Activity:   . Days of Exercise per Week:   . Minutes of Exercise per Session:   Stress:   . Feeling of Stress :   Social Connections:   . Frequency of Communication with Friends and Family:   . Frequency of Social Gatherings with Friends and Family:   . Attends Religious Services:   . Active Member of Clubs or Organizations:   . Attends Archivist Meetings:   [name] Marital Status:   Intimate Partner Violence:   . Fear of Current or Ex-Partner:   . Emotionally Abused:   [name] Physically Abused:   . Sexually Abused:      Review of Systems: General: negative for chills, fever, night sweats or weight changes.  Cardiovascular: negative for chest  pain, dyspnea on exertion, edema, orthopnea, palpitations, paroxysmal nocturnal dyspnea or shortness of breath Dermatological: negative for rash Respiratory: negative for cough or wheezing Urologic: negative for hematuria Abdominal: negative for nausea, vomiting, diarrhea, bright red blood per rectum, melena, or hematemesis Neurologic: negative for visual changes, syncope, or dizziness All other systems reviewed and are otherwise negative except as noted above.    Blood pressure (!) 148/72, pulse 75, height 5\' 2"  (1.575 m), weight 135 lb (61.2 kg).  General appearance: alert and no distress Neck: no adenopathy, no carotid bruit, no JVD, supple, symmetrical, trachea midline and thyroid not enlarged, symmetric, no tenderness/mass/nodules Lungs: clear to auscultation bilaterally Heart: regular rate  and rhythm, S1, S2 normal, no murmur, click, rub or gallop Extremities: extremities normal, atraumatic, no cyanosis or edema Pulses: 2+ and symmetric Skin: Skin color, texture, turgor normal. No rashes or lesions Neurologic: Alert and oriented X 3, normal strength and tone. Normal symmetric reflexes. Normal coordination and gait  EKG sinus rhythm at 75 with left bundle branch block.  I personally reviewed this EKG.  ASSESSMENT AND PLAN:   Hyperlipidemia History of hyperlipidemia intolerant to statin therapy.  Her last lipid profile performed 06/30/2019 revealed a total cholesterol of 326 with LDL of 230 and HDL of 82.  She is on low-dose rosuvastatin which she is not tolerating.  We will refer her to Dr. Lysbeth Penner lipid clinic for further evaluation.  She may benefit from starting a PCSK9.  Essential hypertension History of essential hypertension a blood pressure measured today 148/72.  She is on amlodipine, chlorthalidone, and losartan.  LBBB (left bundle branch block) Chronic      Sonya Harp MD [facility], [facility] 08/18/2019 11:19 AM

## 2019-08-18 NOTE — Assessment & Plan Note (Signed)
Chronic. 

## 2019-08-18 NOTE — Patient Instructions (Signed)
Medication Instructions:  No changes *If you need a refill on your cardiac medications before your next appointment, please call your pharmacy*   Lab Work: None ordered If you have labs (blood work) drawn today and your tests are completely normal, you will receive your results only by: Marland Kitchen MyChart Message (if you have MyChart) OR . A paper copy in the mail If you have any lab test that is abnormal or we need to change your treatment, we will call you to review the results.   Testing/Procedures: None ordered   Follow-Up: At [facility], you and your health needs are our priority.  As part of our continuing mission to provide you with exceptional heart care, we have created designated Provider Care Teams.  These Care Teams include your primary Cardiologist (physician) and Advanced Practice Providers (APPs -  Physician Assistants and Nurse Practitioners) who all work together to provide you with the care you need, when you need it.  We recommend signing up for the patient portal called "MyChart".  Sign up information is provided on this After Visit Summary.  MyChart is used to connect with patients for Virtual Visits (Telemedicine).  Patients are able to view lab/test results, encounter notes, upcoming appointments, etc.  Non-urgent messages can be sent to your provider as well.   To learn more about what you can do with MyChart, go to [email or web address].    Your next appointment:   12 month(s)  The format for your next appointment:   In Person  Provider:   You may see Dr. [name] or one of the following Advanced Practice Providers on your designated Care Team:    Kerin Ransom, PA-C  [place], [place]  Coletta Memos, [place]    Other Instructions A referral has been made to Dr. Debara Pickett as a new patient in the lipid clinic We will schedule an appointment with pharmd for medication reconciliation.

## 2019-08-19 DIAGNOSIS — M9904 Segmental and somatic dysfunction of sacral region: Secondary | ICD-10-CM | POA: Diagnosis not present

## 2019-08-19 DIAGNOSIS — M9905 Segmental and somatic dysfunction of pelvic region: Secondary | ICD-10-CM | POA: Diagnosis not present

## 2019-08-19 DIAGNOSIS — M5136 Other intervertebral disc degeneration, lumbar region: Secondary | ICD-10-CM | POA: Diagnosis not present

## 2019-08-19 DIAGNOSIS — M9903 Segmental and somatic dysfunction of lumbar region: Secondary | ICD-10-CM | POA: Diagnosis not present

## 2019-08-24 MED ORDER — MONTELUKAST SODIUM 10 MG PO TABS: 10.0000 mg | ORAL_TABLET | Freq: Every day | ORAL | 1 refills | Status: DC

## 2019-08-25 ENCOUNTER — Telehealth: Payer: Self-pay | Admitting: Internal Medicine

## 2019-08-25 NOTE — Telephone Encounter (Signed)
pt will be leaving for an appointment at 3. Pt is ok with a detailed message being left on her machine at [phone].

## 2019-08-25 NOTE — Telephone Encounter (Signed)
Spoke with the pt  She states needing refill on her alprazolam  She is seeing pharmacist with Dr Kennon Holter office end of June and wants to at least stay on med until then  Last given 0.25 mg # 50 with 5 refills on 02/29/20 Please advise thanks

## 2019-08-26 DIAGNOSIS — M9905 Segmental and somatic dysfunction of pelvic region: Secondary | ICD-10-CM | POA: Diagnosis not present

## 2019-08-26 DIAGNOSIS — M5136 Other intervertebral disc degeneration, lumbar region: Secondary | ICD-10-CM | POA: Diagnosis not present

## 2019-08-26 DIAGNOSIS — M9903 Segmental and somatic dysfunction of lumbar region: Secondary | ICD-10-CM | POA: Diagnosis not present

## 2019-08-26 DIAGNOSIS — M9904 Segmental and somatic dysfunction of sacral region: Secondary | ICD-10-CM | POA: Diagnosis not present

## 2019-08-26 NOTE — Telephone Encounter (Signed)
Patient checking on status of Xanax refill. Pharmacy is [facility]. Patient phone number is [phone].

## 2019-08-26 NOTE — Telephone Encounter (Signed)
I spoke with the pt and advised still waiting on approval from Dr Tamala Julian for her xanax  Please advise thanks

## 2019-08-27 DIAGNOSIS — R7309 Other abnormal glucose: Secondary | ICD-10-CM | POA: Insufficient documentation

## 2019-08-27 DIAGNOSIS — Z1331 Encounter for screening for depression: Secondary | ICD-10-CM | POA: Diagnosis not present

## 2019-08-27 DIAGNOSIS — J4530 Mild persistent asthma, uncomplicated: Secondary | ICD-10-CM | POA: Diagnosis not present

## 2019-08-27 DIAGNOSIS — I129 Hypertensive chronic kidney disease with stage 1 through stage 4 chronic kidney disease, or unspecified chronic kidney disease: Secondary | ICD-10-CM

## 2019-08-27 DIAGNOSIS — E539 Vitamin B deficiency, unspecified: Secondary | ICD-10-CM

## 2019-08-27 DIAGNOSIS — M797 Fibromyalgia: Secondary | ICD-10-CM | POA: Diagnosis not present

## 2019-08-27 DIAGNOSIS — F339 Major depressive disorder, recurrent, unspecified: Secondary | ICD-10-CM | POA: Diagnosis not present

## 2019-08-27 DIAGNOSIS — F419 Anxiety disorder, unspecified: Secondary | ICD-10-CM | POA: Diagnosis not present

## 2019-08-27 HISTORY — DX: Hypertensive chronic kidney disease with stage 1 through stage 4 chronic kidney disease, or unspecified chronic kidney disease: I12.9

## 2019-08-27 HISTORY — DX: Other abnormal glucose: R73.09

## 2019-08-27 HISTORY — DX: Vitamin B deficiency, unspecified: E53.9

## 2019-08-30 ENCOUNTER — Telehealth: Payer: Self-pay | Admitting: Internal Medicine

## 2019-08-30 NOTE — Telephone Encounter (Signed)
Called and spoke with Patient.  Per medications, Dr Tamala Julian sent Xanax to [facility]. Nothing further at this time.

## 2019-09-01 ENCOUNTER — Telehealth: Payer: Self-pay | Admitting: Cardiovascular Disease

## 2019-09-01 NOTE — Telephone Encounter (Signed)
Pt c/o medication issue:  1. Name of Medication: amLODipine (NORVASC) 5 MG tablet  2. How are you currently taking this medication (dosage and times per day)? 1 tablet in the morning  3. Are you having a reaction (difficulty breathing--STAT)? no  4. What is your medication issue?Patient states she saw her PCP who stated her BP was high. She states she realized she has not been taking her medication and would like to know if she needs to start it again.

## 2019-09-01 NOTE — Telephone Encounter (Signed)
Spoke with patient and she has not been taking her Amlodipine for several months Discussed with [name] D and will have patient resume Amlodipine, monitor blood pressure and call if not consistently below 130/80

## 2019-09-02 ENCOUNTER — Ambulatory Visit: Payer: PRIVATE HEALTH INSURANCE | Admitting: Internal Medicine

## 2019-09-06 DIAGNOSIS — M9904 Segmental and somatic dysfunction of sacral region: Secondary | ICD-10-CM | POA: Diagnosis not present

## 2019-09-06 DIAGNOSIS — M9903 Segmental and somatic dysfunction of lumbar region: Secondary | ICD-10-CM | POA: Diagnosis not present

## 2019-09-06 DIAGNOSIS — M5136 Other intervertebral disc degeneration, lumbar region: Secondary | ICD-10-CM | POA: Diagnosis not present

## 2019-09-06 DIAGNOSIS — M9905 Segmental and somatic dysfunction of pelvic region: Secondary | ICD-10-CM | POA: Diagnosis not present

## 2019-09-20 ENCOUNTER — Other Ambulatory Visit: Payer: Self-pay

## 2019-09-20 ENCOUNTER — Ambulatory Visit (INDEPENDENT_AMBULATORY_CARE_PROVIDER_SITE_OTHER): Payer: Medicare Other | Admitting: Pharmacist

## 2019-09-20 VITALS — BP 142/88 | HR 83 | Resp 15 | Ht 62.0 in | Wt 136.0 lb

## 2019-09-20 DIAGNOSIS — E782 Mixed hyperlipidemia: Secondary | ICD-10-CM

## 2019-09-20 DIAGNOSIS — I10 Essential (primary) hypertension: Secondary | ICD-10-CM | POA: Diagnosis not present

## 2019-09-20 DIAGNOSIS — F411 Generalized anxiety disorder: Secondary | ICD-10-CM

## 2019-09-20 DIAGNOSIS — Z79899 Other long term (current) drug therapy: Secondary | ICD-10-CM | POA: Diagnosis not present

## 2019-09-20 LAB — COMPREHENSIVE METABOLIC PANEL
ALT: 14 IU/L (ref 0–32)
AST: 17 IU/L (ref 0–40)
Albumin/Globulin Ratio: 2.3 — ABNORMAL HIGH (ref 1.2–2.2)
Albumin: 4.8 g/dL — ABNORMAL HIGH (ref 3.6–4.6)
Alkaline Phosphatase: 72 IU/L (ref 48–121)
BUN/Creatinine Ratio: 19 (ref 12–28)
BUN: 14 mg/dL (ref 8–27)
Bilirubin Total: 0.4 mg/dL (ref 0.0–1.2)
CO2: 26 mmol/L (ref 20–29)
Calcium: 9.9 mg/dL (ref 8.7–10.3)
Chloride: 94 mmol/L — ABNORMAL LOW (ref 96–106)
Creatinine, Ser: 0.75 mg/dL (ref 0.57–1.00)
GFR calc Af Amer: 85 mL/min/{1.73_m2} (ref >59)
GFR calc non Af Amer: 73 mL/min/{1.73_m2} (ref >59)
Globulin, Total: 2.1 g/dL (ref 1.5–4.5)
Glucose: 89 mg/dL (ref 65–99)
Potassium: 5 mmol/L (ref 3.5–5.2)
Sodium: 134 mmol/L (ref 134–144)
Total Protein: 6.9 g/dL (ref 6.0–8.5)

## 2019-09-20 LAB — MAGNESIUM: Magnesium: 1.9 mg/dL (ref 1.6–2.3)

## 2019-09-20 MED ORDER — DOCUSATE SODIUM 100 MG PO CAPS
100.0000 mg | ORAL_CAPSULE | Freq: Two times a day (BID) | ORAL | 0 refills | Status: DC | PRN
Start: 2019-09-20 — End: 2020-05-25

## 2019-09-20 MED ORDER — BUSPIRONE HCL 15 MG PO TABS: 7.5000 mg | ORAL_TABLET | Freq: Two times a day (BID) | ORAL | 1 refills | Status: DC

## 2019-09-20 NOTE — Progress Notes (Signed)
Patient ID: [name]                 DOB: 1933-08-31                      MRN: [id number]     HPI: [name] is a 86 y.o. female referred by Dr. [name] to pharmacist clinic. PMH includes pre-diabetes, hypertension,asthma, LBBB, and hyperlipidemia. Patient presents today for ,per her own request, to complete medication reconciliation and discuss potential interaction, medication indication, and potential therapy adjustment. Noted patient cancelled appointment with lipidologist (Dr Debara Pickett) and refused to reschedule. Patient denies problems with current therapy other and stated her PCP is currently changing her   Current HTN meds:  Amlodipine 5mg  daily  Current Lipid medication: none  Previously tried:  Rosuvastatin 5mg  daily for > 40 years primary prevention   BP goal: <130/80  LDL goal: <100 mg/dL  Family History: high cholesterol in mother and sister, no relevant cardiac history  Social History: former smoker, denies alcohol use  Diet: low fat, mainly home cooked  Exercise: activities of daily living  Home BP readings: none provided today  Wt Readings from Last 3 Encounters:  09/20/19 136 lb (61.7 kg)  08/18/19 135 lb (61.2 kg)  08/11/19 135 lb 3.2 oz (61.3 kg)   BP Readings from Last 3 Encounters:  09/20/19 (!) 142/88  08/18/19 (!) 148/72  08/11/19 120/62   Pulse Readings from Last 3 Encounters:  09/20/19 83  08/18/19 75  08/11/19 75    Renal function: Estimated Creatinine Clearance: 45.2 mL/min (by C-G formula based on SCr of 0.75 mg/dL).  Past Medical History:  Diagnosis Date  . ALLERGIC RHINITIS   . ANEMIA, IRON DEFICIENCY   . ANXIETY   . ARTHRITIS    s/p bilateral THR  . ASTHMA, EXTRINSIC   . Bronchitis   . Cataracts, both eyes   . Chronic constipation   . FIBROMYALGIA    fibromyalgia  . G E R D   . High cholesterol   . HYPERLIPIDEMIA   . HYPERTENSION   . Interstitial cystitis   . MIGRAINE HEADACHE     Current Outpatient Medications  on File Prior to Visit  Medication Sig Dispense Refill  . acetaminophen (TYLENOL) 500 MG tablet Take by mouth as needed.    Marland Kitchen albuterol (PROAIR HFA) 108 (90 Base) MCG/ACT inhaler Inhale 2 puffs into the lungs every 6 (six) hours as needed. 1 Inhaler 5  . ALPRAZolam (XANAX) 0.25 MG tablet Take 1 tablet (0.25 mg total) by mouth 2 (two) times daily as needed for anxiety. Patient states shes taking 1 tablet BID every day 60 tablet 5  . amLODipine (NORVASC) 5 MG tablet Take 1 tablet (5 mg total) by mouth daily. Please make yearly appt with Dr. [name] for [place] for future refills. 1st attempt 90 tablet 1  . budesonide-formoterol (SYMBICORT) 80-4.5 MCG/ACT inhaler Inhale 2 puffs into the lungs 2 (two) times daily. 10.2 g 5  . cetirizine (ZYRTEC) 10 MG tablet Take 10 mg by mouth daily.     . chlorthalidone (HYGROTON) 25 MG tablet TAKE 1/2 TABLET BY MOUTH DAILY 45 tablet 0  . Cholecalciferol (VITAMIN D3) 5000 units TABS 5,000 IU OTC vitamin D3 daily. 90 tablet 3  . Cyanocobalamin (VITAMIN B-12) 2500 MCG TABS Take 1,250 mcg by mouth daily.    . DULoxetine (CYMBALTA) 30 MG capsule Take 1 capsule (30 mg total) by mouth daily. 90 capsule 0  .  ferrous sulfate (FERROUSUL) 325 (65 FE) MG tablet Take 1 tablet (325 mg total) by mouth 3 (three) times daily with meals. (Patient taking differently: Take 325 mg by mouth 2 (two) times daily with a meal. )    . fluticasone (FLONASE) 50 MCG/ACT nasal spray USE 2 SPRAYS INTO EACH NOSTRIL EVERY DAY 48 g 3  . losartan (COZAAR) 100 MG tablet TAKE 1 TABLET BY MOUTH DAILY 90 tablet 1  . methocarbamol (ROBAXIN) 500 MG tablet as needed.    . montelukast (SINGULAIR) 10 MG tablet Take 1 tablet (10 mg total) by mouth at bedtime. 90 tablet 1  . omeprazole (PRILOSEC) 20 MG capsule TAKE 1 CAPSULE BY MOUTH DAILY 90 capsule 1  . traMADol (ULTRAM) 50 MG tablet Take by mouth as needed.     No current facility-administered medications on file prior to visit.    Allergies  Allergen  Reactions  . Morphine   . Morphine Sulfate Other (See Comments)    Makes her hyper    Blood pressure (!) 142/88, pulse 83, resp. rate 15, height 5\' 2"  (1.575 m), weight 136 lb (61.7 kg), SpO2 95 %.  Essential hypertension Blood pressure remains above goal but patient not comfortable making changes in therapy today. She discussed BP goal with cardiologist and consider anything below 253 sytolic to be acceptable. Denies problems with current therapy and will like yto take less medication is possible. Patient also suffers chronic pain that may be causing increase in blood pressure.   Hyperlipidemia We discussed purpose of LDL lowering therapy and indication for PCSK9 inhibitors. She understands risks of elevated LDL and reports working on primary prevention for the last 40 years. No contraindications or significant interactions expected betweenRepatha/Praluent and current therapy. Patient prefers to hold on initiating any injectables and will NOT like trial with Nexletol either. Will consider re-scheduling appointment with DR Hilty "to see what he has to say" , but doubtful on initiating lipid management in the near future.   Anxiety state PCP currently working on adjusting SSRI, SNRI , and benzodiazepine therapy. No changes recommended.  Encounter for medication management Instructions to change cetrizine 10mg  to bedtime and wean-off omeprazole provided during OV. Patient will apply changes as soon as possible.    Adella Manolis Rodriguez-Guzman PharmD, BCPS, [place] [id number] 09/22/2019 9:33 AM

## 2019-09-20 NOTE — Patient Instructions (Addendum)
Return for a  follow up appointment in [place] to the lab in [place] your blood pressure at home daily (if able) and keep record of the readings.  Take your BP meds as follows: *CHANGE certizine 10mg  to EVENING*  *WEAN off Omeprazole 20mg  once back from trip (take very other day for 1 week, then every 3rd day for 1 week, then OFF). Okay to use TUMS to control acid reflux*  *NO other changes at this time*  *Follow instructions Dr. [name] instrcutions to wean off Xanax and Buspirone*   Bring all of your meds, your BP cuff and your record of home blood pressures to your next appointment.  Exercise as you're able, try to walk approximately 30 minutes per day.  Keep salt intake to a minimum, especially watch canned and prepared boxed foods.  Eat more fresh fruits and vegetables and fewer canned items.  Avoid eating in fast food restaurants.    HOW TO TAKE YOUR BLOOD PRESSURE: . Rest 5 minutes before taking your blood pressure. .  Don't smoke or drink caffeinated beverages for at least 30 minutes before. . Take your blood pressure before (not after) you eat. . Sit comfortably with your back supported and both feet on the floor (don't cross your legs). . Elevate your arm to heart level on a table or a desk. . Use the proper sized cuff. It should fit smoothly and snugly around your bare upper arm. There should be enough room to slip a fingertip under the cuff. The bottom edge of the cuff should be 1 inch above the crease of the elbow. . Ideally, take 3 measurements at one sitting and record the average.

## 2019-09-21 NOTE — Progress Notes (Signed)
Office Visit Note  Patient: [name]             Date of Birth: [birth year]           MRN: [id number]             PCP: Michael Boston, MD Referring: [name], DO Visit Date: 10/04/2019 Occupation: @GUAROCC @  Subjective:  [name] phenomenon.   History of Present Illness: [name] is a 86 y.o. female with history of [name], osteoarthritis and degenerative disc disease.  She states she continues to have [name] phenomenon in her hands and feet.  She denies any digital ulcers.  She states her hands sometimes get really hot and sometimes really cold at night.  She has been also experiencing some tingling sensation in her feet.  She has history of disc disease of lumbar spine and scoliosis which causes discomfort.  She states she was also having increased anxiety and panic attacks for which she was seen by her PCP.  She was placed on Xanax which is helped her to some extent.  She is also taking Cymbalta.  Activities of Daily Living:  Patient reports morning stiffness for 24  hours.   Patient Reports nocturnal pain.  Difficulty dressing/grooming: Denies Difficulty climbing stairs: Reports Difficulty getting out of chair: Reports Difficulty using hands for taps, buttons, cutlery, and/or writing: Reports  Review of Systems  Constitutional: Positive for fatigue. Negative for night sweats, weight gain and weight loss.  HENT: Positive for mouth dryness and nose dryness. Negative for mouth sores, trouble swallowing and trouble swallowing.   Eyes: Positive for itching and dryness. Negative for pain, redness and visual disturbance.  Respiratory: Negative for cough, shortness of breath and difficulty breathing.   Cardiovascular: Negative for chest pain, palpitations, hypertension, irregular heartbeat and swelling in legs/feet.  Gastrointestinal: Negative for blood in stool, constipation and diarrhea.  Endocrine: Negative for increased urination.  Genitourinary: Positive  for nocturia. Negative for difficulty urinating and vaginal dryness.  Musculoskeletal: Positive for arthralgias, joint pain, myalgias, morning stiffness and myalgias. Negative for joint swelling, muscle weakness and muscle tenderness.  Skin: Positive for color change. Negative for rash, hair loss, redness, skin tightness, ulcers and sensitivity to sunlight.  Allergic/Immunologic: Negative for susceptible to infections.  Neurological: Positive for memory loss. Negative for dizziness, numbness, headaches, night sweats and weakness.  Hematological: Positive for bruising/bleeding tendency. Negative for swollen glands.  Psychiatric/Behavioral: Negative for depressed mood, confusion and sleep disturbance. The patient is nervous/anxious.     PMFS History:  Patient Active Problem List   Diagnosis Date Noted  . Encounter for medication management 09/22/2019  . Arthralgia 07/02/2019  . Metatarsalgia of left foot 04/23/2019  . Ulcer of toe ([place]) 04/23/2019  . Panic attack as reaction to stress 04/05/2019  . Medication management 12/29/2018  .  Muscle spasms of back due to scoliosis 05/27/2018  . Chronic back pain 05/27/2018  . Atypical chest pain 02/17/2018  . Primary osteoarthritis of both hands 10/21/2017  . DDD (degenerative disc disease), lumbar 10/21/2017  . History of COPD 10/01/2017  . Former smoker 10/01/2017  . Pain in joint of right shoulder 06/25/2017  . Trochanteric bursitis of right hip 06/25/2017  . Primary [name] disease without gangrene-  hands and feet 06/02/2017  . Iron deficiency anemia, unspecified 04/23/2017  . GAD (generalized anxiety disorder) 04/23/2017  . Insomnia 04/23/2017  . S/P right TH revision 09/24/2016  . Medication refused- (any cholesterol ones) 01/20/2016  . Environmental and seasonal  allergies 01/20/2016  . Chronic fatigue syndrome with fibromyalgia 01/20/2016  . Vitamin D deficiency 01/19/2016  . Prediabetes- 6.0 12/17 01/11/2016  . Adjustment  disorder with mixed anxiety and depressed mood 01/11/2016  . Medication intolerance- statins- (make her FM much W) 01/10/2016  . Depression 11/24/2015  . Reaction, situational, acute, to stress(husband dying) 09/21/2015  . B12 deficiency 08/30/2015  . Dysuria 08/18/2015  . Chronic Fatigue- 2ndary to FM and Mood d/o  06/20/2015  . Memory loss   . Lumbar radiculopathy, chronic   . Herpes simplex labialis 04/03/2012  . Shingles 04/12/2011  . LBBB (left bundle branch block) 11/13/2010  . MIGRAINE HEADACHE 05/30/2010  . Iron deficiency anemia 11/03/2009  . Asthma, mild persistent 12/19/2008  . Hyperlipidemia 08/12/2007  . Anxiety state 08/12/2007  . Essential hypertension 08/12/2007  . Allergic rhinitis 08/12/2007  . G E R D 08/12/2007  . ARTHRITIS 08/12/2007  . Muscle pain, fibromyalgia 08/12/2007    Past Medical History:  Diagnosis Date  . ALLERGIC RHINITIS   . ANEMIA, IRON DEFICIENCY   . ANXIETY   . ARTHRITIS    s/p bilateral THR  . ASTHMA, EXTRINSIC   . Bronchitis   . Cataracts, both eyes   . Chronic constipation   . FIBROMYALGIA    fibromyalgia  . G E R D   . High cholesterol   . HYPERLIPIDEMIA   . HYPERTENSION   . Interstitial cystitis   . MIGRAINE HEADACHE   . Panic attack    per patient     Family History  Problem Relation Age of Onset  . Arthritis Mother   . Hypertension Mother   . Heart disease Father   . Arthritis Father   . Hypertension Father   . Depression Sister   . Hyperlipidemia Sister   . Hypertension Other   . Hyperlipidemia Other    Past Surgical History:  Procedure Laterality Date  . ABDOMINAL HYSTERECTOMY    . ANTERIOR HIP REVISION Right 09/24/2016   Procedure: RIGHT HIP ACETABULUM AND FEMORAL HEAD REVISION WITH BONE GRAFT;  Surgeon: Paralee Cancel, MD;  Location: [facility];  Service: Orthopedics;  Laterality: Right;  . APPENDECTOMY    . BACK SURGERY    . CATARACT EXTRACTION    . COLONOSCOPY    . DOPPLER ECHOCARDIOGRAPHY    .  ESOPHAGOGASTRODUODENOSCOPY ENDOSCOPY    . JOINT REPLACEMENT    . LUMBAR DISC SURGERY    . NM MYOVIEW LTD     stress test  . Ovarian cyst removed    . ROTATOR CUFF REPAIR Left 5-6 years ago   Dr Onnie Graham; done at [facility] surgery center   . TOTAL HIP ARTHROPLASTY    . TOTAL SHOULDER ARTHROPLASTY     Social History   Social History Narrative   Lives with husband in a 3 story home.  Has 2 children.  Retired Glass blower/designer.  Education: Scientist, water quality.   Immunization History  Administered Date(s) Administered  . Fluad Quad(high Dose 65+) 12/16/2018  . Influenza Split 01/03/2012  . Influenza Whole 12/19/2008, 12/23/2009, 11/13/2010  . Influenza, High Dose Seasonal PF 12/13/2013, 01/10/2015, 01/11/2016, 01/03/2017, 12/19/2017  . Influenza,inj,Quad PF,6+ Mos 11/19/2012  . Influenza,inj,quad, With Preservative 12/23/2016, 01/26/2018  . Influenza-Unspecified 01/10/2015, 01/03/2017  . PFIZER SARS-COV-2 Vaccination 04/15/2019, 05/03/2019  . Pneumococcal Conjugate-13 01/28/2013  . Pneumococcal Polysaccharide-23 12/24/2007  . Tdap 11/13/2010  . Zoster Recombinat (Shingrix) 09/08/2018, 11/19/2018     Objective: Vital Signs: BP 127/70 (BP Location: Left Arm, Patient Position: Sitting, Cuff  Size: Normal)   Pulse 63   Resp 14   Ht 5\' 2"  (1.575 m)   Wt 135 lb 6.4 oz (61.4 kg)   BMI 24.76 kg/m    Physical Exam Vitals and nursing note reviewed.  Constitutional:      Appearance: She is well-developed.  HENT:     Head: Normocephalic and atraumatic.  Eyes:     Conjunctiva/sclera: Conjunctivae normal.  Cardiovascular:     Rate and Rhythm: Normal rate and regular rhythm.     Heart sounds: Normal heart sounds.  Pulmonary:     Effort: Pulmonary effort is normal.     Breath sounds: Normal breath sounds.  Abdominal:     General: Bowel sounds are normal.     Palpations: Abdomen is soft.  Musculoskeletal:     Cervical back: Normal range of motion.  Lymphadenopathy:     Cervical: No cervical  adenopathy.  Skin:    General: Skin is warm and dry.     Capillary Refill: Capillary refill takes 2 to 3 seconds.  Neurological:     Mental Status: She is alert and oriented to person, place, and time.  Psychiatric:        Behavior: Behavior normal.      Musculoskeletal Exam: C-spine is in good range of motion.  She has limited painful range of motion of the lumbar spine.  She has left shoulder abduction limited to 90 degrees.  She has severe DIP PIP and CMC arthritis in her hands with no synovitis.  Hip joints have limited range of motion which is replaced.  Knee joints with good range of motion.  She has bilateral hammertoes and pes planus.  CDAI Exam: CDAI Score: -- Patient Global: --; Provider Global: -- Swollen: --; Tender: -- Joint Exam 10/04/2019   No joint exam has been documented for this visit   There is currently no information documented on the homunculus. Go to the Rheumatology activity and complete the homunculus joint exam.  Investigation: No additional findings.  Imaging: No results found.  Recent Labs: Lab Results  Component Value Date   WBC 7.6 03/29/2019   HGB 14.4 03/29/2019   PLT 232 03/29/2019   NA 134 09/20/2019   K 5.0 09/20/2019   CL 94 (L) 09/20/2019   CO2 26 09/20/2019   GLUCOSE 89 09/20/2019   BUN 14 09/20/2019   CREATININE 0.75 09/20/2019   BILITOT 0.4 09/20/2019   ALKPHOS 72 09/20/2019   AST 17 09/20/2019   ALT 14 09/20/2019   PROT 6.9 09/20/2019   ALBUMIN 4.8 (H) 09/20/2019   CALCIUM 9.9 09/20/2019   GFRAA 85 09/20/2019    Speciality Comments: No specialty comments available.  Procedures:  No procedures performed Allergies: Morphine and Morphine sulfate   Assessment / Plan:     Visit Diagnoses: [name] disease without gangrene - History of severe [name], ANA 1: 80 NO, ENA negative, cryo negative, RF negative, pan-ANCA negative, hepatitis B and C- all autoimmune work-up is negative.  Her hands and feet were cold to touch  with decreased capillary refill.  She has no digital ulcers.  She is on amlodipine by Dr. Alvester Chou.  Keeping core temperature warm was discussed.  Using socks and gloves as needed were also discussed.  Primary osteoarthritis of both hands - severe.  Joint protection muscle strengthening was discussed.  Primary osteoarthritis of both feet-I examined her feet today and discussed that she has hammertoes and pes planus.  She is wearing proper fitting shoes with metatarsal  support.  S/P right TH revision - 1999 and 2018 -followed by orthopedics.  She has limited range of motion of bilateral hip joints.  History of total replacement of both hip joints  DDD (degenerative disc disease), lumbar - Status post surgery x2.  She continues to have a lot of discomfort in her lower back.  Fibromyalgia-she has generalized pain and hyperalgesia.  Vitamin D deficiency-she is on vitamin D supplement.  Other fatigue  LBBB (left bundle branch block)  History of hyperlipidemia  History of COPD  Essential hypertension  History of gastroesophageal reflux (GERD)  History of migraine  Former smoker - Quit smoking 40 years ago.   Orders: No orders of the defined types were placed in this encounter.  No orders of the defined types were placed in this encounter.     Follow-Up Instructions: Return in about 1 year (around 10/03/2020) for raynaud's, OA,DDD.   Bo Merino, MD  Note - This record has been created using Editor, commissioning.  Chart creation errors have been sought, but may not always  have been located. Such creation errors do not reflect on  the standard of medical care.

## 2019-09-22 ENCOUNTER — Encounter: Payer: Self-pay | Admitting: Pharmacist

## 2019-09-22 DIAGNOSIS — Z79899 Other long term (current) drug therapy: Secondary | ICD-10-CM | POA: Insufficient documentation

## 2019-09-22 NOTE — Assessment & Plan Note (Signed)
We discussed purpose of LDL lowering therapy and indication for PCSK9 inhibitors. She understands risks of elevated LDL and reports working on primary prevention for the last 40 years. No contraindications or significant interactions expected betweenRepatha/Praluent and current therapy. Patient prefers to hold on initiating any injectables and will NOT like trial with Nexletol either. Will consider re-scheduling appointment with DR Hilty "to see what he has to say" , but doubtful on initiating lipid management in the near future.

## 2019-09-22 NOTE — Assessment & Plan Note (Signed)
PCP currently working on adjusting SSRI, SNRI , and benzodiazepine therapy. No changes recommended.

## 2019-09-22 NOTE — Assessment & Plan Note (Signed)
Blood pressure remains above goal but patient not comfortable making changes in therapy today. She discussed BP goal with cardiologist and consider anything below 388 sytolic to be acceptable. Denies problems with current therapy and will like yto take less medication is possible. Patient also suffers chronic pain that may be causing increase in blood pressure.

## 2019-09-22 NOTE — Assessment & Plan Note (Signed)
Instructions to change cetrizine 10mg  to bedtime and wean-off omeprazole provided during OV. Patient will apply changes as soon as possible.

## 2019-10-04 ENCOUNTER — Other Ambulatory Visit: Payer: Self-pay

## 2019-10-04 ENCOUNTER — Ambulatory Visit (INDEPENDENT_AMBULATORY_CARE_PROVIDER_SITE_OTHER): Payer: Medicare Other | Admitting: Rheumatology

## 2019-10-04 ENCOUNTER — Encounter: Payer: Self-pay | Admitting: Rheumatology

## 2019-10-04 VITALS — BP 127/70 | HR 63 | Resp 14 | Ht 62.0 in | Wt 135.4 lb

## 2019-10-04 DIAGNOSIS — M19071 Primary osteoarthritis, right ankle and foot: Secondary | ICD-10-CM

## 2019-10-04 DIAGNOSIS — R5383 Other fatigue: Secondary | ICD-10-CM

## 2019-10-04 DIAGNOSIS — Z8719 Personal history of other diseases of the digestive system: Secondary | ICD-10-CM

## 2019-10-04 DIAGNOSIS — Z96649 Presence of unspecified artificial hip joint: Secondary | ICD-10-CM | POA: Diagnosis not present

## 2019-10-04 DIAGNOSIS — M19041 Primary osteoarthritis, right hand: Secondary | ICD-10-CM | POA: Diagnosis not present

## 2019-10-04 DIAGNOSIS — M19042 Primary osteoarthritis, left hand: Secondary | ICD-10-CM

## 2019-10-04 DIAGNOSIS — I10 Essential (primary) hypertension: Secondary | ICD-10-CM

## 2019-10-04 DIAGNOSIS — M5136 Other intervertebral disc degeneration, lumbar region: Secondary | ICD-10-CM

## 2019-10-04 DIAGNOSIS — M797 Fibromyalgia: Secondary | ICD-10-CM

## 2019-10-04 DIAGNOSIS — Z8639 Personal history of other endocrine, nutritional and metabolic disease: Secondary | ICD-10-CM

## 2019-10-04 DIAGNOSIS — I7300 Raynaud's syndrome without gangrene: Secondary | ICD-10-CM

## 2019-10-04 DIAGNOSIS — Z87891 Personal history of nicotine dependence: Secondary | ICD-10-CM

## 2019-10-04 DIAGNOSIS — Z8709 Personal history of other diseases of the respiratory system: Secondary | ICD-10-CM

## 2019-10-04 DIAGNOSIS — E559 Vitamin D deficiency, unspecified: Secondary | ICD-10-CM

## 2019-10-04 DIAGNOSIS — M19072 Primary osteoarthritis, left ankle and foot: Secondary | ICD-10-CM

## 2019-10-04 DIAGNOSIS — I447 Left bundle-branch block, unspecified: Secondary | ICD-10-CM

## 2019-10-04 DIAGNOSIS — Z96643 Presence of artificial hip joint, bilateral: Secondary | ICD-10-CM

## 2019-10-04 DIAGNOSIS — Z8669 Personal history of other diseases of the nervous system and sense organs: Secondary | ICD-10-CM

## 2019-10-14 DIAGNOSIS — M9903 Segmental and somatic dysfunction of lumbar region: Secondary | ICD-10-CM | POA: Diagnosis not present

## 2019-10-14 DIAGNOSIS — M9905 Segmental and somatic dysfunction of pelvic region: Secondary | ICD-10-CM | POA: Diagnosis not present

## 2019-10-14 DIAGNOSIS — M5136 Other intervertebral disc degeneration, lumbar region: Secondary | ICD-10-CM | POA: Diagnosis not present

## 2019-10-14 DIAGNOSIS — M9904 Segmental and somatic dysfunction of sacral region: Secondary | ICD-10-CM | POA: Diagnosis not present

## 2019-10-18 ENCOUNTER — Telehealth: Payer: Self-pay

## 2019-10-18 NOTE — Telephone Encounter (Signed)
Received faxed refill request for escitalopram 5mg .  However, it was discontinued from the pt's medication list and I am unsure why.  Please review for appropriateness of refill request and send new RX to pharmacy if appropriate.  Charyl Bigger, CMA

## 2019-10-19 ENCOUNTER — Other Ambulatory Visit: Payer: Self-pay | Admitting: Physician Assistant

## 2019-10-19 DIAGNOSIS — F32A Depression, unspecified: Secondary | ICD-10-CM

## 2019-10-19 DIAGNOSIS — F411 Generalized anxiety disorder: Secondary | ICD-10-CM

## 2019-10-19 MED ORDER — ESCITALOPRAM OXALATE 5 MG PO TABS: 5.0000 mg | ORAL_TABLET | Freq: Every day | ORAL | 1 refills | Status: DC

## 2019-10-19 NOTE — Telephone Encounter (Signed)
Per chart review, looks like medication was accidentally removed from med list by another office. Sent new rx in.  Thank you, Herb Grays

## 2019-10-21 DIAGNOSIS — M9905 Segmental and somatic dysfunction of pelvic region: Secondary | ICD-10-CM | POA: Diagnosis not present

## 2019-10-21 DIAGNOSIS — M9904 Segmental and somatic dysfunction of sacral region: Secondary | ICD-10-CM | POA: Diagnosis not present

## 2019-10-21 DIAGNOSIS — M5136 Other intervertebral disc degeneration, lumbar region: Secondary | ICD-10-CM | POA: Diagnosis not present

## 2019-10-21 DIAGNOSIS — M9903 Segmental and somatic dysfunction of lumbar region: Secondary | ICD-10-CM | POA: Diagnosis not present

## 2019-10-25 ENCOUNTER — Other Ambulatory Visit: Payer: Self-pay | Admitting: Cardiovascular Disease

## 2019-10-28 DIAGNOSIS — M9905 Segmental and somatic dysfunction of pelvic region: Secondary | ICD-10-CM | POA: Diagnosis not present

## 2019-10-28 DIAGNOSIS — M5136 Other intervertebral disc degeneration, lumbar region: Secondary | ICD-10-CM | POA: Diagnosis not present

## 2019-10-28 DIAGNOSIS — M9904 Segmental and somatic dysfunction of sacral region: Secondary | ICD-10-CM | POA: Diagnosis not present

## 2019-10-28 DIAGNOSIS — M9903 Segmental and somatic dysfunction of lumbar region: Secondary | ICD-10-CM | POA: Diagnosis not present

## 2019-11-05 ENCOUNTER — Ambulatory Visit: Payer: PRIVATE HEALTH INSURANCE | Admitting: Physician Assistant

## 2019-11-11 DIAGNOSIS — M9905 Segmental and somatic dysfunction of pelvic region: Secondary | ICD-10-CM | POA: Diagnosis not present

## 2019-11-11 DIAGNOSIS — M5136 Other intervertebral disc degeneration, lumbar region: Secondary | ICD-10-CM | POA: Diagnosis not present

## 2019-11-11 DIAGNOSIS — M9904 Segmental and somatic dysfunction of sacral region: Secondary | ICD-10-CM | POA: Diagnosis not present

## 2019-11-11 DIAGNOSIS — M9903 Segmental and somatic dysfunction of lumbar region: Secondary | ICD-10-CM | POA: Diagnosis not present

## 2019-11-12 ENCOUNTER — Telehealth: Payer: Self-pay | Admitting: Internal Medicine

## 2019-11-12 NOTE — Telephone Encounter (Signed)
Patient called and left VM asking to speak with nurse. She states that she went to pick up all of her prescriptions for the next few months that all of her providers have prescribed. She states there was a med that she was on at one time (sshe did not specify the med in question) but says she no longer takes it. She wants to verify with the nurse if this is accurate. Please contact when available.

## 2019-11-12 NOTE — Telephone Encounter (Signed)
Called patient and she said that she is no longer a pt here and does not need any more refills from [name]. AS, CMA

## 2019-11-18 DIAGNOSIS — M9904 Segmental and somatic dysfunction of sacral region: Secondary | ICD-10-CM | POA: Diagnosis not present

## 2019-11-18 DIAGNOSIS — M9903 Segmental and somatic dysfunction of lumbar region: Secondary | ICD-10-CM | POA: Diagnosis not present

## 2019-11-18 DIAGNOSIS — M9905 Segmental and somatic dysfunction of pelvic region: Secondary | ICD-10-CM | POA: Diagnosis not present

## 2019-11-18 DIAGNOSIS — M5136 Other intervertebral disc degeneration, lumbar region: Secondary | ICD-10-CM | POA: Diagnosis not present

## 2019-11-19 DIAGNOSIS — Z96641 Presence of right artificial hip joint: Secondary | ICD-10-CM | POA: Diagnosis not present

## 2019-11-19 DIAGNOSIS — M25551 Pain in right hip: Secondary | ICD-10-CM | POA: Diagnosis not present

## 2019-11-25 DIAGNOSIS — M791 Myalgia, unspecified site: Secondary | ICD-10-CM | POA: Diagnosis not present

## 2019-11-25 DIAGNOSIS — M9902 Segmental and somatic dysfunction of thoracic region: Secondary | ICD-10-CM | POA: Diagnosis not present

## 2019-11-25 DIAGNOSIS — F419 Anxiety disorder, unspecified: Secondary | ICD-10-CM | POA: Diagnosis not present

## 2019-11-25 DIAGNOSIS — J4530 Mild persistent asthma, uncomplicated: Secondary | ICD-10-CM | POA: Diagnosis not present

## 2019-11-25 DIAGNOSIS — I129 Hypertensive chronic kidney disease with stage 1 through stage 4 chronic kidney disease, or unspecified chronic kidney disease: Secondary | ICD-10-CM | POA: Diagnosis not present

## 2019-11-25 DIAGNOSIS — Z87891 Personal history of nicotine dependence: Secondary | ICD-10-CM | POA: Diagnosis not present

## 2019-11-25 DIAGNOSIS — M5134 Other intervertebral disc degeneration, thoracic region: Secondary | ICD-10-CM | POA: Diagnosis not present

## 2019-11-25 DIAGNOSIS — F339 Major depressive disorder, recurrent, unspecified: Secondary | ICD-10-CM | POA: Diagnosis not present

## 2019-11-25 DIAGNOSIS — M79672 Pain in left foot: Secondary | ICD-10-CM | POA: Diagnosis not present

## 2019-11-25 DIAGNOSIS — Z23 Encounter for immunization: Secondary | ICD-10-CM | POA: Diagnosis not present

## 2019-11-25 DIAGNOSIS — M9903 Segmental and somatic dysfunction of lumbar region: Secondary | ICD-10-CM | POA: Diagnosis not present

## 2019-11-25 DIAGNOSIS — M5136 Other intervertebral disc degeneration, lumbar region: Secondary | ICD-10-CM | POA: Diagnosis not present

## 2019-11-25 DIAGNOSIS — I700 Atherosclerosis of aorta: Secondary | ICD-10-CM | POA: Diagnosis not present

## 2019-11-25 DIAGNOSIS — M79641 Pain in right hand: Secondary | ICD-10-CM | POA: Diagnosis not present

## 2019-12-02 ENCOUNTER — Encounter: Payer: Self-pay | Admitting: Neurology

## 2019-12-03 ENCOUNTER — Ambulatory Visit: Payer: Medicare Other | Admitting: Neurology

## 2019-12-06 ENCOUNTER — Other Ambulatory Visit: Payer: Self-pay | Admitting: Cardiovascular Disease

## 2019-12-06 ENCOUNTER — Other Ambulatory Visit: Payer: Self-pay | Admitting: Internal Medicine

## 2019-12-06 DIAGNOSIS — Z961 Presence of intraocular lens: Secondary | ICD-10-CM | POA: Diagnosis not present

## 2019-12-06 DIAGNOSIS — H40013 Open angle with borderline findings, low risk, bilateral: Secondary | ICD-10-CM | POA: Diagnosis not present

## 2019-12-06 DIAGNOSIS — H04123 Dry eye syndrome of bilateral lacrimal glands: Secondary | ICD-10-CM | POA: Diagnosis not present

## 2019-12-06 DIAGNOSIS — H35372 Puckering of macula, left eye: Secondary | ICD-10-CM | POA: Diagnosis not present

## 2019-12-08 DIAGNOSIS — Z23 Encounter for immunization: Secondary | ICD-10-CM | POA: Diagnosis not present

## 2019-12-09 DIAGNOSIS — M9903 Segmental and somatic dysfunction of lumbar region: Secondary | ICD-10-CM | POA: Diagnosis not present

## 2019-12-09 DIAGNOSIS — M5134 Other intervertebral disc degeneration, thoracic region: Secondary | ICD-10-CM | POA: Diagnosis not present

## 2019-12-09 DIAGNOSIS — M5136 Other intervertebral disc degeneration, lumbar region: Secondary | ICD-10-CM | POA: Diagnosis not present

## 2019-12-09 DIAGNOSIS — M9902 Segmental and somatic dysfunction of thoracic region: Secondary | ICD-10-CM | POA: Diagnosis not present

## 2019-12-15 ENCOUNTER — Other Ambulatory Visit: Payer: Self-pay | Admitting: Internal Medicine

## 2019-12-15 DIAGNOSIS — E782 Mixed hyperlipidemia: Secondary | ICD-10-CM

## 2019-12-16 DIAGNOSIS — M5136 Other intervertebral disc degeneration, lumbar region: Secondary | ICD-10-CM | POA: Diagnosis not present

## 2019-12-16 DIAGNOSIS — M9903 Segmental and somatic dysfunction of lumbar region: Secondary | ICD-10-CM | POA: Diagnosis not present

## 2019-12-16 DIAGNOSIS — M5134 Other intervertebral disc degeneration, thoracic region: Secondary | ICD-10-CM | POA: Diagnosis not present

## 2019-12-16 DIAGNOSIS — M9902 Segmental and somatic dysfunction of thoracic region: Secondary | ICD-10-CM | POA: Diagnosis not present

## 2019-12-16 DIAGNOSIS — Z23 Encounter for immunization: Secondary | ICD-10-CM | POA: Diagnosis not present

## 2019-12-23 DIAGNOSIS — M9903 Segmental and somatic dysfunction of lumbar region: Secondary | ICD-10-CM | POA: Diagnosis not present

## 2019-12-23 DIAGNOSIS — M9902 Segmental and somatic dysfunction of thoracic region: Secondary | ICD-10-CM | POA: Diagnosis not present

## 2019-12-23 DIAGNOSIS — M5134 Other intervertebral disc degeneration, thoracic region: Secondary | ICD-10-CM | POA: Diagnosis not present

## 2019-12-23 DIAGNOSIS — M5136 Other intervertebral disc degeneration, lumbar region: Secondary | ICD-10-CM | POA: Diagnosis not present

## 2019-12-24 ENCOUNTER — Ambulatory Visit: Payer: Medicare Other | Admitting: Internal Medicine

## 2019-12-30 DIAGNOSIS — M5134 Other intervertebral disc degeneration, thoracic region: Secondary | ICD-10-CM | POA: Diagnosis not present

## 2019-12-30 DIAGNOSIS — M5136 Other intervertebral disc degeneration, lumbar region: Secondary | ICD-10-CM | POA: Diagnosis not present

## 2019-12-30 DIAGNOSIS — M25551 Pain in right hip: Secondary | ICD-10-CM | POA: Diagnosis not present

## 2019-12-30 DIAGNOSIS — M9903 Segmental and somatic dysfunction of lumbar region: Secondary | ICD-10-CM | POA: Diagnosis not present

## 2019-12-30 DIAGNOSIS — M9902 Segmental and somatic dysfunction of thoracic region: Secondary | ICD-10-CM | POA: Diagnosis not present

## 2020-01-06 DIAGNOSIS — M5134 Other intervertebral disc degeneration, thoracic region: Secondary | ICD-10-CM | POA: Diagnosis not present

## 2020-01-06 DIAGNOSIS — M9903 Segmental and somatic dysfunction of lumbar region: Secondary | ICD-10-CM | POA: Diagnosis not present

## 2020-01-06 DIAGNOSIS — M5136 Other intervertebral disc degeneration, lumbar region: Secondary | ICD-10-CM | POA: Diagnosis not present

## 2020-01-06 DIAGNOSIS — M9902 Segmental and somatic dysfunction of thoracic region: Secondary | ICD-10-CM | POA: Diagnosis not present

## 2020-01-20 DIAGNOSIS — M9903 Segmental and somatic dysfunction of lumbar region: Secondary | ICD-10-CM | POA: Diagnosis not present

## 2020-01-20 DIAGNOSIS — M5136 Other intervertebral disc degeneration, lumbar region: Secondary | ICD-10-CM | POA: Diagnosis not present

## 2020-01-20 DIAGNOSIS — M9902 Segmental and somatic dysfunction of thoracic region: Secondary | ICD-10-CM | POA: Diagnosis not present

## 2020-01-20 DIAGNOSIS — M5134 Other intervertebral disc degeneration, thoracic region: Secondary | ICD-10-CM | POA: Diagnosis not present

## 2020-01-27 DIAGNOSIS — M5136 Other intervertebral disc degeneration, lumbar region: Secondary | ICD-10-CM | POA: Diagnosis not present

## 2020-02-07 ENCOUNTER — Ambulatory Visit: Payer: Medicare Other | Admitting: Internal Medicine

## 2020-02-07 ENCOUNTER — Other Ambulatory Visit: Payer: Self-pay | Admitting: Internal Medicine

## 2020-02-08 ENCOUNTER — Other Ambulatory Visit: Payer: Self-pay

## 2020-02-08 ENCOUNTER — Ambulatory Visit (INDEPENDENT_AMBULATORY_CARE_PROVIDER_SITE_OTHER): Payer: Medicare Other | Admitting: Pulmonary Disease

## 2020-02-08 ENCOUNTER — Encounter: Payer: Self-pay | Admitting: Pulmonary Disease

## 2020-02-08 ENCOUNTER — Other Ambulatory Visit (INDEPENDENT_AMBULATORY_CARE_PROVIDER_SITE_OTHER): Payer: Medicare Other

## 2020-02-08 VITALS — BP 126/72 | HR 83 | Temp 97.6°F | Ht 64.0 in | Wt 134.0 lb

## 2020-02-08 DIAGNOSIS — F419 Anxiety disorder, unspecified: Secondary | ICD-10-CM | POA: Diagnosis not present

## 2020-02-08 DIAGNOSIS — R61 Generalized hyperhidrosis: Secondary | ICD-10-CM | POA: Diagnosis not present

## 2020-02-08 DIAGNOSIS — J4520 Mild intermittent asthma, uncomplicated: Secondary | ICD-10-CM

## 2020-02-08 LAB — TSH: TSH: 1.93 u[IU]/mL (ref 0.35–4.50)

## 2020-02-08 MED ORDER — ALBUTEROL SULFATE HFA 108 (90 BASE) MCG/ACT IN AERS: 2.0000 | INHALATION_SPRAY | Freq: Four times a day (QID) | RESPIRATORY_TRACT | 11 refills | Status: DC | PRN

## 2020-02-08 NOTE — Progress Notes (Addendum)
Synopsis: Longstanding patient of Dr. Lake Bells managed here for asthma  Subjective:   PATIENT ID: [name] GENDER: female DOB: [birth year], MRN: [id number]  Chief Complaint  Patient presents with  . Follow-up    SOB and tires easily     HPI Here for asthma, recurrent sinus congestion complicated by anxiety disorder. Regimen consists of symbicort 80/4.5 BID, singulair, omeprazole, flonase, zyrtec, PRN xanax. Symptoms stable, not used PEN albuterol in months. Down to 1/4 tab (0.0625) of 0.25 mg xanax BID. Occasional central chest tightness that self resolves.   Describes increase fatigue, easy sweating new over last few months. No weight loss.   Labs reviewed and notable for:  IgE 3 2017, 4 2009 Eos not elevated  ROS No orthopnea or PND. No chest pain. Comprehensive review of systems .   Objective:  GEN: elderly woman in NAD HEENT: MMM, trachea midline CV: RRR, ext warm PULM: clear in all lung fields, no accessory muscle use GI: Soft, +BS EXT: no edema PSYCH: AOx3, anxious SKIN: No rashes    Vitals:   02/08/20 1044  BP: 126/72  Pulse: 83  Temp: 97.6 F (36.4 C)  TempSrc: Oral  SpO2: 99%  Weight: 134 lb (60.8 kg)  Height: 5\' 4"  (1.626 m)   99% on RA BMI Readings from Last 3 Encounters:  02/08/20 23.00 kg/m  10/04/19 24.76 kg/m  09/20/19 24.87 kg/m   Wt Readings from Last 3 Encounters:  02/08/20 134 lb (60.8 kg)  10/04/19 135 lb 6.4 oz (61.4 kg)  09/20/19 136 lb (61.7 kg)     CBC    Component Value Date/Time   WBC 7.6 03/29/2019 1046   WBC 7.0 10/01/2017 1114   RBC 4.67 03/29/2019 1046   RBC 4.61 10/01/2017 1114   HGB 14.4 03/29/2019 1046   HGB 12.6 05/20/2011 1331   HCT 42.3 03/29/2019 1046   HCT 37.5 05/20/2011 1331   PLT 232 03/29/2019 1046   MCV 91 03/29/2019 1046   MCV 93.7 05/20/2011 1331   MCH 30.8 03/29/2019 1046   MCH 29.9 10/01/2017 1114   MCHC 34.0 03/29/2019 1046   MCHC 33.6 10/01/2017 1114   RDW 12.4 03/29/2019 1046    RDW 15.3 (H) 05/20/2011 1331   LYMPHSABS 1.2 03/29/2019 1046   LYMPHSABS 1.3 05/20/2011 1331   MONOABS 1.2 (H) 09/07/2016 1725   MONOABS 0.4 05/20/2011 1331   EOSABS 0.1 03/29/2019 1046   BASOSABS 0.0 03/29/2019 1046   BASOSABS 0.0 05/20/2011 1331    Chest Imaging: CXR 2019 - clear lungs, mild hyperinflation on my interpretation   Pulmonary Functions Testing Results: PFT Results Latest Ref Rng & Units 08/18/2015  FVC-Pre L 2.43  FVC-Predicted Pre % 98  FVC-Post L 2.45  FVC-Predicted Post % 98  Pre FEV1/FVC % % 84  Post FEV1/FCV % % 86  FEV1-Pre L 2.05  FEV1-Predicted Pre % 111  FEV1-Post L 2.10  DLCO uncorrected ml/min/mmHg 17.47  DLCO UNC% % 76  DLCO corrected ml/min/mmHg 17.02  DLCO COR %Predicted % 74  DLVA Predicted % 88  TLC L 4.57  TLC % Predicted % 93  RV % Predicted % 92  Interpretation: 2017 WNL, moderate fixed obstruction in 9357  Echo diastolic dysfunction 0177 Nuclear stress WNL 2019   Assessment & Plan:   # Mild persistent asthma with superimposed panic attack disorder # Panic attacks worsened by frequent albuterol and prednisone Rx; using xanax has prevented her from needing these meds, using 1/4 tab of 0.25 mg tablet  twice a day, cutting down # Fibromyalgia on multiple mood-affecting medications # Sweating, fatigue: Competing symptoms of possible hyperthyroid and hypothyroid - thyroid studies repeated and normal in past. Will send message to PCP.   Discussion: - Continue symbicort 80/4.5 BID and albuterol PRN -TSH, free T4 -Continue low dose xanax for now, will discuss with PCP in terms of taking over prescibing     Current Outpatient Medications:  .  acetaminophen (TYLENOL) 500 MG tablet, Take by mouth as needed., Disp: , Rfl:  .  albuterol (PROAIR HFA) 108 (90 Base) MCG/ACT inhaler, Inhale 2 puffs into the lungs every 6 (six) hours as needed for wheezing or shortness of breath., Disp: 1 each, Rfl: 11 .  ALPRAZolam (XANAX) 0.25 MG tablet, Take 1  tablet (0.25 mg total) by mouth 2 (two) times daily as needed for anxiety. Patient states shes taking 1 tablet BID every day, Disp: 60 tablet, Rfl: 5 .  amLODipine (NORVASC) 5 MG tablet, TAKE 1 TABLET BY MOUTH ONCE DAILY, Disp: 90 tablet, Rfl: 1 .  busPIRone (BUSPAR) 15 MG tablet, Take 0.5 tablets (7.5 mg total) by mouth 2 (two) times daily. Patient reports shes taking 1/2 tablet BID, Disp: 90 tablet, Rfl: 1 .  cetirizine (ZYRTEC) 10 MG tablet, Take 10 mg by mouth daily. , Disp: , Rfl:  .  chlorthalidone (HYGROTON) 25 MG tablet, TAKE 1/2 TABLET BY MOUTH DAILY, Disp: 45 tablet, Rfl: 9 .  Cholecalciferol (VITAMIN D3) 5000 units TABS, 5,000 IU OTC vitamin D3 daily., Disp: 90 tablet, Rfl: 3 .  Cyanocobalamin (VITAMIN B-12) 2500 MCG TABS, Take 1,250 mcg by mouth daily., Disp: , Rfl:  .  docusate sodium (COLACE) 100 MG capsule, Take 1 capsule (100 mg total) by mouth 2 (two) times daily as needed for mild constipation., Disp: 10 capsule, Rfl: 0 .  DULoxetine (CYMBALTA) 30 MG capsule, Take 1 capsule (30 mg total) by mouth daily. (Patient taking differently: Take 60 mg by mouth 2 (two) times daily. ), Disp: 90 capsule, Rfl: 0 .  escitalopram (LEXAPRO) 5 MG tablet, Take 1 tablet (5 mg total) by mouth daily., Disp: 90 tablet, Rfl: 1 .  ferrous sulfate (FERROUSUL) 325 (65 FE) MG tablet, Take 1 tablet (325 mg total) by mouth 3 (three) times daily with meals. (Patient taking differently: Take 325 mg by mouth 2 (two) times daily with a meal. ), Disp: , Rfl:  .  fluticasone (FLONASE) 50 MCG/ACT nasal spray, USE 2 SPRAYS INTO EACH NOSTRIL EVERY DAY, Disp: 48 g, Rfl: 3 .  losartan (COZAAR) 100 MG tablet, TAKE 1 TABLET BY MOUTH DAILY, Disp: 90 tablet, Rfl: 1 .  methocarbamol (ROBAXIN) 500 MG tablet, as needed., Disp: , Rfl:  .  montelukast (SINGULAIR) 10 MG tablet, TAKE 1 TABLET BY MOUTH ONCE DAILY AT BEDTIME, Disp: 90 tablet, Rfl: 0 .  omeprazole (PRILOSEC) 20 MG capsule, TAKE 1 CAPSULE BY MOUTH DAILY, Disp: 90  capsule, Rfl: 1 .  SYMBICORT 80-4.5 MCG/ACT inhaler, INHALE 2 PUFFS BY MOUTH INTO THE LUNGS TWICE A DAY, Disp: 10.2 g, Rfl: 5 .  traMADol (ULTRAM) 50 MG tablet, Take by mouth as needed., Disp: , Rfl:    Lanier Clam, MD [facility] 02/08/2020 11:10 AM

## 2020-02-08 NOTE — Patient Instructions (Addendum)
Nice to meet you!   Continue symbicort 2 puffs twice a day, I refilled the albuterol  We will check thyroid today to see if causing sweats  Come back for follow up in 1 year with Dr. Silas Flood, sooner if needed.

## 2020-02-09 LAB — T4: T4, Total: 7.5 ug/dL (ref 5.1–11.9)

## 2020-02-09 NOTE — Progress Notes (Signed)
Thyroid studies normal

## 2020-02-10 DIAGNOSIS — M9905 Segmental and somatic dysfunction of pelvic region: Secondary | ICD-10-CM | POA: Diagnosis not present

## 2020-02-10 DIAGNOSIS — M9904 Segmental and somatic dysfunction of sacral region: Secondary | ICD-10-CM | POA: Diagnosis not present

## 2020-02-10 DIAGNOSIS — M9903 Segmental and somatic dysfunction of lumbar region: Secondary | ICD-10-CM | POA: Diagnosis not present

## 2020-02-10 DIAGNOSIS — M5136 Other intervertebral disc degeneration, lumbar region: Secondary | ICD-10-CM | POA: Diagnosis not present

## 2020-02-14 DIAGNOSIS — M47816 Spondylosis without myelopathy or radiculopathy, lumbar region: Secondary | ICD-10-CM | POA: Diagnosis not present

## 2020-02-15 DIAGNOSIS — M9905 Segmental and somatic dysfunction of pelvic region: Secondary | ICD-10-CM | POA: Diagnosis not present

## 2020-02-15 DIAGNOSIS — M5136 Other intervertebral disc degeneration, lumbar region: Secondary | ICD-10-CM | POA: Diagnosis not present

## 2020-02-15 DIAGNOSIS — M9903 Segmental and somatic dysfunction of lumbar region: Secondary | ICD-10-CM | POA: Diagnosis not present

## 2020-02-15 DIAGNOSIS — M9904 Segmental and somatic dysfunction of sacral region: Secondary | ICD-10-CM | POA: Diagnosis not present

## 2020-02-24 DIAGNOSIS — M9905 Segmental and somatic dysfunction of pelvic region: Secondary | ICD-10-CM | POA: Diagnosis not present

## 2020-02-24 DIAGNOSIS — M47816 Spondylosis without myelopathy or radiculopathy, lumbar region: Secondary | ICD-10-CM

## 2020-02-24 DIAGNOSIS — M9903 Segmental and somatic dysfunction of lumbar region: Secondary | ICD-10-CM | POA: Diagnosis not present

## 2020-02-24 DIAGNOSIS — M9904 Segmental and somatic dysfunction of sacral region: Secondary | ICD-10-CM | POA: Diagnosis not present

## 2020-02-24 DIAGNOSIS — M5136 Other intervertebral disc degeneration, lumbar region: Secondary | ICD-10-CM | POA: Diagnosis not present

## 2020-02-24 HISTORY — DX: Spondylosis without myelopathy or radiculopathy, lumbar region: M47.816

## 2020-02-29 DIAGNOSIS — M47816 Spondylosis without myelopathy or radiculopathy, lumbar region: Secondary | ICD-10-CM | POA: Diagnosis not present

## 2020-03-06 ENCOUNTER — Ambulatory Visit: Payer: Medicare Other | Admitting: Neurology

## 2020-03-09 DIAGNOSIS — M5136 Other intervertebral disc degeneration, lumbar region: Secondary | ICD-10-CM | POA: Diagnosis not present

## 2020-03-09 DIAGNOSIS — M9905 Segmental and somatic dysfunction of pelvic region: Secondary | ICD-10-CM | POA: Diagnosis not present

## 2020-03-09 DIAGNOSIS — M9904 Segmental and somatic dysfunction of sacral region: Secondary | ICD-10-CM | POA: Diagnosis not present

## 2020-03-09 DIAGNOSIS — M9903 Segmental and somatic dysfunction of lumbar region: Secondary | ICD-10-CM | POA: Diagnosis not present

## 2020-03-15 DIAGNOSIS — M9905 Segmental and somatic dysfunction of pelvic region: Secondary | ICD-10-CM | POA: Diagnosis not present

## 2020-03-15 DIAGNOSIS — M9903 Segmental and somatic dysfunction of lumbar region: Secondary | ICD-10-CM | POA: Diagnosis not present

## 2020-03-15 DIAGNOSIS — M9904 Segmental and somatic dysfunction of sacral region: Secondary | ICD-10-CM | POA: Diagnosis not present

## 2020-03-15 DIAGNOSIS — M5136 Other intervertebral disc degeneration, lumbar region: Secondary | ICD-10-CM | POA: Diagnosis not present

## 2020-03-28 DIAGNOSIS — R61 Generalized hyperhidrosis: Secondary | ICD-10-CM | POA: Diagnosis not present

## 2020-03-28 DIAGNOSIS — F419 Anxiety disorder, unspecified: Secondary | ICD-10-CM | POA: Diagnosis not present

## 2020-03-28 DIAGNOSIS — I129 Hypertensive chronic kidney disease with stage 1 through stage 4 chronic kidney disease, or unspecified chronic kidney disease: Secondary | ICD-10-CM | POA: Diagnosis not present

## 2020-03-28 DIAGNOSIS — I700 Atherosclerosis of aorta: Secondary | ICD-10-CM | POA: Diagnosis not present

## 2020-03-28 DIAGNOSIS — Z13820 Encounter for screening for osteoporosis: Secondary | ICD-10-CM | POA: Diagnosis not present

## 2020-03-28 DIAGNOSIS — Z87891 Personal history of nicotine dependence: Secondary | ICD-10-CM | POA: Diagnosis not present

## 2020-03-28 DIAGNOSIS — R7309 Other abnormal glucose: Secondary | ICD-10-CM | POA: Diagnosis not present

## 2020-03-28 DIAGNOSIS — F339 Major depressive disorder, recurrent, unspecified: Secondary | ICD-10-CM | POA: Diagnosis not present

## 2020-03-28 DIAGNOSIS — E785 Hyperlipidemia, unspecified: Secondary | ICD-10-CM | POA: Diagnosis not present

## 2020-03-28 DIAGNOSIS — G729 Myopathy, unspecified: Secondary | ICD-10-CM | POA: Diagnosis not present

## 2020-03-28 DIAGNOSIS — J4530 Mild persistent asthma, uncomplicated: Secondary | ICD-10-CM | POA: Diagnosis not present

## 2020-04-05 DIAGNOSIS — M9903 Segmental and somatic dysfunction of lumbar region: Secondary | ICD-10-CM | POA: Diagnosis not present

## 2020-04-05 DIAGNOSIS — M5136 Other intervertebral disc degeneration, lumbar region: Secondary | ICD-10-CM | POA: Diagnosis not present

## 2020-04-05 DIAGNOSIS — M9904 Segmental and somatic dysfunction of sacral region: Secondary | ICD-10-CM | POA: Diagnosis not present

## 2020-04-05 DIAGNOSIS — M9905 Segmental and somatic dysfunction of pelvic region: Secondary | ICD-10-CM | POA: Diagnosis not present

## 2020-04-13 DIAGNOSIS — M9901 Segmental and somatic dysfunction of cervical region: Secondary | ICD-10-CM | POA: Diagnosis not present

## 2020-04-13 DIAGNOSIS — M5136 Other intervertebral disc degeneration, lumbar region: Secondary | ICD-10-CM | POA: Diagnosis not present

## 2020-04-13 DIAGNOSIS — M9903 Segmental and somatic dysfunction of lumbar region: Secondary | ICD-10-CM | POA: Diagnosis not present

## 2020-04-13 DIAGNOSIS — M50322 Other cervical disc degeneration at C5-C6 level: Secondary | ICD-10-CM | POA: Diagnosis not present

## 2020-04-20 ENCOUNTER — Other Ambulatory Visit: Payer: Self-pay | Admitting: Internal Medicine

## 2020-04-20 DIAGNOSIS — M9903 Segmental and somatic dysfunction of lumbar region: Secondary | ICD-10-CM | POA: Diagnosis not present

## 2020-04-20 DIAGNOSIS — M9901 Segmental and somatic dysfunction of cervical region: Secondary | ICD-10-CM | POA: Diagnosis not present

## 2020-04-20 DIAGNOSIS — M50322 Other cervical disc degeneration at C5-C6 level: Secondary | ICD-10-CM | POA: Diagnosis not present

## 2020-04-20 DIAGNOSIS — M5136 Other intervertebral disc degeneration, lumbar region: Secondary | ICD-10-CM | POA: Diagnosis not present

## 2020-04-27 DIAGNOSIS — M50322 Other cervical disc degeneration at C5-C6 level: Secondary | ICD-10-CM | POA: Diagnosis not present

## 2020-04-27 DIAGNOSIS — M9903 Segmental and somatic dysfunction of lumbar region: Secondary | ICD-10-CM | POA: Diagnosis not present

## 2020-04-27 DIAGNOSIS — M9901 Segmental and somatic dysfunction of cervical region: Secondary | ICD-10-CM | POA: Diagnosis not present

## 2020-04-27 DIAGNOSIS — M5136 Other intervertebral disc degeneration, lumbar region: Secondary | ICD-10-CM | POA: Diagnosis not present

## 2020-04-28 ENCOUNTER — Other Ambulatory Visit: Payer: Self-pay | Admitting: Internal Medicine

## 2020-04-28 DIAGNOSIS — Z13820 Encounter for screening for osteoporosis: Secondary | ICD-10-CM

## 2020-05-11 DIAGNOSIS — M9903 Segmental and somatic dysfunction of lumbar region: Secondary | ICD-10-CM | POA: Diagnosis not present

## 2020-05-11 DIAGNOSIS — M5136 Other intervertebral disc degeneration, lumbar region: Secondary | ICD-10-CM | POA: Diagnosis not present

## 2020-05-11 DIAGNOSIS — M9901 Segmental and somatic dysfunction of cervical region: Secondary | ICD-10-CM | POA: Diagnosis not present

## 2020-05-11 DIAGNOSIS — M50322 Other cervical disc degeneration at C5-C6 level: Secondary | ICD-10-CM | POA: Diagnosis not present

## 2020-05-18 DIAGNOSIS — M50322 Other cervical disc degeneration at C5-C6 level: Secondary | ICD-10-CM | POA: Diagnosis not present

## 2020-05-18 DIAGNOSIS — M9903 Segmental and somatic dysfunction of lumbar region: Secondary | ICD-10-CM | POA: Diagnosis not present

## 2020-05-18 DIAGNOSIS — M9901 Segmental and somatic dysfunction of cervical region: Secondary | ICD-10-CM | POA: Diagnosis not present

## 2020-05-18 DIAGNOSIS — M5136 Other intervertebral disc degeneration, lumbar region: Secondary | ICD-10-CM | POA: Diagnosis not present

## 2020-05-19 ENCOUNTER — Other Ambulatory Visit: Payer: Self-pay | Admitting: Internal Medicine

## 2020-05-19 ENCOUNTER — Other Ambulatory Visit: Payer: Self-pay | Admitting: Cardiovascular Disease

## 2020-05-19 NOTE — Telephone Encounter (Signed)
Dr. Silas Flood, please advise if you are okay refilling med or if this needs to be deferred to pt's PCP.

## 2020-05-25 ENCOUNTER — Other Ambulatory Visit: Payer: Self-pay

## 2020-05-25 ENCOUNTER — Other Ambulatory Visit: Payer: Medicare Other

## 2020-05-25 ENCOUNTER — Ambulatory Visit (INDEPENDENT_AMBULATORY_CARE_PROVIDER_SITE_OTHER): Payer: Medicare Other | Admitting: Neurology

## 2020-05-25 ENCOUNTER — Encounter: Payer: Self-pay | Admitting: Neurology

## 2020-05-25 VITALS — BP 145/79 | HR 81 | Ht 63.0 in | Wt 139.0 lb

## 2020-05-25 DIAGNOSIS — M9901 Segmental and somatic dysfunction of cervical region: Secondary | ICD-10-CM | POA: Diagnosis not present

## 2020-05-25 DIAGNOSIS — M9903 Segmental and somatic dysfunction of lumbar region: Secondary | ICD-10-CM | POA: Diagnosis not present

## 2020-05-25 DIAGNOSIS — G319 Degenerative disease of nervous system, unspecified: Secondary | ICD-10-CM

## 2020-05-25 DIAGNOSIS — M50322 Other cervical disc degeneration at C5-C6 level: Secondary | ICD-10-CM | POA: Diagnosis not present

## 2020-05-25 DIAGNOSIS — M5136 Other intervertebral disc degeneration, lumbar region: Secondary | ICD-10-CM | POA: Diagnosis not present

## 2020-05-25 NOTE — Progress Notes (Signed)
Gets back injections for pain unsure of the name

## 2020-05-25 NOTE — Patient Instructions (Addendum)
MRI brain without contrast  Formal neurocognitive testing

## 2020-05-25 NOTE — Progress Notes (Incomplete)
[facility] Clinic Note - Initial Visit   Date: 05/25/20  [name] MRN: [id number] DOB: 1933/11/23   Dear Dr Marland KitchenJacalyn Lefevre, Jesse Sans, MD:  Thank you for your kind referral of [name] for consultation of ***. Although her history is well known to you, please allow [name] to reiterate it for the purpose of our medical record. The patient was accompanied to the clinic by *** who also provides collateral information.     History of Present Illness: [name] is a 87 y.o. ***-handed female with *** presenting for evaluation of ***.    She has become increasingly forgetful, such as with recipes ***.  She has difficulty with navigating in unfamiliar areas and has got lost ***, she is unable to remember names, she tries to manage her own ADLs and has assistance as needed.  Her daughter lives 2 miles from her.  She is unable to follow the story line when reading. She continues to crochet and knit.  She manages her own finances, but has her daughter oversee. She has some errors managing her medications. She makes lists and uses her calender ***.  She uses a walker when walking outside of the home and a cane at home.     Out-side paper records, electronic medical record, and images have been reviewed where available and summarized as: *** Lab Results  Component Value Date   HGBA1C 6.0 (H) 03/29/2019   Lab Results  Component Value Date   VITAMINB12 1,167 06/30/2019   Lab Results  Component Value Date   TSH 1.93 02/08/2020   Lab Results  Component Value Date   ESRSEDRATE 2 10/01/2017    Past Medical History:  Diagnosis Date  . ALLERGIC RHINITIS   . ANEMIA, IRON DEFICIENCY   . ANXIETY   . ARTHRITIS    s/p bilateral THR  . ASTHMA, EXTRINSIC   . Bronchitis   . Cataracts, both eyes   . Chronic constipation   . FIBROMYALGIA    fibromyalgia  . G E R D   . High cholesterol   . HYPERLIPIDEMIA   . HYPERTENSION   . Interstitial cystitis    . MIGRAINE HEADACHE   . Panic attack    per patient     Past Surgical History:  Procedure Laterality Date  . ABDOMINAL HYSTERECTOMY    . ANTERIOR HIP REVISION Right 09/24/2016   Procedure: RIGHT HIP ACETABULUM AND FEMORAL HEAD REVISION WITH BONE GRAFT;  Surgeon: Paralee Cancel, MD;  Location: [facility];  Service: Orthopedics;  Laterality: Right;  . APPENDECTOMY    . BACK SURGERY    . CATARACT EXTRACTION    . COLONOSCOPY    . DOPPLER ECHOCARDIOGRAPHY    . ESOPHAGOGASTRODUODENOSCOPY ENDOSCOPY    . JOINT REPLACEMENT    . LUMBAR DISC SURGERY    . NM MYOVIEW LTD     stress test  . Ovarian cyst removed    . ROTATOR CUFF REPAIR Left 5-6 years ago   Dr Onnie Graham; done at [facility] surgery center   . TOTAL HIP ARTHROPLASTY    . TOTAL SHOULDER ARTHROPLASTY       Medications:  Outpatient Encounter Medications as of 05/25/2020  Medication Sig  . acetaminophen (TYLENOL) 500 MG tablet Take by mouth as needed.  . ALPRAZolam (XANAX) 0.25 MG tablet Take one half tab twice a day as needed for anxiety  . amLODipine (NORVASC) 5 MG tablet TAKE 1 TABLET BY MOUTH ONCE DAILY  . busPIRone (BUSPAR)  15 MG tablet Take 0.5 tablets (7.5 mg total) by mouth 2 (two) times daily. Patient reports shes taking 1/2 tablet BID (Patient taking differently: Take 7.5 mg by mouth 2 (two) times daily. Patient reports shes taking 1/4 tablet BID)  . cetirizine (ZYRTEC) 10 MG tablet Take 10 mg by mouth daily.   . chlorthalidone (HYGROTON) 25 MG tablet TAKE 1/2 TABLET BY MOUTH DAILY  . Cholecalciferol (VITAMIN D3) 5000 units TABS 5,000 IU OTC vitamin D3 daily.  . Cyanocobalamin (VITAMIN B-12) 2500 MCG TABS Take 1,250 mcg by mouth daily.  . DULoxetine (CYMBALTA) 30 MG capsule Take 1 capsule (30 mg total) by mouth daily.  . ferrous sulfate (FERROUSUL) 325 (65 FE) MG tablet Take 1 tablet (325 mg total) by mouth 3 (three) times daily with meals. (Patient taking differently: Take 325 mg by mouth daily.)  . fluticasone (FLONASE) 50 MCG/ACT  nasal spray USE 2 SPRAYS INTO EACH NOSTRIL EVERY DAY  . losartan (COZAAR) 100 MG tablet TAKE 1 TABLET BY MOUTH DAILY  . methocarbamol (ROBAXIN) 500 MG tablet as needed.  . montelukast (SINGULAIR) 10 MG tablet TAKE 1 TABLET BY MOUTH ONCE DAILY AT BEDTIME  . omeprazole (PRILOSEC) 20 MG capsule TAKE 1 CAPSULE BY MOUTH DAILY  . SYMBICORT 80-4.5 MCG/ACT inhaler INHALE 2 PUFFS BY MOUTH INTO THE LUNGS TWICE A DAY  . traMADol (ULTRAM) 50 MG tablet Take by mouth as needed.  . [DISCONTINUED] albuterol (PROAIR HFA) 108 (90 Base) MCG/ACT inhaler Inhale 2 puffs into the lungs every 6 (six) hours as needed for wheezing or shortness of breath.  . [DISCONTINUED] docusate sodium (COLACE) 100 MG capsule Take 1 capsule (100 mg total) by mouth 2 (two) times daily as needed for mild constipation.  . [DISCONTINUED] escitalopram (LEXAPRO) 5 MG tablet Take 1 tablet (5 mg total) by mouth daily.   No facility-administered encounter medications on file as of 05/25/2020.    Allergies:  Allergies  Allergen Reactions  . Morphine   . Morphine Sulfate Other (See Comments)    Makes her hyper    Family History: Family History  Problem Relation Age of Onset  . Arthritis Mother   . Hypertension Mother   . Heart disease Father   . Arthritis Father   . Hypertension Father   . Depression Sister   . Hyperlipidemia Sister   . Hypertension Other   . Hyperlipidemia Other     Social History: Social History   Tobacco Use  . Smoking status: Former Smoker    Packs/day: 0.10    Years: 10.00    Pack years: 1.00    Types: Cigarettes    Quit date: 03/25/1968    Years since quitting: 52.2  . Smokeless tobacco: Never Used  . 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 Retired Government social research officer  . Vaping Use: Never used  Substance Use Topics  . Alcohol use: No    Alcohol/week: 0.0 standard drinks  . Drug use: No   Social History   Social History Narrative   Lives alone  in a 3 story home.  Has 2 children.   Retired Glass blower/designer.  Education: Scientist, water quality. Right handed     Vital Signs:  BP (!) 145/79   Pulse 81   Ht 5\' 3"  (1.6 m)   Wt 139 lb (63 kg)   SpO2 97%   BMI 24.62 kg/m    General Medical Exam:  *** General:  Well appearing, comfortable.   Eyes/ENT: see cranial nerve examination.  Neck:   No carotid bruits. Respiratory:  Clear to auscultation, good air entry bilaterally.   Cardiac:  Regular rate and rhythm, no murmur.   Extremities:  No deformities, edema, or skin discoloration.  Skin:  No rashes or lesions.  Neurological Exam: MENTAL STATUS including orientation to time, place, person, recent and remote memory, attention span and concentration, language, and fund of knowledge is ***normal.  Speech is not dysarthric.  CRANIAL NERVES: II:  No visual field defects.  Unremarkable fundi.   III-IV-VI: Pupils equal round and reactive to light.  Normal conjugate, extra-ocular eye movements in all directions of gaze.  No nystagmus.  No ptosis***.   V:  Normal facial sensation.    VII:  Normal facial symmetry and movements.   VIII:  Normal hearing and vestibular function.   IX-X:  Normal palatal movement.   XI:  Normal shoulder shrug and head rotation.   XII:  Normal tongue strength and range of motion, no deviation or fasciculation.  MOTOR:  No atrophy, fasciculations or abnormal movements.  No pronator drift.   Upper Extremity:  Right  Left  Deltoid  5/5   5/5   Biceps  5/5   5/5   Triceps  5/5   5/5   Infraspinatus 5/5  5/5  Medial pectoralis 5/5  5/5  Wrist extensors  5/5   5/5   Wrist flexors  5/5   5/5   Finger extensors  5/5   5/5   Finger flexors  5/5   5/5   Dorsal interossei  5/5   5/5   Abductor pollicis  5/5   5/5   Tone (Ashworth scale)  0  0   Lower Extremity:  Right  Left  Hip flexors  5/5   5/5   Hip extensors  5/5   5/5   Adductor 5/5  5/5  Abductor 5/5  5/5  Knee flexors  5/5   5/5   Knee extensors  5/5   5/5   Dorsiflexors  5/5   5/5    Plantarflexors  5/5   5/5   Toe extensors  5/5   5/5   Toe flexors  5/5   5/5   Tone (Ashworth scale)  0  0   MSRs:  Right        Left                  brachioradialis 2+  2+  biceps 2+  2+  triceps 2+  2+  patellar 2+  2+  ankle jerk 2+  2+  Hoffman no  no  plantar response down  down   SENSORY:  Normal and symmetric perception of light touch, pinprick, vibration, and proprioception.  Romberg's sign absent.   COORDINATION/GAIT: Normal finger-to- nose-finger and heel-to-shin.  Intact rapid alternating movements bilaterally.  Able to rise from a chair without using arms.  Gait narrow based and stable. Tandem and stressed gait intact.    IMPRESSION: ***  PLAN/RECOMMENDATIONS:  *** Formal neurocognitive testing  MRI brain wo contrast Follow-up with Dr.Deveshwar for [name]  Return to clinic in *** months.  Total time spent: ***   Thank you for allowing me to participate in patient's care.  If I can answer any additional questions, I would be pleased to do so.    Sincerely,    Donika K. Posey Pronto, DO

## 2020-05-25 NOTE — Progress Notes (Signed)
[facility] Clinic Note - Initial Visit   Date: 05/25/20  [name] MRN: [id number] DOB: [birth year]   Dear Dr. [name]:  Thank you for your kind referral of [name] for consultation of memory loss. Although her history is well known to you, please allow [name] to reiterate it for the purpose of our medical record. The patient was accompanied to the clinic by self.    History of Present Illness: [name] is a 87 y.o. right-handed female with migraine, hypertension, asthma, anxiety, hyperlipidemia, diabetes mellitus, MCI presenting for evaluation of worsening memory.   She was evaluated in 2017 for memory complaints described difficulty with word-finding and remembering names.  Neurocognitive testing showed mild cognitive impairment and anxiety.  She is here because these symptoms have become much worse.  She has become increasingly forgetful, such as with recipes or reading a book.  She has difficulty with navigating in unfamiliar areas and has got lost when driving.  She has to plan her trips to be sure she knows where she is going.  She is unable to remember names, she tries to manage her own ADLs and has assistance as needed.  Her daughter lives 2 miles from her. Her husband has since passed.   She continues to crochet and knit.  She manages her own finances, but has her daughter oversee. She has some errors managing her medications. She depends on making lists and uses her calender for appointments. She uses a walker when walking outside of the home and a cane at home. She is highly concerned about dementia because her father and sister had it.   Regarding her feet, she is bothered by the discoloration and pain.  She has [name] disease and is followed by Dr. Estanislado Pandy.  No numbness/tingling.   Out-side paper records, electronic medical record, and images have been reviewed where available and summarized as:  Lab Results  Component Value Date    HGBA1C 6.0 (H) 03/29/2019   Lab Results  Component Value Date   VITAMINB12 1,167 06/30/2019   Lab Results  Component Value Date   TSH 1.93 02/08/2020   Lab Results  Component Value Date   ESRSEDRATE 2 10/01/2017    Past Medical History:  Diagnosis Date  . ALLERGIC RHINITIS   . ANEMIA, IRON DEFICIENCY   . ANXIETY   . ARTHRITIS    s/p bilateral THR  . ASTHMA, EXTRINSIC   . Bronchitis   . Cataracts, both eyes   . Chronic constipation   . FIBROMYALGIA    fibromyalgia  . G E R D   . High cholesterol   . HYPERLIPIDEMIA   . HYPERTENSION   . Interstitial cystitis   . MIGRAINE HEADACHE   . Panic attack    per patient     Past Surgical History:  Procedure Laterality Date  . ABDOMINAL HYSTERECTOMY    . ANTERIOR HIP REVISION Right 09/24/2016   Procedure: RIGHT HIP ACETABULUM AND FEMORAL HEAD REVISION WITH BONE GRAFT;  Surgeon: Paralee Cancel, MD;  Location: [facility];  Service: Orthopedics;  Laterality: Right;  . APPENDECTOMY    . BACK SURGERY    . CATARACT EXTRACTION    . COLONOSCOPY    . DOPPLER ECHOCARDIOGRAPHY    . ESOPHAGOGASTRODUODENOSCOPY ENDOSCOPY    . JOINT REPLACEMENT    . LUMBAR DISC SURGERY    . NM MYOVIEW LTD     stress test  . Ovarian cyst removed    . ROTATOR CUFF  REPAIR Left 5-6 years ago   Dr Onnie Graham; done at [facility]   . TOTAL HIP ARTHROPLASTY    . TOTAL SHOULDER ARTHROPLASTY       Medications:  Outpatient Encounter Medications as of 05/25/2020  Medication Sig  . acetaminophen (TYLENOL) 500 MG tablet Take by mouth as needed.  . ALPRAZolam (XANAX) 0.25 MG tablet Take one half tab twice a day as needed for anxiety  . amLODipine (NORVASC) 5 MG tablet TAKE 1 TABLET BY MOUTH ONCE DAILY  . busPIRone (BUSPAR) 15 MG tablet Take 0.5 tablets (7.5 mg total) by mouth 2 (two) times daily. Patient reports shes taking 1/2 tablet BID (Patient taking differently: Take 7.5 mg by mouth 2 (two) times daily. Patient reports shes taking 1/4 tablet BID)  .  cetirizine (ZYRTEC) 10 MG tablet Take 10 mg by mouth daily.   . chlorthalidone (HYGROTON) 25 MG tablet TAKE 1/2 TABLET BY MOUTH DAILY  . Cholecalciferol (VITAMIN D3) 5000 units TABS 5,000 IU OTC vitamin D3 daily.  . Cyanocobalamin (VITAMIN B-12) 2500 MCG TABS Take 1,250 mcg by mouth daily.  . DULoxetine (CYMBALTA) 30 MG capsule Take 1 capsule (30 mg total) by mouth daily.  . ferrous sulfate (FERROUSUL) 325 (65 FE) MG tablet Take 1 tablet (325 mg total) by mouth 3 (three) times daily with meals. (Patient taking differently: Take 325 mg by mouth daily.)  . fluticasone (FLONASE) 50 MCG/ACT nasal spray USE 2 SPRAYS INTO EACH NOSTRIL EVERY DAY  . losartan (COZAAR) 100 MG tablet TAKE 1 TABLET BY MOUTH DAILY  . methocarbamol (ROBAXIN) 500 MG tablet as needed.  . montelukast (SINGULAIR) 10 MG tablet TAKE 1 TABLET BY MOUTH ONCE DAILY AT BEDTIME  . omeprazole (PRILOSEC) 20 MG capsule TAKE 1 CAPSULE BY MOUTH DAILY  . SYMBICORT 80-4.5 MCG/ACT inhaler INHALE 2 PUFFS BY MOUTH INTO THE LUNGS TWICE A DAY  . traMADol (ULTRAM) 50 MG tablet Take by mouth as needed.  . [DISCONTINUED] albuterol (PROAIR HFA) 108 (90 Base) MCG/ACT inhaler Inhale 2 puffs into the lungs every 6 (six) hours as needed for wheezing or shortness of breath.  . [DISCONTINUED] docusate sodium (COLACE) 100 MG capsule Take 1 capsule (100 mg total) by mouth 2 (two) times daily as needed for mild constipation.  . [DISCONTINUED] escitalopram (LEXAPRO) 5 MG tablet Take 1 tablet (5 mg total) by mouth daily.   No facility-administered encounter medications on file as of 05/25/2020.    Allergies:  Allergies  Allergen Reactions  . Morphine   . Morphine Sulfate Other (See Comments)    Makes her hyper    Family History: Family History  Problem Relation Age of Onset  . Arthritis Mother   . Hypertension Mother   . Heart disease Father   . Arthritis Father   . Hypertension Father   . Depression Sister   . Hyperlipidemia Sister   . Hypertension  Other   . Hyperlipidemia Other     Social History: Social History   Tobacco Use  . Smoking status: Former Smoker    Packs/day: 0.10    Years: 10.00    Pack years: 1.00    Types: Cigarettes    Quit date: 03/25/1968    Years since quitting: 52.2  . Smokeless tobacco: Never Used  . 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 Retired Government social research officer  . Vaping Use: Never used  Substance Use Topics  . Alcohol use: No    Alcohol/week: 0.0 standard drinks  . Drug use:  No   Social History   Social History Narrative   Lives alone  in a 3 story home.  Has 2 children.  Retired Glass blower/designer.  Education: Scientist, water quality. Right handed     Vital Signs:  BP (!) 145/79   Pulse 81   Ht 5\' 3"  (1.6 m)   Wt 139 lb (63 kg)   SpO2 97%   BMI 24.62 kg/m    Neurological Exam: MENTAL STATUS including orientation to time, place, person, recent and remote memory, attention span and concentration, language, and fund of knowledge is normal.  Speech is not dysarthric. Montreal Cognitive Assessment  05/25/2020 08/25/2015  Visuospatial/ Executive (0/5) 2 4  Naming (0/3) 2 3  Attention: Read list of digits (0/2) 2 2  Attention: Read list of letters (0/1) 1 1  Attention: Serial 7 subtraction starting at 100 (0/3) 3 3  Language: Repeat phrase (0/2) 2 2  Language : Fluency (0/1) 1 1  Abstraction (0/2) 1 2  Delayed Recall (0/5) 0 2  Orientation (0/6) 6 6  Total 20 26  Adjusted Score (based on education) 20 26    CRANIAL NERVES: II:  No visual field defects.  III-IV-VI: Pupils equal round and reactive to light.  Normal conjugate, extra-ocular eye movements in all directions of gaze.  No nystagmus.  No ptosis.   V:  Normal facial sensation.    VII:  Normal facial symmetry and movements.   VIII:  Normal hearing and vestibular function.   IX-X:  Normal palatal movement.   XI:  Normal shoulder shrug and head rotation.   XII:  Normal tongue strength and range of motion, no deviation or  fasciculation.  MOTOR:  Motor strength is 5/5 throughout.  No atrophy, fasciculations or abnormal movements.  No pronator drift. Purple discoloration of the toes bilaterally.  MSRs:  Right        Left                  brachioradialis 2+  2+  biceps 2+  2+  triceps 2+  2+  patellar 2+  2+  ankle jerk 2+  2+  Hoffman no  no  plantar response down  down   SENSORY:  Normal and symmetric perception of light touch, pinprick, vibration, and proprioception.  Romberg's sign absent.   COORDINATION/GAIT: Normal finger-to- nose-finger and heel-to-shin.  Gait narrow based and stable, assisted with cane.  IMPRESSION: Progressive cognitive impairment, with transition into major neurocognitive dysfunction  PLAN/RECOMMENDATIONS:  Formal neurocognitive testing  MRI brain wo contrast Based on the above results, plan to start donepezil Follow-up with Dr.Deveshwar for [name]   Further recommendations pending results.   Thank you for allowing me to participate in patient's care.  If I can answer any additional questions, I would be pleased to do so.    Sincerely,    Ceria Suminski K. Posey Pronto, DO

## 2020-06-01 DIAGNOSIS — M50322 Other cervical disc degeneration at C5-C6 level: Secondary | ICD-10-CM | POA: Diagnosis not present

## 2020-06-01 DIAGNOSIS — M9903 Segmental and somatic dysfunction of lumbar region: Secondary | ICD-10-CM | POA: Diagnosis not present

## 2020-06-01 DIAGNOSIS — M5136 Other intervertebral disc degeneration, lumbar region: Secondary | ICD-10-CM | POA: Diagnosis not present

## 2020-06-01 DIAGNOSIS — M9901 Segmental and somatic dysfunction of cervical region: Secondary | ICD-10-CM | POA: Diagnosis not present

## 2020-06-02 ENCOUNTER — Ambulatory Visit: Payer: Medicare Other | Admitting: Neurology

## 2020-06-05 DIAGNOSIS — M9903 Segmental and somatic dysfunction of lumbar region: Secondary | ICD-10-CM | POA: Diagnosis not present

## 2020-06-05 DIAGNOSIS — M50322 Other cervical disc degeneration at C5-C6 level: Secondary | ICD-10-CM | POA: Diagnosis not present

## 2020-06-05 DIAGNOSIS — M5136 Other intervertebral disc degeneration, lumbar region: Secondary | ICD-10-CM | POA: Diagnosis not present

## 2020-06-05 DIAGNOSIS — M9901 Segmental and somatic dysfunction of cervical region: Secondary | ICD-10-CM | POA: Diagnosis not present

## 2020-06-06 DIAGNOSIS — H40013 Open angle with borderline findings, low risk, bilateral: Secondary | ICD-10-CM | POA: Diagnosis not present

## 2020-06-06 DIAGNOSIS — H35372 Puckering of macula, left eye: Secondary | ICD-10-CM | POA: Diagnosis not present

## 2020-06-06 DIAGNOSIS — Z961 Presence of intraocular lens: Secondary | ICD-10-CM | POA: Diagnosis not present

## 2020-06-06 DIAGNOSIS — H04123 Dry eye syndrome of bilateral lacrimal glands: Secondary | ICD-10-CM | POA: Diagnosis not present

## 2020-06-13 DIAGNOSIS — M47816 Spondylosis without myelopathy or radiculopathy, lumbar region: Secondary | ICD-10-CM | POA: Diagnosis not present

## 2020-06-15 DIAGNOSIS — M5134 Other intervertebral disc degeneration, thoracic region: Secondary | ICD-10-CM | POA: Diagnosis not present

## 2020-06-15 DIAGNOSIS — M9902 Segmental and somatic dysfunction of thoracic region: Secondary | ICD-10-CM | POA: Diagnosis not present

## 2020-06-15 DIAGNOSIS — M9903 Segmental and somatic dysfunction of lumbar region: Secondary | ICD-10-CM | POA: Diagnosis not present

## 2020-06-15 DIAGNOSIS — M5136 Other intervertebral disc degeneration, lumbar region: Secondary | ICD-10-CM | POA: Diagnosis not present

## 2020-06-16 ENCOUNTER — Other Ambulatory Visit: Payer: Self-pay | Admitting: Pulmonary Disease

## 2020-06-21 ENCOUNTER — Ambulatory Visit
Admission: RE | Admit: 2020-06-21 | Discharge: 2020-06-21 | Disposition: A | Discharge status: Home / Self Care | Payer: Medicare Other | Source: Ambulatory Visit | Attending: Neurology | Admitting: Neurology

## 2020-06-21 ENCOUNTER — Other Ambulatory Visit: Payer: Self-pay

## 2020-06-21 DIAGNOSIS — R413 Other amnesia: Secondary | ICD-10-CM | POA: Diagnosis not present

## 2020-06-21 DIAGNOSIS — I739 Peripheral vascular disease, unspecified: Secondary | ICD-10-CM | POA: Diagnosis not present

## 2020-06-21 DIAGNOSIS — I6782 Cerebral ischemia: Secondary | ICD-10-CM | POA: Diagnosis not present

## 2020-06-21 DIAGNOSIS — G319 Degenerative disease of nervous system, unspecified: Secondary | ICD-10-CM

## 2020-06-22 ENCOUNTER — Telehealth: Payer: Self-pay

## 2020-06-22 DIAGNOSIS — M5136 Other intervertebral disc degeneration, lumbar region: Secondary | ICD-10-CM | POA: Diagnosis not present

## 2020-06-22 DIAGNOSIS — M5134 Other intervertebral disc degeneration, thoracic region: Secondary | ICD-10-CM | POA: Diagnosis not present

## 2020-06-22 DIAGNOSIS — M9903 Segmental and somatic dysfunction of lumbar region: Secondary | ICD-10-CM | POA: Diagnosis not present

## 2020-06-22 DIAGNOSIS — M9902 Segmental and somatic dysfunction of thoracic region: Secondary | ICD-10-CM | POA: Diagnosis not present

## 2020-06-22 NOTE — Telephone Encounter (Signed)
Pt called no answer no voice mail picked up will try again later

## 2020-06-22 NOTE — Telephone Encounter (Signed)
-----   Message from Sonya Berthold, DO sent at 06/22/2020 12:56 PM EDT ----- Please let pt know that her MRI shows age-related changes. Thanks.

## 2020-06-23 ENCOUNTER — Telehealth: Payer: Self-pay | Admitting: Pulmonary Disease

## 2020-06-23 NOTE — Telephone Encounter (Signed)
Called and spoke with patient, she states she has enough Xanax to get her to Monday or Tuesday of next week.  I advised her that Dr. Silas Flood is currently on vacation and will be back in the office on Monday.  I let her know I would get the message to him that she needs a refill of her Xanax and once he responds to our message one of [name] will give her a call back.  She verbalized understanding.  Dr. Silas Flood, Patient states she needs a refill of her Xanax, she will be our by Monday or Tuesday.  She uses [facility].  I advised her you were out of the office and would return on Monday.  Thank you.

## 2020-06-26 MED ORDER — ALPRAZOLAM 0.25 MG PO TABS
ORAL_TABLET | ORAL | 0 refills | Status: DC
Start: 2020-06-26 — End: 2020-08-14

## 2020-06-26 NOTE — Telephone Encounter (Signed)
Attempted to call pt but unable to reach. Per DPR, it is okay to leave detailed message on pt's home number so message was left letting her know that her Rx had been refilled. Nothing further needed.

## 2020-06-27 ENCOUNTER — Telehealth: Payer: Self-pay

## 2020-06-27 NOTE — Telephone Encounter (Signed)
-----   Message from Alda Berthold, DO sent at 06/22/2020 12:56 PM EDT ----- Please let pt know that her MRI shows age-related changes. Thanks.

## 2020-06-27 NOTE — Telephone Encounter (Signed)
Pt called and informed that MRI shows age-related changes

## 2020-06-29 DIAGNOSIS — M9903 Segmental and somatic dysfunction of lumbar region: Secondary | ICD-10-CM | POA: Diagnosis not present

## 2020-06-29 DIAGNOSIS — M5136 Other intervertebral disc degeneration, lumbar region: Secondary | ICD-10-CM | POA: Diagnosis not present

## 2020-06-29 DIAGNOSIS — M5134 Other intervertebral disc degeneration, thoracic region: Secondary | ICD-10-CM | POA: Diagnosis not present

## 2020-06-29 DIAGNOSIS — M9902 Segmental and somatic dysfunction of thoracic region: Secondary | ICD-10-CM | POA: Diagnosis not present

## 2020-07-05 DIAGNOSIS — M9902 Segmental and somatic dysfunction of thoracic region: Secondary | ICD-10-CM | POA: Diagnosis not present

## 2020-07-05 DIAGNOSIS — M5134 Other intervertebral disc degeneration, thoracic region: Secondary | ICD-10-CM | POA: Diagnosis not present

## 2020-07-05 DIAGNOSIS — M9903 Segmental and somatic dysfunction of lumbar region: Secondary | ICD-10-CM | POA: Diagnosis not present

## 2020-07-05 DIAGNOSIS — M5136 Other intervertebral disc degeneration, lumbar region: Secondary | ICD-10-CM | POA: Diagnosis not present

## 2020-07-11 ENCOUNTER — Encounter: Payer: Medicare Other | Admitting: Neurology

## 2020-07-13 ENCOUNTER — Other Ambulatory Visit: Payer: Self-pay | Admitting: Pulmonary Disease

## 2020-07-17 DIAGNOSIS — M47816 Spondylosis without myelopathy or radiculopathy, lumbar region: Secondary | ICD-10-CM | POA: Diagnosis not present

## 2020-07-20 DIAGNOSIS — M5136 Other intervertebral disc degeneration, lumbar region: Secondary | ICD-10-CM | POA: Diagnosis not present

## 2020-07-20 DIAGNOSIS — M5134 Other intervertebral disc degeneration, thoracic region: Secondary | ICD-10-CM | POA: Diagnosis not present

## 2020-07-20 DIAGNOSIS — M9902 Segmental and somatic dysfunction of thoracic region: Secondary | ICD-10-CM | POA: Diagnosis not present

## 2020-07-20 DIAGNOSIS — M9903 Segmental and somatic dysfunction of lumbar region: Secondary | ICD-10-CM | POA: Diagnosis not present

## 2020-07-27 DIAGNOSIS — M5134 Other intervertebral disc degeneration, thoracic region: Secondary | ICD-10-CM | POA: Diagnosis not present

## 2020-07-27 DIAGNOSIS — M5136 Other intervertebral disc degeneration, lumbar region: Secondary | ICD-10-CM | POA: Diagnosis not present

## 2020-07-27 DIAGNOSIS — M9903 Segmental and somatic dysfunction of lumbar region: Secondary | ICD-10-CM | POA: Diagnosis not present

## 2020-07-27 DIAGNOSIS — M9902 Segmental and somatic dysfunction of thoracic region: Secondary | ICD-10-CM | POA: Diagnosis not present

## 2020-08-02 DIAGNOSIS — M5136 Other intervertebral disc degeneration, lumbar region: Secondary | ICD-10-CM | POA: Diagnosis not present

## 2020-08-02 DIAGNOSIS — M9902 Segmental and somatic dysfunction of thoracic region: Secondary | ICD-10-CM | POA: Diagnosis not present

## 2020-08-02 DIAGNOSIS — M5134 Other intervertebral disc degeneration, thoracic region: Secondary | ICD-10-CM | POA: Diagnosis not present

## 2020-08-02 DIAGNOSIS — M9903 Segmental and somatic dysfunction of lumbar region: Secondary | ICD-10-CM | POA: Diagnosis not present

## 2020-08-03 ENCOUNTER — Other Ambulatory Visit: Payer: Self-pay

## 2020-08-03 ENCOUNTER — Ambulatory Visit (INDEPENDENT_AMBULATORY_CARE_PROVIDER_SITE_OTHER): Payer: Medicare Other | Admitting: Psychology

## 2020-08-03 ENCOUNTER — Encounter: Payer: Self-pay | Admitting: Psychology

## 2020-08-03 ENCOUNTER — Ambulatory Visit: Payer: Medicare Other | Admitting: Psychology

## 2020-08-03 DIAGNOSIS — G3184 Mild cognitive impairment, so stated: Secondary | ICD-10-CM | POA: Diagnosis not present

## 2020-08-03 DIAGNOSIS — I700 Atherosclerosis of aorta: Secondary | ICD-10-CM | POA: Insufficient documentation

## 2020-08-03 DIAGNOSIS — F4323 Adjustment disorder with mixed anxiety and depressed mood: Secondary | ICD-10-CM

## 2020-08-03 DIAGNOSIS — R4189 Other symptoms and signs involving cognitive functions and awareness: Secondary | ICD-10-CM

## 2020-08-03 DIAGNOSIS — G894 Chronic pain syndrome: Secondary | ICD-10-CM | POA: Diagnosis not present

## 2020-08-03 HISTORY — DX: Mild cognitive impairment of uncertain or unknown etiology: G31.84

## 2020-08-03 NOTE — Progress Notes (Signed)
   Psychometrician Note   Cognitive testing was administered to [employer] by Milana Kidney, B.S. (psychometrist) under the supervision of Dr. Christia Reading, Ph.D., licensed psychologist on 08/03/20. Ms. Wyman did not appear overtly distressed by the testing session per behavioral observation or responses across self-report questionnaires. Rest breaks were offered.    The battery of tests administered was selected by Dr. Christia Reading, Ph.D. with consideration to Ms. Morocco's current level of functioning, the nature of her symptoms, emotional and behavioral responses during interview, level of literacy, observed level of motivation/effort, and the nature of the referral question. This battery was communicated to the psychometrist. Communication between Dr. Christia Reading, Ph.D. and the psychometrist was ongoing throughout the evaluation and Dr. Christia Reading, Ph.D. was immediately accessible at all times. Dr. Christia Reading, Ph.D. provided supervision to the psychometrist on the date of this service to the extent necessary to assure the quality of all services provided.    [name] will return within approximately 1-2 weeks for an interactive feedback session with Dr. Melvyn Novas at which time her test performances, clinical impressions, and treatment recommendations will be reviewed in detail. Ms. Eppard understands she can contact our office should she require our assistance before this time.  A total of 120 minutes of billable time were spent face-to-face with Ms. Satchell by the psychometrist. This includes both test administration and scoring time. Billing for these services is reflected in the clinical report generated by Dr. Christia Reading, Ph.D.  This note reflects time spent with the psychometrician and does not include test scores or any clinical interpretations made by Dr. Melvyn Novas. The full report will follow in a separate note.

## 2020-08-03 NOTE — Progress Notes (Signed)
NEUROPSYCHOLOGICAL EVALUATION [place] Department of Neurology  Date of Evaluation: Aug 03, 2020  Reason for Referral:   [name] is a 87 y.o. right-handed Caucasian female referred by [name], D.O., to characterize her current cognitive functioning and assist with diagnostic clarity and treatment planning in the context of subjective cognitive decline.   Assessment and Plan:   Clinical Impression(s): [name] pattern of performance is suggestive of variability across memory measures, with notable impairment learning and later recalling list-based information. Recall was also impaired on a figure drawing task, while performance was appropriate across all aspects of a story-based memory task. Additional relative weaknesses were exhibited across response inhibition and semantic fluency. Performance was appropriate across processing speed, attention/concentration, cognitive flexibility, abstract reasoning, receptive language, phonemic fluency, confrontation naming, and visuospatial abilities. [name] denied difficulties completing instrumental activities of daily living (ADLs) independently and her daughter did not contradict this. As such, given evidence for cognitive dysfunction described above, she meets criteria for a Mild Neurocognitive Disorder ("mild cognitive impairment") at the present time.  Relative to her previous evaluation, notable performance declines were seen across encoding (i.e., learning) and retrieval aspects of list-based information, retrieval of a previously learned complex figure, and performance across a visuomotor task assessing cognitive flexibility; however, the latter remained in the average normative range. A more subtle decline was exhibited across confrontation naming; however, this task also remained in the average normative range. All other performances were stable, including strong scores across a story-based  verbal memory task.   The etiology for cognitive decline, especially that affecting certain memory performances, is unclear at the present time. Memory dysfunction, coupled with a relative weakness across semantic fluency, variability across executive functioning, and subtle decline across confrontation naming could be concerning for the very early beginning of a neurodegenerative process such as Alzheimer's disease. However, it is important to highlight that performance across a story-based memory task was stable and strong across the current evaluation. Additionally, despite weaknesses on other memory tasks, she still performed reasonably well across recognition tasks, which does not suggest evidence for an information storage deficit at the present time. Overall, this is encouraging despite evidence for cognitive decline at the present time. She reported acute symptoms of mild anxiety and depression. That, coupled with chronic pain symptoms and vitamin deficiencies, could impact cognitive performances. However, declines across memory tasks are more than what would be expected from these symptoms alone. 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 However, this would not fully explain memory decline either. Continued medical monitoring will be important moving forward.  Recommendations: A repeat neuropsychological evaluation in 18-24 months (or sooner if functional decline is noted) is recommended to assess the trajectory of future cognitive decline should it occur. This will also aid in future efforts towards improved diagnostic clarity.  If desired, [name] could discuss medication options for memory dysfunction with Dr. Posey Pronto. It is important to highlight that these medications have been shown to slow functional decline in some individuals. No  current treatment is able to stop or reverse cognitive decline in the presence of a neurodegenerative condition.   Should she feel it necessary, [name] would be encouraged to speak with her prescribing physician regarding medication adjustments to optimally manage mild psychiatric distress. It should be noted that Xanax has well known cognitive side effects. While Ms.  Barr does appear to be on a very low dose, this could theoretically impact cognitive functioning to a mild extent.   [name] is encouraged to attend to lifestyle factors for brain health (e.g., regular physical exercise, good nutrition habits, regular participation in cognitively-stimulating activities, and general stress management techniques), which are likely to have benefits for both emotional adjustment and cognition. In fact, in addition to promoting good general health, regular exercise incorporating aerobic activities (e.g., brisk walking, jogging, cycling, etc.) has been demonstrated to be a very effective treatment for depression and stress, with similar efficacy rates to both antidepressant medication and psychotherapy. Optimal control of vascular risk factors (including safe cardiovascular exercise and adherence to dietary recommendations) is encouraged.  It will be important for [name] to have another person with her when in situations where she may need to process information, weigh the pros and cons of different options, and make decisions, in order to ensure that she fully understands and recalls all information to be considered.  When learning new information, she would benefit from information being broken up into small, manageable pieces. She may also find it helpful to articulate the material in her own words and in a context to promote encoding at the onset of a new task. This material may need to be repeated multiple times to promote encoding.  Because she shows better recall for structured information,  she will likely understand and retain new information better if it is presented to her in a meaningful or well-organized manner at the outset, such as grouping items into meaningful categories or presenting information in an outlined, bulleted, or story format.   To address problems with executive dysfunction, she may wish to consider:   -Avoiding external distractions when needing to concentrate   -Limiting exposure to fast paced environments with multiple sensory demands   -Writing down complicated information and using checklists   -Attempting and completing one task at a time (i.e., no multi-tasking)   -Verbalizing aloud each step of a task to maintain focus   -Reducing the amount of information considered at one time  Review of Records:   Ms. Conner was seen by [facility] [name], D.O.) on 08/25/2015 for an evaluation of memory changes. At that time, Ms. Terhaar generally denied memory problems outside of occasional word-finding difficulty and remembering names. She also endorsed difficulty with concentration, especially while reading. Memory-based concerns are fueled by her father and sister developing degenerative neurological conditions. ADLs were described as intact. She did report significant ongoing stress as her husband had been diagnosed with advanced Lewy Body Dementia. She also endorsed significant anxiety and takes relevant medications as necessary.   She completed a comprehensive neuropsychological evaluation Kandis Nab, Psy.D.) on 04/02/2016. Results of cognitive testingwere entirely within normal limits. There were no areas of observed impairment and all performances were at least average, with many scores in the high average to very superior range. There was said to be evidence of mild generalized anxiety which appears to be longstanding in nature and improved recently with medication. There were no concerns surrounding dementia or underlying Alzheimer's  disease at that time. Overall, cognitive complaints were said to be most likely secondary to psychosocial stressors and normal aging at that time.   Ms. Phenis was most recently seen by Dr. Posey Pronto on 05/25/2020. At that time, Ms. Bendorf reported worsening memory concerns. Specifically, she reported having become increasingly forgetful, such as with recipes or while reading a book. She also reported difficulty navigating  in unfamiliar areas and has got lost when driving. ADLs were generally described as intact. Performance on a brief cognitive screening instrument (MOCA) was 20/30, down from 26/30 when seen in 2017. Ultimately, Ms. Cuneo was referred for a repeat neuropsychological evaluation to characterize her cognitive abilities and to assist with diagnostic clarity and treatment planning.   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   Past Medical History:  Diagnosis Date  .  Muscle spasms of back due to scoliosis 05/27/2018  . Adjustment disorder with mixed anxiety and depressed mood 01/11/2016  . Allergic rhinitis 08/12/2007  . Asthma, mild persistent 12/19/2008   Mild persistent asthma with atopy March 2017 eosinophil count 100 cells per microliter, IgE 26 Jul 2015 pulmonary function testing ratio 86%, FEV1 2.10 L (113% predicted), FVC 2.45 (98% predicted), no change in FEV1 with bronchodilator, total lung capacity 4.57 L (93% predicted). DLCO 17.47 (76% predicted).    . Bronchitis   . Chronic constipation   . Chronic fatigue syndrome with fibromyalgia 01/20/2016  . Chronic kidney disease due to hypertension 08/27/2019  . Chronic pain 05/27/2018  . DDD (degenerative disc disease), lumbar 10/21/2017   Status post surgery x2  . Essential hypertension 08/12/2007  . Gastro-esophageal reflux disease without esophagitis 08/12/2007  . Generalized anxiety disorder 08/12/2007  . Hardening of the aorta  (main artery of the heart)   . Herpes simplex labialis 04/03/2012  . History of cataracts    bilateral  . History of COPD 10/01/2017  . History of migraine headaches   . History of urinary tract infection 05/30/2010  . Hyperlipidemia 08/12/2007  . Insomnia 04/23/2017  . Interstitial cystitis   . Iron deficiency anemia 11/03/2009  . Left bundle branch block 11/13/2010  . Lumbar radiculopathy, chronic   . Lumbar spondylosis 02/24/2020  . Metatarsalgia of left foot 04/23/2019  . Osteoarthritis 08/12/2007   Qualifier: Diagnosis of  By: Jim Like    . Pain in joint of right shoulder 06/25/2017  . Prediabetes 01/11/2016  . Primary [name] disease without gangrene 06/02/2017  . Shingles 04/12/2011  . Trochanteric bursitis of right hip 06/25/2017  . Ulcer of toe 04/23/2019  . Vitamin B deficiency 08/27/2019  . Vitamin B12 deficiency 08/30/2015   Please inform patient that she has vitamin B12 deficiency. Start Vitamin B12 1059mcg IM injection daily x 7 days, weekly x 4 weeks, then monthly thereafter x 1 year.     . Vitamin D deficiency 01/19/2016    Past Surgical History:  Procedure Laterality Date  . ABDOMINAL HYSTERECTOMY    . ANTERIOR HIP REVISION Right 09/24/2016   Procedure: RIGHT HIP ACETABULUM AND FEMORAL HEAD REVISION WITH BONE GRAFT;  Surgeon: Paralee Cancel, MD;  Location: [facility];  Service: Orthopedics;  Laterality: Right;  . APPENDECTOMY    . BACK SURGERY    . CATARACT EXTRACTION    . COLONOSCOPY    . DOPPLER ECHOCARDIOGRAPHY    . ESOPHAGOGASTRODUODENOSCOPY ENDOSCOPY    . JOINT REPLACEMENT    . LUMBAR DISC SURGERY    . NM MYOVIEW LTD     stress test  . Ovarian cyst removed    . ROTATOR CUFF REPAIR Left 5-6 years ago   Dr Onnie Graham; done at [facility] center   . TOTAL HIP ARTHROPLASTY    . TOTAL SHOULDER ARTHROPLASTY      Current Outpatient Medications:  .  acetaminophen (TYLENOL) 500 MG tablet, Take by mouth  as needed., Disp: , Rfl:  .  ALPRAZolam (XANAX) 0.25 MG tablet,  Take one half tab twice a day as needed for anxiety, Disp: 60 tablet, Rfl: 0 .  amLODipine (NORVASC) 5 MG tablet, TAKE 1 TABLET BY MOUTH ONCE DAILY, Disp: 90 tablet, Rfl: 1 .  busPIRone (BUSPAR) 15 MG tablet, Take 0.5 tablets (7.5 mg total) by mouth 2 (two) times daily. Patient reports shes taking 1/2 tablet BID (Patient taking differently: Take 7.5 mg by mouth 2 (two) times daily. Patient reports shes taking 1/4 tablet BID), Disp: 90 tablet, Rfl: 1 .  cetirizine (ZYRTEC) 10 MG tablet, Take 10 mg by mouth daily. , Disp: , Rfl:  .  chlorthalidone (HYGROTON) 25 MG tablet, TAKE 1/2 TABLET BY MOUTH DAILY, Disp: 45 tablet, Rfl: 9 .  Cholecalciferol (VITAMIN D3) 5000 units TABS, 5,000 IU OTC vitamin D3 daily., Disp: 90 tablet, Rfl: 3 .  Cyanocobalamin (VITAMIN B-12) 2500 MCG TABS, Take 1,250 mcg by mouth daily., Disp: , Rfl:  .  DULoxetine (CYMBALTA) 30 MG capsule, Take 1 capsule (30 mg total) by mouth daily., Disp: 90 capsule, Rfl: 0 .  ferrous sulfate (FERROUSUL) 325 (65 FE) MG tablet, Take 1 tablet (325 mg total) by mouth 3 (three) times daily with meals. (Patient taking differently: Take 325 mg by mouth daily.), Disp: , Rfl:  .  fluticasone (FLONASE) 50 MCG/ACT nasal spray, USE 2 SPRAYS INTO EACH NOSTRIL EVERY DAY, Disp: 48 g, Rfl: 3 .  losartan (COZAAR) 100 MG tablet, TAKE 1 TABLET BY MOUTH DAILY, Disp: 90 tablet, Rfl: 1 .  methocarbamol (ROBAXIN) 500 MG tablet, as needed., Disp: , Rfl:  .  montelukast (SINGULAIR) 10 MG tablet, TAKE 1 TABLET BY MOUTH ONCE DAILY AT BEDTIME, Disp: 90 tablet, Rfl: 1 .  omeprazole (PRILOSEC) 20 MG capsule, TAKE 1 CAPSULE BY MOUTH DAILY, Disp: 90 capsule, Rfl: 1 .  SYMBICORT 80-4.5 MCG/ACT inhaler, INHALE 2 PUFFS BY MOUTH INTO THE LUNGS TWICE A DAY, Disp: 10.2 g, Rfl: 5 .  traMADol (ULTRAM) 50 MG tablet, Take by mouth as needed., Disp: , Rfl:   Clinical Interview:   The following information was obtained during a clinical interview with Ms. Greenlief and her daughter  prior to cognitive testing.  Cognitive Symptoms: Decreased short-term memory: Endorsed. She reported being more forgetful surrounding recollection of dates which she feels are important. She also reported trouble recalling names, as well as misplacing things around her residence. Generally speaking, if she is paying attention, she described mild memory dysfunction. Her daughter added her perception that memory lapses more frequently occur during periods of higher back pain. Ms. Sether was unsure if memory had declined over time or if she was just paying more attention to it relative to previous habits.  Decreased long-term memory: Denied. Decreased attention/concentration: Generally denied. She noted that her "mind will wander" from time to time but that she is generally able to pay attention well when desired. She denied increased distractibility.  Reduced processing speed: Denied. Difficulties with executive functions: Denied. She is generally able to make decisions but does seek out a second opinion when she feels it necessary. Trouble with impulsivity or any recent personality changes were denied.  Difficulties with emotion regulation: Denied. Difficulties with receptive language: Denied. Difficulties with word finding: Endorsed "sometimes."  Decreased visuoperceptual ability: Denied.  Difficulties completing ADLs: Denied. She manages her medications and personal finances independently without issue. Her daughter was in agreement with this. They also denied any current driving-related concerns.  Additional Medical History: History of traumatic brain injury/concussion: Denied. History of stroke: Denied. History of seizure activity: Denied. History of known exposure to toxins: Denied. Symptoms of chronic pain: Endorsed. She reported longstanding, chronic back pain attributed to scoliosis. Her and her daughter also stated that pain symptoms can be distracting and influence cognitive abilities.   Experience of frequent headaches/migraines: Denied. She did acknowledge a remote history of migraine headaches but that symptoms subsided naturally over time.  Frequent instances of dizziness/vertigo: Denied.  Sensory changes: She wears glasses with positive effect. She also reported a diminished sense of taste, which she attributed to medication side effects. Her daughter noted a more pronounced diminished sense of smell and that she has noticed that her mother will favor foods with greater spice content due to normal foods tasting bland. Hearing-related difficulties were denied.  Balance/coordination difficulties: Endorsed. Balance difficulties were attributed to scoliosis and that her left leg is shorter than her right. She denied recent falls but did report being very careful and ambulating slowly due to fears of falling.  Other motor difficulties: Denied.  Sleep History: Estimated hours obtained each night: 4-5 hours.  Difficulties falling asleep: Denied. Difficulties staying asleep: Endorsed. She reported waking up to use the restroom and will occasionally have trouble falling back asleep. She also described less frequent instances where she will wake early in the morning without any known reason and have trouble going back to sleep.  Feels rested and refreshed upon awakening: Denied.  History of snoring: Denied. History of waking up gasping for air: Denied. Witnessed breath cessation while asleep: Denied.  History of vivid dreaming: Denied. Excessive movement while asleep: Denied. Instances of acting out her dreams: Denied.  Psychiatric/Behavioral Health History: Depression: Acute mood symptoms were said to generally be pain dependant. Ms. Marolf stated that "when the pain is good, I'm good but when it disrupts what I need to do, I get frustrated." She denied to her knowledge ever being formally diagnosed with major depressive disorder. Records do suggest a prior adjustment disorder  with anxious distress and depressed mood around the passing of her husband. Current or remote suicidal ideation, intent, or plan was denied.  Anxiety: She acknowledged a longstanding history of generalized anxiety and takes 0.25mg  dose of Xanax in the morning and evening with positive effect. Her daughter noted that if these doses are missed, her mother will experience chest pains and other physical manifestations of anxiety.  Mania: Denied. Trauma History: Denied. Visual/auditory hallucinations: Denied. Delusional thoughts: Denied.  Tobacco: Denied. Alcohol: She denied current alcohol consumption and denied a history of problematic alcohol abuse or dependence.  Recreational drugs: Denied. Caffeine: Denied.   Family History: Problem Relation Age of Onset  . Arthritis Mother   . Hypertension Mother   . Heart disease Father   . Arthritis Father   . Hypertension Father   . Memory loss Father        likely Alzheimer's disease; symptom onset in 77s  . Depression Sister   . Hyperlipidemia Sister   . Hypertension Other   . Hyperlipidemia Other   . Parkinson's disease Sister   . Memory loss Sister    This information was confirmed by Ms. Baudoin.  Academic/Vocational History: Highest level of educational attainment: 18 years. She earned a Dietitian from [employer] in Education officer, museum, Cabin crew, and physics. She then earned a Master's degree in Personnel officer. She described herself as a good (A/B) student in academic settings. 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  History of developmental delay: Denied. History of grade repetition: Denied. Enrollment in special education courses: Denied. History of LD/ADHD: Denied.  Employment: Retired. 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   Evaluation Results:   Behavioral Observations: Ms. Ruth was accompanied by her daughter, arrived to her appointment on time, and was appropriately dressed and groomed. She appeared alert and oriented. She ambulated slowly and cautiously but observed gait and station were otherwise within normal limits. Gross motor functioning appeared intact upon informal observation and no abnormal movements (e.g., tremors) were noted. Her affect was generally relaxed and positive. Spontaneous speech was fluent and word finding difficulties were not observed during the clinical interview. Thought processes were coherent, organized, and normal in content. Insight into her cognitive difficulties appeared generally adequate.   During testing, sustained attention was appropriate. Task engagement was adequate and she persisted when challenged. Word finding difficulties were observed only during a confrontation naming task. She also had some trouble comprehending task instructions across the latter conditions of the D-KEFS Color-Word task. Overall, Ms. Cowart was cooperative with the clinical interview and subsequent testing procedures.   Adequacy of Effort: The validity of neuropsychological testing is limited by the extent to which the individual being tested may be assumed to have exerted adequate effort during testing. Ms. Biondo expressed her intention to perform to the best of her abilities and exhibited adequate task engagement and persistence. Scores across stand-alone and embedded performance validity measures were within expectation. As such, the results of the current evaluation are believed to be a valid representation of Ms. Badger's current cognitive functioning.  Test Results: Ms. Piechota was generally oriented at the time of the current evaluation. She was one day off when stating the current date and did state her age  incorrectly ("88").   Intellectual abilities based upon educational and vocational attainment were estimated to be in the average to above average range. Premorbid abilities were estimated to be within the average range based upon a single-word reading test.   Processing speed was average to above average. Basic attention was exceptionally high. More complex attention (e.g., working memory) was above average. Executive functioning was somewhat variable. Performances fell in the average range across abstract reasoning abilities and visuomotor cognitive flexibility. However, performances ranged from the well below average to average normative ranges across a task assessing response inhibition.  While not directly assessed, receptive language abilities were believed to be intact. Likewise, Ms. Page answered all questions asked of her appropriately during conversation. Assessed expressive language (e.g., verbal fluency and confrontation naming) was below average to average.     Assessed visuospatial/visuoconstructional abilities were average to above average.    Learning (i.e., encoding) of novel verbal information was average across a story learning task but well below average across a list learning task. Spontaneous delayed recall (i.e., retrieval) of previously learned information was above average across a story task but exceptionally low across a list learning task. Retention rates were 81% across a story learning task, 0% (spontaneous) to 60% (cued) across a list learning task, and 20% across a figure recall task. Performance across recognition tasks was below average to average, suggesting some evidence for information consolidation.   Results of emotional screening instruments suggested that  recent symptoms of generalized anxiety were in the mild range, while symptoms of depression were also within the mild range. A screening instrument assessing recent sleep quality suggested the presence of  minimal sleep dysfunction.  Tables of Scores:   Note: This summary of test scores accompanies the interpretive report and should not be considered in isolation without reference to the appropriate sections in the text. Descriptors are based on appropriate normative data and may be adjusted based on clinical judgment. The terms "impaired" and "within normal limits (WNL)" are used when a more specific level of functioning cannot be determined. Descriptors refer to the current evaluation only.         Validity Testing:    [facility]   [facility]    Dot Counting Test: --- --- --- Within Expectation  WAIS-IV Reliable Digit Span: --- --- --- Within Expectation  CVLT-III Forced Choice Recognition: --- --- --- Within Expectation  D-KEFS Color Word Effort Index: --- --- --- Within Expectation        Orientation:       Raw Score Raw Score Percentile   NAB Orientation, Form 1 --- 27/29 --- ---        Cognitive Screening:             Raw Score Raw Score Percentile   SLUMS: --- 17/30 --- ---        Intellectual Functioning:             Standard Score Standard Score Percentile   Test of Premorbid Functioning: 104 108 70 Average        Memory:            Wechsler Memory Scale (WMS-IV):                       Raw Score* Raw Score (Scaled Score) Percentile     Logical Memory I 31/50 26/53 (10) 50 Average    Logical Memory II 25/50 17/39 (12) 75 Above Average    Logical Memory Recognition 26/30 19/23 51-75 Average  *Task administration during 2018 evaluation was incorrect (patient given adult rather than older adult version)         [place] Verbal Learning Test (CVLT-III) Brief Form: Raw Score Raw Score (Scaled/Standard Score) Percentile     Total Trials 1-4 27/36 17/36 (77) 6 Well Below Average    Short-Delay Free Recall 7/9 4/9 (5) 5 Well Below Average    Long-Delay Free Recall 7/9 0/9 (1) <1 Exceptionally Low    Long-Delay Cued Recall 8/9 3/9 (3) 1 Exceptionally Low       Recognition Hits 9/9 8/9 (10) 50 Average      False Positive Errors 1 1 (10) 50 Average         Scaled Score Raw Score (Scaled Score) Percentile   RBANS Figure Copy: 15/20 20/20 (14) 91 Above Average  RBANS Figure Recall: 10/20 4/20 (5) 5 Well Below Average    RBANS Figure Recognition: --- 4/8 21-38 Below Average to Average        Attention/Executive Function:            Trail Making Test (TMT): Raw Score Raw Score  (T Score) Percentile     Part A 23 secs.,  0 errors 43 secs.,  1 error (45) 31 Average    Part B 66 secs.,  0 errors 122 secs.,  5 errors (44) 27 Average          Scaled Score Scaled  Score Percentile   WAIS-IV Coding: 14 13 84 Above Average         Scaled Score Scaled Score Percentile   WAIS-IV Digit Span: --- 15 95 Well Above Average    Forward 17 17 99 Exceptionally High    Backward 15 14 91 Above Average    Sequencing --- 14 91 Above Average         Scaled Score Scaled Score Percentile   WAIS-IV Similarities: 11 10 50 Average        D-KEFS Color-Word Interference Test: Raw Score Raw Score (Scaled Score) Percentile     Color Naming --- 36 secs. (10) 50 Average    Word Reading --- 21 secs. (13) 84 Above Average    Inhibition --- 94 secs. (10) 50 Average      Total Errors --- 10 errors (5) 5 Well Below Average    Inhibition/Switching --- 144 secs. (5) 5 Well Below Average      Total Errors --- 8 errors (6) 9 Below Average        Language:            Verbal Fluency Test: Raw Score Raw Score  (T Score) Percentile     Phonemic Fluency (FAS) 49 48 (53) 62 Average    Animal Fluency 15 16 (42) 21 Below Average         NAB Language Module, Form 1: T Score T Score Percentile     Naming --- 28/31 (44) 27 Average         Raw Score Raw Score Percentile   [place] Naming Test: 59/60 --- --- ---        Visuospatial/Visuoconstruction:       Raw Score Raw Score Percentile   Clock Drawing: WNL 10/10 --- Within Normal Limits         Scaled Score Scaled Score  Percentile   WAIS-IV Block Design: 11 11 63 Average        Mood and Personality:       Raw Score Raw Score Percentile   Geriatric Depression Scale: --- 16 --- Mild  Geriatric Anxiety Scale: --- 20 --- Mild    Somatic --- 9 --- Mild    Cognitive --- 3 --- Mild    Affective --- 8 --- Moderate        Additional Questionnaires:       Raw Score Raw Score Percentile   PROMIS Sleep Disturbance Questionnaire: --- 20 --- None to Slight   Informed Consent and Coding/Compliance:   The current evaluation represents a clinical evaluation for the purposes previously outlined by the referral source and is in no way reflective of a forensic evaluation.   Ms. Rosete was provided with a verbal description of the nature and purpose of the present neuropsychological evaluation. Also reviewed were the foreseeable risks and/or discomforts and benefits of the procedure, limits of confidentiality, and mandatory reporting requirements of this provider. The patient was given the opportunity to ask questions and receive answers about the evaluation. Oral consent to participate was provided by the patient.   This evaluation was conducted by Christia Reading, Ph.D., licensed clinical neuropsychologist. Ms. Teti completed a clinical interview with Dr. Melvyn Novas, billed as one unit [phone], and 120 minutes of cognitive testing and scoring, billed as one unit [phone] and three additional units [id number]. Psychometrist Milana Kidney, B.S., assisted Dr. Melvyn Novas with test administration and scoring procedures. As a separate and discrete service, Dr. Melvyn Novas spent a total of 160 minutes in  interpretation and report writing billed as one unit [phone] and two units 96133.

## 2020-08-04 ENCOUNTER — Encounter: Payer: Self-pay | Admitting: Psychology

## 2020-08-10 DIAGNOSIS — M5136 Other intervertebral disc degeneration, lumbar region: Secondary | ICD-10-CM | POA: Diagnosis not present

## 2020-08-10 DIAGNOSIS — M9903 Segmental and somatic dysfunction of lumbar region: Secondary | ICD-10-CM | POA: Diagnosis not present

## 2020-08-10 DIAGNOSIS — M9902 Segmental and somatic dysfunction of thoracic region: Secondary | ICD-10-CM | POA: Diagnosis not present

## 2020-08-10 DIAGNOSIS — M5134 Other intervertebral disc degeneration, thoracic region: Secondary | ICD-10-CM | POA: Diagnosis not present

## 2020-08-11 DIAGNOSIS — M47816 Spondylosis without myelopathy or radiculopathy, lumbar region: Secondary | ICD-10-CM | POA: Diagnosis not present

## 2020-08-14 ENCOUNTER — Other Ambulatory Visit: Payer: Self-pay

## 2020-08-14 ENCOUNTER — Ambulatory Visit (INDEPENDENT_AMBULATORY_CARE_PROVIDER_SITE_OTHER): Payer: Medicare Other | Admitting: Psychology

## 2020-08-14 ENCOUNTER — Other Ambulatory Visit: Payer: Self-pay | Admitting: Pulmonary Disease

## 2020-08-14 DIAGNOSIS — G3184 Mild cognitive impairment, so stated: Secondary | ICD-10-CM

## 2020-08-14 DIAGNOSIS — F4323 Adjustment disorder with mixed anxiety and depressed mood: Secondary | ICD-10-CM | POA: Diagnosis not present

## 2020-08-14 NOTE — Telephone Encounter (Signed)
Requesting refill.  Thanks!

## 2020-08-14 NOTE — Progress Notes (Signed)
   Neuropsychology Feedback Session Tillie Rung. [place] Department of Neurology  Reason for Referral:   [name] a 87 y.o. right-handed Caucasian female referred by Narda Amber, D.O.,to characterize hercurrent cognitive functioning and assist with diagnostic clarity and treatment planning in the context of subjective cognitive decline.   Feedback:   Ms. Gammon completed a comprehensive neuropsychological evaluation on 08/03/2020. Please refer to that encounter for the full report and recommendations. Briefly, results suggested variability across memory measures, with notable impairment learning and later recalling list-based information. Recall was also impaired on a figure drawing task, while performance was appropriate across all aspects of a story-based memory task. Additional relative weaknesses were exhibited across response inhibition and semantic fluency. Relative to her previous evaluation, notable performance declines were seen across encoding (i.e., learning) and retrieval aspects of list-based information, retrieval of a previously learned complex figure, and performance across a visuomotor task assessing cognitive flexibility; however, the latter remained in the average normative range. A more subtle decline was exhibited across confrontation naming; however, this task also remained in the average normative range. All other performances were stable, including strong scores across a story-based verbal memory task. Memory dysfunction, coupled with a relative weakness across semantic fluency, variability across executive functioning, and subtle decline across confrontation naming could be concerning for the very early beginning of a neurodegenerative process such as Alzheimer's disease. However, it is important to highlight that performance across a story-based memory task was stable and strong across the current evaluation. Additionally, despite weaknesses on  other memory tasks, she still performed reasonably well across recognition tasks, which does not suggest evidence for an information storage deficit at the present time. Overall, this is encouraging despite evidence for cognitive decline at the present time.  Ms. Madariaga was accompanied by her daughter during feedback. Content of the current session focused on the results of her neuropsychological evaluation. Ms. Moomaw and her daughter were given the opportunity to ask questions and their questions were answered. They were encouraged to reach out should additional questions arise. A copy of her report was provided at the conclusion of the visit.      30 minutes were spent conducting the current feedback session with Ms. Fauteux, billed as one unit [phone].

## 2020-08-15 ENCOUNTER — Encounter: Payer: Self-pay | Admitting: Pulmonary Disease

## 2020-08-15 ENCOUNTER — Ambulatory Visit (INDEPENDENT_AMBULATORY_CARE_PROVIDER_SITE_OTHER): Payer: Medicare Other | Admitting: Pulmonary Disease

## 2020-08-15 VITALS — BP 144/76 | HR 76 | Temp 98.0°F | Ht 65.0 in | Wt 138.0 lb

## 2020-08-15 DIAGNOSIS — J4531 Mild persistent asthma with (acute) exacerbation: Secondary | ICD-10-CM | POA: Diagnosis not present

## 2020-08-15 DIAGNOSIS — J3089 Other allergic rhinitis: Secondary | ICD-10-CM | POA: Diagnosis not present

## 2020-08-15 MED ORDER — AZELASTINE HCL 0.1 % NA SOLN: 1.0000 | Freq: Two times a day (BID) | NASAL | 12 refills | Status: DC

## 2020-08-15 NOTE — Progress Notes (Signed)
Synopsis: Longstanding patient of Dr. Lake Bells managed here for asthma  Subjective:   PATIENT ID: [name] GENDER: female DOB: 1933-11-27, MRN: [id number]  Chief Complaint  Patient presents with  . Follow-up    Productive cough, allergies    HPI Here for asthma, recurrent sinus congestion complicated by anxiety disorder. Regimen consists of symbicort 80/4.5 BID, singulair, omeprazole, flonase, zyrtec, PRN xanax. Cough a little worse last few months. Associated with post nasal drip symptoms. Worse with pollen, when outdoors.    Labs reviewed and notable for:  IgE 3 2017, 4 2009 Eos not elevated TSH WNL  ROS No orthopnea or PND. No chest pain. Comprehensive review of systems negative.   Objective:  GEN: elderly woman in NAD HEENT: MMM, trachea midline CV: RRR, ext warm PULM: clear in all lung fields, no accessory muscle use GI: Soft, +BS EXT: no edema PSYCH: AOx3, normal affect SKIN: No rashes    Vitals:   08/15/20 1148  BP: (!) 144/76  Pulse: 76  Temp: 98 F (36.7 C)  SpO2: 96%  Weight: 138 lb (62.6 kg)  Height: 5\' 5"  (1.651 m)   96% on RA BMI Readings from Last 3 Encounters:  08/15/20 22.96 kg/m  05/25/20 24.62 kg/m  02/08/20 23.00 kg/m   Wt Readings from Last 3 Encounters:  08/15/20 138 lb (62.6 kg)  05/25/20 139 lb (63 kg)  02/08/20 134 lb (60.8 kg)     CBC    Component Value Date/Time   WBC 7.6 03/29/2019 1046   WBC 7.0 10/01/2017 1114   RBC 4.67 03/29/2019 1046   RBC 4.61 10/01/2017 1114   HGB 14.4 03/29/2019 1046   HGB 12.6 05/20/2011 1331   HCT 42.3 03/29/2019 1046   HCT 37.5 05/20/2011 1331   PLT 232 03/29/2019 1046   MCV 91 03/29/2019 1046   MCV 93.7 05/20/2011 1331   MCH 30.8 03/29/2019 1046   MCH 29.9 10/01/2017 1114   MCHC 34.0 03/29/2019 1046   MCHC 33.6 10/01/2017 1114   RDW 12.4 03/29/2019 1046   RDW 15.3 (H) 05/20/2011 1331   LYMPHSABS 1.2 03/29/2019 1046   LYMPHSABS 1.3 05/20/2011 1331   MONOABS 1.2 (H)  09/07/2016 1725   MONOABS 0.4 05/20/2011 1331   EOSABS 0.1 03/29/2019 1046   BASOSABS 0.0 03/29/2019 1046   BASOSABS 0.0 05/20/2011 1331    Chest Imaging: CXR 2019 - clear lungs, mild hyperinflation on my interpretation   Pulmonary Functions Testing Results: PFT Results Latest Ref Rng & Units 08/18/2015  FVC-Pre L 2.43  FVC-Predicted Pre % 98  FVC-Post L 2.45  FVC-Predicted Post % 98  Pre FEV1/FVC % % 84  Post FEV1/FCV % % 86  FEV1-Pre L 2.05  FEV1-Predicted Pre % 111  FEV1-Post L 2.10  DLCO uncorrected ml/min/mmHg 17.47  DLCO UNC% % 76  DLCO corrected ml/min/mmHg 17.02  DLCO COR %Predicted % 74  DLVA Predicted % 88  TLC L 4.57  TLC % Predicted % 93  RV % Predicted % 92  Interpretation: 2017 WNL, moderate fixed obstruction in 2426  Echo diastolic dysfunction 8341 Nuclear stress WNL 2019   Assessment & Plan:   # Mild persistent asthma with superimposed panic attack disorder # Panic attacks worsened by frequent albuterol and prednisone Rx; using xanax has prevented her from needing these meds, using 1/4 tab of 0.25 mg tablet twice a day, cutting down # Cough, seasonal allergies: Suspect intertwined  Discussion: - Continue symbicort 80/4.5 BID and albuterol PRN - Add Azelastine  1 spray BID -Plan increase symbicort to high dose if cough not improving in coming weeks -TSH, free T4 -Continue low dose xanax for now, will discuss with PCP in terms of taking over prescibing     Current Outpatient Medications:  .  acetaminophen (TYLENOL) 500 MG tablet, Take by mouth as needed., Disp: , Rfl:  .  ALPRAZolam (XANAX) 0.25 MG tablet, TAKE 1 TABLET BY MOUTH TWO TIMES DAILY AS NEEDED FOR ANXIETY, Disp: 60 tablet, Rfl: 0 .  amLODipine (NORVASC) 5 MG tablet, TAKE 1 TABLET BY MOUTH ONCE DAILY, Disp: 90 tablet, Rfl: 1 .  azelastine (ASTELIN) 0.1 % nasal spray, Place 1 spray into both nostrils 2 (two) times daily. Use in each nostril as directed, Disp: 30 mL, Rfl: 12 .  busPIRone  (BUSPAR) 15 MG tablet, Take 0.5 tablets (7.5 mg total) by mouth 2 (two) times daily. Patient reports shes taking 1/2 tablet BID (Patient taking differently: Take 7.5 mg by mouth 2 (two) times daily. Patient reports shes taking 1/4 tablet BID), Disp: 90 tablet, Rfl: 1 .  cetirizine (ZYRTEC) 10 MG tablet, Take 10 mg by mouth daily. , Disp: , Rfl:  .  chlorthalidone (HYGROTON) 25 MG tablet, TAKE 1/2 TABLET BY MOUTH DAILY, Disp: 45 tablet, Rfl: 9 .  Cholecalciferol (VITAMIN D3) 5000 units TABS, 5,000 IU OTC vitamin D3 daily., Disp: 90 tablet, Rfl: 3 .  Cyanocobalamin (VITAMIN B-12) 2500 MCG TABS, Take 1,250 mcg by mouth daily., Disp: , Rfl:  .  DULoxetine (CYMBALTA) 30 MG capsule, Take 1 capsule (30 mg total) by mouth daily., Disp: 90 capsule, Rfl: 0 .  ferrous sulfate (FERROUSUL) 325 (65 FE) MG tablet, Take 1 tablet (325 mg total) by mouth 3 (three) times daily with meals. (Patient taking differently: Take 325 mg by mouth daily.), Disp: , Rfl:  .  fluticasone (FLONASE) 50 MCG/ACT nasal spray, USE 2 SPRAYS INTO EACH NOSTRIL EVERY DAY, Disp: 48 g, Rfl: 3 .  losartan (COZAAR) 100 MG tablet, TAKE 1 TABLET BY MOUTH DAILY, Disp: 90 tablet, Rfl: 1 .  methocarbamol (ROBAXIN) 500 MG tablet, as needed., Disp: , Rfl:  .  montelukast (SINGULAIR) 10 MG tablet, TAKE 1 TABLET BY MOUTH ONCE DAILY AT BEDTIME, Disp: 90 tablet, Rfl: 1 .  omeprazole (PRILOSEC) 20 MG capsule, TAKE 1 CAPSULE BY MOUTH DAILY, Disp: 90 capsule, Rfl: 1 .  SYMBICORT 80-4.5 MCG/ACT inhaler, INHALE 2 PUFFS BY MOUTH INTO THE LUNGS TWICE A DAY, Disp: 10.2 g, Rfl: 5 .  traMADol (ULTRAM) 50 MG tablet, Take by mouth as needed., Disp: , Rfl:    Lanier Clam, MD [facility] 08/15/2020 1:40 PM

## 2020-08-15 NOTE — Patient Instructions (Addendum)
Nice to see you  In addition to [facility] and your other pills for allergies, use azelastine spray 1 spray each nostril twice a day.  Hopefully this will help with the mucus production of the cough.  If things are not improving in the next 2 to 4 weeks, please call me and we will plan to increase the dose of your Symbicort.  I am hopeful symptoms will just gradually improve as the pollen lessens over the coming weeks.  Return to clinic in 6 months or sooner as needed

## 2020-08-24 DIAGNOSIS — M9904 Segmental and somatic dysfunction of sacral region: Secondary | ICD-10-CM | POA: Diagnosis not present

## 2020-08-24 DIAGNOSIS — M5134 Other intervertebral disc degeneration, thoracic region: Secondary | ICD-10-CM | POA: Diagnosis not present

## 2020-08-24 DIAGNOSIS — M9905 Segmental and somatic dysfunction of pelvic region: Secondary | ICD-10-CM | POA: Diagnosis not present

## 2020-08-24 DIAGNOSIS — M9903 Segmental and somatic dysfunction of lumbar region: Secondary | ICD-10-CM | POA: Diagnosis not present

## 2020-08-30 DIAGNOSIS — M9903 Segmental and somatic dysfunction of lumbar region: Secondary | ICD-10-CM | POA: Diagnosis not present

## 2020-08-30 DIAGNOSIS — M9905 Segmental and somatic dysfunction of pelvic region: Secondary | ICD-10-CM | POA: Diagnosis not present

## 2020-08-30 DIAGNOSIS — M5134 Other intervertebral disc degeneration, thoracic region: Secondary | ICD-10-CM | POA: Diagnosis not present

## 2020-08-30 DIAGNOSIS — M9904 Segmental and somatic dysfunction of sacral region: Secondary | ICD-10-CM | POA: Diagnosis not present

## 2020-09-04 DIAGNOSIS — M791 Myalgia, unspecified site: Secondary | ICD-10-CM | POA: Diagnosis not present

## 2020-09-04 DIAGNOSIS — M542 Cervicalgia: Secondary | ICD-10-CM | POA: Diagnosis not present

## 2020-09-04 DIAGNOSIS — M461 Sacroiliitis, not elsewhere classified: Secondary | ICD-10-CM | POA: Diagnosis not present

## 2020-09-07 DIAGNOSIS — M9905 Segmental and somatic dysfunction of pelvic region: Secondary | ICD-10-CM | POA: Diagnosis not present

## 2020-09-07 DIAGNOSIS — M9903 Segmental and somatic dysfunction of lumbar region: Secondary | ICD-10-CM | POA: Diagnosis not present

## 2020-09-07 DIAGNOSIS — M9904 Segmental and somatic dysfunction of sacral region: Secondary | ICD-10-CM | POA: Diagnosis not present

## 2020-09-07 DIAGNOSIS — M5134 Other intervertebral disc degeneration, thoracic region: Secondary | ICD-10-CM | POA: Diagnosis not present

## 2020-09-14 DIAGNOSIS — M9905 Segmental and somatic dysfunction of pelvic region: Secondary | ICD-10-CM | POA: Diagnosis not present

## 2020-09-14 DIAGNOSIS — M5134 Other intervertebral disc degeneration, thoracic region: Secondary | ICD-10-CM | POA: Diagnosis not present

## 2020-09-14 DIAGNOSIS — M9903 Segmental and somatic dysfunction of lumbar region: Secondary | ICD-10-CM | POA: Diagnosis not present

## 2020-09-14 DIAGNOSIS — M9904 Segmental and somatic dysfunction of sacral region: Secondary | ICD-10-CM | POA: Diagnosis not present

## 2020-09-19 ENCOUNTER — Encounter: Payer: Self-pay | Admitting: Cardiovascular Disease

## 2020-09-19 ENCOUNTER — Ambulatory Visit (INDEPENDENT_AMBULATORY_CARE_PROVIDER_SITE_OTHER): Payer: Medicare Other | Admitting: Cardiovascular Disease

## 2020-09-19 ENCOUNTER — Other Ambulatory Visit: Payer: Self-pay

## 2020-09-19 VITALS — BP 130/72 | HR 89 | Ht 65.0 in | Wt 137.0 lb

## 2020-09-19 DIAGNOSIS — E782 Mixed hyperlipidemia: Secondary | ICD-10-CM | POA: Diagnosis not present

## 2020-09-19 DIAGNOSIS — I10 Essential (primary) hypertension: Secondary | ICD-10-CM

## 2020-09-19 DIAGNOSIS — I447 Left bundle-branch block, unspecified: Secondary | ICD-10-CM

## 2020-09-19 NOTE — Assessment & Plan Note (Signed)
History of hyperlipidemia intolerant to statin therapy with lipid profile performed 06/30/2019 revealing a total cholesterol of 326, LDL of 233 and HDL of 82.

## 2020-09-19 NOTE — Patient Instructions (Signed)
Medication Instructions:  Your Physician recommend you continue on your current medication as directed.    *If you need a refill on your cardiac medications before your next appointment, please call your pharmacy*   Lab Work: None ordered today   Testing/Procedures: None ordered today   Follow-Up: At [facility], you and your health needs are our priority.  As part of our continuing mission to provide you with exceptional heart care, we have created designated Provider Care Teams.  These Care Teams include your primary Cardiologist (physician) and Advanced Practice Providers (APPs -  Physician Assistants and Nurse Practitioners) who all work together to provide you with the care you need, when you need it.  We recommend signing up for the patient portal called "MyChart".  Sign up information is provided on this After Visit Summary.  MyChart is used to connect with patients for Virtual Visits (Telemedicine).  Patients are able to view lab/test results, encounter notes, upcoming appointments, etc.  Non-urgent messages can be sent to your provider as well.   To learn more about what you can do with MyChart, go to [email or web address].    Your next appointment:   1 year(s)  The format for your next appointment:   In Person  Provider:   Jonathan Berry, MD    

## 2020-09-19 NOTE — Assessment & Plan Note (Signed)
History of essential hypertension a blood pressure measured today 130/72.  She is on amlodipine, chlorthalidone and losartan.

## 2020-09-19 NOTE — Progress Notes (Signed)
09/19/2020 [name]   06/17/1933  [id number]  Primary Physician [name] Jesse Sans, MD Primary Cardiologist: [name] MD Lupe Carney, [place]  HPI:  [name] is a 87 y.o.  mildly-overweight married Caucasian female, mother of 3 and grandmother of 1 grandchild, who worked as a Acupuncturist. I last saw her in the office 65/26/21.  Her husband [name] was a patient of mine as well unfortunately had  [place] body dementia and passed away in 22-Sep-2022 of last year. She lives  by herself with her daughter [name] who lives 2 miles away and a son who lives up in [place]..Her risk factors include type 2 diabetes, hypertension, and hyperlipidemia. She does have fibromyalgia. She has had a normal 2D echo and Myoview stress test in the past.  Since I saw her a little over a year ago she unfortunately had to have a redo right total hip replacement on 09/24/2016 by Dr. Alvan Dame.  She denies chest pain or shortness of breath.  She does have purplish discoloration of both feet despite normal pulses and normal Doppler studies for unclear reasons.   She saw [name]  NP in the office 01/29/2018 with atypical chest pain and asthma type symptoms.  Myoview stress test was low risk nonischemic, and echo revealed normal LV function with a anteroapical wall motion abnormality.  I did not think her symptoms were ischemically mediated.   Since I saw her in the office a year ago she continues to do well.  She walks outside with a walker twice a day.  She denies chest pain or shortness of breath.     Current Meds  Medication Sig   acetaminophen (TYLENOL) 500 MG tablet Take by mouth as needed.   ALPRAZolam (XANAX) 0.25 MG tablet TAKE 1 TABLET BY MOUTH TWO TIMES DAILY AS NEEDED FOR ANXIETY   amLODipine (NORVASC) 5 MG tablet TAKE 1 TABLET BY MOUTH ONCE DAILY   azelastine (ASTELIN) 0.1 % nasal spray Place 1 spray into both nostrils 2 (two) times daily. Use in each nostril as directed    busPIRone (BUSPAR) 15 MG tablet Take 0.5 tablets (7.5 mg total) by mouth 2 (two) times daily. Patient reports shes taking 1/2 tablet BID (Patient taking differently: Take 7.5 mg by mouth 2 (two) times daily. Patient reports shes taking 1/4 tablet BID)   cetirizine (ZYRTEC) 10 MG tablet Take 10 mg by mouth daily.    chlorthalidone (HYGROTON) 25 MG tablet TAKE 1/2 TABLET BY MOUTH DAILY   Cholecalciferol (VITAMIN D3) 5000 units TABS 5,000 IU OTC vitamin D3 daily.   Cyanocobalamin (VITAMIN B-12) 2500 MCG TABS Take 1,250 mcg by mouth daily.   DULoxetine (CYMBALTA) 30 MG capsule Take 1 capsule (30 mg total) by mouth daily. (Patient taking differently: Take 60 mg by mouth daily.)   ferrous sulfate (FERROUSUL) 325 (65 FE) MG tablet Take 1 tablet (325 mg total) by mouth 3 (three) times daily with meals. (Patient taking differently: Take 325 mg by mouth daily.)   fluticasone (FLONASE) 50 MCG/ACT nasal spray USE 2 SPRAYS INTO EACH NOSTRIL EVERY DAY   losartan (COZAAR) 100 MG tablet TAKE 1 TABLET BY MOUTH DAILY   methocarbamol (ROBAXIN) 500 MG tablet as needed.   montelukast (SINGULAIR) 10 MG tablet TAKE 1 TABLET BY MOUTH ONCE DAILY AT BEDTIME   omeprazole (PRILOSEC) 20 MG capsule TAKE 1 CAPSULE BY MOUTH DAILY   SYMBICORT 80-4.5 MCG/ACT inhaler INHALE 2 PUFFS BY MOUTH INTO THE  LUNGS TWICE A DAY   traMADol (ULTRAM) 50 MG tablet Take by mouth as needed.     Allergies  Allergen Reactions   Morphine    Morphine Sulfate Other (See Comments)    Makes her hyper    Social History   Socioeconomic History   Marital status: Widowed    Spouse name: Not on file   Number of children: 2   Years of education: 59   Highest education level: Master's degree (e.g., MA, MS, MEng, MEd, MSW, MBA)  Occupational History   Occupation: Retired    Comment: Head of [employer]  Tobacco Use   Smoking status: Former    Packs/day: 0.10    Years: 10.00    Pack years: 1.00    Types: Cigarettes    Quit date: 03/25/1968     Years since quitting: 52.5   Smokeless tobacco: Never  Vaping Use   Vaping Use: Never used  Substance and Sexual Activity   Alcohol use: No    Alcohol/week: 0.0 standard drinks   Drug use: No   Sexual activity: Not Currently  Other Topics Concern   Not on file  Social History Narrative   Lives alone  in a 3 story home.  Has 2 children.  Retired Glass blower/designer.  Education: Scientist, water quality. Right handed    Social Determinants of Health   Financial Resource Strain: Not on file  Food Insecurity: Not on file  Transportation Needs: Not on file  Physical Activity: Not on file  Stress: Not on file  Social Connections: Not on file  Intimate Partner Violence: Not on file     Review of Systems: General: negative for chills, fever, night sweats or weight changes.  Cardiovascular: negative for chest pain, dyspnea on exertion, edema, orthopnea, palpitations, paroxysmal nocturnal dyspnea or shortness of breath Dermatological: negative for rash Respiratory: negative for cough or wheezing Urologic: negative for hematuria Abdominal: negative for nausea, vomiting, diarrhea, bright red blood per rectum, melena, or hematemesis Neurologic: negative for visual changes, syncope, or dizziness All other systems reviewed and are otherwise negative except as noted above.    Blood pressure 130/72, pulse 89, height 5\' 5"  (1.651 m), weight 137 lb (62.1 kg).  General appearance: alert and no distress Neck: no adenopathy, no carotid bruit, no JVD, supple, symmetrical, trachea midline, and thyroid not enlarged, symmetric, no tenderness/mass/nodules Lungs: clear to auscultation bilaterally Heart: regular rate and rhythm, S1, S2 normal, no murmur, click, rub or gallop Extremities: extremities normal, atraumatic, no cyanosis or edema Pulses: 2+ and symmetric Skin: Skin color, texture, turgor normal. No rashes or lesions Neurologic: Grossly normal  EKG sinus rhythm 89 with left bundle branch block.  I  personally reviewed this EKG.  ASSESSMENT AND PLAN:   Hyperlipidemia History of hyperlipidemia intolerant to statin therapy with lipid profile performed 06/30/2019 revealing a total cholesterol of 326, LDL of 233 and HDL of 82.  Essential hypertension History of essential hypertension a blood pressure measured today 130/72.  She is on amlodipine, chlorthalidone and losartan.     [name] MD FACP,FACC,FAHA, [facility] 09/19/2020 11:41 AM

## 2020-09-21 DIAGNOSIS — M9903 Segmental and somatic dysfunction of lumbar region: Secondary | ICD-10-CM | POA: Diagnosis not present

## 2020-09-21 DIAGNOSIS — Z23 Encounter for immunization: Secondary | ICD-10-CM | POA: Diagnosis not present

## 2020-09-21 DIAGNOSIS — M9905 Segmental and somatic dysfunction of pelvic region: Secondary | ICD-10-CM | POA: Diagnosis not present

## 2020-09-21 DIAGNOSIS — M5134 Other intervertebral disc degeneration, thoracic region: Secondary | ICD-10-CM | POA: Diagnosis not present

## 2020-09-21 DIAGNOSIS — M9904 Segmental and somatic dysfunction of sacral region: Secondary | ICD-10-CM | POA: Diagnosis not present

## 2020-09-28 ENCOUNTER — Ambulatory Visit
Admission: RE | Admit: 2020-09-28 | Discharge: 2020-09-28 | Disposition: A | Discharge status: Home / Self Care | Payer: Medicare Other | Source: Ambulatory Visit | Attending: Internal Medicine | Admitting: Internal Medicine

## 2020-09-28 ENCOUNTER — Other Ambulatory Visit: Payer: Self-pay

## 2020-09-28 DIAGNOSIS — Z780 Asymptomatic menopausal state: Secondary | ICD-10-CM | POA: Diagnosis not present

## 2020-09-28 DIAGNOSIS — Z13820 Encounter for screening for osteoporosis: Secondary | ICD-10-CM

## 2020-09-28 DIAGNOSIS — M9903 Segmental and somatic dysfunction of lumbar region: Secondary | ICD-10-CM | POA: Diagnosis not present

## 2020-09-28 DIAGNOSIS — M9904 Segmental and somatic dysfunction of sacral region: Secondary | ICD-10-CM | POA: Diagnosis not present

## 2020-09-28 DIAGNOSIS — M9905 Segmental and somatic dysfunction of pelvic region: Secondary | ICD-10-CM | POA: Diagnosis not present

## 2020-09-28 DIAGNOSIS — M5134 Other intervertebral disc degeneration, thoracic region: Secondary | ICD-10-CM | POA: Diagnosis not present

## 2020-09-29 DIAGNOSIS — R7309 Other abnormal glucose: Secondary | ICD-10-CM | POA: Diagnosis not present

## 2020-09-29 DIAGNOSIS — E559 Vitamin D deficiency, unspecified: Secondary | ICD-10-CM | POA: Diagnosis not present

## 2020-09-29 DIAGNOSIS — E538 Deficiency of other specified B group vitamins: Secondary | ICD-10-CM | POA: Diagnosis not present

## 2020-09-29 DIAGNOSIS — E785 Hyperlipidemia, unspecified: Secondary | ICD-10-CM | POA: Diagnosis not present

## 2020-10-02 ENCOUNTER — Ambulatory Visit: Payer: Medicare Other | Admitting: Rheumatology

## 2020-10-05 DIAGNOSIS — F419 Anxiety disorder, unspecified: Secondary | ICD-10-CM | POA: Diagnosis not present

## 2020-10-05 DIAGNOSIS — M810 Age-related osteoporosis without current pathological fracture: Secondary | ICD-10-CM | POA: Diagnosis not present

## 2020-10-05 DIAGNOSIS — Z1331 Encounter for screening for depression: Secondary | ICD-10-CM | POA: Diagnosis not present

## 2020-10-05 DIAGNOSIS — I129 Hypertensive chronic kidney disease with stage 1 through stage 4 chronic kidney disease, or unspecified chronic kidney disease: Secondary | ICD-10-CM | POA: Diagnosis not present

## 2020-10-05 DIAGNOSIS — Z1339 Encounter for screening examination for other mental health and behavioral disorders: Secondary | ICD-10-CM | POA: Diagnosis not present

## 2020-10-05 DIAGNOSIS — Z0000 Encounter for general adult medical examination without abnormal findings: Secondary | ICD-10-CM | POA: Diagnosis not present

## 2020-10-05 DIAGNOSIS — I700 Atherosclerosis of aorta: Secondary | ICD-10-CM | POA: Diagnosis not present

## 2020-10-05 DIAGNOSIS — M797 Fibromyalgia: Secondary | ICD-10-CM | POA: Diagnosis not present

## 2020-10-05 DIAGNOSIS — F339 Major depressive disorder, recurrent, unspecified: Secondary | ICD-10-CM | POA: Diagnosis not present

## 2020-10-05 DIAGNOSIS — Z87891 Personal history of nicotine dependence: Secondary | ICD-10-CM | POA: Diagnosis not present

## 2020-10-05 DIAGNOSIS — R7309 Other abnormal glucose: Secondary | ICD-10-CM | POA: Diagnosis not present

## 2020-10-05 DIAGNOSIS — N3946 Mixed incontinence: Secondary | ICD-10-CM | POA: Diagnosis not present

## 2020-10-11 DIAGNOSIS — M9904 Segmental and somatic dysfunction of sacral region: Secondary | ICD-10-CM | POA: Diagnosis not present

## 2020-10-11 DIAGNOSIS — M5134 Other intervertebral disc degeneration, thoracic region: Secondary | ICD-10-CM | POA: Diagnosis not present

## 2020-10-11 DIAGNOSIS — M9903 Segmental and somatic dysfunction of lumbar region: Secondary | ICD-10-CM | POA: Diagnosis not present

## 2020-10-11 DIAGNOSIS — M9905 Segmental and somatic dysfunction of pelvic region: Secondary | ICD-10-CM | POA: Diagnosis not present

## 2020-10-19 DIAGNOSIS — M5134 Other intervertebral disc degeneration, thoracic region: Secondary | ICD-10-CM | POA: Diagnosis not present

## 2020-10-19 DIAGNOSIS — M9904 Segmental and somatic dysfunction of sacral region: Secondary | ICD-10-CM | POA: Diagnosis not present

## 2020-10-19 DIAGNOSIS — N390 Urinary tract infection, site not specified: Secondary | ICD-10-CM | POA: Diagnosis not present

## 2020-10-19 DIAGNOSIS — M9903 Segmental and somatic dysfunction of lumbar region: Secondary | ICD-10-CM | POA: Diagnosis not present

## 2020-10-19 DIAGNOSIS — M9905 Segmental and somatic dysfunction of pelvic region: Secondary | ICD-10-CM | POA: Diagnosis not present

## 2020-10-26 DIAGNOSIS — M9905 Segmental and somatic dysfunction of pelvic region: Secondary | ICD-10-CM | POA: Diagnosis not present

## 2020-10-26 DIAGNOSIS — M9904 Segmental and somatic dysfunction of sacral region: Secondary | ICD-10-CM | POA: Diagnosis not present

## 2020-10-26 DIAGNOSIS — M9903 Segmental and somatic dysfunction of lumbar region: Secondary | ICD-10-CM | POA: Diagnosis not present

## 2020-10-26 DIAGNOSIS — M5136 Other intervertebral disc degeneration, lumbar region: Secondary | ICD-10-CM | POA: Diagnosis not present

## 2020-11-02 DIAGNOSIS — M9904 Segmental and somatic dysfunction of sacral region: Secondary | ICD-10-CM | POA: Diagnosis not present

## 2020-11-02 DIAGNOSIS — M5136 Other intervertebral disc degeneration, lumbar region: Secondary | ICD-10-CM | POA: Diagnosis not present

## 2020-11-02 DIAGNOSIS — M9905 Segmental and somatic dysfunction of pelvic region: Secondary | ICD-10-CM | POA: Diagnosis not present

## 2020-11-02 DIAGNOSIS — M9903 Segmental and somatic dysfunction of lumbar region: Secondary | ICD-10-CM | POA: Diagnosis not present

## 2020-11-03 ENCOUNTER — Other Ambulatory Visit: Payer: Self-pay | Admitting: Cardiovascular Disease

## 2020-11-03 ENCOUNTER — Other Ambulatory Visit: Payer: Self-pay | Admitting: Pulmonary Disease

## 2020-11-06 NOTE — Telephone Encounter (Signed)
Received a request for a refill on patient's alprazolam 0.'25mg'$ . She last received this RX on 08/14/20 for 60 tablets. She was last seen by [place] on 08/15/20 and advised to follow up in 6 months.   MH, please advise if you are ok with this refill. Thanks!

## 2020-11-09 DIAGNOSIS — M5136 Other intervertebral disc degeneration, lumbar region: Secondary | ICD-10-CM | POA: Diagnosis not present

## 2020-11-09 DIAGNOSIS — M9903 Segmental and somatic dysfunction of lumbar region: Secondary | ICD-10-CM | POA: Diagnosis not present

## 2020-11-09 DIAGNOSIS — M9905 Segmental and somatic dysfunction of pelvic region: Secondary | ICD-10-CM | POA: Diagnosis not present

## 2020-11-09 DIAGNOSIS — M9904 Segmental and somatic dysfunction of sacral region: Secondary | ICD-10-CM | POA: Diagnosis not present

## 2020-11-16 DIAGNOSIS — M9904 Segmental and somatic dysfunction of sacral region: Secondary | ICD-10-CM | POA: Diagnosis not present

## 2020-11-16 DIAGNOSIS — M5136 Other intervertebral disc degeneration, lumbar region: Secondary | ICD-10-CM | POA: Diagnosis not present

## 2020-11-16 DIAGNOSIS — M9903 Segmental and somatic dysfunction of lumbar region: Secondary | ICD-10-CM | POA: Diagnosis not present

## 2020-11-16 DIAGNOSIS — M9905 Segmental and somatic dysfunction of pelvic region: Secondary | ICD-10-CM | POA: Diagnosis not present

## 2020-11-23 ENCOUNTER — Encounter: Payer: Medicare Other | Admitting: Adult Health

## 2020-11-23 ENCOUNTER — Telehealth: Payer: Self-pay | Admitting: Pulmonary Disease

## 2020-11-23 DIAGNOSIS — J4530 Mild persistent asthma, uncomplicated: Secondary | ICD-10-CM | POA: Diagnosis not present

## 2020-11-23 DIAGNOSIS — U071 COVID-19: Secondary | ICD-10-CM | POA: Diagnosis not present

## 2020-11-23 NOTE — Progress Notes (Signed)
Patient reports that her PCP has sent in some Paxlovid. She does not want a visit at this time. She wants to cancel.

## 2020-11-23 NOTE — Telephone Encounter (Signed)
Primary Pulmonologist: Hunsucker Last office visit and with whom: 08/15/2020  Hunsucker What do we see them for (pulmonary problems):  Asthma Last OV assessment/plan:   Assessment & Plan:    # Mild persistent asthma with superimposed panic attack disorder # Panic attacks worsened by frequent albuterol and prednisone Rx; using xanax has prevented her from needing these meds, using 1/4 tab of 0.25 mg tablet twice a day, cutting down # Cough, seasonal allergies: Suspect intertwined   Discussion: - Continue symbicort 80/4.5 BID and albuterol PRN - Add Azelastine 1 spray BID -Plan increase symbicort to high dose if cough not improving in coming weeks -TSH, free T4 -Continue low dose xanax for now, will discuss with PCP in terms of taking over prescibing         Current Outpatient Medications:    acetaminophen (TYLENOL) 500 MG tablet, Take by mouth as needed., Disp: , Rfl:    ALPRAZolam (XANAX) 0.25 MG tablet, TAKE 1 TABLET BY MOUTH TWO TIMES DAILY AS NEEDED FOR ANXIETY, Disp: 60 tablet, Rfl: 0   amLODipine (NORVASC) 5 MG tablet, TAKE 1 TABLET BY MOUTH ONCE DAILY, Disp: 90 tablet, Rfl: 1   azelastine (ASTELIN) 0.1 % nasal spray, Place 1 spray into both nostrils 2 (two) times daily. Use in each nostril as directed, Disp: 30 mL, Rfl: 12   busPIRone (BUSPAR) 15 MG tablet, Take 0.5 tablets (7.5 mg total) by mouth 2 (two) times daily. Patient reports shes taking 1/2 tablet BID (Patient taking differently: Take 7.5 mg by mouth 2 (two) times daily. Patient reports shes taking 1/4 tablet BID), Disp: 90 tablet, Rfl: 1   cetirizine (ZYRTEC) 10 MG tablet, Take 10 mg by mouth daily. , Disp: , Rfl:    chlorthalidone (HYGROTON) 25 MG tablet, TAKE 1/2 TABLET BY MOUTH DAILY, Disp: 45 tablet, Rfl: 9   Cholecalciferol (VITAMIN D3) 5000 units TABS, 5,000 IU OTC vitamin D3 daily., Disp: 90 tablet, Rfl: 3   Cyanocobalamin (VITAMIN B-12) 2500 MCG TABS, Take 1,250 mcg by mouth daily., Disp: , Rfl:    DULoxetine  (CYMBALTA) 30 MG capsule, Take 1 capsule (30 mg total) by mouth daily., Disp: 90 capsule, Rfl: 0   ferrous sulfate (FERROUSUL) 325 (65 FE) MG tablet, Take 1 tablet (325 mg total) by mouth 3 (three) times daily with meals. (Patient taking differently: Take 325 mg by mouth daily.), Disp: , Rfl:    fluticasone (FLONASE) 50 MCG/ACT nasal spray, USE 2 SPRAYS INTO EACH NOSTRIL EVERY DAY, Disp: 48 g, Rfl: 3   losartan (COZAAR) 100 MG tablet, TAKE 1 TABLET BY MOUTH DAILY, Disp: 90 tablet, Rfl: 1   methocarbamol (ROBAXIN) 500 MG tablet, as needed., Disp: , Rfl:    montelukast (SINGULAIR) 10 MG tablet, TAKE 1 TABLET BY MOUTH ONCE DAILY AT BEDTIME, Disp: 90 tablet, Rfl: 1   omeprazole (PRILOSEC) 20 MG capsule, TAKE 1 CAPSULE BY MOUTH DAILY, Disp: 90 capsule, Rfl: 1   SYMBICORT 80-4.5 MCG/ACT inhaler, INHALE 2 PUFFS BY MOUTH INTO THE LUNGS TWICE A DAY, Disp: 10.2 g, Rfl: 5   traMADol (ULTRAM) 50 MG tablet, Take by mouth as needed., Disp: , Rfl:      Sonya Clam, MD [facility] 08/15/2020 1:40 PM         Patient Instructions by Sonya Clam, MD at 08/15/2020 12:00 PM  Author: Lanier Clam, MD Author Type: Physician Filed: 08/15/2020 12:12 PM  Note Status: Addendum Cosign: Cosign Not Required Encounter Date: 08/15/2020  Editor: Silas Flood,  Bonna Gains, MD (Physician)      Prior Versions: 1. Hunsucker, Bonna Gains, MD (Physician) at 08/15/2020 12:12 PM - Signed    Nice to see you   In addition to [facility] and your other pills for allergies, use azelastine spray 1 spray each nostril twice a day.  Hopefully this will help with the mucus production of the cough.  If things are not improving in the next 2 to 4 weeks, please call me and we will plan to increase the dose of your Symbicort.  I am hopeful symptoms will just gradually improve as the pollen lessens over the coming weeks.   Return to clinic in 6 months or sooner as needed       Orthostatic Vitals Recorded in  This Encounter   08/15/2020  1148     BP Location: Right Arm  Cuff Size: Normal   Instructions    Return in about 6 months (around 02/15/2021).  Nice to see you   In addition to [facility] and your other pills for allergies, use azelastine spray 1 spray each nostril twice a day.  Hopefully this will help with the mucus production of the cough.  If things are not improving in the next 2 to 4 weeks, please call me and we will plan to increase the dose of your Symbicort.  I am hopeful symptoms will just gradually improve as the pollen lessens over the coming weeks.   Return to clinic in 6 months or sooner as needed       Reason for call:  Last couple of days, tightness in chest.  Coughed all day yesterday.  She also has a raspy voice.  Feels weak and unstable today.  Has some mucous that comes up when she coughs that has no color and it does not come up every time.  Denies any fever, chills or body aches.  Using Albuterol inhaler at the most 4 times per day, gets relief and then it comes back in a short time.  She is sob at home with activity and at rest.  She walks every day, today she had to stop to rest.  No sick contacts.  She has had all of her covid vaccines and booster.  No recent covid testing.  Feels like this is a flare up of her asthma.  She is using her Asteilin nasal spray and her Symbicort as instructed.  She said she did use her Symbicort inhaler an additional time yesterday.  [name], Please advise.  Thank you.  (examples of things to ask: : When did symptoms start? Fever? Cough? Productive? Color to sputum? More sputum than usual? Wheezing? Have you needed increased oxygen? Are you taking your respiratory medications? What over the counter measures have you tried?)  Allergies  Allergen Reactions   Morphine    Morphine Sulfate Other (See Comments)    Makes her hyper    Immunization History  Administered Date(s) Administered   Fluad Quad(high Dose 65+) 12/16/2018    Influenza Split 01/03/2012   Influenza Whole 12/19/2008, 12/23/2009, 11/13/2010   Influenza, High Dose Seasonal PF 12/13/2013, 01/10/2015, 01/11/2016, 01/03/2017, 12/19/2017   Influenza,inj,Quad PF,6+ Mos 11/19/2012   Influenza,inj,quad, With Preservative 12/23/2016, 01/26/2018   Influenza-Unspecified 01/10/2015, 01/03/2017   PFIZER(Purple Top)SARS-COV-2 Vaccination 04/15/2019, 05/03/2019   Pneumococcal Conjugate-13 01/28/2013   Pneumococcal Polysaccharide-23 12/24/2007   Tdap 11/13/2010   Zoster Recombinat (Shingrix) 09/08/2018, 11/19/2018

## 2020-11-23 NOTE — Telephone Encounter (Signed)
I called and spoke with patient regarding [name] recs. Patient has just taken a covid test and it was positive. I informed patient that I will send this back to [facility] for further recs. I have scheduled her for a video visit at [place], please advise since patient has tested positive for Covid. Thanks!

## 2020-11-23 NOTE — Telephone Encounter (Signed)
Needs to do a Covid home test  Can see her today for 3pm video visit for further evaluation  Please contact office for sooner follow up if symptoms do not improve or worsen or seek emergency care

## 2020-11-23 NOTE — Telephone Encounter (Signed)
I called the patient to start her visit and she declined to have a  visit as she has called and talked to her PCP. She does not want a visit with [name] NP at this time. She reports she was given Paxlovid by her PCP.

## 2020-11-30 DIAGNOSIS — R058 Other specified cough: Secondary | ICD-10-CM | POA: Diagnosis not present

## 2020-11-30 DIAGNOSIS — U071 COVID-19: Secondary | ICD-10-CM | POA: Diagnosis not present

## 2020-12-13 DIAGNOSIS — M9905 Segmental and somatic dysfunction of pelvic region: Secondary | ICD-10-CM | POA: Diagnosis not present

## 2020-12-13 DIAGNOSIS — M9904 Segmental and somatic dysfunction of sacral region: Secondary | ICD-10-CM | POA: Diagnosis not present

## 2020-12-13 DIAGNOSIS — M9903 Segmental and somatic dysfunction of lumbar region: Secondary | ICD-10-CM | POA: Diagnosis not present

## 2020-12-13 DIAGNOSIS — M5136 Other intervertebral disc degeneration, lumbar region: Secondary | ICD-10-CM | POA: Diagnosis not present

## 2020-12-19 DIAGNOSIS — J4530 Mild persistent asthma, uncomplicated: Secondary | ICD-10-CM | POA: Diagnosis not present

## 2020-12-19 DIAGNOSIS — J069 Acute upper respiratory infection, unspecified: Secondary | ICD-10-CM | POA: Diagnosis not present

## 2020-12-19 DIAGNOSIS — R058 Other specified cough: Secondary | ICD-10-CM | POA: Diagnosis not present

## 2020-12-19 DIAGNOSIS — U071 COVID-19: Secondary | ICD-10-CM | POA: Diagnosis not present

## 2020-12-19 DIAGNOSIS — I129 Hypertensive chronic kidney disease with stage 1 through stage 4 chronic kidney disease, or unspecified chronic kidney disease: Secondary | ICD-10-CM | POA: Diagnosis not present

## 2020-12-20 DIAGNOSIS — M5136 Other intervertebral disc degeneration, lumbar region: Secondary | ICD-10-CM | POA: Diagnosis not present

## 2020-12-20 DIAGNOSIS — M9905 Segmental and somatic dysfunction of pelvic region: Secondary | ICD-10-CM | POA: Diagnosis not present

## 2020-12-20 DIAGNOSIS — M9903 Segmental and somatic dysfunction of lumbar region: Secondary | ICD-10-CM | POA: Diagnosis not present

## 2020-12-20 DIAGNOSIS — M9904 Segmental and somatic dysfunction of sacral region: Secondary | ICD-10-CM | POA: Diagnosis not present

## 2020-12-28 DIAGNOSIS — M5136 Other intervertebral disc degeneration, lumbar region: Secondary | ICD-10-CM | POA: Diagnosis not present

## 2020-12-28 DIAGNOSIS — M9901 Segmental and somatic dysfunction of cervical region: Secondary | ICD-10-CM | POA: Diagnosis not present

## 2020-12-28 DIAGNOSIS — M5031 Other cervical disc degeneration,  high cervical region: Secondary | ICD-10-CM | POA: Diagnosis not present

## 2020-12-28 DIAGNOSIS — M9903 Segmental and somatic dysfunction of lumbar region: Secondary | ICD-10-CM | POA: Diagnosis not present

## 2021-01-04 DIAGNOSIS — M7062 Trochanteric bursitis, left hip: Secondary | ICD-10-CM | POA: Diagnosis not present

## 2021-01-04 DIAGNOSIS — M7061 Trochanteric bursitis, right hip: Secondary | ICD-10-CM | POA: Diagnosis not present

## 2021-01-09 DIAGNOSIS — Z961 Presence of intraocular lens: Secondary | ICD-10-CM | POA: Diagnosis not present

## 2021-01-09 DIAGNOSIS — H04123 Dry eye syndrome of bilateral lacrimal glands: Secondary | ICD-10-CM | POA: Diagnosis not present

## 2021-01-09 DIAGNOSIS — H35372 Puckering of macula, left eye: Secondary | ICD-10-CM | POA: Diagnosis not present

## 2021-01-09 DIAGNOSIS — H40013 Open angle with borderline findings, low risk, bilateral: Secondary | ICD-10-CM | POA: Diagnosis not present

## 2021-01-11 DIAGNOSIS — L821 Other seborrheic keratosis: Secondary | ICD-10-CM | POA: Diagnosis not present

## 2021-01-11 DIAGNOSIS — L565 Disseminated superficial actinic porokeratosis (DSAP): Secondary | ICD-10-CM | POA: Diagnosis not present

## 2021-01-11 DIAGNOSIS — L218 Other seborrheic dermatitis: Secondary | ICD-10-CM | POA: Diagnosis not present

## 2021-01-11 DIAGNOSIS — D1801 Hemangioma of skin and subcutaneous tissue: Secondary | ICD-10-CM | POA: Diagnosis not present

## 2021-01-11 DIAGNOSIS — L812 Freckles: Secondary | ICD-10-CM | POA: Diagnosis not present

## 2021-01-11 DIAGNOSIS — L80 Vitiligo: Secondary | ICD-10-CM | POA: Diagnosis not present

## 2021-01-18 DIAGNOSIS — M9901 Segmental and somatic dysfunction of cervical region: Secondary | ICD-10-CM | POA: Diagnosis not present

## 2021-01-18 DIAGNOSIS — Z23 Encounter for immunization: Secondary | ICD-10-CM | POA: Diagnosis not present

## 2021-01-18 DIAGNOSIS — M5031 Other cervical disc degeneration,  high cervical region: Secondary | ICD-10-CM | POA: Diagnosis not present

## 2021-01-18 DIAGNOSIS — M9903 Segmental and somatic dysfunction of lumbar region: Secondary | ICD-10-CM | POA: Diagnosis not present

## 2021-01-18 DIAGNOSIS — M5136 Other intervertebral disc degeneration, lumbar region: Secondary | ICD-10-CM | POA: Diagnosis not present

## 2021-01-25 DIAGNOSIS — M9901 Segmental and somatic dysfunction of cervical region: Secondary | ICD-10-CM | POA: Diagnosis not present

## 2021-01-25 DIAGNOSIS — M9903 Segmental and somatic dysfunction of lumbar region: Secondary | ICD-10-CM | POA: Diagnosis not present

## 2021-01-25 DIAGNOSIS — M5031 Other cervical disc degeneration,  high cervical region: Secondary | ICD-10-CM | POA: Diagnosis not present

## 2021-01-25 DIAGNOSIS — M5136 Other intervertebral disc degeneration, lumbar region: Secondary | ICD-10-CM | POA: Diagnosis not present

## 2021-01-26 ENCOUNTER — Other Ambulatory Visit: Payer: Self-pay | Admitting: Pulmonary Disease

## 2021-01-31 ENCOUNTER — Other Ambulatory Visit: Payer: Self-pay | Admitting: Pulmonary Disease

## 2021-01-31 MED ORDER — ALPRAZOLAM 0.25 MG PO TABS: 0.2500 mg | ORAL_TABLET | Freq: Two times a day (BID) | ORAL | 0 refills | Status: DC | PRN

## 2021-01-31 NOTE — Telephone Encounter (Signed)
MH please advise on the refill of the xanax.  It looks like it was refilled on 11/7 but the rx was printed instead of being sent to the pharmacy.  I have pended the rx.  thanks

## 2021-02-06 DIAGNOSIS — M533 Sacrococcygeal disorders, not elsewhere classified: Secondary | ICD-10-CM | POA: Diagnosis not present

## 2021-02-14 DIAGNOSIS — M533 Sacrococcygeal disorders, not elsewhere classified: Secondary | ICD-10-CM | POA: Diagnosis not present

## 2021-02-22 DIAGNOSIS — M5031 Other cervical disc degeneration,  high cervical region: Secondary | ICD-10-CM | POA: Diagnosis not present

## 2021-02-22 DIAGNOSIS — M9901 Segmental and somatic dysfunction of cervical region: Secondary | ICD-10-CM | POA: Diagnosis not present

## 2021-02-22 DIAGNOSIS — M5136 Other intervertebral disc degeneration, lumbar region: Secondary | ICD-10-CM | POA: Diagnosis not present

## 2021-02-22 DIAGNOSIS — M9903 Segmental and somatic dysfunction of lumbar region: Secondary | ICD-10-CM | POA: Diagnosis not present

## 2021-02-26 ENCOUNTER — Other Ambulatory Visit: Payer: Self-pay | Admitting: Cardiovascular Disease

## 2021-03-01 ENCOUNTER — Other Ambulatory Visit: Payer: Self-pay | Admitting: Pulmonary Disease

## 2021-03-01 DIAGNOSIS — M9901 Segmental and somatic dysfunction of cervical region: Secondary | ICD-10-CM | POA: Diagnosis not present

## 2021-03-01 DIAGNOSIS — M5031 Other cervical disc degeneration,  high cervical region: Secondary | ICD-10-CM | POA: Diagnosis not present

## 2021-03-01 DIAGNOSIS — M9903 Segmental and somatic dysfunction of lumbar region: Secondary | ICD-10-CM | POA: Diagnosis not present

## 2021-03-01 DIAGNOSIS — M5136 Other intervertebral disc degeneration, lumbar region: Secondary | ICD-10-CM | POA: Diagnosis not present

## 2021-03-01 NOTE — Telephone Encounter (Signed)
Received a refill request for alprazolam 0.25mg . RX was last refilled on 01/29/21 for 60 tabs, no refills. Patient was last seen on 08/15/20 and was advised to follow up in 6 months. She was scheduled for a video visit with TP back in Sept but she cancelled at the last minute. There are no other F/U scheduled.   MH, are you ok with this refill? Please advise, thanks!

## 2021-03-01 NOTE — Telephone Encounter (Signed)
Needs follow up visit. Need to discuss anxiety regimen with myself and PCP as refills have become more frequent recently.

## 2021-03-07 DIAGNOSIS — M5136 Other intervertebral disc degeneration, lumbar region: Secondary | ICD-10-CM | POA: Diagnosis not present

## 2021-03-07 DIAGNOSIS — M9901 Segmental and somatic dysfunction of cervical region: Secondary | ICD-10-CM | POA: Diagnosis not present

## 2021-03-07 DIAGNOSIS — M9903 Segmental and somatic dysfunction of lumbar region: Secondary | ICD-10-CM | POA: Diagnosis not present

## 2021-03-07 DIAGNOSIS — M5031 Other cervical disc degeneration,  high cervical region: Secondary | ICD-10-CM | POA: Diagnosis not present

## 2021-03-12 ENCOUNTER — Other Ambulatory Visit: Payer: Self-pay | Admitting: Pulmonary Disease

## 2021-03-15 DIAGNOSIS — M5031 Other cervical disc degeneration,  high cervical region: Secondary | ICD-10-CM | POA: Diagnosis not present

## 2021-03-15 DIAGNOSIS — M9901 Segmental and somatic dysfunction of cervical region: Secondary | ICD-10-CM | POA: Diagnosis not present

## 2021-03-15 DIAGNOSIS — M5136 Other intervertebral disc degeneration, lumbar region: Secondary | ICD-10-CM | POA: Diagnosis not present

## 2021-03-15 DIAGNOSIS — M9903 Segmental and somatic dysfunction of lumbar region: Secondary | ICD-10-CM | POA: Diagnosis not present

## 2021-03-22 DIAGNOSIS — M9905 Segmental and somatic dysfunction of pelvic region: Secondary | ICD-10-CM | POA: Diagnosis not present

## 2021-03-22 DIAGNOSIS — R3914 Feeling of incomplete bladder emptying: Secondary | ICD-10-CM | POA: Diagnosis not present

## 2021-03-22 DIAGNOSIS — N3942 Incontinence without sensory awareness: Secondary | ICD-10-CM | POA: Diagnosis not present

## 2021-03-22 DIAGNOSIS — M9904 Segmental and somatic dysfunction of sacral region: Secondary | ICD-10-CM | POA: Diagnosis not present

## 2021-03-22 DIAGNOSIS — M5136 Other intervertebral disc degeneration, lumbar region: Secondary | ICD-10-CM | POA: Diagnosis not present

## 2021-03-22 DIAGNOSIS — R350 Frequency of micturition: Secondary | ICD-10-CM | POA: Diagnosis not present

## 2021-03-22 DIAGNOSIS — M9903 Segmental and somatic dysfunction of lumbar region: Secondary | ICD-10-CM | POA: Diagnosis not present

## 2021-03-29 DIAGNOSIS — M5136 Other intervertebral disc degeneration, lumbar region: Secondary | ICD-10-CM | POA: Diagnosis not present

## 2021-03-29 DIAGNOSIS — M9905 Segmental and somatic dysfunction of pelvic region: Secondary | ICD-10-CM | POA: Diagnosis not present

## 2021-03-29 DIAGNOSIS — M9903 Segmental and somatic dysfunction of lumbar region: Secondary | ICD-10-CM | POA: Diagnosis not present

## 2021-03-29 DIAGNOSIS — M9904 Segmental and somatic dysfunction of sacral region: Secondary | ICD-10-CM | POA: Diagnosis not present

## 2021-03-30 DIAGNOSIS — Z23 Encounter for immunization: Secondary | ICD-10-CM | POA: Diagnosis not present

## 2021-04-04 DIAGNOSIS — M9905 Segmental and somatic dysfunction of pelvic region: Secondary | ICD-10-CM | POA: Diagnosis not present

## 2021-04-04 DIAGNOSIS — M9904 Segmental and somatic dysfunction of sacral region: Secondary | ICD-10-CM | POA: Diagnosis not present

## 2021-04-04 DIAGNOSIS — M5136 Other intervertebral disc degeneration, lumbar region: Secondary | ICD-10-CM | POA: Diagnosis not present

## 2021-04-04 DIAGNOSIS — M9903 Segmental and somatic dysfunction of lumbar region: Secondary | ICD-10-CM | POA: Diagnosis not present

## 2021-04-05 ENCOUNTER — Telehealth: Payer: Self-pay | Admitting: Pulmonary Disease

## 2021-04-05 NOTE — Telephone Encounter (Signed)
Please advise on refill.

## 2021-04-06 NOTE — Telephone Encounter (Signed)
Received request last month. I refused and requested a in person visit to assess symptoms. Can we arrange visit? Prefer she be seen as has been over six months and requesting controlled substance.

## 2021-04-06 NOTE — Telephone Encounter (Signed)
Attempted to call pt but unable to reach. Unable to leave VM due to no machine ever kicking in.  If pt calls back, please schedule pt an appt with Dr. Silas Flood next avail as she will not be able to have a refill of her Xanax until she is seen by Dr. Silas Flood for an appt.

## 2021-04-06 NOTE — Telephone Encounter (Signed)
Patient has been scheduled for OV with Dr. Silas Flood. Nothing further needed at this time.  Next Appt With Pulmonology Lanier Clam, MD) 04/24/2021 at 12:00 PM

## 2021-04-10 DIAGNOSIS — E785 Hyperlipidemia, unspecified: Secondary | ICD-10-CM | POA: Diagnosis not present

## 2021-04-10 DIAGNOSIS — E538 Deficiency of other specified B group vitamins: Secondary | ICD-10-CM | POA: Diagnosis not present

## 2021-04-10 DIAGNOSIS — Z87891 Personal history of nicotine dependence: Secondary | ICD-10-CM | POA: Diagnosis not present

## 2021-04-10 DIAGNOSIS — F339 Major depressive disorder, recurrent, unspecified: Secondary | ICD-10-CM | POA: Diagnosis not present

## 2021-04-10 DIAGNOSIS — F419 Anxiety disorder, unspecified: Secondary | ICD-10-CM | POA: Diagnosis not present

## 2021-04-10 DIAGNOSIS — J4530 Mild persistent asthma, uncomplicated: Secondary | ICD-10-CM | POA: Diagnosis not present

## 2021-04-10 DIAGNOSIS — G729 Myopathy, unspecified: Secondary | ICD-10-CM | POA: Diagnosis not present

## 2021-04-10 DIAGNOSIS — R7309 Other abnormal glucose: Secondary | ICD-10-CM | POA: Diagnosis not present

## 2021-04-10 DIAGNOSIS — I129 Hypertensive chronic kidney disease with stage 1 through stage 4 chronic kidney disease, or unspecified chronic kidney disease: Secondary | ICD-10-CM | POA: Diagnosis not present

## 2021-04-10 DIAGNOSIS — I700 Atherosclerosis of aorta: Secondary | ICD-10-CM | POA: Diagnosis not present

## 2021-04-12 DIAGNOSIS — M5136 Other intervertebral disc degeneration, lumbar region: Secondary | ICD-10-CM | POA: Diagnosis not present

## 2021-04-12 DIAGNOSIS — M9904 Segmental and somatic dysfunction of sacral region: Secondary | ICD-10-CM | POA: Diagnosis not present

## 2021-04-12 DIAGNOSIS — M9903 Segmental and somatic dysfunction of lumbar region: Secondary | ICD-10-CM | POA: Diagnosis not present

## 2021-04-12 DIAGNOSIS — M9905 Segmental and somatic dysfunction of pelvic region: Secondary | ICD-10-CM | POA: Diagnosis not present

## 2021-04-23 DIAGNOSIS — D72825 Bandemia: Secondary | ICD-10-CM | POA: Diagnosis not present

## 2021-04-23 DIAGNOSIS — M7989 Other specified soft tissue disorders: Secondary | ICD-10-CM | POA: Diagnosis not present

## 2021-04-23 DIAGNOSIS — I129 Hypertensive chronic kidney disease with stage 1 through stage 4 chronic kidney disease, or unspecified chronic kidney disease: Secondary | ICD-10-CM | POA: Diagnosis not present

## 2021-04-23 DIAGNOSIS — E871 Hypo-osmolality and hyponatremia: Secondary | ICD-10-CM | POA: Diagnosis not present

## 2021-04-24 ENCOUNTER — Ambulatory Visit: Payer: Medicare Other | Admitting: Pulmonary Disease

## 2021-04-24 DIAGNOSIS — M65831 Other synovitis and tenosynovitis, right forearm: Secondary | ICD-10-CM | POA: Diagnosis not present

## 2021-04-25 ENCOUNTER — Ambulatory Visit: Payer: Medicare Other | Admitting: Pulmonary Disease

## 2021-04-25 ENCOUNTER — Other Ambulatory Visit: Payer: Self-pay

## 2021-04-25 DIAGNOSIS — M65841 Other synovitis and tenosynovitis, right hand: Secondary | ICD-10-CM | POA: Diagnosis not present

## 2021-04-25 DIAGNOSIS — G8918 Other acute postprocedural pain: Secondary | ICD-10-CM | POA: Diagnosis not present

## 2021-04-25 DIAGNOSIS — E871 Hypo-osmolality and hyponatremia: Secondary | ICD-10-CM | POA: Diagnosis not present

## 2021-04-25 DIAGNOSIS — M009 Pyogenic arthritis, unspecified: Secondary | ICD-10-CM | POA: Diagnosis not present

## 2021-04-25 DIAGNOSIS — M659 Unspecified synovitis and tenosynovitis, unspecified site: Secondary | ICD-10-CM

## 2021-04-25 DIAGNOSIS — D72825 Bandemia: Secondary | ICD-10-CM | POA: Diagnosis not present

## 2021-04-25 DIAGNOSIS — I129 Hypertensive chronic kidney disease with stage 1 through stage 4 chronic kidney disease, or unspecified chronic kidney disease: Secondary | ICD-10-CM | POA: Diagnosis not present

## 2021-04-25 DIAGNOSIS — M7989 Other specified soft tissue disorders: Secondary | ICD-10-CM | POA: Diagnosis not present

## 2021-04-25 HISTORY — DX: Unspecified synovitis and tenosynovitis, unspecified site: M65.90

## 2021-04-26 ENCOUNTER — Telehealth: Payer: Self-pay | Admitting: Pulmonary Disease

## 2021-04-26 NOTE — Telephone Encounter (Signed)
Spoke to patient.  Patient is requesting a Rx for Xanax 0.25 to get her by until her appt on 05/06/21. Last refilled 01/31/2021 #60 with 0 refills.   Dr. Silas Flood, please advise. thanks

## 2021-04-30 DIAGNOSIS — M7989 Other specified soft tissue disorders: Secondary | ICD-10-CM | POA: Diagnosis not present

## 2021-04-30 DIAGNOSIS — M009 Pyogenic arthritis, unspecified: Secondary | ICD-10-CM | POA: Diagnosis not present

## 2021-04-30 DIAGNOSIS — Z7189 Other specified counseling: Secondary | ICD-10-CM | POA: Diagnosis not present

## 2021-04-30 DIAGNOSIS — E871 Hypo-osmolality and hyponatremia: Secondary | ICD-10-CM | POA: Diagnosis not present

## 2021-04-30 DIAGNOSIS — I129 Hypertensive chronic kidney disease with stage 1 through stage 4 chronic kidney disease, or unspecified chronic kidney disease: Secondary | ICD-10-CM | POA: Diagnosis not present

## 2021-04-30 DIAGNOSIS — D72825 Bandemia: Secondary | ICD-10-CM | POA: Diagnosis not present

## 2021-05-07 ENCOUNTER — Encounter: Payer: Self-pay | Admitting: Pulmonary Disease

## 2021-05-07 ENCOUNTER — Ambulatory Visit (INDEPENDENT_AMBULATORY_CARE_PROVIDER_SITE_OTHER): Payer: Medicare Other | Admitting: Pulmonary Disease

## 2021-05-07 ENCOUNTER — Other Ambulatory Visit: Payer: Self-pay

## 2021-05-07 VITALS — BP 152/70 | HR 85 | Temp 98.5°F | Ht 64.0 in | Wt 142.0 lb

## 2021-05-07 DIAGNOSIS — F410 Panic disorder [episodic paroxysmal anxiety] without agoraphobia: Secondary | ICD-10-CM

## 2021-05-07 DIAGNOSIS — J4520 Mild intermittent asthma, uncomplicated: Secondary | ICD-10-CM | POA: Diagnosis not present

## 2021-05-07 MED ORDER — ALPRAZOLAM 0.25 MG PO TABS: 0.1250 mg | ORAL_TABLET | Freq: Two times a day (BID) | ORAL | 0 refills | Status: DC | PRN

## 2021-05-07 NOTE — Patient Instructions (Signed)
Nice to see you  I refilled the xanax  Let me know if you feel like chest tightness, cough, or shortness of breath develops or worsens.  No changes to medications today.

## 2021-05-07 NOTE — Progress Notes (Addendum)
Synopsis: Longstanding patient of Dr. Lake Bells managed here for asthma  Subjective:   PATIENT ID: [name] GENDER: female DOB: 10/22/1933, MRN: [id number]  Chief Complaint  Patient presents with   Follow-up    Follow up for asthma. Pt states that she is unsure how she is doing since she has had her arm drained.     HPI Here for asthma, recurrent sinus congestion complicated by anxiety disorder. Regimen consists of symbicort 80/4.5 BID, singulair, omeprazole, flonase, zyrtec, PRN xanax.   Breathing feels fine.  Does get short of breath tachypneic.  Relieved with as needed anxiety.  More panic attacks and asthma symptoms.  Denies chest tightness or cough.  Undergoing serial evaluation for right hand staph infection.  Status post I&D.  Ortho note x3 reviewed.  Labs reviewed and notable for:  IgE 3 2017, 4 2009 Eos not elevated TSH WNL  ROS No orthopnea or PND. No chest pain. Comprehensive review of systems negative.   Objective:  GEN: elderly woman in NAD HEENT: MMM, trachea midline CV: RRR, ext warm PULM: clear in all lung fields, no accessory muscle use GI: Soft, +BS EXT: no edema PSYCH: AOx3, normal affect SKIN: No rashes MSK: Right forearm to wrist bandaged, swelling of right digits noted, prior incision with sutures in place appear clean dry and intact    Vitals:   05/07/21 1204  BP: (!) 152/70  Pulse: 85  Temp: 98.5 F (36.9 C)  TempSrc: Oral  SpO2: 96%  Weight: 142 lb (64.4 kg)  Height: 5\' 4"  (1.626 m)   96% on RA BMI Readings from Last 3 Encounters:  05/07/21 24.37 kg/m  09/19/20 22.80 kg/m  08/15/20 22.96 kg/m   Wt Readings from Last 3 Encounters:  05/07/21 142 lb (64.4 kg)  09/19/20 137 lb (62.1 kg)  08/15/20 138 lb (62.6 kg)     CBC    Component Value Date/Time   WBC 7.6 03/29/2019 1046   WBC 7.0 10/01/2017 1114   RBC 4.67 03/29/2019 1046   RBC 4.61 10/01/2017 1114   HGB 14.4 03/29/2019 1046   HGB 12.6 05/20/2011 1331   HCT  42.3 03/29/2019 1046   HCT 37.5 05/20/2011 1331   PLT 232 03/29/2019 1046   MCV 91 03/29/2019 1046   MCV 93.7 05/20/2011 1331   MCH 30.8 03/29/2019 1046   MCH 29.9 10/01/2017 1114   MCHC 34.0 03/29/2019 1046   MCHC 33.6 10/01/2017 1114   RDW 12.4 03/29/2019 1046   RDW 15.3 (H) 05/20/2011 1331   LYMPHSABS 1.2 03/29/2019 1046   LYMPHSABS 1.3 05/20/2011 1331   MONOABS 1.2 (H) 09/07/2016 1725   MONOABS 0.4 05/20/2011 1331   EOSABS 0.1 03/29/2019 1046   BASOSABS 0.0 03/29/2019 1046   BASOSABS 0.0 05/20/2011 1331    Chest Imaging: CXR 2019 - clear lungs, mild hyperinflation on my interpretation   Pulmonary Functions Testing Results: PFT Results Latest Ref Rng & Units 08/18/2015  FVC-Pre L 2.43  FVC-Predicted Pre % 98  FVC-Post L 2.45  FVC-Predicted Post % 98  Pre FEV1/FVC % % 84  Post FEV1/FCV % % 86  FEV1-Pre L 2.05  FEV1-Predicted Pre % 111  FEV1-Post L 2.10  DLCO uncorrected ml/min/mmHg 17.47  DLCO UNC% % 76  DLCO corrected ml/min/mmHg 17.02  DLCO COR %Predicted % 74  DLVA Predicted % 88  TLC L 4.57  TLC % Predicted % 93  RV % Predicted % 92  Interpretation: 2017 WNL, moderate fixed obstruction in 2019  Echo  diastolic dysfunction 7622 Nuclear stress WNL 2019   Assessment & Plan:   # Mild persistent asthma with superimposed panic attack disorder # Panic attacks worsened by frequent albuterol and prednisone Rx; using xanax has prevented her from needing these meds, using 1/2 tab of 0.25 mg tablet twice a day at most, usually daily  Discussion: - Continue symbicort 80/4.5 BID and albuterol PRN -Continue low dose xanax for now, PCP recently increased SNRI     Current Outpatient Medications:    acetaminophen (TYLENOL) 500 MG tablet, Take by mouth as needed., Disp: , Rfl:    ALPRAZolam (XANAX) 0.25 MG tablet, Take 1 tablet (0.25 mg total) by mouth 2 (two) times daily as needed for anxiety., Disp: 60 tablet, Rfl: 0   amLODipine (NORVASC) 5 MG tablet, TAKE 1 TABLET  BY MOUTH ONCE DAILY, Disp: 90 tablet, Rfl: 3   amoxicillin (AMOXIL) 500 MG capsule, Take 1,000 mg by mouth 2 (two) times daily., Disp: , Rfl:    azelastine (ASTELIN) 0.1 % nasal spray, Place 1 spray into both nostrils 2 (two) times daily. Use in each nostril as directed, Disp: 30 mL, Rfl: 12   cetirizine (ZYRTEC) 10 MG tablet, Take 10 mg by mouth daily. , Disp: , Rfl:    Cholecalciferol (VITAMIN D3) 5000 units TABS, 5,000 IU OTC vitamin D3 daily., Disp: 90 tablet, Rfl: 3   Cyanocobalamin (VITAMIN B-12) 2500 MCG TABS, Take 1,250 mcg by mouth daily., Disp: , Rfl:    DULoxetine (CYMBALTA) 30 MG capsule, Take 1 capsule (30 mg total) by mouth daily. (Patient taking differently: Take 60 mg by mouth daily.), Disp: 90 capsule, Rfl: 0   ferrous sulfate (FERROUSUL) 325 (65 FE) MG tablet, Take 1 tablet (325 mg total) by mouth 3 (three) times daily with meals. (Patient taking differently: Take 325 mg by mouth daily.), Disp: , Rfl:    fluticasone (FLONASE) 50 MCG/ACT nasal spray, USE 2 SPRAYS INTO EACH NOSTRIL EVERY DAY, Disp: 48 g, Rfl: 3   losartan (COZAAR) 100 MG tablet, TAKE 1 TABLET BY MOUTH DAILY, Disp: 90 tablet, Rfl: 1   montelukast (SINGULAIR) 10 MG tablet, TAKE 1 TABLET BY MOUTH ONCE DAILY AT BEDTIME, Disp: 90 tablet, Rfl: 1   omeprazole (PRILOSEC) 20 MG capsule, TAKE 1 CAPSULE BY MOUTH DAILY, Disp: 90 capsule, Rfl: 1   SYMBICORT 80-4.5 MCG/ACT inhaler, INHALE 2 PUFFS BY MOUTH INTO THE LUNGS TWICE A DAY, Disp: 10.2 g, Rfl: 5   Lanier Clam, MD [facility] 05/07/2021 12:12 PM

## 2021-05-09 DIAGNOSIS — M9902 Segmental and somatic dysfunction of thoracic region: Secondary | ICD-10-CM | POA: Diagnosis not present

## 2021-05-09 DIAGNOSIS — M5134 Other intervertebral disc degeneration, thoracic region: Secondary | ICD-10-CM | POA: Diagnosis not present

## 2021-05-11 NOTE — Telephone Encounter (Signed)
Pt's xanax was refilled. Nothing further needed.

## 2021-05-24 DIAGNOSIS — R3914 Feeling of incomplete bladder emptying: Secondary | ICD-10-CM | POA: Diagnosis not present

## 2021-05-24 DIAGNOSIS — M9902 Segmental and somatic dysfunction of thoracic region: Secondary | ICD-10-CM | POA: Diagnosis not present

## 2021-05-24 DIAGNOSIS — M5134 Other intervertebral disc degeneration, thoracic region: Secondary | ICD-10-CM | POA: Diagnosis not present

## 2021-05-24 DIAGNOSIS — N139 Obstructive and reflux uropathy, unspecified: Secondary | ICD-10-CM | POA: Diagnosis not present

## 2021-05-28 ENCOUNTER — Other Ambulatory Visit: Payer: Self-pay | Admitting: Pulmonary Disease

## 2021-05-28 ENCOUNTER — Other Ambulatory Visit: Payer: Self-pay | Admitting: Internal Medicine

## 2021-05-31 DIAGNOSIS — R5383 Other fatigue: Secondary | ICD-10-CM | POA: Diagnosis not present

## 2021-05-31 DIAGNOSIS — N3946 Mixed incontinence: Secondary | ICD-10-CM | POA: Diagnosis not present

## 2021-05-31 DIAGNOSIS — E559 Vitamin D deficiency, unspecified: Secondary | ICD-10-CM | POA: Diagnosis not present

## 2021-05-31 DIAGNOSIS — R7303 Prediabetes: Secondary | ICD-10-CM | POA: Diagnosis not present

## 2021-05-31 DIAGNOSIS — F339 Major depressive disorder, recurrent, unspecified: Secondary | ICD-10-CM | POA: Diagnosis not present

## 2021-05-31 DIAGNOSIS — E871 Hypo-osmolality and hyponatremia: Secondary | ICD-10-CM | POA: Diagnosis not present

## 2021-05-31 DIAGNOSIS — M797 Fibromyalgia: Secondary | ICD-10-CM | POA: Diagnosis not present

## 2021-05-31 DIAGNOSIS — E538 Deficiency of other specified B group vitamins: Secondary | ICD-10-CM | POA: Diagnosis not present

## 2021-05-31 DIAGNOSIS — I129 Hypertensive chronic kidney disease with stage 1 through stage 4 chronic kidney disease, or unspecified chronic kidney disease: Secondary | ICD-10-CM | POA: Diagnosis not present

## 2021-05-31 DIAGNOSIS — J4530 Mild persistent asthma, uncomplicated: Secondary | ICD-10-CM | POA: Diagnosis not present

## 2021-05-31 DIAGNOSIS — D509 Iron deficiency anemia, unspecified: Secondary | ICD-10-CM | POA: Diagnosis not present

## 2021-05-31 DIAGNOSIS — G729 Myopathy, unspecified: Secondary | ICD-10-CM | POA: Diagnosis not present

## 2021-06-01 ENCOUNTER — Telehealth: Payer: Self-pay | Admitting: Pulmonary Disease

## 2021-06-01 ENCOUNTER — Other Ambulatory Visit (HOSPITAL_COMMUNITY): Payer: Self-pay

## 2021-06-01 NOTE — Telephone Encounter (Signed)
The cheapest is Advair Diskus or HFA.  Copay is $150.    ? ?Or patient assistance for Symbicort  ? ?Please advise Dr Silas Flood  ?

## 2021-06-01 NOTE — Telephone Encounter (Signed)
Can we run a test claim on her symbicort and also see what else is a available for this patient? ? ?Thank you  ?

## 2021-06-01 NOTE — Telephone Encounter (Signed)
Called patient and told her that [facility] wants her to fill out patient assistance paperwork and for her to come in Monday to pick up the paperwork at the front desk. Patient verbalized understanding and will pick up paperwork Monday. Nothing further  ?

## 2021-06-01 NOTE — Telephone Encounter (Signed)
Would advise patient assistance forms - that copay seems too high.

## 2021-06-07 DIAGNOSIS — M5134 Other intervertebral disc degeneration, thoracic region: Secondary | ICD-10-CM | POA: Diagnosis not present

## 2021-06-07 DIAGNOSIS — M9902 Segmental and somatic dysfunction of thoracic region: Secondary | ICD-10-CM | POA: Diagnosis not present

## 2021-06-21 DIAGNOSIS — M5134 Other intervertebral disc degeneration, thoracic region: Secondary | ICD-10-CM | POA: Diagnosis not present

## 2021-06-21 DIAGNOSIS — M9902 Segmental and somatic dysfunction of thoracic region: Secondary | ICD-10-CM | POA: Diagnosis not present

## 2021-07-05 DIAGNOSIS — K5900 Constipation, unspecified: Secondary | ICD-10-CM | POA: Diagnosis not present

## 2021-07-05 DIAGNOSIS — M9902 Segmental and somatic dysfunction of thoracic region: Secondary | ICD-10-CM | POA: Diagnosis not present

## 2021-07-05 DIAGNOSIS — D509 Iron deficiency anemia, unspecified: Secondary | ICD-10-CM | POA: Diagnosis not present

## 2021-07-05 DIAGNOSIS — M5134 Other intervertebral disc degeneration, thoracic region: Secondary | ICD-10-CM | POA: Diagnosis not present

## 2021-07-12 DIAGNOSIS — M9904 Segmental and somatic dysfunction of sacral region: Secondary | ICD-10-CM | POA: Diagnosis not present

## 2021-07-12 DIAGNOSIS — M5136 Other intervertebral disc degeneration, lumbar region: Secondary | ICD-10-CM | POA: Diagnosis not present

## 2021-07-12 DIAGNOSIS — M9903 Segmental and somatic dysfunction of lumbar region: Secondary | ICD-10-CM | POA: Diagnosis not present

## 2021-07-12 DIAGNOSIS — M9905 Segmental and somatic dysfunction of pelvic region: Secondary | ICD-10-CM | POA: Diagnosis not present

## 2021-07-13 ENCOUNTER — Other Ambulatory Visit: Payer: Self-pay | Admitting: Pulmonary Disease

## 2021-07-13 DIAGNOSIS — F410 Panic disorder [episodic paroxysmal anxiety] without agoraphobia: Secondary | ICD-10-CM

## 2021-07-16 DIAGNOSIS — H40013 Open angle with borderline findings, low risk, bilateral: Secondary | ICD-10-CM | POA: Diagnosis not present

## 2021-07-16 DIAGNOSIS — Z961 Presence of intraocular lens: Secondary | ICD-10-CM | POA: Diagnosis not present

## 2021-07-16 DIAGNOSIS — H04123 Dry eye syndrome of bilateral lacrimal glands: Secondary | ICD-10-CM | POA: Diagnosis not present

## 2021-07-16 DIAGNOSIS — H35372 Puckering of macula, left eye: Secondary | ICD-10-CM | POA: Diagnosis not present

## 2021-07-19 DIAGNOSIS — M9903 Segmental and somatic dysfunction of lumbar region: Secondary | ICD-10-CM | POA: Diagnosis not present

## 2021-07-19 DIAGNOSIS — M5136 Other intervertebral disc degeneration, lumbar region: Secondary | ICD-10-CM | POA: Diagnosis not present

## 2021-07-19 DIAGNOSIS — M9905 Segmental and somatic dysfunction of pelvic region: Secondary | ICD-10-CM | POA: Diagnosis not present

## 2021-07-19 DIAGNOSIS — M9904 Segmental and somatic dysfunction of sacral region: Secondary | ICD-10-CM | POA: Diagnosis not present

## 2021-07-23 DIAGNOSIS — M5136 Other intervertebral disc degeneration, lumbar region: Secondary | ICD-10-CM | POA: Diagnosis not present

## 2021-07-26 DIAGNOSIS — M9904 Segmental and somatic dysfunction of sacral region: Secondary | ICD-10-CM | POA: Diagnosis not present

## 2021-07-26 DIAGNOSIS — M5136 Other intervertebral disc degeneration, lumbar region: Secondary | ICD-10-CM | POA: Diagnosis not present

## 2021-07-26 DIAGNOSIS — M9903 Segmental and somatic dysfunction of lumbar region: Secondary | ICD-10-CM | POA: Diagnosis not present

## 2021-07-26 DIAGNOSIS — M9905 Segmental and somatic dysfunction of pelvic region: Secondary | ICD-10-CM | POA: Diagnosis not present

## 2021-08-02 DIAGNOSIS — M9904 Segmental and somatic dysfunction of sacral region: Secondary | ICD-10-CM | POA: Diagnosis not present

## 2021-08-02 DIAGNOSIS — M9905 Segmental and somatic dysfunction of pelvic region: Secondary | ICD-10-CM | POA: Diagnosis not present

## 2021-08-02 DIAGNOSIS — M9903 Segmental and somatic dysfunction of lumbar region: Secondary | ICD-10-CM | POA: Diagnosis not present

## 2021-08-02 DIAGNOSIS — M5136 Other intervertebral disc degeneration, lumbar region: Secondary | ICD-10-CM | POA: Diagnosis not present

## 2021-08-03 DIAGNOSIS — M25512 Pain in left shoulder: Secondary | ICD-10-CM | POA: Insufficient documentation

## 2021-08-03 HISTORY — DX: Pain in left shoulder: M25.512

## 2021-08-06 ENCOUNTER — Encounter: Payer: Self-pay | Admitting: Pulmonary Disease

## 2021-08-06 ENCOUNTER — Ambulatory Visit (INDEPENDENT_AMBULATORY_CARE_PROVIDER_SITE_OTHER): Payer: Medicare Other | Admitting: Pulmonary Disease

## 2021-08-06 VITALS — BP 124/64 | HR 84 | Temp 98.1°F | Ht 64.0 in | Wt 124.4 lb

## 2021-08-06 DIAGNOSIS — J4531 Mild persistent asthma with (acute) exacerbation: Secondary | ICD-10-CM | POA: Diagnosis not present

## 2021-08-06 DIAGNOSIS — M19012 Primary osteoarthritis, left shoulder: Secondary | ICD-10-CM | POA: Diagnosis not present

## 2021-08-06 DIAGNOSIS — F419 Anxiety disorder, unspecified: Secondary | ICD-10-CM

## 2021-08-06 DIAGNOSIS — M25512 Pain in left shoulder: Secondary | ICD-10-CM | POA: Diagnosis not present

## 2021-08-06 NOTE — Progress Notes (Signed)
? ?Synopsis: Longstanding patient of Dr. Lake Bells managed here for asthma ? ?Subjective:  ? ?PATIENT ID: [name] GENDER: female DOB: 1934-02-28, MRN: [id number] ? ?Chief Complaint  ?Patient presents with  ? Follow-up  ?  Pt states she is doing well with her asthma since last visit. Pt is on Symbicort daily and Albuterol as needed. ACT is 15   ? ? ?HPI ?Here for asthma, recurrent sinus congestion complicated by anxiety disorder. Regimen consists of symbicort 80/4.5 BID, singulair, omeprazole, flonase, zyrtec, PRN xanax.  ? ?Breathing feels fine.  No real changes.  Endorses ongoing positive response to Symbicort therapy.  Anxiety seems similar to prior.  Continues as needed Xanax which has helped mitigate asthma "exacerbations" over the last couple of years.  She endorses minimal energy.  She walks every day, exercises.  No issues with that.  Just feels tired.  Like she could fall asleep during the day.  But never does.  She endorses sleeping up to 7 hours most mornings before being awoken by the need to urinate. ? ?Labs reviewed and notable for:  ?IgE 3 2017, 4 2009 ?Eos not elevated ?TSH WNL ? ?ROS ?No orthopnea or PND. No chest pain. Comprehensive review of systems negative. ? ? ?Objective:  ?GEN: elderly woman in NAD ?HEENT: MMM, trachea midline ?CV: RRR, ext warm ?PULM: clear in all lung fields, no accessory muscle use ?GI: Soft, +BS ?EXT: no edema ?PSYCH: AOx3, normal affect ?SKIN: No rashes ?MSK: Right knuckle digits 3 through 5 slightly swollen with well-healed incision over the third knuckle ? ? ? ?Vitals:  ? 08/06/21 1102  ?BP: 124/64  ?Pulse: 84  ?Temp: 98.1 ?F (36.7 ?C)  ?TempSrc: Oral  ?SpO2: 100%  ?Weight: 124 lb 6.4 oz (56.4 kg)  ?Height: '5\' 4"'$  (1.626 m)  ? ?100% on RA ?BMI Readings from Last 3 Encounters:  ?08/06/21 21.35 kg/m?  ?05/07/21 24.37 kg/m?  ?09/19/20 22.80 kg/m?  ? ?Wt Readings from Last 3 Encounters:  ?08/06/21 124 lb 6.4 oz (56.4 kg)  ?05/07/21 142 lb (64.4 kg)  ?09/19/20 137 lb  (62.1 kg)  ? ? ? ?CBC ?   ?Component Value Date/Time  ? WBC 7.6 03/29/2019 1046  ? WBC 7.0 10/01/2017 1114  ? RBC 4.67 03/29/2019 1046  ? RBC 4.61 10/01/2017 1114  ? HGB 14.4 03/29/2019 1046  ? HGB 12.6 05/20/2011 1331  ? HCT 42.3 03/29/2019 1046  ? HCT 37.5 05/20/2011 1331  ? PLT 232 03/29/2019 1046  ? MCV 91 03/29/2019 1046  ? MCV 93.7 05/20/2011 1331  ? MCH 30.8 03/29/2019 1046  ? MCH 29.9 10/01/2017 1114  ? MCHC 34.0 03/29/2019 1046  ? MCHC 33.6 10/01/2017 1114  ? RDW 12.4 03/29/2019 1046  ? RDW 15.3 (H) 05/20/2011 1331  ? LYMPHSABS 1.2 03/29/2019 1046  ? LYMPHSABS 1.3 05/20/2011 1331  ? MONOABS 1.2 (H) 09/07/2016 1725  ? MONOABS 0.4 05/20/2011 1331  ? EOSABS 0.1 03/29/2019 1046  ? BASOSABS 0.0 03/29/2019 1046  ? BASOSABS 0.0 05/20/2011 1331  ? ? ?Chest Imaging: ?CXR 2019 - clear lungs, mild hyperinflation on my interpretation  ? ?Pulmonary Functions Testing Results: ? ?  Latest Ref Rng & Units 08/18/2015  ?  9:00 AM  ?PFT Results  ?FVC-Pre L 2.43  P  ?FVC-Predicted Pre % 98  P  ?FVC-Post L 2.45  P  ?FVC-Predicted Post % 98  P  ?Pre FEV1/FVC % % 84  P  ?Post FEV1/FCV % % 86  P  ?FEV1-Pre L  2.05  P  ?FEV1-Predicted Pre % 111  P  ?FEV1-Post L 2.10  P  ?DLCO uncorrected ml/min/mmHg 17.47  P  ?DLCO UNC% % 76  P  ?DLCO corrected ml/min/mmHg 17.02  P  ?DLCO COR %Predicted % 74  P  ?DLVA Predicted % 88  P  ?TLC L 4.57  P  ?TLC % Predicted % 93  P  ?RV % Predicted % 92  P  ?  ?P Preliminary result  ?Interpretation: 2017 WNL, moderate fixed obstruction in 2019  ?Echo diastolic dysfunction 0355 ?Nuclear stress WNL 2019 ? ? ?Assessment & Plan:  ? ?# Mild persistent asthma with superimposed panic attack disorder ?# Panic attacks worsened by frequent albuterol and prednisone Rx; using xanax has prevented her from needing these meds, using 1/2 tab of 0.25 mg tablet twice a day at most, usually daily ? ?Discussion: ?- Continue symbicort 80/4.5 BID and albuterol PRN, if cost continues to be a issue, will need to explore more  cost effective ICS/LABA therapy ?-Continue low dose xanax  ? ? ? ? ?Current Outpatient Medications:  ?  acetaminophen (TYLENOL) 500 MG tablet, Take by mouth as needed., Disp: , Rfl:  ?  albuterol (VENTOLIN HFA) 108 (90 Base) MCG/ACT inhaler, INHALE 2 PUFFS INTO THE LUNGS EVERY 6 HOURS AS NEEDED FOR WHEEZING OR SHORTNESS OF BREATH, Disp: 8.5 g, Rfl: 3 ?  ALPRAZolam (XANAX) 0.25 MG tablet, TAKE 1/2 TABLET BY MOUTH TWO TIMES DAILYAS NEEDED FOR ANXIETY, Disp: 90 tablet, Rfl: 0 ?  amLODipine (NORVASC) 5 MG tablet, TAKE 1 TABLET BY MOUTH ONCE DAILY, Disp: 90 tablet, Rfl: 3 ?  azelastine (ASTELIN) 0.1 % nasal spray, Place 1 spray into both nostrils 2 (two) times daily. Use in each nostril as directed, Disp: 30 mL, Rfl: 12 ?  cetirizine (ZYRTEC) 10 MG tablet, Take 10 mg by mouth daily. , Disp: , Rfl:  ?  Cholecalciferol (VITAMIN D3) 5000 units TABS, 5,000 IU OTC vitamin D3 daily., Disp: 90 tablet, Rfl: 3 ?  Cyanocobalamin (VITAMIN B-12) 2500 MCG TABS, Take 1,250 mcg by mouth daily., Disp: , Rfl:  ?  DULoxetine (CYMBALTA) 30 MG capsule, Take 1 capsule (30 mg total) by mouth daily. (Patient taking differently: Take 60 mg by mouth daily.), Disp: 90 capsule, Rfl: 0 ?  fluticasone (FLONASE) 50 MCG/ACT nasal spray, USE 2 SPRAYS INTO EACH NOSTRIL EVERY DAY, Disp: 48 g, Rfl: 3 ?  losartan (COZAAR) 100 MG tablet, TAKE 1 TABLET BY MOUTH DAILY, Disp: 90 tablet, Rfl: 1 ?  montelukast (SINGULAIR) 10 MG tablet, TAKE 1 TABLET BY MOUTH ONCE DAILY AT BEDTIME, Disp: 90 tablet, Rfl: 1 ?  omeprazole (PRILOSEC) 20 MG capsule, TAKE 1 CAPSULE BY MOUTH DAILY, Disp: 90 capsule, Rfl: 1 ?  SYMBICORT 80-4.5 MCG/ACT inhaler, INHALE 2 PUFFS BY MOUTH INTO THE LUNGS TWICE A DAY, Disp: 10.2 g, Rfl: 5 ? ? ?Lanier Clam, MD ?[facility] ?08/06/2021 11:18 AM  ? ?

## 2021-08-06 NOTE — Patient Instructions (Signed)
Nice to see you again ? ?No changes to medications today ? ?Please let me know if the Symbicort continues to be too expensive, I can ask our pharmacy colleagues to look into more cost effective options in the same class and category of drug. ? ?Return to clinic in 3 months or sooner as needed with Dr. Silas Flood ?

## 2021-08-07 ENCOUNTER — Other Ambulatory Visit: Payer: Self-pay | Admitting: Pulmonary Disease

## 2021-08-09 DIAGNOSIS — M9903 Segmental and somatic dysfunction of lumbar region: Secondary | ICD-10-CM | POA: Diagnosis not present

## 2021-08-09 DIAGNOSIS — M5136 Other intervertebral disc degeneration, lumbar region: Secondary | ICD-10-CM | POA: Diagnosis not present

## 2021-08-09 DIAGNOSIS — M9904 Segmental and somatic dysfunction of sacral region: Secondary | ICD-10-CM | POA: Diagnosis not present

## 2021-08-09 DIAGNOSIS — M9905 Segmental and somatic dysfunction of pelvic region: Secondary | ICD-10-CM | POA: Diagnosis not present

## 2021-08-16 DIAGNOSIS — M5136 Other intervertebral disc degeneration, lumbar region: Secondary | ICD-10-CM | POA: Diagnosis not present

## 2021-08-16 DIAGNOSIS — M9903 Segmental and somatic dysfunction of lumbar region: Secondary | ICD-10-CM | POA: Diagnosis not present

## 2021-08-16 DIAGNOSIS — M9904 Segmental and somatic dysfunction of sacral region: Secondary | ICD-10-CM | POA: Diagnosis not present

## 2021-08-16 DIAGNOSIS — M9905 Segmental and somatic dysfunction of pelvic region: Secondary | ICD-10-CM | POA: Diagnosis not present

## 2021-08-22 ENCOUNTER — Other Ambulatory Visit: Payer: Self-pay | Admitting: Pulmonary Disease

## 2021-08-24 DIAGNOSIS — M25812 Other specified joint disorders, left shoulder: Secondary | ICD-10-CM | POA: Diagnosis not present

## 2021-08-30 DIAGNOSIS — M9904 Segmental and somatic dysfunction of sacral region: Secondary | ICD-10-CM | POA: Diagnosis not present

## 2021-08-30 DIAGNOSIS — M9903 Segmental and somatic dysfunction of lumbar region: Secondary | ICD-10-CM | POA: Diagnosis not present

## 2021-08-30 DIAGNOSIS — M9905 Segmental and somatic dysfunction of pelvic region: Secondary | ICD-10-CM | POA: Diagnosis not present

## 2021-08-30 DIAGNOSIS — M5136 Other intervertebral disc degeneration, lumbar region: Secondary | ICD-10-CM | POA: Diagnosis not present

## 2021-09-12 DIAGNOSIS — M5136 Other intervertebral disc degeneration, lumbar region: Secondary | ICD-10-CM | POA: Diagnosis not present

## 2021-09-12 DIAGNOSIS — M9905 Segmental and somatic dysfunction of pelvic region: Secondary | ICD-10-CM | POA: Diagnosis not present

## 2021-09-12 DIAGNOSIS — M9904 Segmental and somatic dysfunction of sacral region: Secondary | ICD-10-CM | POA: Diagnosis not present

## 2021-09-12 DIAGNOSIS — M9903 Segmental and somatic dysfunction of lumbar region: Secondary | ICD-10-CM | POA: Diagnosis not present

## 2021-09-20 DIAGNOSIS — N3942 Incontinence without sensory awareness: Secondary | ICD-10-CM | POA: Diagnosis not present

## 2021-09-20 DIAGNOSIS — R3914 Feeling of incomplete bladder emptying: Secondary | ICD-10-CM | POA: Diagnosis not present

## 2021-09-26 DIAGNOSIS — M9904 Segmental and somatic dysfunction of sacral region: Secondary | ICD-10-CM | POA: Diagnosis not present

## 2021-09-26 DIAGNOSIS — M9905 Segmental and somatic dysfunction of pelvic region: Secondary | ICD-10-CM | POA: Diagnosis not present

## 2021-09-26 DIAGNOSIS — M5136 Other intervertebral disc degeneration, lumbar region: Secondary | ICD-10-CM | POA: Diagnosis not present

## 2021-09-26 DIAGNOSIS — M9903 Segmental and somatic dysfunction of lumbar region: Secondary | ICD-10-CM | POA: Diagnosis not present

## 2021-10-09 ENCOUNTER — Telehealth: Payer: Self-pay | Admitting: Pulmonary Disease

## 2021-10-09 DIAGNOSIS — F410 Panic disorder [episodic paroxysmal anxiety] without agoraphobia: Secondary | ICD-10-CM

## 2021-10-09 NOTE — Telephone Encounter (Signed)
Called patient and she states that she is needing refill on her xanax.   Please advise MH

## 2021-10-10 MED ORDER — ALPRAZOLAM 0.25 MG PO TABS: ORAL_TABLET | ORAL | 0 refills | Status: DC

## 2021-10-10 NOTE — Telephone Encounter (Signed)
Called and spoke to patient and advised her that [facility] did refill her xanax for her. Nothing further needed

## 2021-10-10 NOTE — Telephone Encounter (Signed)
Refill sent to local pharmacy.

## 2021-10-11 DIAGNOSIS — R7989 Other specified abnormal findings of blood chemistry: Secondary | ICD-10-CM | POA: Diagnosis not present

## 2021-10-11 DIAGNOSIS — M9904 Segmental and somatic dysfunction of sacral region: Secondary | ICD-10-CM | POA: Diagnosis not present

## 2021-10-11 DIAGNOSIS — M9905 Segmental and somatic dysfunction of pelvic region: Secondary | ICD-10-CM | POA: Diagnosis not present

## 2021-10-11 DIAGNOSIS — E538 Deficiency of other specified B group vitamins: Secondary | ICD-10-CM | POA: Diagnosis not present

## 2021-10-11 DIAGNOSIS — E785 Hyperlipidemia, unspecified: Secondary | ICD-10-CM | POA: Diagnosis not present

## 2021-10-11 DIAGNOSIS — M9903 Segmental and somatic dysfunction of lumbar region: Secondary | ICD-10-CM | POA: Diagnosis not present

## 2021-10-11 DIAGNOSIS — R7309 Other abnormal glucose: Secondary | ICD-10-CM | POA: Diagnosis not present

## 2021-10-11 DIAGNOSIS — M5136 Other intervertebral disc degeneration, lumbar region: Secondary | ICD-10-CM | POA: Diagnosis not present

## 2021-10-11 DIAGNOSIS — E559 Vitamin D deficiency, unspecified: Secondary | ICD-10-CM | POA: Diagnosis not present

## 2021-10-11 DIAGNOSIS — R5383 Other fatigue: Secondary | ICD-10-CM | POA: Diagnosis not present

## 2021-10-14 ENCOUNTER — Other Ambulatory Visit: Payer: Self-pay | Admitting: Pulmonary Disease

## 2021-10-14 DIAGNOSIS — F410 Panic disorder [episodic paroxysmal anxiety] without agoraphobia: Secondary | ICD-10-CM

## 2021-10-18 DIAGNOSIS — E785 Hyperlipidemia, unspecified: Secondary | ICD-10-CM | POA: Diagnosis not present

## 2021-10-18 DIAGNOSIS — Z1331 Encounter for screening for depression: Secondary | ICD-10-CM | POA: Diagnosis not present

## 2021-10-18 DIAGNOSIS — J4530 Mild persistent asthma, uncomplicated: Secondary | ICD-10-CM | POA: Diagnosis not present

## 2021-10-18 DIAGNOSIS — M797 Fibromyalgia: Secondary | ICD-10-CM | POA: Diagnosis not present

## 2021-10-18 DIAGNOSIS — Z1339 Encounter for screening examination for other mental health and behavioral disorders: Secondary | ICD-10-CM | POA: Diagnosis not present

## 2021-10-18 DIAGNOSIS — N3946 Mixed incontinence: Secondary | ICD-10-CM | POA: Diagnosis not present

## 2021-10-18 DIAGNOSIS — R7303 Prediabetes: Secondary | ICD-10-CM | POA: Diagnosis not present

## 2021-10-18 DIAGNOSIS — Z0000 Encounter for general adult medical examination without abnormal findings: Secondary | ICD-10-CM | POA: Diagnosis not present

## 2021-10-18 DIAGNOSIS — I129 Hypertensive chronic kidney disease with stage 1 through stage 4 chronic kidney disease, or unspecified chronic kidney disease: Secondary | ICD-10-CM | POA: Diagnosis not present

## 2021-10-18 DIAGNOSIS — G3184 Mild cognitive impairment, so stated: Secondary | ICD-10-CM | POA: Diagnosis not present

## 2021-10-18 DIAGNOSIS — F339 Major depressive disorder, recurrent, unspecified: Secondary | ICD-10-CM | POA: Diagnosis not present

## 2021-10-18 DIAGNOSIS — I700 Atherosclerosis of aorta: Secondary | ICD-10-CM | POA: Diagnosis not present

## 2021-10-18 DIAGNOSIS — G729 Myopathy, unspecified: Secondary | ICD-10-CM | POA: Diagnosis not present

## 2021-10-19 ENCOUNTER — Other Ambulatory Visit: Payer: Self-pay | Admitting: Pulmonary Disease

## 2021-10-19 DIAGNOSIS — F410 Panic disorder [episodic paroxysmal anxiety] without agoraphobia: Secondary | ICD-10-CM

## 2021-10-26 DIAGNOSIS — N3941 Urge incontinence: Secondary | ICD-10-CM | POA: Diagnosis not present

## 2021-10-26 DIAGNOSIS — R3914 Feeling of incomplete bladder emptying: Secondary | ICD-10-CM | POA: Diagnosis not present

## 2021-10-31 DIAGNOSIS — M533 Sacrococcygeal disorders, not elsewhere classified: Secondary | ICD-10-CM | POA: Diagnosis not present

## 2021-11-07 DIAGNOSIS — M170 Bilateral primary osteoarthritis of knee: Secondary | ICD-10-CM

## 2021-11-07 DIAGNOSIS — M1712 Unilateral primary osteoarthritis, left knee: Secondary | ICD-10-CM | POA: Diagnosis not present

## 2021-11-07 DIAGNOSIS — M1711 Unilateral primary osteoarthritis, right knee: Secondary | ICD-10-CM | POA: Diagnosis not present

## 2021-11-07 HISTORY — DX: Bilateral primary osteoarthritis of knee: M17.0

## 2021-11-19 ENCOUNTER — Ambulatory Visit: Payer: Medicare Other | Admitting: Pulmonary Disease

## 2021-11-19 ENCOUNTER — Encounter: Payer: Self-pay | Admitting: Pulmonary Disease

## 2021-11-19 ENCOUNTER — Ambulatory Visit (INDEPENDENT_AMBULATORY_CARE_PROVIDER_SITE_OTHER): Payer: Medicare Other | Admitting: Pulmonary Disease

## 2021-11-19 VITALS — BP 108/70 | HR 77 | Temp 97.7°F | Ht 64.0 in | Wt 132.0 lb

## 2021-11-19 DIAGNOSIS — J3089 Other allergic rhinitis: Secondary | ICD-10-CM | POA: Diagnosis not present

## 2021-11-19 DIAGNOSIS — J4531 Mild persistent asthma with (acute) exacerbation: Secondary | ICD-10-CM | POA: Diagnosis not present

## 2021-11-19 NOTE — Progress Notes (Signed)
Synopsis: Longstanding patient of Dr. Lake Bells managed here for asthma  Subjective:   PATIENT ID: [name] GENDER: female DOB: 09-29-1933, MRN: [id number]  Chief Complaint  Patient presents with   Follow-up    Pt states she has been doing okay since last visit.    HPI Here for asthma, recurrent sinus congestion complicated by anxiety disorder. Regimen consists of symbicort 80/4.5 BID, singulair, omeprazole, flonase, zyrtec, PRN xanax.   Breathing feels fine.  No real changes.  Endorses ongoing positive response to Symbicort therapy.  Anxiety seems similar to prior.  Continues as needed Xanax which has helped mitigate asthma "exacerbations" over the last couple of years.  She endorses minimal energy. Discouraged by her knee and back/cervical spine pain and discomfort. Continue to crochet.   Labs reviewed and notable for:  IgE 3 2017, 4 2009 Eos not elevated TSH WNL  ROS No orthopnea or PND. No chest pain. Comprehensive review of systems negative.   Objective:  GEN: elderly woman in NAD HEENT: MMM, trachea midline CV: RRR, ext warm PULM: clear in all lung fields, no accessory muscle use GI: Soft, +BS EXT: no edema PSYCH: AOx3, normal affect SKIN: No rashes    Vitals:   11/19/21 0944  BP: 108/70  Pulse: 77  Temp: 97.7 F (36.5 C)  TempSrc: Oral  SpO2: 97%  Weight: 132 lb (59.9 kg)  Height: '5\' 4"'$  (1.626 m)   97% on RA BMI Readings from Last 3 Encounters:  11/19/21 22.66 kg/m  08/06/21 21.35 kg/m  05/07/21 24.37 kg/m   Wt Readings from Last 3 Encounters:  11/19/21 132 lb (59.9 kg)  08/06/21 124 lb 6.4 oz (56.4 kg)  05/07/21 142 lb (64.4 kg)     CBC    Component Value Date/Time   WBC 7.6 03/29/2019 1046   WBC 7.0 10/01/2017 1114   RBC 4.67 03/29/2019 1046   RBC 4.61 10/01/2017 1114   HGB 14.4 03/29/2019 1046   HGB 12.6 05/20/2011 1331   HCT 42.3 03/29/2019 1046   HCT 37.5 05/20/2011 1331   PLT 232 03/29/2019 1046   MCV 91 03/29/2019 1046    MCV 93.7 05/20/2011 1331   MCH 30.8 03/29/2019 1046   MCH 29.9 10/01/2017 1114   MCHC 34.0 03/29/2019 1046   MCHC 33.6 10/01/2017 1114   RDW 12.4 03/29/2019 1046   RDW 15.3 (H) 05/20/2011 1331   LYMPHSABS 1.2 03/29/2019 1046   LYMPHSABS 1.3 05/20/2011 1331   MONOABS 1.2 (H) 09/07/2016 1725   MONOABS 0.4 05/20/2011 1331   EOSABS 0.1 03/29/2019 1046   BASOSABS 0.0 03/29/2019 1046   BASOSABS 0.0 05/20/2011 1331    Chest Imaging: CXR 2019 - clear lungs, mild hyperinflation on my interpretation   Pulmonary Functions Testing Results:    Latest Ref Rng & Units 08/18/2015    9:00 AM  PFT Results  FVC-Pre L 2.43  P  FVC-Predicted Pre % 98  P  FVC-Post L 2.45  P  FVC-Predicted Post % 98  P  Pre FEV1/FVC % % 84  P  Post FEV1/FCV % % 86  P  FEV1-Pre L 2.05  P  FEV1-Predicted Pre % 111  P  FEV1-Post L 2.10  P  DLCO uncorrected ml/min/mmHg 17.47  P  DLCO UNC% % 76  P  DLCO corrected ml/min/mmHg 17.02  P  DLCO COR %Predicted % 74  P  DLVA Predicted % 88  P  TLC L 4.57  P  TLC % Predicted % 93  P  RV % Predicted % 92  P    P Preliminary result  Interpretation: 2017 WNL, moderate fixed obstruction in 2979  Echo diastolic dysfunction 8921 Nuclear stress WNL 2019   Assessment & Plan:   # Mild persistent asthma with superimposed panic attack disorder # Panic attacks worsened by frequent albuterol and prednisone Rx; using xanax has prevented her from needing these meds, using 1/2 tab of 0.25 mg tablet twice a day  # allergic rhinitis, well controlled with azelastine nasal spray  Discussion: - Continue symbicort 80/4.5 BID and albuterol PRN -Continue low dose xanax, most recent refill 09/2021    Current Outpatient Medications:    acetaminophen (TYLENOL) 500 MG tablet, Take by mouth as needed., Disp: , Rfl:    albuterol (VENTOLIN HFA) 108 (90 Base) MCG/ACT inhaler, INHALE 2 PUFFS INTO THE LUNGS EVERY 6 HOURS AS NEEDED FOR WHEEZING OR SHORTNESS OF BREATH, Disp: 8.5 g, Rfl: 3    ALPRAZolam (XANAX) 0.25 MG tablet, TAKE 1/2 TABLET BY MOUTH TWO TIMES DAILYAS NEEDED FOR ANXIETY, Disp: 90 tablet, Rfl: 0   amLODipine (NORVASC) 5 MG tablet, TAKE 1 TABLET BY MOUTH ONCE DAILY, Disp: 90 tablet, Rfl: 3   azelastine (ASTELIN) 0.1 % nasal spray, USE 1 SPRAY IN EACH NOSTRIL TWICE DAILY, Disp: 30 mL, Rfl: 12   cetirizine (ZYRTEC) 10 MG tablet, Take 10 mg by mouth daily. , Disp: , Rfl:    Cholecalciferol (VITAMIN D3) 5000 units TABS, 5,000 IU OTC vitamin D3 daily., Disp: 90 tablet, Rfl: 3   Cyanocobalamin (VITAMIN B-12) 2500 MCG TABS, Take 1,250 mcg by mouth daily., Disp: , Rfl:    DULoxetine (CYMBALTA) 30 MG capsule, Take 1 capsule (30 mg total) by mouth daily. (Patient taking differently: Take 60 mg by mouth daily.), Disp: 90 capsule, Rfl: 0   fluticasone (FLONASE) 50 MCG/ACT nasal spray, USE 2 SPRAYS INTO EACH NOSTRIL EVERY DAY, Disp: 48 g, Rfl: 3   losartan (COZAAR) 100 MG tablet, TAKE 1 TABLET BY MOUTH DAILY, Disp: 90 tablet, Rfl: 1   mirabegron ER (MYRBETRIQ) 25 MG TB24 tablet, 1 tablet Orally Once a day, Disp: , Rfl:    montelukast (SINGULAIR) 10 MG tablet, TAKE 1 TABLET BY MOUTH ONCE DAILY AT BEDTIME, Disp: 90 tablet, Rfl: 1   omeprazole (PRILOSEC) 20 MG capsule, TAKE 1 CAPSULE BY MOUTH DAILY, Disp: 90 capsule, Rfl: 1   SYMBICORT 80-4.5 MCG/ACT inhaler, INHALE 2 PUFFS BY MOUTH INTO THE LUNGS TWICE A DAY, Disp: 10.2 g, Rfl: 5   Lanier Clam, MD [facility] 11/19/2021 9:55 AM

## 2021-11-19 NOTE — Patient Instructions (Addendum)
No changes to medication today  Return to clinic in 6 months or sooner as needed

## 2021-11-30 ENCOUNTER — Encounter (HOSPITAL_COMMUNITY): Payer: Self-pay | Admitting: Emergency Medicine

## 2021-11-30 ENCOUNTER — Emergency Department (HOSPITAL_COMMUNITY): Payer: Medicare Other

## 2021-11-30 ENCOUNTER — Emergency Department (HOSPITAL_COMMUNITY)
Admission: EM | Admit: 2021-11-30 | Discharge: 2021-12-01 | Disposition: A | Discharge status: Home / Self Care | Payer: Medicare Other | Attending: Emergency Medicine | Admitting: Emergency Medicine

## 2021-11-30 DIAGNOSIS — R110 Nausea: Secondary | ICD-10-CM | POA: Insufficient documentation

## 2021-11-30 DIAGNOSIS — R42 Dizziness and giddiness: Secondary | ICD-10-CM | POA: Diagnosis not present

## 2021-11-30 DIAGNOSIS — R001 Bradycardia, unspecified: Secondary | ICD-10-CM | POA: Diagnosis not present

## 2021-11-30 DIAGNOSIS — R61 Generalized hyperhidrosis: Secondary | ICD-10-CM | POA: Diagnosis not present

## 2021-11-30 DIAGNOSIS — Z79899 Other long term (current) drug therapy: Secondary | ICD-10-CM | POA: Diagnosis not present

## 2021-11-30 DIAGNOSIS — I10 Essential (primary) hypertension: Secondary | ICD-10-CM | POA: Diagnosis not present

## 2021-11-30 DIAGNOSIS — R0789 Other chest pain: Secondary | ICD-10-CM | POA: Diagnosis not present

## 2021-11-30 DIAGNOSIS — R079 Chest pain, unspecified: Secondary | ICD-10-CM

## 2021-11-30 LAB — COMPREHENSIVE METABOLIC PANEL
ALT: 25 U/L (ref 0–44)
AST: 24 U/L (ref 15–41)
Albumin: 4.3 g/dL (ref 3.5–5.0)
Alkaline Phosphatase: 66 U/L (ref 38–126)
Anion gap: 13 (ref 5–15)
BUN: 18 mg/dL (ref 8–23)
CO2: 24 mmol/L (ref 22–32)
Calcium: 9.4 mg/dL (ref 8.9–10.3)
Chloride: 95 mmol/L — ABNORMAL LOW (ref 98–111)
Creatinine, Ser: 0.68 mg/dL (ref 0.44–1.00)
GFR, Estimated: >60 mL/min (ref >60)
Glucose, Bld: 147 mg/dL — ABNORMAL HIGH (ref 70–99)
Potassium: 4.1 mmol/L (ref 3.5–5.1)
Sodium: 132 mmol/L — ABNORMAL LOW (ref 135–145)
Total Bilirubin: 0.6 mg/dL (ref 0.3–1.2)
Total Protein: 6.8 g/dL (ref 6.5–8.1)

## 2021-11-30 LAB — CBC
HCT: 48.6 % — ABNORMAL HIGH (ref 36.0–46.0)
Hemoglobin: 14.7 g/dL (ref 12.0–15.0)
MCH: 31.8 pg (ref 26.0–34.0)
MCHC: 30.2 g/dL (ref 30.0–36.0)
MCV: 105.2 fL — ABNORMAL HIGH (ref 80.0–100.0)
Platelets: 176 10*3/uL (ref 150–400)
RBC: 4.62 MIL/uL (ref 3.87–5.11)
RDW: 12.7 % (ref 11.5–15.5)
WBC: 8.9 10*3/uL (ref 4.0–10.5)
nRBC: 0 % (ref 0.0–0.2)

## 2021-11-30 LAB — TROPONIN I (HIGH SENSITIVITY)
Troponin I (High Sensitivity): 10 ng/L (ref <18)
Troponin I (High Sensitivity): 10 ng/L (ref <18)

## 2021-11-30 NOTE — ED Provider Triage Note (Signed)
Emergency Medicine Provider Triage Evaluation Note  [name] , a 88 y.o. female  was evaluated in triage.  Pt complains of chest pain, dizziness.  Patient states that she felt chest pain described as tightness for several milligram movement earlier today she felt associated dizziness.  She also noted episode of diaphoresis when she left the bathroom and went to lay down on her couch.  She called her daughter who told to call EMS.  Associated symptoms during episode included shortness of breath.  Episode lasted a matter of several minutes.  She was brought by EMS who gave her 3 and 24 mg of aspirin.  She is currently endorsing no symptoms.  Denies abdominal pain, fever, cough, sore throat, nausea, vomiting, urinary symptoms, change in bowel habits.  Review of Systems  Positive: See above Negative:   Physical Exam  BP (!) 144/118 (BP Location: Right Arm)   Pulse 88   Temp 98.6 F (37 C) (Oral)   Resp 18   SpO2 97%  Gen:   Awake, no distress   Resp:  Normal effort  MSK:   Moves extremities without difficulty  Other:  Lungs clear to auscultation.  No bilateral lower extremity edema noted.  Regular rate and rhythm.  Medical Decision Making  Medically screening exam initiated at 6:06 PM.  Appropriate orders placed.  [name] was informed that the remainder of the evaluation will be completed by another provider, this initial triage assessment does not replace that evaluation, and the importance of remaining in the ED until their evaluation is complete.     Wilnette Kales, [place] 11/30/21 [phone]

## 2021-11-30 NOTE — ED Triage Notes (Signed)
Patient BIB [facility] from home with complaint of chest pain and dizziness that started while having a bowel movement. Patients reports improvement in symptoms as she was transported to hospital. Patient is alert, oriented, and in no apparent distress at this time. 20g saline lock in right AC from EMS.

## 2021-12-01 NOTE — ED Notes (Signed)
Ambulated patient to assess whether her CP would come back upon exertion. Patient ambulated to bathroom and then asked to ambulate a bit further before returning to her room. Patient denies any CP at this time.

## 2021-12-01 NOTE — ED Provider Notes (Signed)
[facility] EMERGENCY DEPARTMENT Provider Note   CSN: [id number] Arrival date & time: 11/30/21  1718     History  Chief Complaint  Patient presents with   Chest Pain    [name] is a 88 y.o. female.  88 year old female history of hypertension and untreated hyperlipidemia and chronic bronchitis who presents the ER today secondary to an episode of chest pain.  Patient states that she was on the toilet going to the bathroom, urinating, when she all of a sudden felt very disoriented.  She is able to get up and walk from there to her couch where she laid down on a couple pillows.  She started have some chest pain, diaphoresis and nausea.  She states that every time she would sit up or attempt to sit up she would get significantly nauseous and lightheaded.  She called her daughter told her to call EMS.  Patient states that the symptoms lasted no longer than 30 minutes she feels fine now besides being tired hungry and thirsty.  I reviewed the records it appears the patient had a Myoview test in 2019 which was interpreted as nonischemic.  She has no history of cardiac issues but does have a cardiologist secondary to this nonspecific chest pain she had in the past.  She does have history of anxiety but is adamant that she is never had a panic attack like this before.  She also notes that she has some tingling in the top of her head and bilateral lower extremities while this was going on.  No lower extremity swelling.  No recent illnesses.  Compliant with medications.  No trauma.   Chest Pain      Home Medications Prior to Admission medications   Medication Sig Start Date End Date Taking? Authorizing Provider  acetaminophen (TYLENOL) 500 MG tablet Take by mouth as needed.    [provider]  albuterol (VENTOLIN HFA) 108 (90 Base) MCG/ACT inhaler INHALE 2 PUFFS INTO THE LUNGS EVERY 6 HOURS AS NEEDED FOR WHEEZING OR SHORTNESS OF BREATH 05/28/21   Hunsucker, Bonna Gains,  MD  ALPRAZolam Duanne Moron) 0.25 MG tablet TAKE 1/2 TABLET BY MOUTH TWO TIMES DAILYAS NEEDED FOR ANXIETY 10/10/21   Hunsucker, Bonna Gains, MD  amLODipine (NORVASC) 5 MG tablet TAKE 1 TABLET BY MOUTH ONCE DAILY 11/03/20   [name], MD  azelastine (ASTELIN) 0.1 % nasal spray USE 1 SPRAY IN EACH NOSTRIL TWICE DAILY 08/23/21   Hunsucker, Bonna Gains, MD  cetirizine (ZYRTEC) 10 MG tablet Take 10 mg by mouth daily.  03/13/12   Elsie Stain, MD  Cholecalciferol (VITAMIN D3) 5000 units TABS 5,000 IU OTC vitamin D3 daily. 01/19/16   Opalski, Deborah, DO  Cyanocobalamin (VITAMIN B-12) 2500 MCG TABS Take 1,250 mcg by mouth daily. 04/05/19   Opalski, Neoma Laming, DO  DULoxetine (CYMBALTA) 30 MG capsule Take 1 capsule (30 mg total) by mouth daily. Patient taking differently: Take 60 mg by mouth daily. 07/02/19   Opalski, Neoma Laming, DO  fluticasone (FLONASE) 50 MCG/ACT nasal spray USE 2 SPRAYS INTO EACH NOSTRIL EVERY DAY 06/04/13   Rowe Clack, MD  losartan (COZAAR) 100 MG tablet TAKE 1 TABLET BY MOUTH DAILY 05/04/19   Opalski, Neoma Laming, DO  mirabegron ER (MYRBETRIQ) 25 MG TB24 tablet 1 tablet Orally Once a day    [provider]  montelukast (SINGULAIR) 10 MG tablet TAKE 1 TABLET BY MOUTH ONCE DAILY AT BEDTIME 08/07/21   Hunsucker, Bonna Gains, MD  omeprazole (PRILOSEC)  20 MG capsule TAKE 1 CAPSULE BY MOUTH DAILY 03/08/19   Opalski, Neoma Laming, DO  SYMBICORT 80-4.5 MCG/ACT inhaler INHALE 2 PUFFS BY MOUTH INTO THE LUNGS TWICE A DAY 05/28/21   Hunsucker, Bonna Gains, MD      Allergies    Chlorthalidone, Morphine, and Morphine sulfate    Review of Systems   Review of Systems  Cardiovascular:  Positive for chest pain.    Physical Exam Updated Vital Signs BP 120/72 (BP Location: Right Arm)   Pulse 80   Temp 98.2 F (36.8 C) (Oral)   Resp 16   SpO2 98%  Physical Exam Vitals and nursing note reviewed.  Constitutional:      Appearance: She is well-developed.  HENT:     Head: Normocephalic and  atraumatic.  Cardiovascular:     Rate and Rhythm: Normal rate and regular rhythm.  Pulmonary:     Effort: Pulmonary effort is normal. No respiratory distress.     Breath sounds: No stridor.  Abdominal:     General: There is no distension.  Musculoskeletal:        General: Normal range of motion.     Cervical back: Normal range of motion.     Right lower leg: No edema.     Left lower leg: No edema.  Neurological:     Mental Status: She is alert.     ED Results / Procedures / Treatments   Labs (all labs ordered are listed, but only abnormal results are displayed) Labs Reviewed  CBC - Abnormal; Notable for the following components:      Result Value   HCT 48.6 (*)    MCV 105.2 (*)    All other components within normal limits  COMPREHENSIVE METABOLIC PANEL - Abnormal; Notable for the following components:   Sodium 132 (*)    Chloride 95 (*)    Glucose, Bld 147 (*)    All other components within normal limits  TROPONIN I (HIGH SENSITIVITY)  TROPONIN I (HIGH SENSITIVITY)    EKG EKG Interpretation  Date/Time:  Friday November 30 2021 17:23:41 EDT Ventricular Rate:  89 PR Interval:  150 QRS Duration: 140 QT Interval:  422 QTC Calculation: 513 R Axis:   -10 Text Interpretation: Normal sinus rhythm Left bundle branch block Abnormal ECG When compared with ECG of 11-Apr-2005 08:22, PREVIOUS ECG IS PRESENT No sig change from prior tracing Confirmed by Octaviano Glow [phone]) on 12/01/2021 10:37:25 AM  Radiology DG Chest 2 View  Result Date: 11/30/2021 CLINICAL DATA:  Chest pain and dizziness. EXAM: CHEST - 2 VIEW COMPARISON:  Chest x-ray dated 12/19/2017. FINDINGS: Heart size and mediastinal contours are stable. Atherosclerosis of the aortic arch. Lungs are clear. No pleural effusion or pneumothorax. Chronic degenerative change/deformity at the LEFT shoulder. Mild degenerative change in the thoracic spine. No acute-appearing osseous abnormality. IMPRESSION: 1. No active  cardiopulmonary disease. No evidence of pneumonia or pulmonary edema. 2. Aortic atherosclerosis. Electronically Signed   By: Franki Cabot M.D.   On: 11/30/2021 18:48    Procedures Procedures    Medications Ordered in ED Medications - No data to display  ED Course/ Medical Decision Making/ A&P                           Medical Decision Making Amount and/or Complexity of Data Reviewed Labs: ordered. Radiology: ordered.   She definitely has some typical symptoms of ACS however with relatively recent Myoview, negative EKG and flat  negative troponins I think a cardiac event was unlikely.  Also consider possible esophageal spasm.  Less likely PE without lower extremity swelling or any other risk factors besides her age.  Consider possible vertigo versus coronary artery vasospasm however these are difficult to diagnose as her symptoms have improved.  She is basically at baseline at this time.  We will have her ambulate with a walker around the department a little bit and make sure she does not return of symptoms.  If this is fine she can follow-up with Dr. Alvester Chou as an outpatient.  Ambualted without any symptoms. No cp, ha, sob, dizziness. At baseline. Will follow up with Dr. [name].   Final Clinical Impression(s) / ED Diagnoses Final diagnoses:  Nonspecific chest pain    Rx / DC Orders ED Discharge Orders     None         Nesa Distel, Corene Cornea, MD 12/01/21 2352

## 2021-12-06 DIAGNOSIS — M13861 Other specified arthritis, right knee: Secondary | ICD-10-CM | POA: Diagnosis not present

## 2021-12-06 DIAGNOSIS — M13862 Other specified arthritis, left knee: Secondary | ICD-10-CM | POA: Diagnosis not present

## 2021-12-07 DIAGNOSIS — M797 Fibromyalgia: Secondary | ICD-10-CM | POA: Diagnosis not present

## 2021-12-07 DIAGNOSIS — R5383 Other fatigue: Secondary | ICD-10-CM | POA: Diagnosis not present

## 2021-12-07 DIAGNOSIS — I129 Hypertensive chronic kidney disease with stage 1 through stage 4 chronic kidney disease, or unspecified chronic kidney disease: Secondary | ICD-10-CM | POA: Diagnosis not present

## 2021-12-07 DIAGNOSIS — R61 Generalized hyperhidrosis: Secondary | ICD-10-CM | POA: Diagnosis not present

## 2021-12-07 DIAGNOSIS — Z87891 Personal history of nicotine dependence: Secondary | ICD-10-CM | POA: Diagnosis not present

## 2021-12-07 DIAGNOSIS — R079 Chest pain, unspecified: Secondary | ICD-10-CM | POA: Diagnosis not present

## 2021-12-07 DIAGNOSIS — G609 Hereditary and idiopathic neuropathy, unspecified: Secondary | ICD-10-CM | POA: Diagnosis not present

## 2021-12-10 DIAGNOSIS — M25561 Pain in right knee: Secondary | ICD-10-CM | POA: Diagnosis not present

## 2021-12-10 DIAGNOSIS — M25562 Pain in left knee: Secondary | ICD-10-CM | POA: Diagnosis not present

## 2021-12-12 DIAGNOSIS — M170 Bilateral primary osteoarthritis of knee: Secondary | ICD-10-CM | POA: Diagnosis not present

## 2021-12-14 DIAGNOSIS — M170 Bilateral primary osteoarthritis of knee: Secondary | ICD-10-CM | POA: Diagnosis not present

## 2021-12-19 ENCOUNTER — Encounter: Payer: Self-pay | Admitting: Cardiovascular Disease

## 2021-12-19 ENCOUNTER — Ambulatory Visit: Payer: Medicare Other | Attending: Cardiovascular Disease | Admitting: Cardiovascular Disease

## 2021-12-19 DIAGNOSIS — E782 Mixed hyperlipidemia: Secondary | ICD-10-CM

## 2021-12-19 DIAGNOSIS — I10 Essential (primary) hypertension: Secondary | ICD-10-CM | POA: Diagnosis not present

## 2021-12-19 DIAGNOSIS — R0789 Other chest pain: Secondary | ICD-10-CM | POA: Diagnosis not present

## 2021-12-19 DIAGNOSIS — M170 Bilateral primary osteoarthritis of knee: Secondary | ICD-10-CM | POA: Diagnosis not present

## 2021-12-19 NOTE — Progress Notes (Signed)
12/19/2021 [name]   [birth year]  [id number]  Primary Physician [name] Sonya Sans, MD Primary Cardiologist: Sonya Harp MD [name], [place]  HPI:  [name] is a 88 y.o.  mildly-overweight married Caucasian female, mother of 3 and grandmother of 1 grandchild, who worked as a Acupuncturist. I last saw her in the office 09/19/2020.  Her husband [name] was a patient of mine as well unfortunately had  [place] body dementia and passed away in 09/17/22 of last year. She lives  by herself with her daughter [name] who lives 2 miles away and a son who lives up in [place]..Her risk factors include type 2 diabetes, hypertension, and hyperlipidemia. She does have fibromyalgia. She has had a normal 2D echo and Myoview stress test in the past.  Since I saw her a little over a year ago she unfortunately had to have a redo right total hip replacement on 09/24/2016 by Dr. Alvan Barr.  She denies chest pain or shortness of breath.  She does have purplish discoloration of both feet despite normal pulses and normal Doppler studies for unclear reasons.   She saw Sonya Sims  NP in the office 01/29/2018 with atypical chest pain and asthma type symptoms.  Myoview stress test was low risk nonischemic, and echo revealed normal LV function with a anteroapical wall motion abnormality.  I did not think her symptoms were ischemically mediated.   Since I saw her in the office a year ago she continues to do well.  She walks outside with a walker twice a day.  She had an episode of chest pain while in her bathroom and was seen in the emergency room 12/01/2021 with a negative work-up.  Current Meds  Medication Sig   acetaminophen (TYLENOL) 500 MG tablet Take by mouth as needed.   ALPRAZolam (XANAX) 0.25 MG tablet TAKE 1/2 TABLET BY MOUTH TWO TIMES DAILYAS NEEDED FOR ANXIETY   amLODipine (NORVASC) 5 MG tablet TAKE 1 TABLET BY MOUTH ONCE DAILY   azelastine (ASTELIN) 0.1 % nasal spray USE 1 SPRAY IN  EACH NOSTRIL TWICE DAILY   cetirizine (ZYRTEC) 10 MG tablet Take 10 mg by mouth daily.    Cholecalciferol (VITAMIN D3) 5000 units TABS 5,000 IU OTC vitamin D3 daily.   Cyanocobalamin (VITAMIN B-12) 2500 MCG TABS Take 1,250 mcg by mouth daily.   docusate sodium (COLACE) 100 MG capsule Take 100 mg by mouth daily as needed.   DULoxetine (CYMBALTA) 30 MG capsule Take 1 capsule (30 mg total) by mouth daily. (Patient taking differently: Take 60 mg by mouth daily.)   DULoxetine (CYMBALTA) 60 MG capsule Take 60 mg by mouth daily. Take with 30 mg tablet   fluticasone (FLONASE) 50 MCG/ACT nasal spray USE 2 SPRAYS INTO EACH NOSTRIL EVERY DAY   losartan (COZAAR) 100 MG tablet TAKE 1 TABLET BY MOUTH DAILY   mirabegron ER (MYRBETRIQ) 25 MG TB24 tablet 1 tablet Orally Once a day   montelukast (SINGULAIR) 10 MG tablet TAKE 1 TABLET BY MOUTH ONCE DAILY AT BEDTIME   omeprazole (PRILOSEC) 20 MG capsule TAKE 1 CAPSULE BY MOUTH DAILY   SYMBICORT 80-4.5 MCG/ACT inhaler INHALE 2 PUFFS BY MOUTH INTO THE LUNGS TWICE A DAY     Allergies  Allergen Reactions   Chlorthalidone     Other reaction(s): hyponatremia   Morphine    Morphine Sulfate Other (See Comments)    Makes her hyper    Social History   Socioeconomic History  Marital status: Widowed    Spouse name: Not on file   Number of children: 2   Years of education: 35   Highest education level: Master's degree (e.g., MA, MS, MEng, MEd, MSW, MBA)  Occupational History   Occupation: Retired    Comment: Head of [employer]  Tobacco Use   Smoking status: Former    Packs/day: 0.10    Years: 10.00    Total pack years: 1.00    Types: Cigarettes    Quit date: 03/25/1968    Years since quitting: 53.7   Smokeless tobacco: Never  Vaping Use   Vaping Use: Never used  Substance and Sexual Activity   Alcohol use: No    Alcohol/week: 0.0 standard drinks of alcohol   Drug use: No   Sexual activity: Not Currently  Other Topics Concern   Not on file   Social History Narrative   Lives alone  in a 3 story home.  Has 2 children.  Retired Glass blower/designer.  Education: Scientist, water quality. Right handed    Social Determinants of Health   Financial Resource Strain: Not on file  Food Insecurity: Not on file  Transportation Needs: Not on file  Physical Activity: Not on file  Stress: Not on file  Social Connections: Not on file  Intimate Partner Violence: Not on file     Review of Systems: General: negative for chills, fever, night sweats or weight changes.  Cardiovascular: negative for chest pain, dyspnea on exertion, edema, orthopnea, palpitations, paroxysmal nocturnal dyspnea or shortness of breath Dermatological: negative for rash Respiratory: negative for cough or wheezing Urologic: negative for hematuria Abdominal: negative for nausea, vomiting, diarrhea, bright red blood per rectum, melena, or hematemesis Neurologic: negative for visual changes, syncope, or dizziness All other systems reviewed and are otherwise negative except as noted above.    Blood pressure 126/60, pulse 96, height 5' 3.5" (1.613 m), weight 134 lb 6.4 oz (61 kg), SpO2 95 %.  General appearance: alert and no distress Neck: no adenopathy, no carotid bruit, no JVD, supple, symmetrical, trachea midline, and thyroid not enlarged, symmetric, no tenderness/mass/nodules Lungs: clear to auscultation bilaterally Heart: regular rate and rhythm, S1, S2 normal, no murmur, click, rub or gallop Extremities: extremities normal, atraumatic, no cyanosis or edema Pulses: 2+ and symmetric Skin: Skin color, texture, turgor normal. No rashes or lesions Neurologic: Grossly normal  EKG not performed today  ASSESSMENT AND PLAN:   Hyperlipidemia History of hyperlipidemia intolerant to statin therapy with lipid profile performed 10/11/2021 revealing total cholesterol 300, LDL 194 and HDL 91.  She is not interested in pursuing lipid-lowering therapy at this time.  Essential  hypertension History of essential hypertension a blood pressure measured today at 126/60.  She is on amlodipine and losartan.  Atypical chest pain History of atypical chest pain with a negative Myoview stress test performed 02/11/2018.     Sonya Harp MD FACP,FACC,FAHA, [facility] 12/19/2021 3:07 PM

## 2021-12-19 NOTE — Assessment & Plan Note (Signed)
History of hyperlipidemia intolerant to statin therapy with lipid profile performed 10/11/2021 revealing total cholesterol 300, LDL 194 and HDL 91.  She is not interested in pursuing lipid-lowering therapy at this time.

## 2021-12-19 NOTE — Assessment & Plan Note (Signed)
History of essential hypertension a blood pressure measured today at 126/60.  She is on amlodipine and losartan.

## 2021-12-19 NOTE — Assessment & Plan Note (Signed)
History of atypical chest pain with a negative Myoview stress test performed 02/11/2018.

## 2021-12-19 NOTE — Patient Instructions (Signed)
Medication Instructions:  No changes *If you need a refill on your cardiac medications before your next appointment, please call your pharmacy*   Lab Work: None ordered If you have labs (blood work) drawn today and your tests are completely normal, you will receive your results only by: [place] (if you have MyChart) OR A paper copy in the mail If you have any lab test that is abnormal or we need to change your treatment, we will call you to review the results.   Testing/Procedures: None ordered   Follow-Up: At [facility], you and your health needs are our priority.  As part of our continuing mission to provide you with exceptional heart care, we have created designated Provider Care Teams.  These Care Teams include your primary Cardiologist (physician) and Advanced Practice Providers (APPs -  Physician Assistants and Nurse Practitioners) who all work together to provide you with the care you need, when you need it.  We recommend signing up for the patient portal called "MyChart".  Sign up information is provided on this After Visit Summary.  MyChart is used to connect with patients for Virtual Visits (Telemedicine).  Patients are able to view lab/test results, encounter notes, upcoming appointments, etc.  Non-urgent messages can be sent to your provider as well.   To learn more about what you can do with MyChart, go to [email or web address].    Your next appointment:   12 month(s)  The format for your next appointment:   In Person  Provider:   Dr. [name]  Important Information About Sugar

## 2021-12-20 DIAGNOSIS — Z23 Encounter for immunization: Secondary | ICD-10-CM | POA: Diagnosis not present

## 2021-12-24 DIAGNOSIS — M13862 Other specified arthritis, left knee: Secondary | ICD-10-CM | POA: Diagnosis not present

## 2021-12-24 DIAGNOSIS — M13861 Other specified arthritis, right knee: Secondary | ICD-10-CM | POA: Diagnosis not present

## 2021-12-26 DIAGNOSIS — M170 Bilateral primary osteoarthritis of knee: Secondary | ICD-10-CM | POA: Diagnosis not present

## 2021-12-28 DIAGNOSIS — M25562 Pain in left knee: Secondary | ICD-10-CM | POA: Diagnosis not present

## 2021-12-28 DIAGNOSIS — M25561 Pain in right knee: Secondary | ICD-10-CM | POA: Diagnosis not present

## 2021-12-28 DIAGNOSIS — Z23 Encounter for immunization: Secondary | ICD-10-CM | POA: Diagnosis not present

## 2021-12-31 DIAGNOSIS — M25562 Pain in left knee: Secondary | ICD-10-CM | POA: Diagnosis not present

## 2021-12-31 DIAGNOSIS — M25561 Pain in right knee: Secondary | ICD-10-CM | POA: Diagnosis not present

## 2022-01-02 DIAGNOSIS — M25562 Pain in left knee: Secondary | ICD-10-CM | POA: Diagnosis not present

## 2022-01-02 DIAGNOSIS — M25561 Pain in right knee: Secondary | ICD-10-CM | POA: Diagnosis not present

## 2022-01-07 DIAGNOSIS — M25561 Pain in right knee: Secondary | ICD-10-CM | POA: Diagnosis not present

## 2022-01-07 DIAGNOSIS — M25562 Pain in left knee: Secondary | ICD-10-CM | POA: Diagnosis not present

## 2022-01-10 DIAGNOSIS — M25562 Pain in left knee: Secondary | ICD-10-CM | POA: Diagnosis not present

## 2022-01-10 DIAGNOSIS — M25561 Pain in right knee: Secondary | ICD-10-CM | POA: Diagnosis not present

## 2022-01-14 DIAGNOSIS — M25562 Pain in left knee: Secondary | ICD-10-CM | POA: Diagnosis not present

## 2022-01-14 DIAGNOSIS — H01024 Squamous blepharitis left upper eyelid: Secondary | ICD-10-CM | POA: Diagnosis not present

## 2022-01-14 DIAGNOSIS — Z961 Presence of intraocular lens: Secondary | ICD-10-CM | POA: Diagnosis not present

## 2022-01-14 DIAGNOSIS — M25561 Pain in right knee: Secondary | ICD-10-CM | POA: Diagnosis not present

## 2022-01-14 DIAGNOSIS — H40013 Open angle with borderline findings, low risk, bilateral: Secondary | ICD-10-CM | POA: Diagnosis not present

## 2022-01-14 DIAGNOSIS — H04123 Dry eye syndrome of bilateral lacrimal glands: Secondary | ICD-10-CM | POA: Diagnosis not present

## 2022-01-14 DIAGNOSIS — H01021 Squamous blepharitis right upper eyelid: Secondary | ICD-10-CM | POA: Diagnosis not present

## 2022-01-14 DIAGNOSIS — H35372 Puckering of macula, left eye: Secondary | ICD-10-CM | POA: Diagnosis not present

## 2022-01-15 ENCOUNTER — Ambulatory Visit: Payer: Medicare Other | Admitting: Cardiovascular Disease

## 2022-01-17 DIAGNOSIS — M13862 Other specified arthritis, left knee: Secondary | ICD-10-CM | POA: Diagnosis not present

## 2022-01-17 DIAGNOSIS — M13861 Other specified arthritis, right knee: Secondary | ICD-10-CM | POA: Diagnosis not present

## 2022-01-21 ENCOUNTER — Other Ambulatory Visit: Payer: Self-pay | Admitting: Pulmonary Disease

## 2022-01-21 DIAGNOSIS — F410 Panic disorder [episodic paroxysmal anxiety] without agoraphobia: Secondary | ICD-10-CM

## 2022-01-21 NOTE — Telephone Encounter (Signed)
Pt lov: 11/19/21 with Dr. Silas Flood pt was told to follow up in 6 months. A recall was put in. Pt last refill was 10/10/21.  Would you like to refill pt prescription?

## 2022-01-22 DIAGNOSIS — L565 Disseminated superficial actinic porokeratosis (DSAP): Secondary | ICD-10-CM | POA: Diagnosis not present

## 2022-01-22 DIAGNOSIS — D692 Other nonthrombocytopenic purpura: Secondary | ICD-10-CM | POA: Diagnosis not present

## 2022-01-22 DIAGNOSIS — D1801 Hemangioma of skin and subcutaneous tissue: Secondary | ICD-10-CM | POA: Diagnosis not present

## 2022-01-22 DIAGNOSIS — M25562 Pain in left knee: Secondary | ICD-10-CM | POA: Diagnosis not present

## 2022-01-22 DIAGNOSIS — L80 Vitiligo: Secondary | ICD-10-CM | POA: Diagnosis not present

## 2022-01-22 DIAGNOSIS — L821 Other seborrheic keratosis: Secondary | ICD-10-CM | POA: Diagnosis not present

## 2022-01-22 DIAGNOSIS — M25561 Pain in right knee: Secondary | ICD-10-CM | POA: Diagnosis not present

## 2022-01-25 DIAGNOSIS — M13862 Other specified arthritis, left knee: Secondary | ICD-10-CM | POA: Diagnosis not present

## 2022-01-25 DIAGNOSIS — M13861 Other specified arthritis, right knee: Secondary | ICD-10-CM | POA: Diagnosis not present

## 2022-01-28 ENCOUNTER — Other Ambulatory Visit: Payer: Self-pay | Admitting: Pulmonary Disease

## 2022-01-28 DIAGNOSIS — I10 Essential (primary) hypertension: Secondary | ICD-10-CM

## 2022-01-29 ENCOUNTER — Telehealth: Payer: Self-pay | Admitting: Pulmonary Disease

## 2022-01-29 DIAGNOSIS — M170 Bilateral primary osteoarthritis of knee: Secondary | ICD-10-CM | POA: Diagnosis not present

## 2022-01-29 MED ORDER — MONTELUKAST SODIUM 10 MG PO TABS: 10.0000 mg | ORAL_TABLET | Freq: Every day | ORAL | 2 refills | Status: DC

## 2022-01-29 NOTE — Telephone Encounter (Signed)
Called and spoke to patient and she states she is needing refill son her singular. Verified pharmacy with her. Refill sent in. Nothing further needed

## 2022-02-01 DIAGNOSIS — M170 Bilateral primary osteoarthritis of knee: Secondary | ICD-10-CM | POA: Diagnosis not present

## 2022-02-11 ENCOUNTER — Telehealth: Payer: Self-pay

## 2022-02-11 NOTE — Telephone Encounter (Signed)
Medication list received from pt. Confirmed that medication list matches what we have listed.

## 2022-03-14 DIAGNOSIS — M1711 Unilateral primary osteoarthritis, right knee: Secondary | ICD-10-CM | POA: Diagnosis not present

## 2022-03-14 DIAGNOSIS — M1712 Unilateral primary osteoarthritis, left knee: Secondary | ICD-10-CM | POA: Diagnosis not present

## 2022-03-19 DIAGNOSIS — M1712 Unilateral primary osteoarthritis, left knee: Secondary | ICD-10-CM | POA: Diagnosis not present

## 2022-03-19 DIAGNOSIS — M1711 Unilateral primary osteoarthritis, right knee: Secondary | ICD-10-CM | POA: Diagnosis not present

## 2022-03-21 DIAGNOSIS — M1711 Unilateral primary osteoarthritis, right knee: Secondary | ICD-10-CM | POA: Diagnosis not present

## 2022-03-21 DIAGNOSIS — M1712 Unilateral primary osteoarthritis, left knee: Secondary | ICD-10-CM | POA: Diagnosis not present

## 2022-03-26 DIAGNOSIS — M1712 Unilateral primary osteoarthritis, left knee: Secondary | ICD-10-CM | POA: Diagnosis not present

## 2022-03-26 DIAGNOSIS — M1711 Unilateral primary osteoarthritis, right knee: Secondary | ICD-10-CM | POA: Diagnosis not present

## 2022-03-28 DIAGNOSIS — M25562 Pain in left knee: Secondary | ICD-10-CM | POA: Diagnosis not present

## 2022-03-28 DIAGNOSIS — M25561 Pain in right knee: Secondary | ICD-10-CM | POA: Diagnosis not present

## 2022-04-04 ENCOUNTER — Emergency Department (HOSPITAL_COMMUNITY)
Admission: EM | Admit: 2022-04-04 | Discharge: 2022-04-04 | Disposition: A | Discharge status: Home / Self Care | Payer: Medicare Other | Attending: Emergency Medicine | Admitting: Emergency Medicine

## 2022-04-04 ENCOUNTER — Emergency Department (HOSPITAL_COMMUNITY): Payer: Medicare Other

## 2022-04-04 DIAGNOSIS — T1490XA Injury, unspecified, initial encounter: Secondary | ICD-10-CM | POA: Diagnosis not present

## 2022-04-04 DIAGNOSIS — S0511XA Contusion of eyeball and orbital tissues, right eye, initial encounter: Secondary | ICD-10-CM | POA: Diagnosis not present

## 2022-04-04 DIAGNOSIS — I10 Essential (primary) hypertension: Secondary | ICD-10-CM | POA: Diagnosis not present

## 2022-04-04 DIAGNOSIS — S0011XA Contusion of right eyelid and periocular area, initial encounter: Secondary | ICD-10-CM | POA: Diagnosis not present

## 2022-04-04 DIAGNOSIS — S0990XA Unspecified injury of head, initial encounter: Secondary | ICD-10-CM | POA: Diagnosis not present

## 2022-04-04 DIAGNOSIS — W19XXXA Unspecified fall, initial encounter: Secondary | ICD-10-CM

## 2022-04-04 DIAGNOSIS — M25551 Pain in right hip: Secondary | ICD-10-CM | POA: Insufficient documentation

## 2022-04-04 DIAGNOSIS — W1830XA Fall on same level, unspecified, initial encounter: Secondary | ICD-10-CM | POA: Insufficient documentation

## 2022-04-04 DIAGNOSIS — R519 Headache, unspecified: Secondary | ICD-10-CM | POA: Diagnosis present

## 2022-04-04 DIAGNOSIS — S199XXA Unspecified injury of neck, initial encounter: Secondary | ICD-10-CM | POA: Diagnosis not present

## 2022-04-04 DIAGNOSIS — M25511 Pain in right shoulder: Secondary | ICD-10-CM | POA: Diagnosis not present

## 2022-04-04 HISTORY — DX: Pain in right hip: M25.551

## 2022-04-04 LAB — BASIC METABOLIC PANEL
Anion gap: 9 (ref 5–15)
BUN: 19 mg/dL (ref 8–23)
CO2: 27 mmol/L (ref 22–32)
Calcium: 9.1 mg/dL (ref 8.9–10.3)
Chloride: 100 mmol/L (ref 98–111)
Creatinine, Ser: 0.75 mg/dL (ref 0.44–1.00)
GFR, Estimated: >60 mL/min (ref >60)
Glucose, Bld: 101 mg/dL — ABNORMAL HIGH (ref 70–99)
Potassium: 5 mmol/L (ref 3.5–5.1)
Sodium: 136 mmol/L (ref 135–145)

## 2022-04-04 LAB — URINALYSIS, ROUTINE W REFLEX MICROSCOPIC
Bacteria, UA: NONE SEEN
Bilirubin Urine: NEGATIVE
Glucose, UA: NEGATIVE mg/dL
Ketones, ur: NEGATIVE mg/dL
Nitrite: NEGATIVE
Protein, ur: NEGATIVE mg/dL
Specific Gravity, Urine: 1.008 (ref 1.005–1.030)
pH: 6 (ref 5.0–8.0)

## 2022-04-04 LAB — CBC WITH DIFFERENTIAL/PLATELET
Abs Immature Granulocytes: 0.02 10*3/uL (ref 0.00–0.07)
Basophils Absolute: 0 10*3/uL (ref 0.0–0.1)
Basophils Relative: 0 %
Eosinophils Absolute: 0.1 10*3/uL (ref 0.0–0.5)
Eosinophils Relative: 2 %
HCT: 40 % (ref 36.0–46.0)
Hemoglobin: 13 g/dL (ref 12.0–15.0)
Immature Granulocytes: 0 %
Lymphocytes Relative: 21 %
Lymphs Abs: 1.6 10*3/uL (ref 0.7–4.0)
MCH: 30.7 pg (ref 26.0–34.0)
MCHC: 32.5 g/dL (ref 30.0–36.0)
MCV: 94.3 fL (ref 80.0–100.0)
Monocytes Absolute: 0.6 10*3/uL (ref 0.1–1.0)
Monocytes Relative: 8 %
Neutro Abs: 5.2 10*3/uL (ref 1.7–7.7)
Neutrophils Relative %: 69 %
Platelets: 195 10*3/uL (ref 150–400)
RBC: 4.24 MIL/uL (ref 3.87–5.11)
RDW: 13.2 % (ref 11.5–15.5)
WBC: 7.6 10*3/uL (ref 4.0–10.5)
nRBC: 0 % (ref 0.0–0.2)

## 2022-04-04 NOTE — ED Provider Notes (Signed)
[facility] DEPT Provider Note   CSN: [id number] Arrival date & time: 04/04/22  1621     History  Chief Complaint  Patient presents with   [name] is a 89 y.o. female.  HPI   Presents 2 days after a fall, at the behest of her physical therapy team.  She is here with her daughter who assists with the history.  She had mechanical fall 2 days ago, recalls the entire thing.  Since that time she has had some pain in her head, right superior face, had no loss of consciousness and has no new weakness.  She has known arthritis and today went for planned physical therapy due to ongoing pain in her shoulder, hip, right.  There she had plain films, reportedly unremarkable, but with concern for head trauma she was sent here for evaluation.  Daughter confirms no behavioral changes, speech changes.  Patient is not anticoagulated.  Home Medications Prior to Admission medications   Medication Sig Start Date End Date Taking? Authorizing Provider  acetaminophen (TYLENOL) 500 MG tablet Take by mouth as needed.    [provider]  albuterol (VENTOLIN HFA) 108 (90 Base) MCG/ACT inhaler INHALE 2 PUFFS INTO THE LUNGS EVERY 6 HOURS AS NEEDED FOR WHEEZING OR SHORTNESS OF BREATH Patient not taking: Reported on 12/19/2021 05/28/21   Hunsucker, Bonna Gains, MD  ALPRAZolam Duanne Moron) 0.25 MG tablet TAKE 1/2 TABLET BY MOUTH TWO TIMES DAILY AS NEEDED FOR ANXIETY 01/21/22   Hunsucker, Bonna Gains, MD  amLODipine (NORVASC) 5 MG tablet TAKE 1 TABLET BY MOUTH ONCE DAILY 11/03/20   [name], MD  azelastine (ASTELIN) 0.1 % nasal spray USE 1 SPRAY IN EACH NOSTRIL TWICE DAILY 08/23/21   Hunsucker, Bonna Gains, MD  cetirizine (ZYRTEC) 10 MG tablet Take 10 mg by mouth daily.  03/13/12   Elsie Stain, MD  Cholecalciferol (VITAMIN D3) 5000 units TABS 5,000 IU OTC vitamin D3 daily. 01/19/16   Opalski, Deborah, DO  Cyanocobalamin (VITAMIN B-12) 2500 MCG TABS Take 1,250 mcg  by mouth daily. 04/05/19   Opalski, Neoma Laming, DO  docusate sodium (COLACE) 100 MG capsule Take 100 mg by mouth daily as needed.    [provider]  DULoxetine (CYMBALTA) 30 MG capsule Take 1 capsule (30 mg total) by mouth daily. Patient taking differently: Take 60 mg by mouth daily. 07/02/19   Opalski, Neoma Laming, DO  DULoxetine (CYMBALTA) 60 MG capsule Take 60 mg by mouth daily. Take with 30 mg tablet 10/19/21   [provider]  fluticasone (FLONASE) 50 MCG/ACT nasal spray USE 2 SPRAYS INTO EACH NOSTRIL EVERY DAY 06/04/13   Rowe Clack, MD  losartan (COZAAR) 100 MG tablet TAKE 1 TABLET BY MOUTH DAILY 05/04/19   Opalski, Neoma Laming, DO  mirabegron ER (MYRBETRIQ) 25 MG TB24 tablet 1 tablet Orally Once a day    [provider]  montelukast (SINGULAIR) 10 MG tablet Take 1 tablet (10 mg total) by mouth at bedtime. 01/29/22   Hunsucker, Bonna Gains, MD  omeprazole (PRILOSEC) 20 MG capsule TAKE 1 CAPSULE BY MOUTH DAILY 03/08/19   Opalski, Neoma Laming, DO  SYMBICORT 80-4.5 MCG/ACT inhaler INHALE 2 PUFFS BY MOUTH INTO THE LUNGS TWICE A DAY 05/28/21   Hunsucker, Bonna Gains, MD      Allergies    Chlorthalidone, Morphine, and Morphine sulfate    Review of Systems   Review of Systems  All other systems reviewed and are negative.   Physical Exam  Updated Vital Signs BP (!) 141/71   Pulse 81   Temp 97.7 F (36.5 C) (Oral)   Resp 14   SpO2 98%  Physical Exam Vitals and nursing note reviewed.  Constitutional:      General: She is not in acute distress.    Appearance: She is well-developed.  HENT:     Head: Normocephalic.   Eyes:     Conjunctiva/sclera: Conjunctivae normal.  Cardiovascular:     Rate and Rhythm: Normal rate and regular rhythm.  Pulmonary:     Effort: Pulmonary effort is normal. No respiratory distress.     Breath sounds: Normal breath sounds. No stridor.  Abdominal:     General: There is no distension.  Musculoskeletal:     Cervical back: Normal range of motion  and neck supple. No spinous process tenderness or muscular tenderness.  Skin:    General: Skin is warm and dry.  Neurological:     General: No focal deficit present.     Mental Status: She is alert and oriented to person, place, and time.     Cranial Nerves: No cranial nerve deficit.     Motor: No weakness, tremor or abnormal muscle tone.     Coordination: Coordination normal.  Psychiatric:        Mood and Affect: Mood normal.     ED Results / Procedures / Treatments   Labs (all labs ordered are listed, but only abnormal results are displayed) Labs Reviewed  BASIC METABOLIC PANEL - Abnormal; Notable for the following components:      Result Value   Glucose, Bld 101 (*)    All other components within normal limits  URINALYSIS, ROUTINE W REFLEX MICROSCOPIC - Abnormal; Notable for the following components:   Color, Urine STRAW (*)    Hgb urine dipstick SMALL (*)    Leukocytes,Ua MODERATE (*)    All other components within normal limits  CBC WITH DIFFERENTIAL/PLATELET    EKG None  Radiology CT Cervical Spine Wo Contrast  Result Date: 04/04/2022 CLINICAL DATA:  Neck trauma. EXAM: CT CERVICAL SPINE WITHOUT CONTRAST TECHNIQUE: Multidetector CT imaging of the cervical spine was performed without intravenous contrast. Multiplanar CT image reconstructions were also generated. RADIATION DOSE REDUCTION: This exam was performed according to the departmental dose-optimization program which includes automated exposure control, adjustment of the mA and/or kV according to patient size and/or use of iterative reconstruction technique. COMPARISON:  None Available. FINDINGS: Alignment: Normal. Skull base and vertebrae: No acute fracture. No primary bone lesion or focal pathologic process. Soft tissues and spinal canal: No prevertebral fluid or swelling. No visible canal hematoma. Disc levels: Extensive degenerative changes identified with disc space narrowing and marginal osteophyte formation from  C3-4 through C7-T1. Facet joint degenerative changes noted bilaterally at each cervical level as well. Osteoarthritis at C1-C2. Upper chest: Negative. IMPRESSION: Extensive degenerative changes.  No acute traumatic abnormality. Electronically Signed   By: Sammie Bench M.D.   On: 04/04/2022 17:13   CT Orbits Wo Contrast  Result Date: 04/04/2022 CLINICAL DATA:  Trauma EXAM: CT ORBITS WITHOUT CONTRAST TECHNIQUE: Multidetector CT imaging of the orbits was performed using the standard protocol without intravenous contrast. Multiplanar CT image reconstructions were also generated. RADIATION DOSE REDUCTION: This exam was performed according to the departmental dose-optimization program which includes automated exposure control, adjustment of the mA and/or kV according to patient size and/or use of iterative reconstruction technique. COMPARISON:  None Available. FINDINGS: No acute osseous abnormalities. Orbital contents are unremarkable. No fluid  collections. No osteolytic or osteoblastic changes. Sinuses are clear. IMPRESSION: Unremarkable examination of the orbits. Electronically Signed   By: Sammie Bench M.D.   On: 04/04/2022 17:10   CT Head Wo Contrast  Result Date: 04/04/2022 CLINICAL DATA:  Head trauma. EXAM: CT HEAD WITHOUT CONTRAST TECHNIQUE: Contiguous axial images were obtained from the base of the skull through the vertex without intravenous contrast. RADIATION DOSE REDUCTION: This exam was performed according to the departmental dose-optimization program which includes automated exposure control, adjustment of the mA and/or kV according to patient size and/or use of iterative reconstruction technique. COMPARISON:  None Available. FINDINGS: Brain: There is periventricular white matter decreased attenuation consistent with small vessel ischemic changes. Ventricles, sulci and cisterns are prominent consistent with age related involutional changes. No acute intracranial hemorrhage, mass effect or  shift. No hydrocephalus. Vascular: No hyperdense vessel or unexpected calcification. Skull: Normal. Negative for fracture or focal lesion. Sinuses/Orbits: No acute finding. IMPRESSION: Atrophy and chronic small vessel ischemic changes. No acute intracranial process identified. Electronically Signed   By: Sammie Bench M.D.   On: 04/04/2022 17:08    Procedures Procedures    Medications Ordered in ED Medications - No data to display  ED Course/ Medical Decision Making/ A&P                           Medical Decision Making Female with a history of osteoporosis, ongoing physical therapy due to the soreness presents after a fall that occurred 2 days ago and now with evidence for facial trauma.  She is awake, alert, distally neurovascularly unremarkable, hemodynamically unremarkable, but differential including fracture versus intracranial hemorrhage, versus ocular trauma considered.  CT scans, labs ordered from triage.  Amount and/or Complexity of Data Reviewed Independent Historian: caregiver External Data Reviewed: notes. Labs:  Decision-making details documented in ED Course. Radiology:  Decision-making details documented in ED Course.   6:17 PM Patient in no distress.  I reviewed his CT imaging at bedside with the patient and her daughter, labs as well.  No evidence for acute intracranial abnormality, no fractures, no substantial lab abnormalities that she has no urinary complaints, after lengthy conversation, patient discharged in stable condition.        Final Clinical Impression(s) / ED Diagnoses Final diagnoses:  Fall, initial encounter    Rx / DC Orders ED Discharge Orders     None         Carmin Muskrat, MD 04/04/22 [phone]

## 2022-04-04 NOTE — Discharge Instructions (Signed)
As discussed, your evaluation today has been largely reassuring.  But, it is important that you monitor your condition carefully, and do not hesitate to return to the ED if you develop new, or concerning changes in your condition. ? ?Otherwise, please follow-up with your physician for appropriate ongoing care. ? ?

## 2022-04-04 NOTE — ED Triage Notes (Signed)
Pt referred to ED from [facility] after a a fall on Tuesday morning in her house. Pt states she wasn't using her walker to the bathroom early in the morning and fell on the hardwood floor, hitting her face on the ground. Pt denies blood thinner use, no LOC. Pt denies any shoulder, wrist, knee, hip pain.

## 2022-04-04 NOTE — ED Provider Triage Note (Addendum)
Emergency Medicine Provider Triage Evaluation Note  [name] , a 89 y.o. female  was evaluated in triage.  Patient complains of a fall that occurred around a week ago.  She went to [facility] for her physical therapy and they told her she needed to come here because she had bruising around her orbit and she never was seen after her fall.  She says that she got up and it was very early in the morning and she fell onto bilateral outstretched hands.  Denies any pain to the upper extremities  Throwing many pvcs on monitor. Occasionally trigeminy   Review of Systems  Positive:  Negative:   Physical Exam  BP (!) 149/117 (BP Location: Left Arm)   Pulse 63   Temp 97.7 F (36.5 C) (Oral)   Resp 16   SpO2 98%  Gen:   Awake, no distress   Resp:  Normal effort  MSK:   Moves extremities without difficulty  Other:  Bruising to patient's right orbit.  EOMs intact without pain.  PERRLA  Medical Decision Making  Medically screening exam initiated at 4:40 PM.  Appropriate orders placed.  Elliona A Appleby was informed that the remainder of the evaluation will be completed by another provider, this initial triage assessment does not replace that evaluation, and the importance of remaining in the ED until their evaluation is complete.     Rhae Hammock, PA-C 04/04/22 1642    Rhae Hammock, PA-C 04/04/22 1702

## 2022-04-08 DIAGNOSIS — M9901 Segmental and somatic dysfunction of cervical region: Secondary | ICD-10-CM | POA: Diagnosis not present

## 2022-04-08 DIAGNOSIS — M5032 Other cervical disc degeneration, mid-cervical region, unspecified level: Secondary | ICD-10-CM | POA: Diagnosis not present

## 2022-04-09 DIAGNOSIS — M25561 Pain in right knee: Secondary | ICD-10-CM | POA: Diagnosis not present

## 2022-04-09 DIAGNOSIS — M25562 Pain in left knee: Secondary | ICD-10-CM | POA: Diagnosis not present

## 2022-04-11 DIAGNOSIS — M25561 Pain in right knee: Secondary | ICD-10-CM | POA: Diagnosis not present

## 2022-04-11 DIAGNOSIS — M25562 Pain in left knee: Secondary | ICD-10-CM | POA: Diagnosis not present

## 2022-04-12 DIAGNOSIS — S0011XA Contusion of right eyelid and periocular area, initial encounter: Secondary | ICD-10-CM | POA: Diagnosis not present

## 2022-04-12 DIAGNOSIS — H353131 Nonexudative age-related macular degeneration, bilateral, early dry stage: Secondary | ICD-10-CM | POA: Diagnosis not present

## 2022-04-15 DIAGNOSIS — M1711 Unilateral primary osteoarthritis, right knee: Secondary | ICD-10-CM | POA: Diagnosis not present

## 2022-04-16 DIAGNOSIS — M9901 Segmental and somatic dysfunction of cervical region: Secondary | ICD-10-CM | POA: Diagnosis not present

## 2022-04-16 DIAGNOSIS — M5032 Other cervical disc degeneration, mid-cervical region, unspecified level: Secondary | ICD-10-CM | POA: Diagnosis not present

## 2022-04-22 ENCOUNTER — Other Ambulatory Visit: Payer: Self-pay | Admitting: Pulmonary Disease

## 2022-04-22 DIAGNOSIS — F410 Panic disorder [episodic paroxysmal anxiety] without agoraphobia: Secondary | ICD-10-CM

## 2022-04-22 DIAGNOSIS — M13861 Other specified arthritis, right knee: Secondary | ICD-10-CM | POA: Diagnosis not present

## 2022-04-22 DIAGNOSIS — M13862 Other specified arthritis, left knee: Secondary | ICD-10-CM | POA: Diagnosis not present

## 2022-04-24 DIAGNOSIS — M9901 Segmental and somatic dysfunction of cervical region: Secondary | ICD-10-CM | POA: Diagnosis not present

## 2022-04-24 DIAGNOSIS — M5032 Other cervical disc degeneration, mid-cervical region, unspecified level: Secondary | ICD-10-CM | POA: Diagnosis not present

## 2022-04-25 DIAGNOSIS — R7303 Prediabetes: Secondary | ICD-10-CM | POA: Diagnosis not present

## 2022-04-25 DIAGNOSIS — E785 Hyperlipidemia, unspecified: Secondary | ICD-10-CM | POA: Diagnosis not present

## 2022-04-25 DIAGNOSIS — I129 Hypertensive chronic kidney disease with stage 1 through stage 4 chronic kidney disease, or unspecified chronic kidney disease: Secondary | ICD-10-CM | POA: Diagnosis not present

## 2022-04-25 DIAGNOSIS — F339 Major depressive disorder, recurrent, unspecified: Secondary | ICD-10-CM | POA: Diagnosis not present

## 2022-04-25 DIAGNOSIS — Z87891 Personal history of nicotine dependence: Secondary | ICD-10-CM | POA: Diagnosis not present

## 2022-04-25 DIAGNOSIS — M25562 Pain in left knee: Secondary | ICD-10-CM | POA: Diagnosis not present

## 2022-04-25 DIAGNOSIS — M25561 Pain in right knee: Secondary | ICD-10-CM | POA: Diagnosis not present

## 2022-04-25 DIAGNOSIS — M797 Fibromyalgia: Secondary | ICD-10-CM | POA: Diagnosis not present

## 2022-04-25 DIAGNOSIS — N3946 Mixed incontinence: Secondary | ICD-10-CM | POA: Diagnosis not present

## 2022-04-25 DIAGNOSIS — I700 Atherosclerosis of aorta: Secondary | ICD-10-CM | POA: Diagnosis not present

## 2022-04-25 DIAGNOSIS — G3184 Mild cognitive impairment, so stated: Secondary | ICD-10-CM | POA: Diagnosis not present

## 2022-04-25 DIAGNOSIS — J4530 Mild persistent asthma, uncomplicated: Secondary | ICD-10-CM | POA: Diagnosis not present

## 2022-04-25 DIAGNOSIS — G729 Myopathy, unspecified: Secondary | ICD-10-CM | POA: Diagnosis not present

## 2022-04-30 ENCOUNTER — Ambulatory Visit (INDEPENDENT_AMBULATORY_CARE_PROVIDER_SITE_OTHER): Payer: Medicare Other | Admitting: Neurology

## 2022-04-30 ENCOUNTER — Other Ambulatory Visit: Payer: Self-pay

## 2022-04-30 ENCOUNTER — Encounter: Payer: Self-pay | Admitting: Neurology

## 2022-04-30 VITALS — BP 107/58 | HR 93 | Ht 63.5 in | Wt 135.0 lb

## 2022-04-30 DIAGNOSIS — M13862 Other specified arthritis, left knee: Secondary | ICD-10-CM | POA: Diagnosis not present

## 2022-04-30 DIAGNOSIS — G3184 Mild cognitive impairment, so stated: Secondary | ICD-10-CM | POA: Diagnosis not present

## 2022-04-30 DIAGNOSIS — M13861 Other specified arthritis, right knee: Secondary | ICD-10-CM | POA: Diagnosis not present

## 2022-04-30 MED ORDER — DONEPEZIL HCL 10 MG PO TABS: ORAL_TABLET | ORAL | 11 refills | Status: DC

## 2022-04-30 NOTE — Progress Notes (Signed)
Follow-up Visit   Date: 04/30/2022    [name] MRN: [id number] DOB: October 18, 1933    [name] is a 89 y.o. right-handed Caucasian female with migraine, hypertension, asthma, anxiety, hyperlipidemia, diabetes mellitus, MCI returning to the clinic for follow-up of memory changes.  The patient was accompanied to the clinic by self.  IMPRESSION/PLAN: Mild cognitive impairment with progression.  She scored 24/30 on MOCA, missing points mostly for delayed recall. She is still highly independent and able to do own IADLs and ADLs.  She is very concerned about dementia and would like repeat neuropsychological testing.  Given that there has been mild worsening which may indicate early dementia, I offered Aricep '5mg'$  x 1 month, then '10mg'$  daily.  Side effects discussed.  I encouraged her to continue to stay active and engage in mentally stimulating activities.   Return to clinic after testing.   --------------------------------------------- History of present illness: She was evaluated in 2017 for memory complaints described difficulty with word-finding and remembering names.  Neurocognitive testing showed mild cognitive impairment and anxiety.  She is here because these symptoms have become much worse.  She has become increasingly forgetful, such as with recipes or reading a book.  She has difficulty with navigating in unfamiliar areas and has got lost when driving.  She has to plan her trips to be sure she knows where she is going.  She is unable to remember names, she tries to manage her own ADLs and has assistance as needed.  Her daughter lives 2 miles from her. Her husband has since passed.   She continues to crochet and knit.  She manages her own finances, but has her daughter oversee. She has some errors managing her medications. She depends on making lists and uses her calender for appointments. She uses a walker when walking outside of the home and a cane at home. She is highly  concerned about dementia because her father and sister had it.   Regarding her feet, she is bothered by the discoloration and pain.  She has [name] disease and is followed by Dr. Estanislado Pandy.  No numbness/tingling.   UPDATE 04/30/2022:  She is here for follow-up of memory loss.  Prior neuropsychological testing from 2022 was consistent with MCI, however, she is very concerned about dementia because of her family history and memory changes she is starting to see more of. She continues to live alone and drives, goes grocery shopping, and manages own finances/medications. She avoid using stairs.  She is mindful of where she drives because she does not drive in unfamiliar areas.  She has difficulty with short-term memory and misplacing objects. She reads but does not remember the storyline.  She had a fall several weeks ago while trying to get out of bed.  Daughter is POA.    Medications:  Current Outpatient Medications on File Prior to Visit  Medication Sig Dispense Refill   acetaminophen (TYLENOL) 500 MG tablet Take by mouth as needed.     albuterol (VENTOLIN HFA) 108 (90 Base) MCG/ACT inhaler INHALE 2 PUFFS INTO THE LUNGS EVERY 6 HOURS AS NEEDED FOR WHEEZING OR SHORTNESS OF BREATH 8.5 g 3   ALPRAZolam (XANAX) 0.25 MG tablet TKAE ONE-HALF TABLET BY MOUTH TWICE DAILY AS NEEDED FOR ANXIETY 90 tablet 0   amLODipine (NORVASC) 5 MG tablet TAKE 1 TABLET BY MOUTH ONCE DAILY 90 tablet 3   azelastine (ASTELIN) 0.1 % nasal spray USE 1 SPRAY IN EACH NOSTRIL TWICE DAILY 30 mL 12  cetirizine (ZYRTEC) 10 MG tablet Take 10 mg by mouth daily.      Cholecalciferol (VITAMIN D3) 5000 units TABS 5,000 IU OTC vitamin D3 daily. 90 tablet 3   Cyanocobalamin (VITAMIN B-12) 2500 MCG TABS Take 1,250 mcg by mouth daily.     docusate sodium (COLACE) 100 MG capsule Take 100 mg by mouth daily as needed.     DULoxetine (CYMBALTA) 30 MG capsule Take 1 capsule (30 mg total) by mouth daily. 90 capsule 0   DULoxetine (CYMBALTA) 60  MG capsule Take 60 mg by mouth daily. Take with 30 mg tablet     fluticasone (FLONASE) 50 MCG/ACT nasal spray USE 2 SPRAYS INTO EACH NOSTRIL EVERY DAY 48 g 3   losartan (COZAAR) 100 MG tablet TAKE 1 TABLET BY MOUTH DAILY 90 tablet 1   mirabegron ER (MYRBETRIQ) 25 MG TB24 tablet 1 tablet Orally Once a day     montelukast (SINGULAIR) 10 MG tablet Take 1 tablet (10 mg total) by mouth at bedtime. 90 tablet 2   omeprazole (PRILOSEC) 20 MG capsule TAKE 1 CAPSULE BY MOUTH DAILY 90 capsule 1   SYMBICORT 80-4.5 MCG/ACT inhaler INHALE 2 PUFFS BY MOUTH INTO THE LUNGS TWICE A DAY 10.2 g 5   No current facility-administered medications on file prior to visit.    Allergies:  Allergies  Allergen Reactions   Chlorthalidone     Other reaction(s): hyponatremia   Morphine    Morphine Sulfate Other (See Comments)    Makes her hyper    Vital Signs:  BP (!) 107/58   Pulse 93   Ht 5' 3.5" (1.613 m)   Wt 135 lb (61.2 kg)   SpO2 96%   BMI 23.54 kg/m     Neurological Exam: MENTAL STATUS including orientation to time, place, person, recent and remote memory, attention span and concentration, language, and fund of knowledge is fairly normal.  Speech is not dysarthric.    04/30/2022    2:45 PM 05/25/2020   12:06 PM 08/25/2015   10:26 AM  Montreal Cognitive Assessment   Visuospatial/ Executive (0/5) '5 2 4  '$ Naming (0/3) '3 2 3  '$ Attention: Read list of digits (0/2) '2 2 2  '$ Attention: Read list of letters (0/1) '1 1 1  '$ Attention: Serial 7 subtraction starting at 100 (0/3) '2 3 3  '$ Language: Repeat phrase (0/2) '2 2 2  '$ Language : Fluency (0/1) '1 1 1  '$ Abstraction (0/2) '1 1 2  '$ Delayed Recall (0/5) 1 0 2  Orientation (0/6) '6 6 6  '$ Total '24 20 26  '$ Adjusted Score (based on education)  20 26    CRANIAL NERVES:  Pupils equal round and reactive to light.  Normal conjugate, extra-ocular eye movements in all directions of gaze.  No ptosis.  Face is symmetric.   MOTOR:  Motor strength is 5/5 in all extremities.  No  atrophy, fasciculations or abnormal movements.  No pronator drift.  Tone is normal.    COORDINATION/GAIT:  Intact rapid alternating movements bilaterally.  Gait is stooped, small steps, unassisted.   Data: MRI brain wo contrast 06/22/2020; 1. No acute intracranial abnormality. 2. Multiple old small vessel infarcts and findings of chronic ischemic microangiopathy.  Neuropsychological testing 08/03/2020:  mild cognitive impairment   Thank you for allowing me to participate in patient's care.  If I can answer any additional questions, I would be pleased to do so.    Sincerely,    Lissa Rowles K. Posey Pronto, DO

## 2022-04-30 NOTE — Patient Instructions (Addendum)
Start aricept '5mg'$  (half tablet) daily x 1 month, then increase to '10mg'$  (1 tablet) daily    Formal neuropsychological testing  Return after testing

## 2022-05-01 DIAGNOSIS — M5032 Other cervical disc degeneration, mid-cervical region, unspecified level: Secondary | ICD-10-CM | POA: Diagnosis not present

## 2022-05-01 DIAGNOSIS — M9901 Segmental and somatic dysfunction of cervical region: Secondary | ICD-10-CM | POA: Diagnosis not present

## 2022-05-02 DIAGNOSIS — M25561 Pain in right knee: Secondary | ICD-10-CM | POA: Diagnosis not present

## 2022-05-02 DIAGNOSIS — M25562 Pain in left knee: Secondary | ICD-10-CM | POA: Diagnosis not present

## 2022-05-07 DIAGNOSIS — M25562 Pain in left knee: Secondary | ICD-10-CM | POA: Diagnosis not present

## 2022-05-07 DIAGNOSIS — M25561 Pain in right knee: Secondary | ICD-10-CM | POA: Diagnosis not present

## 2022-05-08 DIAGNOSIS — M9901 Segmental and somatic dysfunction of cervical region: Secondary | ICD-10-CM | POA: Diagnosis not present

## 2022-05-08 DIAGNOSIS — M5032 Other cervical disc degeneration, mid-cervical region, unspecified level: Secondary | ICD-10-CM | POA: Diagnosis not present

## 2022-05-09 DIAGNOSIS — R350 Frequency of micturition: Secondary | ICD-10-CM | POA: Diagnosis not present

## 2022-05-09 DIAGNOSIS — N3942 Incontinence without sensory awareness: Secondary | ICD-10-CM | POA: Diagnosis not present

## 2022-05-10 DIAGNOSIS — M25562 Pain in left knee: Secondary | ICD-10-CM | POA: Diagnosis not present

## 2022-05-10 DIAGNOSIS — M25561 Pain in right knee: Secondary | ICD-10-CM | POA: Diagnosis not present

## 2022-05-14 DIAGNOSIS — M5032 Other cervical disc degeneration, mid-cervical region, unspecified level: Secondary | ICD-10-CM | POA: Diagnosis not present

## 2022-05-14 DIAGNOSIS — M9901 Segmental and somatic dysfunction of cervical region: Secondary | ICD-10-CM | POA: Diagnosis not present

## 2022-05-14 DIAGNOSIS — M13861 Other specified arthritis, right knee: Secondary | ICD-10-CM | POA: Diagnosis not present

## 2022-05-14 DIAGNOSIS — M13862 Other specified arthritis, left knee: Secondary | ICD-10-CM | POA: Diagnosis not present

## 2022-05-20 DIAGNOSIS — M9901 Segmental and somatic dysfunction of cervical region: Secondary | ICD-10-CM | POA: Diagnosis not present

## 2022-05-20 DIAGNOSIS — M5032 Other cervical disc degeneration, mid-cervical region, unspecified level: Secondary | ICD-10-CM | POA: Diagnosis not present

## 2022-05-21 DIAGNOSIS — M13862 Other specified arthritis, left knee: Secondary | ICD-10-CM | POA: Diagnosis not present

## 2022-05-21 DIAGNOSIS — M13861 Other specified arthritis, right knee: Secondary | ICD-10-CM | POA: Diagnosis not present

## 2022-05-27 ENCOUNTER — Encounter: Payer: Self-pay | Admitting: Emergency Medicine

## 2022-05-27 ENCOUNTER — Ambulatory Visit
Admission: EM | Admit: 2022-05-27 | Discharge: 2022-05-27 | Disposition: A | Discharge status: Home / Self Care | Payer: Medicare Other | Attending: Physician Assistant | Admitting: Physician Assistant

## 2022-05-27 DIAGNOSIS — S40021A Contusion of right upper arm, initial encounter: Secondary | ICD-10-CM

## 2022-05-27 DIAGNOSIS — S40811A Abrasion of right upper arm, initial encounter: Secondary | ICD-10-CM | POA: Diagnosis not present

## 2022-05-27 MED ORDER — MUPIROCIN 2 % EX OINT
TOPICAL_OINTMENT | Freq: Two times a day (BID) | CUTANEOUS | 0 refills | Status: DC
Start: 2022-05-27 — End: 2022-05-27

## 2022-05-27 MED ORDER — SILVER SULFADIAZINE 1 % EX CREA: 1.0000 | TOPICAL_CREAM | Freq: Every day | CUTANEOUS | 0 refills | Status: AC

## 2022-05-27 NOTE — ED Triage Notes (Signed)
Patient states she fell today, injuring her right elbow and arm.  The area is bruised and bleeding.  Patient has taken Tylenol and Advil.

## 2022-05-27 NOTE — ED Provider Notes (Signed)
[facility]    CSN: [id number] Arrival date & time: 05/27/22  1517      History   Chief Complaint Chief Complaint  Patient presents with   Fall    HPI [name] is a 89 y.o. female.   89 year old female presents with right upper arm contusion.  Patient indicates this afternoon she fell at the house striking the fireplace with the back of her arm.  She indicates that the fall caused an abrasion to occur on the lower backside of the right upper arm and right upper forearm.  She indicates that the time there was mild bleeding.  She indicates she has had mild bruising from the area.  There is mild pain but she has full motion.  There is no weakness, numbness or tingling.  Patient indicates that her last tetanus has been less than 5 years ago.  Is currently not taking any medicine for pain relief.   Fall    Past Medical History:  Diagnosis Date    Muscle spasms of back due to scoliosis 05/27/2018   Adjustment disorder with mixed anxiety and depressed mood 01/11/2016   Allergic rhinitis 08/12/2007   Asthma, mild persistent 12/19/2008   Mild persistent asthma with atopy March 2017 eosinophil count 100 cells per microliter, IgE 26 Jul 2015 pulmonary function testing ratio 86%, FEV1 2.10 L (113% predicted), FVC 2.45 (98% predicted), no change in FEV1 with bronchodilator, total lung capacity 4.57 L (93% predicted). DLCO 17.47 (76% predicted).     Bronchitis    Chronic constipation    Chronic fatigue syndrome with fibromyalgia 01/20/2016   Chronic kidney disease due to hypertension 08/27/2019   Chronic pain 05/27/2018   DDD (degenerative disc disease), lumbar 10/21/2017   Status post surgery x2   Essential hypertension 08/12/2007   Gastro-esophageal reflux disease without esophagitis 08/12/2007   Generalized anxiety disorder 08/12/2007   Hardening of the aorta (main artery of the heart)    Herpes simplex labialis 04/03/2012   History of cataracts    bilateral   History  of COPD 10/01/2017   History of migraine headaches    History of urinary tract infection 05/30/2010   Hyperlipidemia 08/12/2007   Insomnia 04/23/2017   Interstitial cystitis    Iron deficiency anemia 11/03/2009   Left bundle branch block 11/13/2010   Lumbar radiculopathy, chronic    Lumbar spondylosis 02/24/2020   MCI (mild cognitive impairment) 08/03/2020   Metatarsalgia of left foot 04/23/2019   Osteoarthritis 08/12/2007   Qualifier: Diagnosis of  By: Jim Like     Pain in joint of right shoulder 06/25/2017   Prediabetes 01/11/2016   Primary [name] disease without gangrene 06/02/2017   Shingles 04/12/2011   Trochanteric bursitis of right hip 06/25/2017   Ulcer of toe 04/23/2019   Vitamin B deficiency 08/27/2019   Vitamin B12 deficiency 08/30/2015   Please inform patient that she has vitamin B12 deficiency.  Start Vitamin B12 1077mg IM injection daily x 7 days, weekly x 4 weeks, then monthly thereafter x 1 year.      Vitamin D deficiency 01/19/2016    Patient Active Problem List   Diagnosis Date Noted   MCI (mild cognitive impairment) 08/03/2020   Hardening of the aorta (main artery of the heart)    Lumbar spondylosis 02/24/2020   Abnormal glucose level 08/27/2019   Chronic kidney disease due to hypertension 08/27/2019   Vitamin B deficiency 08/27/2019   Arthralgia 07/02/2019   Metatarsalgia of left foot 04/23/2019  Muscle spasms of back due to scoliosis 05/27/2018   Chronic pain 05/27/2018   Atypical chest pain 02/17/2018   DDD (degenerative disc disease), lumbar 10/21/2017   History of COPD 10/01/2017   Former smoker 10/01/2017   Pain in joint of right shoulder 06/25/2017   Trochanteric bursitis of right hip 06/25/2017   Primary [name] disease without gangrene 06/02/2017   Insomnia 04/23/2017   S/P right TH revision 09/24/2016   Medication refused- (any cholesterol ones) 01/20/2016   Environmental and seasonal allergies 01/20/2016   Chronic fatigue syndrome with  fibromyalgia 01/20/2016   Vitamin D deficiency 01/19/2016   Prediabetes 01/11/2016   Adjustment disorder with mixed anxiety and depressed mood 01/11/2016   Medication intolerance- statins- (make her FM much W) 01/10/2016   Vitamin B12 deficiency 08/30/2015   Dysuria 08/18/2015   Lumbar radiculopathy, chronic    Herpes simplex labialis 04/03/2012   Left bundle branch block 11/13/2010   History of urinary tract infection 05/30/2010   Iron deficiency anemia 11/03/2009   Asthma, mild persistent 12/19/2008   Hyperlipidemia 08/12/2007   Generalized anxiety disorder 08/12/2007   Essential hypertension 08/12/2007   Allergic rhinitis 08/12/2007   Gastro-esophageal reflux disease without esophagitis 08/12/2007   Osteoarthritis 08/12/2007    Past Surgical History:  Procedure Laterality Date   ABDOMINAL HYSTERECTOMY     ANTERIOR HIP REVISION Right 09/24/2016   Procedure: RIGHT HIP ACETABULUM AND FEMORAL HEAD REVISION WITH BONE GRAFT;  Surgeon: Paralee Cancel, MD;  Location: [facility];  Service: Orthopedics;  Laterality: Right;   APPENDECTOMY     BACK SURGERY     CATARACT EXTRACTION     COLONOSCOPY     DOPPLER ECHOCARDIOGRAPHY     ESOPHAGOGASTRODUODENOSCOPY ENDOSCOPY     JOINT REPLACEMENT     LUMBAR DISC SURGERY     NM MYOVIEW LTD     stress test   Ovarian cyst removed     ROTATOR CUFF REPAIR Left 5-6 years ago   Dr Onnie Graham; done at [facility] surgery center    [place]      OB History   No obstetric history on file.      Home Medications    Prior to Admission medications   Medication Sig Start Date End Date Taking? Authorizing Provider  acetaminophen (TYLENOL) 500 MG tablet Take by mouth as needed.   Yes [provider]  albuterol (VENTOLIN HFA) 108 (90 Base) MCG/ACT inhaler INHALE 2 PUFFS INTO THE LUNGS EVERY 6 HOURS AS NEEDED FOR WHEEZING OR SHORTNESS OF BREATH 05/28/21  Yes Hunsucker, Bonna Gains, MD  ALPRAZolam (XANAX) 0.25 MG tablet  TKAE ONE-HALF TABLET BY MOUTH TWICE DAILY AS NEEDED FOR ANXIETY 04/24/22  Yes Hunsucker, Bonna Gains, MD  amLODipine (NORVASC) 5 MG tablet TAKE 1 TABLET BY MOUTH ONCE DAILY 11/03/20  Yes [name], MD  azelastine (ASTELIN) 0.1 % nasal spray USE 1 SPRAY IN EACH NOSTRIL TWICE DAILY 08/23/21  Yes Hunsucker, Bonna Gains, MD  cetirizine (ZYRTEC) 10 MG tablet Take 10 mg by mouth daily.  03/13/12  Yes Elsie Stain, MD  Cholecalciferol (VITAMIN D3) 5000 units TABS 5,000 IU OTC vitamin D3 daily. 01/19/16  Yes Opalski, Deborah, DO  Cyanocobalamin (VITAMIN B-12) 2500 MCG TABS Take 1,250 mcg by mouth daily. 04/05/19  Yes Opalski, Neoma Laming, DO  docusate sodium (COLACE) 100 MG capsule Take 100 mg by mouth daily as needed.   Yes [provider]  donepezil (ARICEPT) 10 MG tablet Take  half tablet x 4 weeks, then increase to 1 tablet daily. 04/30/22  Yes Patel, Donika K, DO  DULoxetine (CYMBALTA) 30 MG capsule Take 1 capsule (30 mg total) by mouth daily. 07/02/19  Yes Opalski, Neoma Laming, DO  DULoxetine (CYMBALTA) 60 MG capsule Take 60 mg by mouth daily. Take with 30 mg tablet 10/19/21  Yes [provider]  fluticasone (FLONASE) 50 MCG/ACT nasal spray USE 2 SPRAYS INTO EACH NOSTRIL EVERY DAY 06/04/13  Yes Rowe Clack, MD  losartan (COZAAR) 100 MG tablet TAKE 1 TABLET BY MOUTH DAILY 05/04/19  Yes Opalski, Neoma Laming, DO  mirabegron ER (MYRBETRIQ) 25 MG TB24 tablet 1 tablet Orally Once a day   Yes [provider]  montelukast (SINGULAIR) 10 MG tablet Take 1 tablet (10 mg total) by mouth at bedtime. 01/29/22  Yes Hunsucker, Bonna Gains, MD  omeprazole (PRILOSEC) 20 MG capsule TAKE 1 CAPSULE BY MOUTH DAILY 03/08/19  Yes Opalski, Neoma Laming, DO  silver sulfADIAZINE (SILVADENE) 1 % cream Apply 1 Application topically daily. 05/27/22  Yes Nyoka Lint, PA-C  SYMBICORT 80-4.5 MCG/ACT inhaler INHALE 2 PUFFS BY MOUTH INTO THE LUNGS TWICE A DAY 05/28/21  Yes Hunsucker, Bonna Gains, MD    Family History Family  History  Problem Relation Age of Onset   Arthritis Mother    Hypertension Mother    Heart disease Father    Arthritis Father    Hypertension Father    Memory loss Father        likely Alzheimer's disease; symptom onst in 32s   Depression Sister    Hyperlipidemia Sister    Hypertension Other    Hyperlipidemia Other    Parkinson's disease Sister    Memory loss Sister     Social History Social History   Tobacco Use   Smoking status: Former    Packs/day: 0.10    Years: 10.00    Total pack years: 1.00    Types: Cigarettes    Quit date: 03/25/1968    Years since quitting: 54.2   Smokeless tobacco: Never  Vaping Use   Vaping Use: Never used  Substance Use Topics   Alcohol use: No    Alcohol/week: 0.0 standard drinks of alcohol   Drug use: No     Allergies   Chlorthalidone, Morphine, and Morphine sulfate   Review of Systems Review of Systems  Skin:  Positive for wound (right upper arm abraison).     Physical Exam Triage Vital Signs ED Triage Vitals  Enc Vitals Group     BP 05/27/22 1655 (!) 153/70     Pulse Rate 05/27/22 1655 75     Resp 05/27/22 1655 18     Temp 05/27/22 1655 97.7 F (36.5 C)     Temp Source 05/27/22 1655 Oral     SpO2 05/27/22 1655 93 %     Weight 05/27/22 1657 125 lb (56.7 kg)     Height 05/27/22 1657 '5\' 4"'$  (1.626 m)     Head Circumference --      Peak Flow --      Pain Score 05/27/22 1657 0     Pain Loc --      Pain Edu? --      Excl. in [place]? --    No data found.  Updated Vital Signs BP (!) 153/70 (BP Location: Left Arm)   Pulse 75   Temp 97.7 F (36.5 C) (Oral)   Resp 18   Ht '5\' 4"'$  (1.626 m)   Wt 125 lb (  56.7 kg)   SpO2 93%   BMI 21.46 kg/m   Visual Acuity Right Eye Distance:   Left Eye Distance:   Bilateral Distance:    Right Eye Near:   Left Eye Near:    Bilateral Near:  Right upper arm:       Physical Exam Constitutional:      Appearance: Normal appearance.  Musculoskeletal:       Arms:     Comments:  Right upper arm: The posterior aspect of the upper arm has a 5 x 3 cm area of diffuse mild abrasions to where the skin is peeled back in several different areas.  There is mild swelling of the site, no active drainage.  Right upper forearm: The posterior aspect of the upper posterior forearm has a 3 x 2 cm abrasion with mild bruising present.  There is no drainage. Flexion extension is normal at the elbow, supination and pronation are normal without restriction.  There is no pain with movement.  Neurological:     Mental Status: She is alert.      UC Treatments / Results  Labs (all labs ordered are listed, but only abnormal results are displayed) Labs Reviewed - No data to display  EKG   Radiology No results found.  Procedures Procedures (including critical care time)  Medications Ordered in UC Medications - No data to display  Initial Impression / Assessment and Plan / UC Course  I have reviewed the triage vital signs and the nursing notes.  Pertinent labs & imaging results that were available during my care of the patient were reviewed by me and considered in my medical decision making (see chart for details).    Plan: This will be treated with the following: 1.  Contusion right upper arm: A.  Advised take ibuprofen or Tylenol as needed for pain. 2.  Abrasion of right upper arm: A.  Advised to use Silvadene cream to the area daily to prevent infection. 3.  Advised follow-up PCP or return to urgent care as needed. Final Clinical Impressions(s) / UC Diagnoses   Final diagnoses:  Contusion of multiple sites of right upper arm, initial encounter  Abrasion of right upper arm, initial encounter     Discharge Instructions      Clean the area twice daily with warm soapy water and just dab the area, do not rub.  Advised to apply the Bactroban lightly to the areas twice a day to help prevent infection.  Make sure to use nonstick gauze after applying the ointment.  Wrap  with Coban to protect.  Watch for any signs of infection if redness, unusual swelling or drainage occurs then he will need to be evaluated at that time.    ED Prescriptions     Medication Sig Dispense Auth. Provider   mupirocin ointment (BACTROBAN) 2 %  (Status: Discontinued) Apply topically 2 (two) times daily. 44 g Nyoka Lint, PA-C   silver sulfADIAZINE (SILVADENE) 1 % cream Apply 1 Application topically daily. 50 g Nyoka Lint, PA-C   mupirocin ointment (BACTROBAN) 2 %  (Status: Discontinued) Apply topically 2 (two) times daily. 44 g Nyoka Lint, PA-C      PDMP not reviewed this encounter.   Nyoka Lint, PA-C 05/27/22 1730

## 2022-05-27 NOTE — Discharge Instructions (Signed)
Clean the area twice daily with warm soapy water and just dab the area, do not rub.  Advised to apply the Bactroban lightly to the areas twice a day to help prevent infection.  Make sure to use nonstick gauze after applying the ointment.  Wrap with Coban to protect.  Watch for any signs of infection if redness, unusual swelling or drainage occurs then he will need to be evaluated at that time.

## 2022-05-29 ENCOUNTER — Ambulatory Visit (INDEPENDENT_AMBULATORY_CARE_PROVIDER_SITE_OTHER): Payer: Medicare Other | Admitting: Pulmonary Disease

## 2022-05-29 ENCOUNTER — Encounter: Payer: Self-pay | Admitting: Pulmonary Disease

## 2022-05-29 VITALS — BP 132/70 | HR 52 | Temp 98.6°F | Wt 136.6 lb

## 2022-05-29 DIAGNOSIS — J4531 Mild persistent asthma with (acute) exacerbation: Secondary | ICD-10-CM | POA: Diagnosis not present

## 2022-05-29 DIAGNOSIS — R0781 Pleurodynia: Secondary | ICD-10-CM | POA: Diagnosis not present

## 2022-05-29 DIAGNOSIS — M5032 Other cervical disc degeneration, mid-cervical region, unspecified level: Secondary | ICD-10-CM | POA: Diagnosis not present

## 2022-05-29 DIAGNOSIS — S40811A Abrasion of right upper arm, initial encounter: Secondary | ICD-10-CM | POA: Diagnosis not present

## 2022-05-29 DIAGNOSIS — M9901 Segmental and somatic dysfunction of cervical region: Secondary | ICD-10-CM | POA: Diagnosis not present

## 2022-05-29 DIAGNOSIS — W19XXXA Unspecified fall, initial encounter: Secondary | ICD-10-CM | POA: Diagnosis not present

## 2022-05-29 MED ORDER — SYMBICORT 80-4.5 MCG/ACT IN AERO: INHALATION_SPRAY | RESPIRATORY_TRACT | 11 refills | Status: AC

## 2022-05-29 MED ORDER — ALBUTEROL SULFATE HFA 108 (90 BASE) MCG/ACT IN AERS: 2.0000 | INHALATION_SPRAY | Freq: Four times a day (QID) | RESPIRATORY_TRACT | 5 refills | Status: DC | PRN

## 2022-05-29 NOTE — Progress Notes (Signed)
Synopsis: Longstanding patient of Dr. Lake Bells managed here for asthma  Subjective:   PATIENT ID: [name] GENDER: female DOB: 10/15/1933, MRN: [id number]  Chief Complaint  Patient presents with   Follow-up    Follow up for asthma. Pt states no drastic issues since last office visit. She states that she is doing well. She did have a fall last month.     HPI Here for asthma, recurrent sinus congestion complicated by anxiety disorder. Regimen consists of symbicort 80/4.5 BID, singulair, omeprazole, flonase, zyrtec, PRN xanax.   Breathing feels fine.  No real changes.  Endorses ongoing positive response to Symbicort therapy.  Anxiety seems similar to prior.  Continues as needed Xanax which has helped mitigate asthma "exacerbations" over the last couple of years. Using mostly in evenings.  She endorses minimal energy. Discouraged by her knee and back/cervical spine pain and discomfort. Recent falls due to joint issues.  Labs reviewed and notable for:  IgE 3 2017, 4 2009 Eos not elevated TSH WNL  ROS No orthopnea or PND. No chest pain. Comprehensive review of systems negative.   Objective:  GEN: elderly woman in NAD HEENT: MMM, trachea midline CV: RRR, ext warm PULM: clear in all lung fields, no accessory muscle use GI: Soft, +BS EXT: no edema PSYCH: AOx3, normal affect SKIN: No rashes    Vitals:   05/29/22 1337  BP: 132/70  Pulse: (!) 52  Temp: 98.6 F (37 C)  TempSrc: Oral  SpO2: 96%  Weight: 136 lb 9.6 oz (62 kg)   96% on RA BMI Readings from Last 3 Encounters:  05/29/22 23.45 kg/m  05/27/22 21.46 kg/m  04/30/22 23.54 kg/m   Wt Readings from Last 3 Encounters:  05/29/22 136 lb 9.6 oz (62 kg)  05/27/22 125 lb (56.7 kg)  04/30/22 135 lb (61.2 kg)     CBC    Component Value Date/Time   WBC 7.6 04/04/2022 1712   RBC 4.24 04/04/2022 1712   HGB 13.0 04/04/2022 1712   HGB 14.4 03/29/2019 1046   HGB 12.6 05/20/2011 1331   HCT 40.0 04/04/2022 1712    HCT 42.3 03/29/2019 1046   HCT 37.5 05/20/2011 1331   PLT 195 04/04/2022 1712   PLT 232 03/29/2019 1046   MCV 94.3 04/04/2022 1712   MCV 91 03/29/2019 1046   MCV 93.7 05/20/2011 1331   MCH 30.7 04/04/2022 1712   MCHC 32.5 04/04/2022 1712   RDW 13.2 04/04/2022 1712   RDW 12.4 03/29/2019 1046   RDW 15.3 (H) 05/20/2011 1331   LYMPHSABS 1.6 04/04/2022 1712   LYMPHSABS 1.2 03/29/2019 1046   LYMPHSABS 1.3 05/20/2011 1331   MONOABS 0.6 04/04/2022 1712   MONOABS 0.4 05/20/2011 1331   EOSABS 0.1 04/04/2022 1712   EOSABS 0.1 03/29/2019 1046   BASOSABS 0.0 04/04/2022 1712   BASOSABS 0.0 03/29/2019 1046   BASOSABS 0.0 05/20/2011 1331    Chest Imaging: CXR 2019 - clear lungs, mild hyperinflation on my interpretation   Pulmonary Functions Testing Results:    Latest Ref Rng & Units 08/18/2015    9:00 AM  PFT Results  FVC-Pre L 2.43  P  FVC-Predicted Pre % 98  P  FVC-Post L 2.45  P  FVC-Predicted Post % 98  P  Pre FEV1/FVC % % 84  P  Post FEV1/FCV % % 86  P  FEV1-Pre L 2.05  P  FEV1-Predicted Pre % 111  P  FEV1-Post L 2.10  P  DLCO uncorrected ml/min/mmHg 17.47  P  DLCO UNC% % 76  P  DLCO corrected ml/min/mmHg 17.02  P  DLCO COR %Predicted % 74  P  DLVA Predicted % 88  P  TLC L 4.57  P  TLC % Predicted % 93  P  RV % Predicted % 92  P    P Preliminary result  Interpretation: 2017 WNL, moderate fixed obstruction in [id number]  Echo diastolic dysfunction [id number] Nuclear stress WNL 2019   Assessment & Plan:   # Mild persistent asthma with superimposed panic attack disorder # Panic attacks worsened by frequent albuterol and prednisone Rx; using xanax has prevented her from needing these meds, using 1/2 tab of 0.25 mg tablet twice a day  # allergic rhinitis, well controlled with azelastine nasal spray  Discussion: - Continue symbicort 80/4.5 BID and albuterol PRN, refilled  - Continue low dose xanax, most recent refill 03/2022    Current Outpatient Medications:    acetaminophen  (TYLENOL) 500 MG tablet, Take by mouth as needed., Disp: , Rfl:    albuterol (VENTOLIN HFA) 108 (90 Base) MCG/ACT inhaler, INHALE 2 PUFFS INTO THE LUNGS EVERY 6 HOURS AS NEEDED FOR WHEEZING OR SHORTNESS OF BREATH, Disp: 8.5 g, Rfl: 3   ALPRAZolam (XANAX) 0.25 MG tablet, TKAE ONE-HALF TABLET BY MOUTH TWICE DAILY AS NEEDED FOR ANXIETY, Disp: 90 tablet, Rfl: 0   amLODipine (NORVASC) 5 MG tablet, TAKE 1 TABLET BY MOUTH ONCE DAILY, Disp: 90 tablet, Rfl: 3   azelastine (ASTELIN) 0.1 % nasal spray, USE 1 SPRAY IN EACH NOSTRIL TWICE DAILY, Disp: 30 mL, Rfl: 12   cetirizine (ZYRTEC) 10 MG tablet, Take 10 mg by mouth daily. , Disp: , Rfl:    Cholecalciferol (VITAMIN D3) 5000 units TABS, 5,000 IU OTC vitamin D3 daily., Disp: 90 tablet, Rfl: 3   Cyanocobalamin (VITAMIN B-12) 2500 MCG TABS, Take 1,250 mcg by mouth daily., Disp: , Rfl:    docusate sodium (COLACE) 100 MG capsule, Take 100 mg by mouth daily as needed., Disp: , Rfl:    DULoxetine (CYMBALTA) 30 MG capsule, Take 1 capsule (30 mg total) by mouth daily., Disp: 90 capsule, Rfl: 0   DULoxetine (CYMBALTA) 60 MG capsule, Take 60 mg by mouth daily. Take with 30 mg tablet, Disp: , Rfl:    fluticasone (FLONASE) 50 MCG/ACT nasal spray, USE 2 SPRAYS INTO EACH NOSTRIL EVERY DAY, Disp: 48 g, Rfl: 3   losartan (COZAAR) 100 MG tablet, TAKE 1 TABLET BY MOUTH DAILY, Disp: 90 tablet, Rfl: 1   mirabegron ER (MYRBETRIQ) 25 MG TB24 tablet, 1 tablet Orally Once a day, Disp: , Rfl:    montelukast (SINGULAIR) 10 MG tablet, Take 1 tablet (10 mg total) by mouth at bedtime., Disp: 90 tablet, Rfl: 2   omeprazole (PRILOSEC) 20 MG capsule, TAKE 1 CAPSULE BY MOUTH DAILY, Disp: 90 capsule, Rfl: 1   silver sulfADIAZINE (SILVADENE) 1 % cream, Apply 1 Application topically daily., Disp: 50 g, Rfl: 0   SYMBICORT 80-4.5 MCG/ACT inhaler, INHALE 2 PUFFS BY MOUTH INTO THE LUNGS TWICE A DAY, Disp: 10.2 g, Rfl: 5   donepezil (ARICEPT) 10 MG tablet, Take half tablet x 4 weeks, then increase  to 1 tablet daily. (Patient not taking: Reported on 05/29/2022), Disp: 30 tablet, Rfl: 11   Lanier Clam, MD [facility] 05/29/2022 1:49 PM

## 2022-05-29 NOTE — Patient Instructions (Addendum)
Nice to see you again  No changes to medications  I refilled the symbicort and albuterol  If too expensive please let me know and we will look for an alternative  Return to clinic in 6 months or sooner as needed

## 2022-06-05 DIAGNOSIS — R0781 Pleurodynia: Secondary | ICD-10-CM | POA: Diagnosis not present

## 2022-06-05 DIAGNOSIS — S40811D Abrasion of right upper arm, subsequent encounter: Secondary | ICD-10-CM | POA: Diagnosis not present

## 2022-06-05 DIAGNOSIS — M5032 Other cervical disc degeneration, mid-cervical region, unspecified level: Secondary | ICD-10-CM | POA: Diagnosis not present

## 2022-06-05 DIAGNOSIS — M9901 Segmental and somatic dysfunction of cervical region: Secondary | ICD-10-CM | POA: Diagnosis not present

## 2022-06-12 DIAGNOSIS — M5032 Other cervical disc degeneration, mid-cervical region, unspecified level: Secondary | ICD-10-CM | POA: Diagnosis not present

## 2022-06-12 DIAGNOSIS — M9901 Segmental and somatic dysfunction of cervical region: Secondary | ICD-10-CM | POA: Diagnosis not present

## 2022-06-18 DIAGNOSIS — M9901 Segmental and somatic dysfunction of cervical region: Secondary | ICD-10-CM | POA: Diagnosis not present

## 2022-06-18 DIAGNOSIS — M5032 Other cervical disc degeneration, mid-cervical region, unspecified level: Secondary | ICD-10-CM | POA: Diagnosis not present

## 2022-06-20 ENCOUNTER — Encounter: Payer: Self-pay | Admitting: Psychology

## 2022-06-20 ENCOUNTER — Ambulatory Visit: Payer: Medicare Other

## 2022-06-20 ENCOUNTER — Ambulatory Visit (INDEPENDENT_AMBULATORY_CARE_PROVIDER_SITE_OTHER): Payer: Medicare Other | Admitting: Psychology

## 2022-06-20 DIAGNOSIS — G3184 Mild cognitive impairment, so stated: Secondary | ICD-10-CM

## 2022-06-20 DIAGNOSIS — G9332 Myalgic encephalomyelitis/chronic fatigue syndrome: Secondary | ICD-10-CM | POA: Diagnosis not present

## 2022-06-20 DIAGNOSIS — M13861 Other specified arthritis, right knee: Secondary | ICD-10-CM | POA: Diagnosis not present

## 2022-06-20 DIAGNOSIS — I6381 Other cerebral infarction due to occlusion or stenosis of small artery: Secondary | ICD-10-CM

## 2022-06-20 DIAGNOSIS — M13862 Other specified arthritis, left knee: Secondary | ICD-10-CM | POA: Diagnosis not present

## 2022-06-20 DIAGNOSIS — M797 Fibromyalgia: Secondary | ICD-10-CM

## 2022-06-20 DIAGNOSIS — F411 Generalized anxiety disorder: Secondary | ICD-10-CM

## 2022-06-20 DIAGNOSIS — R4189 Other symptoms and signs involving cognitive functions and awareness: Secondary | ICD-10-CM

## 2022-06-20 NOTE — Progress Notes (Signed)
   Psychometrician Note   Cognitive testing was administered to [name] by [name], B.S. (psychometrist) under the supervision of [name], Ph.D., licensed psychologist on 06/20/2022. [name] did not appear overtly distressed by the testing session per behavioral observation or responses across self-report questionnaires. Rest breaks were offered.    The battery of tests administered was selected by [name], Ph.D. with consideration to [name]'s current level of functioning, the nature of her symptoms, emotional and behavioral responses during interview, level of literacy, observed level of motivation/effort, and the nature of the referral question. This battery was communicated to the psychometrist. Communication between [name], Ph.D. and the psychometrist was ongoing throughout the evaluation and [name], Ph.D. was immediately accessible at all times. [name], Ph.D. provided supervision to the psychometrist on the date of this service to the extent necessary to assure the quality of all services provided.    [name] will return within approximately 1-2 weeks for an interactive feedback session with [name] at which time her test performances, clinical impressions, and treatment recommendations will be reviewed in detail. [name] understands she can contact our office should she require our assistance before this time.  A total of 110 minutes of billable time were spent face-to-face with [name] by the psychometrist. This includes both test administration and scoring time. Billing for these services is reflected in the clinical report generated by [name], Ph.D.  This note reflects time spent with the psychometrician and does not include test scores or any clinical interpretations made by [name]. The full report will follow in a separate note.

## 2022-06-20 NOTE — Progress Notes (Signed)
NEUROPSYCHOLOGICAL EVALUATION . [facility] Department of Neurology  Date of Evaluation: June 20, 2022  Reason for Referral:   [name] is a 89 y.o. right-handed Caucasian female referred by Narda Amber, D.O., to characterize her current cognitive functioning and assist with diagnostic clarity and treatment planning in the context of subjective cognitive dysfunction.   Assessment and Plan:   Clinical Impression(s): [name] pattern of performance is suggestive of performance variability across memory measures, with notable impairment with delayed retrieval aspects across list and figure-based memory tasks. Performance was appropriate across all aspects of a story-based memory task. Performances were adequate relative to age-matched peers across all other assessed cognitive domains. This includes processing speed, attention/concentration, executive functioning, receptive and expressive language, and visuospatial abilities. [name] denied difficulties completing instrumental activities of daily living (ADLs) independently. Her daughter was in agreement with this. As such, given evidence for cognitive dysfunction described above, she continues to best meet diagnostic criteria for a Mild Neurocognitive Disorder ("mild cognitive impairment") at the present time.  Relative to her previous evaluations in January 2018 and May 2022, there has been evidence for very slow, gradual decline across list and figure-based learning and memory, particularly aspects of delayed recall and recognition/consolidation. Story-based memory has remained largely stable and appropriate over time. General stability can also be seen across all non-memory cognitive domains over the years.   The etiology for her current presentation continues to be unknown and may be multifactorial in nature. While the very early stages of Alzheimer's disease cannot be ruled out given some evidence  for memory decline over the years, her overall pattern continues to not be particularly compelling. This disease process certainly cannot be ruled in with any degree of certainty. If truly present, testing would suggest an extremely slow progression, which continues to be encouraging.   Outside of a primary neurological cause, [name] reported acute symptoms of mild to moderate depression and anxiety respectively. She also described debilitating diffuse chronic pain and some ongoing sleep dysfunction. 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 It is important to highlight that deficits in delayed retrieval aspects of memory can be seen in all of these causes, especially when combined. It remains plausible that this represents a better explanation for isolated dysfunction more so than underlying Alzheimer's disease. Continued medical monitoring will be important moving forward.   Recommendations: A repeat neuropsychological evaluation in 24 months (or sooner if functional decline is noted) could be considered to assess the trajectory of future cognitive decline should it occur.  [name] had to discontinue donepezil/Aricept due to negative side effects. She could discuss an alternative medication with her neurologist to address memory loss and concerns surrounding cognitive decline. She is encouraged to continue taking this medication as prescribed. It is important to highlight that this medication has been shown to slow functional decline in some individuals. There is no current treatment which can stop or reverse cognitive decline when caused by a neurodegenerative illness.   A combination of medication and psychotherapy has been shown to be most effective at treating symptoms of anxiety and  depression. As such, [name] is encouraged to speak with her prescribing physician regarding medication adjustments to optimally manage these symptoms. Likewise, [name] could also consider engaging in short-term psychotherapy to address symptoms of psychiatric  distress. She would benefit from an active and collaborative therapeutic environment, rather than one purely supportive in nature. Recommended treatment modalities include Cognitive Behavioral Therapy (CBT) or Acceptance and Commitment Therapy (ACT).  Ongoing efforts to better manage chronic pain could also yield cognitive improvements with reduced pain symptoms.   Performance across neurocognitive testing is not a strong predictor of an individual's safety operating a motor vehicle. Should her family wish to pursue a formalized driving evaluation, they could reach out to the following agencies: [employer] in [place]: [phone] [employer]: [facility]: [place]: [phone] or [phone]  Should there be progression of current deficits over time, [name] is unlikely to regain any independent living skills lost. Therefore, it is recommended that she remain as involved as possible in all aspects of household chores, finances, and medication management, with supervision to ensure adequate performance. She will likely benefit from the establishment and maintenance of a routine in order to maximize her functional abilities over time.  It will be important for [name] to have another person with her when in situations where she may need to process information, weigh the pros and cons of different options, and make decisions, in order to ensure that she fully understands and recalls all information to be considered.  If not already done, [name] and her family may want to discuss her wishes regarding durable power of attorney and medical decision  making, so that she can have input into these choices. If they require legal assistance with this, long-term care resource access, or other aspects of estate planning, they could reach out to [facility] at [phone] for a free consultation. Additionally, they may wish to discuss future plans for caretaking and seek out community options for in home/residential care should they become necessary.  [name] is encouraged to attend to lifestyle factors for brain health (e.g., regular physical exercise, good nutrition habits and consideration of the MIND-DASH diet, regular participation in cognitively-stimulating activities, and general stress management techniques), which are likely to have benefits for both emotional adjustment and cognition. In fact, in addition to promoting good general health, regular exercise incorporating aerobic activities (e.g., brisk walking, jogging, cycling, etc.) has been demonstrated to be a very effective treatment for depression and stress, with similar efficacy rates to both antidepressant medication and psychotherapy. Optimal control of vascular risk factors (including safe cardiovascular exercise and adherence to dietary recommendations) is encouraged. Continued participation in activities which provide mental stimulation and social interaction is also recommended.   Memory can be improved using internal strategies such as rehearsal, repetition, chunking, mnemonics, association, and imagery. External strategies such as written notes in a consistently used memory journal, visual and nonverbal auditory cues such as a calendar on the refrigerator or appointments with alarm, such as on a cell phone, can also help maximize recall.    Because she shows better recall for structured information, she will likely understand and retain new information better if it is presented to her in a meaningful or well-organized manner at the outset, such as grouping items into meaningful  categories or presenting information in an outlined, bulleted, or story format.  To address problems with fluctuating attention, she may wish to consider:   -Avoiding external distractions when needing to concentrate   -Limiting exposure to fast paced environments with multiple sensory demands   -Writing down complicated information and using checklists   -Attempting and completing one task at a time (i.e., no  multi-tasking)   -Verbalizing aloud each step of a task to maintain focus   -Taking frequent breaks during the completion of steps/tasks to avoid fatigue   -Reducing the amount of information considered at one time   -Scheduling more difficult activities for a time of day where she is usually most alert  Review of Records:   [name] was seen by [facility] Narda Amber, D.O.) on 08/25/2015 for an evaluation of memory changes. At that time, [name] generally denied memory problems outside of occasional word-finding difficulty and remembering names. She also endorsed difficulty with concentration, especially while reading. Memory-based concerns are fueled by her father and sister developing degenerative neurological conditions. ADLs were described as intact. She did report significant ongoing stress as her husband had been diagnosed with advanced Lewy Body Dementia. She also endorsed significant anxiety and takes relevant medications as necessary.    She completed a comprehensive neuropsychological evaluation Kandis Nab, Psy.D.) on 04/02/2016. Results of cognitive testing were entirely within normal limits. There were no areas of observed impairment and all performances were at least average, with many scores in the high average to very superior range. There was said to be evidence of mild generalized anxiety which appears to be longstanding in nature and improved recently with medication. There were no concerns surrounding dementia or underlying Alzheimer's disease at that  time. Overall, cognitive complaints were said to be most likely secondary to psychosocial stressors and normal aging at that time.    [name] was seen by Dr. Posey Pronto for follow-up on 05/25/2020. At that time, [name] reported worsening memory concerns. Specifically, she reported having become increasingly forgetful, such as with recipes or while reading a book. She also reported difficulty navigating in unfamiliar areas and has gotten lost when driving. ADLs were generally described as intact. Performance on a brief cognitive screening instrument (MOCA) was 20/30, down from 26/30 when seen in 2017. Ultimately, [name] was referred for a repeat neuropsychological evaluation to characterize her cognitive abilities and to assist with diagnostic clarity and treatment planning.   She completed a comprehensive neuropsychological evaluation with myself on 08/03/2020. Results suggested variability across memory measures, with notable impairment learning and later recalling list-based information. Recall was also impaired on a figure drawing task, while performance was appropriate across all aspects of a story-based memory task. Additional relative weaknesses were exhibited across response inhibition and semantic fluency. Performance was appropriate across processing speed, attention/concentration, cognitive flexibility, abstract reasoning, receptive language, phonemic fluency, confrontation naming, and visuospatial abilities. Relative to her previous evaluation, notable performance declines were seen across encoding (i.e., learning) and retrieval aspects of list-based information, retrieval of a previously learned complex figure, and performance across a visuomotor task assessing cognitive flexibility; however, the latter remained in the average normative range. A more subtle decline was exhibited across confrontation naming; however, this task also remained in the average normative range. All other performances  were stable, including strong scores across a story-based verbal memory task. ADLs were described as intact and she was diagnosed with mild neurocognitive disorder.   [name] most recently met with Dr. Posey Pronto on 04/30/2022 for follow-up. During that appointment, Ms. Champoux reportedly expressed concerns surrounding progressive memory decline and fears surrounding dementia. Functionally, she denied trouble with ADLs and performance across a brief cognitive screening task ([place]) was 24/30. She was prescribed Aricept (which later had to be discontinued due to negative side effects primarily surrounding vivid and distressing dreams) and was referred for a repeat neuropsychological evaluation.  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 Head CT on 04/04/2022 in the context of a fall was negative. It did reveal atrophy and chronic small vessel disease of unspecified severity.   Past Medical History:  Diagnosis Date    Muscle spasms of back due to scoliosis 05/27/2018   Abnormal glucose level 08/27/2019   Adjustment disorder with mixed anxiety and depressed mood 01/11/2016   Allergic rhinitis 08/12/2007   Arthralgia 07/02/2019   Asthma, mild persistent 12/19/2008   Mild persistent asthma with atopy March 2017 eosinophil count 100 cells per microliter, IgE 26 Jul 2015 pulmonary function testing ratio 86%, FEV1 2.10 L (113% predicted), FVC 2.45 (98% predicted), no change in FEV1 with bronchodilator, total lung capacity 4.57 L (93% predicted). DLCO 17.47 (76% predicted).     Atypical chest pain 02/17/2018   Bronchitis    Chronic constipation    Chronic fatigue syndrome with fibromyalgia 01/20/2016   Chronic kidney disease due to hypertension 08/27/2019   DDD (degenerative disc disease), lumbar 10/21/2017   Status post surgery x2   Dysuria 08/18/2015   Essential hypertension 08/12/2007    Gastro-esophageal reflux disease without esophagitis 08/12/2007   Generalized anxiety disorder 08/12/2007   Hardening of the aorta (main artery of the heart)    Herpes simplex labialis 04/03/2012   History of cataracts    bilateral   History of COPD 10/01/2017   History of migraine headaches    History of urinary tract infection 05/30/2010   Hyperlipidemia 08/12/2007   Insomnia 04/23/2017   Interstitial cystitis    Iron deficiency anemia 11/03/2009   Left bundle branch block 11/13/2010   Lumbar radiculopathy, chronic    Lumbar spondylosis 02/24/2020   MCI (mild cognitive impairment) 08/03/2020   Medication intolerance- statins- (make her FM much W) 01/10/2016   Medication refused- (any cholesterol ones) 01/20/2016   Metatarsalgia of left foot 04/23/2019   Osteoarthritis of knees, bilateral 11/07/2021   Pain in joint of left shoulder 08/03/2021   Pain in joint of right hip 04/04/2022   Pain in joint of right shoulder 06/25/2017   Prediabetes 01/11/2016   Primary [name] disease without gangrene 06/02/2017   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   Vitamin D deficiency 01/19/2016    Past Surgical History:  Procedure Laterality Date   ABDOMINAL HYSTERECTOMY     ANTERIOR HIP REVISION Right 09/24/2016   Procedure: RIGHT HIP ACETABULUM AND FEMORAL HEAD REVISION WITH BONE GRAFT;  Surgeon: Paralee Cancel, MD;  Location: [facility];  Service: Orthopedics;  Laterality: Right;   APPENDECTOMY     BACK SURGERY     CATARACT EXTRACTION     COLONOSCOPY     DOPPLER ECHOCARDIOGRAPHY     ESOPHAGOGASTRODUODENOSCOPY ENDOSCOPY     JOINT REPLACEMENT     LUMBAR  DISC SURGERY     NM MYOVIEW LTD     stress test   Ovarian cyst removed     ROTATOR CUFF REPAIR Left 5-6 years ago   Dr Onnie Graham; done at [facility]    TOTAL  HIP ARTHROPLASTY     TOTAL SHOULDER ARTHROPLASTY     Current Outpatient Medications:    acetaminophen (TYLENOL) 500 MG tablet, Take by mouth as needed., Disp: , Rfl:    albuterol (VENTOLIN HFA) 108 (90 Base) MCG/ACT inhaler, Inhale 2 puffs into the lungs every 6 (six) hours as needed for wheezing or shortness of breath., Disp: 1 each, Rfl: 5   ALPRAZolam (XANAX) 0.25 MG tablet, TKAE ONE-HALF TABLET BY MOUTH TWICE DAILY AS NEEDED FOR ANXIETY, Disp: 90 tablet, Rfl: 0   amLODipine (NORVASC) 5 MG tablet, TAKE 1 TABLET BY MOUTH ONCE DAILY, Disp: 90 tablet, Rfl: 3   azelastine (ASTELIN) 0.1 % nasal spray, USE 1 SPRAY IN EACH NOSTRIL TWICE DAILY, Disp: 30 mL, Rfl: 12   cetirizine (ZYRTEC) 10 MG tablet, Take 10 mg by mouth daily. , Disp: , Rfl:    Cholecalciferol (VITAMIN D3) 5000 units TABS, 5,000 IU OTC vitamin D3 daily., Disp: 90 tablet, Rfl: 3   Cyanocobalamin (VITAMIN B-12) 2500 MCG TABS, Take 1,250 mcg by mouth daily., Disp: , Rfl:    docusate sodium (COLACE) 100 MG capsule, Take 100 mg by mouth daily as needed., Disp: , Rfl:    donepezil (ARICEPT) 10 MG tablet, Take half tablet x 4 weeks, then increase to 1 tablet daily. (Patient not taking: Reported on 05/29/2022), Disp: 30 tablet, Rfl: 11   DULoxetine (CYMBALTA) 30 MG capsule, Take 1 capsule (30 mg total) by mouth daily., Disp: 90 capsule, Rfl: 0   DULoxetine (CYMBALTA) 60 MG capsule, Take 60 mg by mouth daily. Take with 30 mg tablet, Disp: , Rfl:    fluticasone (FLONASE) 50 MCG/ACT nasal spray, USE 2 SPRAYS INTO EACH NOSTRIL EVERY DAY, Disp: 48 g, Rfl: 3   losartan (COZAAR) 100 MG tablet, TAKE 1 TABLET BY MOUTH DAILY, Disp: 90 tablet, Rfl: 1   mirabegron ER (MYRBETRIQ) 25 MG TB24 tablet, 1 tablet Orally Once a day, Disp: , Rfl:    montelukast (SINGULAIR) 10 MG tablet, Take 1 tablet  (10 mg total) by mouth at bedtime., Disp: 90 tablet, Rfl: 2   omeprazole (PRILOSEC) 20 MG capsule, TAKE 1 CAPSULE BY MOUTH DAILY, Disp: 90 capsule, Rfl: 1   silver sulfADIAZINE (SILVADENE) 1 % cream, Apply 1 Application topically daily., Disp: 50 g, Rfl: 0   SYMBICORT 80-4.5 MCG/ACT inhaler, INHALE 2 PUFFS BY MOUTH INTO THE LUNGS TWICE A DAY, Disp: 1 each, Rfl: 11  Clinical Interview:   The following information was obtained during a clinical interview with Ms. Carucci and her daughter prior to cognitive testing.  Cognitive Symptoms: Decreased short-term memory: Endorsed. She previously reported being more forgetful surrounding recollection of dates which she feels are important. She also reported trouble recalling names, as well as misplacing things around her residence. Her daughter previously added her perception that memory lapses more frequently occur during periods of higher back pain. Presently, both Ms. Overfield and her daughter denied their perception of notable cognitive decline and generally described ongoing stability. Her daughter did note some greater confusion and repetition in the evenings, largely attributed to the combination of pain and fatigue.  Decreased long-term memory: Denied. Decreased attention/concentration: Generally denied. She previously noted that her "mind will wander" from time to time but that she is generally able to pay attention well when desired. She denied increased distractibility. This was said to be stable.  Reduced processing speed: Denied. Difficulties with executive functions: Denied. She is generally able to make decisions but may seek out a second  opinion when she feels it necessary. Trouble with impulsivity or any recent personality changes were denied. This was said to be stable.  Difficulties with emotion regulation: Denied. Difficulties with receptive language: Denied. Difficulties with word finding: Endorsed "sometimes." This was said to be stable.   Decreased visuoperceptual ability: Denied.   Difficulties completing ADLs: Denied. She manages her medications and personal finances independently without issue. Her daughter was in agreement with this. They also denied any current driving-related concerns.   Additional Medical History: History of traumatic brain injury/concussion: Denied. History of stroke: Denied. History of seizure activity: Denied. History of known exposure to toxins: Denied. Symptoms of chronic pain: Endorsed. She previously reported longstanding, chronic back pain attributed to scoliosis. Her and her daughter also stated that pain symptoms can be distracting and influence cognitive abilities. Presently, she and her daughter described prominent and debilitating diffuse arthritic symptoms which are present "24/7."  Experience of frequent headaches/migraines: Denied. Frequent instances of dizziness/vertigo: Denied.   Sensory changes: She wears glasses with positive effect. She also previously reported a diminished sense of taste, which she attributed to medication side effects. Her daughter noted a more pronounced diminished sense of smell and that she has noticed that her mother will favor foods with greater spice content due to normal foods tasting bland. Hearing-related difficulties were denied.  Balance/coordination difficulties: Endorsed. Balance difficulties were attributed to chronic pain, scoliosis, and that her left leg is shorter than her right. She described two recent falls, one in January 2024 due to momentum carrying her forward quicker than she was able to react, and another in March 2024 when trying to arise from a chair with wheels which unfortunately moved out from underneath her.  Other motor difficulties: Denied.  Sleep History: Estimated hours obtained each night: 6-7 hours.  Difficulties falling asleep: Denied. However, without the use of long-acting Advil before bed, pain symptoms will prevent her from  falling asleep.  Difficulties staying asleep: Endorsed. She reported waking up to use the restroom and will occasionally have trouble falling back asleep. She also noted that if pain symptoms are not managed well, she will frequently wake throughout the night due to ongoing discomfort.   Feels rested and refreshed upon awakening: Denied.   History of snoring: Denied. History of waking up gasping for air: Denied. Witnessed breath cessation while asleep: Denied.   History of vivid dreaming: Denied. Excessive movement while asleep: Denied. Instances of acting out her dreams: Denied.  Psychiatric/Behavioral Health History: Depression: Acute mood symptoms were said to generally be pain dependant. Ms. Shallcross previously stated that "when the pain is good, I'm good but when it disrupts what I need to do, I get frustrated." She again identified with this sentiment during the current interview. She denied to her knowledge ever being formally diagnosed with major depressive disorder. Records do suggest a prior adjustment disorder with anxious distress and depressed mood around the passing of her husband. Current or remote suicidal ideation, intent, or plan was denied.  Anxiety: She previously acknowledged a longstanding history of generalized anxiety and takes 0.25mg  dose of Xanax in the morning and evening with positive effect. Her daughter previously noted that if these doses are missed, her mother will experience chest pains and other physical manifestations of anxiety.  Mania: Denied. Trauma History: Denied. Visual/auditory hallucinations: Denied. Delusional thoughts: Denied.   Tobacco: Denied. Alcohol: She denied current alcohol consumption and denied a history of problematic alcohol abuse or dependence.  Recreational drugs: Denied.  Family History: Problem Relation  Age of Onset   Arthritis Mother    Hypertension Mother    Heart disease Father    Arthritis Father    Hypertension Father     Memory loss Father        likely Alzheimer's disease; symptom onset in 8s   Depression Sister    Hyperlipidemia Sister    Hypertension Other    Hyperlipidemia Other    Parkinson's disease Sister    Memory loss Sister    This information was confirmed by Ms. Zumbro.  Academic/Vocational History: Highest level of educational attainment: 18 years. She earned a Dietitian from [employer] in Education officer, museum, Cabin crew, and physics. She then earned a Master's degree in Personnel officer. She described herself as a good (A/B) student in academic settings. 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  History of developmental delay: Denied. History of grade repetition: Denied. Enrollment in special education courses: Denied. History of LD/ADHD: Denied.   Employment: Retired. 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   Evaluation Results:   Behavioral Observations: Ms. Fron was accompanied by her daughter, arrived to her appointment on time, and was appropriately dressed and groomed. She appeared alert and oriented. She ambulated slowly and with the assistance of a cane. No frank instances of instability were noted. Gross motor functioning appeared intact upon informal observation and no abnormal movements (e.g., tremors) were noted. Her affect was generally relaxed and positive. Spontaneous speech was fluent and word finding difficulties were not observed during the clinical interview. Thought processes were coherent, organized, and normal in content. Insight into her cognitive difficulties appeared adequate.   During testing, sustained attention was appropriate. Task engagement was adequate and she persisted when  challenged. Overall, Ms. Fencl was cooperative with the clinical interview and subsequent testing procedures.   Adequacy of Effort: The validity of neuropsychological testing is limited by the extent to which the individual being tested may be assumed to have exerted adequate effort during testing. Ms. Etzel expressed her intention to perform to the best of her abilities and exhibited adequate task engagement and persistence. Scores across stand-alone and embedded performance validity measures were within expectation. As such, the results of the current evaluation are believed to be a valid representation of Ms. Oyama's current cognitive functioning.  Test Results: Ms. Minteer was largely oriented at the time of the current evaluation. She was three days off when stating the current date.   Intellectual abilities based upon educational and vocational attainment were estimated to be in the average to above average range. Premorbid abilities were estimated to be within the average range based upon a single-word reading test.   Processing speed was average. Basic attention was exceptionally high. More complex attention (e.g., working memory) was above average. Executive functioning was below average to above average.  While not directly assessed, receptive language abilities were believed to be intact. Ms. Dugar did not exhibit any difficulties comprehending task instructions and answered all questions asked of her appropriately. Assessed expressive language (e.g., verbal fluency and confrontation naming) was mildly variable but overall appropriate, ranging from the below average to well above average normative ranges.     Assessed visuospatial/visuoconstructional abilities were below average to above average. Points were lost on her  drawing of a clock due to incorrect hand placement. Points were lost on her copy of a complex figure due to a few mild visual distortions and one internal aspect  being omitted entirely.    Learning (i.e., encoding) of novel verbal information was average. Spontaneous delayed recall (i.e., retrieval) of previously learned information was average across story-based content but exceptionally low across list and figure-based tasks. Retention rates were 40% across a story learning task, 0% (spontaneous) to 67% (cued) across a list learning task, and 0% across a figure drawing task. Performance across recognition tasks was above average across a story-based task but exceptionally low across two other tasks, suggesting limited evidence for information consolidation.   Results of emotional screening instruments suggested that recent symptoms of generalized anxiety were in the moderate range, while symptoms of depression were within the mild range. A screening instrument assessing recent sleep quality suggested the presence of minimal sleep dysfunction.  Tables of Scores:   Note: This summary of test scores accompanies the interpretive report and should not be considered in isolation without reference to the appropriate sections in the text. Descriptors are based on appropriate normative data and may be adjusted based on clinical judgment. Terms such as "Within Normal Limits" and "Outside Normal Limits" are used when a more specific description of the test score cannot be determined. Descriptors refer to the current evaluation only.          Percentile - Normative Descriptor > 98 - Exceptionally High 91-97 - Well Above Average 75-90 - Above Average 25-74 - Average 9-24 - Below Average 2-8 - Well Below Average < 2 - Exceptionally Low         Orientation: January 2018 May 2022 Current  DESCRIPTOR   Raw Score Raw Score Raw Score Percentile   NAB Orientation, Form 1 --- 27/29 27/29 --- ---         Cognitive Screening:        Raw Score Raw Score Raw Score Percentile   SLUMS: --- 17/30 20/30 --- ---         Intellectual Functioning:        Standard Score  Standard Score Standard Score Percentile   Test of Premorbid Functioning: 104 108 99 47 Average         Memory:       Wechsler Memory Scale (WMS-IV):                       Raw Score Raw Score (Scaled Score) Raw Score (Scaled Score) Percentile     Logical Memory I 31/50 26/53 (10) 23/53 (9) 37 Average    Logical Memory II 25/50 17/39 (12) 8/39 (9) 37 Average      Logical Memory Recognition 26/30 19/23 20/23  >75 Above Average  *Task administration during her 2018 evaluation was incorrect (patient given adult rather than older adult version)            Wisconsin Verbal Learning Test (CVLT-III) Brief Form: Raw Score Raw Score (Scaled/Standard Score) Raw Score (Scaled/Standard Score) Percentile     Total Trials 1-4 27/36 17/36 (77) 21/36 (92) 30 Average    Short-Delay Free Recall 7/9 4/9 (5) 3/9 (3) 1 Exceptionally Low    Long-Delay Free Recall 7/9 0/9 (1) 0/9 (1) <1 Exceptionally Low    Long-Delay Cued Recall 8/9 3/9 (3) 4/9 (4) 2 Well Below Average      Recognition Hits 9/9 8/9 (10) 9/9 (13) 84 Above Average  False Positive Errors 1 1 (10) 8 (1) <1 Exceptionally Low          Scaled Score Raw Score (Scaled Score) Raw Score (Scaled Score) Percentile   RBANS Figure Copy: 15/20 20/20 (14) 15/20 (6) 9 Below Average  RBANS Figure Recall: 10/20 4/20 (5) 0/20 (1) <1 Exceptionally Low    RBANS Figure Recognition: --- 4/8 2/8 1-5 Well Below Average         Attention/Executive Function:       Trail Making Test (TMT): Raw Score Raw Score (T Score) Raw Score (T Score) Percentile     Part A 23 secs.,  0 errors 43 secs.,  1 error (45) 34 secs.,  0 errors (49) 46 Average    Part B 66 secs.,  0 errors 122 secs.,  5 errors (44) 121 secs.,  3 errors (44) 27 Average           Scaled Score Scaled Score Scaled Score Percentile   WAIS-IV Coding: 14 13 11  63 Average          Scaled Score Scaled Score Scaled Score Percentile   WAIS-IV Digit Span: --- 15 15 95 Well Above Average    Forward  17 17 18  >99 Exceptionally High    Backward 15 14 12  75 Above Average    Sequencing --- 14 13 84 Above Average          Scaled Score Scaled Score Scaled Score Percentile   WAIS-IV Similarities: 11 10 13  84 Above Average         D-KEFS Color-Word Interference Test: Raw Score (Scaled Score) Raw Score (Scaled Score) Raw Score (Scaled Score) Percentile     Color Naming --- 36 secs. (10) 33 secs. (11) 63 Average    Word Reading --- 21 secs. (13) 24 secs. (11) 63 Average    Inhibition --- 94 secs. (10) 88 secs. (10) 50 Average      Total Errors --- 10 errors (5) 7 errors (7) 16 Below Average    Inhibition/Switching --- 144 secs. (5) 117 secs. (8) 25 Average      Total Errors --- 8 errors (6) 6 errors (8) 25 Average         Language:       Verbal Fluency Test: Raw Score Raw Score (T Score) Raw Score (T Score) Percentile     Phonemic Fluency (FAS) 49 48 (53) 43 (50) 50 Average    Animal Fluency 15 16 (42) 16 (42) 21 Below Average          NAB Language Module, Form 1: T Score T Score T Score Percentile     Naming --- 28/31 (44) 31/31 (64) 92 Well Above Average         Visuospatial/Visuoconstruction:        Raw Score Raw Score Raw Score Percentile   Clock Drawing: WNL 10/10 8/10 --- Within Normal Limits          Scaled Score Scaled Score Scaled Score Percentile   WAIS-IV Block Design: 11 11 12  75 Above Average         Mood and Personality:        Raw Score Raw Score Raw Score Percentile   Geriatric Depression Scale: --- 16 14 --- Mild  Geriatric Anxiety Scale: --- 20 22 --- Moderate    Somatic --- 9 12 --- Moderate    Cognitive --- 3 7 --- Moderate    Affective --- 8 3 --- Minimal  Additional Questionnaires:        Raw Score Raw Score Raw Score Percentile   PROMIS Sleep Disturbance Questionnaire: --- 20 22 --- None to Slight   Informed Consent and Coding/Compliance:   The current evaluation represents a clinical evaluation for the purposes previously outlined by the  referral source and is in no way reflective of a forensic evaluation.   Ms. Homewood was provided with a verbal description of the nature and purpose of the present neuropsychological evaluation. Also reviewed were the foreseeable risks and/or discomforts and benefits of the procedure, limits of confidentiality, and mandatory reporting requirements of this provider. The patient was given the opportunity to ask questions and receive answers about the evaluation. Oral consent to participate was provided by the patient.   This evaluation was conducted by Christia Reading, Ph.D., ABPP-CN, board certified clinical neuropsychologist. Ms. Ullman completed a clinical interview with Dr. Melvyn Novas, billed as one unit [phone], and 110 minutes of cognitive testing and scoring, billed as one unit [phone] and three additional units [id number]. Psychometrist Milana Kidney, B.S., assisted Dr. Melvyn Novas with test administration and scoring procedures. As a separate and discrete service, Dr. Melvyn Novas spent a total of 160 minutes in interpretation and report writing billed as one unit [phone] and two units [id number].

## 2022-06-25 DIAGNOSIS — M9901 Segmental and somatic dysfunction of cervical region: Secondary | ICD-10-CM | POA: Diagnosis not present

## 2022-06-25 DIAGNOSIS — M25562 Pain in left knee: Secondary | ICD-10-CM | POA: Diagnosis not present

## 2022-06-25 DIAGNOSIS — M25561 Pain in right knee: Secondary | ICD-10-CM | POA: Diagnosis not present

## 2022-06-25 DIAGNOSIS — M5032 Other cervical disc degeneration, mid-cervical region, unspecified level: Secondary | ICD-10-CM | POA: Diagnosis not present

## 2022-06-27 ENCOUNTER — Ambulatory Visit (INDEPENDENT_AMBULATORY_CARE_PROVIDER_SITE_OTHER): Payer: Medicare Other | Admitting: Psychology

## 2022-06-27 DIAGNOSIS — M13862 Other specified arthritis, left knee: Secondary | ICD-10-CM | POA: Diagnosis not present

## 2022-06-27 DIAGNOSIS — M13861 Other specified arthritis, right knee: Secondary | ICD-10-CM | POA: Diagnosis not present

## 2022-06-27 DIAGNOSIS — G3184 Mild cognitive impairment, so stated: Secondary | ICD-10-CM | POA: Diagnosis not present

## 2022-06-27 DIAGNOSIS — M797 Fibromyalgia: Secondary | ICD-10-CM | POA: Diagnosis not present

## 2022-06-27 DIAGNOSIS — G9332 Myalgic encephalomyelitis/chronic fatigue syndrome: Secondary | ICD-10-CM

## 2022-06-27 NOTE — Progress Notes (Signed)
   Neuropsychology Feedback Session [name]. [place] [facility] Neu[facility]  Reason for Referral:   [name] is a 89 y.o. right-handed Caucasian female referred by Narda Amber, D.O., to characterize her current cognitive functioning and assist with diagnostic clarity and treatment planning in the context of subjective cognitive dysfunction.   Feedback:   [name] completed a comprehensive neuropsychological evaluation on 06/20/2022. Please refer to that encounter for the full report and recommendations. Briefly, results suggested performance variability across memory measures, with notable impairment with delayed retrieval aspects across list and figure-based memory tasks. Performance was appropriate across all aspects of a story-based memory task. Performances were adequate relative to age-matched peers across all other assessed cognitive domains. Relative to her previous evaluations in January 2018 and May 2022, there has been evidence for very slow, gradual decline across list and figure-based learning and memory, particularly aspects of delayed recall and recognition/consolidation. Story-based memory has remained largely stable and appropriate over time. General stability can also be seen across all non-memory cognitive domains over the years.    [name] was accompanied by her daughter during the current feedback session. Content of the current session focused on the results of her neuropsychological evaluation. [name] was given the opportunity to ask questions and her questions were answered. She was encouraged to reach out should additional questions arise. A copy of her report was provided at the conclusion of the visit.      Greater than 31 minutes were spent preparing for, conducting, and documenting the current feedback session with [name], billed as one unit [phone].

## 2022-07-02 DIAGNOSIS — M13861 Other specified arthritis, right knee: Secondary | ICD-10-CM | POA: Diagnosis not present

## 2022-07-02 DIAGNOSIS — M9901 Segmental and somatic dysfunction of cervical region: Secondary | ICD-10-CM | POA: Diagnosis not present

## 2022-07-02 DIAGNOSIS — M5032 Other cervical disc degeneration, mid-cervical region, unspecified level: Secondary | ICD-10-CM | POA: Diagnosis not present

## 2022-07-02 DIAGNOSIS — M13862 Other specified arthritis, left knee: Secondary | ICD-10-CM | POA: Diagnosis not present

## 2022-07-04 DIAGNOSIS — M25561 Pain in right knee: Secondary | ICD-10-CM | POA: Diagnosis not present

## 2022-07-04 DIAGNOSIS — M25562 Pain in left knee: Secondary | ICD-10-CM | POA: Diagnosis not present

## 2022-07-09 DIAGNOSIS — M25562 Pain in left knee: Secondary | ICD-10-CM | POA: Diagnosis not present

## 2022-07-09 DIAGNOSIS — M9905 Segmental and somatic dysfunction of pelvic region: Secondary | ICD-10-CM | POA: Diagnosis not present

## 2022-07-09 DIAGNOSIS — M5136 Other intervertebral disc degeneration, lumbar region: Secondary | ICD-10-CM | POA: Diagnosis not present

## 2022-07-09 DIAGNOSIS — M9904 Segmental and somatic dysfunction of sacral region: Secondary | ICD-10-CM | POA: Diagnosis not present

## 2022-07-09 DIAGNOSIS — M25561 Pain in right knee: Secondary | ICD-10-CM | POA: Diagnosis not present

## 2022-07-09 DIAGNOSIS — M9903 Segmental and somatic dysfunction of lumbar region: Secondary | ICD-10-CM | POA: Diagnosis not present

## 2022-07-10 DIAGNOSIS — M170 Bilateral primary osteoarthritis of knee: Secondary | ICD-10-CM | POA: Diagnosis not present

## 2022-07-11 DIAGNOSIS — M13862 Other specified arthritis, left knee: Secondary | ICD-10-CM | POA: Diagnosis not present

## 2022-07-11 DIAGNOSIS — M13861 Other specified arthritis, right knee: Secondary | ICD-10-CM | POA: Diagnosis not present

## 2022-07-13 ENCOUNTER — Other Ambulatory Visit: Payer: Self-pay | Admitting: Pulmonary Disease

## 2022-07-13 DIAGNOSIS — F410 Panic disorder [episodic paroxysmal anxiety] without agoraphobia: Secondary | ICD-10-CM

## 2022-07-16 DIAGNOSIS — M170 Bilateral primary osteoarthritis of knee: Secondary | ICD-10-CM | POA: Diagnosis not present

## 2022-07-17 DIAGNOSIS — M5136 Other intervertebral disc degeneration, lumbar region: Secondary | ICD-10-CM | POA: Diagnosis not present

## 2022-07-17 DIAGNOSIS — M9903 Segmental and somatic dysfunction of lumbar region: Secondary | ICD-10-CM | POA: Diagnosis not present

## 2022-07-17 DIAGNOSIS — M9904 Segmental and somatic dysfunction of sacral region: Secondary | ICD-10-CM | POA: Diagnosis not present

## 2022-07-17 DIAGNOSIS — H01024 Squamous blepharitis left upper eyelid: Secondary | ICD-10-CM | POA: Diagnosis not present

## 2022-07-17 DIAGNOSIS — M170 Bilateral primary osteoarthritis of knee: Secondary | ICD-10-CM | POA: Diagnosis not present

## 2022-07-17 DIAGNOSIS — H353131 Nonexudative age-related macular degeneration, bilateral, early dry stage: Secondary | ICD-10-CM | POA: Diagnosis not present

## 2022-07-17 DIAGNOSIS — H35372 Puckering of macula, left eye: Secondary | ICD-10-CM | POA: Diagnosis not present

## 2022-07-17 DIAGNOSIS — H04123 Dry eye syndrome of bilateral lacrimal glands: Secondary | ICD-10-CM | POA: Diagnosis not present

## 2022-07-17 DIAGNOSIS — Z961 Presence of intraocular lens: Secondary | ICD-10-CM | POA: Diagnosis not present

## 2022-07-17 DIAGNOSIS — H40013 Open angle with borderline findings, low risk, bilateral: Secondary | ICD-10-CM | POA: Diagnosis not present

## 2022-07-17 DIAGNOSIS — M9905 Segmental and somatic dysfunction of pelvic region: Secondary | ICD-10-CM | POA: Diagnosis not present

## 2022-07-17 DIAGNOSIS — H01021 Squamous blepharitis right upper eyelid: Secondary | ICD-10-CM | POA: Diagnosis not present

## 2022-07-18 DIAGNOSIS — M13861 Other specified arthritis, right knee: Secondary | ICD-10-CM | POA: Diagnosis not present

## 2022-07-18 DIAGNOSIS — M13862 Other specified arthritis, left knee: Secondary | ICD-10-CM | POA: Diagnosis not present

## 2022-07-22 ENCOUNTER — Other Ambulatory Visit (HOSPITAL_COMMUNITY): Payer: Self-pay

## 2022-07-24 DIAGNOSIS — M5136 Other intervertebral disc degeneration, lumbar region: Secondary | ICD-10-CM | POA: Diagnosis not present

## 2022-07-24 DIAGNOSIS — M170 Bilateral primary osteoarthritis of knee: Secondary | ICD-10-CM | POA: Diagnosis not present

## 2022-07-24 DIAGNOSIS — M9903 Segmental and somatic dysfunction of lumbar region: Secondary | ICD-10-CM | POA: Diagnosis not present

## 2022-07-24 DIAGNOSIS — M9905 Segmental and somatic dysfunction of pelvic region: Secondary | ICD-10-CM | POA: Diagnosis not present

## 2022-07-24 DIAGNOSIS — M9904 Segmental and somatic dysfunction of sacral region: Secondary | ICD-10-CM | POA: Diagnosis not present

## 2022-07-31 DIAGNOSIS — M533 Sacrococcygeal disorders, not elsewhere classified: Secondary | ICD-10-CM | POA: Diagnosis not present

## 2022-08-07 DIAGNOSIS — M5136 Other intervertebral disc degeneration, lumbar region: Secondary | ICD-10-CM | POA: Diagnosis not present

## 2022-08-07 DIAGNOSIS — M9904 Segmental and somatic dysfunction of sacral region: Secondary | ICD-10-CM | POA: Diagnosis not present

## 2022-08-07 DIAGNOSIS — M9905 Segmental and somatic dysfunction of pelvic region: Secondary | ICD-10-CM | POA: Diagnosis not present

## 2022-08-07 DIAGNOSIS — M9903 Segmental and somatic dysfunction of lumbar region: Secondary | ICD-10-CM | POA: Diagnosis not present

## 2022-08-12 DIAGNOSIS — M25511 Pain in right shoulder: Secondary | ICD-10-CM | POA: Diagnosis not present

## 2022-08-12 DIAGNOSIS — M25512 Pain in left shoulder: Secondary | ICD-10-CM | POA: Diagnosis not present

## 2022-08-21 DIAGNOSIS — M9903 Segmental and somatic dysfunction of lumbar region: Secondary | ICD-10-CM | POA: Diagnosis not present

## 2022-08-21 DIAGNOSIS — M5136 Other intervertebral disc degeneration, lumbar region: Secondary | ICD-10-CM | POA: Diagnosis not present

## 2022-08-21 DIAGNOSIS — M9904 Segmental and somatic dysfunction of sacral region: Secondary | ICD-10-CM | POA: Diagnosis not present

## 2022-08-21 DIAGNOSIS — M9905 Segmental and somatic dysfunction of pelvic region: Secondary | ICD-10-CM | POA: Diagnosis not present

## 2022-09-04 DIAGNOSIS — M5136 Other intervertebral disc degeneration, lumbar region: Secondary | ICD-10-CM | POA: Diagnosis not present

## 2022-09-04 DIAGNOSIS — M9904 Segmental and somatic dysfunction of sacral region: Secondary | ICD-10-CM | POA: Diagnosis not present

## 2022-09-04 DIAGNOSIS — M9905 Segmental and somatic dysfunction of pelvic region: Secondary | ICD-10-CM | POA: Diagnosis not present

## 2022-09-04 DIAGNOSIS — M9903 Segmental and somatic dysfunction of lumbar region: Secondary | ICD-10-CM | POA: Diagnosis not present

## 2022-09-18 DIAGNOSIS — M9903 Segmental and somatic dysfunction of lumbar region: Secondary | ICD-10-CM | POA: Diagnosis not present

## 2022-09-18 DIAGNOSIS — M9905 Segmental and somatic dysfunction of pelvic region: Secondary | ICD-10-CM | POA: Diagnosis not present

## 2022-09-18 DIAGNOSIS — M5136 Other intervertebral disc degeneration, lumbar region: Secondary | ICD-10-CM | POA: Diagnosis not present

## 2022-09-18 DIAGNOSIS — M9904 Segmental and somatic dysfunction of sacral region: Secondary | ICD-10-CM | POA: Diagnosis not present

## 2022-09-20 ENCOUNTER — Other Ambulatory Visit: Payer: Self-pay | Admitting: Pulmonary Disease

## 2022-09-25 DIAGNOSIS — M5136 Other intervertebral disc degeneration, lumbar region: Secondary | ICD-10-CM | POA: Diagnosis not present

## 2022-09-25 DIAGNOSIS — M9903 Segmental and somatic dysfunction of lumbar region: Secondary | ICD-10-CM | POA: Diagnosis not present

## 2022-09-25 DIAGNOSIS — M9904 Segmental and somatic dysfunction of sacral region: Secondary | ICD-10-CM | POA: Diagnosis not present

## 2022-09-25 DIAGNOSIS — M9905 Segmental and somatic dysfunction of pelvic region: Secondary | ICD-10-CM | POA: Diagnosis not present

## 2022-10-02 DIAGNOSIS — M9904 Segmental and somatic dysfunction of sacral region: Secondary | ICD-10-CM | POA: Diagnosis not present

## 2022-10-02 DIAGNOSIS — M5136 Other intervertebral disc degeneration, lumbar region: Secondary | ICD-10-CM | POA: Diagnosis not present

## 2022-10-02 DIAGNOSIS — M9905 Segmental and somatic dysfunction of pelvic region: Secondary | ICD-10-CM | POA: Diagnosis not present

## 2022-10-02 DIAGNOSIS — M9903 Segmental and somatic dysfunction of lumbar region: Secondary | ICD-10-CM | POA: Diagnosis not present

## 2022-10-09 DIAGNOSIS — M9905 Segmental and somatic dysfunction of pelvic region: Secondary | ICD-10-CM | POA: Diagnosis not present

## 2022-10-09 DIAGNOSIS — M9904 Segmental and somatic dysfunction of sacral region: Secondary | ICD-10-CM | POA: Diagnosis not present

## 2022-10-09 DIAGNOSIS — M5136 Other intervertebral disc degeneration, lumbar region: Secondary | ICD-10-CM | POA: Diagnosis not present

## 2022-10-09 DIAGNOSIS — M9903 Segmental and somatic dysfunction of lumbar region: Secondary | ICD-10-CM | POA: Diagnosis not present

## 2022-10-10 ENCOUNTER — Other Ambulatory Visit: Payer: Self-pay | Admitting: Pulmonary Disease

## 2022-10-10 DIAGNOSIS — F410 Panic disorder [episodic paroxysmal anxiety] without agoraphobia: Secondary | ICD-10-CM

## 2022-10-10 NOTE — Telephone Encounter (Signed)
Received a refill request for patient's Xanax. RX was last refilled on 07/15/22 for 90 tablets. She was last seen on 05/29/2022 and advised to follow up in 6 months.   Dr. Judeth Horn, please advise if you are ok with this refill.

## 2022-10-14 DIAGNOSIS — M9904 Segmental and somatic dysfunction of sacral region: Secondary | ICD-10-CM | POA: Diagnosis not present

## 2022-10-14 DIAGNOSIS — M9903 Segmental and somatic dysfunction of lumbar region: Secondary | ICD-10-CM | POA: Diagnosis not present

## 2022-10-14 DIAGNOSIS — M5136 Other intervertebral disc degeneration, lumbar region: Secondary | ICD-10-CM | POA: Diagnosis not present

## 2022-10-14 DIAGNOSIS — M9905 Segmental and somatic dysfunction of pelvic region: Secondary | ICD-10-CM | POA: Diagnosis not present

## 2022-10-22 DIAGNOSIS — M9903 Segmental and somatic dysfunction of lumbar region: Secondary | ICD-10-CM | POA: Diagnosis not present

## 2022-10-22 DIAGNOSIS — M9904 Segmental and somatic dysfunction of sacral region: Secondary | ICD-10-CM | POA: Diagnosis not present

## 2022-10-22 DIAGNOSIS — M5136 Other intervertebral disc degeneration, lumbar region: Secondary | ICD-10-CM | POA: Diagnosis not present

## 2022-10-22 DIAGNOSIS — M9905 Segmental and somatic dysfunction of pelvic region: Secondary | ICD-10-CM | POA: Diagnosis not present

## 2022-10-29 DIAGNOSIS — M9903 Segmental and somatic dysfunction of lumbar region: Secondary | ICD-10-CM | POA: Diagnosis not present

## 2022-10-29 DIAGNOSIS — M9905 Segmental and somatic dysfunction of pelvic region: Secondary | ICD-10-CM | POA: Diagnosis not present

## 2022-10-29 DIAGNOSIS — M9904 Segmental and somatic dysfunction of sacral region: Secondary | ICD-10-CM | POA: Diagnosis not present

## 2022-10-29 DIAGNOSIS — M5136 Other intervertebral disc degeneration, lumbar region: Secondary | ICD-10-CM | POA: Diagnosis not present

## 2022-11-04 DIAGNOSIS — I129 Hypertensive chronic kidney disease with stage 1 through stage 4 chronic kidney disease, or unspecified chronic kidney disease: Secondary | ICD-10-CM | POA: Diagnosis not present

## 2022-11-04 DIAGNOSIS — M79642 Pain in left hand: Secondary | ICD-10-CM | POA: Diagnosis not present

## 2022-11-05 DIAGNOSIS — M5136 Other intervertebral disc degeneration, lumbar region: Secondary | ICD-10-CM | POA: Diagnosis not present

## 2022-11-05 DIAGNOSIS — M9903 Segmental and somatic dysfunction of lumbar region: Secondary | ICD-10-CM | POA: Diagnosis not present

## 2022-11-05 DIAGNOSIS — M9904 Segmental and somatic dysfunction of sacral region: Secondary | ICD-10-CM | POA: Diagnosis not present

## 2022-11-05 DIAGNOSIS — M9905 Segmental and somatic dysfunction of pelvic region: Secondary | ICD-10-CM | POA: Diagnosis not present

## 2022-11-06 DIAGNOSIS — N3942 Incontinence without sensory awareness: Secondary | ICD-10-CM | POA: Diagnosis not present

## 2022-11-06 DIAGNOSIS — R350 Frequency of micturition: Secondary | ICD-10-CM | POA: Diagnosis not present

## 2022-11-06 DIAGNOSIS — N3946 Mixed incontinence: Secondary | ICD-10-CM | POA: Diagnosis not present

## 2022-11-15 DIAGNOSIS — E559 Vitamin D deficiency, unspecified: Secondary | ICD-10-CM | POA: Diagnosis not present

## 2022-11-15 DIAGNOSIS — E785 Hyperlipidemia, unspecified: Secondary | ICD-10-CM | POA: Diagnosis not present

## 2022-11-15 DIAGNOSIS — I129 Hypertensive chronic kidney disease with stage 1 through stage 4 chronic kidney disease, or unspecified chronic kidney disease: Secondary | ICD-10-CM | POA: Diagnosis not present

## 2022-11-15 DIAGNOSIS — R7309 Other abnormal glucose: Secondary | ICD-10-CM | POA: Diagnosis not present

## 2022-11-15 DIAGNOSIS — R7989 Other specified abnormal findings of blood chemistry: Secondary | ICD-10-CM | POA: Diagnosis not present

## 2022-11-22 DIAGNOSIS — Z1339 Encounter for screening examination for other mental health and behavioral disorders: Secondary | ICD-10-CM | POA: Diagnosis not present

## 2022-11-22 DIAGNOSIS — F339 Major depressive disorder, recurrent, unspecified: Secondary | ICD-10-CM | POA: Diagnosis not present

## 2022-11-22 DIAGNOSIS — N182 Chronic kidney disease, stage 2 (mild): Secondary | ICD-10-CM | POA: Diagnosis not present

## 2022-11-22 DIAGNOSIS — Z23 Encounter for immunization: Secondary | ICD-10-CM | POA: Diagnosis not present

## 2022-11-22 DIAGNOSIS — G3184 Mild cognitive impairment, so stated: Secondary | ICD-10-CM | POA: Diagnosis not present

## 2022-11-22 DIAGNOSIS — Z1331 Encounter for screening for depression: Secondary | ICD-10-CM | POA: Diagnosis not present

## 2022-11-22 DIAGNOSIS — E785 Hyperlipidemia, unspecified: Secondary | ICD-10-CM | POA: Diagnosis not present

## 2022-11-22 DIAGNOSIS — I129 Hypertensive chronic kidney disease with stage 1 through stage 4 chronic kidney disease, or unspecified chronic kidney disease: Secondary | ICD-10-CM | POA: Diagnosis not present

## 2022-11-22 DIAGNOSIS — E538 Deficiency of other specified B group vitamins: Secondary | ICD-10-CM | POA: Diagnosis not present

## 2022-11-22 DIAGNOSIS — I700 Atherosclerosis of aorta: Secondary | ICD-10-CM | POA: Diagnosis not present

## 2022-11-22 DIAGNOSIS — R7303 Prediabetes: Secondary | ICD-10-CM | POA: Diagnosis not present

## 2022-11-22 DIAGNOSIS — G8929 Other chronic pain: Secondary | ICD-10-CM | POA: Diagnosis not present

## 2022-11-22 DIAGNOSIS — J4530 Mild persistent asthma, uncomplicated: Secondary | ICD-10-CM | POA: Diagnosis not present

## 2022-11-22 DIAGNOSIS — Z0000 Encounter for general adult medical examination without abnormal findings: Secondary | ICD-10-CM | POA: Diagnosis not present

## 2022-11-26 DIAGNOSIS — M9905 Segmental and somatic dysfunction of pelvic region: Secondary | ICD-10-CM | POA: Diagnosis not present

## 2022-11-26 DIAGNOSIS — M9904 Segmental and somatic dysfunction of sacral region: Secondary | ICD-10-CM | POA: Diagnosis not present

## 2022-11-26 DIAGNOSIS — M5136 Other intervertebral disc degeneration, lumbar region: Secondary | ICD-10-CM | POA: Diagnosis not present

## 2022-11-26 DIAGNOSIS — M9903 Segmental and somatic dysfunction of lumbar region: Secondary | ICD-10-CM | POA: Diagnosis not present

## 2022-12-03 DIAGNOSIS — M6281 Muscle weakness (generalized): Secondary | ICD-10-CM | POA: Diagnosis not present

## 2022-12-03 DIAGNOSIS — Z9181 History of falling: Secondary | ICD-10-CM | POA: Diagnosis not present

## 2022-12-03 DIAGNOSIS — Z23 Encounter for immunization: Secondary | ICD-10-CM | POA: Diagnosis not present

## 2022-12-03 DIAGNOSIS — G8929 Other chronic pain: Secondary | ICD-10-CM | POA: Diagnosis not present

## 2022-12-03 DIAGNOSIS — R2689 Other abnormalities of gait and mobility: Secondary | ICD-10-CM | POA: Diagnosis not present

## 2022-12-05 DIAGNOSIS — G8929 Other chronic pain: Secondary | ICD-10-CM | POA: Diagnosis not present

## 2022-12-05 DIAGNOSIS — Z9181 History of falling: Secondary | ICD-10-CM | POA: Diagnosis not present

## 2022-12-05 DIAGNOSIS — M6281 Muscle weakness (generalized): Secondary | ICD-10-CM | POA: Diagnosis not present

## 2022-12-05 DIAGNOSIS — R2689 Other abnormalities of gait and mobility: Secondary | ICD-10-CM | POA: Diagnosis not present

## 2022-12-10 ENCOUNTER — Encounter: Payer: Self-pay | Admitting: Cardiovascular Disease

## 2022-12-10 ENCOUNTER — Ambulatory Visit: Payer: Medicare Other | Attending: Cardiovascular Disease | Admitting: Cardiovascular Disease

## 2022-12-10 VITALS — BP 138/62 | HR 92 | Ht 63.0 in | Wt 137.0 lb

## 2022-12-10 DIAGNOSIS — I10 Essential (primary) hypertension: Secondary | ICD-10-CM | POA: Diagnosis present

## 2022-12-10 DIAGNOSIS — I493 Ventricular premature depolarization: Secondary | ICD-10-CM | POA: Diagnosis present

## 2022-12-10 DIAGNOSIS — E782 Mixed hyperlipidemia: Secondary | ICD-10-CM | POA: Diagnosis present

## 2022-12-10 DIAGNOSIS — I700 Atherosclerosis of aorta: Secondary | ICD-10-CM | POA: Diagnosis present

## 2022-12-10 NOTE — Assessment & Plan Note (Signed)
History of essential hypertension blood pressure measured today at 138/62.  She is on amlodipine and losartan.

## 2022-12-10 NOTE — Progress Notes (Signed)
12/10/2022 [name]   May 21, 1933  [id number]  Primary Physician Nadene Rubins Sonya Cowden, MD Primary Cardiologist: Runell Gess MD Nicholes Calamity, [place]  HPI:  [name] is a 89 y.o.  mildly-overweight married Caucasian female, mother of 3 and grandmother of 1 grandchild, who worked as a Conservator, museum/gallery. I last saw her in the office 12/19/2021.  Her husband [name] was a patient of mine as well unfortunately had  [name] body dementia and passed away in Sep 25, 2022 of last year.  Since I last saw her she has moved to [facility] in [place]. Her risk factors include type 2 diabetes, hypertension, and hyperlipidemia. She does have fibromyalgia. She has had a normal 2D echo and Myoview stress test in the past.  Since I saw her a little over a year ago she unfortunately had to have a redo right total hip replacement on 09/24/2016 by Dr. Charlann Boxer.  She denies chest pain or shortness of breath.  She does have purplish discoloration of both feet despite normal pulses and normal Doppler studies for unclear reasons.   She saw Joni Reining  NP in the office 01/29/2018 with atypical chest pain and asthma type symptoms.  Myoview stress test was low risk nonischemic, and echo revealed normal LV function with a anteroapical wall motion abnormality.  I did not think her symptoms were ischemically mediated.   Since I saw her in the office a year ago she continues to do well.  She denies chest pain.  She does get some mild dyspnea on exertion.  Her major complaint is fatigue and joint related issues from arthritis.  No outpatient medications have been marked as taking for the 12/10/22 encounter (Office Visit) with Runell Gess, MD.     Allergies  Allergen Reactions   Chlorthalidone     Other reaction(s): hyponatremia   Morphine    Morphine Sulfate Other (See Comments)    Makes her hyper    Social History   Socioeconomic History   Marital status: Widowed     Spouse name: Not on file   Number of children: 2   Years of education: 72   Highest education level: Master's degree (e.g., MA, MS, MEng, MEd, MSW, MBA)  Occupational History   Occupation: Retired    Comment: Head of [employer]  Tobacco Use   Smoking status: Former    Current packs/day: 0.00    Average packs/day: 0.1 packs/day for 10.0 years (1.0 ttl pk-yrs)    Types: Cigarettes    Start date: 03/25/1958    Quit date: 03/25/1968    Years since quitting: 54.7   Smokeless tobacco: Never  Vaping Use   Vaping status: Never Used  Substance and Sexual Activity   Alcohol use: No    Alcohol/week: 0.0 standard drinks of alcohol   Drug use: No   Sexual activity: Not Currently  Other Topics Concern   Not on file  Social History Narrative   Lives alone  in a 3 story home.  Has 2 children.  Retired Print production planner.  Education: Event organiser. Right handed    Social Determinants of Health   Financial Resource Strain: Not on file  Food Insecurity: Not on file  Transportation Needs: Not on file  Physical Activity: Not on file  Stress: Not on file  Social Connections: Unknown (08/03/2021)   Received from [facility]   Social Network    Social Network: Not on file  Intimate Partner Violence: Unknown (06/26/2021)   Received from [facility], [facility]   HITS    Physically Hurt: Not on file    Insult or Talk Down To: Not on file    Threaten Physical Harm: Not on file    Scream or Curse: Not on file     Review of Systems: General: negative for chills, fever, night sweats or weight changes.  Cardiovascular: negative for chest pain, dyspnea on exertion, edema, orthopnea, palpitations, paroxysmal nocturnal dyspnea or shortness of breath Dermatological: negative for rash Respiratory: negative for cough or wheezing Urologic: negative for hematuria Abdominal: negative for nausea, vomiting, diarrhea, bright red blood per rectum, melena, or hematemesis Neurologic:  negative for visual changes, syncope, or dizziness All other systems reviewed and are otherwise negative except as noted above.    Blood pressure 138/62, pulse 92, height 5\' 3"  (1.6 m), weight 137 lb (62.1 kg).  General appearance: alert and no distress Neck: no adenopathy, no carotid bruit, no JVD, supple, symmetrical, trachea midline, and thyroid not enlarged, symmetric, no tenderness/mass/nodules Lungs: clear to auscultation bilaterally Heart: regular rate and rhythm, S1, S2 normal, no murmur, click, rub or gallop Extremities: extremities normal, atraumatic, no cyanosis or edema Pulses: 2+ and symmetric Skin: Skin color, texture, turgor normal. No rashes or lesions Neurologic: Grossly normal  EKG sinus rhythm at 92 with bigeminal PVCs and left bundle branch block.  I personally reviewed this EKG.      ASSESSMENT AND PLAN:   Hyperlipidemia History of hyperlipidemia intolerant to statin therapy not interested in pursuing lipid-lowering therapy at this time.  Her last lipid profile performed 10/11/2021 revealed total cholesterol 300, LDL 194 and HDL of 91.  Essential hypertension History of essential hypertension blood pressure measured today at 138/62.  She is on amlodipine and losartan.  Frequent PVCs Patient complains of chronic fatigue though this seems to be one of her left hip problems.  Her EKG shows bigeminal PVCs.  She does not wish any further workup for this.     Runell Gess MD FACP,FACC,FAHA, [facility] 12/10/2022 1:40 PM

## 2022-12-10 NOTE — Assessment & Plan Note (Signed)
Patient complains of chronic fatigue though this seems to be one of her left hip problems.  Her EKG shows bigeminal PVCs.  She does not wish any further workup for this.

## 2022-12-10 NOTE — Assessment & Plan Note (Signed)
History of hyperlipidemia intolerant to statin therapy not interested in pursuing lipid-lowering therapy at this time.  Her last lipid profile performed 10/11/2021 revealed total cholesterol 300, LDL 194 and HDL of 91.

## 2022-12-10 NOTE — Patient Instructions (Signed)
Medication Instructions:  Your physician recommends that you continue on your current medications as directed. Please refer to the Current Medication list given to you today.  *If you need a refill on your cardiac medications before your next appointment, please call your pharmacy*   Follow-Up: At [facility], you and your health needs are our priority.  As part of our continuing mission to provide you with exceptional heart care, we have created designated Provider Care Teams.  These Care Teams include your primary Cardiologist (physician) and Advanced Practice Providers (APPs -  Physician Assistants and Nurse Practitioners) who all work together to provide you with the care you need, when you need it.  We recommend signing up for the patient portal called "MyChart".  Sign up information is provided on this After Visit Summary.  MyChart is used to connect with patients for Virtual Visits (Telemedicine).  Patients are able to view lab/test results, encounter notes, upcoming appointments, etc.  Non-urgent messages can be sent to your provider as well.   To learn more about what you can do with MyChart, go to [email or web address].    Your next appointment:   12 month(s)  Provider:   Nanetta Batty, MD

## 2022-12-11 DIAGNOSIS — M6281 Muscle weakness (generalized): Secondary | ICD-10-CM | POA: Diagnosis not present

## 2022-12-11 DIAGNOSIS — G8929 Other chronic pain: Secondary | ICD-10-CM | POA: Diagnosis not present

## 2022-12-11 DIAGNOSIS — R2689 Other abnormalities of gait and mobility: Secondary | ICD-10-CM | POA: Diagnosis not present

## 2022-12-11 DIAGNOSIS — Z9181 History of falling: Secondary | ICD-10-CM | POA: Diagnosis not present

## 2022-12-13 DIAGNOSIS — G8929 Other chronic pain: Secondary | ICD-10-CM | POA: Diagnosis not present

## 2022-12-13 DIAGNOSIS — M6281 Muscle weakness (generalized): Secondary | ICD-10-CM | POA: Diagnosis not present

## 2022-12-13 DIAGNOSIS — Z9181 History of falling: Secondary | ICD-10-CM | POA: Diagnosis not present

## 2022-12-13 DIAGNOSIS — R2689 Other abnormalities of gait and mobility: Secondary | ICD-10-CM | POA: Diagnosis not present

## 2022-12-16 DIAGNOSIS — G8929 Other chronic pain: Secondary | ICD-10-CM | POA: Diagnosis not present

## 2022-12-16 DIAGNOSIS — Z9181 History of falling: Secondary | ICD-10-CM | POA: Diagnosis not present

## 2022-12-16 DIAGNOSIS — R2689 Other abnormalities of gait and mobility: Secondary | ICD-10-CM | POA: Diagnosis not present

## 2022-12-16 DIAGNOSIS — M6281 Muscle weakness (generalized): Secondary | ICD-10-CM | POA: Diagnosis not present

## 2022-12-18 DIAGNOSIS — M6281 Muscle weakness (generalized): Secondary | ICD-10-CM | POA: Diagnosis not present

## 2022-12-18 DIAGNOSIS — R2689 Other abnormalities of gait and mobility: Secondary | ICD-10-CM | POA: Diagnosis not present

## 2022-12-18 DIAGNOSIS — G8929 Other chronic pain: Secondary | ICD-10-CM | POA: Diagnosis not present

## 2022-12-18 DIAGNOSIS — Z9181 History of falling: Secondary | ICD-10-CM | POA: Diagnosis not present

## 2022-12-23 DIAGNOSIS — M6281 Muscle weakness (generalized): Secondary | ICD-10-CM | POA: Diagnosis not present

## 2022-12-23 DIAGNOSIS — Z9181 History of falling: Secondary | ICD-10-CM | POA: Diagnosis not present

## 2022-12-23 DIAGNOSIS — R2689 Other abnormalities of gait and mobility: Secondary | ICD-10-CM | POA: Diagnosis not present

## 2022-12-23 DIAGNOSIS — G8929 Other chronic pain: Secondary | ICD-10-CM | POA: Diagnosis not present

## 2022-12-25 DIAGNOSIS — Z9181 History of falling: Secondary | ICD-10-CM | POA: Diagnosis not present

## 2022-12-25 DIAGNOSIS — G8929 Other chronic pain: Secondary | ICD-10-CM | POA: Diagnosis not present

## 2022-12-25 DIAGNOSIS — R2689 Other abnormalities of gait and mobility: Secondary | ICD-10-CM | POA: Diagnosis not present

## 2022-12-25 DIAGNOSIS — M6281 Muscle weakness (generalized): Secondary | ICD-10-CM | POA: Diagnosis not present

## 2022-12-29 ENCOUNTER — Other Ambulatory Visit: Payer: Self-pay | Admitting: Pulmonary Disease

## 2022-12-29 DIAGNOSIS — F410 Panic disorder [episodic paroxysmal anxiety] without agoraphobia: Secondary | ICD-10-CM

## 2022-12-30 DIAGNOSIS — Z9181 History of falling: Secondary | ICD-10-CM | POA: Diagnosis not present

## 2022-12-30 DIAGNOSIS — R2689 Other abnormalities of gait and mobility: Secondary | ICD-10-CM | POA: Diagnosis not present

## 2022-12-30 DIAGNOSIS — G8929 Other chronic pain: Secondary | ICD-10-CM | POA: Diagnosis not present

## 2022-12-30 DIAGNOSIS — M6281 Muscle weakness (generalized): Secondary | ICD-10-CM | POA: Diagnosis not present

## 2022-12-31 NOTE — Telephone Encounter (Signed)
Needs f/u visit next available

## 2023-01-01 DIAGNOSIS — M5136 Other intervertebral disc degeneration, lumbar region with discogenic back pain only: Secondary | ICD-10-CM | POA: Diagnosis not present

## 2023-01-01 DIAGNOSIS — M9903 Segmental and somatic dysfunction of lumbar region: Secondary | ICD-10-CM | POA: Diagnosis not present

## 2023-01-01 DIAGNOSIS — M9904 Segmental and somatic dysfunction of sacral region: Secondary | ICD-10-CM | POA: Diagnosis not present

## 2023-01-01 DIAGNOSIS — M9905 Segmental and somatic dysfunction of pelvic region: Secondary | ICD-10-CM | POA: Diagnosis not present

## 2023-01-01 NOTE — Telephone Encounter (Signed)
Patient scheduled 03/13/2023 with Dr. Judeth Horn.

## 2023-01-03 DIAGNOSIS — G8929 Other chronic pain: Secondary | ICD-10-CM | POA: Diagnosis not present

## 2023-01-03 DIAGNOSIS — Z9181 History of falling: Secondary | ICD-10-CM | POA: Diagnosis not present

## 2023-01-03 DIAGNOSIS — M6281 Muscle weakness (generalized): Secondary | ICD-10-CM | POA: Diagnosis not present

## 2023-01-03 DIAGNOSIS — R2689 Other abnormalities of gait and mobility: Secondary | ICD-10-CM | POA: Diagnosis not present

## 2023-01-06 DIAGNOSIS — M791 Myalgia, unspecified site: Secondary | ICD-10-CM | POA: Diagnosis not present

## 2023-01-07 DIAGNOSIS — Z9181 History of falling: Secondary | ICD-10-CM | POA: Diagnosis not present

## 2023-01-07 DIAGNOSIS — G8929 Other chronic pain: Secondary | ICD-10-CM | POA: Diagnosis not present

## 2023-01-07 DIAGNOSIS — M6281 Muscle weakness (generalized): Secondary | ICD-10-CM | POA: Diagnosis not present

## 2023-01-07 DIAGNOSIS — R2689 Other abnormalities of gait and mobility: Secondary | ICD-10-CM | POA: Diagnosis not present

## 2023-01-09 DIAGNOSIS — Z9181 History of falling: Secondary | ICD-10-CM | POA: Diagnosis not present

## 2023-01-09 DIAGNOSIS — M6281 Muscle weakness (generalized): Secondary | ICD-10-CM | POA: Diagnosis not present

## 2023-01-09 DIAGNOSIS — R2689 Other abnormalities of gait and mobility: Secondary | ICD-10-CM | POA: Diagnosis not present

## 2023-01-09 DIAGNOSIS — G8929 Other chronic pain: Secondary | ICD-10-CM | POA: Diagnosis not present

## 2023-01-10 ENCOUNTER — Telehealth: Payer: Self-pay | Admitting: Pulmonary Disease

## 2023-01-10 DIAGNOSIS — F410 Panic disorder [episodic paroxysmal anxiety] without agoraphobia: Secondary | ICD-10-CM

## 2023-01-10 NOTE — Telephone Encounter (Signed)
[name] daughter states refill for Xanax needs to go to new pharmacy. Pharmacy is [facility]. [name] phone number is [phone].

## 2023-01-10 NOTE — Telephone Encounter (Signed)
Routing to Dr. Judeth Horn as he has to be the one to send Rx to new pharmacy for pt.

## 2023-01-13 DIAGNOSIS — R2689 Other abnormalities of gait and mobility: Secondary | ICD-10-CM | POA: Diagnosis not present

## 2023-01-13 DIAGNOSIS — M6281 Muscle weakness (generalized): Secondary | ICD-10-CM | POA: Diagnosis not present

## 2023-01-13 DIAGNOSIS — G8929 Other chronic pain: Secondary | ICD-10-CM | POA: Diagnosis not present

## 2023-01-13 DIAGNOSIS — Z9181 History of falling: Secondary | ICD-10-CM | POA: Diagnosis not present

## 2023-01-13 MED ORDER — ALPRAZOLAM 0.25 MG PO TABS: ORAL_TABLET | ORAL | 0 refills | Status: DC

## 2023-01-13 NOTE — Telephone Encounter (Signed)
Sent to requested pharmacy.

## 2023-01-13 NOTE — Telephone Encounter (Signed)
LM for Misty Stanley patient's daughter refill requested has been sent to [facility] nothing further needed.

## 2023-01-15 DIAGNOSIS — R2689 Other abnormalities of gait and mobility: Secondary | ICD-10-CM | POA: Diagnosis not present

## 2023-01-15 DIAGNOSIS — M6281 Muscle weakness (generalized): Secondary | ICD-10-CM | POA: Diagnosis not present

## 2023-01-15 DIAGNOSIS — Z9181 History of falling: Secondary | ICD-10-CM | POA: Diagnosis not present

## 2023-01-15 DIAGNOSIS — G8929 Other chronic pain: Secondary | ICD-10-CM | POA: Diagnosis not present

## 2023-01-20 DIAGNOSIS — R2689 Other abnormalities of gait and mobility: Secondary | ICD-10-CM | POA: Diagnosis not present

## 2023-01-20 DIAGNOSIS — Z9181 History of falling: Secondary | ICD-10-CM | POA: Diagnosis not present

## 2023-01-20 DIAGNOSIS — M6281 Muscle weakness (generalized): Secondary | ICD-10-CM | POA: Diagnosis not present

## 2023-01-20 DIAGNOSIS — G8929 Other chronic pain: Secondary | ICD-10-CM | POA: Diagnosis not present

## 2023-01-22 DIAGNOSIS — I129 Hypertensive chronic kidney disease with stage 1 through stage 4 chronic kidney disease, or unspecified chronic kidney disease: Secondary | ICD-10-CM | POA: Diagnosis not present

## 2023-01-22 DIAGNOSIS — G3184 Mild cognitive impairment, so stated: Secondary | ICD-10-CM | POA: Diagnosis not present

## 2023-01-22 DIAGNOSIS — F339 Major depressive disorder, recurrent, unspecified: Secondary | ICD-10-CM | POA: Diagnosis not present

## 2023-01-22 DIAGNOSIS — G8929 Other chronic pain: Secondary | ICD-10-CM | POA: Diagnosis not present

## 2023-01-22 DIAGNOSIS — R61 Generalized hyperhidrosis: Secondary | ICD-10-CM | POA: Diagnosis not present

## 2023-01-22 DIAGNOSIS — M6281 Muscle weakness (generalized): Secondary | ICD-10-CM | POA: Diagnosis not present

## 2023-01-22 DIAGNOSIS — Z9181 History of falling: Secondary | ICD-10-CM | POA: Diagnosis not present

## 2023-01-22 DIAGNOSIS — I493 Ventricular premature depolarization: Secondary | ICD-10-CM | POA: Diagnosis not present

## 2023-01-22 DIAGNOSIS — R2689 Other abnormalities of gait and mobility: Secondary | ICD-10-CM | POA: Diagnosis not present

## 2023-01-27 DIAGNOSIS — Z961 Presence of intraocular lens: Secondary | ICD-10-CM | POA: Diagnosis not present

## 2023-01-27 DIAGNOSIS — R2689 Other abnormalities of gait and mobility: Secondary | ICD-10-CM | POA: Diagnosis not present

## 2023-01-27 DIAGNOSIS — G8929 Other chronic pain: Secondary | ICD-10-CM | POA: Diagnosis not present

## 2023-01-27 DIAGNOSIS — H01021 Squamous blepharitis right upper eyelid: Secondary | ICD-10-CM | POA: Diagnosis not present

## 2023-01-27 DIAGNOSIS — H40013 Open angle with borderline findings, low risk, bilateral: Secondary | ICD-10-CM | POA: Diagnosis not present

## 2023-01-27 DIAGNOSIS — H01024 Squamous blepharitis left upper eyelid: Secondary | ICD-10-CM | POA: Diagnosis not present

## 2023-01-27 DIAGNOSIS — Z9181 History of falling: Secondary | ICD-10-CM | POA: Diagnosis not present

## 2023-01-27 DIAGNOSIS — H353131 Nonexudative age-related macular degeneration, bilateral, early dry stage: Secondary | ICD-10-CM | POA: Diagnosis not present

## 2023-01-27 DIAGNOSIS — H35372 Puckering of macula, left eye: Secondary | ICD-10-CM | POA: Diagnosis not present

## 2023-01-27 DIAGNOSIS — H04123 Dry eye syndrome of bilateral lacrimal glands: Secondary | ICD-10-CM | POA: Diagnosis not present

## 2023-01-27 DIAGNOSIS — M6281 Muscle weakness (generalized): Secondary | ICD-10-CM | POA: Diagnosis not present

## 2023-01-28 DIAGNOSIS — M170 Bilateral primary osteoarthritis of knee: Secondary | ICD-10-CM | POA: Diagnosis not present

## 2023-01-29 DIAGNOSIS — R2689 Other abnormalities of gait and mobility: Secondary | ICD-10-CM | POA: Diagnosis not present

## 2023-01-29 DIAGNOSIS — M6281 Muscle weakness (generalized): Secondary | ICD-10-CM | POA: Diagnosis not present

## 2023-01-29 DIAGNOSIS — G8929 Other chronic pain: Secondary | ICD-10-CM | POA: Diagnosis not present

## 2023-01-29 DIAGNOSIS — Z9181 History of falling: Secondary | ICD-10-CM | POA: Diagnosis not present

## 2023-02-03 DIAGNOSIS — G8929 Other chronic pain: Secondary | ICD-10-CM | POA: Diagnosis not present

## 2023-02-03 DIAGNOSIS — Z9181 History of falling: Secondary | ICD-10-CM | POA: Diagnosis not present

## 2023-02-03 DIAGNOSIS — R2689 Other abnormalities of gait and mobility: Secondary | ICD-10-CM | POA: Diagnosis not present

## 2023-02-03 DIAGNOSIS — M6281 Muscle weakness (generalized): Secondary | ICD-10-CM | POA: Diagnosis not present

## 2023-02-04 DIAGNOSIS — M170 Bilateral primary osteoarthritis of knee: Secondary | ICD-10-CM | POA: Diagnosis not present

## 2023-02-05 DIAGNOSIS — M6281 Muscle weakness (generalized): Secondary | ICD-10-CM | POA: Diagnosis not present

## 2023-02-05 DIAGNOSIS — R2689 Other abnormalities of gait and mobility: Secondary | ICD-10-CM | POA: Diagnosis not present

## 2023-02-05 DIAGNOSIS — G8929 Other chronic pain: Secondary | ICD-10-CM | POA: Diagnosis not present

## 2023-02-05 DIAGNOSIS — Z9181 History of falling: Secondary | ICD-10-CM | POA: Diagnosis not present

## 2023-02-10 DIAGNOSIS — Z9181 History of falling: Secondary | ICD-10-CM | POA: Diagnosis not present

## 2023-02-10 DIAGNOSIS — G8929 Other chronic pain: Secondary | ICD-10-CM | POA: Diagnosis not present

## 2023-02-10 DIAGNOSIS — R2689 Other abnormalities of gait and mobility: Secondary | ICD-10-CM | POA: Diagnosis not present

## 2023-02-10 DIAGNOSIS — M6281 Muscle weakness (generalized): Secondary | ICD-10-CM | POA: Diagnosis not present

## 2023-02-11 DIAGNOSIS — M170 Bilateral primary osteoarthritis of knee: Secondary | ICD-10-CM | POA: Diagnosis not present

## 2023-02-12 ENCOUNTER — Institutional Professional Consult (permissible substitution): Payer: Medicare Other | Admitting: Psychology

## 2023-02-12 ENCOUNTER — Ambulatory Visit: Payer: Self-pay

## 2023-02-12 DIAGNOSIS — M6281 Muscle weakness (generalized): Secondary | ICD-10-CM | POA: Diagnosis not present

## 2023-02-12 DIAGNOSIS — G8929 Other chronic pain: Secondary | ICD-10-CM | POA: Diagnosis not present

## 2023-02-12 DIAGNOSIS — R2689 Other abnormalities of gait and mobility: Secondary | ICD-10-CM | POA: Diagnosis not present

## 2023-02-12 DIAGNOSIS — Z9181 History of falling: Secondary | ICD-10-CM | POA: Diagnosis not present

## 2023-02-17 DIAGNOSIS — Z9181 History of falling: Secondary | ICD-10-CM | POA: Diagnosis not present

## 2023-02-17 DIAGNOSIS — M6281 Muscle weakness (generalized): Secondary | ICD-10-CM | POA: Diagnosis not present

## 2023-02-17 DIAGNOSIS — R2689 Other abnormalities of gait and mobility: Secondary | ICD-10-CM | POA: Diagnosis not present

## 2023-02-17 DIAGNOSIS — G8929 Other chronic pain: Secondary | ICD-10-CM | POA: Diagnosis not present

## 2023-02-18 DIAGNOSIS — M7918 Myalgia, other site: Secondary | ICD-10-CM | POA: Diagnosis not present

## 2023-02-18 DIAGNOSIS — M9901 Segmental and somatic dysfunction of cervical region: Secondary | ICD-10-CM | POA: Diagnosis not present

## 2023-02-18 DIAGNOSIS — M5451 Vertebrogenic low back pain: Secondary | ICD-10-CM | POA: Diagnosis not present

## 2023-02-18 DIAGNOSIS — M9903 Segmental and somatic dysfunction of lumbar region: Secondary | ICD-10-CM | POA: Diagnosis not present

## 2023-02-18 DIAGNOSIS — M9904 Segmental and somatic dysfunction of sacral region: Secondary | ICD-10-CM | POA: Diagnosis not present

## 2023-02-18 DIAGNOSIS — M542 Cervicalgia: Secondary | ICD-10-CM | POA: Diagnosis not present

## 2023-02-19 ENCOUNTER — Encounter: Payer: Medicare Other | Admitting: Psychology

## 2023-02-19 DIAGNOSIS — M6281 Muscle weakness (generalized): Secondary | ICD-10-CM | POA: Diagnosis not present

## 2023-02-19 DIAGNOSIS — Z9181 History of falling: Secondary | ICD-10-CM | POA: Diagnosis not present

## 2023-02-19 DIAGNOSIS — G8929 Other chronic pain: Secondary | ICD-10-CM | POA: Diagnosis not present

## 2023-02-19 DIAGNOSIS — R2689 Other abnormalities of gait and mobility: Secondary | ICD-10-CM | POA: Diagnosis not present

## 2023-02-22 NOTE — Progress Notes (Unsigned)
Cardiology Office Note    Date:  02/25/2023  ID:  [name], DOB 1934/01/13, MRN [id number] PCP:  Melida Quitter, MD  Cardiologist:  Nanetta Batty, MD  Electrophysiologist:  None   Chief Complaint: Follow up for PVCs  History of Present Illness: [name] is a 89 y.o. female with visit-pertinent history of hyperlipidemia, hypertension, type 2 diabetes mellitus.   Patient had ABIs in 04/2017 that were within normal range.  In 01/2018 she presented with atypical chest pain and asthma type symptoms.  She did Myoview stress test that was low risk and nonischemic and echo revealed normal LV function with anteroapical wall motion abnormality.  She was last seen in clinic by Dr. Allyson Sabal on 12/10/2022, it was noted that she has complaints of chronic fatigue, her EKG showed bigeminal PVCs, at that time she did not wish to have further workup.  Today she presents for follow up regarding testing.  She notes that when she saw Dr. Allyson Sabal earlier this year he noted that she had frequent PVCs, she deferred further workup at that time.  Today she reports that she is agreeable to testing.  She does endorse intermittent palpitations described as a skipped beat, she denies any associated symptoms.  She denies any dizziness, lightheadedness, presyncope or syncope.  She does note some ongoing fatigue, although she notes she does not sleep well at night.  She denies chest pain or any new or worsening shortness of breath, notes some shortness of breath at baseline.  ROS: .   Today she denies chest pain, lower extremity edema,  melena, hematuria, hemoptysis, diaphoresis, weakness, presyncope, syncope, orthopnea, and PND.  All other systems are reviewed and otherwise negative. Studies Reviewed: Marland Kitchen    EKG:  EKG is ordered today, personally reviewed, demonstrating  EKG Interpretation Date/Time:  Tuesday February 25 2023 14:32:22 EST Ventricular Rate:  82 PR Interval:    QRS Duration:  122 QT  Interval:  412 QTC Calculation: 482 R Axis:   88  Text Interpretation: Sinus rhythm with frequent PVCs Left ventricular hypertrophy with QRS widening ( Sokolow-Lyon , Cornell product ) Left bundle branch block Confirmed by Reather Littler [phone]) on 02/25/2023 4:45:54 PM Also confirmed by Reather Littler [phone])  on 02/25/2023 4:54:41 PM   CV Studies: Cardiac Studies & Procedures     STRESS TESTS  MYOCARDIAL PERFUSION IMAGING 02/11/2018  Narrative  Nuclear stress EF: 56%.  There was no ST segment deviation noted during stress.  This is a low risk study.  The left ventricular ejection fraction is normal (55-65%).  1. EF 56%, normal wall motion. 2. Fixed small, mild apical perfusion defect.  Given normal wall motion, suspect attenuation artifact.  No ischemia.  Low risk study.   ECHOCARDIOGRAM  ECHOCARDIOGRAM COMPLETE 02/11/2018  Narrative *[facility] [address] [place], [place] [address] [phone]  ------------------------------------------------------------------- Transthoracic Echocardiography  Patient:    [name], [name] MR #:       [id number] Study Date: 02/11/2018 Gender:     F Age:        27 Height:     157.5 cm Weight:     63.9 kg BSA:        1.69 m^2 Pt. Status: Room:  ATTENDING    Donato Schultz, M.D. Raelyn Mora M REFERRING    Joni Reining M SONOGRAPHER  Dewitt Hoes, RDCS PERFORMING   [facility] Outpatient  cc:  ------------------------------------------------------------------- LV EF:  55% -   60%  ------------------------------------------------------------------- Indications:      Hypertension (I10).  ------------------------------------------------------------------- History:   PMH:   Chronic obstructive pulmonary disease.  Risk factors:  LBBB. Anemia. Pre-diabetes. Former tobacco use. Hypertension. Dyslipidemia.  ------------------------------------------------------------------- Study Conclusions  - Left  ventricle: The cavity size was normal. Systolic function was normal. The estimated ejection fraction was in the range of 55% to 60%. There is akinesis of the midanteroseptal myocardium. Doppler parameters are consistent with abnormal left ventricular relaxation (grade 1 diastolic dysfunction). - Ventricular septum: Septal motion showed abnormal function and dyssynergy. - Aortic valve: Trileaflet; mildly thickened, mildly calcified leaflets. There was trivial regurgitation. - Mitral valve: There was mild regurgitation.  ------------------------------------------------------------------- Study data:  Strain imaging. No prior study was available for comparison.  Study status:  Routine.  Procedure:  The patient reported no pain pre or post test. Transthoracic echocardiography. Image quality was adequate.  Study completion:  There were no complications.          Transthoracic echocardiography.  M-mode, complete 2D, spectral Doppler, and color Doppler.  Birthdate: Patient birthdate: 14-Jan-1934.  Age:  Patient is 89 yr old.  Sex: Gender: female.    BMI: 25.7 kg/m^2.  Blood pressure:     142/60 Patient status:  Outpatient.  Study date:  Study date: 02/11/2018. Study time: 08:35 AM.  Location:  [facility]  -------------------------------------------------------------------  ------------------------------------------------------------------- Left ventricle:  The cavity size was normal. Systolic function was normal. The estimated ejection fraction was in the range of 55% to 60%.  Regional wall motion abnormalities:   There is akinesis of the midanteroseptal myocardium. Doppler parameters are consistent with abnormal left ventricular relaxation (grade 1 diastolic dysfunction).  ------------------------------------------------------------------- Aortic valve:   Trileaflet; mildly thickened, mildly calcified leaflets. Mobility was not restricted.  Doppler:  Transvalvular velocity was  within the normal range. There was no stenosis. There was trivial regurgitation.  ------------------------------------------------------------------- Aorta:  Aortic root: The aortic root was normal in size.  ------------------------------------------------------------------- Mitral valve:   Mildly thickened leaflets . Mobility was not restricted.  Doppler:  Transvalvular velocity was within the normal range. There was no evidence for stenosis. There was mild regurgitation.    Valve area by pressure half-time: 5.37 cm^2. Indexed valve area by pressure half-time: 3.19 cm^2/m^2.  ------------------------------------------------------------------- Left atrium:  The atrium was normal in size.  ------------------------------------------------------------------- Right ventricle:  The cavity size was normal. Wall thickness was normal. Systolic function was normal.  ------------------------------------------------------------------- Ventricular septum:   Septal motion showed abnormal function and dyssynergy.  ------------------------------------------------------------------- Pulmonic valve:   Poorly visualized.  Structurally normal valve. Cusp separation was normal.  Doppler:  Transvalvular velocity was within the normal range. There was no evidence for stenosis. There was no regurgitation.  ------------------------------------------------------------------- Tricuspid valve:   Structurally normal valve.    Doppler: Transvalvular velocity was within the normal range. There was no regurgitation.  ------------------------------------------------------------------- Pulmonary artery:   The main pulmonary artery was normal-sized. Systolic pressure was within the normal range.  ------------------------------------------------------------------- Right atrium:  The atrium was normal in size.  ------------------------------------------------------------------- Pericardium:  There was no  pericardial effusion.  ------------------------------------------------------------------- Systemic veins: Inferior vena cava: The vessel was normal in size.  ------------------------------------------------------------------- Measurements  Left ventricle                         Value          Reference LV ID, ED, PLAX chordal        (  L)     38    mm       43 - 52 LV ID, ES, PLAX chordal                28    mm       23 - 38 LV fx shortening, PLAX chordal (L)     26    %        >=29 LV PW thickness, ED                    10    mm       --------- IVS/LV PW ratio, ED                    0.9            <=1.3 Stroke volume, 2D                      68    ml       --------- Stroke volume/bsa, 2D                  40    ml/m^2   --------- LV e&', lateral                         3.15  cm/s     --------- LV E/e&', lateral                       17.08          --------- LV e&', medial                          4.24  cm/s     --------- LV E/e&', medial                        12.69          --------- LV e&', average                         3.7   cm/s     --------- LV E/e&', average                       14.56          ---------  Ventricular septum                     Value          Reference IVS thickness, ED                      9     mm       ---------  LVOT                                   Value          Reference LVOT ID, S                             20    mm       --------- LVOT area  3.14  cm^2     --------- LVOT peak velocity, S                  112   cm/s     --------- LVOT mean velocity, S                  74.9  cm/s     --------- LVOT VTI, S                            21.5  cm       --------- LVOT peak gradient, S                  5     mm Hg    ---------  Aorta                                  Value          Reference Aortic root ID, ED                     28    mm       --------- Ascending aorta ID, A-P, S             29    mm       ---------  Left  atrium                            Value          Reference LA ID, A-P, ES                         32    mm       --------- LA ID/bsa, A-P                         1.9   cm/m^2   <=2.2 LA volume, S                           21.9  ml       --------- LA volume/bsa, S                       13    ml/m^2   --------- LA volume, ES, 1-p A4C                 22.2  ml       --------- LA volume/bsa, ES, 1-p A4C             13.2  ml/m^2   --------- LA volume, ES, 1-p A2C                 21.1  ml       --------- LA volume/bsa, ES, 1-p A2C             12.5  ml/m^2   ---------  Mitral valve                           Value          Reference Mitral E-wave peak velocity  53.8  cm/s     --------- Mitral A-wave peak velocity            108   cm/s     --------- Mitral deceleration time       (L)     141   ms       150 - 230 Mitral pressure half-time              41    ms       --------- Mitral E/A ratio, peak                 0.5            --------- Mitral valve area, PHT, DP             5.37  cm^2     --------- Mitral valve area/bsa, PHT, DP         3.19  cm^2/m^2 ---------  Systemic veins                         Value          Reference Estimated CVP                          3     mm Hg    ---------  Right ventricle                        Value          Reference TAPSE                                  16.5  mm       --------- RV s&', lateral, S                      11.3  cm/s     ---------  Legend: (L)  and  (H)  mark values outside specified reference range.  ------------------------------------------------------------------- Prepared and Electronically Authenticated by  Donato Schultz, M.D. 2019-11-20T11:15:59              Current Reported Medications:.    Current Meds  Medication Sig   acetaminophen (TYLENOL) 500 MG tablet Take by mouth as needed.   albuterol (VENTOLIN HFA) 108 (90 Base) MCG/ACT inhaler Inhale 2 puffs into the lungs every 6 (six) hours as needed for wheezing or  shortness of breath.   ALPRAZolam (XANAX) 0.25 MG tablet TAKE 1/2 TABLET BY MOUTH TWICE DAILY AS NEEDED FOR ANXIETY   amLODipine (NORVASC) 5 MG tablet TAKE 1 TABLET BY MOUTH ONCE DAILY   azelastine (ASTELIN) 0.1 % nasal spray USE 1 SPRAY IN EACH NOSTRIL TWICE DAILY   cetirizine (ZYRTEC) 10 MG tablet Take 10 mg by mouth daily.    Cholecalciferol (VITAMIN D3) 5000 units TABS 5,000 IU OTC vitamin D3 daily.   Cyanocobalamin (VITAMIN B-12) 2500 MCG TABS Take 1,250 mcg by mouth daily.   docusate sodium (COLACE) 100 MG capsule Take 100 mg by mouth daily as needed.   fluticasone (FLONASE) 50 MCG/ACT nasal spray USE 2 SPRAYS INTO EACH NOSTRIL EVERY DAY   losartan (COZAAR) 100 MG tablet TAKE 1 TABLET BY MOUTH DAILY   mirabegron ER (MYRBETRIQ) 25 MG TB24 tablet 1 tablet Orally Once a day   montelukast (SINGULAIR) 10 MG tablet  TAKE 1 TABLET BY MOUTH DAILY AT BEDTIME   omeprazole (PRILOSEC) 20 MG capsule TAKE 1 CAPSULE BY MOUTH DAILY   silver sulfADIAZINE (SILVADENE) 1 % cream Apply 1 Application topically daily.   SYMBICORT 80-4.5 MCG/ACT inhaler INHALE 2 PUFFS BY MOUTH INTO THE LUNGS TWICE A DAY    Physical Exam:    VS:  BP (!) 130/56 (BP Location: Right Arm, Patient Position: Sitting, Cuff Size: Normal)   Pulse 82   Ht 5\' 4"  (1.626 m)   Wt 136 lb 3.2 oz (61.8 kg)   SpO2 94%   BMI 23.38 kg/m    Wt Readings from Last 3 Encounters:  02/25/23 136 lb 3.2 oz (61.8 kg)  12/10/22 137 lb (62.1 kg)  05/29/22 136 lb 9.6 oz (62 kg)    GEN: Well nourished, well developed in no acute distress NECK: No JVD; No carotid bruits CARDIAC: RRR, no murmurs, rubs, gallops RESPIRATORY:  Clear to auscultation without rales, wheezing or rhonchi  ABDOMEN: Soft, non-tender, non-distended EXTREMITIES:  No edema; No acute deformity   Asessement and Plan:.    PVCs/Palpitations/left bundle branch block/shortness of breath: EKG in 12/10/2022 showed bigeminal PVCs, at that time she was not interested in further workup.   Today she reports intermittent feelings of skipped beats, denies associated symptoms.  She denies any dizziness, lightheadedness, presyncope or syncope.  She does note some ongoing fatigue although does not sleep well at night.  She is now agreeable to further testing regarding her PVCs.  Her EKG today indicates sinus rhythm with frequent PVCs at 82 bpm, left bundle branch block.  She notes baseline shortness of breath. Encouraged patient to stay well-hydrated, decrease caffeine intake.  She is agreeable to wearing a cardiac monitor for 2 weeks and will obtain echocardiogram to assess heart structure and function. Check CBC, BMET, Mag and TSH.   Hyperlipidemia: Per Dr. Renelda Mom note her last lipid profile in 09/2021 revealed total cholesterol 300, LDL 194 and HDL of 91. History story of statin intolerance, not interested in pursuing lipid-lowering therapy.  Hypertension: Blood pressure today 130/56.  Continue amlodipine 5 mg daily, losartan 100 mg daily.    Disposition: F/u with Dr. Allyson Sabal or Reather Littler, NP in six weeks.   Signed, Rip Harbour, NP

## 2023-02-25 ENCOUNTER — Ambulatory Visit: Payer: Medicare Other | Attending: Cardiology | Admitting: Cardiology

## 2023-02-25 ENCOUNTER — Ambulatory Visit (INDEPENDENT_AMBULATORY_CARE_PROVIDER_SITE_OTHER): Payer: Medicare Other

## 2023-02-25 ENCOUNTER — Encounter: Payer: Self-pay | Admitting: Internal Medicine

## 2023-02-25 ENCOUNTER — Encounter: Payer: Self-pay | Admitting: Cardiology

## 2023-02-25 VITALS — BP 130/56 | HR 82 | Ht 64.0 in | Wt 136.2 lb

## 2023-02-25 DIAGNOSIS — R0602 Shortness of breath: Secondary | ICD-10-CM | POA: Diagnosis not present

## 2023-02-25 DIAGNOSIS — I493 Ventricular premature depolarization: Secondary | ICD-10-CM | POA: Diagnosis not present

## 2023-02-25 DIAGNOSIS — I10 Essential (primary) hypertension: Secondary | ICD-10-CM | POA: Diagnosis not present

## 2023-02-25 DIAGNOSIS — R002 Palpitations: Secondary | ICD-10-CM | POA: Diagnosis not present

## 2023-02-25 DIAGNOSIS — I447 Left bundle-branch block, unspecified: Secondary | ICD-10-CM

## 2023-02-25 NOTE — Patient Instructions (Addendum)
Medication Instructions:  No changes *If you need a refill on your cardiac medications before your next appointment, please call your pharmacy*   Lab Work: Today we will draw BMP, CBC, Mag, TSH If you have labs (blood work) drawn today and your tests are completely normal, you will receive your results only by: MyChart Message (if you have MyChart) OR A paper copy in the mail If you have any lab test that is abnormal or we need to change your treatment, we will call you to review the results.   Testing/Procedures:  Your physician has requested that you have an echocardiogram. Echocardiography is a painless test that uses sound waves to create images of your heart. It provides your doctor with information about the size and shape of your heart and how well your heart's chambers and valves are working. This procedure takes approximately one hour. There are no restrictions for this procedure. Please do NOT wear cologne, perfume, aftershave, or lotions (deodorant is allowed). Please arrive 15 minutes prior to your appointment time.  Please note: We ask at that you not bring children with you during ultrasound (echo/ vascular) testing. Due to room size and safety concerns, children are not allowed in the ultrasound rooms during exams. Our front office staff cannot provide observation of children in our lobby area while testing is being conducted. An adult accompanying a patient to their appointment will only be allowed in the ultrasound room at the discretion of the ultrasound technician under special circumstances. We apologize for any inconvenience.  ZIO XT- Long Term Monitor Instructions  Your physician has requested you wear a ZIO patch monitor for 14 days.  This is a single patch monitor. [employer] supplies one patch monitor per enrollment. Additional stickers are not available. Please do not apply patch if you will be having a Nuclear Stress Test,  Echocardiogram, Cardiac CT, MRI, or Chest  Xray during the period you would be wearing the  monitor. The patch cannot be worn during these tests. You cannot remove and re-apply the  ZIO XT patch monitor.  Your ZIO patch monitor will be mailed 3 day USPS to your address on file. It may take 3-5 days  to receive your monitor after you have been enrolled.  Once you have received your monitor, please review the enclosed instructions. Your monitor  has already been registered assigning a specific monitor serial # to you.  Billing and Patient Assistance Program Information  We have supplied [employer] with any of your insurance information on file for billing purposes. [employer] offers a sliding scale Patient Assistance Program for patients that do not have  insurance, or whose insurance does not completely cover the cost of the ZIO monitor.  You must apply for the Patient Assistance Program to qualify for this discounted rate.  To apply, please call [employer] at [phone], select option 4, select option 2, ask to apply for  Patient Assistance Program. Meredeth Ide will ask your household income, and how many people  are in your household. They will quote your out-of-pocket cost based on that information.  [employer] will also be able to set up a 32-month, interest-free payment plan if needed.  Applying the monitor   Shave hair from upper left chest.  Hold abrader disc by orange tab. Rub abrader in 40 strokes over the upper left chest as  indicated in your monitor instructions.  Clean area with 4 enclosed alcohol pads. Let dry.  Apply patch as indicated in monitor instructions. Patch will be placed  under collarbone on left  side of chest with arrow pointing upward.  Rub patch adhesive wings for 2 minutes. Remove white label marked "1". Remove the white  label marked "2". Rub patch adhesive wings for 2 additional minutes.  While looking in a mirror, press and release button in center of patch. A small green light will  flash 3-4 times. This will  be your only indicator that the monitor has been turned on.  Do not shower for the first 24 hours. You may shower after the first 24 hours.  Press the button if you feel a symptom. You will hear a small click. Record Date, Time and  Symptom in the Patient Logbook.  When you are ready to remove the patch, follow instructions on the last 2 pages of Patient  Logbook. Stick patch monitor onto the last page of Patient Logbook.  Place Patient Logbook in the blue and white box. Use locking tab on box and tape box closed  securely. The blue and white box has prepaid postage on it. Please place it in the mailbox as  soon as possible. Your physician should have your test results approximately 7 days after the  monitor has been mailed back to [facility].  Call [facility] Customer Care at [phone] if you have questions regarding  your ZIO XT patch monitor. Call them immediately if you see an orange light blinking on your  monitor.  If your monitor falls off in less than 4 days, contact our Monitor department at [phone].  If your monitor becomes loose or falls off after 4 days call [employer] at [phone] for  suggestions on securing your monitor   Follow-Up: At [facility], you and your health needs are our priority.  As part of our continuing mission to provide you with exceptional heart care, we have created designated Provider Care Teams.  These Care Teams include your primary Cardiologist (physician) and Advanced Practice Providers (APPs -  Physician Assistants and Nurse Practitioners) who all work together to provide you with the care you need, when you need it.  We recommend signing up for the patient portal called "MyChart".  Sign up information is provided on this After Visit Summary.  MyChart is used to connect with patients for Virtual Visits (Telemedicine).  Patients are able to view lab/test results, encounter notes, upcoming appointments, etc.  Non-urgent messages  can be sent to your provider as well.   To learn more about what you can do with MyChart, go to [email or web address].    Your next appointment:   6 week(s)  Provider:   Nanetta Batty, MD  or Reather Littler, NP

## 2023-02-25 NOTE — Progress Notes (Unsigned)
Enrolled patient for a 14 day Zio XT monitor to be mailed to patients home   [name] to read

## 2023-02-26 DIAGNOSIS — G8929 Other chronic pain: Secondary | ICD-10-CM | POA: Diagnosis not present

## 2023-02-26 DIAGNOSIS — M6281 Muscle weakness (generalized): Secondary | ICD-10-CM | POA: Diagnosis not present

## 2023-02-26 DIAGNOSIS — R2689 Other abnormalities of gait and mobility: Secondary | ICD-10-CM | POA: Diagnosis not present

## 2023-02-26 DIAGNOSIS — Z9181 History of falling: Secondary | ICD-10-CM | POA: Diagnosis not present

## 2023-02-26 LAB — CBC
Hematocrit: 37 % (ref 34.0–46.6)
Hemoglobin: 12.4 g/dL (ref 11.1–15.9)
MCH: 31.4 pg (ref 26.6–33.0)
MCHC: 33.5 g/dL (ref 31.5–35.7)
MCV: 94 fL (ref 79–97)
Platelets: 190 10*3/uL (ref 150–450)
RBC: 3.95 x10E6/uL (ref 3.77–5.28)
RDW: 13.4 % (ref 11.7–15.4)
WBC: 8.6 10*3/uL (ref 3.4–10.8)

## 2023-02-26 LAB — BASIC METABOLIC PANEL
BUN/Creatinine Ratio: 17 (ref 12–28)
BUN: 12 mg/dL (ref 8–27)
CO2: 22 mmol/L (ref 20–29)
Calcium: 9.1 mg/dL (ref 8.7–10.3)
Chloride: 97 mmol/L (ref 96–106)
Creatinine, Ser: 0.71 mg/dL (ref 0.57–1.00)
Glucose: 88 mg/dL (ref 70–99)
Potassium: 4.7 mmol/L (ref 3.5–5.2)
Sodium: 133 mmol/L — ABNORMAL LOW (ref 134–144)
eGFR: 82 mL/min/{1.73_m2} (ref >59)

## 2023-02-26 LAB — MAGNESIUM: Magnesium: 1.9 mg/dL (ref 1.6–2.3)

## 2023-02-26 LAB — TSH: TSH: 2.31 u[IU]/mL (ref 0.450–4.500)

## 2023-03-03 DIAGNOSIS — R002 Palpitations: Secondary | ICD-10-CM | POA: Diagnosis not present

## 2023-03-04 DIAGNOSIS — Z9181 History of falling: Secondary | ICD-10-CM | POA: Diagnosis not present

## 2023-03-04 DIAGNOSIS — G8929 Other chronic pain: Secondary | ICD-10-CM | POA: Diagnosis not present

## 2023-03-04 DIAGNOSIS — M6281 Muscle weakness (generalized): Secondary | ICD-10-CM | POA: Diagnosis not present

## 2023-03-04 DIAGNOSIS — R2689 Other abnormalities of gait and mobility: Secondary | ICD-10-CM | POA: Diagnosis not present

## 2023-03-06 DIAGNOSIS — Z9181 History of falling: Secondary | ICD-10-CM | POA: Diagnosis not present

## 2023-03-06 DIAGNOSIS — G8929 Other chronic pain: Secondary | ICD-10-CM | POA: Diagnosis not present

## 2023-03-06 DIAGNOSIS — M6281 Muscle weakness (generalized): Secondary | ICD-10-CM | POA: Diagnosis not present

## 2023-03-06 DIAGNOSIS — R2689 Other abnormalities of gait and mobility: Secondary | ICD-10-CM | POA: Diagnosis not present

## 2023-03-13 ENCOUNTER — Ambulatory Visit: Payer: Medicare Other | Admitting: Pulmonary Disease

## 2023-03-13 ENCOUNTER — Encounter: Payer: Self-pay | Admitting: Pulmonary Disease

## 2023-03-13 VITALS — BP 122/68 | HR 82 | Ht 64.0 in | Wt 134.2 lb

## 2023-03-13 DIAGNOSIS — J4531 Mild persistent asthma with (acute) exacerbation: Secondary | ICD-10-CM

## 2023-03-13 NOTE — Patient Instructions (Signed)
No changes to medications  If you are able to send me an updated medication list that would be great  Return to clinic in 6 months or sooner as needed with Dr. Judeth Horn

## 2023-03-13 NOTE — Progress Notes (Signed)
Synopsis: Longstanding patient of Dr. Kendrick Fries managed here for asthma  Subjective:   PATIENT ID: [name] GENDER: female DOB: [birth year], MRN: [id number]  Chief Complaint  Patient presents with   Follow-up    Pt has moved to [place] , she is feeling tried, she is follow up with cardiologist she is currently wearing a heart monitor.      HPI Here for asthma, recurrent sinus congestion complicated by anxiety disorder. Regimen consists of symbicort 80/4.5 BID, singulair, omeprazole, flonase, zyrtec, PRN xanax.   Breathing feels fine.  No real changes.  Anxiety a bit worse.  Multiple stressors.  Recent moved to [facility].  Neighbors recently sent the hospital.  Actively dying.  Make her diet a couple months ago.  Hard for her to see her friends and neighbors are facility passed away.  Xanax was helpful.  Feels like she needs additional.  She states her Cymbalta was stopped.  Her Lexapro is chronic she says.  As her discussed with her PCP any other changes they can make for anxiety medicines as primary driver and seems worsening over time.  She is using her inhalers as needed.  Breathing seems fine.  Labs reviewed and notable for:  IgE 3 2017, 4 2009 Eos not elevated TSH WNL  ROS No orthopnea or PND. No chest pain. Comprehensive review of systems negative.   Objective:  GEN: elderly woman in NAD HEENT: MMM, trachea midline CV: RRR, ext warm PULM: clear in all lung fields, no accessory muscle use GI: Soft, +BS EXT: no edema PSYCH: AOx3, normal affect SKIN: No rashes    Vitals:   03/13/23 1031  BP: 122/68  Pulse: 82  SpO2: 92%  Weight: 134 lb 3.2 oz (60.9 kg)  Height: 5\' 4"  (1.626 m)   92% on RA BMI Readings from Last 3 Encounters:  03/13/23 23.04 kg/m  02/25/23 23.38 kg/m  12/10/22 24.27 kg/m   Wt Readings from Last 3 Encounters:  03/13/23 134 lb 3.2 oz (60.9 kg)  02/25/23 136 lb 3.2 oz (61.8 kg)  12/10/22 137 lb (62.1 kg)     CBC    Component  Value Date/Time   WBC 8.6 02/25/2023 1530   WBC 7.6 04/04/2022 1712   RBC 3.95 02/25/2023 1530   RBC 4.24 04/04/2022 1712   HGB 12.4 02/25/2023 1530   HGB 12.6 05/20/2011 1331   HCT 37.0 02/25/2023 1530   HCT 37.5 05/20/2011 1331   PLT 190 02/25/2023 1530   MCV 94 02/25/2023 1530   MCV 93.7 05/20/2011 1331   MCH 31.4 02/25/2023 1530   MCH 30.7 04/04/2022 1712   MCHC 33.5 02/25/2023 1530   MCHC 32.5 04/04/2022 1712   RDW 13.4 02/25/2023 1530   RDW 15.3 (H) 05/20/2011 1331   LYMPHSABS 1.6 04/04/2022 1712   LYMPHSABS 1.2 03/29/2019 1046   LYMPHSABS 1.3 05/20/2011 1331   MONOABS 0.6 04/04/2022 1712   MONOABS 0.4 05/20/2011 1331   EOSABS 0.1 04/04/2022 1712   EOSABS 0.1 03/29/2019 1046   BASOSABS 0.0 04/04/2022 1712   BASOSABS 0.0 03/29/2019 1046   BASOSABS 0.0 05/20/2011 1331    Chest Imaging: CXR 2019 - clear lungs, mild hyperinflation on my interpretation   Pulmonary Functions Testing Results:    Latest Ref Rng & Units 08/18/2015    9:00 AM  PFT Results  FVC-Pre L 2.43  P  FVC-Predicted Pre % 98  P  FVC-Post L 2.45  P  FVC-Predicted Post % 98  P  Pre FEV1/FVC % % 84  P  Post FEV1/FCV % % 86  P  FEV1-Pre L 2.05  P  FEV1-Predicted Pre % 111  P  FEV1-Post L 2.10  P  DLCO uncorrected ml/min/mmHg 17.47  P  DLCO UNC% % 76  P  DLCO corrected ml/min/mmHg 17.02  P  DLCO COR %Predicted % 74  P  DLVA Predicted % 88  P  TLC L 4.57  P  TLC % Predicted % 93  P  RV % Predicted % 92  P    P Preliminary result  Interpretation: 2017 WNL, moderate fixed obstruction in 2019  Echo diastolic dysfunction 2019 Nuclear stress WNL 2019   Assessment & Plan:   # Mild persistent asthma with superimposed panic attack disorder # Panic attacks worsened by frequent albuterol and prednisone Rx; using xanax has prevented her from needing these meds, using 1/2 tab of 0.25 mg tablet twice a day  # allergic rhinitis, well controlled with azelastine nasal spray  Discussion: - Continue  symbicort 80/4.5 BID and albuterol PRN - Continue low dose xanax    Current Outpatient Medications:    acetaminophen (TYLENOL) 500 MG tablet, Take by mouth as needed., Disp: , Rfl:    albuterol (VENTOLIN HFA) 108 (90 Base) MCG/ACT inhaler, Inhale 2 puffs into the lungs every 6 (six) hours as needed for wheezing or shortness of breath., Disp: 1 each, Rfl: 5   ALPRAZolam (XANAX) 0.25 MG tablet, TAKE 1/2 TABLET BY MOUTH TWICE DAILY AS NEEDED FOR ANXIETY, Disp: 90 tablet, Rfl: 0   amLODipine (NORVASC) 5 MG tablet, TAKE 1 TABLET BY MOUTH ONCE DAILY, Disp: 90 tablet, Rfl: 3   azelastine (ASTELIN) 0.1 % nasal spray, USE 1 SPRAY IN EACH NOSTRIL TWICE DAILY, Disp: 30 mL, Rfl: 12   cetirizine (ZYRTEC) 10 MG tablet, Take 10 mg by mouth daily. , Disp: , Rfl:    Cholecalciferol (VITAMIN D3) 5000 units TABS, 5,000 IU OTC vitamin D3 daily., Disp: 90 tablet, Rfl: 3   Cyanocobalamin (VITAMIN B-12) 2500 MCG TABS, Take 1,250 mcg by mouth daily., Disp: , Rfl:    docusate sodium (COLACE) 100 MG capsule, Take 100 mg by mouth daily as needed., Disp: , Rfl:    DULoxetine (CYMBALTA) 30 MG capsule, Take 1 capsule (30 mg total) by mouth daily., Disp: 90 capsule, Rfl: 0   DULoxetine (CYMBALTA) 60 MG capsule, Take 60 mg by mouth daily. Take with 30 mg tablet, Disp: , Rfl:    fluticasone (FLONASE) 50 MCG/ACT nasal spray, USE 2 SPRAYS INTO EACH NOSTRIL EVERY DAY, Disp: 48 g, Rfl: 3   losartan (COZAAR) 100 MG tablet, TAKE 1 TABLET BY MOUTH DAILY, Disp: 90 tablet, Rfl: 1   mirabegron ER (MYRBETRIQ) 25 MG TB24 tablet, 1 tablet Orally Once a day, Disp: , Rfl:    montelukast (SINGULAIR) 10 MG tablet, TAKE 1 TABLET BY MOUTH DAILY AT BEDTIME, Disp: 90 tablet, Rfl: 2   omeprazole (PRILOSEC) 20 MG capsule, TAKE 1 CAPSULE BY MOUTH DAILY, Disp: 90 capsule, Rfl: 1   silver sulfADIAZINE (SILVADENE) 1 % cream, Apply 1 Application topically daily., Disp: 50 g, Rfl: 0   SYMBICORT 80-4.5 MCG/ACT inhaler, INHALE 2 PUFFS BY MOUTH INTO THE  LUNGS TWICE A DAY, Disp: 1 each, Rfl: 11   Karren Burly, MD [facility] 03/13/2023 10:48 AM

## 2023-03-20 ENCOUNTER — Encounter: Payer: Self-pay | Admitting: Nurse Practitioner

## 2023-03-20 ENCOUNTER — Ambulatory Visit: Payer: Medicare Other | Admitting: Nurse Practitioner

## 2023-03-20 ENCOUNTER — Telehealth: Payer: Self-pay | Admitting: *Deleted

## 2023-03-20 VITALS — HR 72 | Temp 98.1°F | Resp 20

## 2023-03-20 DIAGNOSIS — R300 Dysuria: Secondary | ICD-10-CM | POA: Diagnosis not present

## 2023-03-20 MED ORDER — NITROFURANTOIN MONOHYD MACRO 100 MG PO CAPS: 100.0000 mg | ORAL_CAPSULE | Freq: Two times a day (BID) | ORAL | 0 refills | Status: DC

## 2023-03-20 NOTE — Telephone Encounter (Signed)
Per Shanda Bumps: call Ast and let her know she does have leukocytes in her urine and blood  JE we will let her know what the culture shows when it comes back   Patient Notified and agreed.

## 2023-03-20 NOTE — Progress Notes (Signed)
Careteam: Patient Care Team: Melida Quitter, MD as PCP - General (Internal Medicine) Runell Gess, MD as PCP - Cardiology (Cardiology) Meryl Dare, MD as Referring Physician (Gastroenterology) Esaw Dace, MD as Referring Physician (Urology) Ernesto Rutherford, MD as Referring Physician (Ophthalmology) Runell Gess, MD as Referring Physician (Cardiology) Arminda Resides, MD as Consulting Physician (Dermatology) Lupita Leash, MD as Consulting Physician (Pulmonary Disease) Sheran Luz, MD as Consulting Physician (Physical Medicine and Rehabilitation) Rosalee Kaufman, RPH-CPP as Pharmacist (Cardiology) Glendale Chard, DO as Consulting Physician (Neurology) Rodriguez-Guzman, Meriel Pica, RPH-CPP (Pharmacist) Durene Romans, MD as Consulting Physician (Orthopedic Surgery) Pollyann Savoy, MD as Consulting Physician (Rheumatology) Venita Lick, MD as Consulting Physician (Orthopedic Surgery) Lorin Glass, MD as Consulting Physician (Pulmonary Disease)  PLACE OF SERVICE:  [facility]  Advanced Directive information    Allergies  Allergen Reactions   Chlorthalidone     Other reaction(s): hyponatremia   Morphine    Morphine Sulfate Other (See Comments)    Makes her hyper    Chief Complaint  Patient presents with   Acute Visit    Complains of Burning with Urination and Frequency.      HPI: Patient is a 89 y.o. female for burning with urination Started overnight and then again had burning again this morning.  She is on myrbetriq and does not urinate as frequently.  She has been on OAB medication for 2 years.  She is not having any increase in frequency but has urgency and burning.  No fevers or chills No abdominal pain or new back pain.  Had hx of UTI but has not had one recently No constipation.   Review of Systems:  Review of Systems  Constitutional:  Negative for chills and fever.  Genitourinary:  Positive for dysuria and urgency.  Negative for frequency.    Past Medical History:  Diagnosis Date    Muscle spasms of back due to scoliosis 05/27/2018   Abnormal glucose level 08/27/2019   Adjustment disorder with mixed anxiety and depressed mood 01/11/2016   Allergic rhinitis 08/12/2007   Arthralgia 07/02/2019   Asthma, mild persistent 12/19/2008   Mild persistent asthma with atopy March 2017 eosinophil count 100 cells per microliter, IgE 26 Jul 2015 pulmonary function testing ratio 86%, FEV1 2.10 L (113% predicted), FVC 2.45 (98% predicted), no change in FEV1 with bronchodilator, total lung capacity 4.57 L (93% predicted). DLCO 17.47 (76% predicted).     Atypical chest pain 02/17/2018   Bronchitis    Chronic constipation    Chronic fatigue syndrome with fibromyalgia 01/20/2016   Chronic kidney disease due to hypertension 08/27/2019   DDD (degenerative disc disease), lumbar 10/21/2017   Status post surgery x2   Dysuria 08/18/2015   Essential hypertension 08/12/2007   Gastro-esophageal reflux disease without esophagitis 08/12/2007   Generalized anxiety disorder 08/12/2007   Hardening of the aorta (main artery of the heart)    Herpes simplex labialis 04/03/2012   History of cataracts    bilateral   History of COPD 10/01/2017   History of migraine headaches    History of urinary tract infection 05/30/2010   Hyperlipidemia 08/12/2007   Insomnia 04/23/2017   Interstitial cystitis    Iron deficiency anemia 11/03/2009   Left bundle branch block 11/13/2010   Lumbar radiculopathy, chronic    Lumbar spondylosis 02/24/2020   MCI (mild cognitive impairment) 08/03/2020   Medication intolerance- statins- (make her FM much W) 01/10/2016   Medication refused- (any cholesterol ones)  01/20/2016   Metatarsalgia of left foot 04/23/2019   Osteoarthritis of knees, bilateral 11/07/2021   Pain in joint of left shoulder 08/03/2021   Pain in joint of right hip 04/04/2022   Pain in joint of right shoulder 06/25/2017   Prediabetes  01/11/2016   Primary [name] disease without gangrene 06/02/2017   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   Vitamin D deficiency 01/19/2016   Past Surgical History:  Procedure Laterality Date   ABDOMINAL HYSTERECTOMY     ANTERIOR HIP REVISION Right 09/24/2016   Procedure: RIGHT HIP ACETABULUM AND FEMORAL HEAD REVISION WITH BONE GRAFT;  Surgeon: Durene Romans, MD;  Location: [facility];  Service: Orthopedics;  Laterality: Right;   APPENDECTOMY     BACK SURGERY     CATARACT EXTRACTION     COLONOSCOPY     DOPPLER ECHOCARDIOGRAPHY     ESOPHAGOGASTRODUODENOSCOPY ENDOSCOPY     JOINT REPLACEMENT     LUMBAR DISC SURGERY     NM MYOVIEW LTD     stress test   Ovarian cyst removed     ROTATOR CUFF REPAIR Left 5-6 years ago   Dr Rennis Chris; done at [facility] surgery center    TOTAL HIP ARTHROPLASTY     TOTAL SHOULDER ARTHROPLASTY     Social History:   reports that she quit smoking about 55 years ago. Her smoking use included cigarettes. She started smoking about 65 years ago. She has a 1 pack-year smoking history. She has never used smokeless tobacco. She reports that she does not drink alcohol and does not use drugs.  Family History  Problem Relation Age of Onset   Arthritis Mother    Hypertension Mother    Heart disease Father    Arthritis Father    Hypertension Father    Memory loss Father        likely Alzheimer's disease; symptom onset in 76s   Depression Sister    Hyperlipidemia Sister    Parkinson's disease Sister    Memory loss Sister    Hypertension Other    Hyperlipidemia Other     Medications: Patient's Medications  New Prescriptions   No  medications on file  Previous Medications   ACETAMINOPHEN (TYLENOL) 500 MG TABLET    Take by mouth as needed.   ALBUTEROL (VENTOLIN HFA) 108 (90 BASE) MCG/ACT INHALER    Inhale 2 puffs into the lungs every 6 (six) hours as needed for wheezing or shortness of breath.   ALPRAZOLAM (XANAX) 0.25 MG TABLET    TAKE 1/2 TABLET BY MOUTH TWICE DAILY AS NEEDED FOR ANXIETY   AMLODIPINE (NORVASC) 5 MG TABLET    TAKE 1 TABLET BY MOUTH ONCE DAILY   AZELASTINE (ASTELIN) 0.1 % NASAL SPRAY    USE 1 SPRAY IN EACH NOSTRIL TWICE DAILY   CETIRIZINE (ZYRTEC) 10 MG TABLET    Take 10 mg by mouth daily.    CHOLECALCIFEROL (VITAMIN D3) 5000 UNITS TABS    5,000 IU OTC vitamin D3 daily.   CYANOCOBALAMIN (VITAMIN B-12) 2500 MCG TABS    Take 1,250 mcg by mouth daily.   DOCUSATE SODIUM (COLACE)  100 MG CAPSULE    Take 100 mg by mouth daily as needed.   DULOXETINE (CYMBALTA) 30 MG CAPSULE    Take 1 capsule (30 mg total) by mouth daily.   DULOXETINE (CYMBALTA) 60 MG CAPSULE    Take 60 mg by mouth daily. Take with 30 mg tablet   ESCITALOPRAM (LEXAPRO) 10 MG TABLET    Take 10 mg by mouth at bedtime.   FLUTICASONE (FLONASE) 50 MCG/ACT NASAL SPRAY    USE 2 SPRAYS INTO EACH NOSTRIL EVERY DAY   LOSARTAN (COZAAR) 100 MG TABLET    TAKE 1 TABLET BY MOUTH DAILY   MIRABEGRON ER (MYRBETRIQ) 25 MG TB24 TABLET    1 tablet Orally Once a day   MONTELUKAST (SINGULAIR) 10 MG TABLET    TAKE 1 TABLET BY MOUTH DAILY AT BEDTIME   OMEPRAZOLE (PRILOSEC) 20 MG CAPSULE    TAKE 1 CAPSULE BY MOUTH DAILY   SILVER SULFADIAZINE (SILVADENE) 1 % CREAM    Apply 1 Application topically daily.   SYMBICORT 80-4.5 MCG/ACT INHALER    INHALE 2 PUFFS BY MOUTH INTO THE LUNGS TWICE A DAY  Modified Medications   No medications on file  Discontinued Medications   No medications on file    Physical Exam:  There were no vitals filed for this visit. There is no height or weight on file to calculate BMI. Wt Readings from Last 3 Encounters:  03/13/23 134 lb 3.2 oz  (60.9 kg)  02/25/23 136 lb 3.2 oz (61.8 kg)  12/10/22 137 lb (62.1 kg)    Physical Exam Constitutional:      Appearance: Normal appearance.  Pulmonary:     Effort: Pulmonary effort is normal.  Abdominal:     General: Bowel sounds are normal.     Palpations: Abdomen is soft.     Tenderness: There is no abdominal tenderness. There is no right CVA tenderness, left CVA tenderness or guarding.  Skin:    General: Skin is warm and dry.  Neurological:     Mental Status: She is alert. Mental status is at baseline.  Psychiatric:        Mood and Affect: Mood normal.     Labs reviewed: Basic Metabolic Panel: Recent Labs    04/04/22 1712 02/25/23 1530  NA 136 133*  K 5.0 4.7  CL 100 97  CO2 27 22  GLUCOSE 101* 88  BUN 19 12  CREATININE 0.75 0.71  CALCIUM 9.1 9.1  MG  --  1.9  TSH  --  2.310   Liver Function Tests: No results for input(s): "AST", "ALT", "ALKPHOS", "BILITOT", "PROT", "ALBUMIN" in the last 8760 hours. No results for input(s): "LIPASE", "AMYLASE" in the last 8760 hours. No results for input(s): "AMMONIA" in the last 8760 hours. CBC: Recent Labs    04/04/22 1712 02/25/23 1530  WBC 7.6 8.6  NEUTROABS 5.2  --   HGB 13.0 12.4  HCT 40.0 37.0  MCV 94.3 94  PLT 195 190   Lipid Panel: No results for input(s): "CHOL", "HDL", "LDLCALC", "TRIG", "CHOLHDL", "LDLDIRECT" in the last 8760 hours. TSH: Recent Labs    02/25/23 1530  TSH 2.310   A1C: Lab Results  Component Value Date   HGBA1C 6.0 (H) 03/29/2019     Assessment/Plan 1. Dysuria (Primary) With burning on urination  - nitrofurantoin, macrocrystal-monohydrate, (MACROBID) 100 MG capsule; Take 1 capsule (100 mg total) by mouth 2 (two) times daily.  Dispense: 14 capsule; Refill: 0 - Culture, Urine - Urinalysis with  Reflex Microscopic- with leukocytosis and blood noted, will send for culture   Luci Bellucci K. Biagio Borg [facility] [phone]

## 2023-03-21 LAB — URINALYSIS, ROUTINE W REFLEX MICROSCOPIC
Bilirubin Urine: NEGATIVE
Glucose, UA: NEGATIVE
Ketones, ur: NEGATIVE
Nitrite: NEGATIVE
Specific Gravity, Urine: 1.007 (ref 1.001–1.035)
Squamous Epithelial / HPF: NONE SEEN /[HPF] (ref <=5)
WBC, UA: >=60 /[HPF] — AB (ref 0–5)
pH: 6.5 (ref 5.0–8.0)

## 2023-03-21 LAB — URINE CULTURE
MICRO NUMBER:: 15890694
SPECIMEN QUALITY:: ADEQUATE

## 2023-03-21 LAB — MICROSCOPIC MESSAGE

## 2023-03-25 DIAGNOSIS — L80 Vitiligo: Secondary | ICD-10-CM | POA: Diagnosis not present

## 2023-03-25 DIAGNOSIS — R002 Palpitations: Secondary | ICD-10-CM | POA: Diagnosis not present

## 2023-03-25 DIAGNOSIS — D692 Other nonthrombocytopenic purpura: Secondary | ICD-10-CM | POA: Diagnosis not present

## 2023-03-25 DIAGNOSIS — D1801 Hemangioma of skin and subcutaneous tissue: Secondary | ICD-10-CM | POA: Diagnosis not present

## 2023-03-25 DIAGNOSIS — L821 Other seborrheic keratosis: Secondary | ICD-10-CM | POA: Diagnosis not present

## 2023-03-31 ENCOUNTER — Ambulatory Visit (HOSPITAL_COMMUNITY)
Admission: RE | Admit: 2023-03-31 | Discharge: 2023-03-31 | Disposition: A | Discharge status: Home / Self Care | Payer: Medicare Other | Source: Ambulatory Visit | Attending: Cardiology | Admitting: Cardiology

## 2023-03-31 DIAGNOSIS — R0602 Shortness of breath: Secondary | ICD-10-CM | POA: Insufficient documentation

## 2023-03-31 LAB — ECHOCARDIOGRAM COMPLETE
AR max vel: 1.79 cm2
AV Area VTI: 1.64 cm2
AV Area mean vel: 1.73 cm2
AV Mean grad: 4 mm[Hg]
AV Peak grad: 8.1 mm[Hg]
AV Vena cont: 0.24 cm
Ao pk vel: 1.42 m/s
Area-P 1/2: 3.68 cm2
MV M vel: 3.27 m/s
MV Peak grad: 42.8 mm[Hg]
P 1/2 time: 368 ms
S' Lateral: 3.7 cm

## 2023-04-01 ENCOUNTER — Telehealth: Payer: Self-pay

## 2023-04-01 DIAGNOSIS — N3020 Other chronic cystitis without hematuria: Secondary | ICD-10-CM | POA: Diagnosis not present

## 2023-04-01 DIAGNOSIS — I493 Ventricular premature depolarization: Secondary | ICD-10-CM

## 2023-04-01 DIAGNOSIS — R8271 Bacteriuria: Secondary | ICD-10-CM | POA: Diagnosis not present

## 2023-04-01 NOTE — Telephone Encounter (Signed)
-----   Message from Katlyn D West sent at 04/01/2023  8:09 AM EST ----- Please let Ms. Breisch know that her cardiac monitor showed episodes of short and fast heart beats originating from the upper chambers of the heart. She had very frequent early beats from the bottom chambers of the heart. Discussed with Dr. Court, recommended starting metoprolol  succinate 25 mg daily and urgent referral to EP for frequent PVCs.   Her echocardiogram showed reduced heart squeeze and function. There was mild thickening and stiffening of the left lower chamber of the heart. Also discussed with Dr. Court, question if reduced function is related to very high burden of PVCs. Will discuss monitor and echo results further at follow up on 04/10/23.

## 2023-04-01 NOTE — Telephone Encounter (Signed)
 Left message to call back

## 2023-04-02 MED ORDER — METOPROLOL SUCCINATE ER 25 MG PO TB24: 25.0000 mg | ORAL_TABLET | Freq: Every day | ORAL | 3 refills | Status: DC

## 2023-04-02 NOTE — Telephone Encounter (Signed)
 Called patient advised of below they verbalized understanding  Sent medication into pharmacy and sent urgent referral to EP

## 2023-04-02 NOTE — Telephone Encounter (Signed)
-----   Message from Katlyn D West sent at 04/01/2023  8:09 AM EST ----- Please let [name] know that her cardiac monitor showed episodes of short and fast heart beats originating from the upper chambers of the heart. She had very frequent early beats from the bottom chambers of the heart. Discussed with Dr. Court, recommended starting metoprolol  succinate 25 mg daily and urgent referral to EP for frequent PVCs.   Her echocardiogram showed reduced heart squeeze and function. There was mild thickening and stiffening of the left lower chamber of the heart. Also discussed with Dr. Court, question if reduced function is related to very high burden of PVCs. Will discuss monitor and echo results further at follow up on 04/10/23.

## 2023-04-08 ENCOUNTER — Ambulatory Visit: Payer: Medicare Other | Admitting: Cardiology

## 2023-04-09 NOTE — Progress Notes (Signed)
Cardiology Office Note    Date:  04/10/2023  ID:  [name], DOB 10-07-1933, MRN [id number] PCP:  Melida Quitter, MD  Cardiologist:  Nanetta Batty, MD  Electrophysiologist:  None   Chief Complaint: Frequent PVCs  History of Present Illness: [name] is a 89 y.o. female with visit-pertinent history of  hyperlipidemia, hypertension, type 2 diabetes mellitus.    Patient had ABIs in 04/2017 that were within normal range.  In 01/2018 she presented with atypical chest pain and asthma type symptoms.  She did Myoview stress test that was low risk and nonischemic and echo revealed normal LV function with anteroapical wall motion abnormality.  She was last seen in clinic by Dr. Allyson Sabal on 12/10/2022, it was noted that she has complaints of chronic fatigue, her EKG showed bigeminal PVCs, at that time she did not wish to have further workup.  Patient presented on 02/25/2023, she endorsed intermittent palpitations described as skipped beat.  She denied any associated symptoms.  She did report some ongoing fatigue although felt that it was related more so to not sleeping well at night.  She denied any chest pain or new or worsening shortness of breath.  Patient cardiac monitor that she wore for 13 days and 17 hours indicated an average heart rate of 81 bpm, ranging from 57 to 200 bpm.  Predominant underlying rhythm was sinus rhythm.  She had 2 runs of ventricular tachycardia, 86 runs of supraventricular tachycardia she had very frequent episodes of PVCs with a 25.8% burden.  She was started on metoprolol succinate 25 mg daily with referral to EP for frequent PVCs.  Her echocardiogram on 03/31/2023 indicated LVEF of 35 to 40%, she did have wall motion abnormalities, mild LVH, grade 1 diastolic dysfunction, she had mildly dilated LA, mild to moderate mitral valve regurgitation with no evidence of stenosis, there was aortic valve sclerosis present with no evidence of aortic valve stenosis.   Today  she presents for follow-up.  She reports that she is doing okay, she notes ongoing fatigue.  Her daughter notes that she does currently have a bladder infection that has been ongoing for a few weeks and she has been on numerous antibiotics.  Patient reports that she has not seen a significant difference in how she feels since starting on metoprolol, she notes that when she was wearing her cardiac monitor she suddenly felt as if her palpitations resolved.  She denies chest pain, notes some stable shortness of breath.  She denies lower extremity edema.  She notes that she has a history of some intermittent chest tightness that is relieved with Xanax, her and her daughter report that she is extremely anxious.  She reports that she is unable to sleep some nights that she is unable to fall asleep.  Labwork independently reviewed: 02/25/2023: Sodium 133, potassium 4.7, creatinine 0.71, magnesium 1.9, hemoglobin 12.4, hematocrit 37, TSH 2.310  ROS: .   Today she denies chest pain, shortness of breath, lower extremity edema, fatigue, palpitations, melena, hematuria, hemoptysis, diaphoresis, weakness, presyncope, syncope, orthopnea, and PND.  All other systems are reviewed and otherwise negative.  Studies Reviewed: Marland Kitchen    EKG:  EKG is ordered today, personally reviewed, demonstrating  EKG Interpretation Date/Time:  Thursday April 10 2023 11:03:48 EST Ventricular Rate:  64 PR Interval:  160 QRS Duration:  142 QT Interval:  506 QTC Calculation: 522 R Axis:   -42  Text Interpretation: Sinus rhythm with occasional Premature ventricular  complexes Left axis deviation Left bundle branch block Confirmed by Reather Littler [phone]) on 04/10/2023 4:57:35 PM   CV Studies:  Cardiac Studies & Procedures     STRESS TESTS  MYOCARDIAL PERFUSION IMAGING 02/11/2018  Narrative  Nuclear stress EF: 56%.  There was no ST segment deviation noted during stress.  This is a low risk study.  The left ventricular ejection  fraction is normal (55-65%).  1. EF 56%, normal wall motion. 2. Fixed small, mild apical perfusion defect.  Given normal wall motion, suspect attenuation artifact.  No ischemia.  Low risk study.  ECHOCARDIOGRAM  ECHOCARDIOGRAM COMPLETE 03/31/2023  Narrative ECHOCARDIOGRAM REPORT    Patient Name:   [name] Date of Exam: 03/31/2023 Medical Rec #:  [id number]        Height:       64.0 in Accession #:    [id number]       Weight:       134.2 lb Date of Birth:  10-Feb-1934         BSA:          1.651 m Patient Age:    89 years         BP:           124/67 mmHg Patient Gender: F                HR:           69 bpm. Exam Location:  Outpatient  Procedure: 2D Echo, 3D Echo, Cardiac Doppler, Color Doppler and Strain Analysis  Indications:    Shortness of breath [R06.02 (ICD-10-CM)]  History:        Patient has prior history of Echocardiogram examinations, most recent 02/11/2018. Risk Factors:Hypertension and Dyslipidemia.  Sonographer:    Gertie Fey MHA, RDMS, RVT, RDCS Referring Phys: [id number] [facility] D Lendy Dittrich   Sonographer Comments: Restricted mobility. IMPRESSIONS   1. Left ventricular ejection fraction, by estimation, is 35 to 40%. Left ventricular ejection fraction by 3D volume is 41 %. The left ventricle has moderately decreased function. The left ventricle demonstrates regional wall motion abnormalities (see scoring diagram/findings for description). There is mild left ventricular hypertrophy. Left ventricular diastolic parameters are consistent with Grade I diastolic dysfunction (impaired relaxation). 2. Right ventricular systolic function is normal. The right ventricular size is normal. Tricuspid regurgitation signal is inadequate for assessing PA pressure. 3. Mildly reduced LA reservoir strain 27%. Left atrial size was mildly dilated. 4. The mitral valve is degenerative. Mild to moderate mitral valve regurgitation. No evidence of mitral stenosis. 5. The aortic valve is  grossly normal. There is mild thickening of the aortic valve. Aortic valve regurgitation is trivial. Aortic valve sclerosis is present, with no evidence of aortic valve stenosis. 6. The inferior vena cava is dilated in size with >50% respiratory variability, suggesting right atrial pressure of 8 mmHg.  FINDINGS Left Ventricle: Left ventricular ejection fraction, by estimation, is 35 to 40%. Left ventricular ejection fraction by 3D volume is 41 %. The left ventricle has moderately decreased function. The left ventricle demonstrates regional wall motion abnormalities. The left ventricular internal cavity size was normal in size. There is mild left ventricular hypertrophy. Abnormal (paradoxical) septal motion, consistent with left bundle branch block. Left ventricular diastolic parameters are consistent with Grade I diastolic dysfunction (impaired relaxation).   LV Wall Scoring: The entire septum and apical inferior segment are hypokinetic.  Right Ventricle: The right ventricular size is normal. No increase in right ventricular wall thickness. Right  ventricular systolic function is normal. Tricuspid regurgitation signal is inadequate for assessing PA pressure.  Left Atrium: Mildly reduced LA reservoir strain 27%. Left atrial size was mildly dilated.  Right Atrium: Right atrial size was normal in size.  Pericardium: There is no evidence of pericardial effusion.  Mitral Valve: The mitral valve is degenerative in appearance. There is moderate calcification of the mitral valve leaflet(s). Mild to moderate mitral valve regurgitation. No evidence of mitral valve stenosis.  Tricuspid Valve: The tricuspid valve is normal in structure. Tricuspid valve regurgitation is trivial. No evidence of tricuspid stenosis.  Aortic Valve: The aortic valve is grossly normal. There is mild thickening of the aortic valve. Aortic valve regurgitation is trivial. Aortic regurgitation PHT measures 368 msec. Aortic valve  sclerosis is present, with no evidence of aortic valve stenosis. Aortic valve mean gradient measures 4.0 mmHg. Aortic valve peak gradient measures 8.1 mmHg. Aortic valve area, by VTI measures 1.64 cm.  Pulmonic Valve: The pulmonic valve was normal in structure. Pulmonic valve regurgitation is trivial. No evidence of pulmonic stenosis.  Aorta: The aortic root is normal in size and structure.  Venous: The inferior vena cava is dilated in size with greater than 50% respiratory variability, suggesting right atrial pressure of 8 mmHg.  IAS/Shunts: No atrial level shunt detected by color flow Doppler.   LEFT VENTRICLE PLAX 2D LVIDd:         4.60 cm         Diastology LVIDs:         3.70 cm         LV e' medial:    4.79 cm/s LV PW:         1.03 cm         LV E/e' medial:  17.3 LV IVS:        0.89 cm         LV e' lateral:   6.42 cm/s LVOT diam:     1.78 cm         LV E/e' lateral: 12.9 LV SV:         51 LV SV Index:   31 LVOT Area:     2.49 cm        3D Volume EF LV 3D EF:    Left ventricul ar ejection fraction by 3D volume is 41 %.  3D Volume EF: 3D EF:        41 % LV EDV:       150 ml LV ESV:       88 ml LV SV:        62 ml  RIGHT VENTRICLE RV Basal diam:  2.73 cm RV Mid diam:    2.17 cm RV S prime:     14.70 cm/s TAPSE (M-mode): 1.8 cm  LEFT ATRIUM             Index        RIGHT ATRIUM           Index LA diam:        2.91 cm 1.76 cm/m   RA Area:     11.10 cm LA Vol (A2C):   39.0 ml 23.62 ml/m  RA Volume:   21.10 ml  12.78 ml/m LA Vol (A4C):   38.4 ml 23.26 ml/m LA Biplane Vol: 40.0 ml 24.22 ml/m AORTIC VALVE AV Area (Vmax):    1.79 cm AV Area (Vmean):   1.73 cm AV Area (VTI):     1.64 cm AV  Vmax:           142.00 cm/s AV Vmean:          97.200 cm/s AV VTI:            0.312 m AV Peak Grad:      8.1 mmHg AV Mean Grad:      4.0 mmHg LVOT Vmax:         102.00 cm/s LVOT Vmean:        67.400 cm/s LVOT VTI:          0.206 m LVOT/AV VTI ratio: 0.66 AI PHT:             368 msec AR Vena Contracta: 0.24 cm  AORTA Ao Root diam: 2.71 cm Ao Asc diam:  2.66 cm  MITRAL VALVE MV Area (PHT): 3.68 cm     SHUNTS MV Decel Time: 206 msec     Systemic VTI:  0.21 m MR Peak grad: 42.8 mmHg     Systemic Diam: 1.78 cm MR Mean grad: 19.0 mmHg MR Vmax:      327.00 cm/s MR Vmean:     196.0 cm/s MV E velocity: 82.70 cm/s MV A velocity: 118.00 cm/s MV E/A ratio:  0.70  Weston Brass MD Electronically signed by Weston Brass MD Signature Date/Time: 03/31/2023/1:14:07 PM    Final   MONITORS  LONG TERM MONITOR (3-14 DAYS) 03/25/2023  Narrative Patch Wear Time:  13 days and 17 hours (2024-12-09T16:47:13-0500 to 2024-12-23T10:41:46-0500)  Patient had a min HR of 57 bpm, max HR of 200 bpm, and avg HR of 81 bpm. Predominant underlying rhythm was Sinus Rhythm. Bundle Branch Block/IVCD was present. 2 Ventricular Tachycardia runs occurred, the run with the fastest interval lasting 5 beats with a max rate of 200 bpm, the longest lasting 4 beats with an avg rate of 141 bpm. 86 Supraventricular Tachycardia runs occurred, the run with the fastest interval lasting 8 beats with a max rate of 169 bpm, the longest lasting 27.8 secs with an avg rate of 110 bpm. Isolated SVEs were rare (<1.0%), SVE Couplets were rare (<1.0%), and SVE Triplets were rare (<1.0%). Isolated VEs were frequent (25.8%, [id number]), VE Couplets were rare (<1.0%, 4350), and VE Triplets were rare (<1.0%, 11). Ventricular Bigeminy and Trigeminy were present.  SR/SB/ST Freq PVCs (25% burden) Occasional PACs Short runs of SVT/NSVT Needs ROV to discuss with me or an APP           Current Reported Medications:.    Current Meds  Medication Sig   acetaminophen (TYLENOL) 500 MG tablet Take by mouth as needed.   albuterol (VENTOLIN HFA) 108 (90 Base) MCG/ACT inhaler Inhale 2 puffs into the lungs every 6 (six) hours as needed for wheezing or shortness of breath.   ALPRAZolam (XANAX) 0.25 MG tablet  TAKE 1/2 TABLET BY MOUTH TWICE DAILY AS NEEDED FOR ANXIETY   amLODipine (NORVASC) 5 MG tablet TAKE 1 TABLET BY MOUTH ONCE DAILY   azelastine (ASTELIN) 0.1 % nasal spray USE 1 SPRAY IN EACH NOSTRIL TWICE DAILY   cetirizine (ZYRTEC) 10 MG tablet Take 10 mg by mouth daily.    Cholecalciferol (VITAMIN D3) 5000 units TABS 5,000 IU OTC vitamin D3 daily.   ciprofloxacin (CIPRO) 500 MG tablet Take 500 mg by mouth 2 (two) times daily.   Cyanocobalamin (VITAMIN B-12) 2500 MCG TABS Take 1,250 mcg by mouth daily.   docusate sodium (COLACE) 100 MG capsule Take 100 mg by mouth daily as needed.   escitalopram (  LEXAPRO) 10 MG tablet Take 10 mg by mouth at bedtime.   fluticasone (FLONASE) 50 MCG/ACT nasal spray USE 2 SPRAYS INTO EACH NOSTRIL EVERY DAY   losartan (COZAAR) 100 MG tablet TAKE 1 TABLET BY MOUTH DAILY   metoprolol succinate (TOPROL XL) 25 MG 24 hr tablet Take 1 tablet (25 mg total) by mouth daily.   montelukast (SINGULAIR) 10 MG tablet TAKE 1 TABLET BY MOUTH DAILY AT BEDTIME   omeprazole (PRILOSEC) 20 MG capsule TAKE 1 CAPSULE BY MOUTH DAILY   silver sulfADIAZINE (SILVADENE) 1 % cream Apply 1 Application topically daily.   SYMBICORT 80-4.5 MCG/ACT inhaler INHALE 2 PUFFS BY MOUTH INTO THE LUNGS TWICE A DAY   Physical Exam:    VS:  BP (!) 130/52   Pulse 66   Ht 5\' 3"  (1.6 m)   Wt 135 lb (61.2 kg)   SpO2 96%   BMI 23.91 kg/m    Wt Readings from Last 3 Encounters:  04/10/23 135 lb (61.2 kg)  03/13/23 134 lb 3.2 oz (60.9 kg)  02/25/23 136 lb 3.2 oz (61.8 kg)    GEN: Well nourished, well developed in no acute distress NECK: No JVD; No carotid bruits CARDIAC: RRR, no murmurs, rubs, gallops RESPIRATORY:  Clear to auscultation without rales, wheezing or rhonchi  ABDOMEN: Soft, non-tender, non-distended EXTREMITIES:  No edema; No acute deformity   Asessement and Plan:.    Frequent PVCs/LBBB: Patient cardiac monitor that she wore for 13 days and 17 hours indicated an average heart rate of 81  bpm, ranging from 57 to 200 bpm.  Predominant underlying rhythm was sinus rhythm.  She had 2 runs of ventricular tachycardia, 86 runs of supraventricular tachycardia she had very frequent episodes of PVCs with a 25.8% burden.  She was started on metoprolol succinate 25 mg daily with referral to EP for frequent PVCs.  Today she reports that she has not felt any different since starting on metoprolol, she feels that when she was wearing her cardiac monitor her palpitations resolved.  EKG today indicates sinus rhythm at 64 bpm with occasional PVCs and left bundle branch block.  She has follow-up with EP next week.  Continue metoprolol succinate 25 mg daily.  HFrEF: Echo in 01/2028 with LVEF of 55 to 60%, akinesis of the mid anteroseptal myocardium, grade 1 DD.  Echocardiogram on 03/31/2023 indicated LVEF of 35 to 40%, she did have wall motion abnormalities, mild LVH, grade 1 diastolic dysfunction.  Discussed with Dr. Allyson Sabal, felt to likely be related to high PVC burden, recommended urgent referral to EP. Discussed ischemic evaluation with patient, she deferred for now but however is open to discussion of a nuclear stress test which she has had in the past, at follow-up visit.  She denies any chest pain, lower extremity edema. She notes that she has some shortness of breath that is felt to be chronic and stable.  Discussed transitioning from losartan to [facility], patient deferred for now as she currently feels unwell due to a bladder infection.  Given recurrent bladder infections and UTIs she is not a good candidate for an SGLT2 inhibitor and she reports that she would decline any medication that would potentially have a diuretic effect as she does have some incontinence.  Reviewed ED precautions.  Continue amlodipine 5 mg daily, losartan 100 mg daily and metoprolol succinate 25 mg daily.  Hyperlipidemia: Per Dr. Renelda Mom note her last lipid profile in 09/2021 revealed total cholesterol 300, LDL 194 and HDL of 91. History  story of statin intolerance, patient again reports she is not interested in pursuing lipid-lowering therapy.   Hypertension: Blood pressure today 130/52.  Continue current antihypertensive regimen.    Disposition: F/u with Reather Littler, NP in four to six weeks.   Signed, Rip Harbour, NP

## 2023-04-10 ENCOUNTER — Ambulatory Visit: Payer: Medicare Other | Attending: Cardiology | Admitting: Cardiology

## 2023-04-10 ENCOUNTER — Encounter: Payer: Self-pay | Admitting: Cardiology

## 2023-04-10 VITALS — BP 130/52 | HR 66 | Ht 63.0 in | Wt 135.0 lb

## 2023-04-10 DIAGNOSIS — I493 Ventricular premature depolarization: Secondary | ICD-10-CM | POA: Insufficient documentation

## 2023-04-10 DIAGNOSIS — I10 Essential (primary) hypertension: Secondary | ICD-10-CM | POA: Insufficient documentation

## 2023-04-10 DIAGNOSIS — I447 Left bundle-branch block, unspecified: Secondary | ICD-10-CM | POA: Insufficient documentation

## 2023-04-10 DIAGNOSIS — R0602 Shortness of breath: Secondary | ICD-10-CM | POA: Diagnosis not present

## 2023-04-10 DIAGNOSIS — E782 Mixed hyperlipidemia: Secondary | ICD-10-CM | POA: Diagnosis not present

## 2023-04-10 DIAGNOSIS — I5020 Unspecified systolic (congestive) heart failure: Secondary | ICD-10-CM | POA: Diagnosis present

## 2023-04-10 DIAGNOSIS — R002 Palpitations: Secondary | ICD-10-CM | POA: Insufficient documentation

## 2023-04-10 NOTE — Patient Instructions (Signed)
Medication Instructions:  No changes *If you need a refill on your cardiac medications before your next appointment, please call your pharmacy*  Lab Work: No labs If you have labs (blood work) drawn today and your tests are completely normal, you will receive your results only by: MyChart Message (if you have MyChart) OR A paper copy in the mail If you have any lab test that is abnormal or we need to change your treatment, we will call you to review the results.  Testing/Procedures: No testing  Follow-Up: At [facility], you and your health needs are our priority.  As part of our continuing mission to provide you with exceptional heart care, we have created designated Provider Care Teams.  These Care Teams include your primary Cardiologist (physician) and Advanced Practice Providers (APPs -  Physician Assistants and Nurse Practitioners) who all work together to provide you with the care you need, when you need it.  We recommend signing up for the patient portal called "MyChart".  Sign up information is provided on this After Visit Summary.  MyChart is used to connect with patients for Virtual Visits (Telemedicine).  Patients are able to view lab/test results, encounter notes, upcoming appointments, etc.  Non-urgent messages can be sent to your provider as well.   To learn more about what you can do with MyChart, go to [email or web address].    Your next appointment:   4-6 week(s)  Provider:   Reather Littler, NP    Then, Nanetta Batty, MD will plan to see you again in 3 month(s).

## 2023-04-14 DIAGNOSIS — M25511 Pain in right shoulder: Secondary | ICD-10-CM | POA: Diagnosis not present

## 2023-04-14 DIAGNOSIS — M545 Low back pain, unspecified: Secondary | ICD-10-CM | POA: Diagnosis not present

## 2023-04-14 DIAGNOSIS — G8929 Other chronic pain: Secondary | ICD-10-CM | POA: Diagnosis not present

## 2023-04-14 DIAGNOSIS — M6281 Muscle weakness (generalized): Secondary | ICD-10-CM | POA: Diagnosis not present

## 2023-04-16 DIAGNOSIS — M25511 Pain in right shoulder: Secondary | ICD-10-CM | POA: Diagnosis not present

## 2023-04-16 DIAGNOSIS — M545 Low back pain, unspecified: Secondary | ICD-10-CM | POA: Diagnosis not present

## 2023-04-16 DIAGNOSIS — M6281 Muscle weakness (generalized): Secondary | ICD-10-CM | POA: Diagnosis not present

## 2023-04-16 DIAGNOSIS — G8929 Other chronic pain: Secondary | ICD-10-CM | POA: Diagnosis not present

## 2023-04-18 ENCOUNTER — Ambulatory Visit: Payer: Medicare Other | Attending: Cardiovascular Disease | Admitting: Cardiovascular Disease

## 2023-04-18 ENCOUNTER — Encounter: Payer: Self-pay | Admitting: Cardiovascular Disease

## 2023-04-18 VITALS — BP 146/80 | HR 62 | Ht 63.0 in | Wt 134.8 lb

## 2023-04-18 DIAGNOSIS — R002 Palpitations: Secondary | ICD-10-CM

## 2023-04-18 DIAGNOSIS — I5020 Unspecified systolic (congestive) heart failure: Secondary | ICD-10-CM

## 2023-04-18 DIAGNOSIS — I447 Left bundle-branch block, unspecified: Secondary | ICD-10-CM

## 2023-04-18 DIAGNOSIS — I493 Ventricular premature depolarization: Secondary | ICD-10-CM

## 2023-04-18 MED ORDER — AMIODARONE HCL 200 MG PO TABS: ORAL_TABLET | ORAL | 3 refills | Status: DC

## 2023-04-18 NOTE — Progress Notes (Signed)
  Electrophysiology Office Note:    Date:  04/18/2023   ID:  [name], DOB 1934-03-02, MRN [id number]  PCP:  Melida Quitter, MD   [facility] Providers Cardiologist:  Nanetta Batty, MD Electrophysiologist:  Maurice Small, MD     Referring MD: Rip Harbour, NP   History of Present Illness:    [name] is a 89 y.o. female with a medical history significant for CHFrEF, HTN, DM, referred for PVC management.     She had a normal stress test in 2019.  In 2024 she was seen by Dr. Gery Pray for fatigue and noted to be in bigeminal PVCs.  She has been reluctant to have an aggressive evaluation due to her advanced age.  She returned with ongoing palpitations and fatigue.  Monitor showed frequent PVCs at 25.8% burden.  Metoprolol was started.  Echocardiogram showed an ejection fraction of 35 to 40% with wall motion abnormalities.  She reports that her symptoms of palpitations improved after starting metoprolol      Today, she feels reasonly well.  She does not have palpitations, but she does have ongoing fatigue and shortness of breath.  EKGs/Labs/Other Studies Reviewed Today:     Echocardiogram:  TTE January 2025 EF 35 to 40%.  Mild LVH.  Grade 1 diastolic dysfunction.   Monitors:  14 day monitor 02/2023  -- my interpretation Sinus rhythm predominates.  Heart rate 57 to 121 bpm, average 77 bpm Frequent PVCs, 25.8% burden Patient symptom-triggered events correlated with PVCs    EKG:   EKG Interpretation Date/Time:  Friday April 18 2023 12:34:05 EST Ventricular Rate:  62 PR Interval:  150 QRS Duration:  140 QT Interval:  498 QTC Calculation: 505 R Axis:   172  Text Interpretation: Sinus rhythm with occasional Premature ventricular complexes Left bundle branch block When compared with ECG of 10-Apr-2023 11:03, Otherwise no significant change Confirmed by York Pellant [phone]) on 04/18/2023 1:04:54 PM     Physical Exam:    VS:  BP (!) 146/80  (BP Location: Left Arm, Patient Position: Sitting, Cuff Size: Normal)   Pulse 62   Ht 5\' 3"  (1.6 m)   Wt 134 lb 12.8 oz (61.1 kg)   SpO2 95%   BMI 23.88 kg/m     Wt Readings from Last 3 Encounters:  04/18/23 134 lb 12.8 oz (61.1 kg)  04/10/23 135 lb (61.2 kg)  03/13/23 134 lb 3.2 oz (60.9 kg)     GEN: Well nourished, well developed in no acute distress CARDIAC: RRR, no murmurs, rubs, gallops RESPIRATORY:  Normal work of breathing MUSCULOSKELETAL: no edema    ASSESSMENT & PLAN:     Frequent PVCs Possibly contributing to CHFrEF Will start amiodarone 200mg  BID for 2 weeks, then 200mg  daily Would consider ischemic evaluation if revascularization is an option   Heart failure with reduced ejection fraction EF 35-40% Continue metoprolol XL 25 She may benefit from further titration of GDMT, though benefit is not established -- to my knowledge -- at her age   Signed, Maurice Small, MD  04/18/2023 5:30 PM    [facility]

## 2023-04-18 NOTE — Patient Instructions (Signed)
Medication Instructions:  START Amiodarone 200 mg twice daily for 14 days, then DECREASE to 200 mg ONCE daily *If you need a refill on your cardiac medications before your next appointment, please call your pharmacy*   Follow-Up: At [facility], you and your health needs are our priority.  As part of our continuing mission to provide you with exceptional heart care, we have created designated Provider Care Teams.  These Care Teams include your primary Cardiologist (physician) and Advanced Practice Providers (APPs -  Physician Assistants and Nurse Practitioners) who all work together to provide you with the care you need, when you need it.  We recommend signing up for the patient portal called "MyChart".  Sign up information is provided on this After Visit Summary.  MyChart is used to connect with patients for Virtual Visits (Telemedicine).  Patients are able to view lab/test results, encounter notes, upcoming appointments, etc.  Non-urgent messages can be sent to your provider as well.   To learn more about what you can do with MyChart, go to [email or web address].    Your next appointment:   2 month(s)  Provider:   York Pellant, MD

## 2023-04-21 DIAGNOSIS — M6281 Muscle weakness (generalized): Secondary | ICD-10-CM | POA: Diagnosis not present

## 2023-04-21 DIAGNOSIS — G8929 Other chronic pain: Secondary | ICD-10-CM | POA: Diagnosis not present

## 2023-04-21 DIAGNOSIS — M545 Low back pain, unspecified: Secondary | ICD-10-CM | POA: Diagnosis not present

## 2023-04-21 DIAGNOSIS — M25511 Pain in right shoulder: Secondary | ICD-10-CM | POA: Diagnosis not present

## 2023-04-23 DIAGNOSIS — G8929 Other chronic pain: Secondary | ICD-10-CM | POA: Diagnosis not present

## 2023-04-23 DIAGNOSIS — M25511 Pain in right shoulder: Secondary | ICD-10-CM | POA: Diagnosis not present

## 2023-04-23 DIAGNOSIS — M6281 Muscle weakness (generalized): Secondary | ICD-10-CM | POA: Diagnosis not present

## 2023-04-23 DIAGNOSIS — M545 Low back pain, unspecified: Secondary | ICD-10-CM | POA: Diagnosis not present

## 2023-04-28 DIAGNOSIS — M545 Low back pain, unspecified: Secondary | ICD-10-CM | POA: Diagnosis not present

## 2023-04-28 DIAGNOSIS — G8929 Other chronic pain: Secondary | ICD-10-CM | POA: Diagnosis not present

## 2023-04-28 DIAGNOSIS — M25511 Pain in right shoulder: Secondary | ICD-10-CM | POA: Diagnosis not present

## 2023-04-28 DIAGNOSIS — M6281 Muscle weakness (generalized): Secondary | ICD-10-CM | POA: Diagnosis not present

## 2023-04-30 DIAGNOSIS — M25511 Pain in right shoulder: Secondary | ICD-10-CM | POA: Diagnosis not present

## 2023-04-30 DIAGNOSIS — G8929 Other chronic pain: Secondary | ICD-10-CM | POA: Diagnosis not present

## 2023-04-30 DIAGNOSIS — M6281 Muscle weakness (generalized): Secondary | ICD-10-CM | POA: Diagnosis not present

## 2023-04-30 DIAGNOSIS — M545 Low back pain, unspecified: Secondary | ICD-10-CM | POA: Diagnosis not present

## 2023-05-05 DIAGNOSIS — M25511 Pain in right shoulder: Secondary | ICD-10-CM | POA: Diagnosis not present

## 2023-05-05 DIAGNOSIS — M545 Low back pain, unspecified: Secondary | ICD-10-CM | POA: Diagnosis not present

## 2023-05-05 DIAGNOSIS — G8929 Other chronic pain: Secondary | ICD-10-CM | POA: Diagnosis not present

## 2023-05-05 DIAGNOSIS — M6281 Muscle weakness (generalized): Secondary | ICD-10-CM | POA: Diagnosis not present

## 2023-05-07 DIAGNOSIS — G8929 Other chronic pain: Secondary | ICD-10-CM | POA: Diagnosis not present

## 2023-05-07 DIAGNOSIS — M25511 Pain in right shoulder: Secondary | ICD-10-CM | POA: Diagnosis not present

## 2023-05-07 DIAGNOSIS — M6281 Muscle weakness (generalized): Secondary | ICD-10-CM | POA: Diagnosis not present

## 2023-05-07 DIAGNOSIS — M545 Low back pain, unspecified: Secondary | ICD-10-CM | POA: Diagnosis not present

## 2023-05-12 DIAGNOSIS — M545 Low back pain, unspecified: Secondary | ICD-10-CM | POA: Diagnosis not present

## 2023-05-12 DIAGNOSIS — M25511 Pain in right shoulder: Secondary | ICD-10-CM | POA: Diagnosis not present

## 2023-05-12 DIAGNOSIS — M6281 Muscle weakness (generalized): Secondary | ICD-10-CM | POA: Diagnosis not present

## 2023-05-12 DIAGNOSIS — G8929 Other chronic pain: Secondary | ICD-10-CM | POA: Diagnosis not present

## 2023-05-13 DIAGNOSIS — M791 Myalgia, unspecified site: Secondary | ICD-10-CM | POA: Diagnosis not present

## 2023-05-13 DIAGNOSIS — M5136 Other intervertebral disc degeneration, lumbar region with discogenic back pain only: Secondary | ICD-10-CM | POA: Diagnosis not present

## 2023-05-14 DIAGNOSIS — M6281 Muscle weakness (generalized): Secondary | ICD-10-CM | POA: Diagnosis not present

## 2023-05-14 DIAGNOSIS — M545 Low back pain, unspecified: Secondary | ICD-10-CM | POA: Diagnosis not present

## 2023-05-14 DIAGNOSIS — M25511 Pain in right shoulder: Secondary | ICD-10-CM | POA: Diagnosis not present

## 2023-05-14 DIAGNOSIS — G8929 Other chronic pain: Secondary | ICD-10-CM | POA: Diagnosis not present

## 2023-05-16 DIAGNOSIS — N3020 Other chronic cystitis without hematuria: Secondary | ICD-10-CM | POA: Diagnosis not present

## 2023-05-16 DIAGNOSIS — N3946 Mixed incontinence: Secondary | ICD-10-CM | POA: Diagnosis not present

## 2023-05-16 DIAGNOSIS — R350 Frequency of micturition: Secondary | ICD-10-CM | POA: Diagnosis not present

## 2023-05-18 NOTE — Progress Notes (Unsigned)
 Cardiology Office Note    Date:  05/19/2023  ID:  [name], [name] 10-16-1933, MRN [id number] PCP:  Sonya Quitter, MD  Cardiologist:  Sonya Batty, MD  Electrophysiologist:  Sonya Small, MD   Chief Complaint: Follow up for HFrEF and PVCs   History of Present Illness: [name] is a 89 y.o. female with visit-pertinent history of hyperlipidemia, hypertension, type 2 diabetes mellitus.    Patient had ABIs in 04/2017 that were within normal range.  In 01/2018 she presented with atypical chest pain and asthma type symptoms.  She did Myoview stress test that was low risk and nonischemic and echo revealed normal LV function with anteroapical wall motion abnormality.  She was last seen in clinic by Dr. Allyson Barr on 12/10/2022, it was noted that she has complaints of chronic fatigue, her EKG showed bigeminal PVCs, at that time she did not wish to have further workup.  Patient presented on 02/25/2023, she endorsed intermittent palpitations described as skipped beat.  She denied any associated symptoms.  She did report some ongoing fatigue although felt that it was related more so to not sleeping well at night.  She denied any chest pain or new or worsening shortness of breath.  Patient cardiac monitor that she wore for 13 days and 17 hours indicated an average heart rate of 81 bpm, ranging from 57 to 200 bpm.  Predominant underlying rhythm was sinus rhythm.  She had 2 runs of ventricular tachycardia, 86 runs of supraventricular tachycardia she had very frequent episodes of PVCs with a 25.8% burden.  She was started on metoprolol succinate 25 mg daily with referral to EP for frequent PVCs.  Her echocardiogram on 03/31/2023 indicated LVEF of 35 to 40%, she did have wall motion abnormalities, mild LVH, grade 1 diastolic dysfunction, she had mildly dilated LA, mild to moderate mitral valve regurgitation with no evidence of stenosis, there was aortic valve sclerosis present with no evidence of  aortic valve stenosis.   Patient was last seen in office on 04/10/2023.  She reported that she was doing well, noted ongoing fatigue.  It was noted by her daughter that she did have a bladder infection that have been ongoing for a few weeks.  She denies chest pain, notes some stable shortness of breath.  She denies lower extremity edema.  She was seen in clinic on 04/14/2023 by Dr. Nelly Barr.  She reported that her palpitations had improved after starting on metoprolol.  She reported ongoing fatigue and shortness of breath.  Dr. Hyacinth Barr started her on amiodarone 200 mg twice daily for 2 weeks then 200 mg daily.  Today she presents for follow-up.  She reports that she is doing well. She denies palpitations, chest pain, new shortness of breath, lower extremity edema, orthopnea or pnd. She notes that she has significant anxiety, takes Xanax twice daily as needed, patient notes that she gets a tightness in her chest related to anxiety that is relieved when taking Xanax.  Patient reports that she plans to follow-up with her PCP regarding worsening anxiety.  She does not feel that this is cardiac chest pain in nature, notes does not occur during exertion.  She notes that her breathing is at baseline.  Patient notes that she has rather severe arthritis and has constant joint pain.  She reports that she has been tolerating metoprolol and amiodarone well, denies dizziness, lightheadedness, presyncope or syncope.  Patient continues to live independently at home.  ROS: .   Today she denies chest pain, shortness of breath, lower extremity edema, fatigue, palpitations, melena, hematuria, hemoptysis, diaphoresis, weakness, presyncope, syncope, orthopnea, and PND.  All other systems are reviewed and otherwise negative. Studies Reviewed: Marland Kitchen   EKG:  EKG is ordered today, personally reviewed, demonstrating  EKG Interpretation Date/Time:  Monday May 19 2023 11:02:15 EST Ventricular Rate:  57 PR Interval:  158 QRS  Duration:  148 QT Interval:  516 QTC Calculation: 502 R Axis:   -39  Text Interpretation: Sinus bradycardia Left axis deviation Left bundle branch block Premature ventricular complexes are no longer Present Confirmed by [name] [phone]) on 05/19/2023 11:06:12 AM    CV Studies:  Cardiac Studies & Procedures   ______________________________________________________________________________________________   STRESS TESTS  MYOCARDIAL PERFUSION IMAGING 02/11/2018  Narrative  Nuclear stress EF: 56%.  There was no ST segment deviation noted during stress.  This is a low risk study.  The left ventricular ejection fraction is normal (55-65%).  1. EF 56%, normal wall motion. 2. Fixed Barr, mild apical perfusion defect.  Given normal wall motion, suspect attenuation artifact.  No ischemia.  Low risk study.   ECHOCARDIOGRAM  ECHOCARDIOGRAM COMPLETE 03/31/2023  Narrative ECHOCARDIOGRAM REPORT    Patient Name:   [name] Date of Exam: 03/31/2023 Medical Rec #:  [id number]        Height:       64.0 in Accession #:    [id number]       Weight:       134.2 lb Date of Birth:  [birth year]         BSA:          1.651 m Patient Age:    89 years         BP:           124/67 mmHg Patient Gender: F                HR:           69 bpm. Exam Location:  Outpatient  Procedure: 2D Echo, 3D Echo, Cardiac Doppler, Color Doppler and Strain Analysis  Indications:    Shortness of breath [R06.02 (ICD-10-CM)]  History:        Patient has prior history of Echocardiogram examinations, most recent 02/11/2018. Risk Factors:Hypertension and Dyslipidemia.  Sonographer:    Gertie Fey MHA, RDMS, RVT, RDCS Referring Phys: [id number] [name] D [name]   Sonographer Comments: Restricted mobility. IMPRESSIONS   1. Left ventricular ejection fraction, by estimation, is 35 to 40%. Left ventricular ejection fraction by 3D volume is 41 %. The left ventricle has moderately decreased function. The left  ventricle demonstrates regional wall motion abnormalities (see scoring diagram/findings for description). There is mild left ventricular hypertrophy. Left ventricular diastolic parameters are consistent with Grade I diastolic dysfunction (impaired relaxation). 2. Right ventricular systolic function is normal. The right ventricular size is normal. Tricuspid regurgitation signal is inadequate for assessing PA pressure. 3. Mildly reduced LA reservoir strain 27%. Left atrial size was mildly dilated. 4. The mitral valve is degenerative. Mild to moderate mitral valve regurgitation. No evidence of mitral stenosis. 5. The aortic valve is grossly normal. There is mild thickening of the aortic valve. Aortic valve regurgitation is trivial. Aortic valve sclerosis is present, with no evidence of aortic valve stenosis. 6. The inferior vena cava is dilated in size with >50% respiratory variability, suggesting right atrial pressure of 8 mmHg.  FINDINGS Left Ventricle: Left ventricular ejection fraction, by estimation,  is 35 to 40%. Left ventricular ejection fraction by 3D volume is 41 %. The left ventricle has moderately decreased function. The left ventricle demonstrates regional wall motion abnormalities. The left ventricular internal cavity size was normal in size. There is mild left ventricular hypertrophy. Abnormal (paradoxical) septal motion, consistent with left bundle branch block. Left ventricular diastolic parameters are consistent with Grade I diastolic dysfunction (impaired relaxation).   LV Wall Scoring: The entire septum and apical inferior segment are hypokinetic.  Right Ventricle: The right ventricular size is normal. No increase in right ventricular wall thickness. Right ventricular systolic function is normal. Tricuspid regurgitation signal is inadequate for assessing PA pressure.  Left Atrium: Mildly reduced LA reservoir strain 27%. Left atrial size was mildly dilated.  Right Atrium: Right  atrial size was normal in size.  Pericardium: There is no evidence of pericardial effusion.  Mitral Valve: The mitral valve is degenerative in appearance. There is moderate calcification of the mitral valve leaflet(s). Mild to moderate mitral valve regurgitation. No evidence of mitral valve stenosis.  Tricuspid Valve: The tricuspid valve is normal in structure. Tricuspid valve regurgitation is trivial. No evidence of tricuspid stenosis.  Aortic Valve: The aortic valve is grossly normal. There is mild thickening of the aortic valve. Aortic valve regurgitation is trivial. Aortic regurgitation PHT measures 368 msec. Aortic valve sclerosis is present, with no evidence of aortic valve stenosis. Aortic valve mean gradient measures 4.0 mmHg. Aortic valve peak gradient measures 8.1 mmHg. Aortic valve area, by VTI measures 1.64 cm.  Pulmonic Valve: The pulmonic valve was normal in structure. Pulmonic valve regurgitation is trivial. No evidence of pulmonic stenosis.  Aorta: The aortic root is normal in size and structure.  Venous: The inferior vena cava is dilated in size with greater than 50% respiratory variability, suggesting right atrial pressure of 8 mmHg.  IAS/Shunts: No atrial level shunt detected by color flow Doppler.   LEFT VENTRICLE PLAX 2D LVIDd:         4.60 cm         Diastology LVIDs:         3.70 cm         LV e' medial:    4.79 cm/s LV PW:         1.03 cm         LV E/e' medial:  17.3 LV IVS:        0.89 cm         LV e' lateral:   6.42 cm/s LVOT diam:     1.78 cm         LV E/e' lateral: 12.9 LV SV:         51 LV SV Index:   31 LVOT Area:     2.49 cm        3D Volume EF LV 3D EF:    Left ventricul ar ejection fraction by 3D volume is 41 %.  3D Volume EF: 3D EF:        41 % LV EDV:       150 ml LV ESV:       88 ml LV SV:        62 ml  RIGHT VENTRICLE RV Basal diam:  2.73 cm RV Mid diam:    2.17 cm RV S prime:     14.70 cm/s TAPSE (M-mode): 1.8 cm  LEFT  ATRIUM             Index  RIGHT ATRIUM           Index LA diam:        2.91 cm 1.76 cm/m   RA Area:     11.10 cm LA Vol (A2C):   39.0 ml 23.62 ml/m  RA Volume:   21.10 ml  12.78 ml/m LA Vol (A4C):   38.4 ml 23.26 ml/m LA Biplane Vol: 40.0 ml 24.22 ml/m AORTIC VALVE AV Area (Vmax):    1.79 cm AV Area (Vmean):   1.73 cm AV Area (VTI):     1.64 cm AV Vmax:           142.00 cm/s AV Vmean:          97.200 cm/s AV VTI:            0.312 m AV Peak Grad:      8.1 mmHg AV Mean Grad:      4.0 mmHg LVOT Vmax:         102.00 cm/s LVOT Vmean:        67.400 cm/s LVOT VTI:          0.206 m LVOT/AV VTI ratio: 0.66 AI PHT:            368 msec AR Vena Contracta: 0.24 cm  AORTA Ao Root diam: 2.71 cm Ao Asc diam:  2.66 cm  MITRAL VALVE MV Area (PHT): 3.68 cm     SHUNTS MV Decel Time: 206 msec     Systemic VTI:  0.21 m MR Peak grad: 42.8 mmHg     Systemic Diam: 1.78 cm MR Mean grad: 19.0 mmHg MR Vmax:      327.00 cm/s MR Vmean:     196.0 cm/s MV E velocity: 82.70 cm/s MV A velocity: 118.00 cm/s MV E/A ratio:  0.70  Weston Brass MD Electronically signed by Weston Brass MD Signature Date/Time: 03/31/2023/1:14:07 PM    Final    MONITORS  LONG TERM MONITOR (3-14 DAYS) 03/25/2023  Narrative Patch Wear Time:  13 days and 17 hours (2024-12-09T16:47:13-0500 to 2024-12-23T10:41:46-0500)  Patient had a min HR of 57 bpm, max HR of 200 bpm, and avg HR of 81 bpm. Predominant underlying rhythm was Sinus Rhythm. Bundle Branch Block/IVCD was present. 2 Ventricular Tachycardia runs occurred, the run with the fastest interval lasting 5 beats with a max rate of 200 bpm, the longest lasting 4 beats with an avg rate of 141 bpm. 86 Supraventricular Tachycardia runs occurred, the run with the fastest interval lasting 8 beats with a max rate of 169 bpm, the longest lasting 27.8 secs with an avg rate of 110 bpm. Isolated SVEs were rare (<1.0%), SVE Couplets were rare (<1.0%), and SVE  Triplets were rare (<1.0%). Isolated VEs were frequent (25.8%, [id number]), VE Couplets were rare (<1.0%, 4350), and VE Triplets were rare (<1.0%, 11). Ventricular Bigeminy and Trigeminy were present.  SR/SB/ST Freq PVCs (25% burden) Occasional PACs Short runs of SVT/NSVT Needs ROV to discuss with me or an APP       ______________________________________________________________________________________________      Current Reported Medications:.    Current Meds  Medication Sig   albuterol (VENTOLIN HFA) 108 (90 Base) MCG/ACT inhaler Inhale 2 puffs into the lungs every 6 (six) hours as needed for wheezing or shortness of breath.   ALPRAZolam (XANAX) 0.25 MG tablet TAKE 1/2 TABLET BY MOUTH TWICE DAILY AS NEEDED FOR ANXIETY   amiodarone (PACERONE) 200 MG tablet Take 1 tablet by mouth TWICE daily for 14 days, then DECREASE  to 1 tablet ONCE daily   amLODipine (NORVASC) 5 MG tablet TAKE 1 TABLET BY MOUTH ONCE DAILY (Patient taking differently: Take 10 mg by mouth daily.)   azelastine (ASTELIN) 0.1 % nasal spray USE 1 SPRAY IN EACH NOSTRIL TWICE DAILY   cetirizine (ZYRTEC) 10 MG tablet Take 10 mg by mouth daily.    Cholecalciferol (VITAMIN D3) 5000 units TABS 5,000 IU OTC vitamin D3 daily.   Cyanocobalamin (VITAMIN B-12) 2500 MCG TABS Take 1,250 mcg by mouth daily.   docusate sodium (COLACE) 100 MG capsule Take 100 mg by mouth daily as needed.   escitalopram (LEXAPRO) 10 MG tablet Take 10 mg by mouth at bedtime.   fluticasone (FLONASE) 50 MCG/ACT nasal spray USE 2 SPRAYS INTO EACH NOSTRIL EVERY DAY   metoprolol succinate (TOPROL XL) 25 MG 24 hr tablet Take 1 tablet (25 mg total) by mouth daily.   mirabegron ER (MYRBETRIQ) 25 MG TB24 tablet 1 tablet Orally Once a day   montelukast (SINGULAIR) 10 MG tablet TAKE 1 TABLET BY MOUTH DAILY AT BEDTIME   nitrofurantoin, macrocrystal-monohydrate, (MACROBID) 100 MG capsule Take 1 capsule (100 mg total) by mouth 2 (two) times daily.   omeprazole (PRILOSEC)  20 MG capsule TAKE 1 CAPSULE BY MOUTH DAILY   sacubitril-valsartan (ENTRESTO) 24-26 MG Take 1 tablet by mouth 2 (two) times daily.   silver sulfADIAZINE (SILVADENE) 1 % cream Apply 1 Application topically daily.   SYMBICORT 80-4.5 MCG/ACT inhaler INHALE 2 PUFFS BY MOUTH INTO THE LUNGS TWICE A DAY   Vibegron (GEMTESA) 75 MG TABS Take 75 mg by mouth daily.   [DISCONTINUED] losartan (COZAAR) 100 MG tablet TAKE 1 TABLET BY MOUTH DAILY   Physical Exam:    VS:  BP 132/70 (BP Location: Left Arm, Patient Position: Sitting, Cuff Size: Normal)   Pulse (!) 57   Ht 5\' 2"  (1.575 m)   Wt 133 lb 12.8 oz (60.7 kg)   PF 96 L/min   BMI 24.47 kg/m    Wt Readings from Last 3 Encounters:  05/19/23 133 lb 12.8 oz (60.7 kg)  04/18/23 134 lb 12.8 oz (61.1 kg)  04/10/23 135 lb (61.2 kg)    GEN: Well nourished, well developed in no acute distress NECK: No JVD; No carotid bruits CARDIAC: RRR, no murmurs, rubs, gallops RESPIRATORY:  Clear to auscultation without rales, wheezing or rhonchi  ABDOMEN: Soft, non-tender, non-distended EXTREMITIES:  No edema; No acute deformity   Asessement and Plan:.    Frequent PVCs/LBBB: Cardiac monitor that she wore for 13 days and 17 hours indicated an average heart rate of 81 bpm, ranging from 57 to 200 bpm. Predominant underlying rhythm was sinus rhythm. She had 2 runs of ventricular tachycardia, 86 runs of supraventricular tachycardia she had very frequent episodes of PVCs with a 25.8% burden. She was started on metoprolol succinate 25 mg daily with referral to EP for frequent PVCs.  She was evaluated by Dr. Nelly Barr on 1/24 and started on amiodarone.  Today she reports that she is doing well, she denies any feelings of palpitations or fast heartbeats.  EKG today shows no PVCs.  Check CMET and TSH in two weeks. Continue amiodarone 200 mg daily and metoprolol succinate 25 mg daily.  HFrEF: Echo in 01/2018 with LVEF of 55 to 60%, akinesis of the mid anteroseptal myocardium, grade  1 DD.  Echo on 03/31/2023 indicated LVEF of 35 to 40%, with wall motion abnormalities, mild LVH, grade 1 DD. Today she reports ongoing fatigue, denies chest  pain, increased shortness of breath.  Patient notes that she does have some baseline shortness of breath that is unchanged and improved with Xanax, notes a significant component of anxiety.  Discussed ischemic evaluation such as nuclear stress test today with patient and daughter as well as potential for cardiac catheterization, patient notes that she is open to this however declined at this time.  Patient notes she would prefer to repeat an echocardiogram in 2 months, she agrees that if her EF is persistently low she would be agreeable to ischemic evaluation at that time.  Given patient's history with significant UTIs she is not a good candidate for SGLT2 inhibitors, patient declined spironolactone given diuretic property.  Patient appears euvolemic and well compensated on exam today, no indication for diuresis.  Patient agreeable to trialing Entresto.  Patient will stop losartan 100 mg daily and start Entresto 24-26 mg twice daily.  Patient encouraged to monitor her blood pressure at home, blood pressure log provided. Reviewed ED precautions. Check echocardiogram in two months. Check BMET today and CMET in two weeks.   Hyperlipidemia: Patient declines lipid-lowering therapy.  She has history of statin intolerance.  Hypertension: Blood pressure today 132/70.  Patient will stop losartan 100 mg daily and start Entresto 24-26 mg twice daily.  Encourage patient to obtain a blood pressure cuff and monitor her blood pressure for the next 2 weeks, ideally 2 hours after taking her morning medications and record on blood pressure log.  Patient to return blood pressure log at the end of 2 weeks.  Patient notify office if blood pressures consistently elevated above 140/90. Check BMET today and CMET in two weeks.    Disposition: F/u with Dr. Allyson Barr in 06/2023 as  scheduled.   Signed, Rip Harbour, NP

## 2023-05-19 ENCOUNTER — Ambulatory Visit: Payer: Medicare Other | Attending: Cardiology | Admitting: Cardiology

## 2023-05-19 ENCOUNTER — Encounter: Payer: Self-pay | Admitting: Cardiology

## 2023-05-19 VITALS — BP 132/70 | HR 57 | Ht 62.0 in | Wt 133.8 lb

## 2023-05-19 DIAGNOSIS — E782 Mixed hyperlipidemia: Secondary | ICD-10-CM | POA: Diagnosis not present

## 2023-05-19 DIAGNOSIS — I5020 Unspecified systolic (congestive) heart failure: Secondary | ICD-10-CM | POA: Diagnosis not present

## 2023-05-19 DIAGNOSIS — M9904 Segmental and somatic dysfunction of sacral region: Secondary | ICD-10-CM | POA: Diagnosis not present

## 2023-05-19 DIAGNOSIS — M9903 Segmental and somatic dysfunction of lumbar region: Secondary | ICD-10-CM | POA: Diagnosis not present

## 2023-05-19 DIAGNOSIS — I493 Ventricular premature depolarization: Secondary | ICD-10-CM | POA: Diagnosis not present

## 2023-05-19 DIAGNOSIS — I447 Left bundle-branch block, unspecified: Secondary | ICD-10-CM | POA: Insufficient documentation

## 2023-05-19 DIAGNOSIS — I10 Essential (primary) hypertension: Secondary | ICD-10-CM | POA: Insufficient documentation

## 2023-05-19 DIAGNOSIS — R0602 Shortness of breath: Secondary | ICD-10-CM | POA: Insufficient documentation

## 2023-05-19 DIAGNOSIS — R002 Palpitations: Secondary | ICD-10-CM | POA: Diagnosis not present

## 2023-05-19 DIAGNOSIS — M5136 Other intervertebral disc degeneration, lumbar region with discogenic back pain only: Secondary | ICD-10-CM | POA: Diagnosis not present

## 2023-05-19 DIAGNOSIS — M9905 Segmental and somatic dysfunction of pelvic region: Secondary | ICD-10-CM | POA: Diagnosis not present

## 2023-05-19 MED ORDER — SACUBITRIL-VALSARTAN 24-26 MG PO TABS: 1.0000 | ORAL_TABLET | Freq: Two times a day (BID) | ORAL | 3 refills | Status: DC

## 2023-05-19 NOTE — Patient Instructions (Addendum)
 Medication Instructions:  Stop [employer] Entresto 24-26 mg twice a day (Monitor at home how blood pressure at home notify office if blood pressure is consistently above 140/90.) *If you need a refill on your cardiac medications before your next appointment, please call your pharmacy*  Lab Work: Today we are going to draw a Bmet In 2 weeks we are going to need to draw Cmet and TSH If you have labs (blood work) drawn today and your tests are completely normal, you will receive your results only by: MyChart Message (if you have MyChart) OR A paper copy in the mail If you have any lab test that is abnormal or we need to change your treatment, we will call you to review the results.  Testing/Procedures: At least one week prior to you seeing Dr. Allyson Sabal we will need an echocardiogram performed Your physician has requested that you have an echocardiogram. Echocardiography is a painless test that uses sound waves to create images of your heart. It provides your doctor with information about the size and shape of your heart and how well your heart's chambers and valves are working. This procedure takes approximately one hour. There are no restrictions for this procedure. Please do NOT wear cologne, perfume, aftershave, or lotions (deodorant is allowed). Please arrive 15 minutes prior to your appointment time.  Please note: We ask at that you not bring children with you during ultrasound (echo/ vascular) testing. Due to room size and safety concerns, children are not allowed in the ultrasound rooms during exams. Our front office staff cannot provide observation of children in our lobby area while testing is being conducted. An adult accompanying a patient to their appointment will only be allowed in the ultrasound room at the discretion of the ultrasound technician under special circumstances. We apologize for any inconvenience.  Follow-Up: At [facility], you and your health needs are our  priority.  As part of our continuing mission to provide you with exceptional heart care, we have created designated Provider Care Teams.  These Care Teams include your primary Cardiologist (physician) and Advanced Practice Providers (APPs -  Physician Assistants and Nurse Practitioners) who all work together to provide you with the care you need, when you need it.  We recommend signing up for the patient portal called "MyChart".  Sign up information is provided on this After Visit Summary.  MyChart is used to connect with patients for Virtual Visits (Telemedicine).  Patients are able to view lab/test results, encounter notes, upcoming appointments, etc.  Non-urgent messages can be sent to your provider as well.   To learn more about what you can do with MyChart, go to [email or web address].    Your next appointment:   Already scheduled  Other Instructions Please take your blood pressure daily for 2 weeks. Please include heart rates. (One message at the end of the 2 weeks).   HOW TO TAKE YOUR BLOOD PRESSURE: Rest 5 minutes before taking your blood pressure. Don't smoke or drink caffeinated beverages for at least 30 minutes before. Take your blood pressure before (not after) you eat. Sit comfortably with your back supported and both feet on the floor (don't cross your legs). Elevate your arm to heart level on a table or a desk. Use the proper sized cuff. It should fit smoothly and snugly around your bare upper arm. There should be enough room to slip a fingertip under the cuff. The bottom edge of the cuff should be 1 inch above the crease of  the elbow. Ideally, take 3 measurements at one sitting and record the average.

## 2023-05-20 DIAGNOSIS — M6281 Muscle weakness (generalized): Secondary | ICD-10-CM | POA: Diagnosis not present

## 2023-05-20 DIAGNOSIS — M545 Low back pain, unspecified: Secondary | ICD-10-CM | POA: Diagnosis not present

## 2023-05-20 DIAGNOSIS — G8929 Other chronic pain: Secondary | ICD-10-CM | POA: Diagnosis not present

## 2023-05-20 DIAGNOSIS — M25511 Pain in right shoulder: Secondary | ICD-10-CM | POA: Diagnosis not present

## 2023-05-20 LAB — BASIC METABOLIC PANEL
BUN/Creatinine Ratio: 16 (ref 12–28)
BUN: 12 mg/dL (ref 8–27)
CO2: 24 mmol/L (ref 20–29)
Calcium: 9.8 mg/dL (ref 8.7–10.3)
Chloride: 96 mmol/L (ref 96–106)
Creatinine, Ser: 0.73 mg/dL (ref 0.57–1.00)
Glucose: 91 mg/dL (ref 70–99)
Potassium: 5.1 mmol/L (ref 3.5–5.2)
Sodium: 135 mmol/L (ref 134–144)
eGFR: 79 mL/min/{1.73_m2} (ref >59)

## 2023-05-21 DIAGNOSIS — G729 Myopathy, unspecified: Secondary | ICD-10-CM | POA: Diagnosis not present

## 2023-05-21 DIAGNOSIS — I493 Ventricular premature depolarization: Secondary | ICD-10-CM | POA: Diagnosis not present

## 2023-05-21 DIAGNOSIS — F339 Major depressive disorder, recurrent, unspecified: Secondary | ICD-10-CM | POA: Diagnosis not present

## 2023-05-21 DIAGNOSIS — N182 Chronic kidney disease, stage 2 (mild): Secondary | ICD-10-CM | POA: Diagnosis not present

## 2023-05-21 DIAGNOSIS — R7303 Prediabetes: Secondary | ICD-10-CM | POA: Diagnosis not present

## 2023-05-21 DIAGNOSIS — I700 Atherosclerosis of aorta: Secondary | ICD-10-CM | POA: Diagnosis not present

## 2023-05-21 DIAGNOSIS — Z87891 Personal history of nicotine dependence: Secondary | ICD-10-CM | POA: Diagnosis not present

## 2023-05-21 DIAGNOSIS — I5020 Unspecified systolic (congestive) heart failure: Secondary | ICD-10-CM | POA: Diagnosis not present

## 2023-05-21 DIAGNOSIS — I471 Supraventricular tachycardia, unspecified: Secondary | ICD-10-CM | POA: Diagnosis not present

## 2023-05-21 DIAGNOSIS — I130 Hypertensive heart and chronic kidney disease with heart failure and stage 1 through stage 4 chronic kidney disease, or unspecified chronic kidney disease: Secondary | ICD-10-CM | POA: Diagnosis not present

## 2023-05-21 DIAGNOSIS — F419 Anxiety disorder, unspecified: Secondary | ICD-10-CM | POA: Diagnosis not present

## 2023-05-21 DIAGNOSIS — E785 Hyperlipidemia, unspecified: Secondary | ICD-10-CM | POA: Diagnosis not present

## 2023-05-22 DIAGNOSIS — M545 Low back pain, unspecified: Secondary | ICD-10-CM | POA: Diagnosis not present

## 2023-05-22 DIAGNOSIS — M25511 Pain in right shoulder: Secondary | ICD-10-CM | POA: Diagnosis not present

## 2023-05-22 DIAGNOSIS — M6281 Muscle weakness (generalized): Secondary | ICD-10-CM | POA: Diagnosis not present

## 2023-05-22 DIAGNOSIS — G8929 Other chronic pain: Secondary | ICD-10-CM | POA: Diagnosis not present

## 2023-05-26 DIAGNOSIS — G8929 Other chronic pain: Secondary | ICD-10-CM | POA: Diagnosis not present

## 2023-05-26 DIAGNOSIS — M545 Low back pain, unspecified: Secondary | ICD-10-CM | POA: Diagnosis not present

## 2023-05-26 DIAGNOSIS — M6281 Muscle weakness (generalized): Secondary | ICD-10-CM | POA: Diagnosis not present

## 2023-05-26 DIAGNOSIS — M25511 Pain in right shoulder: Secondary | ICD-10-CM | POA: Diagnosis not present

## 2023-05-27 ENCOUNTER — Telehealth: Payer: Self-pay

## 2023-05-27 DIAGNOSIS — E875 Hyperkalemia: Secondary | ICD-10-CM

## 2023-05-27 DIAGNOSIS — I493 Ventricular premature depolarization: Secondary | ICD-10-CM

## 2023-05-27 NOTE — Telephone Encounter (Signed)
-----   Message from Rip Harbour sent at 05/20/2023  6:59 PM EST ----- Please let [name] know that her kidney function is normal, her potassium is in the higher range of normal.  Would recommend that she avoid any potassium supplementation or intake of electrolyte beverages such as IV liquid or Gatorade.  She should also monitor intake of potassium rich foods such as bananas, squash, yogurt, white beans, sweet potatoes, leafy greens, and avocados. She will have repeat BMET in two weeks, will continue monitoring.

## 2023-05-27 NOTE — Telephone Encounter (Signed)
 Called patient advised of below they verbalized understanding Ordered labs and sent th her address listed

## 2023-05-28 DIAGNOSIS — M25511 Pain in right shoulder: Secondary | ICD-10-CM | POA: Diagnosis not present

## 2023-05-28 DIAGNOSIS — M545 Low back pain, unspecified: Secondary | ICD-10-CM | POA: Diagnosis not present

## 2023-05-28 DIAGNOSIS — G8929 Other chronic pain: Secondary | ICD-10-CM | POA: Diagnosis not present

## 2023-05-28 DIAGNOSIS — M6281 Muscle weakness (generalized): Secondary | ICD-10-CM | POA: Diagnosis not present

## 2023-05-30 DIAGNOSIS — R8271 Bacteriuria: Secondary | ICD-10-CM | POA: Diagnosis not present

## 2023-05-30 DIAGNOSIS — N3020 Other chronic cystitis without hematuria: Secondary | ICD-10-CM | POA: Diagnosis not present

## 2023-06-02 DIAGNOSIS — R0602 Shortness of breath: Secondary | ICD-10-CM | POA: Diagnosis not present

## 2023-06-02 DIAGNOSIS — R002 Palpitations: Secondary | ICD-10-CM | POA: Diagnosis not present

## 2023-06-02 DIAGNOSIS — I493 Ventricular premature depolarization: Secondary | ICD-10-CM | POA: Diagnosis not present

## 2023-06-02 DIAGNOSIS — I447 Left bundle-branch block, unspecified: Secondary | ICD-10-CM | POA: Diagnosis not present

## 2023-06-02 DIAGNOSIS — G8929 Other chronic pain: Secondary | ICD-10-CM | POA: Diagnosis not present

## 2023-06-02 DIAGNOSIS — I5020 Unspecified systolic (congestive) heart failure: Secondary | ICD-10-CM | POA: Diagnosis not present

## 2023-06-02 DIAGNOSIS — E782 Mixed hyperlipidemia: Secondary | ICD-10-CM | POA: Diagnosis not present

## 2023-06-02 DIAGNOSIS — M6281 Muscle weakness (generalized): Secondary | ICD-10-CM | POA: Diagnosis not present

## 2023-06-02 DIAGNOSIS — M25511 Pain in right shoulder: Secondary | ICD-10-CM | POA: Diagnosis not present

## 2023-06-02 DIAGNOSIS — M545 Low back pain, unspecified: Secondary | ICD-10-CM | POA: Diagnosis not present

## 2023-06-02 DIAGNOSIS — I10 Essential (primary) hypertension: Secondary | ICD-10-CM | POA: Diagnosis not present

## 2023-06-02 LAB — COMPREHENSIVE METABOLIC PANEL
ALT: 20 IU/L (ref 0–32)
AST: 18 IU/L (ref 0–40)
Albumin: 4.7 g/dL (ref 3.7–4.7)
Alkaline Phosphatase: 55 IU/L (ref 44–121)
BUN/Creatinine Ratio: 22 (ref 12–28)
BUN: 22 mg/dL (ref 8–27)
Bilirubin Total: 0.3 mg/dL (ref 0.0–1.2)
CO2: 24 mmol/L (ref 20–29)
Calcium: 9.3 mg/dL (ref 8.7–10.3)
Chloride: 95 mmol/L — ABNORMAL LOW (ref 96–106)
Creatinine, Ser: 0.98 mg/dL (ref 0.57–1.00)
Globulin, Total: 1.7 g/dL (ref 1.5–4.5)
Glucose: 90 mg/dL (ref 70–99)
Potassium: 5 mmol/L (ref 3.5–5.2)
Sodium: 134 mmol/L (ref 134–144)
Total Protein: 6.4 g/dL (ref 6.0–8.5)
eGFR: 56 mL/min/{1.73_m2} — ABNORMAL LOW (ref >59)

## 2023-06-02 LAB — TSH: TSH: 4.05 u[IU]/mL (ref 0.450–4.500)

## 2023-06-04 ENCOUNTER — Telehealth: Payer: Self-pay

## 2023-06-04 ENCOUNTER — Telehealth: Payer: Self-pay | Admitting: Cardiovascular Disease

## 2023-06-04 NOTE — Telephone Encounter (Signed)
   Pt c/o of Chest Pain: STAT if active CP, including tightness, pressure, jaw pain, radiating pain to shoulder/upper arm/back, CP unrelieved by Nitro. Symptoms reported of SOB, nausea, vomiting, sweating.  1. Are you having CP right now? No, is not pain     2. Are you experiencing any other symptoms (ex. SOB, nausea, vomiting, sweating)? Extreme fatigue, waking up in AM very hungry, thirsty, and sweating from the chest up, and increased SOB   3. Is your CP continuous or coming and going? N/A   4. Have you taken Nitroglycerin? N/A   5. How long have you been experiencing CP? Did not say    6. If NO CP at time of call then end call with telling Pt to call back or call 911 if Chest pain returns prior to return call from triage team.   Ma Rings a RN with [facility] is calling to reports the pt was seen today for fullness in chest, fullness in head, extreme fatigue, waking up in the morning extremely hungry, thirsty, and sweaty from the chest up. The patient started entresto on 02/24. The sweating began a week ago. Tiredness and head feeling full started since starting medication, progressively getting worse. RN reports she checked her blood sugar that read 107. Vitals were normal for her, no fever. Oxygen did not drop with exertion. Another symptom was feeling a little more SOB.   Requesting pt be seen tomorrow regarding this. She would like the pt to be called back about this. Please advise.

## 2023-06-04 NOTE — Telephone Encounter (Signed)
 Attempted to return call to [facility], no answer left message requesting call back.

## 2023-06-04 NOTE — Telephone Encounter (Signed)
-----   Message from Rip Harbour sent at 06/04/2023 10:55 AM EDT ----- Please let [name] know that her kidney function is normal and her potassium is high normal. Her liver function and thyroid function are normal. Good results! Continue current medications and follow up as planned.

## 2023-06-04 NOTE — Telephone Encounter (Signed)
 Called patient on the results of lab work Discussed with patient their symptoms, Patient report that the symptoms over a month ago when starting the [place]. Patient reports that she is feeling more fatigue than normal. Patient is noting excess sweating first thing in the morning when first getting up. She also reports that she felt that her

## 2023-06-05 NOTE — Progress Notes (Signed)
 Cardiology Office Note    Date:  06/06/2023  ID:  TAMIAH DYSART, DOB [birth year], MRN [id number] PCP:  Melida Quitter, MD  Cardiologist:  Nanetta Batty, MD  Electrophysiologist:  Maurice Small, MD   Chief Complaint: Fatigue   History of Present Illness: [name] is a 89 y.o. female with visit-pertinent history of hyperlipidemia, hypertension, type 2 diabetes mellitus, HFmrEF and frequent PVCs.    Patient had ABIs in 04/2017 that were within normal range.  In 01/2018 she presented with atypical chest pain and asthma type symptoms.  She did Myoview stress test that was low risk and nonischemic and echo revealed normal LV function with anteroapical wall motion abnormality.  She was last seen in clinic by Dr. Allyson Sabal on 12/10/2022, it was noted that she has complaints of chronic fatigue, her EKG showed bigeminal PVCs, at that time she did not wish to have further workup.  Patient presented on 02/25/2023, she endorsed intermittent palpitations described as skipped beat.  She denied any associated symptoms.  She did report some ongoing fatigue although felt that it was related more so to not sleeping well at night.  She denied any chest pain or new or worsening shortness of breath.  Patient cardiac monitor that she wore for 13 days and 17 hours indicated an average heart rate of 81 bpm, ranging from 57 to 200 bpm.  Predominant underlying rhythm was sinus rhythm.  She had 2 runs of ventricular tachycardia, 86 runs of supraventricular tachycardia she had very frequent episodes of PVCs with a 25.8% burden.  She was started on metoprolol succinate 25 mg daily with referral to EP for frequent PVCs.  Her echocardiogram on 03/31/2023 indicated LVEF of 35 to 40%, she did have wall motion abnormalities, mild LVH, grade 1 diastolic dysfunction, she had mildly dilated LA, mild to moderate mitral valve regurgitation with no evidence of stenosis, there was aortic valve sclerosis present with no evidence  of aortic valve stenosis.   Patient was last seen in office on 04/10/2023.  She reported that she was doing well, noted ongoing fatigue.  It was noted by her daughter that she did have a bladder infection that have been ongoing for a few weeks.  She denies chest pain, notes some stable shortness of breath.  She denies lower extremity edema.  She was seen in clinic on 04/14/2023 by Dr. Nelly Laurence.  She reported that her palpitations had improved after starting on metoprolol.  She reported ongoing fatigue and shortness of breath.  Dr. Hyacinth Meeker started her on amiodarone 200 mg twice daily for 2 weeks then 200 mg daily.  Patient was last seen in clinic on 05/15/2023 for follow-up.  She reported that she was doing well, denied chest pain, palpitations, new shortness of breath, lower extremity edema, orthopnea or PND.  She reported that she had been tolerating metoprolol and amiodarone well.  Patient was transition from losartan to [facility].  Follow-up lab work was unremarkable.  On 06/04/2023 patient notified the office of excess fatigue and worsening shortness of breath since starting on Entresto.    Today she presents regarding excess fatigue, she presents with her daughter.  Patient reports that about 1 month ago she started having less energy, not feeling like getting out of bed in the morning.  She was seen last week by her nurse practitioner at [facility], recommended follow-up with cardiology.  Patient continues to walk daily and is going to physical therapy to keep  active, notes she tolerates this well.  She denies chest pain, notes that her shortness of breath is overall stable.  She denies any lower extremity edema, orthopnea or PND.  Denies any palpitations.  Questions of increased fatigue and overall feeling "crummy" is related to starting on Entresto.  She notes that after starting Entresto she had some increased shortness of breath that is since resolved.  Patient has been monitoring her blood pressure at home,  systolics typically in the 130s to 140s.  Patient does note she continues to have frequent UTIs and bladder problems, notes that not feeling well could be related to this as well.  She also endorses some increased anxiety and depression, her daughter notes that she typically does have this in the winter months however has not started improving like it typically does.  She was recently started on buspirone, no significant changes yet.  Labwork independently reviewed: 06/02/2023: Sodium 134, potassium 5.0, creatinine 0.98, AST 18, ALT 20 ROS: .   Today she denies chest pain, lower extremity edema, palpitations, melena, hematuria, hemoptysis, diaphoresis, weakness, presyncope, syncope, orthopnea, and PND.  All other systems are reviewed and otherwise negative. Studies Reviewed: Marland Kitchen   EKG:  EKG is ordered today, personally reviewed, demonstrating  EKG Interpretation Date/Time:  Friday June 06 2023 09:16:47 EDT Ventricular Rate:  60 PR Interval:  154 QRS Duration:  150 QT Interval:  498 QTC Calculation: 498 R Axis:   -36  Text Interpretation: Normal sinus rhythm Left axis deviation Left bundle branch block When compared with ECG of 19-May-2023 11:02, Premature ventricular complexes are no longer Present Confirmed by Reather Littler [phone]) on 06/06/2023 12:57:34 PM   CV Studies: Cardiac studies reviewed are outlined and summarized above. Otherwise please see EMR for full report. Cardiac Studies & Procedures   ______________________________________________________________________________________________   STRESS TESTS  MYOCARDIAL PERFUSION IMAGING 02/11/2018  Narrative  Nuclear stress EF: 56%.  There was no ST segment deviation noted during stress.  This is a low risk study.  The left ventricular ejection fraction is normal (55-65%).  1. EF 56%, normal wall motion. 2. Fixed small, mild apical perfusion defect.  Given normal wall motion, suspect attenuation artifact.  No ischemia.  Low  risk study.   ECHOCARDIOGRAM  ECHOCARDIOGRAM COMPLETE 03/31/2023  Narrative ECHOCARDIOGRAM REPORT    Patient Name:   KALIYAH GLADMAN Date of Exam: 03/31/2023 Medical Rec #:  [id number]        Height:       64.0 in Accession #:    [id number]       Weight:       134.2 lb Date of Birth:  04-24-1933         BSA:          1.651 m Patient Age:    89 years         BP:           124/67 mmHg Patient Gender: F                HR:           69 bpm. Exam Location:  Outpatient  Procedure: 2D Echo, 3D Echo, Cardiac Doppler, Color Doppler and Strain Analysis  Indications:    Shortness of breath [R06.02 (ICD-10-CM)]  History:        Patient has prior history of Echocardiogram examinations, most recent 02/11/2018. Risk Factors:Hypertension and Dyslipidemia.  Sonographer:    Gertie Fey MHA, RDMS, RVT, RDCS Referring Phys: [phone] [facility] D Tylerjames Hoglund  Sonographer Comments: Restricted mobility. IMPRESSIONS   1. Left ventricular ejection fraction, by estimation, is 35 to 40%. Left ventricular ejection fraction by 3D volume is 41 %. The left ventricle has moderately decreased function. The left ventricle demonstrates regional wall motion abnormalities (see scoring diagram/findings for description). There is mild left ventricular hypertrophy. Left ventricular diastolic parameters are consistent with Grade I diastolic dysfunction (impaired relaxation). 2. Right ventricular systolic function is normal. The right ventricular size is normal. Tricuspid regurgitation signal is inadequate for assessing PA pressure. 3. Mildly reduced LA reservoir strain 27%. Left atrial size was mildly dilated. 4. The mitral valve is degenerative. Mild to moderate mitral valve regurgitation. No evidence of mitral stenosis. 5. The aortic valve is grossly normal. There is mild thickening of the aortic valve. Aortic valve regurgitation is trivial. Aortic valve sclerosis is present, with no evidence of aortic valve stenosis. 6.  The inferior vena cava is dilated in size with >50% respiratory variability, suggesting right atrial pressure of 8 mmHg.  FINDINGS Left Ventricle: Left ventricular ejection fraction, by estimation, is 35 to 40%. Left ventricular ejection fraction by 3D volume is 41 %. The left ventricle has moderately decreased function. The left ventricle demonstrates regional wall motion abnormalities. The left ventricular internal cavity size was normal in size. There is mild left ventricular hypertrophy. Abnormal (paradoxical) septal motion, consistent with left bundle branch block. Left ventricular diastolic parameters are consistent with Grade I diastolic dysfunction (impaired relaxation).   LV Wall Scoring: The entire septum and apical inferior segment are hypokinetic.  Right Ventricle: The right ventricular size is normal. No increase in right ventricular wall thickness. Right ventricular systolic function is normal. Tricuspid regurgitation signal is inadequate for assessing PA pressure.  Left Atrium: Mildly reduced LA reservoir strain 27%. Left atrial size was mildly dilated.  Right Atrium: Right atrial size was normal in size.  Pericardium: There is no evidence of pericardial effusion.  Mitral Valve: The mitral valve is degenerative in appearance. There is moderate calcification of the mitral valve leaflet(s). Mild to moderate mitral valve regurgitation. No evidence of mitral valve stenosis.  Tricuspid Valve: The tricuspid valve is normal in structure. Tricuspid valve regurgitation is trivial. No evidence of tricuspid stenosis.  Aortic Valve: The aortic valve is grossly normal. There is mild thickening of the aortic valve. Aortic valve regurgitation is trivial. Aortic regurgitation PHT measures 368 msec. Aortic valve sclerosis is present, with no evidence of aortic valve stenosis. Aortic valve mean gradient measures 4.0 mmHg. Aortic valve peak gradient measures 8.1 mmHg. Aortic valve area, by VTI  measures 1.64 cm.  Pulmonic Valve: The pulmonic valve was normal in structure. Pulmonic valve regurgitation is trivial. No evidence of pulmonic stenosis.  Aorta: The aortic root is normal in size and structure.  Venous: The inferior vena cava is dilated in size with greater than 50% respiratory variability, suggesting right atrial pressure of 8 mmHg.  IAS/Shunts: No atrial level shunt detected by color flow Doppler.   LEFT VENTRICLE PLAX 2D LVIDd:         4.60 cm         Diastology LVIDs:         3.70 cm         LV e' medial:    4.79 cm/s LV PW:         1.03 cm         LV E/e' medial:  17.3 LV IVS:        0.89 cm  LV e' lateral:   6.42 cm/s LVOT diam:     1.78 cm         LV E/e' lateral: 12.9 LV SV:         51 LV SV Index:   31 LVOT Area:     2.49 cm        3D Volume EF LV 3D EF:    Left ventricul ar ejection fraction by 3D volume is 41 %.  3D Volume EF: 3D EF:        41 % LV EDV:       150 ml LV ESV:       88 ml LV SV:        62 ml  RIGHT VENTRICLE RV Basal diam:  2.73 cm RV Mid diam:    2.17 cm RV S prime:     14.70 cm/s TAPSE (M-mode): 1.8 cm  LEFT ATRIUM             Index        RIGHT ATRIUM           Index LA diam:        2.91 cm 1.76 cm/m   RA Area:     11.10 cm LA Vol (A2C):   39.0 ml 23.62 ml/m  RA Volume:   21.10 ml  12.78 ml/m LA Vol (A4C):   38.4 ml 23.26 ml/m LA Biplane Vol: 40.0 ml 24.22 ml/m AORTIC VALVE AV Area (Vmax):    1.79 cm AV Area (Vmean):   1.73 cm AV Area (VTI):     1.64 cm AV Vmax:           142.00 cm/s AV Vmean:          97.200 cm/s AV VTI:            0.312 m AV Peak Grad:      8.1 mmHg AV Mean Grad:      4.0 mmHg LVOT Vmax:         102.00 cm/s LVOT Vmean:        67.400 cm/s LVOT VTI:          0.206 m LVOT/AV VTI ratio: 0.66 AI PHT:            368 msec AR Vena Contracta: 0.24 cm  AORTA Ao Root diam: 2.71 cm Ao Asc diam:  2.66 cm  MITRAL VALVE MV Area (PHT): 3.68 cm     SHUNTS MV Decel Time: 206 msec      Systemic VTI:  0.21 m MR Peak grad: 42.8 mmHg     Systemic Diam: 1.78 cm MR Mean grad: 19.0 mmHg MR Vmax:      327.00 cm/s MR Vmean:     196.0 cm/s MV E velocity: 82.70 cm/s MV A velocity: 118.00 cm/s MV E/A ratio:  0.70  Weston Brass MD Electronically signed by Weston Brass MD Signature Date/Time: 03/31/2023/1:14:07 PM    Final    MONITORS  LONG TERM MONITOR (3-14 DAYS) 03/25/2023  Narrative Patch Wear Time:  13 days and 17 hours (2024-12-09T16:47:13-0500 to 2024-12-23T10:41:46-0500)  Patient had a min HR of 57 bpm, max HR of 200 bpm, and avg HR of 81 bpm. Predominant underlying rhythm was Sinus Rhythm. Bundle Branch Block/IVCD was present. 2 Ventricular Tachycardia runs occurred, the run with the fastest interval lasting 5 beats with a max rate of 200 bpm, the longest lasting 4 beats with an avg rate of 141 bpm. 86 Supraventricular Tachycardia runs occurred,  the run with the fastest interval lasting 8 beats with a max rate of 169 bpm, the longest lasting 27.8 secs with an avg rate of 110 bpm. Isolated SVEs were rare (<1.0%), SVE Couplets were rare (<1.0%), and SVE Triplets were rare (<1.0%). Isolated VEs were frequent (25.8%, [id number]), VE Couplets were rare (<1.0%, 4350), and VE Triplets were rare (<1.0%, 11). Ventricular Bigeminy and Trigeminy were present.  SR/SB/ST Freq PVCs (25% burden) Occasional PACs Short runs of SVT/NSVT Needs ROV to discuss with me or an APP       ______________________________________________________________________________________________       Current Reported Medications:.    Current Meds  Medication Sig   acetaminophen (TYLENOL) 500 MG tablet Take by mouth as needed.   albuterol (VENTOLIN HFA) 108 (90 Base) MCG/ACT inhaler Inhale 2 puffs into the lungs every 6 (six) hours as needed for wheezing or shortness of breath.   ALPRAZolam (XANAX) 0.25 MG tablet TAKE 1/2 TABLET BY MOUTH TWICE DAILY AS NEEDED FOR ANXIETY   amiodarone  (PACERONE) 200 MG tablet Take 1 tablet by mouth TWICE daily for 14 days, then DECREASE to 1 tablet ONCE daily   amLODipine (NORVASC) 10 MG tablet Take 10 mg by mouth daily.   azelastine (ASTELIN) 0.1 % nasal spray USE 1 SPRAY IN EACH NOSTRIL TWICE DAILY   busPIRone (BUSPAR) 7.5 MG tablet Take 7.5 mg by mouth 2 (two) times daily.   cetirizine (ZYRTEC) 10 MG tablet Take 10 mg by mouth daily.    Cholecalciferol (VITAMIN D3) 5000 units TABS 5,000 IU OTC vitamin D3 daily.   ciprofloxacin (CIPRO) 500 MG tablet Take 500 mg by mouth 2 (two) times daily.   Cyanocobalamin (VITAMIN B-12) 2500 MCG TABS Take 1,250 mcg by mouth daily.   docusate sodium (COLACE) 100 MG capsule Take 100 mg by mouth daily as needed.   DULoxetine (CYMBALTA) 30 MG capsule Take 1 capsule (30 mg total) by mouth daily.   losartan (COZAAR) 100 MG tablet Take 1 tablet (100 mg total) by mouth daily.   metoprolol succinate (TOPROL XL) 25 MG 24 hr tablet Take 1 tablet (25 mg total) by mouth daily.   mirabegron ER (MYRBETRIQ) 25 MG TB24 tablet 1 tablet Orally Once a day   montelukast (SINGULAIR) 10 MG tablet TAKE 1 TABLET BY MOUTH DAILY AT BEDTIME   nitrofurantoin, macrocrystal-monohydrate, (MACROBID) 100 MG capsule Take 1 capsule (100 mg total) by mouth 2 (two) times daily.   omeprazole (PRILOSEC) 20 MG capsule TAKE 1 CAPSULE BY MOUTH DAILY   silver sulfADIAZINE (SILVADENE) 1 % cream Apply 1 Application topically daily.   SYMBICORT 80-4.5 MCG/ACT inhaler INHALE 2 PUFFS BY MOUTH INTO THE LUNGS TWICE A DAY   Vibegron (GEMTESA) 75 MG TABS Take 75 mg by mouth daily.   [DISCONTINUED] amLODipine (NORVASC) 5 MG tablet TAKE 1 TABLET BY MOUTH ONCE DAILY (Patient taking differently: Take 10 mg by mouth daily.)   [DISCONTINUED] sacubitril-valsartan (ENTRESTO) 24-26 MG Take 1 tablet by mouth 2 (two) times daily.    Physical Exam:    VS:  BP 132/78   Pulse 62   Ht 5\' 2"  (1.575 m)   Wt 132 lb (59.9 kg)   SpO2 95%   BMI 24.14 kg/m    Wt  Readings from Last 3 Encounters:  06/06/23 132 lb (59.9 kg)  05/19/23 133 lb 12.8 oz (60.7 kg)  04/18/23 134 lb 12.8 oz (61.1 kg)    GEN: Well nourished, well developed in no acute distress NECK: No JVD;  No carotid bruits CARDIAC: RRR, no murmurs, rubs, gallops RESPIRATORY:  Clear to auscultation without rales, wheezing or rhonchi  ABDOMEN: Soft, non-tender, non-distended EXTREMITIES:  No edema; No acute deformity     Asessement and Plan:.    HFrEF: Echo in 01/2018 with LVEF of 55 to 60%, akinesis of the mid anteroseptal myocardium, grade 1 DD.  Echo on 03/31/2023 indicated LVEF of 35 to 40%, with wall motion abnormalities, mild LVH, grade 1 DD. Today she reports ongoing fatigue, denies chest pain, increased shortness of breath.  Patient notes that she does have some baseline shortness of breath that is unchanged and improved with Xanax, notes a significant component of anxiety.  Patient previously transition to [facility], today she reports that she has been increasingly fatigued and has not felt well since starting on Entresto.  Notes this could also be related to frequent UTIs and mental health however timeline does correlate with starting of Entresto.  Patient denies chest pain, lower extreme edema, orthopnea or PND.  Notes after starting Entresto she had some increased shortness of breath that resolved.  Through shared decision making elected to stop Entresto and restart her losartan.  Encouraged patient to continue monitoring her blood pressures at home and notify the office if consistently elevated above 130/80.  She will notify the office of weight gain of 2 to 3 pounds overnight or 5 pounds in a week, increased lower extremity edema.  Will continue with plan for repeat echocardiogram next month, if no improvement she is agreeable to ischemic evaluation.  Continue amiodarone 200 mg daily, metoprolol succinate 25 mg daily, losartan 100 mg daily.  Patient not a candidate for SGLT2 inhibitor given  frequent UTIs.  She has declined spironolactone.  Frequent PVCs/LBBB: Cardiac monitor previously worn for 13 days and 17 hours indicated an average heart rate of 81 bpm, ranging from 57 to 200 bpm.  Predominant underlying rhythm was sinus rhythm.  She had 2 runs of ventricular tachycardia, 86 runs of supraventricular tachycardia, she had very frequent runs of PVCs with a 25.8% burden.  She has previously been started on metoprolol succinate and amiodarone.  Today she denies palpitations or feelings of increased heart rates.  EKG today shows no PVCs.  AST 18, ALT 20 and TSH 4.05 on 06/02/2023.  Hyperlipidemia: Patient has declined lipid-lowering therapy, she has history of statin intolerance.  Hypertension: Blood pressure today 132/78.  Will stop Entresto as noted below and resume patient's original dose of losartan.  Patient will monitor her blood pressure at home and notify the office if consistently elevated above 130/80.  Check BMET in 2 weeks.    Disposition: F/u with Dr. Nelly Laurence and Dr. Allyson Sabal as scheduled in 06/2023.   Signed, Rip Harbour, NP

## 2023-06-06 ENCOUNTER — Ambulatory Visit: Attending: Cardiology | Admitting: Cardiology

## 2023-06-06 ENCOUNTER — Encounter: Payer: Self-pay | Admitting: Cardiology

## 2023-06-06 VITALS — BP 132/78 | HR 62 | Ht 62.0 in | Wt 132.0 lb

## 2023-06-06 DIAGNOSIS — I5020 Unspecified systolic (congestive) heart failure: Secondary | ICD-10-CM | POA: Insufficient documentation

## 2023-06-06 DIAGNOSIS — I10 Essential (primary) hypertension: Secondary | ICD-10-CM | POA: Insufficient documentation

## 2023-06-06 DIAGNOSIS — E782 Mixed hyperlipidemia: Secondary | ICD-10-CM | POA: Diagnosis not present

## 2023-06-06 DIAGNOSIS — R0602 Shortness of breath: Secondary | ICD-10-CM | POA: Diagnosis not present

## 2023-06-06 DIAGNOSIS — I493 Ventricular premature depolarization: Secondary | ICD-10-CM | POA: Diagnosis present

## 2023-06-06 DIAGNOSIS — R002 Palpitations: Secondary | ICD-10-CM | POA: Diagnosis present

## 2023-06-06 MED ORDER — LOSARTAN POTASSIUM 100 MG PO TABS
100.0000 mg | ORAL_TABLET | Freq: Every day | ORAL | 3 refills | Status: AC
Start: 2023-06-06 — End: 2023-09-04

## 2023-06-06 NOTE — Patient Instructions (Signed)
 Medication Instructions:  Stop Entresto Start Losartan 100 mg once a day  *If you need a refill on your cardiac medications before your next appointment, please call your pharmacy*  Lab Work: In 3 weeks we are going to draw a BMet If you have labs (blood work) drawn today and your tests are completely normal, you will receive your results only by: MyChart Message (if you have MyChart) OR A paper copy in the mail If you have any lab test that is abnormal or we need to change your treatment, we will call you to review the results.  Testing/Procedures: No testing  Follow-Up: At [facility], you and your health needs are our priority.  As part of our continuing mission to provide you with exceptional heart care, we have created designated Provider Care Teams.  These Care Teams include your primary Cardiologist (physician) and Advanced Practice Providers (APPs -  Physician Assistants and Nurse Practitioners) who all work together to provide you with the care you need, when you need it.  We recommend signing up for the patient portal called "MyChart".  Sign up information is provided on this After Visit Summary.  MyChart is used to connect with patients for Virtual Visits (Telemedicine).  Patients are able to view lab/test results, encounter notes, upcoming appointments, etc.  Non-urgent messages can be sent to your provider as well.   To learn more about what you can do with MyChart, go to [email or web address].    Your next appointment:   Already scheduled

## 2023-06-07 ENCOUNTER — Encounter: Payer: Self-pay | Admitting: Cardiology

## 2023-06-09 DIAGNOSIS — M25511 Pain in right shoulder: Secondary | ICD-10-CM | POA: Diagnosis not present

## 2023-06-09 DIAGNOSIS — G8929 Other chronic pain: Secondary | ICD-10-CM | POA: Diagnosis not present

## 2023-06-09 DIAGNOSIS — M545 Low back pain, unspecified: Secondary | ICD-10-CM | POA: Diagnosis not present

## 2023-06-09 DIAGNOSIS — M6281 Muscle weakness (generalized): Secondary | ICD-10-CM | POA: Diagnosis not present

## 2023-06-11 DIAGNOSIS — M545 Low back pain, unspecified: Secondary | ICD-10-CM | POA: Diagnosis not present

## 2023-06-11 DIAGNOSIS — G8929 Other chronic pain: Secondary | ICD-10-CM | POA: Diagnosis not present

## 2023-06-11 DIAGNOSIS — M25511 Pain in right shoulder: Secondary | ICD-10-CM | POA: Diagnosis not present

## 2023-06-11 DIAGNOSIS — M6281 Muscle weakness (generalized): Secondary | ICD-10-CM | POA: Diagnosis not present

## 2023-06-12 ENCOUNTER — Ambulatory Visit: Admitting: Emergency Medicine

## 2023-06-16 DIAGNOSIS — M25511 Pain in right shoulder: Secondary | ICD-10-CM | POA: Diagnosis not present

## 2023-06-16 DIAGNOSIS — M25512 Pain in left shoulder: Secondary | ICD-10-CM | POA: Diagnosis not present

## 2023-06-17 ENCOUNTER — Other Ambulatory Visit: Payer: Self-pay | Admitting: Pulmonary Disease

## 2023-06-17 DIAGNOSIS — M25511 Pain in right shoulder: Secondary | ICD-10-CM | POA: Diagnosis not present

## 2023-06-17 DIAGNOSIS — M545 Low back pain, unspecified: Secondary | ICD-10-CM | POA: Diagnosis not present

## 2023-06-17 DIAGNOSIS — G8929 Other chronic pain: Secondary | ICD-10-CM | POA: Diagnosis not present

## 2023-06-17 DIAGNOSIS — F410 Panic disorder [episodic paroxysmal anxiety] without agoraphobia: Secondary | ICD-10-CM

## 2023-06-17 DIAGNOSIS — M6281 Muscle weakness (generalized): Secondary | ICD-10-CM | POA: Diagnosis not present

## 2023-06-17 NOTE — Telephone Encounter (Signed)
**Note De-identified  [place] Obfuscation** Please advise 

## 2023-06-19 DIAGNOSIS — M5416 Radiculopathy, lumbar region: Secondary | ICD-10-CM | POA: Diagnosis not present

## 2023-06-19 DIAGNOSIS — M47816 Spondylosis without myelopathy or radiculopathy, lumbar region: Secondary | ICD-10-CM | POA: Diagnosis not present

## 2023-06-19 DIAGNOSIS — Z9181 History of falling: Secondary | ICD-10-CM | POA: Diagnosis not present

## 2023-06-23 DIAGNOSIS — G8929 Other chronic pain: Secondary | ICD-10-CM | POA: Diagnosis not present

## 2023-06-23 DIAGNOSIS — M6281 Muscle weakness (generalized): Secondary | ICD-10-CM | POA: Diagnosis not present

## 2023-06-23 DIAGNOSIS — M545 Low back pain, unspecified: Secondary | ICD-10-CM | POA: Diagnosis not present

## 2023-06-23 DIAGNOSIS — M25511 Pain in right shoulder: Secondary | ICD-10-CM | POA: Diagnosis not present

## 2023-06-25 ENCOUNTER — Ambulatory Visit: Payer: Medicare Other | Attending: Cardiovascular Disease | Admitting: Cardiovascular Disease

## 2023-06-25 ENCOUNTER — Ambulatory Visit: Payer: Medicare Other | Admitting: Cardiovascular Disease

## 2023-06-25 ENCOUNTER — Encounter: Payer: Self-pay | Admitting: Cardiovascular Disease

## 2023-06-25 VITALS — BP 132/70 | HR 60 | Ht 62.0 in | Wt 125.4 lb

## 2023-06-25 DIAGNOSIS — I447 Left bundle-branch block, unspecified: Secondary | ICD-10-CM | POA: Insufficient documentation

## 2023-06-25 DIAGNOSIS — I493 Ventricular premature depolarization: Secondary | ICD-10-CM | POA: Diagnosis not present

## 2023-06-25 DIAGNOSIS — E782 Mixed hyperlipidemia: Secondary | ICD-10-CM | POA: Diagnosis not present

## 2023-06-25 DIAGNOSIS — R0602 Shortness of breath: Secondary | ICD-10-CM | POA: Diagnosis not present

## 2023-06-25 DIAGNOSIS — I5020 Unspecified systolic (congestive) heart failure: Secondary | ICD-10-CM | POA: Diagnosis not present

## 2023-06-25 DIAGNOSIS — R002 Palpitations: Secondary | ICD-10-CM | POA: Diagnosis not present

## 2023-06-25 DIAGNOSIS — I10 Essential (primary) hypertension: Secondary | ICD-10-CM | POA: Diagnosis not present

## 2023-06-25 MED ORDER — AMIODARONE HCL 200 MG PO TABS: 100.0000 mg | ORAL_TABLET | Freq: Every day | ORAL | 3 refills | Status: AC

## 2023-06-25 NOTE — Progress Notes (Signed)
  Electrophysiology Office Note:    Date:  06/25/2023   ID:  [name], DOB 1933-10-08, MRN [id number]  PCP:  Melida Quitter, MD   [facility] Providers Cardiologist:  Nanetta Batty, MD Electrophysiologist:  Maurice Small, MD     Referring MD: Melida Quitter, MD   History of Present Illness:    [name] is a 89 y.o. female with a medical history significant for CHFrEF, HTN, DM, referred for PVC management.     She had a normal stress test in 2019.  In 2024 she was seen by Dr. Gery Pray for fatigue and noted to be in bigeminal PVCs.  She has been reluctant to have an aggressive evaluation due to her advanced age.  She returned with ongoing palpitations and fatigue.  Monitor showed frequent PVCs at 25.8% burden.  Metoprolol was started.  Echocardiogram showed an ejection fraction of 35 to 40% with wall motion abnormalities.  She reports that her symptoms of palpitations improved after starting metoprolol.  Amiodarone was added at her last visit in January 2025.  Seems this helped some.  Subsequently, Sherryll Burger was started and she became more short of breath and fatigued.      Today, she feels reasonly well.  She does not have palpitations, but she does have ongoing fatigue and shortness of breath.  EKGs/Labs/Other Studies Reviewed Today:     Echocardiogram:  TTE January 2025 EF 35 to 40%.  Mild LVH.  Grade 1 diastolic dysfunction.   Monitors:  14 day monitor 02/2023  -- my interpretation Sinus rhythm predominates.  Heart rate 57 to 121 bpm, average 77 bpm Frequent PVCs, 25.8% burden Patient symptom-triggered events correlated with PVCs    EKG:   EKG Interpretation Date/Time:  Wednesday June 25 2023 16:08:54 EDT Ventricular Rate:  60 PR Interval:  174 QRS Duration:  152 QT Interval:  518 QTC Calculation: 518 R Axis:   128  Text Interpretation: Normal sinus rhythm Right axis deviation Left bundle branch block When compared with ECG of 06-Jun-2023  09:16, QRS axis Shifted right Confirmed by York Pellant [phone]) on 06/25/2023 4:12:53 PM     Physical Exam:    VS:  BP 132/70 (BP Location: Left Arm, Patient Position: Sitting, Cuff Size: Normal)   Pulse 60   Ht 5\' 2"  (1.575 m)   Wt 125 lb 6.4 oz (56.9 kg)   SpO2 97%   BMI 22.94 kg/m     Wt Readings from Last 3 Encounters:  06/25/23 125 lb 6.4 oz (56.9 kg)  06/06/23 132 lb (59.9 kg)  05/19/23 133 lb 12.8 oz (60.7 kg)     GEN: Well nourished, well developed in no acute distress CARDIAC: RRR, no murmurs, rubs, gallops RESPIRATORY:  Normal work of breathing MUSCULOSKELETAL: no edema    ASSESSMENT & PLAN:     Frequent PVCs Possibly contributing to CHFrEF Continue amiodarone Labs June 02, 2023 were reviewed.  TSH and liver enzymes were normal Would consider ischemic evaluation if revascularization is an option  Will decrease amiodarone to 100mg  and continue to monitor. She was instructed to increase the dose back to 200mg  if her palpitations worsen.  Heart failure with reduced ejection fraction EF 35-40% Continue metoprolol XL 25, losartan 100 mg    Signed, Maurice Small, MD  06/25/2023 4:14 PM    [facility]

## 2023-06-25 NOTE — Patient Instructions (Signed)
 Medication Instructions:  DECREASE Amiodarone to 100 mg once daily  *If you need a refill on your cardiac medications before your next appointment, please call your pharmacy*  Follow-Up: At [facility], you and your health needs are our priority.  As part of our continuing mission to provide you with exceptional heart care, our providers are all part of one team.  This team includes your primary Cardiologist (physician) and Advanced Practice Providers or APPs (Physician Assistants and Nurse Practitioners) who all work together to provide you with the care you need, when you need it.  Your next appointment:   1 year(s)  Provider:   York Pellant, MD       1st Floor: - Lobby - Registration  - Pharmacy  - Lab - Cafe  2nd Floor: - PV Lab - Diagnostic Testing (echo, CT, nuclear med)  3rd Floor: - Vacant  4th Floor: - TCTS (cardiothoracic surgery) - AFib Clinic - Structural Heart Clinic - Vascular Surgery  - Vascular Ultrasound  5th Floor: - HeartCare Cardiology (general and EP) - Clinical Pharmacy for coumadin, hypertension, lipid, weight-loss medications, and med management appointments    Valet parking services will be available as well.

## 2023-06-26 LAB — BASIC METABOLIC PANEL WITH GFR
BUN/Creatinine Ratio: 26 (ref 12–28)
BUN: 21 mg/dL (ref 8–27)
CO2: 20 mmol/L (ref 20–29)
Calcium: 9.2 mg/dL (ref 8.7–10.3)
Chloride: 97 mmol/L (ref 96–106)
Creatinine, Ser: 0.82 mg/dL (ref 0.57–1.00)
Glucose: 97 mg/dL (ref 70–99)
Potassium: 4.2 mmol/L (ref 3.5–5.2)
Sodium: 135 mmol/L (ref 134–144)
eGFR: 69 mL/min/{1.73_m2} (ref >59)

## 2023-06-27 DIAGNOSIS — M545 Low back pain, unspecified: Secondary | ICD-10-CM | POA: Diagnosis not present

## 2023-06-27 DIAGNOSIS — G8929 Other chronic pain: Secondary | ICD-10-CM | POA: Diagnosis not present

## 2023-06-27 DIAGNOSIS — M25511 Pain in right shoulder: Secondary | ICD-10-CM | POA: Diagnosis not present

## 2023-06-27 DIAGNOSIS — M6281 Muscle weakness (generalized): Secondary | ICD-10-CM | POA: Diagnosis not present

## 2023-06-30 DIAGNOSIS — M25511 Pain in right shoulder: Secondary | ICD-10-CM | POA: Diagnosis not present

## 2023-06-30 DIAGNOSIS — M545 Low back pain, unspecified: Secondary | ICD-10-CM | POA: Diagnosis not present

## 2023-06-30 DIAGNOSIS — G8929 Other chronic pain: Secondary | ICD-10-CM | POA: Diagnosis not present

## 2023-06-30 DIAGNOSIS — M6281 Muscle weakness (generalized): Secondary | ICD-10-CM | POA: Diagnosis not present

## 2023-07-04 DIAGNOSIS — M6281 Muscle weakness (generalized): Secondary | ICD-10-CM | POA: Diagnosis not present

## 2023-07-04 DIAGNOSIS — M545 Low back pain, unspecified: Secondary | ICD-10-CM | POA: Diagnosis not present

## 2023-07-04 DIAGNOSIS — M25511 Pain in right shoulder: Secondary | ICD-10-CM | POA: Diagnosis not present

## 2023-07-04 DIAGNOSIS — G8929 Other chronic pain: Secondary | ICD-10-CM | POA: Diagnosis not present

## 2023-07-07 ENCOUNTER — Ambulatory Visit (HOSPITAL_COMMUNITY): Payer: Medicare Other | Attending: Cardiology

## 2023-07-07 DIAGNOSIS — E782 Mixed hyperlipidemia: Secondary | ICD-10-CM | POA: Diagnosis not present

## 2023-07-07 DIAGNOSIS — I447 Left bundle-branch block, unspecified: Secondary | ICD-10-CM | POA: Insufficient documentation

## 2023-07-07 DIAGNOSIS — I5020 Unspecified systolic (congestive) heart failure: Secondary | ICD-10-CM | POA: Insufficient documentation

## 2023-07-07 DIAGNOSIS — I493 Ventricular premature depolarization: Secondary | ICD-10-CM | POA: Insufficient documentation

## 2023-07-07 DIAGNOSIS — R002 Palpitations: Secondary | ICD-10-CM | POA: Diagnosis not present

## 2023-07-07 DIAGNOSIS — R0602 Shortness of breath: Secondary | ICD-10-CM | POA: Insufficient documentation

## 2023-07-07 DIAGNOSIS — I10 Essential (primary) hypertension: Secondary | ICD-10-CM | POA: Diagnosis not present

## 2023-07-07 LAB — ECHOCARDIOGRAM COMPLETE
Area-P 1/2: 2.83 cm2
MV M vel: 4.77 m/s
MV Peak grad: 91 mmHg
P 1/2 time: 674 ms
Radius: 0.5 cm
S' Lateral: 2.9 cm

## 2023-07-08 DIAGNOSIS — M6281 Muscle weakness (generalized): Secondary | ICD-10-CM | POA: Diagnosis not present

## 2023-07-08 DIAGNOSIS — M545 Low back pain, unspecified: Secondary | ICD-10-CM | POA: Diagnosis not present

## 2023-07-08 DIAGNOSIS — G8929 Other chronic pain: Secondary | ICD-10-CM | POA: Diagnosis not present

## 2023-07-08 DIAGNOSIS — M25511 Pain in right shoulder: Secondary | ICD-10-CM | POA: Diagnosis not present

## 2023-07-09 DIAGNOSIS — M6281 Muscle weakness (generalized): Secondary | ICD-10-CM | POA: Diagnosis not present

## 2023-07-09 DIAGNOSIS — M25511 Pain in right shoulder: Secondary | ICD-10-CM | POA: Diagnosis not present

## 2023-07-09 DIAGNOSIS — M545 Low back pain, unspecified: Secondary | ICD-10-CM | POA: Diagnosis not present

## 2023-07-09 DIAGNOSIS — G8929 Other chronic pain: Secondary | ICD-10-CM | POA: Diagnosis not present

## 2023-07-14 ENCOUNTER — Ambulatory Visit (INDEPENDENT_AMBULATORY_CARE_PROVIDER_SITE_OTHER)

## 2023-07-14 ENCOUNTER — Ambulatory Visit: Payer: Medicare Other | Attending: Cardiovascular Disease | Admitting: Cardiovascular Disease

## 2023-07-14 ENCOUNTER — Encounter: Payer: Self-pay | Admitting: Cardiovascular Disease

## 2023-07-14 VITALS — BP 128/70 | HR 60 | Ht 60.0 in | Wt 129.4 lb

## 2023-07-14 DIAGNOSIS — I493 Ventricular premature depolarization: Secondary | ICD-10-CM | POA: Insufficient documentation

## 2023-07-14 DIAGNOSIS — R002 Palpitations: Secondary | ICD-10-CM

## 2023-07-14 DIAGNOSIS — I10 Essential (primary) hypertension: Secondary | ICD-10-CM | POA: Insufficient documentation

## 2023-07-14 DIAGNOSIS — E782 Mixed hyperlipidemia: Secondary | ICD-10-CM | POA: Diagnosis not present

## 2023-07-14 DIAGNOSIS — I519 Heart disease, unspecified: Secondary | ICD-10-CM | POA: Insufficient documentation

## 2023-07-14 NOTE — Assessment & Plan Note (Signed)
 EF by echo in January of this year was 35 to 40%.  It is presumed nonischemic.  She did have a Myoview  stress test performed in 2019 which was low risk and nonischemic.  She denies chest pain or shortness of breath.  He was presumed that her LV dysfunction may have been related to her excessive PVCs although Dr. Arlester Ladd brought up the possibility that her PVCs were ischemically mediated.  She does not wish an invasive evaluation.  Her most recent 2D echo after treatment here for PVCs 07/07/23 revealed an improvement in her EF up to 50 to 55%.

## 2023-07-14 NOTE — Progress Notes (Unsigned)
 Enrolled for [employer] to mail a ZIO XT long term holter monitor to the patients address on file.

## 2023-07-14 NOTE — Progress Notes (Signed)
 07/14/2023 [name]   June 23, 1933  [id number]  Primary Physician Charolet Cope Chales [place], MD Primary Cardiologist: Avanell Leigh MD Bennye Bravo, [place]  HPI:  [name] is a 89 y.o.  mildly-overweight married Caucasian female, mother of 3 and grandmother of 1 grandchild, who worked as a Conservator, museum/gallery. I last saw her in the office 12/10/2022.  She is accompanied by her daughter [name] today.  Her husband [name] was a patient of mine as well unfortunately had  [name] body dementia and passed away in 09/28/23 of last year.  Since I last saw her she has moved to [facility] in [place]. Her risk factors include type 2 diabetes, hypertension, and hyperlipidemia. She does have fibromyalgia. She has had a normal 2D echo and Myoview  stress test in the past.  Since I saw her a little over a year ago she unfortunately had to have a redo right total hip replacement on 09/24/2016 by Dr. Bernard Brick.  She denies chest pain or shortness of breath.  She does have purplish discoloration of both feet despite normal pulses and normal Doppler studies for unclear reasons.   She saw Friddie Jetty  NP in the office 01/29/2018 with atypical chest pain and asthma type symptoms.  Myoview  stress test was low risk nonischemic, and echo revealed normal LV function with a anteroapical wall motion abnormality.  I did not think her symptoms were ischemically mediated.   Since I saw her in the office 8 months ago she continues to do well.  She continues to be asymptomatic.  She was referred to Dr. Arlester Ladd, EP, because of her high frequency of PVCs (29% burden by Zio patch) and was begun on amiodarone  which was initially 200 mg a day and has been down titrated as well as a beta-blocker.  She is asymptomatic from these.  Her most recent 2D echo performed/14/25 revealed an improvement in her EF from 61 to 40% in January of this year up to 50 to 55% presumably as result of treatment of her  PVCs.   Current Meds  Medication Sig   acetaminophen  (TYLENOL ) 500 MG tablet Take by mouth as needed.   albuterol  (VENTOLIN  HFA) 108 (90 Base) MCG/ACT inhaler Inhale 2 puffs into the lungs every 6 (six) hours as needed for wheezing or shortness of breath.   ALPRAZolam  (XANAX ) 0.25 MG tablet TAKE 1/2 TABLET BY MOUTH TWICE DAILY AS NEEDED FOR ANXIETY   amiodarone  (PACERONE ) 200 MG tablet Take 0.5 tablets (100 mg total) by mouth daily.   amLODipine  (NORVASC ) 10 MG tablet Take 10 mg by mouth daily.   azelastine  (ASTELIN ) 0.1 % nasal spray USE 1 SPRAY IN EACH NOSTRIL TWICE DAILY   busPIRone  (BUSPAR ) 7.5 MG tablet Take 7.5 mg by mouth 2 (two) times daily.   cetirizine  (ZYRTEC ) 10 MG tablet Take 10 mg by mouth daily.    Cholecalciferol (VITAMIN D3) 5000 units TABS 5,000 IU OTC vitamin D3 daily.   ciprofloxacin  (CIPRO ) 500 MG tablet Take 500 mg by mouth 2 (two) times daily.   Cyanocobalamin  (VITAMIN B-12) 2500 MCG TABS Take 1,250 mcg by mouth daily.   docusate sodium  (COLACE) 100 MG capsule Take 100 mg by mouth daily as needed.   escitalopram  (LEXAPRO ) 10 MG tablet Take 10 mg by mouth at bedtime.   fluticasone  (FLONASE ) 50 MCG/ACT nasal spray USE 2 SPRAYS INTO EACH NOSTRIL EVERY DAY   HYDROcodone -acetaminophen  (NORCO/VICODIN) 5-325 MG tablet Take 1 tablet by mouth 3 (  three) times daily as needed.   losartan  (COZAAR ) 100 MG tablet Take 1 tablet (100 mg total) by mouth daily.   metoprolol  succinate (TOPROL  XL) 25 MG 24 hr tablet Take 1 tablet (25 mg total) by mouth daily.   mirabegron ER (MYRBETRIQ) 25 MG TB24 tablet    montelukast  (SINGULAIR ) 10 MG tablet TAKE 1 TABLET BY MOUTH DAILY AT BEDTIME   nitrofurantoin , macrocrystal-monohydrate, (MACROBID ) 100 MG capsule Take 1 capsule (100 mg total) by mouth 2 (two) times daily.   omeprazole  (PRILOSEC) 20 MG capsule TAKE 1 CAPSULE BY MOUTH DAILY   silver  sulfADIAZINE  (SILVADENE ) 1 % cream Apply 1 Application topically daily.   SYMBICORT  80-4.5 MCG/ACT  inhaler INHALE 2 PUFFS BY MOUTH INTO THE LUNGS TWICE A DAY   Vibegron (GEMTESA) 75 MG TABS Take 75 mg by mouth daily.     Allergies  Allergen Reactions   Chlorthalidone      Other reaction(s): hyponatremia   Morphine    Morphine Sulfate Other (See Comments)    Makes her hyper    Social History   Socioeconomic History   Marital status: Widowed    Spouse name: Not on file   Number of children: 2   Years of education: 48   Highest education level: Master's degree (e.g., MA, MS, MEng, MEd, MSW, MBA)  Occupational History   Occupation: Retired    Comment: Head of [employer]  Tobacco Use   Smoking status: Former    Current packs/day: 0.00    Average packs/day: 0.1 packs/day for 10.0 years (1.0 ttl pk-yrs)    Types: Cigarettes    Start date: 03/25/1958    Quit date: 03/25/1968    Years since quitting: 55.3   Smokeless tobacco: Never  Vaping Use   Vaping status: Never Used  Substance and Sexual Activity   Alcohol  use: No    Alcohol /week: 0.0 standard drinks of alcohol    Drug use: No   Sexual activity: Not Currently  Other Topics Concern   Not on file  Social History Narrative   Lives alone  in a 3 story home.  Has 2 children.  Retired Print production planner.  Education: Event organiser. Right handed    Social Drivers of Corporate investment banker Strain: Not on file  Food Insecurity: Not on file  Transportation Needs: Not on file  Physical Activity: Not on file  Stress: Not on file  Social Connections: Unknown (08/03/2021)   Received [facility], [facility]   Social Network    Social Network: Not on file  Intimate Partner Violence: Unknown (06/26/2021)   Received from [facility], [facility]   HITS    Physically Hurt: Not on file    Insult or Talk Down To: Not on file    Threaten Physical Harm: Not on file    Scream or Curse: Not on file     Review of Systems: General: negative for chills, fever, night sweats or weight changes.  Cardiovascular: negative  for chest pain, dyspnea on exertion, edema, orthopnea, palpitations, paroxysmal nocturnal dyspnea or shortness of breath Dermatological: negative for rash Respiratory: negative for cough or wheezing Urologic: negative for hematuria Abdominal: negative for nausea, vomiting, diarrhea, bright red blood per rectum, melena, or hematemesis Neurologic: negative for visual changes, syncope, or dizziness All other systems reviewed and are otherwise negative except as noted above.    Blood pressure 128/70, pulse 60, height 5' (1.524 m), weight 129 lb 6.4 oz (58.7 kg), SpO2 94%.  General appearance:  alert and no distress Neck: no adenopathy, no carotid bruit, no JVD, supple, symmetrical, trachea midline, and thyroid  not enlarged, symmetric, no tenderness/mass/nodules Lungs: clear to auscultation bilaterally Heart: regular rate and rhythm, S1, S2 normal, no murmur, click, rub or gallop Extremities: extremities normal, atraumatic, no cyanosis or edema Pulses: 2+ and symmetric Skin: Skin color, texture, turgor normal. No rashes or lesions Neurologic: Grossly normal  EKG not performed today      ASSESSMENT AND PLAN:   Hyperlipidemia History of hyperlipidemia intolerant to statin therapy with lipid profile performed 11/15/2022 revealing total cholesterol 322, LDL 226 and HDL 79.  Given her age and lack of symptoms I do not feel compelled to treat her with alternative therapies at this time.  Essential hypertension History of essential hypertension with blood pressure measured today at 128/70.  She is on amlodipine  and metoprolol  as well as losartan .  Frequent PVCs History of frequent PVCs with a monitor that showed "29% burden".  She was seen by Dr. Arlester Ladd, EP, and begun on amiodarone  and beta-blocker.  She is unaware of the PVCs.  I am going to repeat her 1 week Zio patch to assess for treatment efficacy.  LV dysfunction EF by echo in January of this year was 35 to 40%.  It is presumed  nonischemic.  She did have a Myoview  stress test performed in 2019 which was low risk and nonischemic.  She denies chest pain or shortness of breath.  He was presumed that her LV dysfunction may have been related to her excessive PVCs although Dr. Arlester Ladd brought up the possibility that her PVCs were ischemically mediated.  She does not wish an invasive evaluation.  Her most recent 2D echo after treatment here for PVCs 07/07/23 revealed an improvement in her EF up to 50 to 55%.     Avanell Leigh MD FACP,FACC,FAHA, [facility] 07/14/2023 11:54 AM

## 2023-07-14 NOTE — Assessment & Plan Note (Signed)
 History of hyperlipidemia intolerant to statin therapy with lipid profile performed 11/15/2022 revealing total cholesterol 322, LDL 226 and HDL 79.  Given her age and lack of symptoms I do not feel compelled to treat her with alternative therapies at this time.

## 2023-07-14 NOTE — Patient Instructions (Addendum)
 Medication Instructions:  Your physician recommends that you continue on your current medications as directed. Please refer to the Current Medication list given to you today.  *If you need a refill on your cardiac medications before your next appointment, please call your pharmacy*   Testing/Procedures: ZIO XT- Long Term Monitor Instructions  Your physician has requested you wear a ZIO patch monitor for 7 days.  This is a single patch monitor. [employer] supplies one patch monitor per enrollment. Additional stickers are not available. Please do not apply patch if you will be having a Nuclear Stress Test,  Echocardiogram, Cardiac CT, MRI, or Chest Xray during the period you would be wearing the  monitor. The patch cannot be worn during these tests. You cannot remove and re-apply the  ZIO XT patch monitor.  Your ZIO patch monitor will be mailed 3 day USPS to your address on file. It may take 3-5 days  to receive your monitor after you have been enrolled.  Once you have received your monitor, please review the enclosed instructions. Your monitor  has already been registered assigning a specific monitor serial # to you.  Billing and Patient Assistance Program Information  We have supplied [employer] with any of your insurance information on file for billing purposes. [employer] offers a sliding scale Patient Assistance Program for patients that do not have  insurance, or whose insurance does not completely cover the cost of the ZIO monitor.  You must apply for the Patient Assistance Program to qualify for this discounted rate.  To apply, please call [employer] at [phone], select option 4, select option 2, ask to apply for  Patient Assistance Program. Sanna Crystal will ask your household income, and how many people  are in your household. They will quote your out-of-pocket cost based on that information.  [employer] will also be able to set up a 72-month, interest-free payment plan if needed.  Applying  the monitor   Shave hair from upper left chest.  Hold abrader disc by orange tab. Rub abrader in 40 strokes over the upper left chest as  indicated in your monitor instructions.  Clean area with 4 enclosed alcohol  pads. Let dry.  Apply patch as indicated in monitor instructions. Patch will be placed under collarbone on left  side of chest with arrow pointing upward.  Rub patch adhesive wings for 2 minutes. Remove white label marked "1". Remove the white  label marked "2". Rub patch adhesive wings for 2 additional minutes.  While looking in a mirror, press and release button in center of patch. A small green light will  flash 3-4 times. This will be your only indicator that the monitor has been turned on.  Do not shower for the first 24 hours. You may shower after the first 24 hours.  Press the button if you feel a symptom. You will hear a small click. Record Date, Time and  Symptom in the Patient Logbook.  When you are ready to remove the patch, follow instructions on the last 2 pages of Patient  Logbook. Stick patch monitor onto the last page of Patient Logbook.  Place Patient Logbook in the blue and white box. Use locking tab on box and tape box closed  securely. The blue and white box has prepaid postage on it. Please place it in the mailbox as  soon as possible. Your physician should have your test results approximately 7 days after the  monitor has been mailed back to [facility].  Call [facility]  at [phone] if you have questions regarding  your ZIO XT patch monitor. Call them immediately if you see an orange light blinking on your  monitor.  If your monitor falls off in less than 4 days, contact our Monitor department at [phone].  If your monitor becomes loose or falls off after 4 days call [employer] at [phone] for  suggestions on securing your monitor   Follow-Up: At [facility], you and your health needs are our priority.  As part  of our continuing mission to provide you with exceptional heart care, our providers are all part of one team.  This team includes your primary Cardiologist (physician) and Advanced Practice Providers or APPs (Physician Assistants and Nurse Practitioners) who all work together to provide you with the care you need, when you need it.  Your next appointment:   6 month(s)  Provider:   Katlyn West, NP       Then, Lauro Portal, MD will plan to see you again in 12 month(s).    We recommend signing up for the patient portal called "MyChart".  Sign up information is provided on this After Visit Summary.  MyChart is used to connect with patients for Virtual Visits (Telemedicine).  Patients are able to view lab/test results, encounter notes, upcoming appointments, etc.  Non-urgent messages can be sent to your provider as well.   To learn more about what you can do with MyChart, go to [email or web address].   Other Instructions       1st Floor: - Lobby - Registration  - Pharmacy  - Lab - Cafe  2nd Floor: - PV Lab - Diagnostic Testing (echo, CT, nuclear med)  3rd Floor: - Vacant  4th Floor: - TCTS (cardiothoracic surgery) - AFib Clinic - Structural Heart Clinic - Vascular Surgery  - Vascular Ultrasound  5th Floor: - HeartCare Cardiology (general and EP) - Clinical Pharmacy for coumadin, hypertension, lipid, weight-loss medications, and med management appointments    Valet parking services will be available as well.

## 2023-07-14 NOTE — Assessment & Plan Note (Signed)
 History of essential hypertension with blood pressure measured today at 128/70.  She is on amlodipine  and metoprolol  as well as losartan .

## 2023-07-14 NOTE — Assessment & Plan Note (Signed)
 History of frequent PVCs with a monitor that showed "29% burden".  She was seen by Dr. Arlester Ladd, EP, and begun on amiodarone  and beta-blocker.  She is unaware of the PVCs.  I am going to repeat her 1 week Zio patch to assess for treatment efficacy.

## 2023-07-15 DIAGNOSIS — M25511 Pain in right shoulder: Secondary | ICD-10-CM | POA: Diagnosis not present

## 2023-07-15 DIAGNOSIS — M6281 Muscle weakness (generalized): Secondary | ICD-10-CM | POA: Diagnosis not present

## 2023-07-15 DIAGNOSIS — G8929 Other chronic pain: Secondary | ICD-10-CM | POA: Diagnosis not present

## 2023-07-15 DIAGNOSIS — M545 Low back pain, unspecified: Secondary | ICD-10-CM | POA: Diagnosis not present

## 2023-07-17 DIAGNOSIS — M6281 Muscle weakness (generalized): Secondary | ICD-10-CM | POA: Diagnosis not present

## 2023-07-17 DIAGNOSIS — M545 Low back pain, unspecified: Secondary | ICD-10-CM | POA: Diagnosis not present

## 2023-07-17 DIAGNOSIS — G8929 Other chronic pain: Secondary | ICD-10-CM | POA: Diagnosis not present

## 2023-07-17 DIAGNOSIS — M25511 Pain in right shoulder: Secondary | ICD-10-CM | POA: Diagnosis not present

## 2023-07-21 DIAGNOSIS — M545 Low back pain, unspecified: Secondary | ICD-10-CM | POA: Diagnosis not present

## 2023-07-21 DIAGNOSIS — M47816 Spondylosis without myelopathy or radiculopathy, lumbar region: Secondary | ICD-10-CM | POA: Diagnosis not present

## 2023-07-21 DIAGNOSIS — M5136 Other intervertebral disc degeneration, lumbar region with discogenic back pain only: Secondary | ICD-10-CM | POA: Diagnosis not present

## 2023-07-22 DIAGNOSIS — G8929 Other chronic pain: Secondary | ICD-10-CM | POA: Diagnosis not present

## 2023-07-22 DIAGNOSIS — M6281 Muscle weakness (generalized): Secondary | ICD-10-CM | POA: Diagnosis not present

## 2023-07-22 DIAGNOSIS — M25511 Pain in right shoulder: Secondary | ICD-10-CM | POA: Diagnosis not present

## 2023-07-22 DIAGNOSIS — M545 Low back pain, unspecified: Secondary | ICD-10-CM | POA: Diagnosis not present

## 2023-07-24 DIAGNOSIS — M25511 Pain in right shoulder: Secondary | ICD-10-CM | POA: Diagnosis not present

## 2023-07-24 DIAGNOSIS — G8929 Other chronic pain: Secondary | ICD-10-CM | POA: Diagnosis not present

## 2023-07-24 DIAGNOSIS — M545 Low back pain, unspecified: Secondary | ICD-10-CM | POA: Diagnosis not present

## 2023-07-24 DIAGNOSIS — M6281 Muscle weakness (generalized): Secondary | ICD-10-CM | POA: Diagnosis not present

## 2023-07-29 DIAGNOSIS — M545 Low back pain, unspecified: Secondary | ICD-10-CM | POA: Diagnosis not present

## 2023-07-29 DIAGNOSIS — G8929 Other chronic pain: Secondary | ICD-10-CM | POA: Diagnosis not present

## 2023-07-29 DIAGNOSIS — M25511 Pain in right shoulder: Secondary | ICD-10-CM | POA: Diagnosis not present

## 2023-07-29 DIAGNOSIS — M6281 Muscle weakness (generalized): Secondary | ICD-10-CM | POA: Diagnosis not present

## 2023-07-31 DIAGNOSIS — I493 Ventricular premature depolarization: Secondary | ICD-10-CM | POA: Diagnosis not present

## 2023-07-31 DIAGNOSIS — M47816 Spondylosis without myelopathy or radiculopathy, lumbar region: Secondary | ICD-10-CM | POA: Diagnosis not present

## 2023-07-31 DIAGNOSIS — R002 Palpitations: Secondary | ICD-10-CM | POA: Diagnosis not present

## 2023-08-01 DIAGNOSIS — R002 Palpitations: Secondary | ICD-10-CM | POA: Diagnosis not present

## 2023-08-01 DIAGNOSIS — I493 Ventricular premature depolarization: Secondary | ICD-10-CM | POA: Diagnosis not present

## 2023-08-05 ENCOUNTER — Ambulatory Visit: Payer: Self-pay | Admitting: *Deleted

## 2023-08-05 DIAGNOSIS — M545 Low back pain, unspecified: Secondary | ICD-10-CM | POA: Diagnosis not present

## 2023-08-05 DIAGNOSIS — M25511 Pain in right shoulder: Secondary | ICD-10-CM | POA: Diagnosis not present

## 2023-08-05 DIAGNOSIS — M6281 Muscle weakness (generalized): Secondary | ICD-10-CM | POA: Diagnosis not present

## 2023-08-05 DIAGNOSIS — G8929 Other chronic pain: Secondary | ICD-10-CM | POA: Diagnosis not present

## 2023-08-07 DIAGNOSIS — M25511 Pain in right shoulder: Secondary | ICD-10-CM | POA: Diagnosis not present

## 2023-08-07 DIAGNOSIS — M545 Low back pain, unspecified: Secondary | ICD-10-CM | POA: Diagnosis not present

## 2023-08-07 DIAGNOSIS — G8929 Other chronic pain: Secondary | ICD-10-CM | POA: Diagnosis not present

## 2023-08-07 DIAGNOSIS — M6281 Muscle weakness (generalized): Secondary | ICD-10-CM | POA: Diagnosis not present

## 2023-08-08 DIAGNOSIS — Z961 Presence of intraocular lens: Secondary | ICD-10-CM | POA: Diagnosis not present

## 2023-08-08 DIAGNOSIS — H04123 Dry eye syndrome of bilateral lacrimal glands: Secondary | ICD-10-CM | POA: Diagnosis not present

## 2023-08-08 DIAGNOSIS — H35372 Puckering of macula, left eye: Secondary | ICD-10-CM | POA: Diagnosis not present

## 2023-08-08 DIAGNOSIS — H40013 Open angle with borderline findings, low risk, bilateral: Secondary | ICD-10-CM | POA: Diagnosis not present

## 2023-08-08 DIAGNOSIS — H353131 Nonexudative age-related macular degeneration, bilateral, early dry stage: Secondary | ICD-10-CM | POA: Diagnosis not present

## 2023-08-08 DIAGNOSIS — H01024 Squamous blepharitis left upper eyelid: Secondary | ICD-10-CM | POA: Diagnosis not present

## 2023-08-08 DIAGNOSIS — H01021 Squamous blepharitis right upper eyelid: Secondary | ICD-10-CM | POA: Diagnosis not present

## 2023-08-12 DIAGNOSIS — G8929 Other chronic pain: Secondary | ICD-10-CM | POA: Diagnosis not present

## 2023-08-12 DIAGNOSIS — M545 Low back pain, unspecified: Secondary | ICD-10-CM | POA: Diagnosis not present

## 2023-08-12 DIAGNOSIS — M25511 Pain in right shoulder: Secondary | ICD-10-CM | POA: Diagnosis not present

## 2023-08-12 DIAGNOSIS — M6281 Muscle weakness (generalized): Secondary | ICD-10-CM | POA: Diagnosis not present

## 2023-08-14 DIAGNOSIS — M25511 Pain in right shoulder: Secondary | ICD-10-CM | POA: Diagnosis not present

## 2023-08-14 DIAGNOSIS — M545 Low back pain, unspecified: Secondary | ICD-10-CM | POA: Diagnosis not present

## 2023-08-14 DIAGNOSIS — G8929 Other chronic pain: Secondary | ICD-10-CM | POA: Diagnosis not present

## 2023-08-14 DIAGNOSIS — M6281 Muscle weakness (generalized): Secondary | ICD-10-CM | POA: Diagnosis not present

## 2023-08-20 DIAGNOSIS — N3020 Other chronic cystitis without hematuria: Secondary | ICD-10-CM | POA: Diagnosis not present

## 2023-08-20 DIAGNOSIS — N3946 Mixed incontinence: Secondary | ICD-10-CM | POA: Diagnosis not present

## 2023-08-26 DIAGNOSIS — M6281 Muscle weakness (generalized): Secondary | ICD-10-CM | POA: Diagnosis not present

## 2023-08-26 DIAGNOSIS — G8929 Other chronic pain: Secondary | ICD-10-CM | POA: Diagnosis not present

## 2023-08-26 DIAGNOSIS — M25511 Pain in right shoulder: Secondary | ICD-10-CM | POA: Diagnosis not present

## 2023-08-26 DIAGNOSIS — M545 Low back pain, unspecified: Secondary | ICD-10-CM | POA: Diagnosis not present

## 2023-08-29 DIAGNOSIS — M545 Low back pain, unspecified: Secondary | ICD-10-CM | POA: Diagnosis not present

## 2023-08-29 DIAGNOSIS — M25511 Pain in right shoulder: Secondary | ICD-10-CM | POA: Diagnosis not present

## 2023-08-29 DIAGNOSIS — G8929 Other chronic pain: Secondary | ICD-10-CM | POA: Diagnosis not present

## 2023-08-29 DIAGNOSIS — M6281 Muscle weakness (generalized): Secondary | ICD-10-CM | POA: Diagnosis not present

## 2023-08-31 DIAGNOSIS — M5416 Radiculopathy, lumbar region: Secondary | ICD-10-CM | POA: Diagnosis not present

## 2023-09-04 DIAGNOSIS — M5416 Radiculopathy, lumbar region: Secondary | ICD-10-CM | POA: Diagnosis not present

## 2023-09-09 DIAGNOSIS — G8929 Other chronic pain: Secondary | ICD-10-CM | POA: Diagnosis not present

## 2023-09-09 DIAGNOSIS — M25511 Pain in right shoulder: Secondary | ICD-10-CM | POA: Diagnosis not present

## 2023-09-09 DIAGNOSIS — M6281 Muscle weakness (generalized): Secondary | ICD-10-CM | POA: Diagnosis not present

## 2023-09-09 DIAGNOSIS — M545 Low back pain, unspecified: Secondary | ICD-10-CM | POA: Diagnosis not present

## 2023-09-11 DIAGNOSIS — G8929 Other chronic pain: Secondary | ICD-10-CM | POA: Diagnosis not present

## 2023-09-11 DIAGNOSIS — M6281 Muscle weakness (generalized): Secondary | ICD-10-CM | POA: Diagnosis not present

## 2023-09-11 DIAGNOSIS — M25511 Pain in right shoulder: Secondary | ICD-10-CM | POA: Diagnosis not present

## 2023-09-11 DIAGNOSIS — M545 Low back pain, unspecified: Secondary | ICD-10-CM | POA: Diagnosis not present

## 2023-09-16 DIAGNOSIS — M6281 Muscle weakness (generalized): Secondary | ICD-10-CM | POA: Diagnosis not present

## 2023-09-16 DIAGNOSIS — M25511 Pain in right shoulder: Secondary | ICD-10-CM | POA: Diagnosis not present

## 2023-09-16 DIAGNOSIS — G8929 Other chronic pain: Secondary | ICD-10-CM | POA: Diagnosis not present

## 2023-09-16 DIAGNOSIS — M545 Low back pain, unspecified: Secondary | ICD-10-CM | POA: Diagnosis not present

## 2023-09-18 DIAGNOSIS — M545 Low back pain, unspecified: Secondary | ICD-10-CM | POA: Diagnosis not present

## 2023-09-18 DIAGNOSIS — M25511 Pain in right shoulder: Secondary | ICD-10-CM | POA: Diagnosis not present

## 2023-09-18 DIAGNOSIS — M6281 Muscle weakness (generalized): Secondary | ICD-10-CM | POA: Diagnosis not present

## 2023-09-18 DIAGNOSIS — G8929 Other chronic pain: Secondary | ICD-10-CM | POA: Diagnosis not present

## 2023-09-19 ENCOUNTER — Other Ambulatory Visit: Payer: Self-pay | Admitting: Pulmonary Disease

## 2023-09-19 DIAGNOSIS — F410 Panic disorder [episodic paroxysmal anxiety] without agoraphobia: Secondary | ICD-10-CM

## 2023-09-19 NOTE — Telephone Encounter (Signed)
 Refilled - needs OV for f/u

## 2023-09-19 NOTE — Telephone Encounter (Signed)
 Dr. Judeth Horn, please advise.

## 2023-09-22 NOTE — Telephone Encounter (Signed)
 Lm for patient.

## 2023-09-23 ENCOUNTER — Ambulatory Visit (INDEPENDENT_AMBULATORY_CARE_PROVIDER_SITE_OTHER): Payer: Medicare Other | Admitting: Pulmonary Disease

## 2023-09-23 ENCOUNTER — Encounter: Payer: Self-pay | Admitting: Pulmonary Disease

## 2023-09-23 VITALS — BP 127/66 | HR 59 | Ht 63.0 in | Wt 130.0 lb

## 2023-09-23 DIAGNOSIS — M25511 Pain in right shoulder: Secondary | ICD-10-CM | POA: Diagnosis not present

## 2023-09-23 DIAGNOSIS — F411 Generalized anxiety disorder: Secondary | ICD-10-CM | POA: Diagnosis not present

## 2023-09-23 DIAGNOSIS — J4531 Mild persistent asthma with (acute) exacerbation: Secondary | ICD-10-CM | POA: Diagnosis not present

## 2023-09-23 DIAGNOSIS — G8929 Other chronic pain: Secondary | ICD-10-CM | POA: Diagnosis not present

## 2023-09-23 DIAGNOSIS — M6281 Muscle weakness (generalized): Secondary | ICD-10-CM | POA: Diagnosis not present

## 2023-09-23 DIAGNOSIS — M545 Low back pain, unspecified: Secondary | ICD-10-CM | POA: Diagnosis not present

## 2023-09-23 NOTE — Patient Instructions (Signed)
 I am glad your breathing is doing well!  No change in the medication  Let me know if you need refills  Return to clinic in 6 months or sooner as needed with Dr. Annella

## 2023-09-23 NOTE — Telephone Encounter (Signed)
Appt scheduled 7/1

## 2023-09-23 NOTE — Progress Notes (Signed)
 Synopsis: Longstanding patient of Dr. McQuaid managed here for asthma  Subjective:   PATIENT ID: Sonya DELENA Carpen GENDER: female DOB: 01/22/1934, MRN: [id number]  Chief Complaint  Patient presents with   Follow-up    HPI Here for asthma, recurrent sinus congestion complicated by anxiety disorder. Regimen consists of symbicort  80/4.5 BID, singulair ,  PRN xanax .  Most recent cardiology note x 3 reviewed.  Breathing feels fine.  No real changes.  Rare albuterol  use - can't remember last dose. Using Symbicort  and Singulair  as prescribed.  She finds Xanax  particular helpful in terms of breathing and panic attack avoidance.  Labs reviewed and notable for:  IgE 3 2017, 4 2009 Eos not elevated TSH WNL  ROS No orthopnea or PND. No chest pain. Comprehensive review of systems negative.   Objective:  GEN: elderly woman in NAD HEENT: MMM, trachea midline CV: RRR, ext warm PULM: clear in all lung fields, no accessory muscle use GI: Soft, +BS EXT: no edema PSYCH: AOx3, normal affect SKIN: No rashes    Vitals:   09/23/23 1113  BP: 127/66  Pulse: (!) 59  SpO2: 97%  Weight: 130 lb (59 kg)  Height: 5' 3 (1.6 m)   97% on RA BMI Readings from Last 3 Encounters:  09/23/23 23.03 kg/m  07/14/23 25.27 kg/m  06/25/23 22.94 kg/m   Wt Readings from Last 3 Encounters:  09/23/23 130 lb (59 kg)  07/14/23 129 lb 6.4 oz (58.7 kg)  06/25/23 125 lb 6.4 oz (56.9 kg)     CBC    Component Value Date/Time   WBC 8.6 02/25/2023 1530   WBC 7.6 04/04/2022 1712   RBC 3.95 02/25/2023 1530   RBC 4.24 04/04/2022 1712   HGB 12.4 02/25/2023 1530   HGB 12.6 05/20/2011 1331   HCT 37.0 02/25/2023 1530   HCT 37.5 05/20/2011 1331   PLT 190 02/25/2023 1530   MCV 94 02/25/2023 1530   MCV 93.7 05/20/2011 1331   MCH 31.4 02/25/2023 1530   MCH 30.7 04/04/2022 1712   MCHC 33.5 02/25/2023 1530   MCHC 32.5 04/04/2022 1712   RDW 13.4 02/25/2023 1530   RDW 15.3 (H) 05/20/2011 1331   LYMPHSABS 1.6  04/04/2022 1712   LYMPHSABS 1.2 03/29/2019 1046   LYMPHSABS 1.3 05/20/2011 1331   MONOABS 0.6 04/04/2022 1712   MONOABS 0.4 05/20/2011 1331   EOSABS 0.1 04/04/2022 1712   EOSABS 0.1 03/29/2019 1046   BASOSABS 0.0 04/04/2022 1712   BASOSABS 0.0 03/29/2019 1046   BASOSABS 0.0 05/20/2011 1331    Chest Imaging: CXR 2019 - clear lungs, mild hyperinflation on my interpretation   Pulmonary Functions Testing Results:    Latest Ref Rng & Units 08/18/2015    9:00 AM  PFT Results  FVC-Pre L 2.43  P  FVC-Predicted Pre % 98  P  FVC-Post L 2.45  P  FVC-Predicted Post % 98  P  Pre FEV1/FVC % % 84  P  Post FEV1/FCV % % 86  P  FEV1-Pre L 2.05  P  FEV1-Predicted Pre % 111  P  FEV1-Post L 2.10  P  DLCO uncorrected ml/min/mmHg 17.47  P  DLCO UNC% % 76  P  DLCO corrected ml/min/mmHg 17.02  P  DLCO COR %Predicted % 74  P  DLVA Predicted % 88  P  TLC L 4.57  P  TLC % Predicted % 93  P  RV % Predicted % 92  P    P Preliminary result  Interpretation: 2017  WNL, moderate fixed obstruction in 2019  Echo diastolic dysfunction 2019 Nuclear stress WNL 2019   Assessment & Plan:   # Mild persistent asthma with superimposed panic attack disorder # Panic attacks worsened by frequent albuterol  and prednisone  Rx; using xanax  has prevented her from needing these meds, using 1/2 tab of 0.25 mg tablet twice a day   Discussion: - Continue symbicort  80/4.5 BID and albuterol  PRN - Continue low dose xanax , recently refilled    Current Outpatient Medications:    acetaminophen  (TYLENOL ) 500 MG tablet, Take by mouth as needed., Disp: , Rfl:    albuterol  (VENTOLIN  HFA) 108 (90 Base) MCG/ACT inhaler, Inhale 2 puffs into the lungs every 6 (six) hours as needed for wheezing or shortness of breath., Disp: 1 each, Rfl: 5   ALPRAZolam  (XANAX ) 0.25 MG tablet, TAKE 1/2 TABLET BY MOUTH TWICE DAILY AS NEEDED FOR ANXIETY, Disp: 90 tablet, Rfl: 0   amiodarone  (PACERONE ) 200 MG tablet, Take 0.5 tablets (100 mg total)  by mouth daily., Disp: 45 tablet, Rfl: 3   amLODipine  (NORVASC ) 10 MG tablet, Take 10 mg by mouth daily., Disp: , Rfl:    azelastine  (ASTELIN ) 0.1 % nasal spray, USE 1 SPRAY IN EACH NOSTRIL TWICE DAILY, Disp: 30 mL, Rfl: 12   busPIRone  (BUSPAR ) 7.5 MG tablet, Take 7.5 mg by mouth 2 (two) times daily., Disp: , Rfl:    cetirizine  (ZYRTEC ) 10 MG tablet, Take 10 mg by mouth daily. , Disp: , Rfl:    Cholecalciferol (VITAMIN D3) 5000 units TABS, 5,000 IU OTC vitamin D3 daily., Disp: 90 tablet, Rfl: 3   ciprofloxacin  (CIPRO ) 500 MG tablet, Take 500 mg by mouth 2 (two) times daily. (Patient not taking: Reported on 09/23/2023), Disp: , Rfl:    Cyanocobalamin  (VITAMIN B-12) 2500 MCG TABS, Take 1,250 mcg by mouth daily. (Patient not taking: Reported on 09/23/2023), Disp: , Rfl:    docusate sodium  (COLACE) 100 MG capsule, Take 100 mg by mouth daily as needed., Disp: , Rfl:    escitalopram  (LEXAPRO ) 10 MG tablet, Take 10 mg by mouth at bedtime., Disp: , Rfl:    fluticasone  (FLONASE ) 50 MCG/ACT nasal spray, USE 2 SPRAYS INTO EACH NOSTRIL EVERY DAY, Disp: 48 g, Rfl: 3   HYDROcodone -acetaminophen  (NORCO/VICODIN) 5-325 MG tablet, Take 1 tablet by mouth 3 (three) times daily as needed., Disp: , Rfl:    losartan  (COZAAR ) 100 MG tablet, Take 1 tablet (100 mg total) by mouth daily., Disp: 90 tablet, Rfl: 3   metoprolol  succinate (TOPROL  XL) 25 MG 24 hr tablet, Take 1 tablet (25 mg total) by mouth daily., Disp: 90 tablet, Rfl: 3   mirabegron ER (MYRBETRIQ) 25 MG TB24 tablet, , Disp: , Rfl:    montelukast  (SINGULAIR ) 10 MG tablet, TAKE 1 TABLET BY MOUTH DAILY AT BEDTIME, Disp: 90 tablet, Rfl: 2   nitrofurantoin , macrocrystal-monohydrate, (MACROBID ) 100 MG capsule, Take 1 capsule (100 mg total) by mouth 2 (two) times daily. (Patient not taking: Reported on 09/23/2023), Disp: 14 capsule, Rfl: 0   omeprazole  (PRILOSEC) 20 MG capsule, TAKE 1 CAPSULE BY MOUTH DAILY, Disp: 90 capsule, Rfl: 1   silver  sulfADIAZINE  (SILVADENE ) 1 %  cream, Apply 1 Application topically daily., Disp: 50 g, Rfl: 0   SYMBICORT  80-4.5 MCG/ACT inhaler, INHALE 2 PUFFS BY MOUTH INTO THE LUNGS TWICE A DAY, Disp: 1 each, Rfl: 11   Vibegron (GEMTESA) 75 MG TABS, Take 75 mg by mouth daily., Disp: , Rfl:    Donnice JONELLE Beals, MD [facility] 09/23/2023 11:19 AM

## 2023-09-24 DIAGNOSIS — M5416 Radiculopathy, lumbar region: Secondary | ICD-10-CM | POA: Diagnosis not present

## 2023-09-24 DIAGNOSIS — M13862 Other specified arthritis, left knee: Secondary | ICD-10-CM | POA: Diagnosis not present

## 2023-09-24 DIAGNOSIS — M13861 Other specified arthritis, right knee: Secondary | ICD-10-CM | POA: Diagnosis not present

## 2023-09-25 DIAGNOSIS — M6281 Muscle weakness (generalized): Secondary | ICD-10-CM | POA: Diagnosis not present

## 2023-09-25 DIAGNOSIS — G8929 Other chronic pain: Secondary | ICD-10-CM | POA: Diagnosis not present

## 2023-09-25 DIAGNOSIS — M25511 Pain in right shoulder: Secondary | ICD-10-CM | POA: Diagnosis not present

## 2023-09-25 DIAGNOSIS — M545 Low back pain, unspecified: Secondary | ICD-10-CM | POA: Diagnosis not present

## 2023-09-30 DIAGNOSIS — M545 Low back pain, unspecified: Secondary | ICD-10-CM | POA: Diagnosis not present

## 2023-09-30 DIAGNOSIS — G8929 Other chronic pain: Secondary | ICD-10-CM | POA: Diagnosis not present

## 2023-09-30 DIAGNOSIS — M6281 Muscle weakness (generalized): Secondary | ICD-10-CM | POA: Diagnosis not present

## 2023-09-30 DIAGNOSIS — M25511 Pain in right shoulder: Secondary | ICD-10-CM | POA: Diagnosis not present

## 2023-10-02 DIAGNOSIS — M25511 Pain in right shoulder: Secondary | ICD-10-CM | POA: Diagnosis not present

## 2023-10-02 DIAGNOSIS — M6281 Muscle weakness (generalized): Secondary | ICD-10-CM | POA: Diagnosis not present

## 2023-10-02 DIAGNOSIS — G8929 Other chronic pain: Secondary | ICD-10-CM | POA: Diagnosis not present

## 2023-10-02 DIAGNOSIS — M545 Low back pain, unspecified: Secondary | ICD-10-CM | POA: Diagnosis not present

## 2023-10-07 DIAGNOSIS — M545 Low back pain, unspecified: Secondary | ICD-10-CM | POA: Diagnosis not present

## 2023-10-07 DIAGNOSIS — G8929 Other chronic pain: Secondary | ICD-10-CM | POA: Diagnosis not present

## 2023-10-07 DIAGNOSIS — M25511 Pain in right shoulder: Secondary | ICD-10-CM | POA: Diagnosis not present

## 2023-10-07 DIAGNOSIS — M6281 Muscle weakness (generalized): Secondary | ICD-10-CM | POA: Diagnosis not present

## 2023-10-09 DIAGNOSIS — M5416 Radiculopathy, lumbar region: Secondary | ICD-10-CM | POA: Diagnosis not present

## 2023-10-14 DIAGNOSIS — G8929 Other chronic pain: Secondary | ICD-10-CM | POA: Diagnosis not present

## 2023-10-14 DIAGNOSIS — M545 Low back pain, unspecified: Secondary | ICD-10-CM | POA: Diagnosis not present

## 2023-10-14 DIAGNOSIS — M6281 Muscle weakness (generalized): Secondary | ICD-10-CM | POA: Diagnosis not present

## 2023-10-14 DIAGNOSIS — M25511 Pain in right shoulder: Secondary | ICD-10-CM | POA: Diagnosis not present

## 2023-10-15 DIAGNOSIS — M170 Bilateral primary osteoarthritis of knee: Secondary | ICD-10-CM | POA: Diagnosis not present

## 2023-10-21 DIAGNOSIS — M6281 Muscle weakness (generalized): Secondary | ICD-10-CM | POA: Diagnosis not present

## 2023-10-21 DIAGNOSIS — G8929 Other chronic pain: Secondary | ICD-10-CM | POA: Diagnosis not present

## 2023-10-21 DIAGNOSIS — M545 Low back pain, unspecified: Secondary | ICD-10-CM | POA: Diagnosis not present

## 2023-10-21 DIAGNOSIS — M25511 Pain in right shoulder: Secondary | ICD-10-CM | POA: Diagnosis not present

## 2023-10-23 DIAGNOSIS — M545 Low back pain, unspecified: Secondary | ICD-10-CM | POA: Diagnosis not present

## 2023-10-23 DIAGNOSIS — G8929 Other chronic pain: Secondary | ICD-10-CM | POA: Diagnosis not present

## 2023-10-23 DIAGNOSIS — M25511 Pain in right shoulder: Secondary | ICD-10-CM | POA: Diagnosis not present

## 2023-10-23 DIAGNOSIS — M6281 Muscle weakness (generalized): Secondary | ICD-10-CM | POA: Diagnosis not present

## 2023-10-24 DIAGNOSIS — M170 Bilateral primary osteoarthritis of knee: Secondary | ICD-10-CM | POA: Diagnosis not present

## 2023-10-28 DIAGNOSIS — G8929 Other chronic pain: Secondary | ICD-10-CM | POA: Diagnosis not present

## 2023-10-28 DIAGNOSIS — M545 Low back pain, unspecified: Secondary | ICD-10-CM | POA: Diagnosis not present

## 2023-10-28 DIAGNOSIS — M25511 Pain in right shoulder: Secondary | ICD-10-CM | POA: Diagnosis not present

## 2023-10-28 DIAGNOSIS — M6281 Muscle weakness (generalized): Secondary | ICD-10-CM | POA: Diagnosis not present

## 2023-10-30 DIAGNOSIS — M170 Bilateral primary osteoarthritis of knee: Secondary | ICD-10-CM | POA: Diagnosis not present

## 2023-11-04 DIAGNOSIS — G8929 Other chronic pain: Secondary | ICD-10-CM | POA: Diagnosis not present

## 2023-11-04 DIAGNOSIS — M6281 Muscle weakness (generalized): Secondary | ICD-10-CM | POA: Diagnosis not present

## 2023-11-04 DIAGNOSIS — M545 Low back pain, unspecified: Secondary | ICD-10-CM | POA: Diagnosis not present

## 2023-11-04 DIAGNOSIS — M25511 Pain in right shoulder: Secondary | ICD-10-CM | POA: Diagnosis not present

## 2023-11-05 DIAGNOSIS — M25512 Pain in left shoulder: Secondary | ICD-10-CM | POA: Diagnosis not present

## 2023-11-06 DIAGNOSIS — M545 Low back pain, unspecified: Secondary | ICD-10-CM | POA: Diagnosis not present

## 2023-11-06 DIAGNOSIS — M25511 Pain in right shoulder: Secondary | ICD-10-CM | POA: Diagnosis not present

## 2023-11-06 DIAGNOSIS — G8929 Other chronic pain: Secondary | ICD-10-CM | POA: Diagnosis not present

## 2023-11-06 DIAGNOSIS — M6281 Muscle weakness (generalized): Secondary | ICD-10-CM | POA: Diagnosis not present

## 2023-11-11 DIAGNOSIS — G8929 Other chronic pain: Secondary | ICD-10-CM | POA: Diagnosis not present

## 2023-11-11 DIAGNOSIS — M6281 Muscle weakness (generalized): Secondary | ICD-10-CM | POA: Diagnosis not present

## 2023-11-11 DIAGNOSIS — M25511 Pain in right shoulder: Secondary | ICD-10-CM | POA: Diagnosis not present

## 2023-11-11 DIAGNOSIS — M545 Low back pain, unspecified: Secondary | ICD-10-CM | POA: Diagnosis not present

## 2023-11-12 DIAGNOSIS — M25512 Pain in left shoulder: Secondary | ICD-10-CM | POA: Diagnosis not present

## 2023-11-13 DIAGNOSIS — G8929 Other chronic pain: Secondary | ICD-10-CM | POA: Diagnosis not present

## 2023-11-13 DIAGNOSIS — M545 Low back pain, unspecified: Secondary | ICD-10-CM | POA: Diagnosis not present

## 2023-11-13 DIAGNOSIS — M25511 Pain in right shoulder: Secondary | ICD-10-CM | POA: Diagnosis not present

## 2023-11-13 DIAGNOSIS — M6281 Muscle weakness (generalized): Secondary | ICD-10-CM | POA: Diagnosis not present

## 2023-11-18 DIAGNOSIS — M6281 Muscle weakness (generalized): Secondary | ICD-10-CM | POA: Diagnosis not present

## 2023-11-18 DIAGNOSIS — M25511 Pain in right shoulder: Secondary | ICD-10-CM | POA: Diagnosis not present

## 2023-11-18 DIAGNOSIS — M545 Low back pain, unspecified: Secondary | ICD-10-CM | POA: Diagnosis not present

## 2023-11-18 DIAGNOSIS — G8929 Other chronic pain: Secondary | ICD-10-CM | POA: Diagnosis not present

## 2023-11-20 DIAGNOSIS — M6281 Muscle weakness (generalized): Secondary | ICD-10-CM | POA: Diagnosis not present

## 2023-11-20 DIAGNOSIS — M545 Low back pain, unspecified: Secondary | ICD-10-CM | POA: Diagnosis not present

## 2023-11-20 DIAGNOSIS — G8929 Other chronic pain: Secondary | ICD-10-CM | POA: Diagnosis not present

## 2023-11-20 DIAGNOSIS — M25511 Pain in right shoulder: Secondary | ICD-10-CM | POA: Diagnosis not present

## 2023-11-25 DIAGNOSIS — M25511 Pain in right shoulder: Secondary | ICD-10-CM | POA: Diagnosis not present

## 2023-11-25 DIAGNOSIS — M6281 Muscle weakness (generalized): Secondary | ICD-10-CM | POA: Diagnosis not present

## 2023-11-25 DIAGNOSIS — G8929 Other chronic pain: Secondary | ICD-10-CM | POA: Diagnosis not present

## 2023-11-25 DIAGNOSIS — M545 Low back pain, unspecified: Secondary | ICD-10-CM | POA: Diagnosis not present

## 2023-11-27 DIAGNOSIS — M25511 Pain in right shoulder: Secondary | ICD-10-CM | POA: Diagnosis not present

## 2023-11-27 DIAGNOSIS — M6281 Muscle weakness (generalized): Secondary | ICD-10-CM | POA: Diagnosis not present

## 2023-11-27 DIAGNOSIS — G8929 Other chronic pain: Secondary | ICD-10-CM | POA: Diagnosis not present

## 2023-11-27 DIAGNOSIS — M545 Low back pain, unspecified: Secondary | ICD-10-CM | POA: Diagnosis not present

## 2023-12-02 DIAGNOSIS — G8929 Other chronic pain: Secondary | ICD-10-CM | POA: Diagnosis not present

## 2023-12-02 DIAGNOSIS — Z1389 Encounter for screening for other disorder: Secondary | ICD-10-CM | POA: Diagnosis not present

## 2023-12-02 DIAGNOSIS — E7849 Other hyperlipidemia: Secondary | ICD-10-CM | POA: Diagnosis not present

## 2023-12-02 DIAGNOSIS — M545 Low back pain, unspecified: Secondary | ICD-10-CM | POA: Diagnosis not present

## 2023-12-02 DIAGNOSIS — E538 Deficiency of other specified B group vitamins: Secondary | ICD-10-CM | POA: Diagnosis not present

## 2023-12-02 DIAGNOSIS — E559 Vitamin D deficiency, unspecified: Secondary | ICD-10-CM | POA: Diagnosis not present

## 2023-12-02 DIAGNOSIS — E785 Hyperlipidemia, unspecified: Secondary | ICD-10-CM | POA: Diagnosis not present

## 2023-12-02 DIAGNOSIS — M6281 Muscle weakness (generalized): Secondary | ICD-10-CM | POA: Diagnosis not present

## 2023-12-02 DIAGNOSIS — I700 Atherosclerosis of aorta: Secondary | ICD-10-CM | POA: Diagnosis not present

## 2023-12-02 DIAGNOSIS — Z0000 Encounter for general adult medical examination without abnormal findings: Secondary | ICD-10-CM | POA: Diagnosis not present

## 2023-12-02 DIAGNOSIS — M25511 Pain in right shoulder: Secondary | ICD-10-CM | POA: Diagnosis not present

## 2023-12-02 DIAGNOSIS — R7303 Prediabetes: Secondary | ICD-10-CM | POA: Diagnosis not present

## 2023-12-04 DIAGNOSIS — G8929 Other chronic pain: Secondary | ICD-10-CM | POA: Diagnosis not present

## 2023-12-04 DIAGNOSIS — M6281 Muscle weakness (generalized): Secondary | ICD-10-CM | POA: Diagnosis not present

## 2023-12-04 DIAGNOSIS — M545 Low back pain, unspecified: Secondary | ICD-10-CM | POA: Diagnosis not present

## 2023-12-04 DIAGNOSIS — M25511 Pain in right shoulder: Secondary | ICD-10-CM | POA: Diagnosis not present

## 2023-12-08 DIAGNOSIS — M6281 Muscle weakness (generalized): Secondary | ICD-10-CM | POA: Diagnosis not present

## 2023-12-08 DIAGNOSIS — M545 Low back pain, unspecified: Secondary | ICD-10-CM | POA: Diagnosis not present

## 2023-12-08 DIAGNOSIS — G8929 Other chronic pain: Secondary | ICD-10-CM | POA: Diagnosis not present

## 2023-12-08 DIAGNOSIS — M25511 Pain in right shoulder: Secondary | ICD-10-CM | POA: Diagnosis not present

## 2023-12-09 DIAGNOSIS — I5020 Unspecified systolic (congestive) heart failure: Secondary | ICD-10-CM | POA: Diagnosis not present

## 2023-12-09 DIAGNOSIS — N182 Chronic kidney disease, stage 2 (mild): Secondary | ICD-10-CM | POA: Diagnosis not present

## 2023-12-09 DIAGNOSIS — M797 Fibromyalgia: Secondary | ICD-10-CM | POA: Diagnosis not present

## 2023-12-09 DIAGNOSIS — I4729 Other ventricular tachycardia: Secondary | ICD-10-CM | POA: Diagnosis not present

## 2023-12-09 DIAGNOSIS — G3184 Mild cognitive impairment, so stated: Secondary | ICD-10-CM | POA: Diagnosis not present

## 2023-12-09 DIAGNOSIS — Z1339 Encounter for screening examination for other mental health and behavioral disorders: Secondary | ICD-10-CM | POA: Diagnosis not present

## 2023-12-09 DIAGNOSIS — I700 Atherosclerosis of aorta: Secondary | ICD-10-CM | POA: Diagnosis not present

## 2023-12-09 DIAGNOSIS — Z87891 Personal history of nicotine dependence: Secondary | ICD-10-CM | POA: Diagnosis not present

## 2023-12-09 DIAGNOSIS — Z1331 Encounter for screening for depression: Secondary | ICD-10-CM | POA: Diagnosis not present

## 2023-12-09 DIAGNOSIS — I130 Hypertensive heart and chronic kidney disease with heart failure and stage 1 through stage 4 chronic kidney disease, or unspecified chronic kidney disease: Secondary | ICD-10-CM | POA: Diagnosis not present

## 2023-12-09 DIAGNOSIS — E785 Hyperlipidemia, unspecified: Secondary | ICD-10-CM | POA: Diagnosis not present

## 2023-12-09 DIAGNOSIS — I471 Supraventricular tachycardia, unspecified: Secondary | ICD-10-CM | POA: Diagnosis not present

## 2023-12-09 DIAGNOSIS — Z23 Encounter for immunization: Secondary | ICD-10-CM | POA: Diagnosis not present

## 2023-12-09 DIAGNOSIS — G729 Myopathy, unspecified: Secondary | ICD-10-CM | POA: Diagnosis not present

## 2023-12-09 DIAGNOSIS — Z0000 Encounter for general adult medical examination without abnormal findings: Secondary | ICD-10-CM | POA: Diagnosis not present

## 2023-12-10 DIAGNOSIS — N3942 Incontinence without sensory awareness: Secondary | ICD-10-CM | POA: Diagnosis not present

## 2023-12-10 DIAGNOSIS — R350 Frequency of micturition: Secondary | ICD-10-CM | POA: Diagnosis not present

## 2023-12-11 DIAGNOSIS — M6281 Muscle weakness (generalized): Secondary | ICD-10-CM | POA: Diagnosis not present

## 2023-12-11 DIAGNOSIS — G8929 Other chronic pain: Secondary | ICD-10-CM | POA: Diagnosis not present

## 2023-12-11 DIAGNOSIS — M25511 Pain in right shoulder: Secondary | ICD-10-CM | POA: Diagnosis not present

## 2023-12-11 DIAGNOSIS — M545 Low back pain, unspecified: Secondary | ICD-10-CM | POA: Diagnosis not present

## 2023-12-16 DIAGNOSIS — M545 Low back pain, unspecified: Secondary | ICD-10-CM | POA: Diagnosis not present

## 2023-12-16 DIAGNOSIS — M6281 Muscle weakness (generalized): Secondary | ICD-10-CM | POA: Diagnosis not present

## 2023-12-16 DIAGNOSIS — M25511 Pain in right shoulder: Secondary | ICD-10-CM | POA: Diagnosis not present

## 2023-12-16 DIAGNOSIS — G8929 Other chronic pain: Secondary | ICD-10-CM | POA: Diagnosis not present

## 2023-12-17 DIAGNOSIS — F419 Anxiety disorder, unspecified: Secondary | ICD-10-CM | POA: Diagnosis not present

## 2023-12-17 DIAGNOSIS — I130 Hypertensive heart and chronic kidney disease with heart failure and stage 1 through stage 4 chronic kidney disease, or unspecified chronic kidney disease: Secondary | ICD-10-CM | POA: Diagnosis not present

## 2023-12-17 DIAGNOSIS — J4530 Mild persistent asthma, uncomplicated: Secondary | ICD-10-CM | POA: Diagnosis not present

## 2023-12-17 DIAGNOSIS — F331 Major depressive disorder, recurrent, moderate: Secondary | ICD-10-CM | POA: Diagnosis not present

## 2023-12-17 DIAGNOSIS — I4729 Other ventricular tachycardia: Secondary | ICD-10-CM | POA: Diagnosis not present

## 2023-12-18 DIAGNOSIS — G8929 Other chronic pain: Secondary | ICD-10-CM | POA: Diagnosis not present

## 2023-12-18 DIAGNOSIS — M545 Low back pain, unspecified: Secondary | ICD-10-CM | POA: Diagnosis not present

## 2023-12-18 DIAGNOSIS — M25511 Pain in right shoulder: Secondary | ICD-10-CM | POA: Diagnosis not present

## 2023-12-18 DIAGNOSIS — M6281 Muscle weakness (generalized): Secondary | ICD-10-CM | POA: Diagnosis not present

## 2023-12-23 DIAGNOSIS — M25511 Pain in right shoulder: Secondary | ICD-10-CM | POA: Diagnosis not present

## 2023-12-23 DIAGNOSIS — M6281 Muscle weakness (generalized): Secondary | ICD-10-CM | POA: Diagnosis not present

## 2023-12-23 DIAGNOSIS — G8929 Other chronic pain: Secondary | ICD-10-CM | POA: Diagnosis not present

## 2023-12-23 DIAGNOSIS — M545 Low back pain, unspecified: Secondary | ICD-10-CM | POA: Diagnosis not present

## 2023-12-30 DIAGNOSIS — G8929 Other chronic pain: Secondary | ICD-10-CM | POA: Diagnosis not present

## 2023-12-30 DIAGNOSIS — M545 Low back pain, unspecified: Secondary | ICD-10-CM | POA: Diagnosis not present

## 2023-12-30 DIAGNOSIS — M6281 Muscle weakness (generalized): Secondary | ICD-10-CM | POA: Diagnosis not present

## 2023-12-30 DIAGNOSIS — M25511 Pain in right shoulder: Secondary | ICD-10-CM | POA: Diagnosis not present

## 2024-01-01 DIAGNOSIS — M545 Low back pain, unspecified: Secondary | ICD-10-CM | POA: Diagnosis not present

## 2024-01-01 DIAGNOSIS — M25511 Pain in right shoulder: Secondary | ICD-10-CM | POA: Diagnosis not present

## 2024-01-01 DIAGNOSIS — G8929 Other chronic pain: Secondary | ICD-10-CM | POA: Diagnosis not present

## 2024-01-01 DIAGNOSIS — M6281 Muscle weakness (generalized): Secondary | ICD-10-CM | POA: Diagnosis not present

## 2024-01-06 DIAGNOSIS — M545 Low back pain, unspecified: Secondary | ICD-10-CM | POA: Diagnosis not present

## 2024-01-06 DIAGNOSIS — M25511 Pain in right shoulder: Secondary | ICD-10-CM | POA: Diagnosis not present

## 2024-01-06 DIAGNOSIS — G8929 Other chronic pain: Secondary | ICD-10-CM | POA: Diagnosis not present

## 2024-01-06 DIAGNOSIS — M6281 Muscle weakness (generalized): Secondary | ICD-10-CM | POA: Diagnosis not present

## 2024-01-08 DIAGNOSIS — G8929 Other chronic pain: Secondary | ICD-10-CM | POA: Diagnosis not present

## 2024-01-08 DIAGNOSIS — M25511 Pain in right shoulder: Secondary | ICD-10-CM | POA: Diagnosis not present

## 2024-01-08 DIAGNOSIS — M545 Low back pain, unspecified: Secondary | ICD-10-CM | POA: Diagnosis not present

## 2024-01-08 DIAGNOSIS — M6281 Muscle weakness (generalized): Secondary | ICD-10-CM | POA: Diagnosis not present

## 2024-01-13 DIAGNOSIS — M6281 Muscle weakness (generalized): Secondary | ICD-10-CM | POA: Diagnosis not present

## 2024-01-13 DIAGNOSIS — M25511 Pain in right shoulder: Secondary | ICD-10-CM | POA: Diagnosis not present

## 2024-01-13 DIAGNOSIS — G8929 Other chronic pain: Secondary | ICD-10-CM | POA: Diagnosis not present

## 2024-01-13 DIAGNOSIS — M545 Low back pain, unspecified: Secondary | ICD-10-CM | POA: Diagnosis not present

## 2024-01-16 DIAGNOSIS — Z23 Encounter for immunization: Secondary | ICD-10-CM | POA: Diagnosis not present

## 2024-01-20 DIAGNOSIS — M545 Low back pain, unspecified: Secondary | ICD-10-CM | POA: Diagnosis not present

## 2024-01-20 DIAGNOSIS — M25511 Pain in right shoulder: Secondary | ICD-10-CM | POA: Diagnosis not present

## 2024-01-20 DIAGNOSIS — G8929 Other chronic pain: Secondary | ICD-10-CM | POA: Diagnosis not present

## 2024-01-20 DIAGNOSIS — M6281 Muscle weakness (generalized): Secondary | ICD-10-CM | POA: Diagnosis not present

## 2024-01-22 DIAGNOSIS — M6281 Muscle weakness (generalized): Secondary | ICD-10-CM | POA: Diagnosis not present

## 2024-01-22 DIAGNOSIS — G8929 Other chronic pain: Secondary | ICD-10-CM | POA: Diagnosis not present

## 2024-01-22 DIAGNOSIS — M25511 Pain in right shoulder: Secondary | ICD-10-CM | POA: Diagnosis not present

## 2024-01-22 DIAGNOSIS — M545 Low back pain, unspecified: Secondary | ICD-10-CM | POA: Diagnosis not present

## 2024-01-26 DIAGNOSIS — M19012 Primary osteoarthritis, left shoulder: Secondary | ICD-10-CM | POA: Diagnosis not present

## 2024-01-26 DIAGNOSIS — M549 Dorsalgia, unspecified: Secondary | ICD-10-CM | POA: Diagnosis not present

## 2024-01-26 DIAGNOSIS — M5136 Other intervertebral disc degeneration, lumbar region with discogenic back pain only: Secondary | ICD-10-CM | POA: Diagnosis not present

## 2024-01-26 DIAGNOSIS — M47816 Spondylosis without myelopathy or radiculopathy, lumbar region: Secondary | ICD-10-CM | POA: Diagnosis not present

## 2024-02-04 DIAGNOSIS — M25512 Pain in left shoulder: Secondary | ICD-10-CM | POA: Diagnosis not present

## 2024-02-04 DIAGNOSIS — M25511 Pain in right shoulder: Secondary | ICD-10-CM | POA: Diagnosis not present

## 2024-02-09 DIAGNOSIS — H40013 Open angle with borderline findings, low risk, bilateral: Secondary | ICD-10-CM | POA: Diagnosis not present

## 2024-02-09 DIAGNOSIS — H04123 Dry eye syndrome of bilateral lacrimal glands: Secondary | ICD-10-CM | POA: Diagnosis not present

## 2024-02-09 DIAGNOSIS — H01024 Squamous blepharitis left upper eyelid: Secondary | ICD-10-CM | POA: Diagnosis not present

## 2024-02-09 DIAGNOSIS — Z961 Presence of intraocular lens: Secondary | ICD-10-CM | POA: Diagnosis not present

## 2024-02-09 DIAGNOSIS — H01021 Squamous blepharitis right upper eyelid: Secondary | ICD-10-CM | POA: Diagnosis not present

## 2024-02-17 DIAGNOSIS — M5416 Radiculopathy, lumbar region: Secondary | ICD-10-CM | POA: Diagnosis not present

## 2024-03-03 ENCOUNTER — Other Ambulatory Visit: Payer: Self-pay | Admitting: Pulmonary Disease

## 2024-03-05 DIAGNOSIS — R351 Nocturia: Secondary | ICD-10-CM | POA: Diagnosis not present

## 2024-03-05 DIAGNOSIS — R350 Frequency of micturition: Secondary | ICD-10-CM | POA: Diagnosis not present

## 2024-03-05 DIAGNOSIS — N3281 Overactive bladder: Secondary | ICD-10-CM | POA: Diagnosis not present

## 2024-03-22 ENCOUNTER — Other Ambulatory Visit: Payer: Self-pay | Admitting: Cardiology

## 2024-05-12 ENCOUNTER — Ambulatory Visit: Admitting: Pulmonary Disease

## 2024-07-08 ENCOUNTER — Ambulatory Visit: Admitting: Cardiovascular Disease
